# Patient Record
Sex: Male | Born: 1935 | Race: White | Hispanic: No | Marital: Single | Smoking: Never smoker
Health system: Southern US, Community
[De-identification: ages and names within clinical notes are randomized; demographics above are authoritative.]

## PROBLEM LIST (undated history)

## (undated) ENCOUNTER — Emergency Department (HOSPITAL_COMMUNITY): Payer: Medicare Other | Source: Home / Self Care

## (undated) DIAGNOSIS — K759 Inflammatory liver disease, unspecified: Secondary | ICD-10-CM

## (undated) DIAGNOSIS — C61 Malignant neoplasm of prostate: Secondary | ICD-10-CM

## (undated) DIAGNOSIS — E46 Unspecified protein-calorie malnutrition: Secondary | ICD-10-CM

## (undated) DIAGNOSIS — C662 Malignant neoplasm of left ureter: Secondary | ICD-10-CM

## (undated) DIAGNOSIS — Z96649 Presence of unspecified artificial hip joint: Secondary | ICD-10-CM

## (undated) DIAGNOSIS — K5790 Diverticulosis of intestine, part unspecified, without perforation or abscess without bleeding: Secondary | ICD-10-CM

## (undated) DIAGNOSIS — K635 Polyp of colon: Secondary | ICD-10-CM

## (undated) DIAGNOSIS — M1990 Unspecified osteoarthritis, unspecified site: Secondary | ICD-10-CM

## (undated) DIAGNOSIS — I700 Atherosclerosis of aorta: Secondary | ICD-10-CM

## (undated) DIAGNOSIS — I10 Essential (primary) hypertension: Secondary | ICD-10-CM

## (undated) DIAGNOSIS — N471 Phimosis: Secondary | ICD-10-CM

## (undated) DIAGNOSIS — N179 Acute kidney failure, unspecified: Secondary | ICD-10-CM

## (undated) DIAGNOSIS — I6529 Occlusion and stenosis of unspecified carotid artery: Secondary | ICD-10-CM

## (undated) DIAGNOSIS — E785 Hyperlipidemia, unspecified: Secondary | ICD-10-CM

## (undated) DIAGNOSIS — J439 Emphysema, unspecified: Secondary | ICD-10-CM

## (undated) DIAGNOSIS — R200 Anesthesia of skin: Secondary | ICD-10-CM

## (undated) HISTORY — DX: Phimosis: N47.1

## (undated) HISTORY — PX: OTHER SURGICAL HISTORY: SHX169

## (undated) HISTORY — DX: Essential (primary) hypertension: I10

## (undated) HISTORY — DX: Atherosclerosis of aorta: I70.0

## (undated) HISTORY — DX: Unspecified protein-calorie malnutrition: E46

## (undated) HISTORY — DX: Polyp of colon: K63.5

## (undated) HISTORY — DX: Emphysema, unspecified: J43.9

## (undated) HISTORY — PX: CATARACT EXTRACTION W/ INTRAOCULAR LENS  IMPLANT, BILATERAL: SHX1307

## (undated) HISTORY — DX: Acute kidney failure, unspecified: N17.9

## (undated) HISTORY — DX: Occlusion and stenosis of unspecified carotid artery: I65.29

## (undated) HISTORY — DX: Diverticulosis of intestine, part unspecified, without perforation or abscess without bleeding: K57.90

## (undated) HISTORY — PX: PROSTATE SURGERY: SHX751

## (undated) HISTORY — PX: URINARY SPHINCTER IMPLANT: SHX2624

## (undated) HISTORY — PX: VARICOSE VEIN SURGERY: SHX832

## (undated) HISTORY — PX: SKIN CANCER EXCISION: SHX779

## (undated) HISTORY — DX: Presence of unspecified artificial hip joint: Z96.649

---

## 1958-04-12 HISTORY — PX: INGUINAL HERNIA REPAIR: SUR1180

## 1999-04-13 DIAGNOSIS — C61 Malignant neoplasm of prostate: Secondary | ICD-10-CM

## 1999-04-13 HISTORY — DX: Malignant neoplasm of prostate: C61

## 2005-02-23 ENCOUNTER — Ambulatory Visit: Payer: Self-pay | Admitting: Internal Medicine

## 2005-03-01 ENCOUNTER — Ambulatory Visit: Payer: Self-pay | Admitting: Internal Medicine

## 2005-03-16 ENCOUNTER — Ambulatory Visit: Payer: Self-pay | Admitting: Internal Medicine

## 2005-08-10 ENCOUNTER — Ambulatory Visit: Payer: Self-pay | Admitting: Internal Medicine

## 2005-08-28 ENCOUNTER — Ambulatory Visit: Payer: Self-pay | Admitting: Internal Medicine

## 2005-08-31 ENCOUNTER — Ambulatory Visit: Payer: Self-pay | Admitting: Internal Medicine

## 2005-09-03 ENCOUNTER — Ambulatory Visit: Payer: Self-pay | Admitting: Internal Medicine

## 2005-10-04 ENCOUNTER — Ambulatory Visit: Payer: Self-pay | Admitting: Internal Medicine

## 2005-12-03 ENCOUNTER — Ambulatory Visit: Payer: Self-pay | Admitting: Internal Medicine

## 2006-02-08 ENCOUNTER — Ambulatory Visit: Payer: Self-pay | Admitting: Internal Medicine

## 2006-02-14 ENCOUNTER — Ambulatory Visit: Payer: Self-pay | Admitting: Internal Medicine

## 2006-03-09 ENCOUNTER — Ambulatory Visit: Payer: Self-pay | Admitting: Cardiovascular Disease

## 2006-07-18 ENCOUNTER — Ambulatory Visit: Payer: Self-pay | Admitting: Internal Medicine

## 2006-09-07 ENCOUNTER — Ambulatory Visit: Payer: Self-pay | Admitting: Internal Medicine

## 2006-11-03 ENCOUNTER — Encounter: Payer: Self-pay | Admitting: Internal Medicine

## 2006-11-04 DIAGNOSIS — I868 Varicose veins of other specified sites: Secondary | ICD-10-CM

## 2006-11-04 DIAGNOSIS — Z8546 Personal history of malignant neoplasm of prostate: Secondary | ICD-10-CM

## 2006-11-04 DIAGNOSIS — Z87898 Personal history of other specified conditions: Secondary | ICD-10-CM | POA: Insufficient documentation

## 2006-11-04 DIAGNOSIS — N318 Other neuromuscular dysfunction of bladder: Secondary | ICD-10-CM

## 2006-11-04 HISTORY — DX: Personal history of malignant neoplasm of prostate: Z85.46

## 2006-11-15 ENCOUNTER — Ambulatory Visit: Payer: Self-pay | Admitting: Internal Medicine

## 2006-11-16 ENCOUNTER — Ambulatory Visit: Payer: Self-pay | Admitting: Internal Medicine

## 2006-11-16 LAB — CONVERTED CEMR LAB
ALT: 16 units/L (ref 0–53)
CO2: 30 meq/L (ref 19–32)
Calcium: 9.3 mg/dL (ref 8.4–10.5)
Chloride: 107 meq/L (ref 96–112)
Cholesterol: 197 mg/dL (ref 0–200)
Creatinine, Ser: 1.1 mg/dL (ref 0.4–1.5)
Glucose, Bld: 105 mg/dL — ABNORMAL HIGH (ref 70–99)
LDL Cholesterol: 135 mg/dL — ABNORMAL HIGH (ref 0–99)
Potassium: 4.3 meq/L (ref 3.5–5.1)
Sodium: 142 meq/L (ref 135–145)
Total CHOL/HDL Ratio: 4.7

## 2006-11-25 ENCOUNTER — Ambulatory Visit: Payer: Self-pay | Admitting: Internal Medicine

## 2006-12-06 ENCOUNTER — Ambulatory Visit: Payer: Self-pay | Admitting: Internal Medicine

## 2006-12-06 LAB — CONVERTED CEMR LAB
BUN: 13 mg/dL (ref 6–23)
CO2: 32 meq/L (ref 19–32)
Calcium: 9.5 mg/dL (ref 8.4–10.5)
Creatinine, Ser: 1.3 mg/dL (ref 0.4–1.5)
GFR calc Af Amer: 69 mL/min
Glucose, Bld: 93 mg/dL (ref 70–99)
Potassium: 4.3 meq/L (ref 3.5–5.1)

## 2007-01-12 ENCOUNTER — Ambulatory Visit: Payer: Self-pay | Admitting: Internal Medicine

## 2007-01-12 LAB — CONVERTED CEMR LAB
CO2: 34 meq/L — ABNORMAL HIGH (ref 19–32)
Calcium: 9.4 mg/dL (ref 8.4–10.5)
Chloride: 106 meq/L (ref 96–112)
GFR calc Af Amer: 69 mL/min
Glucose, Bld: 118 mg/dL — ABNORMAL HIGH (ref 70–99)
Sodium: 144 meq/L (ref 135–145)

## 2007-02-08 ENCOUNTER — Encounter: Payer: Self-pay | Admitting: Internal Medicine

## 2007-02-08 DIAGNOSIS — I10 Essential (primary) hypertension: Secondary | ICD-10-CM

## 2007-02-09 ENCOUNTER — Ambulatory Visit: Payer: Self-pay | Admitting: Internal Medicine

## 2007-02-10 ENCOUNTER — Encounter: Payer: Self-pay | Admitting: Internal Medicine

## 2007-02-14 ENCOUNTER — Telehealth: Payer: Self-pay | Admitting: Internal Medicine

## 2007-07-26 ENCOUNTER — Encounter: Payer: Self-pay | Admitting: Internal Medicine

## 2007-08-28 ENCOUNTER — Ambulatory Visit: Payer: Self-pay | Admitting: Internal Medicine

## 2007-08-28 DIAGNOSIS — Z860100 Personal history of colon polyps, unspecified: Secondary | ICD-10-CM

## 2007-08-28 HISTORY — DX: Personal history of colonic polyps: Z86.010

## 2007-08-28 HISTORY — DX: Personal history of colon polyps, unspecified: Z86.0100

## 2007-09-01 ENCOUNTER — Telehealth: Payer: Self-pay | Admitting: Internal Medicine

## 2007-09-05 ENCOUNTER — Ambulatory Visit: Payer: Self-pay | Admitting: Internal Medicine

## 2007-09-05 DIAGNOSIS — I83893 Varicose veins of bilateral lower extremities with other complications: Secondary | ICD-10-CM

## 2007-09-05 HISTORY — DX: Varicose veins of bilateral lower extremities with other complications: I83.893

## 2007-09-13 ENCOUNTER — Ambulatory Visit: Payer: Self-pay | Admitting: Internal Medicine

## 2007-09-15 ENCOUNTER — Ambulatory Visit: Payer: Self-pay | Admitting: Internal Medicine

## 2007-09-22 ENCOUNTER — Ambulatory Visit: Payer: Self-pay | Admitting: Internal Medicine

## 2007-09-22 LAB — CONVERTED CEMR LAB
HDL: 44.1 mg/dL (ref >39.0)
Potassium: 4.4 meq/L (ref 3.5–5.1)
Sodium: 140 meq/L (ref 135–145)
Total CHOL/HDL Ratio: 4.3
VLDL: 16 mg/dL (ref 0–40)

## 2007-09-27 ENCOUNTER — Encounter: Payer: Self-pay | Admitting: Internal Medicine

## 2007-09-28 ENCOUNTER — Ambulatory Visit: Payer: Self-pay | Admitting: Internal Medicine

## 2007-10-30 ENCOUNTER — Ambulatory Visit: Payer: Self-pay | Admitting: Internal Medicine

## 2007-10-30 DIAGNOSIS — R32 Unspecified urinary incontinence: Secondary | ICD-10-CM | POA: Insufficient documentation

## 2007-11-03 ENCOUNTER — Telehealth: Payer: Self-pay | Admitting: Internal Medicine

## 2007-12-04 ENCOUNTER — Ambulatory Visit: Payer: Self-pay | Admitting: Internal Medicine

## 2007-12-04 DIAGNOSIS — R209 Unspecified disturbances of skin sensation: Secondary | ICD-10-CM

## 2007-12-04 HISTORY — DX: Unspecified disturbances of skin sensation: R20.9

## 2007-12-04 LAB — CONVERTED CEMR LAB
Folate: >20 ng/mL
TSH: 0.58 microintl units/mL (ref 0.35–5.50)

## 2007-12-05 ENCOUNTER — Telehealth: Payer: Self-pay | Admitting: Internal Medicine

## 2008-03-11 ENCOUNTER — Ambulatory Visit: Payer: Self-pay | Admitting: Internal Medicine

## 2008-03-11 DIAGNOSIS — N476 Balanoposthitis: Secondary | ICD-10-CM | POA: Insufficient documentation

## 2008-03-11 DIAGNOSIS — N471 Phimosis: Secondary | ICD-10-CM

## 2008-03-11 DIAGNOSIS — N478 Other disorders of prepuce: Secondary | ICD-10-CM

## 2008-09-20 ENCOUNTER — Ambulatory Visit: Payer: Self-pay | Admitting: Internal Medicine

## 2008-09-23 ENCOUNTER — Ambulatory Visit: Payer: Self-pay | Admitting: Internal Medicine

## 2008-09-23 LAB — CONVERTED CEMR LAB
Alkaline Phosphatase: 55 units/L (ref 39–117)
BUN: 14 mg/dL (ref 6–23)
Basophils Absolute: 0.1 10*3/uL (ref 0.0–0.1)
Bilirubin, Direct: 0.2 mg/dL (ref 0.0–0.3)
CO2: 32 meq/L (ref 19–32)
Chloride: 109 meq/L (ref 96–112)
Cholesterol: 170 mg/dL (ref 0–200)
Eosinophils Absolute: 0.4 10*3/uL (ref 0.0–0.7)
Eosinophils Relative: 6 % — ABNORMAL HIGH (ref 0.0–5.0)
GFR calc non Af Amer: 68.51 mL/min (ref >60)
Glucose, Bld: 103 mg/dL — ABNORMAL HIGH (ref 70–99)
HCT: 40.8 % (ref 39.0–52.0)
Hemoglobin: 14.3 g/dL (ref 13.0–17.0)
LDL Cholesterol: 109 mg/dL — ABNORMAL HIGH (ref 0–99)
Lymphocytes Relative: 24.1 % (ref 12.0–46.0)
Lymphs Abs: 1.6 10*3/uL (ref 0.7–4.0)
MCHC: 35.1 g/dL (ref 30.0–36.0)
MCV: 95.2 fL (ref 78.0–100.0)
Monocytes Absolute: 0.3 10*3/uL (ref 0.1–1.0)
Neutro Abs: 4.4 10*3/uL (ref 1.4–7.7)
PSA: 0.01 ng/mL — ABNORMAL LOW (ref 0.10–4.00)
RBC: 4.28 M/uL (ref 4.22–5.81)
Sodium: 143 meq/L (ref 135–145)
Total Bilirubin: 1 mg/dL (ref 0.3–1.2)
Total CHOL/HDL Ratio: 4

## 2008-09-27 ENCOUNTER — Encounter: Payer: Self-pay | Admitting: Internal Medicine

## 2008-10-03 ENCOUNTER — Ambulatory Visit: Payer: Self-pay | Admitting: Internal Medicine

## 2008-10-17 ENCOUNTER — Ambulatory Visit: Payer: Self-pay | Admitting: Internal Medicine

## 2008-10-17 DIAGNOSIS — H269 Unspecified cataract: Secondary | ICD-10-CM

## 2008-10-17 HISTORY — DX: Unspecified cataract: H26.9

## 2008-11-10 DIAGNOSIS — I6529 Occlusion and stenosis of unspecified carotid artery: Secondary | ICD-10-CM

## 2008-11-10 HISTORY — DX: Occlusion and stenosis of unspecified carotid artery: I65.29

## 2008-11-19 ENCOUNTER — Ambulatory Visit: Payer: Self-pay | Admitting: Internal Medicine

## 2008-11-19 DIAGNOSIS — R0989 Other specified symptoms and signs involving the circulatory and respiratory systems: Secondary | ICD-10-CM

## 2008-11-19 HISTORY — DX: Other specified symptoms and signs involving the circulatory and respiratory systems: R09.89

## 2008-11-22 ENCOUNTER — Encounter: Payer: Self-pay | Admitting: Internal Medicine

## 2008-11-22 ENCOUNTER — Ambulatory Visit: Payer: Self-pay

## 2008-11-27 ENCOUNTER — Telehealth: Payer: Self-pay | Admitting: Internal Medicine

## 2008-11-28 ENCOUNTER — Ambulatory Visit: Payer: Self-pay | Admitting: Internal Medicine

## 2008-11-28 DIAGNOSIS — E785 Hyperlipidemia, unspecified: Secondary | ICD-10-CM

## 2008-12-31 ENCOUNTER — Encounter: Payer: Self-pay | Admitting: Internal Medicine

## 2009-02-25 ENCOUNTER — Encounter: Payer: Self-pay | Admitting: Internal Medicine

## 2009-06-10 ENCOUNTER — Encounter: Payer: Self-pay | Admitting: Internal Medicine

## 2009-07-11 ENCOUNTER — Ambulatory Visit: Payer: Self-pay | Admitting: Internal Medicine

## 2009-07-11 DIAGNOSIS — I499 Cardiac arrhythmia, unspecified: Secondary | ICD-10-CM

## 2009-07-11 DIAGNOSIS — L259 Unspecified contact dermatitis, unspecified cause: Secondary | ICD-10-CM | POA: Insufficient documentation

## 2009-07-11 HISTORY — DX: Cardiac arrhythmia, unspecified: I49.9

## 2009-07-21 ENCOUNTER — Encounter (INDEPENDENT_AMBULATORY_CARE_PROVIDER_SITE_OTHER): Payer: Self-pay | Admitting: *Deleted

## 2009-07-30 ENCOUNTER — Telehealth: Payer: Self-pay | Admitting: Internal Medicine

## 2009-07-31 ENCOUNTER — Ambulatory Visit: Payer: Self-pay

## 2009-07-31 ENCOUNTER — Ambulatory Visit (HOSPITAL_COMMUNITY): Admission: RE | Admit: 2009-07-31 | Discharge: 2009-07-31 | Payer: Self-pay | Admitting: Internal Medicine

## 2009-07-31 ENCOUNTER — Encounter: Payer: Self-pay | Admitting: Internal Medicine

## 2009-07-31 ENCOUNTER — Ambulatory Visit: Payer: Self-pay | Admitting: Internal Medicine

## 2009-07-31 ENCOUNTER — Ambulatory Visit: Payer: Self-pay | Admitting: Cardiology

## 2009-07-31 LAB — CONVERTED CEMR LAB
ALT: 28 units/L (ref 0–53)
Alkaline Phosphatase: 46 units/L (ref 39–117)
BUN: 15 mg/dL (ref 6–23)
Bilirubin, Direct: 0.2 mg/dL (ref 0.0–0.3)
Calcium: 9.4 mg/dL (ref 8.4–10.5)
Cholesterol: 118 mg/dL (ref 0–200)
GFR calc non Af Amer: 51.76 mL/min (ref >60)
LDL Cholesterol: 41 mg/dL (ref 0–99)
Potassium: 5 meq/L (ref 3.5–5.1)
Total CHOL/HDL Ratio: 2
Total Protein: 6.6 g/dL (ref 6.0–8.3)
VLDL: 27 mg/dL (ref 0.0–40.0)

## 2009-08-03 ENCOUNTER — Telehealth: Payer: Self-pay | Admitting: Internal Medicine

## 2009-08-05 ENCOUNTER — Ambulatory Visit: Payer: Self-pay | Admitting: Internal Medicine

## 2009-08-05 DIAGNOSIS — R946 Abnormal results of thyroid function studies: Secondary | ICD-10-CM

## 2009-08-05 HISTORY — DX: Abnormal results of thyroid function studies: R94.6

## 2009-08-21 ENCOUNTER — Encounter: Payer: Self-pay | Admitting: Internal Medicine

## 2009-09-12 ENCOUNTER — Encounter: Payer: Self-pay | Admitting: Internal Medicine

## 2009-09-22 ENCOUNTER — Ambulatory Visit: Payer: Self-pay | Admitting: Internal Medicine

## 2009-09-24 ENCOUNTER — Telehealth: Payer: Self-pay | Admitting: Internal Medicine

## 2009-09-24 ENCOUNTER — Ambulatory Visit (HOSPITAL_COMMUNITY): Admission: RE | Admit: 2009-09-24 | Discharge: 2009-09-24 | Payer: Self-pay | Admitting: Urology

## 2009-09-29 ENCOUNTER — Ambulatory Visit: Payer: Self-pay | Admitting: Internal Medicine

## 2009-09-29 DIAGNOSIS — L03039 Cellulitis of unspecified toe: Secondary | ICD-10-CM | POA: Insufficient documentation

## 2009-10-09 ENCOUNTER — Encounter: Payer: Self-pay | Admitting: Internal Medicine

## 2009-11-06 ENCOUNTER — Ambulatory Visit: Payer: Self-pay | Admitting: Internal Medicine

## 2010-01-27 ENCOUNTER — Ambulatory Visit: Payer: Self-pay | Admitting: Internal Medicine

## 2010-03-10 ENCOUNTER — Encounter: Payer: Self-pay | Admitting: Internal Medicine

## 2010-05-12 NOTE — Letter (Signed)
Summary: [facility]  [facility]   Imported By: Lanelle Bal 09/19/2009 13:11:22  _____________________________________________________________________  External Attachment:    Type:   Image     Comment:   External Document

## 2010-05-12 NOTE — Assessment & Plan Note (Signed)
Summary: 3 month fu/dt   Vital Signs:  Patient profile:   77 year old male Height:      70 inches Weight:      135 pounds BMI:     19.44 O2 Sat:      96 % on Room air Temp:     98.1 degrees F oral Pulse rate:   80 / minute Pulse rhythm:   regular Resp:     18 per minute BP sitting:   116 / 54  (right arm) Cuff size:   regular  Vitals Entered By: Glendell Docker CMA (January 27, 2010 3:01 PM)  O2 Flow:  Room air CC: 3 month follow Is Patient Diabetic? No Pain Assessment Patient in pain? no      Comments he decided not to have the surgery done, he going to play by ear regarding surgery, mild shoulder pain in right shoulder, in town for the week, leaving for [place] on Friday   Primary Care Provider:  D. Thomos Lemons DO  CC:  3 month follow.  History of Present Illness: 77 y/o male with hx of prostate ca and htn for f/u doing well. recently returned from vacation which involved significant walking more issues with urinary incontinence    Preventive Screening-Counseling & Management  Alcohol-Tobacco     Smoking Status: never  Allergies: 1)  ! * Lisinopril  Past History:  Past Medical History: Prostate cancer, hx of - 2001 Hypertension  Colon Polyps      Chronic urinary incontinence after prostate surgury  Cataracts   Diverticulosis Phimosis Carotid artery stenosis - 11/2008 (bilateral 40-59% stenosis)  Past Surgical History: Inguinal herniorrhaphy Varicose Vein sx.     s/p laparoscopic prostate surgury  s/p skin cancer right foot - non-melanoma       Social History: Patient is single, has never been married.  Does not have any children.  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  No history of excessive alcohol use.  Occasionally smokes a cigar. Spends winters in [place]      Physical Exam  General:  alert, well-developed, and well-nourished.   Lungs:  normal respiratory effort  and normal breath sounds.   Heart:  normal rate, regular rhythm, and no gallop.   Abdomen:  soft, non-tender, and normal bowel sounds.   Extremities:  No lower extremity edema  Neurologic:  cranial nerves II-XII intact.   Psych:  normally interactive, good eye contact, not anxious appearing, and not depressed appearing.     Impression & Recommendations:  Problem # 1:  HYPERTENSION (ICD-401.9)  His updated medication list for this problem includes:    Bisoprolol Fumarate 5 Mg Tabs (Bisoprolol fumarate) .[name]... 1/2 by mouth once daily  BP today: 116/54 Prior BP: 118/70 (11/06/2009)  Prior 10 Yr Risk Heart Disease: 18 % (11/28/2008)  Labs Reviewed: K+: 5.0 (07/31/2009) Creat: : 1.4 (07/31/2009)   Chol: 118 (07/31/2009)   HDL: 49.80 (07/31/2009)   LDL: 41 (07/31/2009)   TG: 135.0 (07/31/2009)  Problem # 2:  HYPERLIPIDEMIA (ICD-272.4) Assessment: Unchanged  His updated medication list for this problem includes:    Pravastatin Sodium 20 Mg Tabs (Pravastatin sodium) ..... One by mouth once daily  Labs Reviewed: SGOT: 34 (07/31/2009)   SGPT: 28 (07/31/2009)  Lipid Goals: Chol Goal: 200 (11/28/2008)   HDL Goal: 40 (11/28/2008)   LDL Goal: 100 (11/28/2008)   TG Goal: 150 (11/28/2008)  Prior 10 Yr Risk Heart  Disease: 18 % (11/28/2008)   HDL:49.80 (07/31/2009), 48.30 (09/23/2008)  LDL:41 (07/31/2009), 109 (34/74/2595)  Chol:118 (07/31/2009), 170 (09/23/2008)  Trig:135.0 (07/31/2009), 65.0 (09/23/2008)  Problem # 3:  URINARY INCONTINENCE (ICD-788.30) some worsening with prolonged walking.  he is think about surgery again. try kegel exercises again pt advised to avoid caffeinated beverages  Complete Medication List: 1)  Ditropan Xl 15 Mg Xr24h-tab (Oxybutynin chloride) .... Take 1 tablet by mouth once a day 2)  Aspirin Ec 81 Mg Tbec (Aspirin) .... One by mouth once daily 3)  Bisoprolol Fumarate 5 Mg Tabs (Bisoprolol fumarate) .... 1/2 by mouth once daily 4)  Pravastatin Sodium 20 Mg  Tabs (Pravastatin sodium) .... One by mouth once daily 5)  Fish Oil 500 Mg Caps (Omega-3 fatty acids) .... Take 1 capsule by mouth two times a day 6)  Senior Multivitamin Plus Tabs (Multiple vitamins-minerals) .... Take 1 tablet by mouth once a day Prescriptions: PRAVASTATIN SODIUM 20 MG TABS (PRAVASTATIN SODIUM) one by mouth once daily  #180 x 1   Entered and Authorized by:   D. Thomos Lemons DO   Signed by:   D. Thomos Lemons DO on 01/27/2010   Method used:   Print then Give to Patient   RxID:   [id number] BISOPROLOL FUMARATE 5 MG TABS (BISOPROLOL FUMARATE) 1/2 by mouth once daily  #90 x 1   Entered and Authorized by:   D. Thomos Lemons DO   Signed by:   D. Thomos Lemons DO on 01/27/2010   Method used:   Print then Give to Patient   RxID:   [id number] DITROPAN XL 15 MG XR24H-TAB (OXYBUTYNIN CHLORIDE) Take 1 tablet by mouth once a day  #180 x 1   Entered and Authorized by:   D. Thomos Lemons DO   Signed by:   D. Thomos Lemons DO on 01/27/2010   Method used:   Print then Give to Patient   RxID:   [id number]    Orders Added: 1)  Est. Patient Level III [76283]     Current Allergies (reviewed today): ! * LISINOPRIL

## 2010-05-12 NOTE — Letter (Signed)
Summary: Appointment - Reschedule  [facility], Main Office  [address]   [place], [place] [address]   Phone: [phone]  Fax: [phone]     July 21, 2009 MRN: [id number]   Jeremy Kelley [address] [place], [place]  [address]   Dear [name],   Due to a change in our office schedule, your appointment on  08/08/09 at 2:45 p.m. must be changed.  It is very important that we reach you to reschedule this appointment. We look forward to participating in your health care needs. Please contact us at the number listed above at your earliest convenience to reschedule this appointment.     Sincerely,   Ruel Favors Scheduling Team

## 2010-05-12 NOTE — Assessment & Plan Note (Signed)
Summary: 1 month follow up/mhf--Rm 3   Vital Signs:  Patient profile:   77 year old male Height:      70 inches Weight:      141.50 pounds BMI:     20.38 O2 Sat:      97 % on Room air Temp:     98.7 degrees F oral Pulse rate:   60 / minute Pulse rhythm:   regular Resp:     16 per minute BP sitting:   118 / 70  (right arm) Cuff size:   regular  Vitals Entered By: Mervin Kung CMA Duncan Dull) (November 06, 2009 9:57 AM)  O2 Flow:  Room air  Primary Care Provider:  D. Thomos Lemons DO  CC:  Room 3  1 month follow up. Pt needs refills on Ditorpan and Bisoprolol and Pravastatin.[name]  History of Present Illness:  Hypertension Follow-Up      This is an 77 year old man who presents for Hypertension follow-up.  The patient denies lightheadedness and edema.  The patient denies the following associated symptoms: chest pain.  Compliance with medications (by patient report) has been near 100%.  The patient reports that dietary compliance has been fair.    Preventive Screening-Counseling & Management  Alcohol-Tobacco     Smoking Status: never  Allergies: 1)  ! * Lisinopril  Past History:  Past Medical History: Prostate cancer, hx of - 2001 Hypertension  Colon Polyps     Chronic urinary incontinence after prostate surgury  Cataracts   Diverticulosis Phimosis Carotid artery stenosis - 11/2008 (bilateral 40-59% stenosis)  Past Surgical History: Inguinal herniorrhaphy Varicose Vein sx.    s/p laparoscopic prostate surgury  s/p skin cancer right foot - non-melanoma       Social History: Patient is single, has never been married.  Does not have any children.  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  No history of excessive alcohol use.  Occasionally smokes a cigar. Spends winters in [place]     Physical Exam  General:  alert and well-developed.   Lungs:  normal respiratory effort and normal breath sounds.     Heart:  normal rate, regular rhythm, and no gallop.   Extremities:  No lower extremity edema    Impression & Recommendations:  Problem # 1:  HYPERTENSION (ICD-401.9) Assessment Unchanged  His updated medication list for this problem includes:    Bisoprolol Fumarate 5 Mg Tabs (Bisoprolol fumarate) .[name]... 1/2 by mouth once daily  BP today: 118/70 Prior BP: 118/60 (09/29/2009)  Prior 10 Yr Risk Heart Disease: 18 % (11/28/2008)  Labs Reviewed: K+: 5.0 (07/31/2009) Creat: : 1.4 (07/31/2009)   Chol: 118 (07/31/2009)   HDL: 49.80 (07/31/2009)   LDL: 41 (07/31/2009)   TG: 135.0 (07/31/2009)  Problem # 2:  ABNORMAL THYROID FUNCTION TESTS (ICD-794.5) TSH normalized.  possible transient thyroiditis.  repeat TSH in 6 months  Problem # 3:  URINARY INCONTINENCE (ICD-788.30) he decided against surgery.  continue detrol  Complete Medication List: 1)  Ditropan Xl 15 Mg Xr24h-tab (Oxybutynin chloride) .... Take 1 tablet by mouth once a day 2)  Aspirin Ec 81 Mg Tbec (Aspirin) .... One by mouth once daily 3)  Bisoprolol Fumarate 5 Mg Tabs (Bisoprolol fumarate) .... 1/2 by mouth once daily 4)  Pravastatin Sodium 20 Mg Tabs (Pravastatin sodium) .... One by mouth once daily 5)  Triamcinolone Acetonide 0.1 % Oint (Triamcinolone acetonide) .... Apply two times a  day 6)  Fish Oil 500 Mg Caps (Omega-3 fatty acids) .... Take 1 capsule by mouth two times a day 7)  Senior Multivitamin Plus Tabs (Multiple vitamins-minerals) .... Take 1 tablet by mouth once a day 8)  Prednisolone Acetate 1 % Susp (Prednisolone acetate) .[name].. 1 drop in right eye twice a day. 9)  Acyclovir 400 Mg Tabs (Acyclovir) .... Take 1 tablet by mouth two times a day 10)  Zostavax 54098 Unt/0.108ml Solr (Zoster vaccine live) .... Administer vaccine x 1  Patient Instructions: 1)  Please schedule a follow-up appointment in 6 - 12 months Prescriptions: PRAVASTATIN SODIUM 20 MG TABS (PRAVASTATIN SODIUM) one by mouth once daily  #180 x 1    Entered and Authorized by:   D. Thomos Lemons DO   Signed by:   D. Thomos Lemons DO on 11/06/2009   Method used:   [facility] to        [facility] [address] [phone]* (retail)       [address]       [place], [place]  [address]       Ph: [phone] or [phone]       Fax: [phone]   RxID:   [id number] BISOPROLOL FUMARATE 5 MG TABS (BISOPROLOL FUMARATE) 1/2 by mouth once daily  #90 x 1   Entered and Authorized by:   D. Thomos Lemons DO   Signed by:   D. Thomos Lemons DO on 11/06/2009   Method used:   [facility]        [facility] [address] [phone]* (retail)       [address]       [place], [place]  [address]       Ph: [phone] or [phone]       Fax: [phone]   RxID:   [id number] DITROPAN XL 15 MG XR24H-TAB (OXYBUTYNIN CHLORIDE) Take 1 tablet by mouth once a day  #180 x 1   Entered and Authorized by:   D. Thomos Lemons DO   Signed by:   D. Thomos Lemons DO on 11/06/2009   Method used:   [facility]        [facility] [address] [phone]* (retail)       [address]       [place], [place]  [address]       Ph: [phone] or [phone]       Fax: [phone]   RxID:   [id number] ZOSTAVAX 19400 UNT/0.65ML SOLR (ZOSTER VACCINE LIVE) administer vaccine x 1  #1 x 0   Entered and Authorized by:   D. Thomos Lemons DO   Signed by:   D. Thomos Lemons DO on 11/06/2009   Method used:   Print then Give to Patient   RxID:   [id number]   Current Allergies (reviewed today): ! * LISINOPRIL  CC: Room 3  1 month follow up. Pt needs refills on Ditorpan, Bisoprolol and Pravastatin. Is Patient Diabetic? No Comments Pt states he has completed Triamcinolone ointment and Cefuroxime. Pt states that we stopped his Levothyroxine.  Nicki Guadalajara Fergerson CMA Duncan Dull)  November 06, 2009 10:05 AM

## 2010-05-12 NOTE — Progress Notes (Signed)
Summary: Cataract Surgery  Phone Note Call from Patient Call back at [phone]   Caller: Patient Summary of Call: patient called and left voice message stating he wanted to make Dr Artist Pais aware that he is scheduled for cataract surgery on his left eye on Aug 13, 2009 with Dr Burgess Estelle Initial call taken by: Glendell Docker CMA,  July 30, 2009 1:37 PM  Follow-up for Phone Call        noted Follow-up by: D. Thomos Lemons DO,  August 03, 2009 4:25 PM

## 2010-05-12 NOTE — Letter (Signed)
Summary: [facility]  [facility]   Imported By: Lanelle Bal 07/30/2009 09:10:59  _____________________________________________________________________  External Attachment:    Type:   Image     Comment:   External Document

## 2010-05-12 NOTE — Procedures (Signed)
Summary: Marcell Barlow MD  Marcell Barlow MD   Imported By: Lanelle Bal 07/17/2009 09:06:40  _____________________________________________________________________  External Attachment:    Type:   Image     Comment:   External Document

## 2010-05-12 NOTE — Progress Notes (Signed)
Summary: procedure cancelled   Phone Note Call from Patient Call back at Home Phone [phone]   Caller: patient live Call For: Jeremy Kelley  Summary of Call: patient has cancelled the procedure he was planning to have done on Friday.   Initial call taken by: Roselle Locus,  September 24, 2009 3:33 PM  Follow-up for Phone Call        noted Follow-up by: D. Thomos Lemons DO,  September 26, 2009 1:17 PM

## 2010-05-12 NOTE — Assessment & Plan Note (Signed)
Summary: discuss thyroid/mhf   Vital Signs:  Patient profile:   77 year old male Height:      70 inches Weight:      139 pounds BMI:     20.02 O2 Sat:      98 % on Room air Temp:     98.1 degrees F oral Pulse rate:   56 / minute Pulse rhythm:   regular Resp:     16 per minute BP sitting:   132 / 90  (left arm) Cuff size:   regular  Vitals Entered By: Glendell Docker CMA (August 05, 2009 10:47 AM)  O2 Flow:  Room air CC: Rm 3- Discuss Thyroid results   Primary Care Provider:  Dondra Spry DO  CC:  Rm 3- Discuss Thyroid results.  History of Present Illness: 77 y/o white male for follow up prev blood work showed underactive thyroid he denies symptoms of hypothyroidism  he is scheduled for left cataract surgery  also considering sling procedure for uninary incontinence  Allergies: 1)  ! * Lisinopril  Past History:  Past Medical History: Prostate cancer, hx of - 2001 Hypertension  Colon Polyps   Chronic urinary incontinence after prostate surgury  Cataracts   Diverticulosis Phimosis Carotid artery stenosis - 11/2008 (bilateral 40-59% stenosis)  Past Surgical History: Inguinal herniorrhaphy Varicose Vein sx.  s/p laparoscopic prostate surgury  s/p skin cancer right foot - non-melanoma       Social History: Patient is single, has never been married.  Does not have any children.  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  No history of excessive alcohol use.  Occasionally smokes a cigar. Spends winters in [place]   Physical Exam  General:  alert and underweight appearing.   Lungs:  normal respiratory effort and normal breath sounds.   Heart:  normal rate, regular rhythm, and no gallop.   Extremities:  No lower extremity edema  Neurologic:  cranial nerves II-XII intact and gait normal.     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   start thyroid replacement.  check thyroid antibodies  His updated medication list for this problem includes:    Levothyroxine Sodium 25 Mcg Tabs (Levothyroxine sodium) ..... One by mouth once daily  Labs Reviewed: TSH: 7.54 (07/31/2009)    Chol: 118 (07/31/2009)   HDL: 49.80 (07/31/2009)   LDL: 41 (07/31/2009)   TG: 135.0 (07/31/2009)  Problem # 2:  CAROTID BRUIT (ICD-785.9) continue aspirin and statin therapy  Complete Medication List: 1)  Ditropan Xl 15 Mg Xr24h-tab (Oxybutynin chloride) .... Take 1 tablet by mouth once a day 2)  Aspirin Ec 81 Mg Tbec (Aspirin) .... One by mouth once daily 3)  Bisoprolol Fumarate 5 Mg Tabs (Bisoprolol fumarate) .... 1/2 by mouth once daily 4)  Pravastatin Sodium 20 Mg Tabs (Pravastatin sodium) .... One by mouth once daily 5)  Triamcinolone Acetonide 0.1 % Oint (Triamcinolone acetonide) .... Apply two times a day 6)  Levothyroxine Sodium 25 Mcg Tabs (Levothyroxine sodium) .... One by mouth once daily  Patient Instructions: 1)  Please schedule a follow-up appointment in 2 months. 2)  TSH prior to visit, ICD-9: 244.90 3)  Thyroid antibodies:  244.90 4)  Please return for lab work one (1) week before your next appointment.   Current Allergies (reviewed today): ! * LISINOPRIL

## 2010-05-12 NOTE — Assessment & Plan Note (Signed)
Summary: prior to surgery per Darlene/mhf--Rm 3   Vital Signs:  Patient profile:   77 year old male Height:      70 inches Weight:      138 pounds BMI:     19.87 Temp:     97.8 degrees F Pulse rate:   60 / minute Pulse rhythm:   regular Resp:     16 per minute BP sitting:   130 / 88  (right arm) Cuff size:   regular  Vitals Entered By: Mervin Kung CMA (September 22, 2009 11:16 AM) Is Patient Diabetic? No   Primary Care Provider:  Dondra Spry DO   History of Present Illness:  77 y/o white male for pre op visit.  pt scheduled for sling procedure for urinary incontinence. he denies chest pain or SOB.  no hx of heart disease.  bp is well controlled. pt on pravastatin for non critical carotid stenosis  Allergies: 1)  ! * Lisinopril  Past History:  Past Medical History: Prostate cancer, hx of - 2001 Hypertension  Colon Polyps    Chronic urinary incontinence after prostate surgury  Cataracts   Diverticulosis Phimosis Carotid artery stenosis - 11/2008 (bilateral 40-59% stenosis)  Past Surgical History: Inguinal herniorrhaphy Varicose Vein sx.   s/p laparoscopic prostate surgury  s/p skin cancer right foot - non-melanoma   Cataract Surgery, left eye 08/13/09 by Dr Burgess Estelle     Social History: Patient is single, has never been married.  Does not have any children.  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  No history of excessive alcohol use.  Occasionally smokes a cigar. Spends winters in [place]    Physical Exam  General:  alert, well-developed, and well-nourished.   Neck:  supple and no masses.  no bruit Lungs:  normal respiratory effort and normal breath sounds.   Heart:  normal rate, regular rhythm, and no gallop.   Extremities:  No lower extremity edema  Neurologic:  cranial nerves II-XII intact and gait normal.   Psych:  normally interactive, good eye contact, not anxious appearing,  and not depressed appearing.     Impression & Recommendations:  Problem # 1:  PREOPERATIVE EXAMINATION (ICD-V72.84) 77 y/o with hx of prostate cancer with incontinence scheduled for sling procedure.  Pt low cardiac risk for surgery.  Prev EKGs from cardiologist in [place] reviewed.  no further cardiac testing recommended.    we discussed potential recovery time.  he will make travel plans accordingly  Problem # 2:  HYPOTHYROIDISM (ICD-244.9)  His updated medication list for this problem includes:    Levothyroxine Sodium 25 Mcg Tabs (Levothyroxine sodium) ..... One by mouth once daily  Orders: T-TSH ([address])  Complete Medication List: 1)  Ditropan Xl 15 Mg Xr24h-tab (Oxybutynin chloride) .... Take 1 tablet by mouth once a day 2)  Aspirin Ec 81 Mg Tbec (Aspirin) .... One by mouth once daily 3)  Bisoprolol Fumarate 5 Mg Tabs (Bisoprolol fumarate) .... 1/2 by mouth once daily 4)  Pravastatin Sodium 20 Mg Tabs (Pravastatin sodium) .... One by mouth once daily 5)  Triamcinolone Acetonide 0.1 % Oint (Triamcinolone acetonide) .... Apply two times a day 6)  Levothyroxine Sodium 25 Mcg Tabs (Levothyroxine sodium) .... One by mouth once daily 7)  Cefuroxime Axetil 500 Mg Tabs (Cefuroxime axetil) .... One by mouth two times a day  Patient Instructions: 1)  Please schedule a follow-up appointment in 3  months.  Vital Signs:  Patient Profile:   77 year old male Height:     70 inches Weight:      138 pounds BMI:     19.87 Temp:     97.8 degrees F Pulse rate:   60 / minute Pulse rhythm:   regular Resp:     16 per minute BP sitting:   130 / 88 Cuff size:   regular

## 2010-05-12 NOTE — Assessment & Plan Note (Signed)
Summary: 2 month follow up/mhf   Vital Signs:  Patient profile:   77 year old male Height:      70 inches Weight:      137.75 pounds BMI:     19.84 O2 Sat:      97 % on Room air Temp:     98.0 degrees F oral Pulse rate:   63 / minute Pulse rhythm:   regular Resp:     18 per minute BP sitting:   118 / 60  (right arm) Cuff size:   regular  Vitals Entered By: Glendell Docker CMA (September 29, 2009 11:14 AM)  O2 Flow:  Room air CC: Rm 3- 2 Month Follow up Comments evaluated for left toe infections given rx for cefadroxil, it has been resolved, but yesterday it has started to bother him today   Primary Care Provider:  DThomos Lemons DO  CC:  Rm 3- 2 Month Follow up.  History of Present Illness: 77 y/o white male for f/u pt had second thoughts about sling procedure he decided to cancel surgery  he c/o left great red and slightly painful   Allergies: 1)  ! * Lisinopril  Past History:  Past Medical History: Prostate cancer, hx of - 2001 Hypertension  Colon Polyps    Chronic urinary incontinence after prostate surgury  Cataracts   Diverticulosis Phimosis Carotid artery stenosis - 11/2008 (bilateral 40-59% stenosis)  Past Surgical History: Inguinal herniorrhaphy Varicose Vein sx.   s/p laparoscopic prostate surgury  s/p skin cancer right foot - non-melanoma       Physical Exam  General:  alert, well-developed, and well-nourished.   Lungs:  normal respiratory effort and normal breath sounds.   Heart:  normal rate, regular rhythm, and no gallop.   Extremities:  No lower extremity edema  Skin:  mild redness of nail back of left great toe no drainage   Impression & Recommendations:  Problem # 1:  PARONYCHIA, LEFT GREAT TOE (ICD-681.11) Pt with mild paronychia.  soak in salt water two times a day and take abx as directed Patient advised to call office if symptoms persist or worsen.  His updated medication list for this problem includes:    Cefuroxime Axetil 500  Mg Tabs (Cefuroxime axetil) ..... One by mouth two times a day  Problem # 2:  HYPOTHYROIDISM (ICD-244.9) TSH now suppressed.  stop thyroid replacement  His updated medication list for this problem includes:    Levothyroxine Sodium 25 Mcg Tabs (Levothyroxine sodium) ..... One by mouth once daily  Labs Reviewed: TSH: 0.338 (09/22/2009)    Chol: 118 (07/31/2009)   HDL: 49.80 (07/31/2009)   LDL: 41 (07/31/2009)   TG: 135.0 (07/31/2009)  Complete Medication List: 1)  Ditropan Xl 15 Mg Xr24h-tab (Oxybutynin chloride) .... Take 1 tablet by mouth once a day 2)  Aspirin Ec 81 Mg Tbec (Aspirin) .... One by mouth once daily 3)  Bisoprolol Fumarate 5 Mg Tabs (Bisoprolol fumarate) .... 1/2 by mouth once daily 4)  Pravastatin Sodium 20 Mg Tabs (Pravastatin sodium) .... One by mouth once daily 5)  Triamcinolone Acetonide 0.1 % Oint (Triamcinolone acetonide) .... Apply two times a day 6)  Levothyroxine Sodium 25 Mcg Tabs (Levothyroxine sodium) .... One by mouth once daily 7)  Cefuroxime Axetil 500 Mg Tabs (Cefuroxime axetil) .... One by mouth two times a day  Patient Instructions: 1)  Patient advised to call office if symptoms persist or worsen. 2)  TSH, Free T4 and Thyroid antibodies before next  OV  244.9 Prescriptions: CEFUROXIME AXETIL 500 MG TABS (CEFUROXIME AXETIL) one by mouth two times a day  #20 x 0   Entered and Authorized by:   D. Thomos Lemons DO   Signed by:   D. Thomos Lemons DO on 09/29/2009   Method used:   [facility] to        [facility] [address] [phone]* (retail)       [address]       [place], [place]  [address]       Ph: [phone] or [phone]       Fax: [phone]   RxID:   [id number]   Current Allergies (reviewed today): ! * LISINOPRIL

## 2010-05-12 NOTE — Progress Notes (Signed)
Summary: Test Results  Phone Note Outgoing Call   Summary of Call: call pt - blood test shows underactive thyroid.  I suggest he start thyroid medication.  see rx instruct pt to take thyroid medication at bedtime on empty stomach Initial call taken by: D. Thomos Lemons DO,  August 03, 2009 4:27 PM  Follow-up for Phone Call        call placed to patient, he was advised per Dr Artist Pais instructions, he has decided to schedule follow up appointment for detailed explanation regarding his thyroid. Appointment scheduled for 4/26 @ 10:45 am Follow-up by: Glendell Docker CMA,  August 04, 2009 9:34 AM    New/Updated Medications: LEVOTHYROXINE SODIUM 25 MCG TABS (LEVOTHYROXINE SODIUM) one by mouth once daily Prescriptions: LEVOTHYROXINE SODIUM 25 MCG TABS (LEVOTHYROXINE SODIUM) one by mouth once daily  #30 x 1   Entered and Authorized by:   D. Thomos Lemons DO   Signed by:   D. Thomos Lemons DO on 08/03/2009   Method used:   [facility] [address] [phone]* (retail)       [address]       [place], [place]  [address]       Ph: [phone] or [phone]       Fax: [phone]   RxID:   [id number]

## 2010-05-12 NOTE — Assessment & Plan Note (Signed)
Summary: RETURNING TO [place]  CK UP WITH DR Rubye Beach   Vital Signs:  Patient profile:   77 year old male Height:      70 inches Weight:      139.25 pounds BMI:     20.05 O2 Sat:      99 % on Room air Temp:     97.4 degrees F oral Pulse rate:   54 / minute Pulse rhythm:   regular Resp:     22 per minute BP sitting:   150 / 70  (right arm) Cuff size:   regular  Vitals Entered By: Glendell Docker CMA (July 11, 2009 11:14 AM)  O2 Flow:  Room air CC: Rm 2- Follow up disease management   Primary Care Provider:  DThomos Lemons DO  CC:  Rm 2- Follow up disease management.  History of Present Illness: 77 y/o white male with hx of htn, carotid disease, and prostate cancer for follow up he recently returned to [place] after spending winter in [place] his physician in [place] is a cardiologist. We reviewd holter monitor.   Pt was having PVCs.   carvedilol dose increased to 12.5 mg at bedtime  He denies chest pain he reports occ dizziness.    still kayaks. he rushed at lay over in [place] he got dyspneic and experienced tachycardia and dizziness.    Allergies: 1)  ! * Lisinopril  Past History:  Past Medical History: Prostate cancer, hx of - 2001 Hypertension  Colon Polyps   Chronic urinary incontinence after prostate surgury  Cataracts  Diverticulosis Phimosis Carotid artery stenosis - 11/2008 (bilateral 40-59% stenosis)  Past Surgical History: Inguinal herniorrhaphy Varicose Vein sx.  s/p laparoscopic prostate surgury  s/p skin cancer right foot - non-melanoma      Social History: Patient is single, has never been married.  Does not have any children.  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  No history of excessive alcohol use.  Occasionally smokes a cigar. Spends winters in [place]      Review of Systems       he complains of dry cracked hands (finger tips) cold  intolerance  Physical Exam  General:  thin, pleasant 77 y/o white male Head:  normocephalic and atraumatic.   Neck:  supple and no masses.  no bruit Lungs:  normal respiratory effort and normal breath sounds.   Heart:  normal rate, regular rhythm, and no gallop.   Extremities:  No lower extremity edema  Neurologic:  cranial nerves II-XII intact and gait normal.   Psych:  normally interactive and good eye contact.     Impression & Recommendations:  Problem # 1:  ABNORMAL HEART RHYTHMS (ICD-427.9) While pt living in [place] over winter, he was seen by cardiologist.  Holter monitor was performed.    he notes occ dizziness.  dyspnea while rushing at [place] airport and rapid heart rate. Holter report from [place] shows PVCs.   I suspect B Blocker being used for PVCs  EKG shows sinus bradycardia at 51 bpm.  Incomplete RBBB and left anterior fascicular block possible anterior infarct, age undetermined.  Exercise tolerance is still good.   He still kayaks.    change carvedilol to bisoprol 5 mg 1/2 by mouth once daily. refer to cardiology for further eval.   His updated medication list for this problem includes:    Aspirin Ec 81 Mg Tbec (Aspirin) ..... One  by mouth once daily    Bisoprolol Fumarate 5 Mg Tabs (Bisoprolol fumarate) .[name]... 1/2 by mouth once daily  Orders: Echo Referral (Echo) Cardiology Referral (Cardiology)  Problem # 2:  ECZEMA, HANDS (ICD-692.9) dry cracked skin at finger tips.  use ointment as directed.  His updated medication list for this problem includes:    Triamcinolone Acetonide 0.1 % Oint (Triamcinolone acetonide) .[name]... Apply two times a day  Problem # 3:  HYPERLIPIDEMIA (ICD-272.4)  He stopped simvastatin due to muscle cramps.  trial of pravastatin.  Statin started for carotid disease.   The following medications were removed from the medication list:    Simvastatin 20 Mg Tabs (Simvastatin) ..... One by mouth qd His updated medication list for  this problem includes:    Pravastatin Sodium 20 Mg Tabs (Pravastatin sodium) ..... One by mouth once daily  Labs Reviewed: SGOT: 23 (09/23/2008)   SGPT: 15 (09/23/2008)  Lipid Goals: Chol Goal: 200 (11/28/2008)   HDL Goal: 40 (11/28/2008)   LDL Goal: 100 (11/28/2008)   TG Goal: 150 (11/28/2008)  Prior 10 Yr Risk Heart Disease: 18 % (11/28/2008)   HDL:48.30 (09/23/2008), 44.1 (09/22/2007)  LDL:109 (09/23/2008), 129 (09/22/2007)  Chol:170 (09/23/2008), 189 (09/22/2007)  Trig:65.0 (09/23/2008), 81 (09/22/2007)  Complete Medication List: 1)  Ditropan Xl 15 Mg Xr24h-tab (Oxybutynin chloride) .... Take 1 tablet by mouth once a day 2)  Aspirin Ec 81 Mg Tbec (Aspirin) .... One by mouth once daily 3)  Bisoprolol Fumarate 5 Mg Tabs (Bisoprolol fumarate) .... 1/2 by mouth once daily 4)  Pravastatin Sodium 20 Mg Tabs (Pravastatin sodium) .... One by mouth once daily 5)  Triamcinolone Acetonide 0.1 % Oint (Triamcinolone acetonide) .... Apply two times a day  Patient Instructions: 1)  Please arrange follow up appointment within 1 month.  2)  Our office with contact you re:  cardiology referrral. Prescriptions: BISOPROLOL FUMARATE 5 MG TABS (BISOPROLOL FUMARATE) 1/2 by mouth once daily  #30 x 1   Entered and Authorized by:   D. Thomos Lemons DO   Signed by:   D. Thomos Lemons DO on 07/11/2009   Method used:   [facility]        [facility] [address] [phone]* (retail)       [address]       [place], [place]  [address]       Ph: [phone] or [phone]       Fax: [phone]   RxID:   [id number] TRIAMCINOLONE ACETONIDE 0.1 % OINT (TRIAMCINOLONE ACETONIDE) apply two times a day  #30 grams x 1   Entered and Authorized by:   D. Thomos Lemons DO   Signed by:   D. Thomos Lemons DO on 07/11/2009   Method used:   [facility]        [facility] [address] [phone]* (retail)       [address]       [place], [place]  [address]       Ph:  [phone] or [phone]       Fax: [phone]   RxID:   [id number] PRAVASTATIN SODIUM 20 MG TABS (PRAVASTATIN SODIUM) one by mouth once daily  #90 x 0   Entered and Authorized by:   D. Thomos Lemons DO   Signed by:   D. Thomos Lemons DO on 07/11/2009   Method used:   [facility]ronically to  [facility] [address] [phone]* (retail)       [address]       [place], [place]  [address]       Ph: [phone] or [phone]       Fax: [phone]   RxID:   [id number]   Current Allergies (reviewed today): ! * LISINOPRIL   Patient Instructions: 1)  Please arrange follow up appointment within 1 month.  2)  Our office with contact you re:  cardiology referrral.     Appended Document: Orders Update    Clinical Lists Changes  Orders: Added new Service order of EKG w/ Interpretation (93000) - Signed

## 2010-05-12 NOTE — Letter (Signed)
Summary: [facility]   Imported By: Lanelle Bal 10/28/2009 09:35:54  _____________________________________________________________________  External Attachment:    Type:   Image     Comment:   External Document

## 2010-05-12 NOTE — Letter (Signed)
[facility]: [facility]   Imported By: Lanelle Bal 09/09/2009 09:12:42  _____________________________________________________________________  External Attachment:    Type:   Image     Comment:   External Document

## 2010-06-15 ENCOUNTER — Ambulatory Visit: Payer: Self-pay | Admitting: Internal Medicine

## 2010-06-15 ENCOUNTER — Ambulatory Visit (INDEPENDENT_AMBULATORY_CARE_PROVIDER_SITE_OTHER): Payer: Medicare Other | Admitting: Internal Medicine

## 2010-06-15 ENCOUNTER — Encounter: Payer: Self-pay | Admitting: Internal Medicine

## 2010-06-15 DIAGNOSIS — R32 Unspecified urinary incontinence: Secondary | ICD-10-CM

## 2010-06-15 DIAGNOSIS — I10 Essential (primary) hypertension: Secondary | ICD-10-CM

## 2010-06-28 LAB — CBC
MCV: 96.4 fL (ref 78.0–100.0)
Platelets: 191 10*3/uL (ref 150–400)
RDW: 13.4 % (ref 11.5–15.5)
WBC: 8.1 10*3/uL (ref 4.0–10.5)

## 2010-06-28 LAB — COMPREHENSIVE METABOLIC PANEL
ALT: 16 U/L (ref 0–53)
AST: 23 U/L (ref 0–37)
Alkaline Phosphatase: 48 U/L (ref 39–117)
CO2: 31 mEq/L (ref 19–32)
Calcium: 9.3 mg/dL (ref 8.4–10.5)
Chloride: 107 mEq/L (ref 96–112)
GFR calc non Af Amer: >60 mL/min (ref >60)
Glucose, Bld: 103 mg/dL — ABNORMAL HIGH (ref 70–99)
Potassium: 4.6 mEq/L (ref 3.5–5.1)
Sodium: 143 mEq/L (ref 135–145)
Total Bilirubin: 1.1 mg/dL (ref 0.3–1.2)

## 2010-06-28 LAB — TYPE AND SCREEN: ABO/RH(D): A POS

## 2010-06-28 LAB — ABO/RH: ABO/RH(D): A POS

## 2010-06-28 LAB — PROTIME-INR: Prothrombin Time: 15 seconds (ref 11.6–15.2)

## 2010-06-30 NOTE — Letter (Signed)
Summary: [facility]  [facility]   Imported By: Maryln Gottron 06/25/2010 12:59:36  _____________________________________________________________________  External Attachment:    Type:   Image     Comment:   External Document

## 2010-07-09 NOTE — Assessment & Plan Note (Signed)
Summary: bladder problems/ss   Vital Signs:  Patient profile:   77 year old male Height:      70 inches Weight:      143.75 pounds BMI:     20.70 O2 Sat:      98 % on Room air Temp:     98.1 degrees F oral Pulse rate:   53 / minute Resp:     16 per minute BP sitting:   124 / 70  (right arm) Cuff size:   regular  Vitals Entered By: Glendell Docker CMA (June 15, 2010 3:36 PM)  O2 Flow:  Room air CC: Discuss Bladder Surgery Is Patient Diabetic? No Pain Assessment Patient in pain? no        Primary Care Provider:  Dondra Spry DO  CC:  Discuss Bladder Surgery.  History of Present Illness: 77 y/o male for follow up came back earlier than usual due to business in [place]  prev deferred surgery for urinary incontinence oxybutinin stopped working completely  tried duloxetina  (TCA) - limited by sedation incontinence worse with standing and walking incontinence better when he is [place] (less walking)  surgery planned for 5/8   Preventive Screening-Counseling & Management  Alcohol-Tobacco     Smoking Status: never  Allergies: 1)  ! * Lisinopril  Past History:  Past Medical History: Prostate cancer, hx of - 2001 Hypertension   Colon Polyps      Chronic urinary incontinence after prostate surgury  Cataracts   Diverticulosis Phimosis Carotid artery stenosis - 11/2008 (bilateral 40-59% stenosis)  Past Surgical History: Inguinal herniorrhaphy  Varicose Vein sx.     s/p laparoscopic prostate surgury  s/p skin cancer right foot - non-melanoma       Social History: Patient is single, has never been married.  Does not have any children.  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  No history of excessive alcohol use.  Occasionally smokes a cigar. Spends winters in [place]       Physical Exam  General:  alert, well-developed, and well-nourished.   Lungs:  normal respiratory effort  and normal breath sounds.   Heart:  normal rate, regular rhythm, and no gallop.   Extremities:  No lower extremity edema  Neurologic:  cranial nerves II-XII intact and gait normal.     Impression & Recommendations:  Problem # 1:  URINARY INCONTINENCE (ICD-788.30) Assessment Deteriorated pt has decided to proceed with surgery symptoms exacerbated by any extended walks some improved with use  of TCA but symptoms limited by drowsiness trial of OTC pseudoephedrine  Problem # 2:  HYPERTENSION (ICD-401.9) Assessment: Unchanged  His updated medication list for this problem includes:    Bisoprolol Fumarate 5 Mg Tabs (Bisoprolol fumarate) .[name]... 1/2 by mouth once daily  BP today: 124/70 Prior BP: 116/54 (01/27/2010)  Prior 10 Yr Risk Heart Disease: 18 % (11/28/2008)  Labs Reviewed: K+: 5.0 (07/31/2009) Creat: : 1.4 (07/31/2009)   Chol: 118 (07/31/2009)   HDL: 49.80 (07/31/2009)   LDL: 41 (07/31/2009)   TG: 135.0 (07/31/2009)  Complete Medication List: 1)  Aspirin Ec 81 Mg Tbec (Aspirin) .... One by mouth once daily 2)  Bisoprolol Fumarate 5 Mg Tabs (Bisoprolol fumarate) .... 1/2 by mouth once daily 3)  Pravastatin Sodium 20 Mg Tabs (Pravastatin sodium) .... One by mouth once daily 4)  Fish Oil 500 Mg Caps (Omega-3 fatty acids) .... Take 1 capsule by mouth two times  a day 5)  Senior Multivitamin Plus Tabs (Multiple vitamins-minerals) .... Take 1 tablet by mouth once a day 6)  Cymbalta 30 Mg Cpep (Duloxetine hcl) .... Take 1 capsule by mouth once a day  Patient Instructions: 1)  Try taking pseudoepheprine 30 mg by mouth two times a day as needed 2)  Please schedule a follow up visit after your surgery   Orders Added: 1)  Est. Patient Level III [10272]

## 2010-08-05 ENCOUNTER — Encounter: Payer: Self-pay | Admitting: Internal Medicine

## 2010-08-06 ENCOUNTER — Ambulatory Visit (HOSPITAL_BASED_OUTPATIENT_CLINIC_OR_DEPARTMENT_OTHER)
Admission: RE | Admit: 2010-08-06 | Discharge: 2010-08-06 | Disposition: A | Discharge status: Home / Self Care | Payer: Medicare Other | Source: Ambulatory Visit | Attending: Internal Medicine | Admitting: Internal Medicine

## 2010-08-06 ENCOUNTER — Other Ambulatory Visit: Payer: Self-pay | Admitting: Internal Medicine

## 2010-08-06 ENCOUNTER — Ambulatory Visit (INDEPENDENT_AMBULATORY_CARE_PROVIDER_SITE_OTHER): Payer: Medicare Other | Admitting: Internal Medicine

## 2010-08-06 ENCOUNTER — Encounter: Payer: Self-pay | Admitting: Internal Medicine

## 2010-08-06 VITALS — BP 124/70 | HR 54 | Temp 97.8°F | Resp 16 | Ht 70.0 in | Wt 150.0 lb

## 2010-08-06 DIAGNOSIS — M7989 Other specified soft tissue disorders: Secondary | ICD-10-CM

## 2010-08-06 DIAGNOSIS — R32 Unspecified urinary incontinence: Secondary | ICD-10-CM

## 2010-08-06 DIAGNOSIS — T148XXA Other injury of unspecified body region, initial encounter: Secondary | ICD-10-CM

## 2010-08-06 DIAGNOSIS — R609 Edema, unspecified: Secondary | ICD-10-CM | POA: Insufficient documentation

## 2010-08-06 MED ORDER — DULOXETINE HCL 30 MG PO CPEP: 30.0000 mg | ORAL_CAPSULE | Freq: Every day | ORAL | Status: DC

## 2010-08-06 NOTE — Patient Instructions (Addendum)
Please follow up 2 weeks after your surgery You can also use pseudoepheprine 30 mg by mouth two times a day as needed
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
## 2010-08-10 ENCOUNTER — Encounter (HOSPITAL_COMMUNITY): Payer: Medicare Other

## 2010-08-10 ENCOUNTER — Other Ambulatory Visit: Payer: Self-pay | Admitting: Urology

## 2010-08-10 ENCOUNTER — Ambulatory Visit (HOSPITAL_COMMUNITY)
Admission: RE | Admit: 2010-08-10 | Discharge: 2010-08-10 | Disposition: A | Discharge status: Home / Self Care | Payer: Medicare Other | Source: Ambulatory Visit | Attending: Urology | Admitting: Urology

## 2010-08-10 ENCOUNTER — Other Ambulatory Visit (HOSPITAL_COMMUNITY): Payer: Self-pay | Admitting: Urology

## 2010-08-10 DIAGNOSIS — Z01810 Encounter for preprocedural cardiovascular examination: Secondary | ICD-10-CM | POA: Insufficient documentation

## 2010-08-10 DIAGNOSIS — Z01818 Encounter for other preprocedural examination: Secondary | ICD-10-CM

## 2010-08-10 DIAGNOSIS — Z01812 Encounter for preprocedural laboratory examination: Secondary | ICD-10-CM | POA: Insufficient documentation

## 2010-08-10 DIAGNOSIS — J984 Other disorders of lung: Secondary | ICD-10-CM | POA: Insufficient documentation

## 2010-08-10 LAB — HEPATIC FUNCTION PANEL
ALT: 18 U/L (ref 0–53)
AST: 25 U/L (ref 0–37)
Alkaline Phosphatase: 46 U/L (ref 39–117)
Bilirubin, Direct: 0.1 mg/dL (ref 0.0–0.3)
Indirect Bilirubin: 0.7 mg/dL (ref 0.3–0.9)

## 2010-08-10 LAB — CBC
MCH: 30.8 pg (ref 26.0–34.0)
MCHC: 32.2 g/dL (ref 30.0–36.0)
MCV: 95.5 fL (ref 78.0–100.0)
Platelets: 244 10*3/uL (ref 150–400)
RDW: 13 % (ref 11.5–15.5)

## 2010-08-10 LAB — BASIC METABOLIC PANEL
CO2: 32 mEq/L (ref 19–32)
Calcium: 9.5 mg/dL (ref 8.4–10.5)
Chloride: 101 mEq/L (ref 96–112)
Creatinine, Ser: 1.05 mg/dL (ref 0.4–1.5)
Glucose, Bld: 97 mg/dL (ref 70–99)

## 2010-08-10 LAB — SURGICAL PCR SCREEN: MRSA, PCR: NEGATIVE

## 2010-08-10 LAB — PROTIME-INR: Prothrombin Time: 14.4 seconds (ref 11.6–15.2)

## 2010-08-18 ENCOUNTER — Inpatient Hospital Stay (HOSPITAL_COMMUNITY)
Admission: RE | Admit: 2010-08-18 | Discharge: 2010-08-20 | DRG: 664 | Disposition: A | Discharge status: Home / Self Care | Payer: Medicare Other | Source: Ambulatory Visit | Attending: Urology | Admitting: Urology

## 2010-08-18 ENCOUNTER — Other Ambulatory Visit: Payer: Self-pay | Admitting: Urology

## 2010-08-18 DIAGNOSIS — Z01812 Encounter for preprocedural laboratory examination: Secondary | ICD-10-CM

## 2010-08-18 DIAGNOSIS — Y831 Surgical operation with implant of artificial internal device as the cause of abnormal reaction of the patient, or of later complication, without mention of misadventure at the time of the procedure: Secondary | ICD-10-CM | POA: Diagnosis present

## 2010-08-18 DIAGNOSIS — IMO0002 Reserved for concepts with insufficient information to code with codable children: Principal | ICD-10-CM | POA: Diagnosis present

## 2010-08-18 DIAGNOSIS — N320 Bladder-neck obstruction: Secondary | ICD-10-CM | POA: Diagnosis present

## 2010-08-18 DIAGNOSIS — N393 Stress incontinence (female) (male): Secondary | ICD-10-CM | POA: Diagnosis present

## 2010-08-18 DIAGNOSIS — Y9210 Unspecified residential institution as the place of occurrence of the external cause: Secondary | ICD-10-CM | POA: Diagnosis present

## 2010-08-18 DIAGNOSIS — R339 Retention of urine, unspecified: Secondary | ICD-10-CM | POA: Diagnosis present

## 2010-08-18 DIAGNOSIS — I10 Essential (primary) hypertension: Secondary | ICD-10-CM | POA: Diagnosis present

## 2010-08-18 DIAGNOSIS — N9989 Other postprocedural complications and disorders of genitourinary system: Principal | ICD-10-CM | POA: Diagnosis present

## 2010-08-18 DIAGNOSIS — R5381 Other malaise: Secondary | ICD-10-CM | POA: Diagnosis present

## 2010-08-18 HISTORY — PX: BLADDER SUSPENSION: SHX72

## 2010-08-18 LAB — TYPE AND SCREEN

## 2010-08-19 LAB — BASIC METABOLIC PANEL
BUN: 13 mg/dL (ref 6–23)
CO2: 30 mEq/L (ref 19–32)
GFR calc non Af Amer: >60 mL/min (ref >60)
Glucose, Bld: 114 mg/dL — ABNORMAL HIGH (ref 70–99)
Potassium: 4.3 mEq/L (ref 3.5–5.1)
Sodium: 137 mEq/L (ref 135–145)

## 2010-08-19 LAB — CBC
HCT: 40.2 % (ref 39.0–52.0)
Hemoglobin: 13 g/dL (ref 13.0–17.0)
MCV: 95.7 fL (ref 78.0–100.0)
RDW: 13.1 % (ref 11.5–15.5)
WBC: 9.2 10*3/uL (ref 4.0–10.5)

## 2010-08-24 NOTE — H&P (Signed)
  NAME:  MAUREEN, DUESING NO.:  [id number]  MEDICAL RECORD NO.:  [id number]           PATIENT TYPE:  I  LOCATION:  1435                         FACILITY:  [facility]  PHYSICIAN:  Martina Sinner, MD DATE OF BIRTH:  1927/01/29  DATE OF ADMISSION:  08/18/2010 DATE OF DISCHARGE:                             HISTORY & PHYSICAL   ADMISSION DIAGNOSIS:  Fatigue and retention post male sling surgery plus cystoscopy.  CHIEF COMPLAINT:  Retention and fatigue.  HISTORY OF PRESENT ILLNESS:  Mr. Kost had a male sling that went very well yesterday.  He had minimal pain post procedure.  He was quite mobile though felt very fatigued and was quite nervous going home.  He is elderly.  He had stress incontinence prior to the procedure.  There were no other modifying factor or associated signs and symptoms.  There were no other aggravating or alleviating factors.  The symptom was persistent and present since a radical prostatectomy.  PAST HEALTH:  Cancer of the prostate.  MEDICATIONS:  See orders.  ALLERGIES:  None.  SOCIAL HISTORY:  Does not smoke.  FAMILY HISTORY:  No GU disease.  REVIEW OF SYSTEMS:  Rest of review of systems negative.  PHYSICAL EXAMINATION:  GENERAL:  No distress. CARDIOVASCULAR:  Skin warm. RESPIRATORY:  Breaths quiet. GU EXAM:  Mild ecchymosis to scrotum postprocedure.  Incisions look good. ABDOMEN:  Flat, nontender. MUSCULOSKELETAL:  Limited mobility due to weakness. NEUROMUSCULAR:  Normal sensation. SKIN:  No rashes. LYMPHATICS:  No inguinal adenopathy.  Mr. Wittler blood work and vitals were normal 1 day post procedure. Laboratory tests were within normal limits.  He will be observed for 1 more day.  He failed trial of voiding.  He voided 50 mL with residual 450 mL.  A 14- French Foley catheter was inserted.  I will do another trial of voiding.          ______________________________ Martina Sinner, MD     SAM/MEDQ  D:   08/19/2010  T:  08/19/2010  Job:  [id number]  Electronically Signed by Alfredo Martinez MD on 08/24/2010 01:16:28 PM

## 2010-08-24 NOTE — Op Note (Signed)
NAME:  MARCH, STEYER NO.:  [id number]  MEDICAL RECORD NO.:  [id number]          PATIENT TYPE:  LOCATION:                                 FACILITY:  PHYSICIAN:  Martina Sinner, MD DATE OF BIRTH:  Jul 11, 1926  DATE OF PROCEDURE: DATE OF DISCHARGE:                              OPERATIVE REPORT   ASSISTANT:  Delia Chimes, NP  PREOPERATIVE DIAGNOSES:  Mild bladder neck contracture and stress urinary incontinence.  POSTOPERATIVE DIAGNOSIS:  Mild bladder neck contracture and stress urinary incontinence.  SURGERY:  Insertion of male sling (advance) plus cystoscopy.  INDICATIONS:  Mr.  Stankus has an asymptomatic mild bladder neck contracture.  He has mild stress incontinence.  Consented to the above procedure.  Preoperative antibiotics were given.  Extra care was taken with leg positioning to minimize risk of compartment syndrome, neuropathy, and DVT.  All landmarks were marked.  DESCRIPTION OF PROCEDURE:  I made a 5 cm perineal incision, dissected down through his subcutaneous tissue and mobilized the bulbospongiosus muscles.  I split the thin bulbospongiosus muscle and mobilized it from the bulbar urethra at the area of the bifurcation at the corporal bodies.  I could feel the usual triangle with landmarks.  I could identify the perineal tendon.  This was a little bit atrophic.  To the patient's right of his bulbar urethra, in the crux of the triangle there was some scar tissue.  I did not think it was malignancy but I took a biopsy and sent it for frozen section.  On frozen section, there was a foreign body inflammatory action.  It did not influence my landmarks.  I placed a 3-0 Vicryl suture at the level of the perineal tendon.  I removed the 14-French catheter and cystoscoped the patient and did my usual maneuver, did see nice ascensus of the bladder neck and coaptation of it.  With the bladder emptied, I marked the upper aspect of the  obturator foramen with a foramen needle and palpably.  It was away from the abductor tendon.  I did this maneuver multiple times.  I used a foramen needle initially as per protocol.  I then did a double pop maneuver using the Advance trocar and rotated easily on the pulp of my index finger bringing it through the upper aspect of the triangle.  I attached the sling and rotated it back using a small clamp to prevent overpulling of the sling.  The same procedure was done on the patient's right side. The sling was then cinched lightly down to the area of the 3-0 Vicryl suture.  The 3-0 Vicryl was brought through the middle aspect of the sling and tied loosely.  Using my finger on the sling apex, I gently pulled out on each sling bilaterally rotating the bulbar urethra cephalad.  I was very pleased with the maneuver.  There was no bleeding.  I re-cystoscoped the patient.  There was nice coaptation of the bladder neck with relative straightening of the bulbar urethra.  There was no bladder injury.  The patient at the beginning of the case did have a mild bladder neck contracture.  A 17-French scope could just be passed through it.  I did the same maneuver with a 21-French scope, and it fit in nicely though a little bit snugly.  There was no bleeding.  I did not do balloon dilation.  The 14-French catheter was reinserted.  All incisions were irrigated.  I did a 3-layer closure with 3-0 Vicryl anatomically.  I closed the perineal skin with 4-0 Vicryl.  I delivered the sling through 2 other inguinal incisions 2 to 3 cm below the initial with the usual technique.  I cut below the blue dots, irrigated the sheath, and removed the sheaths.  I cut the sling at the skin level.  Four inguinal incisions were closed with interrupted 4-0 Vicryl.  Dermabond was used for all incisions.  After Dermabond dried, Telfa fluff dressing and mesh pants was applied.  I was very pleased with the operation.   Blood loss was less than 50 mL. Hopefully, the operation will reach Mr. Mcgue treatment goal.          ______________________________ Martina Sinner, MD     SAM/MEDQ  D:  08/18/2010  T:  08/18/2010  Job:  [id number]  Electronically Signed by Alfredo Martinez MD on 08/24/2010 01:16:26 PM

## 2010-08-30 DIAGNOSIS — T148XXA Other injury of unspecified body region, initial encounter: Secondary | ICD-10-CM | POA: Insufficient documentation

## 2010-08-30 NOTE — Assessment & Plan Note (Signed)
Pt ran out of meds for bladder. Samples of duloxetine provided.

## 2010-08-30 NOTE — Assessment & Plan Note (Signed)
Pt with resolving right lower ext hematoma.   Area still swollen and tender Rule out DVT - repeat LE doppler

## 2010-08-31 ENCOUNTER — Ambulatory Visit: Payer: Medicare Other | Admitting: Internal Medicine

## 2010-09-04 ENCOUNTER — Ambulatory Visit: Payer: Medicare Other | Admitting: Internal Medicine

## 2010-09-10 ENCOUNTER — Ambulatory Visit (INDEPENDENT_AMBULATORY_CARE_PROVIDER_SITE_OTHER): Payer: Medicare Other | Admitting: Family Medicine

## 2010-09-10 ENCOUNTER — Telehealth: Payer: Self-pay | Admitting: *Deleted

## 2010-09-10 ENCOUNTER — Encounter: Payer: Self-pay | Admitting: Family Medicine

## 2010-09-10 VITALS — BP 128/72 | HR 72 | Temp 98.2°F | Resp 16 | Wt 147.0 lb

## 2010-09-10 DIAGNOSIS — I868 Varicose veins of other specified sites: Secondary | ICD-10-CM

## 2010-09-10 DIAGNOSIS — R011 Cardiac murmur, unspecified: Secondary | ICD-10-CM

## 2010-09-10 DIAGNOSIS — J069 Acute upper respiratory infection, unspecified: Secondary | ICD-10-CM

## 2010-09-10 DIAGNOSIS — I8390 Asymptomatic varicose veins of unspecified lower extremity: Secondary | ICD-10-CM

## 2010-09-10 HISTORY — DX: Cardiac murmur, unspecified: R01.1

## 2010-09-10 MED ORDER — PRAVASTATIN SODIUM 20 MG PO TABS: 20.0000 mg | ORAL_TABLET | Freq: Every day | ORAL | Status: DC

## 2010-09-10 MED ORDER — BISOPROLOL FUMARATE 5 MG PO TABS: ORAL_TABLET | ORAL | Status: DC

## 2010-09-10 NOTE — Telephone Encounter (Signed)
Needs refills on [facility]rolol [facility] Refills sent for 30 day supply and 1 refill on each.

## 2010-09-10 NOTE — Patient Instructions (Signed)
Use mucinex DM or robitussin DM for your cold symptoms. Avoid any OTC meds that have decongestants (phenylephrine or sudafed).

## 2010-09-11 NOTE — Assessment & Plan Note (Signed)
Diastolic murmur. I can't find mention of this in past notes, but an echo done 07/2009 for abnormal EKG showed some mild aortic insufficiency and this correlates with his murmur.   He is very physically active and without any physical limitations. No further w/u at this time.  Discussed warning signs of worsening AI.

## 2010-09-11 NOTE — Assessment & Plan Note (Signed)
Discussed self limited nature of this.  Discussed symptomatic care with mucinex Dm or robitussin dm. Call or return if sx's not improving in 10d.

## 2010-09-11 NOTE — Progress Notes (Signed)
OFFICE VISIT  09/11/2010   CC:  Chief Complaint  Patient presents with  . URI    Pt has had cough, congestion and sore throat x 2 days, fever of 100 last night.  . Referral    Pt is requesting a referral to a vein specialist     HPI:    Patient is a 77 y.o. Caucasian male who presents for URI sx's and questions about his varicose veins. Reports 2d of mild ST/scratchy throat, some "hacking" cough, and mild runny nose.  No HA, no fever, no wheezing or SOB or CP.  Reports having bladder surgery 3 wks ago (incontinence sling---has helped tremendously!) and in his pre-op eval they got a right leg venous doppler u/s to r/o DVT.  He basically has multiple lower leg varicosities, right worse than left leg.  These have been present for years and are unchanged lately.  He has no edema in legs and has no pain associated with these veins.  No history of DVT or SVT.  In the distant past he had some injected and asks if a referral to a vein specialist at this time would be ok.  Past Medical History  Diagnosis Date  . History of prostate cancer 2001  . Hypertension   . Colon polyps   . Urinary incontinence     chronic urinary incontinence after prostate surgery  . Cataract   . Diverticulosis   . Phimosis   . Carotid artery stenosis 11/2008    bilateral 40-59% stenosis  No hx of pneumonia or COPD.  Past Surgical History  Procedure Date  . Inguinal hernia repair   . Varicose vein surgery   . Other surgical history     s/p laparoscopic prostate surgery  . Skin cancer excision     a/p skin cancer right foot-non melanoma    Outpatient Prescriptions Prior to Visit  Medication Sig Dispense Refill  . Ascorbic Acid (VITAMIN C) 500 MG tablet Take 500 mg by mouth daily.        [name] aspirin 81 MG tablet Take 81 mg by mouth daily.        . bisoprolol (ZEBETA) 5 MG tablet 1/2 tablet by mouth once a day  15 tablet  1  . Multiple Vitamin (MULTIVITAMIN) tablet Take 1 tablet by mouth daily.        .  Omega-3 Fatty Acids (FISH OIL) 500 MG CAPS Take 500 mg by mouth 2 (two) times daily.        . pravastatin (PRAVACHOL) 20 MG tablet Take 1 tablet (20 mg total) by mouth daily.  30 tablet  1  . bisoprolol (ZEBETA) 5 MG tablet Take 5 mg by mouth. 1/2 tablet by mouth once a day       . DULoxetine (CYMBALTA) 30 MG capsule Take 30 mg by mouth daily.        . DULoxetine (CYMBALTA) 30 MG capsule Take 1 capsule (30 mg total) by mouth daily.  30 capsule  0  . pravastatin (PRAVACHOL) 20 MG tablet Take 20 mg by mouth daily.          Allergies  Allergen Reactions  . Lisinopril     REACTION: dizziness    ROS As per HPI  PE: Blood pressure 128/72, pulse 72, temperature 98.2 F (36.8 C), temperature source Oral, resp. rate 16, weight 147 lb (66.679 kg). Gen: Alert, well appearing.  Patient is oriented to person, place, time, and situation. VS: noted--normal. Gen: alert, NAD, NONTOXIC APPEARING.  HEENT: eyes without injection, drainage, or swelling.  Ears: EACs clear, TMs with normal light reflex and landmarks.  Nose: Clear rhinorrhea, with some dried, crusty exudate adherent to mildly injected mucosa.  No purulent d/c.  No paranasal sinus TTP.  No facial swelling.  Throat and mouth without focal lesion.  No pharyngial swelling, erythema, or exudate.   Neck: supple, no LAD.   LUNGS: CTA bilat, nonlabored resps.   CV: RRR, with 1-2/6 diastolic murmur heard in left precordium (best at the apex). EXT: no c/c/e.  He does have extensive large varicose veins in both lower legs.  These are nontender, soft, and without erythema. SKIN: no rash  LABS:  none  IMPRESSION AND PLAN:  Viral URI Discussed self limited nature of this.  Discussed symptomatic care with mucinex Dm or robitussin dm. Call or return if sx's not improving in 10d.  Heart murmur Diastolic murmur. I can't find mention of this in past notes, but an echo done 07/2009 for abnormal EKG showed some mild aortic insufficiency and this  correlates with his murmur.   He is very physically active and without any physical limitations. No further w/u at this time.  Discussed warning signs of worsening AI.  VARICOSE VEIN Referred to [facility] for further evaluation/management of this problem.     FOLLOW UP: Return if symptoms worsen or fail to improve.

## 2010-09-11 NOTE — Assessment & Plan Note (Signed)
Referred to [facility] for further evaluation/management of this problem.

## 2010-09-15 ENCOUNTER — Ambulatory Visit: Payer: Medicare Other | Admitting: Family Medicine

## 2010-09-17 ENCOUNTER — Ambulatory Visit (INDEPENDENT_AMBULATORY_CARE_PROVIDER_SITE_OTHER): Payer: Medicare Other | Admitting: Family Medicine

## 2010-09-17 ENCOUNTER — Encounter: Payer: Self-pay | Admitting: Family Medicine

## 2010-09-17 VITALS — BP 110/58 | HR 56 | Temp 98.0°F | Resp 18 | Wt 149.0 lb

## 2010-09-17 DIAGNOSIS — N4889 Other specified disorders of penis: Secondary | ICD-10-CM

## 2010-09-17 DIAGNOSIS — N485 Ulcer of penis: Secondary | ICD-10-CM

## 2010-09-17 MED ORDER — VALACYCLOVIR HCL 500 MG PO TABS: 500.0000 mg | ORAL_TABLET | Freq: Two times a day (BID) | ORAL | Status: DC

## 2010-09-17 NOTE — Progress Notes (Signed)
OFFICE NOTE  09/19/2010  CC:  Chief Complaint  Patient presents with  . Diarrhea    not diarrhea loose bowels- mucousy in consistency for the past month  . Pain    pain iin his left lung-discomfort with deep breaths  . penial irritation-    no bleeding drainage at irritation site     HPI:   Patient is a 77 y.o. Caucasian male who is here for 3 concerns: 1) 1 wk of soreness on penis tip,? Ulcer x 1-2 d, discomfort but not painful.  Distant hx of genital herpes in his 30's. 2) Continued loose bowels since incont surgery 1 mo ago--soft like porridge, has 1 BM daily, brown color.  No blood or mucous noted.  No correlation with any specific type of food.  Initially was on stool softener after surgery but stopped after a few loose stools in a row. 3) Hx of ""pleurisy" in distant past (1960s)--left lung scarring noted on x-rays since.  Has pain in right chest with deep inspiration.  No associated SOB, no cough. Pertinent PMH:  Herpes genitalis in the distant past Prostate cancer, s/p prostatectomy. Urinary incontinence, s/p recent sling surgery. Varicose veins HTN Hyperlipidemia Diastolic heart murmur--aortic insuff on echo (mild) MEDS;   Outpatient Prescriptions Prior to Visit  Medication Sig Dispense Refill  . Ascorbic Acid (VITAMIN C) 500 MG tablet Take 500 mg by mouth daily.        [name] aspirin 81 MG tablet Take 81 mg by mouth daily.        . bisoprolol (ZEBETA) 5 MG tablet 1/2 tablet by mouth once a day  15 tablet  1  . Multiple Vitamin (MULTIVITAMIN) tablet Take 1 tablet by mouth daily.        . Omega-3 Fatty Acids (FISH OIL) 500 MG CAPS Take 500 mg by mouth 2 (two) times daily.        . pravastatin (PRAVACHOL) 20 MG tablet Take 1 tablet (20 mg total) by mouth daily.  30 tablet  1    PE: Blood pressure 110/58, pulse 56, temperature 98 F (36.7 C), temperature source Oral, resp. rate 18, weight 149 lb (67.586 kg), SpO2 98.00%. Gen: Alert, well appearing.  Patient is oriented to  person, place, time, and situation. Oropharynx pink, moist, without lesion.  Neck: no LAD, TM, or mass. Chest: symmetric expansion, nonlabored respirations.  Clear and equal breath sounds in all lung fields.  No chest wall tenderness. CV: RRR, soft diastolic murmur--no change from previous exam.   ABD: soft, NT, ND, BS normal.  No HSM or mass. GU: mid shaft of penis has 2mm superficial, gray-based ulceration, with no palpable induration or nodularity.  No drainage.  Nontender. Rectal: slight diminished tone, no mass. 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 Valtrex 500mg  bid x 3d. Sent viral swab of the lesion today. If this is not resolved in next 10d or so and if culture neg for HSV, will send to urology for consideration of biopsy.   Loose BMs: unsure of etiology but reassure pt I don't think w/u needed at this time.  Pleuritic CP: reassured patient that this is consistent with is past history of right lung pleuritic infection with resultant scarring. Encouraged him to continue exercising like he does.  FOLLOW UP:  Return if symptoms worsen or fail to improve.

## 2010-09-19 DIAGNOSIS — N485 Ulcer of penis: Secondary | ICD-10-CM | POA: Insufficient documentation

## 2010-09-19 NOTE — Assessment & Plan Note (Signed)
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 Valtrex 500mg  bid x 3d. Sent viral swab of the lesion today. If this is not resolved in next 10d or so and if culture neg for HSV, will send to urology for consideration of biopsy.

## 2010-09-22 ENCOUNTER — Ambulatory Visit: Payer: Medicare Other | Admitting: Internal Medicine

## 2010-09-24 HISTORY — PX: EYE SURGERY: SHX253

## 2010-09-30 ENCOUNTER — Ambulatory Visit: Payer: Medicare Other | Admitting: Family

## 2010-09-30 ENCOUNTER — Encounter: Payer: Self-pay | Admitting: Family

## 2010-09-30 ENCOUNTER — Ambulatory Visit (INDEPENDENT_AMBULATORY_CARE_PROVIDER_SITE_OTHER): Payer: Medicare Other | Admitting: Family

## 2010-09-30 VITALS — BP 126/68 | HR 54 | Temp 97.8°F | Resp 16 | Ht 70.0 in | Wt 149.1 lb

## 2010-09-30 DIAGNOSIS — N485 Ulcer of penis: Secondary | ICD-10-CM

## 2010-09-30 DIAGNOSIS — I10 Essential (primary) hypertension: Secondary | ICD-10-CM

## 2010-09-30 DIAGNOSIS — N4889 Other specified disorders of penis: Secondary | ICD-10-CM

## 2010-09-30 DIAGNOSIS — E785 Hyperlipidemia, unspecified: Secondary | ICD-10-CM

## 2010-09-30 LAB — HEPATIC FUNCTION PANEL
ALT: 11 U/L (ref 0–53)
Indirect Bilirubin: 0.5 mg/dL (ref 0.0–0.9)
Total Protein: 6.8 g/dL (ref 6.0–8.3)

## 2010-09-30 MED ORDER — BISOPROLOL FUMARATE 5 MG PO TABS: ORAL_TABLET | ORAL | Status: DC

## 2010-09-30 MED ORDER — PRAVASTATIN SODIUM 20 MG PO TABS: 20.0000 mg | ORAL_TABLET | Freq: Every day | ORAL | Status: DC

## 2010-09-30 NOTE — Assessment & Plan Note (Signed)
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

## 2010-09-30 NOTE — Patient Instructions (Signed)
Please follow up in 6 months, sooner if problems or concerns. Come fasting to your appointment in 6 months.

## 2010-09-30 NOTE — Assessment & Plan Note (Signed)
Stable on bisoprolol, continue same.

## 2010-09-30 NOTE — Assessment & Plan Note (Signed)
Check LFT's.  Pt instructed to come fasting to his next visit- he will need FLP at that time.

## 2010-09-30 NOTE — Progress Notes (Signed)
Subjective:    Patient ID: Jeremy Kelley, male    DOB: [birth year], 77 y.o.   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  Will be out of the country for about 2 months.  Bladder sling- reports hx of incontinence due to hx prostate cancer.  He reports that he saw him this AM for blood in his underwear.  He was given an antibiotic-cephalexin.    Penile lesion- he reports that this is completely resolved.    Patient presents today for followup of hypertension.  Patient has been treated for Chronic HTN for quiet sometime.  He is on bisoprolol.  No associated S/S related to HTN.   Quality: chronic Modifying factor: meds Duration: Quite sometime Associated S/S: None.  The patient denies the following associated symptoms: Chest pain, dyspnea, blurred vision, headache, or lower extremity edema.  Hyperlipidemia- on pravastatin,   Reports that he has a fungus on his toe and is scheduled to see Dermatology tomorrow.    Review of Systems See HPI  Past Medical History  Diagnosis Date  . History of prostate cancer 2001  . Hypertension   . Colon polyps   . Urinary incontinence     chronic urinary incontinence after prostate surgery  . Cataract   . Diverticulosis   . Phimosis   . Carotid artery stenosis 11/2008    bilateral 40-59% stenosis    History   Social History  . Marital Status: Single    Spouse Name: N/A    Number of Children: N/A  . Years of Education: N/A   Occupational History  . Not on file.   Social History Main Topics  . Smoking status: Passive Smoker    Types: Cigars  . Smokeless tobacco: Not on file   Comment: occasionally smokes a cigar  . Alcohol Use: Yes     3-4 glasses of wine per week-no history of excessinve alcohol use  . Drug Use: Not on file  . Sexually Active: Not on file   Other Topics Concern  . Not on file   Social History Narrative   Patient is single, has never been married.  Does not have any children.  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  No history of excessive alcohol use.  Occasionally smokes a cigar.Spends winters in [place]         Past Surgical History  Procedure Date  . Inguinal hernia repair   . Varicose vein surgery   . Other surgical history     s/p laparoscopic prostate surgery  . Skin cancer excision     a/p skin cancer right foot-non melanoma  . Bladder suspension 08/18/10    [name]    No family history on file.  Allergies  Allergen Reactions  . Lisinopril     REACTION: dizziness    Current Outpatient Prescriptions on File Prior to Visit  Medication Sig Dispense Refill  . Ascorbic Acid (VITAMIN C) 500 MG tablet Take 500 mg by mouth daily.        [name] aspirin 81 MG tablet Take 81 mg by mouth daily.        . bisoprolol (ZEBETA) 5 MG tablet 1/2 tablet by mouth once a day  15 tablet  1  . Multiple Vitamin (MULTIVITAMIN) tablet Take 1 tablet by mouth daily.        . Omega-3 Fatty Acids (FISH OIL) 500 MG CAPS Take 500  mg by mouth 2 (two) times daily.        . pravastatin (PRAVACHOL) 20 MG tablet Take 1 tablet (20 mg total) by mouth daily.  30 tablet  1  . valACYclovir (VALTREX) 500 MG tablet Take 1 tablet (500 mg total) by mouth 2 (two) times daily.  6 tablet  1    BP 126/68  Pulse 54  Temp(Src) 97.8 F (36.6 C) (Oral)  Resp 16  Ht 5\' 10"  (1.778 m)  Wt 149 lb 1.3 oz (67.622 kg)  BMI 21.39 kg/m2       Objective:   Physical Exam  Constitutional: He appears well-developed and well-nourished.  Cardiovascular: Normal rate and regular rhythm.   Pulmonary/Chest: Effort normal and breath sounds normal.  Musculoskeletal: He exhibits no edema.  Psychiatric: He has a normal mood and affect. His behavior is normal. Judgment and thought content normal.          Assessment & Plan:

## 2010-10-01 ENCOUNTER — Encounter: Payer: Self-pay | Admitting: Family

## 2011-02-08 ENCOUNTER — Ambulatory Visit (INDEPENDENT_AMBULATORY_CARE_PROVIDER_SITE_OTHER): Payer: Medicare Other | Admitting: Family

## 2011-02-08 ENCOUNTER — Encounter: Payer: Self-pay | Admitting: Family

## 2011-02-08 VITALS — BP 126/80 | HR 60 | Temp 97.4°F | Resp 16 | Ht 70.0 in | Wt 147.0 lb

## 2011-02-08 DIAGNOSIS — Z23 Encounter for immunization: Secondary | ICD-10-CM

## 2011-02-08 DIAGNOSIS — R32 Unspecified urinary incontinence: Secondary | ICD-10-CM

## 2011-02-08 DIAGNOSIS — E785 Hyperlipidemia, unspecified: Secondary | ICD-10-CM

## 2011-02-08 DIAGNOSIS — B353 Tinea pedis: Secondary | ICD-10-CM

## 2011-02-08 DIAGNOSIS — I10 Essential (primary) hypertension: Secondary | ICD-10-CM

## 2011-02-08 DIAGNOSIS — Z8546 Personal history of malignant neoplasm of prostate: Secondary | ICD-10-CM

## 2011-02-08 MED ORDER — BISOPROLOL FUMARATE 5 MG PO TABS: ORAL_TABLET | ORAL | Status: DC

## 2011-02-08 MED ORDER — PRAVASTATIN SODIUM 20 MG PO TABS: 20.0000 mg | ORAL_TABLET | Freq: Every day | ORAL | Status: DC

## 2011-02-08 NOTE — Assessment & Plan Note (Addendum)
This is resolved following his bladder sling procedure.

## 2011-02-08 NOTE — Assessment & Plan Note (Signed)
BP Readings from Last 3 Encounters:  02/08/11 126/80  09/30/10 126/68  09/17/10 110/58  BP is stable on beta blocker. Continue same.

## 2011-02-08 NOTE — Assessment & Plan Note (Signed)
This is followed by Urology- Dr. Gildardo Griffes Diarmid.

## 2011-02-08 NOTE — Patient Instructions (Addendum)
Athletes foot- you may use lotrimin spray over the counter for this.   Please return to the lab fasting this week to complete your lab work.  Follow up for an an appointment with [place] when you return from [place].

## 2011-02-08 NOTE — Assessment & Plan Note (Signed)
Historically his lipids have been stable on pravachol.  He is finding the crestor too expensive.  Will have pt return fasting for FLP, LFT.  Plan to return him to pravastatin.

## 2011-02-08 NOTE — Progress Notes (Signed)
Subjective:    Patient ID: Jeremy Kelley, male    DOB: 1926/06/14, 77 y.o.   MRN: [id number]  HPI  Mr.  Kelley is an 77 yr old male who presents today for follow up. He will be travelling to [place] for 5 months and is requesting enough medication for his trip.  1) HTN- on Zebeta.  Denies shortness of breath, swelling or chest pain.    2) Hyperlipidemia- was on pravachol 20mg . He was changed to crestor 20 in [place] due to availability.  He found this to be very expensive. Due for FLP.  3) Hx of prostate cancer- Pt sees Dr.  Sherron Monday.  Had bladder sling may 2012 for his incontinence and he tells me that his incontinence is resolved. He is very pleased about this.   4) immunizations- due for flu shot.    5) Athletes's foot- he notes dry skin on feet and is requesting recommendation for athletes foot.    Review of Systems    Notes that his fingertips sometimes become cold. See HPI  Past Medical History  Diagnosis Date  . History of prostate cancer 2001  . Hypertension   . Colon polyps   . Urinary incontinence     chronic urinary incontinence after prostate surgery  . Cataract   . Diverticulosis   . Phimosis   . Carotid artery stenosis 11/2008    bilateral 40-59% stenosis    History   Social History  . Marital Status: Single    Spouse Name: N/A    Number of Children: N/A  . Years of Education: N/A   Occupational History  . Not on file.   Social History Main Topics  . Smoking status: Passive Smoker    Types: Cigars  . Smokeless tobacco: Not on file   Comment: occasionally smokes a cigar  . Alcohol Use: Yes     3-4 glasses of wine per week-no history of excessinve alcohol use  . Drug Use: Not on file  . Sexually Active: Not on file   Other Topics Concern  . Not on file   Social History Narrative   Patient is single, has never been married.  Does not have any children.  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  No history of excessive alcohol use.  Occasionally smokes a cigar.Spends winters in [place]         Past Surgical History  Procedure Date  . Inguinal hernia repair   . Varicose vein surgery   . Other surgical history     s/p laparoscopic prostate surgery  . Skin cancer excision     a/p skin cancer right foot-non melanoma  . Bladder suspension 08/18/10    [name]  . Eye surgery 09/24/10    left eye. "Retina surgery"    No family history on file.  Allergies  Allergen Reactions  . Lisinopril     REACTION: dizziness    Current Outpatient Prescriptions on File Prior to Visit  Medication Sig Dispense Refill  . Ascorbic Acid (VITAMIN C) 500 MG tablet Take 500 mg by mouth daily.        [name] aspirin 81 MG tablet Take 81 mg by mouth daily.        . Multiple Vitamin (MULTIVITAMIN) tablet Take 1 tablet by mouth daily.        . Omega-3 Fatty Acids (FISH OIL) 500 MG CAPS Take 500 mg by mouth  2 (two) times daily.          BP 126/80  Pulse 60  Temp(Src) 97.4 F (36.3 C) (Oral)  Resp 16  Ht 5\' 10"  (1.778 m)  Wt 147 lb (66.679 kg)  BMI 21.09 kg/m2    Objective:   Physical Exam  Constitutional: He appears well-developed and well-nourished. No distress.  HENT:  Head: Normocephalic and atraumatic.  Mouth/Throat: No oropharyngeal exudate.  Eyes: Conjunctivae are normal. Pupils are equal, round, and reactive to light.  Cardiovascular: Normal rate and regular rhythm.   Pulmonary/Chest: Effort normal and breath sounds normal. No respiratory distress. He has no wheezes. He has no rales. He exhibits no tenderness.  Musculoskeletal: He exhibits no edema.  Skin: Skin is warm and dry.  Psychiatric: He has a normal mood and affect. His behavior is normal. Judgment and thought content normal.          Assessment & Plan:

## 2011-02-08 NOTE — Assessment & Plan Note (Signed)
Pt declined to remove his shoes today. Recommended trial of OTC antifungal spray.

## 2011-02-10 ENCOUNTER — Encounter: Payer: Self-pay | Admitting: Internal Medicine

## 2011-02-10 ENCOUNTER — Ambulatory Visit (INDEPENDENT_AMBULATORY_CARE_PROVIDER_SITE_OTHER): Payer: Medicare Other | Admitting: Internal Medicine

## 2011-02-10 DIAGNOSIS — I10 Essential (primary) hypertension: Secondary | ICD-10-CM

## 2011-02-10 DIAGNOSIS — I8393 Asymptomatic varicose veins of bilateral lower extremities: Secondary | ICD-10-CM

## 2011-02-10 DIAGNOSIS — E785 Hyperlipidemia, unspecified: Secondary | ICD-10-CM

## 2011-02-10 DIAGNOSIS — I8390 Asymptomatic varicose veins of unspecified lower extremity: Secondary | ICD-10-CM

## 2011-02-10 LAB — HEPATIC FUNCTION PANEL
Alkaline Phosphatase: 52 U/L (ref 39–117)
Indirect Bilirubin: 0.6 mg/dL (ref 0.0–0.9)
Total Bilirubin: 0.8 mg/dL (ref 0.3–1.2)
Total Protein: 6.6 g/dL (ref 6.0–8.3)

## 2011-02-10 LAB — LIPID PANEL
LDL Cholesterol: 48 mg/dL (ref 0–99)
Triglycerides: 70 mg/dL (ref <150)

## 2011-02-10 LAB — BASIC METABOLIC PANEL
BUN: 13 mg/dL (ref 6–23)
CO2: 30 mEq/L (ref 19–32)
Chloride: 105 mEq/L (ref 96–112)
Creat: 1.06 mg/dL (ref 0.50–1.35)

## 2011-02-10 MED ORDER — ROSUVASTATIN CALCIUM 5 MG PO TABS: 5.0000 mg | ORAL_TABLET | Freq: Every day | ORAL | Status: DC

## 2011-02-10 MED ORDER — ALPROSTADIL (VASODILATOR) 125 MCG UR PLLT: 125.0000 ug | PELLET | URETHRAL | Status: DC | PRN

## 2011-02-10 NOTE — Assessment & Plan Note (Signed)
Pt had rx for pravastatin filled locally but he cannot obtain pravastatin in [place].  He reports he is able to obtain crestor.  Rx for crestor to use in [place] provided.

## 2011-02-10 NOTE — Progress Notes (Signed)
Subjective:    Patient ID: Jeremy Kelley, male    DOB: 1926-10-23, 77 y.o.   MRN: [id number]  HPI  77 y/o white male with hx of htn, hyperlipidemia, and prostate ca for follow up.  Overall he has been doing very well.  He is happy with outcome of sling procedure.   His urinary incontinence has much improved.  He complains of varicose veins in his lower ext.  He also has some discoloration of skin above ankles.  He denies significant pain or fatigue of his lower ext.  Review of Systems Negative for redness or tenderness of lower ext  Past Medical History  Diagnosis Date  . History of prostate cancer 2001  . Hypertension   . Colon polyps   . Urinary incontinence     chronic urinary incontinence after prostate surgery  . Cataract   . Diverticulosis   . Phimosis   . Carotid artery stenosis 11/2008    bilateral 40-59% stenosis    History   Social History  . Marital Status: Single    Spouse Name: N/A    Number of Children: N/A  . Years of Education: N/A   Occupational History  . Not on file.   Social History Main Topics  . Smoking status: Passive Smoker    Types: Cigars  . Smokeless tobacco: Not on file   Comment: occasionally smokes a cigar  . Alcohol Use: Yes     3-4 glasses of wine per week-no history of excessinve alcohol use  . Drug Use: Not on file  . Sexually Active: Not on file   Other Topics Concern  . Not on file   Social History Narrative   Patient is single, has never been married.  Does not have any children.  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  No history of excessive alcohol use.  Occasionally smokes a cigar.Spends winters in [place]         Past Surgical History  Procedure Date  . Inguinal hernia repair   . Varicose vein surgery   . Other surgical history     s/p laparoscopic prostate surgery  . Skin cancer excision     a/p skin cancer right foot-non melanoma  .  Bladder suspension 08/18/10    [name]  . Eye surgery 09/24/10    left eye. "Retina surgery"    No family history on file.  Allergies  Allergen Reactions  . Lisinopril     REACTION: dizziness    Current Outpatient Prescriptions on File Prior to Visit  Medication Sig Dispense Refill  . Ascorbic Acid (VITAMIN C) 500 MG tablet Take 500 mg by mouth daily.        [name] aspirin 81 MG tablet Take 81 mg by mouth daily.        . bisoprolol (ZEBETA) 5 MG tablet 1/2 tablet by mouth once a day  75 tablet  0  . Multiple Vitamin (MULTIVITAMIN) tablet Take 1 tablet by mouth daily.        . Omega-3 Fatty Acids (FISH OIL) 500 MG CAPS Take 500 mg by mouth 2 (two) times daily.          BP 134/84  Pulse 72  Temp(Src) 98.1 F (36.7 C) (Oral)  Wt 150 lb (68.04 kg)       Objective:   Physical Exam   Constitutional: Appears well-developed and well-nourished. No distress.  Head: Normocephalic and atraumatic.  Neck: Normal range of motion. Neck supple. No thyromegaly present. No carotid bruit Cardiovascular: Normal rate, regular rhythm and normal heart sounds.  Exam reveals no gallop and no friction rub.  Pulmonary/Chest: Effort normal and breath sounds normal.  No wheezes. No rales.  Abdominal: Soft. Bowel sounds are normal. No mass. There is no tenderness.  Neurological: Alert. No cranial nerve deficit.  Skin: areas of hyperpigmentation of lower ext.  Bilateral varicosities Psychiatric: Normal mood and affect. Behavior is normal.     Assessment & Plan:

## 2011-02-10 NOTE — Assessment & Plan Note (Signed)
77 y/o with bilateral lower ext varicosities.  They are relatively asymptomatic for now.  I suggest pt use compression hose.   Especially with long plane rides to [place] and back.  We discussed symptoms of thrombophlebitis.  Pt to use aspirin and warm compresses.

## 2011-02-10 NOTE — Assessment & Plan Note (Signed)
Well controlled. Continue current medication regimen.  

## 2011-02-11 ENCOUNTER — Encounter: Payer: Self-pay | Admitting: Family

## 2011-02-11 DIAGNOSIS — R739 Hyperglycemia, unspecified: Secondary | ICD-10-CM

## 2011-02-11 HISTORY — DX: Hyperglycemia, unspecified: R73.9

## 2011-02-22 ENCOUNTER — Ambulatory Visit (INDEPENDENT_AMBULATORY_CARE_PROVIDER_SITE_OTHER): Payer: Medicare Other | Admitting: Family

## 2011-02-22 ENCOUNTER — Encounter: Payer: Self-pay | Admitting: Family

## 2011-02-22 DIAGNOSIS — L309 Dermatitis, unspecified: Secondary | ICD-10-CM

## 2011-02-22 DIAGNOSIS — R7309 Other abnormal glucose: Secondary | ICD-10-CM

## 2011-02-22 DIAGNOSIS — R209 Unspecified disturbances of skin sensation: Secondary | ICD-10-CM

## 2011-02-22 DIAGNOSIS — E785 Hyperlipidemia, unspecified: Secondary | ICD-10-CM

## 2011-02-22 DIAGNOSIS — R739 Hyperglycemia, unspecified: Secondary | ICD-10-CM

## 2011-02-22 DIAGNOSIS — R6889 Other general symptoms and signs: Secondary | ICD-10-CM

## 2011-02-22 DIAGNOSIS — L259 Unspecified contact dermatitis, unspecified cause: Secondary | ICD-10-CM

## 2011-02-22 MED ORDER — ALPROSTADIL (VASODILATOR) 125 MCG UR PLLT: 125.0000 ug | PELLET | URETHRAL | Status: DC | PRN

## 2011-02-22 MED ORDER — HYDROCORTISONE 2.5 % EX OINT: TOPICAL_OINTMENT | Freq: Two times a day (BID) | CUTANEOUS | Status: DC

## 2011-02-22 NOTE — Patient Instructions (Signed)
Please follow up after you return from [place].   Have a nice Holiday!

## 2011-02-22 NOTE — Progress Notes (Signed)
Subjective:    Patient ID: Jeremy Kelley, male    DOB: 1926/05/15, 77 y.o.   MRN: [id number]  HPI  Jeremy Kelley is an 77 yr old male who presents today for follow up.  1) Mild hyperglycemia- He was noted to have mild hyperglycemia on routine lab work.   2) Itching of back-10 day history of itching of lower back. Reports dry skin  3) Numbness in fingertips/feels cold.  Numbness is mild.   Review of Systems See HPI  Past Medical History  Diagnosis Date  . History of prostate cancer 2001  . Hypertension   . Colon polyps   . Urinary incontinence     chronic urinary incontinence after prostate surgery  . Cataract   . Diverticulosis   . Phimosis   . Carotid artery stenosis 11/2008    bilateral 40-59% stenosis    History   Social History  . Marital Status: Single    Spouse Name: N/A    Number of Children: N/A  . Years of Education: N/A   Occupational History  . Not on file.   Social History Main Topics  . Smoking status: Passive Smoker    Types: Cigars  . Smokeless tobacco: Not on file   Comment: occasionally smokes a cigar  . Alcohol Use: Yes     3-4 glasses of wine per week-no history of excessinve alcohol use  . Drug Use: Not on file  . Sexually Active: Not on file   Other Topics Concern  . Not on file   Social History Narrative   Patient is single, has never been married.  Does not have any children.  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  No history of excessive alcohol use.  Occasionally smokes a cigar.Spends winters in [place]         Past Surgical History  Procedure Date  . Inguinal hernia repair   . Varicose vein surgery   . Other surgical history     s/p laparoscopic prostate surgery  . Skin cancer excision     a/p skin cancer right foot-non melanoma  . Bladder suspension 08/18/10    [name]  . Eye surgery 09/24/10    left eye. "Retina surgery"    No family  history on file.  Allergies  Allergen Reactions  . Lisinopril     REACTION: dizziness    Current Outpatient Prescriptions on File Prior to Visit  Medication Sig Dispense Refill  . Ascorbic Acid (VITAMIN C) 500 MG tablet Take 500 mg by mouth daily.        [name] aspirin 81 MG tablet Take 81 mg by mouth daily.        . bisoprolol (ZEBETA) 5 MG tablet 1/2 tablet by mouth once a day  75 tablet  0  . Multiple Vitamin (MULTIVITAMIN) tablet Take 1 tablet by mouth daily.        . Omega-3 Fatty Acids (FISH OIL) 500 MG CAPS Take 500 mg by mouth 2 (two) times daily.        . rosuvastatin (CRESTOR) 5 MG tablet Take 1 tablet (5 mg total) by mouth at bedtime.  90 tablet  1    BP 130/82  Pulse 66  Temp(Src) 97.9 F (36.6 C) (Oral)  Resp 16  Ht 5\' 10"  (1.778 m)  Wt 147 lb 1.9 oz (66.733 kg)  BMI 21.11 kg/m2       Objective:   Physical  Exam  Constitutional: He appears well-developed and well-nourished.  Cardiovascular: Normal rate and regular rhythm.   No murmur heard. Pulmonary/Chest: Effort normal and breath sounds normal. No respiratory distress. He has no wheezes. He has no rales.  Skin: Skin is warm and dry.       Mild dry excoriated rash on lower back.  Psychiatric: He has a normal mood and affect. His behavior is normal.          Assessment & Plan:

## 2011-02-23 ENCOUNTER — Telehealth: Payer: Self-pay | Admitting: Family

## 2011-02-23 DIAGNOSIS — L309 Dermatitis, unspecified: Secondary | ICD-10-CM | POA: Insufficient documentation

## 2011-02-23 HISTORY — DX: Dermatitis, unspecified: L30.9

## 2011-02-23 NOTE — Assessment & Plan Note (Signed)
He told me that he could not afford the pravastatin that Dr. Artist Pais gave him.  He wishes to fill pravastatin instead. He was given printed rx for this last visit.

## 2011-02-23 NOTE — Assessment & Plan Note (Signed)
Eczema of lower back. Treat with emmolients and hydrocortisone.

## 2011-02-23 NOTE — Telephone Encounter (Signed)
Dear [name],  I reviewed your A1C (diabetes test) and it was slightly above normal.  You are not diabetic, but your sugars are running slightly higher than normal.  You should continue with a healthy diet and exercise as you discussed. We will keep an eye on this.  We also checked your thyroid and it was normal.  Have a nice trip.  Sincerely,  Sandford Craze FNP

## 2011-02-23 NOTE — Assessment & Plan Note (Signed)
A1C is 5.9.  We discussed avoiding concentrated sweets and continuing regular exercise.

## 2011-02-23 NOTE — Assessment & Plan Note (Signed)
Suspect mild carpal tunnel. TSH is normal. Monitor.

## 2011-09-17 DIAGNOSIS — N393 Stress incontinence (female) (male): Secondary | ICD-10-CM | POA: Diagnosis not present

## 2011-09-17 DIAGNOSIS — R310 Gross hematuria: Secondary | ICD-10-CM | POA: Diagnosis not present

## 2011-09-20 DIAGNOSIS — H5210 Myopia, unspecified eye: Secondary | ICD-10-CM | POA: Diagnosis not present

## 2011-09-20 DIAGNOSIS — H04129 Dry eye syndrome of unspecified lacrimal gland: Secondary | ICD-10-CM | POA: Diagnosis not present

## 2011-09-20 DIAGNOSIS — H43819 Vitreous degeneration, unspecified eye: Secondary | ICD-10-CM | POA: Diagnosis not present

## 2011-09-20 DIAGNOSIS — H35379 Puckering of macula, unspecified eye: Secondary | ICD-10-CM | POA: Diagnosis not present

## 2011-09-21 ENCOUNTER — Ambulatory Visit (INDEPENDENT_AMBULATORY_CARE_PROVIDER_SITE_OTHER): Payer: Medicare Other | Admitting: Internal Medicine

## 2011-09-21 ENCOUNTER — Other Ambulatory Visit: Payer: Self-pay | Admitting: Urology

## 2011-09-21 VITALS — BP 130/82 | HR 64 | Temp 98.3°F | Ht 70.0 in | Wt 147.0 lb

## 2011-09-21 DIAGNOSIS — R197 Diarrhea, unspecified: Secondary | ICD-10-CM | POA: Diagnosis not present

## 2011-09-21 DIAGNOSIS — R109 Unspecified abdominal pain: Secondary | ICD-10-CM

## 2011-09-21 LAB — CBC WITH DIFFERENTIAL/PLATELET
Eosinophils Absolute: 0.2 10*3/uL (ref 0.0–0.7)
Eosinophils Relative: 2.8 % (ref 0.0–5.0)
MCHC: 33 g/dL (ref 30.0–36.0)
MCV: 96.1 fl (ref 78.0–100.0)
Monocytes Absolute: 0.4 10*3/uL (ref 0.1–1.0)
Neutrophils Relative %: 77.4 % — ABNORMAL HIGH (ref 43.0–77.0)
Platelets: 202 10*3/uL (ref 150.0–400.0)
WBC: 8.1 10*3/uL (ref 4.5–10.5)

## 2011-09-21 LAB — BASIC METABOLIC PANEL
BUN: 17 mg/dL (ref 6–23)
Calcium: 9.1 mg/dL (ref 8.4–10.5)
Chloride: 104 mEq/L (ref 96–112)
Creatinine, Ser: 1.1 mg/dL (ref 0.4–1.5)

## 2011-09-21 NOTE — Assessment & Plan Note (Signed)
78 year old male complains of intermittent diarrhea for the last 1-2 months. Question infectious etiology. He resides in [place] during winter months.   He [place].  Send stool for culture, ova and parasites and C. difficile PCR. Use Imodium and increase hydration for now.  Consider empiric antibiotics if symptoms worsen.

## 2011-09-21 NOTE — Progress Notes (Signed)
Subjective:    Patient ID: Jeremy Kelley, male    DOB: 1926/06/28, 78 y.o.   MRN: [id number]  HPI  78 year old white male complains of intermittent cramping and loose stools for 2-3 days.  Patient reports suffering from similar episode while he was still living in [place]. He was seen on May 10 for diarrhea/loose stools. He also had associated lower abdominal pain and some mucus in his stools. Abdominal pain has been much resolved. He was treated with Pepto-Bismol or some type of antispasmodic. His loose stools improved but never completely resolved.  Currently he is having nocturnal diarrhea. He denies fever or chills. Incidentally he reports eating large amount of nuts yesterday and several servings of ice cream.  He kayaks on a regular basis.  The is a strong possibility pt was exposed to contaminated water.  Review of Systems No fever or chills  Past Medical History  Diagnosis Date  . History of prostate cancer 2001  . Hypertension   . Colon polyps   . Urinary incontinence     chronic urinary incontinence after prostate surgery  . Cataract   . Diverticulosis   . Phimosis   . Carotid artery stenosis 11/2008    bilateral 40-59% stenosis    History   Social History  . Marital Status: Single    Spouse Name: N/A    Number of Children: N/A  . Years of Education: N/A   Occupational History  . Not on file.   Social History Main Topics  . Smoking status: Passive Smoker    Types: Cigars  . Smokeless tobacco: Not on file   Comment: occasionally smokes a cigar  . Alcohol Use: Yes     3-4 glasses of wine per week-no history of excessinve alcohol use  . Drug Use: Not on file  . Sexually Active: Not on file   Other Topics Concern  . Not on file   Social History Narrative   Patient is single, has never been married.  Does not have any children.  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  No history of  excessive alcohol use.  Occasionally smokes a cigar.Spends winters in [place]         Past Surgical History  Procedure Date  . Inguinal hernia repair   . Varicose vein surgery   . Other surgical history     s/p laparoscopic prostate surgery  . Skin cancer excision     a/p skin cancer right foot-non melanoma  . Bladder suspension 08/18/10    [name]  . Eye surgery 09/24/10    left eye. "Retina surgery"    No family history on file.  Allergies  Allergen Reactions  . Lisinopril     REACTION: dizziness    Current Outpatient Prescriptions on File Prior to Visit  Medication Sig Dispense Refill  . alprostadil (MUSE) 125 MCG pellet 1 each (125 mcg total) by Transurethral route as needed for erectile dysfunction. use no more than 3 times per week  6 each  0  . Ascorbic Acid (VITAMIN C) 500 MG tablet Take 500 mg by mouth daily.        [name] aspirin 81 MG tablet Take 81 mg by mouth daily.        . bisoprolol (ZEBETA) 5 MG tablet 1/2 tablet by mouth once a day  75 tablet  0  . Multiple Vitamin (MULTIVITAMIN) tablet Take 1 tablet by mouth daily.        [name]  Omega-3 Fatty Acids (FISH OIL) 500 MG CAPS Take 500 mg by mouth 2 (two) times daily.        . rosuvastatin (CRESTOR) 5 MG tablet Take 1 tablet (5 mg total) by mouth at bedtime.  90 tablet  1  . pravastatin (PRAVACHOL) 20 MG tablet Take 20 mg by mouth daily.          BP 130/82  Pulse 64  Temp(Src) 98.3 F (36.8 C) (Oral)  Ht 5\' 10"  (1.778 m)  Wt 147 lb (66.679 kg)  BMI 21.09 kg/m2       Objective:   Physical Exam  Constitutional: He is oriented to person, place, and time. He appears well-developed and well-nourished. No distress.  HENT:  Head: Normocephalic and atraumatic.  Cardiovascular: Normal rate and normal heart sounds.   Pulmonary/Chest: Effort normal and breath sounds normal. He has no wheezes.  Abdominal: Soft. Bowel sounds are normal.       Mild left lower quadrant tenderness  Musculoskeletal: He exhibits no  edema.  Neurological: He is alert and oriented to person, place, and time.  Skin: Skin is warm and dry.  Psychiatric: He has a normal mood and affect. His behavior is normal.      Assessment & Plan:

## 2011-09-21 NOTE — Patient Instructions (Addendum)
Use imodium over the counter as needed for loose stools Follow bland diet (bananas, rice, applesauce, dry toast).  Avoid high fat and high protein meals. Our office will contact you re: test results

## 2011-09-22 ENCOUNTER — Encounter: Payer: Medicare Other | Admitting: Internal Medicine

## 2011-09-24 DIAGNOSIS — D494 Neoplasm of unspecified behavior of bladder: Secondary | ICD-10-CM | POA: Diagnosis not present

## 2011-09-27 ENCOUNTER — Encounter (HOSPITAL_COMMUNITY): Payer: Self-pay | Admitting: Pharmacy Technician

## 2011-09-28 ENCOUNTER — Encounter (HOSPITAL_COMMUNITY)
Admission: RE | Admit: 2011-09-28 | Discharge: 2011-09-28 | Disposition: A | Discharge status: Home / Self Care | Payer: Medicare Other | Source: Ambulatory Visit | Attending: Urology | Admitting: Urology

## 2011-09-28 ENCOUNTER — Ambulatory Visit (HOSPITAL_COMMUNITY)
Admission: RE | Admit: 2011-09-28 | Discharge: 2011-09-28 | Disposition: A | Discharge status: Home / Self Care | Payer: Medicare Other | Source: Ambulatory Visit | Attending: Urology | Admitting: Urology

## 2011-09-28 ENCOUNTER — Encounter (HOSPITAL_COMMUNITY): Payer: Self-pay

## 2011-09-28 DIAGNOSIS — D494 Neoplasm of unspecified behavior of bladder: Secondary | ICD-10-CM | POA: Insufficient documentation

## 2011-09-28 DIAGNOSIS — R9431 Abnormal electrocardiogram [ECG] [EKG]: Secondary | ICD-10-CM | POA: Diagnosis not present

## 2011-09-28 DIAGNOSIS — I491 Atrial premature depolarization: Secondary | ICD-10-CM | POA: Insufficient documentation

## 2011-09-28 DIAGNOSIS — J984 Other disorders of lung: Secondary | ICD-10-CM | POA: Diagnosis not present

## 2011-09-28 DIAGNOSIS — Z01810 Encounter for preprocedural cardiovascular examination: Secondary | ICD-10-CM | POA: Insufficient documentation

## 2011-09-28 DIAGNOSIS — I498 Other specified cardiac arrhythmias: Secondary | ICD-10-CM | POA: Insufficient documentation

## 2011-09-28 DIAGNOSIS — J438 Other emphysema: Secondary | ICD-10-CM | POA: Diagnosis not present

## 2011-09-28 DIAGNOSIS — Z01811 Encounter for preprocedural respiratory examination: Secondary | ICD-10-CM | POA: Diagnosis not present

## 2011-09-28 DIAGNOSIS — Z01818 Encounter for other preprocedural examination: Secondary | ICD-10-CM | POA: Insufficient documentation

## 2011-09-28 DIAGNOSIS — I452 Bifascicular block: Secondary | ICD-10-CM | POA: Insufficient documentation

## 2011-09-28 DIAGNOSIS — Z01812 Encounter for preprocedural laboratory examination: Secondary | ICD-10-CM | POA: Insufficient documentation

## 2011-09-28 HISTORY — DX: Inflammatory liver disease, unspecified: K75.9

## 2011-09-28 LAB — COMPREHENSIVE METABOLIC PANEL
Alkaline Phosphatase: 65 U/L (ref 39–117)
BUN: 15 mg/dL (ref 6–23)
GFR calc Af Amer: 74 mL/min — ABNORMAL LOW (ref >90)
GFR calc non Af Amer: 64 mL/min — ABNORMAL LOW (ref >90)
Glucose, Bld: 106 mg/dL — ABNORMAL HIGH (ref 70–99)
Potassium: 4 mEq/L (ref 3.5–5.1)
Total Bilirubin: 0.8 mg/dL (ref 0.3–1.2)
Total Protein: 6.5 g/dL (ref 6.0–8.3)

## 2011-09-28 LAB — CBC
HCT: 41.6 % (ref 39.0–52.0)
Hemoglobin: 13.6 g/dL (ref 13.0–17.0)
MCHC: 32.7 g/dL (ref 30.0–36.0)

## 2011-09-28 LAB — CLOSTRIDIUM DIFFICILE BY PCR: Toxigenic C. Difficile by PCR: NOT DETECTED

## 2011-09-28 LAB — SURGICAL PCR SCREEN: Staphylococcus aureus: NEGATIVE

## 2011-09-28 LAB — STOOL CULTURE

## 2011-09-28 NOTE — Patient Instructions (Addendum)
20 Jeremy Kelley  09/28/2011   Your procedure is scheduled on:  10/01/11 300pm-400pm  Report to [facility] at 1230 AM.  Call this number if you have problems the morning of surgery: [phone]   Remember:   Do not eat food:After Midnight.  May have clear liquids:until 0900am then npo .  Clear liquids include soda, tea, black coffee, apple or grape juice, broth.  Take these medicines the morning of surgery with A SIP OF WATER:    Do not wear jewelry,   Do not wear lotions, powders, or perfumes.  . Men may shave face and neck.  Do not bring valuables to the hospital.  Contacts, dentures or bridgework may not be worn into surgery.  Leave suitcase in the car. After surgery it may be brought to your room.  For patients admitted to the hospital, checkout time is 11:00 AM the day of discharge.     Special Instructions: CHG Shower Use Special Wash: 1/2 bottle night before surgery and 1/2 bottle morning of surgery. shower chin to toes with CHG.  Wash face and private parts with regular soap.   Please read over the following fact sheets that you were given: MRSA Information, coughing and deep breathing exercises, leg exercises

## 2011-09-29 ENCOUNTER — Ambulatory Visit (INDEPENDENT_AMBULATORY_CARE_PROVIDER_SITE_OTHER): Payer: Medicare Other | Admitting: Internal Medicine

## 2011-09-29 VITALS — BP 142/84 | Temp 98.2°F | Ht 68.0 in | Wt 143.0 lb

## 2011-09-29 DIAGNOSIS — R197 Diarrhea, unspecified: Secondary | ICD-10-CM

## 2011-09-29 MED ORDER — METRONIDAZOLE 500 MG PO TABS: 500.0000 mg | ORAL_TABLET | Freq: Three times a day (TID) | ORAL | Status: DC

## 2011-09-29 NOTE — Progress Notes (Signed)
Subjective:    Patient ID: Jeremy Kelley, male    DOB: 05/07/1926, 78 y.o.   MRN: [id number]  HPI  78 year old white male previously seen for chronic diarrhea for followup. His stool studies showed negative stool cultures. He was also negative for C. difficile. However ova and parasites were positive for-ENDOLIMAX NANA, Trophozoites and Cysts,  Abundant  BLASTOCYSTIS HOMINIS.  As previously noted, the patient Jeremy Kelley in potentially contaminated rivers in [place].  He is schedule for possible tumor biopsy this Friday.   Review of Systems Diarrhea has improved with imodium  Past Medical History  Diagnosis Date  . History of prostate cancer 2001  . Hypertension   . Colon polyps   . Urinary incontinence     chronic urinary incontinence after prostate surgery  . Cataract   . Diverticulosis   . Phimosis   . Carotid artery stenosis 11/2008    bilateral 40-59% stenosis  . Chronic kidney disease     bladder tumor   . Hepatitis     History   Social History  . Marital Status: Single    Spouse Name: N/A    Number of Children: N/A  . Years of Education: N/A   Occupational History  . Not on file.   Social History Main Topics  . Smoking status: Passive Smoker    Types: Cigars  . Smokeless tobacco: Never Used   Comment: occasionally smokes a cigar  . Alcohol Use: Yes     3-4 glasses of wine per week-no history of excessinve alcohol use  . Drug Use: No  . Sexually Active: Not on file   Other Topics Concern  . Not on file   Social History Narrative   Patient is single, has never been married.  Does not have any children.  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  No history of excessive alcohol use.  Occasionally smokes a cigar.Spends winters in [place]         Past Surgical History  Procedure Date  . Inguinal hernia repair   . Varicose vein surgery   . Other surgical history     s/p laparoscopic  prostate surgery  . Skin cancer excision     a/p skin cancer right foot-non melanoma  . Bladder suspension 08/18/10    [name]  . Eye surgery 09/24/10    left eye. "Retina surgery"    No family history on file.  Allergies  Allergen Reactions  . Lisinopril     REACTION: dizziness    Current Outpatient Prescriptions on File Prior to Visit  Medication Sig Dispense Refill  . alprostadil (MUSE) 125 MCG pellet 1 each (125 mcg total) by Transurethral route as needed for erectile dysfunction. use no more than 3 times per week  6 each  0  . Ascorbic Acid (VITAMIN C) 500 MG tablet Take 500 mg by mouth daily with breakfast.       . aspirin 81 MG tablet Take 81 mg by mouth daily with breakfast.       . bisoprolol (ZEBETA) 5 MG tablet Take 2.5 mg by mouth at bedtime. 1/2 tablet by mouth once a day      . Multiple Vitamin (MULTIVITAMIN) tablet Take 1 tablet by mouth daily with breakfast.       . Omega-3 Fatty Acids (FISH OIL) 500 MG CAPS Take 500 mg by mouth 2 (two) times daily.       . pravastatin (  PRAVACHOL) 20 MG tablet Take 20 mg by mouth daily with breakfast.         BP 142/84  Temp 98.2 F (36.8 C) (Oral)  Ht 5\' 8"  (1.727 m)  Wt 143 lb (64.864 kg)  BMI 21.74 kg/m2       Objective:   Physical Exam  Constitutional: He appears well-developed and well-nourished.  Cardiovascular: Normal rate, regular rhythm and normal heart sounds.   Pulmonary/Chest: Effort normal and breath sounds normal. He has no wheezes. He has no rales.  Abdominal: Soft. Bowel sounds are normal. There is no tenderness.       Assessment & Plan:

## 2011-09-29 NOTE — Assessment & Plan Note (Signed)
Patient has non pathogenic parasites in stool study. However he is symptomatic. We agreed to treat with metronidazole 500 mg 3 times a day for 10 days.  Patient advised to call office if symptoms persist or worsen.

## 2011-09-30 NOTE — H&P (Signed)
History of Present Illness  Jeremy Kelley is an 78 year old gentleman with the following urologic history:  1) Prostate cancer: He is s/p a laparoscopic radical prostatectomy by Dr. Osvaldo Human in [place] in August 2001. His PSA has thus far been undetectable since treatment.  2) Incontinence: He has mixed urge and stress incontinence which is very mild. He uses oxybutynin 15 mg for treatment which has benefited him although he typically wore one pad per day. He is s/p a male sling by Dr. Sherron Monday in May 2012.     3) Erectile dysfunction: He was not responsive to PDE-5 inhibitors.  He uses MUSE 125 mcg prn with good success.  4) Bladder polyp: He underwent a TUR for a bladder polyp in 2004. July 2008: Negative cysto  5) Balanoposthitis: He has used Lotrisone cream and vaseline prn.  Interval history:  He follows up today after his recent visit with Dr. Sherron Monday. He had one episode of hematuria and therefore was seen by Dr. Sherron Monday to ensure he did not have any complications related to his prior male sling surgery which was performed last spring. Fortunately, he did not appear to have any evidence for erosion on cystoscopy. However, he was noted incidentally to have a small bladder lesion at the time of cystoscopy possibly consistent with a benign polyp or low grade papillary tumor. He therefore has been sent back to me to discuss treatment for this lesion. Currently, he denies any hematuria or other voiding symptoms. He remains in excellent overall health with known 2 medical problems diagnosed since his last visit.   Past Medical History Problems  1. History of  Prostate Cancer V10.46  Surgical History Problems  1. History of  Hernia Repair 2. History of  Operation For Correction Of Male Urinary Incontinence 3. History of  Prostatect Retropubic Radical W/ Nerve Sparing Laparoscopic 4. History of  Varicose Vein Ligation  Current Meds 1. Adult Aspirin Low Strength 81 MG Oral Tablet  Dispersible; Therapy: (Recorded:17Jun2009) to 2. Amlodipine Besy-Benazepril HCl 5-10 MG Oral Capsule; Therapy: (Recorded:17Jun2009) to 3. Bisoprolol Fumarate 5 MG Oral Tablet; Therapy: 19Oct2011 to 4. BL Vitamin C 500 MG TABS; Therapy: (Recorded:17Jun2009) to 5. Cephalexin 500 MG Oral Capsule; 500 mg tid for 7 days; Therapy: 20Jun2012 to (Last  Rx:20Jun2012) 6. Clotrimazole-Betamethasone 1-0.05 % External Cream; Therapy: (Recorded:31Jul2008) to 7. Cymbalta 30 MG Oral Capsule Delayed Release Particles; Therapy: (Recorded:05Mar2012) to 8. Daily Multiple Vitamins TABS; Therapy: (Recorded:17Jun2009) to 9. Muse 125 MCG Urethral Pellet; USE 1 PELLET AS NEEDED ASDIRECTED; Therapy: 18Jun2010  to (Evaluate:30Aug2012); Last Rx:13Jun2012 10. Pravastatin Sodium 20 MG Oral Tablet; Therapy: 19Oct2011 to  Allergies Medication  1. No Known Drug Allergies  Family History Denied  1. Family history of  Family Health Status Number Of Children  Social History Problems  1. Alcohol Use drinks wine 2. Caffeine Use daily 3. Family history of  Death In The Family Father age 57; ? 4. Family history of  Death In The Family Mother age 59;? 5. Marital History - Single 6. Never A Smoker 7. Occupation: retired Denied  8. Tobacco Use  Review of Systems  Genitourinary: no hematuria.  Constitutional: no fever.  Cardiovascular: no leg swelling.  Musculoskeletal: no back pain.    Vitals Vital Signs [Data Includes: Last 1 Day]  14Jun2013 09:25AM  BMI Calculated: 20.76 BSA Calculated: 1.82 Height: 5 ft 10 in Weight: 145 lb  Blood Pressure: 183 / 67 Heart Rate: 55  Physical Exam Constitutional: Well nourished and well developed . No acute  distress.  ENT:. The ears and nose are normal in appearance.  Neck: The appearance of the neck is normal and no neck mass is present.  Pulmonary: No respiratory distress and normal respiratory rhythm and effort.  Cardiovascular: Heart rate and rhythm are normal  . No peripheral edema.  Skin: Normal skin turgor, no visible rash and no visible skin lesions.  Neuro/Psych:. Mood and affect are appropriate.    Results/Data Urine [Data Includes: Last 1 Day]   14Jun2013 COLOR YELLOW  APPEARANCE CLEAR  SPECIFIC GRAVITY 1.025  pH 6.0  GLUCOSE NEG mg/dL BILIRUBIN NEG  KETONE NEG mg/dL BLOOD NEG  PROTEIN TRACE mg/dL UROBILINOGEN 1 mg/dL NITRITE NEG  LEUKOCYTE ESTERASE NEG   Assessment Assessed  1. 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 UA With REFLEX  Done: 14Jun2013 09:19AM Neoplasm Of The Bladder (239.4)  2. Follow-up Schedule Surgery Office  Follow-up  Requested for: 14Jun2013  Discussion/Summary  1. Bladder neoplasm possibly concerning for malignancy: I recommended that he undergo cystoscopy, bilateral retrograde pyelography, and transurethral resection of his bladder tumor for diagnostic and treatment purposes. I outlined that procedure in detail today as well as expected recovery process. We reviewed the potential risks and complications associated with this procedure in detail. He gives his informed consent to proceed. I will plan to keep him overnight for 23 hour observation considering that he lives alone.  2. Prostate cancer: He will continue followup for surveillance and I will plan to check a PSA in the near future as he would be due for surveillance PSA at this time.  3. Incontinence: This has been improved after his male sling. We did discuss the possibility although slight that this procedure could worsen his incontinence.  Cc: Dr. Lorin Picket MacDiarmid Dr. Thomos Lemons     Signatures Electronically signed by : Heloise Purpura, M.D.; Sep 24 2011 10:45AM

## 2011-10-01 ENCOUNTER — Encounter (HOSPITAL_COMMUNITY): Payer: Self-pay | Admitting: Anesthesiology

## 2011-10-01 ENCOUNTER — Encounter (HOSPITAL_COMMUNITY): Payer: Self-pay | Admitting: *Deleted

## 2011-10-01 ENCOUNTER — Ambulatory Visit (HOSPITAL_COMMUNITY): Payer: Medicare Other | Admitting: Anesthesiology

## 2011-10-01 ENCOUNTER — Ambulatory Visit (HOSPITAL_COMMUNITY)
Admission: RE | Admit: 2011-10-01 | Discharge: 2011-10-02 | Disposition: A | Discharge status: Home / Self Care | Payer: Medicare Other | Source: Ambulatory Visit | Attending: Urology | Admitting: Urology

## 2011-10-01 ENCOUNTER — Encounter (HOSPITAL_COMMUNITY)
Admission: RE | Disposition: A | Discharge status: Home / Self Care | Payer: Self-pay | Source: Ambulatory Visit | Attending: Urology

## 2011-10-01 DIAGNOSIS — Z7982 Long term (current) use of aspirin: Secondary | ICD-10-CM | POA: Diagnosis not present

## 2011-10-01 DIAGNOSIS — N3946 Mixed incontinence: Secondary | ICD-10-CM | POA: Diagnosis not present

## 2011-10-01 DIAGNOSIS — N476 Balanoposthitis: Secondary | ICD-10-CM | POA: Diagnosis not present

## 2011-10-01 DIAGNOSIS — Z8546 Personal history of malignant neoplasm of prostate: Secondary | ICD-10-CM | POA: Diagnosis not present

## 2011-10-01 DIAGNOSIS — I10 Essential (primary) hypertension: Secondary | ICD-10-CM | POA: Diagnosis not present

## 2011-10-01 DIAGNOSIS — C669 Malignant neoplasm of unspecified ureter: Secondary | ICD-10-CM | POA: Diagnosis not present

## 2011-10-01 DIAGNOSIS — N189 Chronic kidney disease, unspecified: Secondary | ICD-10-CM | POA: Diagnosis not present

## 2011-10-01 DIAGNOSIS — C675 Malignant neoplasm of bladder neck: Secondary | ICD-10-CM | POA: Diagnosis not present

## 2011-10-01 DIAGNOSIS — C676 Malignant neoplasm of ureteric orifice: Secondary | ICD-10-CM | POA: Diagnosis not present

## 2011-10-01 DIAGNOSIS — N529 Male erectile dysfunction, unspecified: Secondary | ICD-10-CM | POA: Insufficient documentation

## 2011-10-01 DIAGNOSIS — Z79899 Other long term (current) drug therapy: Secondary | ICD-10-CM | POA: Insufficient documentation

## 2011-10-01 DIAGNOSIS — C679 Malignant neoplasm of bladder, unspecified: Secondary | ICD-10-CM | POA: Diagnosis not present

## 2011-10-01 DIAGNOSIS — D494 Neoplasm of unspecified behavior of bladder: Secondary | ICD-10-CM | POA: Diagnosis not present

## 2011-10-01 DIAGNOSIS — D126 Benign neoplasm of colon, unspecified: Secondary | ICD-10-CM | POA: Diagnosis not present

## 2011-10-01 HISTORY — PX: TRANSURETHRAL RESECTION OF BLADDER TUMOR: SHX2575

## 2011-10-01 HISTORY — PX: BIOPSY: SHX5522

## 2011-10-01 SURGERY — TURBT (TRANSURETHRAL RESECTION OF BLADDER TUMOR)
Anesthesia: General | Wound class: Clean Contaminated

## 2011-10-01 MED ORDER — LIDOCAINE HCL (CARDIAC) 20 MG/ML IV SOLN
INTRAVENOUS | Status: DC | PRN
  Administered 2011-10-01: 50 mg via INTRAVENOUS

## 2011-10-01 MED ORDER — SODIUM CHLORIDE 0.9 % IJ SOLN: 3.0000 mL | INTRAMUSCULAR | Status: DC | PRN

## 2011-10-01 MED ORDER — ONDANSETRON HCL 4 MG/2ML IJ SOLN
INTRAMUSCULAR | Status: DC | PRN
  Administered 2011-10-01: 4 mg via INTRAVENOUS

## 2011-10-01 MED ORDER — CIPROFLOXACIN IN D5W 400 MG/200ML IV SOLN
INTRAVENOUS | Status: AC
  Filled 2011-10-01: qty 200

## 2011-10-01 MED ORDER — CIPROFLOXACIN IN D5W 400 MG/200ML IV SOLN
400.0000 mg | INTRAVENOUS | Status: AC
  Administered 2011-10-01: 400 mg via INTRAVENOUS

## 2011-10-01 MED ORDER — ACETAMINOPHEN 10 MG/ML IV SOLN
INTRAVENOUS | Status: DC | PRN
  Administered 2011-10-01: 1000 mg via INTRAVENOUS

## 2011-10-01 MED ORDER — ACETAMINOPHEN 10 MG/ML IV SOLN
INTRAVENOUS | Status: AC
  Filled 2011-10-01: qty 100

## 2011-10-01 MED ORDER — BISOPROLOL FUMARATE 5 MG PO TABS
2.5000 mg | ORAL_TABLET | Freq: Every day | ORAL | Status: DC
  Administered 2011-10-01: 2.5 mg via ORAL
  Filled 2011-10-01 (×2): qty 0.5

## 2011-10-01 MED ORDER — SODIUM CHLORIDE 0.9 % IJ SOLN
3.0000 mL | Freq: Two times a day (BID) | INTRAMUSCULAR | Status: DC
  Administered 2011-10-01: 3 mL via INTRAVENOUS

## 2011-10-01 MED ORDER — SIMVASTATIN 10 MG PO TABS
10.0000 mg | ORAL_TABLET | Freq: Every day | ORAL | Status: DC
  Administered 2011-10-01: 10 mg via ORAL
  Filled 2011-10-01 (×2): qty 1

## 2011-10-01 MED ORDER — SODIUM CHLORIDE 0.9 % IV SOLN: 250.0000 mL | INTRAVENOUS | Status: DC | PRN

## 2011-10-01 MED ORDER — FENTANYL CITRATE 0.05 MG/ML IJ SOLN
INTRAMUSCULAR | Status: DC | PRN
  Administered 2011-10-01 (×3): 25 ug via INTRAVENOUS

## 2011-10-01 MED ORDER — IOHEXOL 300 MG/ML  SOLN
INTRAMUSCULAR | Status: AC
  Filled 2011-10-01: qty 1

## 2011-10-01 MED ORDER — LACTATED RINGERS IV SOLN
INTRAVENOUS | Status: DC
  Administered 2011-10-01: 1000 mL via INTRAVENOUS

## 2011-10-01 MED ORDER — HYDROCODONE-ACETAMINOPHEN 5-325 MG PO TABS
1.0000 | ORAL_TABLET | ORAL | Status: DC | PRN
  Administered 2011-10-01: 1 via ORAL
  Administered 2011-10-01: 2 via ORAL
  Administered 2011-10-01: 1 via ORAL
  Administered 2011-10-02: 2 via ORAL
  Filled 2011-10-01: qty 1
  Filled 2011-10-01: qty 2
  Filled 2011-10-01: qty 1
  Filled 2011-10-01: qty 2

## 2011-10-01 MED ORDER — MIDAZOLAM HCL 5 MG/5ML IJ SOLN
INTRAMUSCULAR | Status: DC | PRN
  Administered 2011-10-01: 1 mg via INTRAVENOUS

## 2011-10-01 MED ORDER — IOHEXOL 300 MG/ML  SOLN
INTRAMUSCULAR | Status: DC | PRN
  Administered 2011-10-01: 26 mL via INTRAVENOUS

## 2011-10-01 MED ORDER — SODIUM CHLORIDE 0.9 % IR SOLN
Status: DC | PRN
  Administered 2011-10-01: 3000 mL

## 2011-10-01 MED ORDER — HYDROMORPHONE HCL PF 1 MG/ML IJ SOLN: 0.2500 mg | INTRAMUSCULAR | Status: DC | PRN

## 2011-10-01 MED ORDER — PROPOFOL 10 MG/ML IV EMUL
INTRAVENOUS | Status: DC | PRN
  Administered 2011-10-01: 125 mg via INTRAVENOUS

## 2011-10-01 SURGICAL SUPPLY — 28 items
ADAPTER CATH URET PLST 4-6FR (CATHETERS) ×5 IMPLANT
ADPR CATH URET STRL DISP 4-6FR (CATHETERS) ×4
BAG URINE DRAINAGE (UROLOGICAL SUPPLIES) IMPLANT
BAG URO CATCHER STRL LF (DRAPE) ×5 IMPLANT
CATH INTERMIT  6FR 70CM (CATHETERS) IMPLANT
CATH URET 5FR 28IN CONE TIP (BALLOONS)
CATH URET 5FR 70CM CONE TIP (BALLOONS) IMPLANT
CLOTH BEACON ORANGE TIMEOUT ST (SAFETY) ×5 IMPLANT
DRAPE CAMERA CLOSED 9X96 (DRAPES) ×5 IMPLANT
ELECT LOOP MED HF 24F 12D CBL (CLIP) ×2 IMPLANT
ELECT REM PT RETURN 9FT ADLT (ELECTROSURGICAL) ×5
ELECTRODE REM PT RTRN 9FT ADLT (ELECTROSURGICAL) ×4 IMPLANT
EVACUATOR MICROVAS BLADDER (UROLOGICAL SUPPLIES) IMPLANT
FORCEPS BIOP 2.4F 115CM BACKLD (INSTRUMENTS) ×2 IMPLANT
GLOVE BIOGEL M STRL SZ7.5 (GLOVE) ×5 IMPLANT
GOWN STRL NON-REIN LRG LVL3 (GOWN DISPOSABLE) ×10 IMPLANT
GUIDEWIRE STR DUAL SENSOR (WIRE) ×7 IMPLANT
KIT ASPIRATION TUBING (SET/KITS/TRAYS/PACK) IMPLANT
LASER FIBER DISP (UROLOGICAL SUPPLIES) ×2 IMPLANT
LOOPS RESECTOSCOPE DISP (ELECTROSURGICAL) ×5 IMPLANT
MANIFOLD NEPTUNE II (INSTRUMENTS) ×5 IMPLANT
NS IRRIG 1000ML POUR BTL (IV SOLUTION) IMPLANT
PACK CYSTO (CUSTOM PROCEDURE TRAY) ×5 IMPLANT
SHEATH ACCESS URETERAL 24CM (SHEATH) ×2 IMPLANT
STENT CONTOUR 6FRX24X.038 (STENTS) ×2 IMPLANT
SYRINGE IRR TOOMEY STRL 70CC (SYRINGE) IMPLANT
TUBING CONNECTING 10 (TUBING) ×5 IMPLANT
WIRE COONS/BENSON .038X145CM (WIRE) ×5 IMPLANT

## 2011-10-01 NOTE — Interval H&P Note (Signed)
History and Physical Interval Note:  10/01/2011 2:50 PM  Jeremy Kelley  has presented today for surgery, with the diagnosis of BLADDER TUMOR  The various methods of treatment have been discussed with the patient and family. After consideration of risks, benefits and other options for treatment, the patient has consented to  Procedure(s) (LRB): TRANSURETHRAL RESECTION OF BLADDER TUMOR (TURBT) (N/A) CYSTOSCOPY WITH RETROGRADE PYELOGRAM (Bilateral) as a surgical intervention .  The patient's history has been reviewed, patient examined, no change in status, stable for surgery.  I have reviewed the patients' chart and labs.  Questions were answered to the patient's satisfaction.     Teala Daffron,LES

## 2011-10-01 NOTE — Discharge Instructions (Addendum)
1. You may see some blood in the urine and may have some burning with urination for 48-72 hours. You also may notice that you have to urinate more frequently or urgently after your procedure which is normal.  2. You should call should you develop an inability urinate, fever > 101, persistent nausea and vomiting that prevents you from eating or drinking to stay hydrated.  3. If you have a stent, you will likely urinate more frequently and urgently until the stent is removed and you may experience some discomfort/pain in the lower abdomen and flank especially when urinating. You may take pain medication prescribed to you if needed for pain. You may also intermittently have blood in the urine until the stent is removed.   Hold your aspirin for 5 days or until bleeding in urinary tract stops.   Prescriptions for hydrocodone (take as needed for pain) and ciprofloxacin (antibiotic) have been sent to your pharmacy at [facility] on [facility] [address] [facility]

## 2011-10-01 NOTE — Discharge Summary (Signed)
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 Briefly, Jeremy Kelley is a 78 y.o. year old patient with a bladder tumor diagnosed on office cystoscopy performed due to hematuria.   Hospital Course: He was taken to the OR and underwent a TURBT of his bladder tumor.  He also had bilateral RPGs performed to evaluate his upper urinary tract.  This demonstrated a left ureteral filling defect and he underwent resection and laser ablation of his left ureteral tumor. A left ureteral stent was placed.  He was monitored in the hospital overnight and remained stable.  He was able to be discharged home the following morning.  Laboratory values: No results found for this basename: HGB:3,HCT:3 in the last 72 hours No results found for this basename: CREATININE:2 in the last 72 hours  Disposition: Home  Discharge instruction: The patient was instructed to be ambulatory but told to refrain from heavy lifting.  Discharge medications:  Medication List  As of 10/01/2011  6:53 PM   CONTINUE taking these medications         alprostadil 125 MCG pellet   Commonly known as: MUSE   1 each (125 mcg total) by Transurethral route as needed for erectile dysfunction. use no more than 3 times per week      bisoprolol 5 MG tablet   Commonly known as: ZEBETA      Fish Oil 500 MG Caps      metroNIDAZOLE 500 MG tablet   Commonly known as: FLAGYL   Take 1 tablet (500 mg total) by mouth 3 (three) times daily.      multivitamin tablet      pravastatin 20 MG tablet   Commonly known as: PRAVACHOL      vitamin C 500 MG tablet   Commonly known as: ASCORBIC ACID         STOP taking these medications         aspirin 81 MG tablet            Followup:  Follow-up Information    Follow  up with Crecencio Mc, MD. (10/19/11 at 9:45)    Contact information:   [address] [place], [address] [facility] [place] [address] [phone]

## 2011-10-01 NOTE — Progress Notes (Signed)
Patient ID: Jeremy Kelley, male   DOB: 08/06/1926, 78 y.o.   MRN: [id number]  Post-op note  Subjective: The patient is doing well. He has hematuria as expected.  Objective: Vital signs in last 24 hours: Temp:  [96 F (35.6 C)-97.9 F (36.6 C)] 96 F (35.6 C) (06/21 1726) Pulse Rate:  [41-50] 47  (06/21 1726) Resp:  [11-18] 16  (06/21 1726) BP: (150-194)/(60-88) 172/88 mmHg (06/21 1726) SpO2:  [99 %-100 %] 100 % (06/21 1726) Weight:  [64.864 kg (143 lb)] 64.864 kg (143 lb) (06/21 1802)  Intake/Output from previous day:   Intake/Output this shift: Total I/O In: 550 [I.V.:550] Out: -   Physical Exam:  General: Alert and oriented. Abdomen: Soft, Nondistended. Incisions: Clean and dry.  Lab Results: No results found for this basename: HGB:3,HCT:3 in the last 72 hours  Assessment/Plan: POD#0   1) Continue to monitor 2) I reviewed operative findings with him and what to expect with the stent.  This will be removed in a few weeks at his followup.   Rolly Salter, Montez Hageman. MD   LOS: 0 days   Diem Pagnotta,LES 10/01/2011, 6:59 PM

## 2011-10-01 NOTE — Anesthesia Postprocedure Evaluation (Signed)
  Anesthesia Post-op Note  Patient: Jeremy Kelley  Procedure(s) Performed: Procedure(s) (LRB): TRANSURETHRAL RESECTION OF BLADDER TUMOR (TURBT) (N/A) CYSTOSCOPY WITH RETROGRADE PYELOGRAM, URETEROSCOPY AND STENT PLACEMENT (Left) HOLMIUM LASER APPLICATION () BIOPSY ()  Patient Location: PACU  Anesthesia Type: General  Level of Consciousness: oriented and sedated  Airway and Oxygen Therapy: Patient Spontanous Breathing and Patient connected to nasal cannula oxygen  Post-op Pain: mild  Post-op Assessment: Post-op Vital signs reviewed, Patient's Cardiovascular Status Stable, Respiratory Function Stable and Patent Airway  Post-op Vital Signs: stable  Complications: No apparent anesthesia complications

## 2011-10-01 NOTE — Anesthesia Preprocedure Evaluation (Signed)
Anesthesia Evaluation  Patient identified by MRN, date of birth, ID band Patient awake  General Assessment Comment:Advanced yrs  Reviewed: Allergy & Precautions, H&P , NPO status , Patient's Chart, lab work & pertinent test results, reviewed documented beta blocker date and time   Airway Mallampati: II TM Distance: >3 FB Neck ROM: Full    Dental  (+) Upper Dentures, Poor Dentition and Dental Advisory Given   Pulmonary neg pulmonary ROS,  breath sounds clear to auscultation        Cardiovascular hypertension, Pt. on medications Rhythm:Regular Rate:Normal     Neuro/Psych negative neurological ROS  negative psych ROS   GI/Hepatic negative GI ROS, (+) Hepatitis -, AHep A in 1960's   Endo/Other  negative endocrine ROS  Renal/GU negative Renal ROS   Bladder tumor Hx prostate cancer    Musculoskeletal negative musculoskeletal ROS (+)   Abdominal   Peds negative pediatric ROS (+)  Hematology negative hematology ROS (+)   Anesthesia Other Findings   Reproductive/Obstetrics negative OB ROS                           Anesthesia Physical Anesthesia Plan  ASA: III  Anesthesia Plan: General   Post-op Pain Management:    Induction: Intravenous  Airway Management Planned: LMA  Additional Equipment:   Intra-op Plan:   Post-operative Plan: Extubation in OR  Informed Consent: I have reviewed the patients History and Physical, chart, labs and discussed the procedure including the risks, benefits and alternatives for the proposed anesthesia with the patient or authorized representative who has indicated his/her understanding and acceptance.   Dental advisory given  Plan Discussed with: CRNA and Surgeon  Anesthesia Plan Comments: (Pt refuses regional)        Anesthesia Quick Evaluation

## 2011-10-01 NOTE — Transfer of Care (Signed)
Immediate Anesthesia Transfer of Care Note  Patient: Jeremy Kelley  Procedure(s) Performed: Procedure(s) (LRB): TRANSURETHRAL RESECTION OF BLADDER TUMOR (TURBT) (N/A) CYSTOSCOPY WITH RETROGRADE PYELOGRAM, URETEROSCOPY AND STENT PLACEMENT (Left) HOLMIUM LASER APPLICATION () BIOPSY ()  Patient Location: PACU  Anesthesia Type: General  Level of Consciousness: awake, alert  and oriented  Airway & Oxygen Therapy: Patient Spontanous Breathing and Patient connected to face mask oxygen  Post-op Assessment: Report given to PACU RN and Post -op Vital signs reviewed and stable  Post vital signs: Reviewed and stable  Complications: No apparent anesthesia complications

## 2011-10-01 NOTE — Op Note (Addendum)
Preoperative diagnosis: Bladder tumor  Postoperative diagnosis: 1. Bladder tumor 2. Left ureteral tumor  Procedures: 1. Cystoscopy 2. Bilateral retrograde pyelography with interpretation 3. Left ureteroscopy with biopsy of left ureteral tumor 4. Left ureteroscopic laser ablation of left ureteral tumor 5. Left ureteral stent placement (6 x 24) 6. Transurethral resection of bladder tumor ( 3 cm)  Surgeon: Dr. Rolly Salter, Montez Hageman.  Anesthesia: General  Complications: None  Estimated blood loss: Minimal  Specimens: 1. Bladder tumor 2. Base of bladder tumor 3. Left ureteral tumor  Disposition specimens: To pathology  Intraoperative findings: Right retrograde pyelography revealed a normal ureter without filling defects. The right renal collecting system was also normal without filling defects. The left ureter demonstrated a fixed filling defect in the distal left ureter. 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  Indication: Jeremy Kelley is an 78 year old gentleman who was recently found to have gross hematuria and a bladder tumor on office cystoscopy. It was recommended that he undergo transurethral resection of this bladder tumor. The potential risks, palpitations, and alternative options as well as the expected recovery process were discussed with the patient. Informed consent was given by the patient to proceed.  Description of procedure: The patient was taken to the operating room and a general anesthetic was administered. He was given preoperative antibiotics, placed in the dorsal lithotomy position, and prepped and draped in the usual sterile fashion. Next a preoperative timeout was performed. Cystourethroscopy was then performed which revealed a normal anterior urethra. The patient had undergone a prior male sling and there is no evidence of sling erosion. An inspection of the bladder systematically was  performed. He did have some small diverticuli and mild trabeculation throughout the bladder. The ureteral orifices were in their normal anatomic position and were effluxing clear urine. A 3 cm papillary bladder tumor was noted toward the right antero-lateral wall of the bladder. Attention then turned to the right ureteral orifice. Omnipaque contrast was injected through a 6 [place] ureteral catheter with findings as dictated above. An identical procedure was then performed on the left side which did demonstrate a fixed filling defect in the distal left ureter. A 0.38 since her guidewire was advanced up into the left renal collecting system under fluoroscopic guidance. A 6 French semirigid ureteroscope was advanced next to the wire into the ureter and up to the level of the filling defect. This revealed 2 papillary tumors which appeared low-grade on visualization. A second guidewire was then placed and an attempt was made to place a ureteral access sheath although the ureteral orifice would not allow the ureteral access sheath to be passed without resistance. This was therefore abandoned and the ureteroscope was then replaced into the ureter after the BIGopsy forceps were backloaded onto the ureteroscope. These biopsy forceps were then used to take biopsies of the ureteral tumor and this was sent for analysis. The ureteroscope was then replaced the level of the tumors and a 365 Micron holmium laser fiber was used to perform laser lithotripsy of the remaining tumor. The ureteroscope was then removed and the wire was backloaded on the cystoscope. A 6 x 24 double-J ureteral stent was then advanced over the wire using Seldinger technique and positioned appropriately under fluoroscopic and cystoscopic guidance. The wire was removed with a good curl noted in the renal pelvis as well as in the bladder. Attention then returned to the bladder.  The gyrus resectoscope was placed into  the bladder carefully so as to avoid any  injury to the male sling. Using loupe bipolar resection, the right-sided bladder tumor was resected and the tumor fragments removed. A separate cold cup biopsy was performed of the base of the tumor and sent as a separate specimen. Bipolar cautery was then used to achieve hemostasis. The bladder was emptied and reinspected and hemostasis appeared excellent. Postoperative Mitomycin C was not administered due to the thin bladder wall at the time of resection and risk of extravasation of chemotherapy as well as the indwelling stent and risk for reflux. The patient tolerated the procedure well without complications. He was able to be awakened and transferred to the recovery unit in satisfactory condition.

## 2011-10-01 NOTE — Preoperative (Signed)
Beta Blockers   Reason not to administer Beta Blockers:pt took [place] today

## 2011-10-02 DIAGNOSIS — C676 Malignant neoplasm of ureteric orifice: Secondary | ICD-10-CM | POA: Diagnosis not present

## 2011-10-02 NOTE — Discharge Planning (Deleted)
Patient ID: Jeremy Kelley MRN: [id number] DOB/AGE: [birth year] 78 y.o.  Admit date: 10/01/2011 Discharge date: 10/02/2011  Primary Care Physician:  Thomos Lemons, DO  Discharge Diagnoses:  Present on Admission:  **None**  Consults:  None    Discharge Medications: Medication List  As of 10/02/2011  7:52 AM   STOP taking these medications         aspirin 81 MG tablet         TAKE these medications         alprostadil 125 MCG pellet   Commonly known as: MUSE   1 each (125 mcg total) by Transurethral route as needed for erectile dysfunction. use no more than 3 times per week      bisoprolol 5 MG tablet   Commonly known as: ZEBETA   Take 2.5 mg by mouth at bedtime. 1/2 tablet by mouth once a day      Fish Oil 500 MG Caps   Take 500 mg by mouth 2 (two) times daily.      metroNIDAZOLE 500 MG tablet   Commonly known as: FLAGYL   Take 1 tablet (500 mg total) by mouth 3 (three) times daily.      multivitamin tablet   Take 1 tablet by mouth daily with breakfast.      pravastatin 20 MG tablet   Commonly known as: PRAVACHOL   Take 20 mg by mouth daily with breakfast.      vitamin C 500 MG tablet   Commonly known as: ASCORBIC ACID   Take 500 mg by mouth daily with breakfast.             Significant Diagnostic Studies:  Dg Chest 2 View  09/28/2011  *RADIOLOGY REPORT*  Clinical Data: Preop for bladder tumor.  CHEST - 2 VIEW  Comparison: Chest radiograph 08/10/2010  Findings: The lungs are hyperinflated.  There is stable scarring at the right lung base and stable pleuroparenchymal thickening at the right lung apex.  No airspace disease, effusion, or pneumothorax. The cardiomediastinal silhouette is enlarged and stable.  No acute bony abnormality.  IMPRESSION: Emphysema/COPD.  Stable scarring in the right lung.  No acute cardiopulmonary disease.  Original Report Authenticated By: Britta Mccreedy, M.D.    Brief H and P: For complete details please refer to admission H and P, but  in brief the patient was admitted for TURBT of bladder tumor.  Hospital Course:  The patient was admitted for anesthetic cystoscopy, TURBT, and also ended up having left ureteroscopy and ablation of a left ureteral tumor. He tolerated the procedure well. He was discharged home after voiding adequately following catheter removal on 10/02/2011.  Day of Discharge BP 143/67  Pulse 50  Temp 98.3 F (36.8 C) (Oral)  Resp 18  Ht 5\' 8"  (1.727 m)  Wt 64.864 kg (143 lb)  BMI 21.74 kg/m2  SpO2 97%  No results found for this or any previous visit (from the past 24 hour(s)).  Physical Exam: General: Alert and awake oriented x3 not in any acute distress. HEENT: anicteric sclera, pupils reactive to light and accommodation CVS: S1-S2 clear no murmur rubs or gallops Chest: clear to auscultation bilaterally, no wheezing rales or rhonchi Abdomen: soft nontender, nondistended, normal bowel sounds, no organomegaly Extremities: no cyanosis, clubbing or edema noted bilaterally Neuro: Cranial nerves II-XII intact, no focal neurological deficits  Disposition: Home  Diet: No restrictions  Activity: Gradually increase   Disposition and Follow-up: Discharge Orders    Future Appointments: Provider: Department:  Dept Phone: Center:   11/29/2011 3:30 PM Doe-Hyun Sherran Needs, DO [facility] [phone] [facility]        TESTS THAT NEED FOLLOW-UP Pathology review  DISCHARGE FOLLOW-UP Follow-up Information    Follow up with Crecencio Mc, MD. (10/19/11 at 9:45)    Contact information:   [address] [place] [facility] [place] [address] [phone]          Time spent on Discharge: 15 minutes  Signed: Chelsea Aus 10/02/2011, 7:52 AM

## 2011-10-02 NOTE — Progress Notes (Signed)
Patient instructed to call RN and to use urinal so that color of urine and amount can be documented appropriately. Pt verbalizes understanding but is non-compliant. Will continue to encourage urinal so that proper I&O can be documented.  Earnest Conroy. Clelia Croft, RN

## 2011-10-03 NOTE — Discharge Summary (Signed)
Patient ID: RHONIN TROTT MRN: [id number] DOB/AGE: 1926-05-10 78 y.o.  Admit date: 10/01/2011 Discharge date: 10/03/2011  Primary Care Physician:  Thomos Lemons, DO  Discharge Diagnoses:   Bladder tumor Ureteral tumor  Consults:  none   Discharge Medications: Medication List  As of 10/03/2011  5:16 AM   STOP taking these medications         aspirin 81 MG tablet         TAKE these medications         alprostadil 125 MCG pellet   Commonly known as: MUSE   1 each (125 mcg total) by Transurethral route as needed for erectile dysfunction. use no more than 3 times per week      bisoprolol 5 MG tablet   Commonly known as: ZEBETA   Take 2.5 mg by mouth at bedtime. 1/2 tablet by mouth once a day      Fish Oil 500 MG Caps   Take 500 mg by mouth 2 (two) times daily.      metroNIDAZOLE 500 MG tablet   Commonly known as: FLAGYL   Take 1 tablet (500 mg total) by mouth 3 (three) times daily.      multivitamin tablet   Take 1 tablet by mouth daily with breakfast.      pravastatin 20 MG tablet   Commonly known as: PRAVACHOL   Take 20 mg by mouth daily with breakfast.      vitamin C 500 MG tablet   Commonly known as: ASCORBIC ACID   Take 500 mg by mouth daily with breakfast.             Significant Diagnostic Studies:  Dg Chest 2 View  09/28/2011  *RADIOLOGY REPORT*  Clinical Data: Preop for bladder tumor.  CHEST - 2 VIEW  Comparison: Chest radiograph 08/10/2010  Findings: The lungs are hyperinflated.  There is stable scarring at the right lung base and stable pleuroparenchymal thickening at the right lung apex.  No airspace disease, effusion, or pneumothorax. The cardiomediastinal silhouette is enlarged and stable.  No acute bony abnormality.  IMPRESSION: Emphysema/COPD.  Stable scarring in the right lung.  No acute cardiopulmonary disease.  Original Report Authenticated By: Britta Mccreedy, M.D.    Brief H and P: For complete details please refer to admission H and P, but in  brief the pt is admitted for mgmt of a bladder tumor  Hospital Course:  The pt was brought to [facility] for cysto, TUR-BT and RGP. A bladder tumor was resected by Dr. Laverle Patter, and a ureteral tumor was also found and treated. He did well postop, voided after catheter removal, and was discharged home POD 1.  Day of Discharge BP 143/67  Pulse 50  Temp 98.3 F (36.8 C) (Oral)  Resp 18  Ht 5\' 8"  (1.727 m)  Wt 64.864 kg (143 lb)  BMI 21.74 kg/m2  SpO2 97%  No results found for this or any previous visit (from the past 24 hour(s)).  Physical Exam: General: Alert and awake oriented x3 not in any acute distress. HEENT: anicteric sclera, pupils reactive to light and accommodation CVS: S1-S2 clear no murmur rubs or gallops Chest: clear to auscultation bilaterally, no wheezing rales or rhonchi Abdomen: soft nontender, nondistended, normal bowel sounds, no organomegaly Extremities: no cyanosis, clubbing or edema noted bilaterally Neuro: Cranial nerves II-XII intact, no focal neurological deficits  Disposition: Home  Diet: NO restrictions  Activity: No restrictions   Disposition and Follow-up: Discharge Orders  Future Appointments: Provider: Department: Dept Phone: Center:   11/29/2011 3:30 PM Doe-Hyun Sherran Needs, DO [facility] [phone] [facility]       TESTS THAT NEED FOLLOW-UP Review of pathology  DISCHARGE FOLLOW-UP Follow-up Information    Follow up with Crecencio Mc, MD. (10/19/11 at 9:45)    Contact information:   [address] [place] [facility] [place] [address] [phone]          Time spent on Discharge: 15 minutes  Signed: Chelsea Aus 10/03/2011, 5:16 AM

## 2011-10-04 ENCOUNTER — Ambulatory Visit (INDEPENDENT_AMBULATORY_CARE_PROVIDER_SITE_OTHER): Payer: Medicare Other | Admitting: Internal Medicine

## 2011-10-04 ENCOUNTER — Encounter: Payer: Self-pay | Admitting: Internal Medicine

## 2011-10-04 VITALS — BP 116/72 | Temp 98.2°F | Wt 143.0 lb

## 2011-10-04 DIAGNOSIS — K5900 Constipation, unspecified: Secondary | ICD-10-CM

## 2011-10-04 MED ORDER — PEG 3350-KCL-NABCB-NACL-NASULF 236 G PO SOLR: 2000.0000 mL | Freq: Once | ORAL | Status: DC

## 2011-10-04 NOTE — Assessment & Plan Note (Signed)
78 year old white male with significant constipation after cystoscopy. Symptoms exacerbated by narcotic use. Patient has tried and failed over-the-counter Fleet enemas and other laxatives.  Use GoLytely as directed.  Patient advised to call office if symptoms persist or worsen.

## 2011-10-04 NOTE — Patient Instructions (Addendum)
Please call our office if your constipation symptoms do not improve or gets worse.

## 2011-10-04 NOTE — Progress Notes (Signed)
Subjective:    Patient ID: Jeremy Kelley, male    DOB: 21-Jul-1926, 78 y.o.   MRN: [id number]  HPI  78 year old white male with history of possible bladder tumor for followup. Patient underwent cystoscopy last week. He had biopsy of left ureteral tumor which showed papillary urothelial carcinoma, low grade. Patient also had biopsy of bladder, transurethral resection, right side which showed papillary urothelial carcinoma, low-grade. Third biopsy of bladder base showed inflamed and denuded urothelium.  He used pain medication over the counter and now he is complaining of significant constipation. His last bowel movement was over 5 days ago. He tried over-the-counter fleets enema without improvement.   Review of Systems Negative for fever  Past Medical History  Diagnosis Date  . History of prostate cancer 2001  . Hypertension   . Colon polyps   . Urinary incontinence     chronic urinary incontinence after prostate surgery  . Cataract   . Diverticulosis   . Phimosis   . Carotid artery stenosis 11/2008    bilateral 40-59% stenosis  . Chronic kidney disease     bladder tumor   . Hepatitis     History   Social History  . Marital Status: Single    Spouse Name: N/A    Number of Children: N/A  . Years of Education: N/A   Occupational History  . Not on file.   Social History Main Topics  . Smoking status: Passive Smoker    Types: Cigars  . Smokeless tobacco: Never Used   Comment: occasionally smokes a cigar  . Alcohol Use: Yes     3-4 glasses of wine per week-no history of excessinve alcohol use  . Drug Use: No  . Sexually Active: Not on file   Other Topics Concern  . Not on file   Social History Narrative   Patient is single, has never been married.  Does not have any children.  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  No history of excessive alcohol use.  Occasionally smokes a cigar.Spends winters  in [place]         Past Surgical History  Procedure Date  . Inguinal hernia repair   . Varicose vein surgery   . Other surgical history     s/p laparoscopic prostate surgery  . Skin cancer excision     a/p skin cancer right foot-non melanoma  . Bladder suspension 08/18/10    [name]  . Eye surgery 09/24/10    left eye. "Retina surgery"    No family history on file.  Allergies  Allergen Reactions  . Lisinopril     REACTION: dizziness    Current Outpatient Prescriptions on File Prior to Visit  Medication Sig Dispense Refill  . alprostadil (MUSE) 125 MCG pellet 1 each (125 mcg total) by Transurethral route as needed for erectile dysfunction. use no more than 3 times per week  6 each  0  . Ascorbic Acid (VITAMIN C) 500 MG tablet Take 500 mg by mouth daily with breakfast.       . bisoprolol (ZEBETA) 5 MG tablet Take 2.5 mg by mouth at bedtime. 1/2 tablet by mouth once a day      . metroNIDAZOLE (FLAGYL) 500 MG tablet Take 1 tablet (500 mg total) by mouth 3 (three) times daily.  30 tablet  0  . Multiple Vitamin (MULTIVITAMIN) tablet Take 1 tablet by mouth daily with breakfast.       .  Omega-3 Fatty Acids (FISH OIL) 500 MG CAPS Take 500 mg by mouth 2 (two) times daily.       . pravastatin (PRAVACHOL) 20 MG tablet Take 20 mg by mouth daily with breakfast.         BP 116/72  Temp 98.2 F (36.8 C) (Oral)  Wt 143 lb (64.864 kg)       Objective:   Physical Exam  Constitutional: He is oriented to person, place, and time. He appears well-developed and well-nourished.  Cardiovascular: Normal rate, regular rhythm and normal heart sounds.   Pulmonary/Chest: Effort normal and breath sounds normal.  Abdominal: Soft. Bowel sounds are normal. He exhibits no mass.       Mild LLQ tenderness  Neurological: He is alert and oriented to person, place, and time.  Psychiatric: He has a normal mood and affect. His behavior is normal.          Assessment & Plan:

## 2011-10-05 ENCOUNTER — Ambulatory Visit (INDEPENDENT_AMBULATORY_CARE_PROVIDER_SITE_OTHER): Payer: Medicare Other | Admitting: Internal Medicine

## 2011-10-05 ENCOUNTER — Encounter (HOSPITAL_COMMUNITY): Payer: Self-pay | Admitting: Urology

## 2011-10-05 VITALS — BP 112/72 | Wt 143.0 lb

## 2011-10-05 DIAGNOSIS — K5900 Constipation, unspecified: Secondary | ICD-10-CM

## 2011-10-05 MED ORDER — POLYETHYLENE GLYCOL 3350 17 GM/SCOOP PO POWD: 17.0000 g | Freq: Two times a day (BID) | ORAL | Status: DC | PRN

## 2011-10-05 NOTE — Patient Instructions (Addendum)
Use psyllium fiber laxative once daily Please call our office if your symptoms do not improve or gets worse.

## 2011-10-05 NOTE — Progress Notes (Signed)
Subjective:    Patient ID: Jeremy Kelley, male    DOB: 06-Dec-1926, 78 y.o.   MRN: [id number]  HPI  78 year old white male for followup regarding constipation. Patient was concerned they did not have a bowel movement until 12 PM today. He used to GoLYTELY as directed. His stools were normal but somewhat dark in color.  He denies abdominal pain.  He has poor fiber intake in his diet.  Review of Systems Negative for fever.  Mild LLQ pain  Past Medical History  Diagnosis Date  . History of prostate cancer 2001  . Hypertension   . Colon polyps   . Urinary incontinence     chronic urinary incontinence after prostate surgery  . Cataract   . Diverticulosis   . Phimosis   . Carotid artery stenosis 11/2008    bilateral 40-59% stenosis  . Chronic kidney disease     bladder tumor   . Hepatitis     History   Social History  . Marital Status: Single    Spouse Name: N/A    Number of Children: N/A  . Years of Education: N/A   Occupational History  . Not on file.   Social History Main Topics  . Smoking status: Passive Smoker    Types: Cigars  . Smokeless tobacco: Never Used   Comment: occasionally smokes a cigar  . Alcohol Use: Yes     3-4 glasses of wine per week-no history of excessinve alcohol use  . Drug Use: No  . Sexually Active: Not on file   Other Topics Concern  . Not on file   Social History Narrative   Patient is single, has never been married.  Does not have any children.  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  No history of excessive alcohol use.  Occasionally smokes a cigar.Spends winters in [place]         Past Surgical History  Procedure Date  . Inguinal hernia repair   . Varicose vein surgery   . Other surgical history     s/p laparoscopic prostate surgery  . Skin cancer excision     a/p skin cancer right foot-non melanoma  . Bladder suspension 08/18/10    [name]  .  Eye surgery 09/24/10    left eye. "Retina surgery"  . Transurethral resection of bladder tumor 10/01/2011    Procedure: TRANSURETHRAL RESECTION OF BLADDER TUMOR (TURBT);  Surgeon: Crecencio Mc, MD;  Location: [facility];  Service: Urology;  Laterality: N/A;  . Esophageal biopsy 10/01/2011    Procedure: BIOPSY;  Surgeon: Crecencio Mc, MD;  Location: [facility];  Service: Urology;;  biopsy of bladder tumor    No family history on file.  Allergies  Allergen Reactions  . Lisinopril     REACTION: dizziness    Current Outpatient Prescriptions on File Prior to Visit  Medication Sig Dispense Refill  . Ascorbic Acid (VITAMIN C) 500 MG tablet Take 500 mg by mouth daily with breakfast.       . bisoprolol (ZEBETA) 5 MG tablet Take 2.5 mg by mouth at bedtime. 1/2 tablet by mouth once a day      . ciprofloxacin (CIPRO) 500 MG tablet Take 500 mg by mouth 2 (two) times daily.      . metroNIDAZOLE (FLAGYL) 500 MG tablet Take 1 tablet (500 mg total) by mouth 3 (three) times daily.  30 tablet  0  . Multiple Vitamin (MULTIVITAMIN) tablet  Take 1 tablet by mouth daily with breakfast.       . Omega-3 Fatty Acids (FISH OIL) 500 MG CAPS Take 500 mg by mouth 2 (two) times daily.       . pravastatin (PRAVACHOL) 20 MG tablet Take 20 mg by mouth daily with breakfast.       . alprostadil (MUSE) 125 MCG pellet 1 each (125 mcg total) by Transurethral route as needed for erectile dysfunction. use no more than 3 times per week  6 each  0    BP 112/72  Wt 143 lb (64.864 kg)       Objective:   Physical Exam  Constitutional: He appears well-developed and well-nourished.  Cardiovascular: Regular rhythm and normal heart sounds.   Pulmonary/Chest: Effort normal and breath sounds normal.  Abdominal: Soft. Bowel sounds are normal. He exhibits no mass.       Mild LLQ tenderness  Musculoskeletal: He exhibits no edema.          Assessment & Plan:

## 2011-10-05 NOTE — Assessment & Plan Note (Signed)
Improved with GoLYTELY.  Maintain patient on MiraLax once or twice a day along with psyllium fiber bulk laxative.  Patient advised to drink 6 glasses of water per day.

## 2011-10-08 ENCOUNTER — Ambulatory Visit (INDEPENDENT_AMBULATORY_CARE_PROVIDER_SITE_OTHER): Payer: Medicare Other | Admitting: Internal Medicine

## 2011-10-08 ENCOUNTER — Encounter: Payer: Self-pay | Admitting: Internal Medicine

## 2011-10-08 VITALS — BP 156/78 | Temp 98.4°F | Wt 139.0 lb

## 2011-10-08 DIAGNOSIS — R197 Diarrhea, unspecified: Secondary | ICD-10-CM

## 2011-10-08 MED ORDER — COLESEVELAM HCL 625 MG PO TABS: 625.0000 mg | ORAL_TABLET | Freq: Three times a day (TID) | ORAL | Status: DC | PRN

## 2011-10-08 MED ORDER — METRONIDAZOLE 500 MG PO TABS: 500.0000 mg | ORAL_TABLET | Freq: Three times a day (TID) | ORAL | Status: DC

## 2011-10-08 NOTE — Patient Instructions (Addendum)
Please increase your fluid intake as directed Call our office if your diarrhea gets worse.

## 2011-10-08 NOTE — Assessment & Plan Note (Signed)
78 year old white male seen for constipation now has diarrhea. He is recently treated with Cipro after his cystoscopy. He has high risk for C. difficile colitis. Check stools for C. difficile by PCR. Use metronidazole 500 mg 3 times a day.  Samples of WelChol 625 mg to use 3 times a day as needed provided.

## 2011-10-08 NOTE — Progress Notes (Signed)
Subjective:    Patient ID: Jeremy Kelley, male    DOB: [birth year], 78 y.o.   MRN: [id number]  HPI  78 year old previously seen for constipation now complains of diarrhea. He underwent cystoscopy for bladder tumor biopsy last week. He took several doses of pain medication over the weekend and hadn't had a bowel movement for 5 days. No improvement with over-the-counter laxatives, so patient was treated with half dose GoLYTELY. On Wednesday of this week patient had normal bowel movement at noon. Subsequently, patient reports having 3 or 4 watery bowel movements per day. He was started on Cipro after cystoscopy.  Review of Systems Mild fatigue  Past Medical History  Diagnosis Date  . History of prostate cancer 2001  . Hypertension   . Colon polyps   . Urinary incontinence     chronic urinary incontinence after prostate surgery  . Cataract   . Diverticulosis   . Phimosis   . Carotid artery stenosis 11/2008    bilateral 40-59% stenosis  . Chronic kidney disease     bladder tumor   . Hepatitis     History   Social History  . Marital Status: Single    Spouse Name: N/A    Number of Children: N/A  . Years of Education: N/A   Occupational History  . Not on file.   Social History Main Topics  . Smoking status: Passive Smoker    Types: Cigars  . Smokeless tobacco: Never Used   Comment: occasionally smokes a cigar  . Alcohol Use: Yes     3-4 glasses of wine per week-no history of excessinve alcohol use  . Drug Use: No  . Sexually Active: Not on file   Other Topics Concern  . Not on file   Social History Narrative   Patient is single, has never been married.  Does not have any children.  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  No history of excessive alcohol use.  Occasionally smokes a cigar.Spends winters in [place]         Past Surgical History  Procedure Date  . Inguinal hernia repair   .  Varicose vein surgery   . Other surgical history     s/p laparoscopic prostate surgery  . Skin cancer excision     a/p skin cancer right foot-non melanoma  . Bladder suspension 08/18/10    [name]  . Eye surgery 09/24/10    left eye. "Retina surgery"  . Transurethral resection of bladder tumor 10/01/2011    Procedure: TRANSURETHRAL RESECTION OF BLADDER TUMOR (TURBT);  Surgeon: Crecencio Mc, MD;  Location: [facility];  Service: Urology;  Laterality: N/A;  . Esophageal biopsy 10/01/2011    Procedure: BIOPSY;  Surgeon: Crecencio Mc, MD;  Location: [facility];  Service: Urology;;  biopsy of bladder tumor    No family history on file.  Allergies  Allergen Reactions  . Lisinopril     REACTION: dizziness    Current Outpatient Prescriptions on File Prior to Visit  Medication Sig Dispense Refill  . Ascorbic Acid (VITAMIN C) 500 MG tablet Take 500 mg by mouth daily with breakfast.       . bisoprolol (ZEBETA) 5 MG tablet Take 2.5 mg by mouth at bedtime. 1/2 tablet by mouth once a day      . ciprofloxacin (CIPRO) 500 MG tablet Take 500 mg by mouth 2 (two) times daily.      . Multiple Vitamin (  MULTIVITAMIN) tablet Take 1 tablet by mouth daily with breakfast.       . Omega-3 Fatty Acids (FISH OIL) 500 MG CAPS Take 500 mg by mouth 2 (two) times daily.       . pravastatin (PRAVACHOL) 20 MG tablet Take 20 mg by mouth daily with breakfast.       . alprostadil (MUSE) 125 MCG pellet 1 each (125 mcg total) by Transurethral route as needed for erectile dysfunction. use no more than 3 times per week  6 each  0  . colesevelam (WELCHOL) 625 MG tablet Take 1 tablet (625 mg total) by mouth 3 (three) times daily as needed.  30 tablet  0    BP 156/78  Temp 98.4 F (36.9 C) (Oral)  Wt 139 lb (63.05 kg)       Objective:   Physical Exam  Constitutional: He is oriented to person, place, and time. He appears well-developed and well-nourished.  Cardiovascular: Normal rate, regular rhythm and normal heart sounds.     Pulmonary/Chest: Effort normal. He has no wheezes.  Abdominal: Soft. Bowel sounds are normal.       Mild LLQ tenderness  Neurological: He is alert and oriented to person, place, and time.  Skin: Skin is warm and dry.      Assessment & Plan:

## 2011-10-19 ENCOUNTER — Ambulatory Visit (INDEPENDENT_AMBULATORY_CARE_PROVIDER_SITE_OTHER): Payer: Medicare Other | Admitting: Internal Medicine

## 2011-10-19 VITALS — BP 120/62 | HR 60 | Temp 98.7°F | Resp 16 | Ht 68.5 in | Wt 141.0 lb

## 2011-10-19 DIAGNOSIS — C679 Malignant neoplasm of bladder, unspecified: Secondary | ICD-10-CM

## 2011-10-19 DIAGNOSIS — N390 Urinary tract infection, site not specified: Secondary | ICD-10-CM | POA: Diagnosis not present

## 2011-10-19 DIAGNOSIS — R197 Diarrhea, unspecified: Secondary | ICD-10-CM | POA: Diagnosis not present

## 2011-10-19 HISTORY — DX: Malignant neoplasm of bladder, unspecified: C67.9

## 2011-10-19 MED ORDER — SACCHAROMYCES BOULARDII 250 MG PO CAPS: 250.0000 mg | ORAL_CAPSULE | Freq: Two times a day (BID) | ORAL | Status: AC

## 2011-10-19 NOTE — Assessment & Plan Note (Signed)
Stools negative for C Diff.  He is experiencing formed stools 3-4 times per day.  Continue low dose Welchol.  Start probiotic.  He has been recently started on Bactrim DS after stent removal.

## 2011-10-19 NOTE — Assessment & Plan Note (Addendum)
Followed by urology (Dr. Laverle Patter).  Repeat cystoscopy planned for Oct, 2013

## 2011-10-19 NOTE — Progress Notes (Signed)
Subjective:    Patient ID: Jeremy Kelley, male    DOB: 1926-05-16, 78 y.o.   MRN: [id number]  HPI  78 y/o white male for follow up.  Patient seen by urologist.  Repeat cystoscopy planned in October. Ureteral stent removed.  Patient started on Bactrim DS for abnormal U/A.  Patient diarrhea improved.  He is having formed stools 3 - 4 times per day.  Stool studies were negative for C Diff.  He is using welchol once daily.  Review of Systems Negative for fever or chills  Past Medical History  Diagnosis Date  . History of prostate cancer 2001  . Hypertension   . Colon polyps   . Urinary incontinence     chronic urinary incontinence after prostate surgery  . Cataract   . Diverticulosis   . Phimosis   . Carotid artery stenosis 11/2008    bilateral 40-59% stenosis  . Chronic kidney disease     bladder tumor   . Hepatitis     History   Social History  . Marital Status: Single    Spouse Name: N/A    Number of Children: N/A  . Years of Education: N/A   Occupational History  . Not on file.   Social History Main Topics  . Smoking status: Passive Smoker    Types: Cigars  . Smokeless tobacco: Never Used   Comment: occasionally smokes a cigar  . Alcohol Use: Yes     3-4 glasses of wine per week-no history of excessinve alcohol use  . Drug Use: No  . Sexually Active: Not on file   Other Topics Concern  . Not on file   Social History Narrative   Patient is single, has never been married.  Does not have any children.  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  No history of excessive alcohol use.  Occasionally smokes a cigar.Spends winters in [place]         Past Surgical History  Procedure Date  . Inguinal hernia repair   . Varicose vein surgery   . Other surgical history     s/p laparoscopic prostate surgery  . Skin cancer excision     a/p skin cancer right foot-non melanoma  . Bladder  suspension 08/18/10    [name]  . Eye surgery 09/24/10    left eye. "Retina surgery"  . Transurethral resection of bladder tumor 10/01/2011    Procedure: TRANSURETHRAL RESECTION OF BLADDER TUMOR (TURBT);  Surgeon: Crecencio Mc, MD;  Location: [facility];  Service: Urology;  Laterality: N/A;  . Esophageal biopsy 10/01/2011    Procedure: BIOPSY;  Surgeon: Crecencio Mc, MD;  Location: [facility];  Service: Urology;;  biopsy of bladder tumor    No family history on file.  Allergies  Allergen Reactions  . Lisinopril     REACTION: dizziness    Current Outpatient Prescriptions on File Prior to Visit  Medication Sig Dispense Refill  . alprostadil (MUSE) 125 MCG pellet 1 each (125 mcg total) by Transurethral route as needed for erectile dysfunction. use no more than 3 times per week  6 each  0  . Ascorbic Acid (VITAMIN C) 500 MG tablet Take 500 mg by mouth daily with breakfast.       . bisoprolol (ZEBETA) 5 MG tablet Take 2.5 mg by mouth at bedtime. 1/2 tablet by mouth once a day      . colesevelam (WELCHOL) 625 MG tablet Take  1 tablet (625 mg total) by mouth 3 (three) times daily as needed.  30 tablet  0  . Multiple Vitamin (MULTIVITAMIN) tablet Take 1 tablet by mouth daily with breakfast.       . Omega-3 Fatty Acids (FISH OIL) 500 MG CAPS Take 500 mg by mouth 2 (two) times daily.       . pravastatin (PRAVACHOL) 20 MG tablet Take 20 mg by mouth daily with breakfast.         BP 120/62  Pulse 60  Temp 98.7 F (37.1 C)  Resp 16  Ht 5' 8.5" (1.74 m)  Wt 141 lb (63.957 kg)  BMI 21.13 kg/m2       Objective:   Physical Exam  Constitutional: He appears well-developed and well-nourished.  Cardiovascular: Normal rate, regular rhythm and normal heart sounds.   Pulmonary/Chest: Effort normal and breath sounds normal. He has no wheezes.  Abdominal: Soft. Bowel sounds are normal. There is no rebound and no guarding.       Mild lower abdominal tenderness  Skin: Skin is warm and dry.  Psychiatric: He  has a normal mood and affect. His behavior is normal.          Assessment & Plan:

## 2011-11-05 ENCOUNTER — Ambulatory Visit (INDEPENDENT_AMBULATORY_CARE_PROVIDER_SITE_OTHER): Payer: Medicare Other | Admitting: Internal Medicine

## 2011-11-05 ENCOUNTER — Encounter: Payer: Self-pay | Admitting: Internal Medicine

## 2011-11-05 VITALS — BP 134/76 | HR 56 | Temp 98.1°F | Wt 137.0 lb

## 2011-11-05 DIAGNOSIS — E785 Hyperlipidemia, unspecified: Secondary | ICD-10-CM | POA: Diagnosis not present

## 2011-11-05 DIAGNOSIS — R197 Diarrhea, unspecified: Secondary | ICD-10-CM

## 2011-11-05 DIAGNOSIS — I10 Essential (primary) hypertension: Secondary | ICD-10-CM

## 2011-11-05 NOTE — Assessment & Plan Note (Signed)
He is tolerating pravastatin without difficulty. Obtain LFTs and fasting lipid profile at next office visit.

## 2011-11-05 NOTE — Progress Notes (Signed)
Subjective:    Patient ID: Jeremy Kelley, male    DOB: 04/29/1926, 78 y.o.   MRN: [id number]  HPI  78 year old white male with history of prostate cancer, bladder and ureteral cancer, and hypertension for follow up.  Patient reports since using probiotic his loose stools have completely resolved. His bowel movements are regular.  Hypertension-well-controlled. He has occasional palpitations. No associated dizziness.  He is scheduled for repeat cystoscopy on 01/26/2012.  Review of Systems Negative for chest pain and shortness of breath  Past Medical History  Diagnosis Date  . History of prostate cancer 2001  . Hypertension   . Colon polyps   . Urinary incontinence     chronic urinary incontinence after prostate surgery  . Cataract   . Diverticulosis   . Phimosis   . Carotid artery stenosis 11/2008    bilateral 40-59% stenosis  . Chronic kidney disease     bladder tumor   . Hepatitis     History   Social History  . Marital Status: Single    Spouse Name: N/A    Number of Children: N/A  . Years of Education: N/A   Occupational History  . Not on file.   Social History Main Topics  . Smoking status: Passive Smoker    Types: Cigars  . Smokeless tobacco: Never Used   Comment: occasionally smokes a cigar  . Alcohol Use: Yes     3-4 glasses of wine per week-no history of excessinve alcohol use  . Drug Use: No  . Sexually Active: Not on file   Other Topics Concern  . Not on file   Social History Narrative   Patient is single, has never been married.  Does not have any children.  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  No history of excessive alcohol use.  Occasionally smokes a cigar.Spends winters in [place]         Past Surgical History  Procedure Date  . Inguinal hernia repair   . Varicose vein surgery   . Other surgical history     s/p laparoscopic prostate surgery  . Skin cancer  excision     a/p skin cancer right foot-non melanoma  . Bladder suspension 08/18/10    [name]  . Eye surgery 09/24/10    left eye. "Retina surgery"  . Transurethral resection of bladder tumor 10/01/2011    Procedure: TRANSURETHRAL RESECTION OF BLADDER TUMOR (TURBT);  Surgeon: Crecencio Mc, MD;  Location: [facility];  Service: Urology;  Laterality: N/A;  . Esophageal biopsy 10/01/2011    Procedure: BIOPSY;  Surgeon: Crecencio Mc, MD;  Location: [facility];  Service: Urology;;  biopsy of bladder tumor    No family history on file.  Allergies  Allergen Reactions  . Lisinopril     REACTION: dizziness    Current Outpatient Prescriptions on File Prior to Visit  Medication Sig Dispense Refill  . alprostadil (MUSE) 125 MCG pellet 1 each (125 mcg total) by Transurethral route as needed for erectile dysfunction. use no more than 3 times per week  6 each  0  . Multiple Vitamin (MULTIVITAMIN) tablet Take 1 tablet by mouth daily with breakfast.       . pravastatin (PRAVACHOL) 20 MG tablet Take 20 mg by mouth daily with breakfast.       . sulfamethoxazole-trimethoprim (BACTRIM DS) 800-160 MG per tablet Take 1 tablet by mouth 2 (two) times daily.  20 tablet  0    BP 134/76  Pulse 56  Temp 98.1 F (36.7 C) (Oral)  Wt 137 lb (62.143 kg)       Objective:   Physical Exam  Constitutional: He is oriented to person, place, and time. He appears well-developed and well-nourished.  Cardiovascular: Normal rate, regular rhythm and normal heart sounds.        Occasional ectopy  Pulmonary/Chest: Effort normal and breath sounds normal. He has no wheezes.  Musculoskeletal: He exhibits no edema.  Neurological: He is alert and oriented to person, place, and time.  Skin: Skin is warm and dry.        Assessment & Plan:

## 2011-11-05 NOTE — Assessment & Plan Note (Signed)
Resolved.  Patient advised he can continue using over-the-counter probiotics. Patient also advised to use psyllium fiber supplement daily.

## 2011-11-05 NOTE — Assessment & Plan Note (Signed)
Well-controlled on low-dose bisoprolol 25 mg once daily. He has occasional premature beats. I suggest repeating EKG before his next cystoscopy in October.

## 2011-11-16 ENCOUNTER — Other Ambulatory Visit: Payer: Self-pay | Admitting: Internal Medicine

## 2011-11-16 ENCOUNTER — Other Ambulatory Visit: Payer: Self-pay

## 2011-11-16 MED ORDER — BISOPROLOL FUMARATE 5 MG PO TABS: 2.5000 mg | ORAL_TABLET | Freq: Every day | ORAL | Status: DC

## 2011-11-16 MED ORDER — PRAVASTATIN SODIUM 20 MG PO TABS: 20.0000 mg | ORAL_TABLET | Freq: Every day | ORAL | Status: DC

## 2011-11-16 NOTE — Telephone Encounter (Signed)
Rx sent to pharmacy   

## 2011-11-29 ENCOUNTER — Ambulatory Visit: Payer: Medicare Other | Admitting: Internal Medicine

## 2011-11-30 ENCOUNTER — Ambulatory Visit (INDEPENDENT_AMBULATORY_CARE_PROVIDER_SITE_OTHER): Payer: Medicare Other | Admitting: Internal Medicine

## 2011-11-30 VITALS — BP 156/78 | HR 60 | Temp 98.1°F | Resp 16 | Ht 68.5 in | Wt 140.0 lb

## 2011-11-30 DIAGNOSIS — I8393 Asymptomatic varicose veins of bilateral lower extremities: Secondary | ICD-10-CM

## 2011-11-30 DIAGNOSIS — I8390 Asymptomatic varicose veins of unspecified lower extremity: Secondary | ICD-10-CM | POA: Diagnosis not present

## 2011-11-30 MED ORDER — PRAVASTATIN SODIUM 20 MG PO TABS: 20.0000 mg | ORAL_TABLET | Freq: Every day | ORAL | Status: DC

## 2011-11-30 MED ORDER — TRIAMCINOLONE ACETONIDE 0.1 % EX CREA: TOPICAL_CREAM | Freq: Two times a day (BID) | CUTANEOUS | Status: DC

## 2011-11-30 MED ORDER — BISOPROLOL FUMARATE 5 MG PO TABS: 2.5000 mg | ORAL_TABLET | Freq: Every day | ORAL | Status: DC

## 2011-11-30 NOTE — Patient Instructions (Addendum)
Our office will contact you re: referral to vascular specialist

## 2011-11-30 NOTE — Assessment & Plan Note (Signed)
Patient with progressive worsening of lower extremity varicosities. He is experiencing stasis dermatitis. Treat with triamcinolone cream as needed. Refer to vascular specialist. Use compression hose for now.

## 2011-11-30 NOTE — Progress Notes (Signed)
Subjective:    Patient ID: Jeremy Kelley, male    DOB: Apr 07, 1927, 78 y.o.   MRN: [id number]  HPI  78 year old white male with history of prostate cancer, hypertension, and hyperlipidemia complains of varicose veins. He has had varicose veins for years but now is experiencing stasis dermatitis. He has few scaly areas. He denies calf pain with ambulation.  Htn - stable.  He checks home blood pressure readings. They are usually in the 120 systolic and 80s diastolic.  Review of Systems See HPI  Past Medical History  Diagnosis Date  . History of prostate cancer 2001  . Hypertension   . Colon polyps   . Urinary incontinence     chronic urinary incontinence after prostate surgery  . Cataract   . Diverticulosis   . Phimosis   . Carotid artery stenosis 11/2008    bilateral 40-59% stenosis  . Chronic kidney disease     bladder tumor   . Hepatitis     History   Social History  . Marital Status: Single    Spouse Name: N/A    Number of Children: N/A  . Years of Education: N/A   Occupational History  . Not on file.   Social History Main Topics  . Smoking status: Passive Smoker    Types: Cigars  . Smokeless tobacco: Never Used   Comment: occasionally smokes a cigar  . Alcohol Use: Yes     3-4 glasses of wine per week-no history of excessinve alcohol use  . Drug Use: No  . Sexually Active: Not on file   Other Topics Concern  . Not on file   Social History Narrative   Patient is single, has never been married.  Does not have any children.  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  No history of excessive alcohol use.  Occasionally smokes a cigar.Spends winters in [place]         Past Surgical History  Procedure Date  . Inguinal hernia repair   . Varicose vein surgery   . Other surgical history     s/p laparoscopic prostate surgery  . Skin cancer excision     a/p skin cancer right foot-non  melanoma  . Bladder suspension 08/18/10    [name]  . Eye surgery 09/24/10    left eye. "Retina surgery"  . Transurethral resection of bladder tumor 10/01/2011    Procedure: TRANSURETHRAL RESECTION OF BLADDER TUMOR (TURBT);  Surgeon: Crecencio Mc, MD;  Location: [facility];  Service: Urology;  Laterality: N/A;  . Esophageal biopsy 10/01/2011    Procedure: BIOPSY;  Surgeon: Crecencio Mc, MD;  Location: [facility];  Service: Urology;;  biopsy of bladder tumor    No family history on file.  Allergies  Allergen Reactions  . Lisinopril     REACTION: dizziness    Current Outpatient Prescriptions on File Prior to Visit  Medication Sig Dispense Refill  . alprostadil (MUSE) 125 MCG pellet 1 each (125 mcg total) by Transurethral route as needed for erectile dysfunction. use no more than 3 times per week  6 each  0  . Multiple Vitamin (MULTIVITAMIN) tablet Take 1 tablet by mouth daily with breakfast.       . sulfamethoxazole-trimethoprim (BACTRIM DS) 800-160 MG per tablet Take 1 tablet by mouth 2 (two) times daily.  20 tablet  0  . DISCONTD: pravastatin (PRAVACHOL) 20 MG tablet TAKE 1 TABLET (20 MG TOTAL) BY MOUTH DAILY.  150 tablet  0  . DISCONTD: pravastatin (PRAVACHOL) 20 MG tablet Take 1 tablet (20 mg total) by mouth daily with breakfast.  150 tablet  0    BP 156/78  Pulse 60  Temp 98.1 F (36.7 C)  Resp 16  Ht 5' 8.5" (1.74 m)  Wt 140 lb (63.504 kg)  BMI 20.98 kg/m2       Objective:   Physical Exam  Constitutional: He appears well-nourished.       Thin, pleasant 78 year old male  Cardiovascular: Normal rate, regular rhythm and normal heart sounds.   Pulmonary/Chest: Effort normal and breath sounds normal. He has no wheezes.  Skin:       Multiple lower extremity varicosities Stasis dermatitis bilaterally, no ulcerations  Psychiatric: He has a normal mood and affect. His behavior is normal.       Assessment & Plan:

## 2011-12-02 ENCOUNTER — Other Ambulatory Visit: Payer: Self-pay

## 2011-12-02 DIAGNOSIS — I83893 Varicose veins of bilateral lower extremities with other complications: Secondary | ICD-10-CM

## 2011-12-31 ENCOUNTER — Encounter: Payer: Medicare Other | Admitting: Vascular Surgery

## 2012-01-13 ENCOUNTER — Other Ambulatory Visit: Payer: Self-pay | Admitting: Urology

## 2012-01-24 ENCOUNTER — Ambulatory Visit (INDEPENDENT_AMBULATORY_CARE_PROVIDER_SITE_OTHER): Payer: Medicare Other | Admitting: Internal Medicine

## 2012-01-24 ENCOUNTER — Encounter (HOSPITAL_COMMUNITY): Payer: Self-pay | Admitting: *Deleted

## 2012-01-24 ENCOUNTER — Encounter: Payer: Self-pay | Admitting: Internal Medicine

## 2012-01-24 VITALS — BP 140/70 | HR 54 | Temp 97.7°F | Wt 142.0 lb

## 2012-01-24 DIAGNOSIS — I10 Essential (primary) hypertension: Secondary | ICD-10-CM

## 2012-01-24 DIAGNOSIS — I8393 Asymptomatic varicose veins of bilateral lower extremities: Secondary | ICD-10-CM

## 2012-01-24 DIAGNOSIS — I8390 Asymptomatic varicose veins of unspecified lower extremity: Secondary | ICD-10-CM | POA: Diagnosis not present

## 2012-01-24 MED ORDER — BISOPROLOL FUMARATE 5 MG PO TABS: 2.5000 mg | ORAL_TABLET | Freq: Every day | ORAL | Status: DC

## 2012-01-24 MED ORDER — LOSARTAN POTASSIUM 25 MG PO TABS: 25.0000 mg | ORAL_TABLET | Freq: Every day | ORAL | Status: DC

## 2012-01-24 MED ORDER — PRAVASTATIN SODIUM 20 MG PO TABS: 20.0000 mg | ORAL_TABLET | Freq: Every day | ORAL | Status: DC

## 2012-01-24 NOTE — Assessment & Plan Note (Signed)
Patient will need to reschedule with vascular specialist when he returns from [place] in spring of 2014

## 2012-01-24 NOTE — Assessment & Plan Note (Signed)
Patient reports systolic blood pressure readings are trending upward. Patient advised to continue to monitor at home. If systolic blood pressure readings are consistently greater than 150s to 160s, patient will add losartan 25 mg once daily.

## 2012-01-24 NOTE — Progress Notes (Signed)
Subjective:    Patient ID: Jeremy Kelley, male    DOB: 09/04/1926, 78 y.o.   MRN: [id number]  HPI  78 year old white male with history of prostate cancer, bladder cancer, hypertension And hyperlipidemia for followup. Patient has returned from his trip to [place]. He has followup cystoscopy with his urologist-Dr. Laverle Patter.  He was unable to keep his appointment with vascular specialist due to scheduling conflicts. His stasis dermatitis has not changed. He denies any chronic fatigue or pain in his lower extremities.  Hypertension-patient reports sporadic elevated systolic blood pressure readings at home. He is taking bisoprolol 2.5 mg once daily.  Review of Systems Negative for chest pain or shortness of breath.  Past Medical History  Diagnosis Date  . History of prostate cancer 2001  . Hypertension   . Colon polyps   . Urinary incontinence     chronic urinary incontinence after prostate surgery  . Cataract   . Diverticulosis   . Phimosis   . Carotid artery stenosis 11/2008    bilateral 40-59% stenosis  . Chronic kidney disease     bladder tumor   . Hepatitis     History   Social History  . Marital Status: Single    Spouse Name: N/A    Number of Children: N/A  . Years of Education: N/A   Occupational History  . Not on file.   Social History Main Topics  . Smoking status: Passive Smoke Exposure - Never Smoker    Types: Cigars  . Smokeless tobacco: Never Used   Comment: occasionally smokes a cigar  . Alcohol Use: Yes     3-4 glasses of wine per week-no history of excessinve alcohol use  . Drug Use: No  . Sexually Active: Not on file   Other Topics Concern  . Not on file   Social History Narrative   Patient is single, has never been married.  Does not have any children.  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  No history of excessive alcohol use.  Occasionally smokes a cigar.Spends winters in  [place]         Past Surgical History  Procedure Date  . Inguinal hernia repair   . Varicose vein surgery   . Other surgical history     s/p laparoscopic prostate surgery  . Skin cancer excision     a/p skin cancer right foot-non melanoma  . Bladder suspension 08/18/10    [name]  . Eye surgery 09/24/10    left eye. "Retina surgery"  . Transurethral resection of bladder tumor 10/01/2011    Procedure: TRANSURETHRAL RESECTION OF BLADDER TUMOR (TURBT);  Surgeon: Crecencio Mc, MD;  Location: [facility];  Service: Urology;  Laterality: N/A;  . Esophageal biopsy 10/01/2011    Procedure: BIOPSY;  Surgeon: Crecencio Mc, MD;  Location: [facility];  Service: Urology;;  biopsy of bladder tumor    No family history on file.  Allergies  Allergen Reactions  . Lisinopril     REACTION: dizziness    Current Outpatient Prescriptions on File Prior to Visit  Medication Sig Dispense Refill  . alprostadil (MUSE) 125 MCG pellet 1 each (125 mcg total) by Transurethral route as needed for erectile dysfunction. use no more than 3 times per week  6 each  0  . losartan (COZAAR) 25 MG tablet Take 1 tablet (25 mg total) by mouth daily.  180 tablet  1  . Multiple Vitamin (MULTIVITAMIN) tablet  Take 1 tablet by mouth daily with breakfast.       . sulfamethoxazole-trimethoprim (BACTRIM DS) 800-160 MG per tablet Take 1 tablet by mouth 2 (two) times daily.  20 tablet  0  . triamcinolone cream (KENALOG) 0.1 % Apply topically 2 (two) times daily.  454 g  0  . DISCONTD: pravastatin (PRAVACHOL) 20 MG tablet Take 1 tablet (20 mg total) by mouth daily.  150 tablet  0    BP 140/70  Pulse 54  Temp 97.7 F (36.5 C) (Oral)  Wt 142 lb (64.411 kg)       Objective:   Physical Exam  Constitutional: He appears well-developed and well-nourished.  Cardiovascular: Normal rate, regular rhythm and normal heart sounds.   No murmur heard. Pulmonary/Chest: Effort normal and breath sounds normal. He has no wheezes.    Musculoskeletal: He exhibits no edema.  Skin:       Bilateral varicose veins and stasis dermatitis  Psychiatric: He has a normal mood and affect. His behavior is normal.          Assessment & Plan:

## 2012-01-24 NOTE — Patient Instructions (Signed)
Start losartan as directed if your systolic blood pressures are consistently greater than 150. BMET - 401.9

## 2012-01-25 ENCOUNTER — Encounter (HOSPITAL_COMMUNITY): Payer: Self-pay | Admitting: Pharmacy Technician

## 2012-01-25 ENCOUNTER — Encounter (HOSPITAL_COMMUNITY): Payer: Self-pay

## 2012-01-25 ENCOUNTER — Encounter (HOSPITAL_COMMUNITY)
Admission: RE | Admit: 2012-01-25 | Discharge: 2012-01-25 | Disposition: A | Discharge status: Home / Self Care | Payer: Medicare Other | Source: Ambulatory Visit | Attending: Urology | Admitting: Urology

## 2012-01-25 ENCOUNTER — Encounter: Payer: Medicare Other | Admitting: Vascular Surgery

## 2012-01-25 DIAGNOSIS — C669 Malignant neoplasm of unspecified ureter: Secondary | ICD-10-CM | POA: Diagnosis not present

## 2012-01-25 DIAGNOSIS — Z79899 Other long term (current) drug therapy: Secondary | ICD-10-CM | POA: Diagnosis not present

## 2012-01-25 DIAGNOSIS — Z01812 Encounter for preprocedural laboratory examination: Secondary | ICD-10-CM | POA: Diagnosis not present

## 2012-01-25 DIAGNOSIS — N476 Balanoposthitis: Secondary | ICD-10-CM | POA: Diagnosis not present

## 2012-01-25 DIAGNOSIS — N3946 Mixed incontinence: Secondary | ICD-10-CM | POA: Diagnosis not present

## 2012-01-25 DIAGNOSIS — C679 Malignant neoplasm of bladder, unspecified: Secondary | ICD-10-CM | POA: Diagnosis not present

## 2012-01-25 DIAGNOSIS — Z7982 Long term (current) use of aspirin: Secondary | ICD-10-CM | POA: Diagnosis not present

## 2012-01-25 DIAGNOSIS — N529 Male erectile dysfunction, unspecified: Secondary | ICD-10-CM | POA: Diagnosis not present

## 2012-01-25 DIAGNOSIS — C61 Malignant neoplasm of prostate: Secondary | ICD-10-CM | POA: Diagnosis not present

## 2012-01-25 LAB — COMPREHENSIVE METABOLIC PANEL
Alkaline Phosphatase: 52 U/L (ref 39–117)
BUN: 16 mg/dL (ref 6–23)
GFR calc Af Amer: 71 mL/min — ABNORMAL LOW (ref >90)
Glucose, Bld: 90 mg/dL (ref 70–99)
Potassium: 5 mEq/L (ref 3.5–5.1)
Total Bilirubin: 0.8 mg/dL (ref 0.3–1.2)
Total Protein: 6.4 g/dL (ref 6.0–8.3)

## 2012-01-25 LAB — SURGICAL PCR SCREEN: Staphylococcus aureus: NEGATIVE

## 2012-01-25 LAB — CBC
HCT: 43.6 % (ref 39.0–52.0)
Platelets: 197 10*3/uL (ref 150–400)
RBC: 4.61 MIL/uL (ref 4.22–5.81)
RDW: 13.1 % (ref 11.5–15.5)
WBC: 8.5 10*3/uL (ref 4.0–10.5)

## 2012-01-25 LAB — PROTIME-INR: Prothrombin Time: 14.3 seconds (ref 11.6–15.2)

## 2012-01-25 NOTE — Patient Instructions (Addendum)
20 Jeremy Kelley  01/25/2012   Your procedure is scheduled on:  Thursday 01-27-2012  Report to [facility] at  AM.  1130  Call this number if you have problems the morning of surgery: [phone]   Remember:   Do not eat food:After Midnight. Wednesday 01-26-2012   May have clear liquids :  Thursday 01-27-2012 0800 then nothing until after surgery.  Clear liquids include soda, tea, black coffee, apple or white grape juice, broth.  Take these medicines the morning of surgery with A SIP OF WATER:  None   Do not wear jewelry, make-up or nail polish.  Do not wear lotions, powders, or perfumes. You may wear deodorant.  Do not shave 48 hours prior to surgery. Men may shave face and neck.  Do not bring valuables to the hospital.  Contacts, dentures or bridgework may not be worn into surgery.  Leave suitcase in the car. After surgery it may be brought to your room.  For patients admitted to the hospital, checkout time is 11:00 AM the day of discharge.   Patients discharged the day of surgery will not be allowed to drive home.  Name and phone number of your driver:   Special Instructions: CHG Shower Use Special Wash: 1/2 bottle night before surgery and 1/2 bottle morning of surgery.   Please read over the following fact sheets that you were given: MRSA Information,deep breathing coughing post surgery

## 2012-01-25 NOTE — Progress Notes (Signed)
1640 EKG done 10-15-13and CXR 09-28-11 in Epic

## 2012-01-26 DIAGNOSIS — C679 Malignant neoplasm of bladder, unspecified: Secondary | ICD-10-CM | POA: Diagnosis not present

## 2012-01-26 DIAGNOSIS — C669 Malignant neoplasm of unspecified ureter: Secondary | ICD-10-CM | POA: Diagnosis not present

## 2012-01-26 NOTE — H&P (Signed)
History of Present Illness  Jeremy Kelley is an 78 year old gentleman with the following urologic history:  1) Prostate cancer: He is s/p a laparoscopic radical prostatectomy by Dr. Osvaldo Human in [place] in August 2001. His PSA has thus far been undetectable since treatment.  2) Incontinence: He has mixed urge and stress incontinence which is very mild. He uses oxybutynin 15 mg for treatment which has benefited him although he typically wore one pad per day. He is s/p a male sling by Dr. Sherron Monday in May 2012.     3) Erectile dysfunction: He was not responsive to PDE-5 inhibitors.  He uses MUSE 125 mcg prn with good success.  4) Urothelial carcinoma: He underwent a TUR for a bladder polyp in 2004. He was found to have bladder tumors by Dr. Sherron Monday on cystoscopy in 2013 and underwent TURBT by me in June 2013 which demonstrated multifocal low grade, Ta urothelial carcinoma of the bladder and a low grade left ureteral urothelial carcinoma treated with laser ablation.  June 2013: TURBT (3 cm right bladder tumor) - Low grade Ta, Laser ablation of low grade urothelial carcinoma of the left ureter  5) Balanoposthitis: He has used Lotrisone cream and vaseline prn.  Interval history:  He follows up today for further evaluation of his urothelial carcinoma of the bladder left ureter which was identified the summer. He states that he has had no change in his overall health and feels that he is urinating very well and with no incontinence. He recently returned to [place] after visiting [place] and [place]. He is scheduled to undergo cystoscopy in the operating room with left ureteroscopy for surveillance of his urothelial carcinoma tomorrow.   Past Medical History Problems  1. History of  Prostate Cancer V10.46  Surgical History Problems  1. History of  Cystoscopy With Fulguration Medium Lesion (2-5cm) 2. History of  Cystoscopy With Insertion Of Ureteral Stent Left 3. History of  Cystoscopy With  Resection Of Tumor 4. History of  Hernia Repair 5. History of  Operation For Correction Of Male Urinary Incontinence 6. History of  Prostatect Retropubic Radical W/ Nerve Sparing Laparoscopic 7. History of  Varicose Vein Ligation  Current Meds 1. Adult Aspirin Low Strength 81 MG Oral Tablet Dispersible; Therapy: (Recorded:17Jun2009) to 2. Amlodipine Besy-Benazepril HCl 5-10 MG Oral Capsule; Therapy: (Recorded:17Jun2009) to 3. Bisoprolol Fumarate 5 MG Oral Tablet; Therapy: 19Oct2011 to 4. BL Vitamin C 500 MG TABS; Therapy: (Recorded:17Jun2009) to 5. Clotrimazole-Betamethasone 1-0.05 % External Cream; Therapy: (Recorded:31Jul2008) to 6. Cymbalta 30 MG Oral Capsule Delayed Release Particles; Therapy: (Recorded:05Mar2012) to 7. Daily Multiple Vitamins TABS; Therapy: (Recorded:17Jun2009) to 8. MetroNIDAZOLE 500 MG Oral Tablet; Therapy: 19Jun2013 to 9. Muse 125 MCG Urethral Pellet; USE 1 PELLET AS NEEDED ASDIRECTED; Therapy: 18Jun2010  to (Evaluate:30Aug2012); Last Rx:13Jun2012 10. Polyethylene Glycol 3350 Oral Powder; Therapy: 25Jun2013 to 11. Pravastatin Sodium 20 MG Oral Tablet; Therapy: 19Oct2011 to  Allergies Medication  1. No Known Drug Allergies  Family History Denied  1. Family history of  Family Health Status Number Of Children  Social History Problems  1. Alcohol Use drinks wine 2. Caffeine Use daily 3. Family history of  Death In The Family Father age 63; ? 4. Family history of  Death In The Family Mother age 94;? 5. Marital History - Single 6. Never A Smoker 7. Occupation: retired Denied  8. Tobacco Use  Review of Systems  Genitourinary: no hematuria.  Constitutional: no fever.    Vitals Vital Signs [Data Includes: Last 1 Day]  16Oct2013 12:41PM  BMI Calculated: 20.76 BSA Calculated: 1.82 Height: 5 ft 10 in Weight: 145 lb  Blood Pressure: 164 / 67 Temperature: 97.8 F Heart Rate: 54  Physical Exam Constitutional: Well nourished and well developed .  No acute distress.  ENT:. The ears and nose are normal in appearance.  Neck: The appearance of the neck is normal and no neck mass is present.  Pulmonary: No respiratory distress, normal respiratory rhythm and effort and clear bilateral breath sounds.  Cardiovascular: Heart rate and rhythm are normal . No peripheral edema.  Neuro/Psych:. Mood and affect are appropriate.    Results/Data Urine [Data Includes: Last 1 Day]   16Oct2013  COLOR YELLOW   APPEARANCE CLEAR   SPECIFIC GRAVITY 1.025   pH 5.5   GLUCOSE NEG mg/dL  BILIRUBIN NEG   KETONE NEG mg/dL  BLOOD NEG   PROTEIN NEG mg/dL  UROBILINOGEN 0.2 mg/dL  NITRITE NEG   LEUKOCYTE ESTERASE NEG    Assessment Assessed  1. Malignant Neoplasm Of The Ureter 189.2 2. Bladder Cancer 188.9  Plan  Bladder Cancer (188.9)  1. Cysto  Requested for: 16Oct2013 2. Follow-up Month x 3 Office  Follow-up  Requested for: 16Oct2013  UA With REFLEX  Status: Entered in Error Ordered Today; For: Health Maintenance (V70.0); Ordered By: Heloise Purpura  PerformLoney Loh  Due: 18Oct2013 Marked Important; Last Updated By: Larena Sox   Discussion/Summary  1. Urothelial carcinoma of the bladder left ureter: He will proceed with cystoscopy and left ureteroscopy tomorrow for surveillance of his urothelial carcinoma. We have reviewed the potential risks and complications of this procedure as well as the expected recovery process. He is agreeable to proceeding gives his informed consent.  2. Prostate cancer: He would be due for a surveillance PSA in March.  3. Incontinence: This remains well controlled following his sling procedure in 2012.  Cc: Dr. Thomos Lemons

## 2012-01-27 ENCOUNTER — Encounter (HOSPITAL_COMMUNITY): Payer: Self-pay | Admitting: Anesthesiology

## 2012-01-27 ENCOUNTER — Encounter (HOSPITAL_COMMUNITY)
Admission: RE | Disposition: A | Discharge status: Home / Self Care | Payer: Self-pay | Source: Ambulatory Visit | Attending: Urology

## 2012-01-27 ENCOUNTER — Ambulatory Visit (HOSPITAL_COMMUNITY): Payer: Medicare Other | Admitting: Anesthesiology

## 2012-01-27 ENCOUNTER — Observation Stay (HOSPITAL_COMMUNITY)
Admission: RE | Admit: 2012-01-27 | Discharge: 2012-01-28 | Disposition: A | Discharge status: Home / Self Care | Payer: Medicare Other | Source: Ambulatory Visit | Attending: Urology | Admitting: Urology

## 2012-01-27 ENCOUNTER — Encounter (HOSPITAL_COMMUNITY): Payer: Self-pay | Admitting: *Deleted

## 2012-01-27 DIAGNOSIS — Z01812 Encounter for preprocedural laboratory examination: Secondary | ICD-10-CM | POA: Insufficient documentation

## 2012-01-27 DIAGNOSIS — N3946 Mixed incontinence: Secondary | ICD-10-CM | POA: Diagnosis not present

## 2012-01-27 DIAGNOSIS — C679 Malignant neoplasm of bladder, unspecified: Principal | ICD-10-CM | POA: Insufficient documentation

## 2012-01-27 DIAGNOSIS — C61 Malignant neoplasm of prostate: Secondary | ICD-10-CM | POA: Diagnosis not present

## 2012-01-27 DIAGNOSIS — N189 Chronic kidney disease, unspecified: Secondary | ICD-10-CM | POA: Diagnosis not present

## 2012-01-27 DIAGNOSIS — C669 Malignant neoplasm of unspecified ureter: Secondary | ICD-10-CM | POA: Insufficient documentation

## 2012-01-27 DIAGNOSIS — Z79899 Other long term (current) drug therapy: Secondary | ICD-10-CM | POA: Insufficient documentation

## 2012-01-27 DIAGNOSIS — D126 Benign neoplasm of colon, unspecified: Secondary | ICD-10-CM | POA: Diagnosis not present

## 2012-01-27 DIAGNOSIS — R319 Hematuria, unspecified: Secondary | ICD-10-CM | POA: Diagnosis not present

## 2012-01-27 DIAGNOSIS — D414 Neoplasm of uncertain behavior of bladder: Secondary | ICD-10-CM | POA: Diagnosis not present

## 2012-01-27 DIAGNOSIS — N476 Balanoposthitis: Secondary | ICD-10-CM | POA: Insufficient documentation

## 2012-01-27 DIAGNOSIS — N529 Male erectile dysfunction, unspecified: Secondary | ICD-10-CM | POA: Insufficient documentation

## 2012-01-27 DIAGNOSIS — Z7982 Long term (current) use of aspirin: Secondary | ICD-10-CM | POA: Insufficient documentation

## 2012-01-27 HISTORY — PX: URETEROSCOPY: SHX842

## 2012-01-27 HISTORY — PX: CYSTOSCOPY W/ RETROGRADES: SHX1426

## 2012-01-27 SURGERY — URETEROSCOPY
Anesthesia: General | Laterality: Left

## 2012-01-27 MED ORDER — EPHEDRINE SULFATE 50 MG/ML IJ SOLN
INTRAMUSCULAR | Status: DC | PRN
  Administered 2012-01-27 (×2): 5 mg via INTRAVENOUS

## 2012-01-27 MED ORDER — ASPIRIN EC 81 MG PO TBEC
81.0000 mg | DELAYED_RELEASE_TABLET | Freq: Every day | ORAL | Status: DC
  Filled 2012-01-27: qty 1

## 2012-01-27 MED ORDER — MIDAZOLAM HCL 5 MG/5ML IJ SOLN
INTRAMUSCULAR | Status: DC | PRN
  Administered 2012-01-27: .25 mg via INTRAVENOUS

## 2012-01-27 MED ORDER — IOHEXOL 300 MG/ML  SOLN
INTRAMUSCULAR | Status: DC | PRN
  Administered 2012-01-27: 15 mL

## 2012-01-27 MED ORDER — SODIUM CHLORIDE 0.9 % IJ SOLN: 3.0000 mL | INTRAMUSCULAR | Status: DC | PRN

## 2012-01-27 MED ORDER — CIPROFLOXACIN IN D5W 400 MG/200ML IV SOLN
INTRAVENOUS | Status: AC
  Filled 2012-01-27: qty 200

## 2012-01-27 MED ORDER — LOSARTAN POTASSIUM 25 MG PO TABS
25.0000 mg | ORAL_TABLET | Freq: Every day | ORAL | Status: DC
  Administered 2012-01-27: 25 mg via ORAL
  Filled 2012-01-27 (×2): qty 1

## 2012-01-27 MED ORDER — CIPROFLOXACIN IN D5W 400 MG/200ML IV SOLN
INTRAVENOUS | Status: DC | PRN
  Administered 2012-01-27: 100 mg via INTRAVENOUS

## 2012-01-27 MED ORDER — LIDOCAINE HCL (CARDIAC) 20 MG/ML IV SOLN
INTRAVENOUS | Status: DC | PRN
  Administered 2012-01-27: 50 mg via INTRAVENOUS

## 2012-01-27 MED ORDER — FENTANYL CITRATE 0.05 MG/ML IJ SOLN
INTRAMUSCULAR | Status: DC | PRN
  Administered 2012-01-27 (×2): 25 ug via INTRAVENOUS

## 2012-01-27 MED ORDER — BISOPROLOL FUMARATE 5 MG PO TABS
2.5000 mg | ORAL_TABLET | Freq: Every day | ORAL | Status: DC
  Administered 2012-01-27: 2.5 mg via ORAL
  Filled 2012-01-27 (×2): qty 0.5

## 2012-01-27 MED ORDER — LACTATED RINGERS IV SOLN
INTRAVENOUS | Status: DC | PRN
  Administered 2012-01-27: 13:00:00 via INTRAVENOUS

## 2012-01-27 MED ORDER — HYDROCODONE-ACETAMINOPHEN 5-325 MG PO TABS
1.0000 | ORAL_TABLET | ORAL | Status: DC | PRN
  Administered 2012-01-27 (×2): 1 via ORAL
  Filled 2012-01-27 (×2): qty 1

## 2012-01-27 MED ORDER — SODIUM CHLORIDE 0.9 % IR SOLN
Status: DC | PRN
  Administered 2012-01-27: 1000 mL

## 2012-01-27 MED ORDER — CEFAZOLIN SODIUM-DEXTROSE 2-3 GM-% IV SOLR
INTRAVENOUS | Status: DC | PRN
  Administered 2012-01-27: 2 g via INTRAVENOUS

## 2012-01-27 MED ORDER — SODIUM CHLORIDE 0.9 % IV SOLN: 250.0000 mL | INTRAVENOUS | Status: DC | PRN

## 2012-01-27 MED ORDER — PROPOFOL 10 MG/ML IV EMUL
INTRAVENOUS | Status: DC | PRN
  Administered 2012-01-27: 100 mg via INTRAVENOUS

## 2012-01-27 MED ORDER — CIPROFLOXACIN IN D5W 400 MG/200ML IV SOLN: 400.0000 mg | INTRAVENOUS | Status: DC

## 2012-01-27 MED ORDER — SIMVASTATIN 10 MG PO TABS
10.0000 mg | ORAL_TABLET | Freq: Every day | ORAL | Status: DC
  Administered 2012-01-27: 10 mg via ORAL
  Filled 2012-01-27 (×2): qty 1

## 2012-01-27 MED ORDER — SODIUM CHLORIDE 0.9 % IJ SOLN
3.0000 mL | Freq: Two times a day (BID) | INTRAMUSCULAR | Status: DC
  Administered 2012-01-27: 3 mL via INTRAVENOUS

## 2012-01-27 MED ORDER — ONDANSETRON HCL 4 MG/2ML IJ SOLN
INTRAMUSCULAR | Status: DC | PRN
  Administered 2012-01-27 (×2): 2 mg via INTRAVENOUS

## 2012-01-27 MED ORDER — LACTATED RINGERS IV SOLN
INTRAVENOUS | Status: DC
  Administered 2012-01-27: 1000 mL via INTRAVENOUS

## 2012-01-27 MED ORDER — CEFAZOLIN SODIUM-DEXTROSE 2-3 GM-% IV SOLR
INTRAVENOUS | Status: AC
  Filled 2012-01-27: qty 50

## 2012-01-27 MED ORDER — PROMETHAZINE HCL 25 MG/ML IJ SOLN: 6.2500 mg | INTRAMUSCULAR | Status: DC | PRN

## 2012-01-27 MED ORDER — HYDROMORPHONE HCL PF 1 MG/ML IJ SOLN: 0.2500 mg | INTRAMUSCULAR | Status: DC | PRN

## 2012-01-27 MED ORDER — SODIUM CHLORIDE 0.9 % IR SOLN
Status: DC | PRN
  Administered 2012-01-27: 4000 mL

## 2012-01-27 MED ORDER — HYDROCODONE-ACETAMINOPHEN 5-325 MG PO TABS: 1.0000 | ORAL_TABLET | Freq: Four times a day (QID) | ORAL | Status: DC | PRN

## 2012-01-27 MED ORDER — ACETAMINOPHEN 10 MG/ML IV SOLN
INTRAVENOUS | Status: AC
  Filled 2012-01-27: qty 100

## 2012-01-27 MED ORDER — IOHEXOL 300 MG/ML  SOLN
INTRAMUSCULAR | Status: AC
  Filled 2012-01-27: qty 1

## 2012-01-27 SURGICAL SUPPLY — 21 items
ADAPTER CATH URET PLST 4-6FR (CATHETERS) ×1 IMPLANT
ADPR CATH URET STRL DISP 4-6FR (CATHETERS)
BAG URO CATCHER STRL LF (DRAPE) ×2 IMPLANT
BASKET ZERO TIP NITINOL 2.4FR (BASKET) IMPLANT
BSKT STON RTRVL ZERO TP 2.4FR (BASKET)
CATH INTERMIT  6FR 70CM (CATHETERS) ×1 IMPLANT
CATH URET 5FR 28IN CONE TIP (BALLOONS)
CATH URET 5FR 70CM CONE TIP (BALLOONS) IMPLANT
CLOTH BEACON ORANGE TIMEOUT ST (SAFETY) ×2 IMPLANT
DRAPE CAMERA CLOSED 9X96 (DRAPES) ×2 IMPLANT
GLOVE BIOGEL M STRL SZ7.5 (GLOVE) ×2 IMPLANT
GOWN PREVENTION PLUS XLARGE (GOWN DISPOSABLE) ×2 IMPLANT
GOWN STRL NON-REIN LRG LVL3 (GOWN DISPOSABLE) ×4 IMPLANT
GUIDEWIRE ANG ZIPWIRE 038X150 (WIRE) IMPLANT
GUIDEWIRE STR DUAL SENSOR (WIRE) ×3 IMPLANT
LASER FIBER DISP (UROLOGICAL SUPPLIES) IMPLANT
MANIFOLD NEPTUNE II (INSTRUMENTS) ×2 IMPLANT
NS IRRIG 1000ML POUR BTL (IV SOLUTION) ×1 IMPLANT
PACK CYSTO (CUSTOM PROCEDURE TRAY) ×2 IMPLANT
TUBING CONNECTING 10 (TUBING) ×2 IMPLANT
WIRE COONS/BENSON .038X145CM (WIRE) ×1 IMPLANT

## 2012-01-27 NOTE — Transfer of Care (Signed)
Immediate Anesthesia Transfer of Care Note  Patient: Jeremy Kelley  Procedure(s) Performed: Procedure(s) (LRB) with comments: URETEROSCOPY (Left) - CYSTO, Left RETROGRADE PYELOGRPHY, LEFT URETEROSCOPY  CYSTOSCOPY WITH RETROGRADE PYELOGRAM (Left)  Patient Location: PACU  Anesthesia Type: General  Level of Consciousness: awake, alert , oriented and patient cooperative  Airway & Oxygen Therapy: Patient Spontanous Breathing and Patient connected to face mask oxygen  Post-op Assessment: Report given to PACU RN, Post -op Vital signs reviewed and stable and Patient moving all extremities  Post vital signs: Reviewed and stable  Complications: No apparent anesthesia complications

## 2012-01-27 NOTE — Interval H&P Note (Signed)
History and Physical Interval Note:  01/27/2012 1:03 PM  Jeremy Kelley  has presented today for surgery, with the diagnosis of BLADDER CANCER  The various methods of treatment have been discussed with the patient and family. After consideration of risks, benefits and other options for treatment, the patient has consented to  Procedure(s) (LRB) with comments: URETEROSCOPY (Left) - CYSTO, BILATERAL RETROGRADE PYELOGRPHY, LEFT URETEROSCOPY  CYSTOSCOPY WITH RETROGRADE PYELOGRAM (Bilateral) as a surgical intervention .  The patient's history has been reviewed, patient examined, no change in status, stable for surgery.  I have reviewed the patient's chart and labs.  Questions were answered to the patient's satisfaction.     Silver Parkey,LES

## 2012-01-27 NOTE — Op Note (Signed)
Preoperative diagnosis: Urothelial carcinoma of the bladder and left ureter  Postoperative diagnosis: Urothelial carcinoma of the bladder and left ureter  Procedure:  1. Cystoscopy 2. Left ureteroscopy  3. Left retrograde pyelography with interpretation 4. Saline bladder washing for cytology  Surgeon: Moody Bruins. M.D.  Anesthesia: General  Complications: None  Intraoperative findings: Left retrograde pyelography demonstrated no ureteral or renal collecting system filling defects. 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  EBL: Minimal  Specimens: 1. Bladder cytology  Disposition of specimens: Pathology  Indication: Jeremy Kelley is a 78 y.o. year old patient with urothelial carcinoma of the bladder and left ureter. He presents today for 3 months surveillance after his initial diagnosis. After reviewing the management options for treatment, the patient elected to proceed with the above surgical procedure(s). We have discussed the potential benefits and risks of the procedure, side effects of the proposed treatment, the likelihood of the patient achieving the goals of the procedure, and any potential problems that might occur during the procedure or recuperation. Informed consent has been obtained.  Description of procedure:  The patient was taken to the operating room and general anesthesia was induced.  The patient was placed in the dorsal lithotomy position, prepped and draped in the usual sterile fashion, and preoperative antibiotics were administered. A preoperative time-out was performed.   Cystourethroscopy was performed.  The patient's urethra was examined. The anterior urethra was normal.  The prostatic urethra was surgically absent. The bladder was then systematically examined in its entirety. There was no evidence for any bladder tumors or stones.  There was moderate trabeculation throughout the bladder. A saline bladder washing for cytology  was performed.  Attention then turned to the left ureteral orifice and a ureteral catheter was used to intubate the ureteral orifice.  Omnipaque contrast was injected through the ureteral catheter and a retrograde pyelogram was performed with findings as dictated above.  The semirigid ureteroscope was then used to perform ureteroscopy of the left ureter.  While there was evidence of the previous laser ablation in the distal ureter, no recurrent tumors were noted. Considering the normal radiographic findings of the proximal ureter and renal collecting system, it was not felt necessary to visually inspect the remainder of the ureter. The ureteroscope was removed.  A ureteral stent was not felt to be necessary.  The bladder was then emptied and the procedure ended.  The patient appeared to tolerate the procedure well and without complications.  The patient was able to be awakened and transferred to the recovery unit in satisfactory condition.

## 2012-01-27 NOTE — Anesthesia Preprocedure Evaluation (Addendum)
Anesthesia Evaluation  Patient identified by MRN, date of birth, ID band Patient awake    Reviewed: Allergy & Precautions, H&P , NPO status , Patient's Chart, lab work & pertinent test results  Airway Mallampati: II TM Distance: >3 FB Neck ROM: Full    Dental No notable dental hx.    Pulmonary neg pulmonary ROS,  breath sounds clear to auscultation  Pulmonary exam normal       Cardiovascular hypertension, Pt. on medications and Pt. on home beta blockers + dysrhythmias + Valvular Problems/Murmurs Rhythm:Regular Rate:Normal     Neuro/Psych negative neurological ROS  negative psych ROS   GI/Hepatic negative GI ROS, (+) Hepatitis -, AH/O Hep A in 1960's   Endo/Other  negative endocrine ROS  Renal/GU Renal diseasenegative Renal ROS  negative genitourinary   Musculoskeletal negative musculoskeletal ROS (+)   Abdominal   Peds negative pediatric ROS (+)  Hematology negative hematology ROS (+)   Anesthesia Other Findings   Reproductive/Obstetrics negative OB ROS                          Anesthesia Physical Anesthesia Plan  ASA: III  Anesthesia Plan: General   Post-op Pain Management:    Induction: Intravenous  Airway Management Planned: LMA  Additional Equipment:   Intra-op Plan:   Post-operative Plan: Extubation in OR  Informed Consent: I have reviewed the patients History and Physical, chart, labs and discussed the procedure including the risks, benefits and alternatives for the proposed anesthesia with the patient or authorized representative who has indicated his/her understanding and acceptance.   Dental advisory given  Plan Discussed with: CRNA  Anesthesia Plan Comments:         Anesthesia Quick Evaluation

## 2012-01-27 NOTE — Anesthesia Postprocedure Evaluation (Signed)
  Anesthesia Post-op Note  Patient: Jeremy Kelley  Procedure(s) Performed: Procedure(s) (LRB): URETEROSCOPY (Left) CYSTOSCOPY WITH RETROGRADE PYELOGRAM (Left)  Patient Location: PACU  Anesthesia Type: General  Level of Consciousness: awake and alert   Airway and Oxygen Therapy: Patient Spontanous Breathing  Post-op Pain: mild  Post-op Assessment: Post-op Vital signs reviewed, Patient's Cardiovascular Status Stable, Respiratory Function Stable, Patent Airway and No signs of Nausea or vomiting  Post-op Vital Signs: stable  Complications: No apparent anesthesia complications

## 2012-01-27 NOTE — Preoperative (Signed)
Beta Blockers   Reason not to administer Beta Blockers:Not Applicable 

## 2012-01-27 NOTE — Progress Notes (Signed)
Instructed patient to send valuables home with family. Patient refused to let them take his belongings. Bag taken to nurses desk will take them to room post op.

## 2012-01-28 ENCOUNTER — Encounter (HOSPITAL_COMMUNITY): Payer: Self-pay | Admitting: Urology

## 2012-01-28 NOTE — Progress Notes (Signed)
Patient ID: DARVON UELAND, male   DOB: Sep 28, 1926, 78 y.o.   MRN: [id number]  1 Day Post-Op Subjective: Doing well.  Minimal pain. Voiding well.  Objective: Vital signs in last 24 hours: Temp:  [97.4 F (36.3 C)-97.7 F (36.5 C)] 97.4 F (36.3 C) (10/18 0528) Pulse Rate:  [51-70] 51  (10/18 0528) Resp:  [15-20] 16  (10/18 0528) BP: (150-201)/(46-71) 157/57 mmHg (10/18 0528) SpO2:  [98 %-100 %] 100 % (10/18 0528) Weight:  [69.536 kg (153 lb 4.8 oz)] 69.536 kg (153 lb 4.8 oz) (10/17 1519)  Intake/Output from previous day: 10/17 0701 - 10/18 0700 In: 1035 [P.O.:360; I.V.:675] Out: 1070 [Urine:1070] Intake/Output this shift: Total I/O In: -  Out: 820 [Urine:820]  Physical Exam:  General: Alert and oriented Abdomen: Soft, ND    Assessment/Plan: - D/C home   LOS: 1 day   Marlyce Mcdougald,LES 01/28/2012, 6:56 AM

## 2012-01-28 NOTE — Discharge Summary (Signed)
Physician Discharge Summary  Patient ID: Jeremy Kelley MRN: [id number] DOB/AGE: 1926-07-03 78 y.o.  Admit date: 01/27/2012 Discharge date: 01/28/2012  Admission Diagnoses: Urothelial carcinoma of the left ureter and bladder  Discharge Diagnoses:  Same   Discharged Condition: good  Hospital Course: He underwent cystoscopy and left ureteroscopy without evidence for tumor recurrence.  He was voiding well postoperatively and remained stable.  He was able to be discharged on POD # 1.    Disposition: 01-Home or Self Care     Medication List     As of 01/28/2012  6:58 AM    TAKE these medications         alprostadil 125 MCG pellet   Commonly known as: MUSE   1 each (125 mcg total) by Transurethral route as needed for erectile dysfunction. use no more than 3 times per week      aspirin EC 81 MG tablet   Take 81 mg by mouth daily.      bisoprolol 5 MG tablet   Commonly known as: ZEBETA   Take 0.5 tablets (2.5 mg total) by mouth at bedtime. 1/2 tablet by mouth once a day      HYDROcodone-acetaminophen 5-325 MG per tablet   Commonly known as: NORCO/VICODIN   Take 1-2 tablets by mouth every 6 (six) hours as needed for pain.      losartan 25 MG tablet   Commonly known as: COZAAR   Take 1 tablet (25 mg total) by mouth daily.      methylcellulose 1 % ophthalmic solution   Commonly known as: ARTIFICIAL TEARS   Place 1 drop into both eyes daily as needed. As needed for air plane travel      multivitamin tablet   Take 1 tablet by mouth daily with breakfast.      pravastatin 20 MG tablet   Commonly known as: PRAVACHOL   Take 1 tablet (20 mg total) by mouth daily.      vitamin C 500 MG tablet   Commonly known as: ASCORBIC ACID   Take 500 mg by mouth daily.           Follow-up Information    Follow up with Crecencio Mc, MD. (Will call to schedule)    Contact information:   [address] [place] [place]  [address] [phone]          Signed: Crecencio Mc 01/28/2012, 6:58 AM

## 2012-02-01 ENCOUNTER — Ambulatory Visit (INDEPENDENT_AMBULATORY_CARE_PROVIDER_SITE_OTHER): Payer: Medicare Other | Admitting: Internal Medicine

## 2012-02-01 ENCOUNTER — Encounter: Payer: Self-pay | Admitting: Internal Medicine

## 2012-02-01 VITALS — BP 142/66 | Temp 97.9°F | Wt 153.0 lb

## 2012-02-01 DIAGNOSIS — Z23 Encounter for immunization: Secondary | ICD-10-CM

## 2012-02-01 DIAGNOSIS — C679 Malignant neoplasm of bladder, unspecified: Secondary | ICD-10-CM | POA: Diagnosis not present

## 2012-02-01 DIAGNOSIS — I10 Essential (primary) hypertension: Secondary | ICD-10-CM

## 2012-02-01 NOTE — Assessment & Plan Note (Signed)
Follow up cystoscopy negative.  Bladder brushing showed slight cytologic atypia.  Patient scheduled to follow up with Dr. Laverle Patter in 6 months.

## 2012-02-01 NOTE — Assessment & Plan Note (Signed)
Patient advised to start losartan 25 mg.  If he experiences dizziness, he will decrease losartan to 12.5 mg.  Continue bisoprolol.

## 2012-02-01 NOTE — Progress Notes (Signed)
Subjective:    Patient ID: Jeremy Kelley, male    DOB: 02-Jul-1926, 78 y.o.   MRN: [id number]  HPI  78 y/o white male for follow up re: cystoscopy.  Procedure completed and no sign of recurrence.  Patient experiencing mild constipation.    Hypertension - stable.  He has yet to start losartan.  Review of Systems Negative for chest pain  Past Medical History  Diagnosis Date  . History of prostate cancer 2001  . Hypertension   . Colon polyps   . Urinary incontinence     chronic urinary incontinence after prostate surgery  . Cataract   . Diverticulosis   . Phimosis   . Carotid artery stenosis 11/2008    bilateral 40-59% stenosis  . Chronic kidney disease     bladder tumor   . Hepatitis     doesn't know which type 1969    History   Social History  . Marital Status: Single    Spouse Name: N/A    Number of Children: N/A  . Years of Education: N/A   Occupational History  . Not on file.   Social History Main Topics  . Smoking status: Passive Smoke Exposure - Never Smoker    Types: Cigars  . Smokeless tobacco: Never Used   Comment: occasionally smokes a cigar  . Alcohol Use: 0.0 oz/week    6-7 Glasses of wine per week     3-4 glasses of wine per week-no history of excessinve alcohol use  . Drug Use: No  . Sexually Active: Not on file   Other Topics Concern  . Not on file   Social History Narrative   Patient is single, has never been married.  Does not have any children.  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  No history of excessive alcohol use.  Occasionally smokes a cigar.Spends winters in [place]         Past Surgical History  Procedure Date  . Inguinal hernia repair   . Varicose vein surgery   . Other surgical history     s/p laparoscopic prostate surgery  . Skin cancer excision     a/p skin cancer right foot-non melanoma  . Bladder suspension 08/18/10    [name]  . Eye  surgery 09/24/10    left eye. "Retina surgery"  . Transurethral resection of bladder tumor 10/01/2011    Procedure: TRANSURETHRAL RESECTION OF BLADDER TUMOR (TURBT);  Surgeon: Crecencio Mc, MD;  Location: [facility];  Service: Urology;  Laterality: N/A;  . Esophageal biopsy 10/01/2011    Procedure: BIOPSY;  Surgeon: Crecencio Mc, MD;  Location: [facility];  Service: Urology;;  biopsy of bladder tumor  . Ureteroscopy 01/27/2012    Procedure: URETEROSCOPY;  Surgeon: Crecencio Mc, MD;  Location: [facility];  Service: Urology;  Laterality: Left;  CYSTO, Left RETROGRADE PYELOGRPHY, LEFT URETEROSCOPY   . Cystoscopy w/ retrogrades 01/27/2012    Procedure: CYSTOSCOPY WITH RETROGRADE PYELOGRAM;  Surgeon: Crecencio Mc, MD;  Location: [facility];  Service: Urology;  Laterality: Left;    No family history on file.  Allergies  Allergen Reactions  . Lisinopril     REACTION: dizziness    Current Outpatient Prescriptions on File Prior to Visit  Medication Sig Dispense Refill  . alprostadil (MUSE) 125 MCG pellet 1 each (125 mcg total) by Transurethral route as needed for erectile dysfunction. use no more than 3 times per week  6 each  0  . aspirin EC 81 MG tablet Take 81 mg by mouth daily.      . bisoprolol (ZEBETA) 5 MG tablet Take 0.5 tablets (2.5 mg total) by mouth at bedtime. 1/2 tablet by mouth once a day  90 tablet  1  . HYDROcodone-acetaminophen (NORCO/VICODIN) 5-325 MG per tablet Take 1-2 tablets by mouth every 6 (six) hours as needed for pain.  25 tablet  0  . losartan (COZAAR) 25 MG tablet Take 1 tablet (25 mg total) by mouth daily.  180 tablet  1  . methylcellulose (ARTIFICIAL TEARS) 1 % ophthalmic solution Place 1 drop into both eyes daily as needed. As needed for air plane travel      . Multiple Vitamin (MULTIVITAMIN) tablet Take 1 tablet by mouth daily with breakfast.       . pravastatin (PRAVACHOL) 20 MG tablet Take 1 tablet (20 mg total) by mouth daily.  180 tablet  1  . vitamin C (ASCORBIC ACID) 500 MG tablet  Take 500 mg by mouth daily.        BP 142/66  Temp 97.9 F (36.6 C) (Oral)  Wt 153 lb (69.4 kg)       Objective:   Physical Exam  Constitutional: He appears well-developed and well-nourished.  Neck: Neck supple.       No carotid bruit  Cardiovascular: Normal rate, regular rhythm and normal heart sounds.   No murmur heard. Pulmonary/Chest: Breath sounds normal. He has no wheezes.          Assessment & Plan:

## 2012-02-07 DIAGNOSIS — I8310 Varicose veins of unspecified lower extremity with inflammation: Secondary | ICD-10-CM | POA: Diagnosis not present

## 2012-07-18 ENCOUNTER — Ambulatory Visit (INDEPENDENT_AMBULATORY_CARE_PROVIDER_SITE_OTHER): Payer: Medicare Other | Admitting: Internal Medicine

## 2012-07-18 ENCOUNTER — Encounter: Payer: Self-pay | Admitting: Internal Medicine

## 2012-07-18 VITALS — BP 134/68 | HR 76 | Temp 98.2°F | Wt 146.0 lb

## 2012-07-18 DIAGNOSIS — Z8546 Personal history of malignant neoplasm of prostate: Secondary | ICD-10-CM | POA: Diagnosis not present

## 2012-07-18 DIAGNOSIS — I10 Essential (primary) hypertension: Secondary | ICD-10-CM | POA: Diagnosis not present

## 2012-07-18 DIAGNOSIS — C679 Malignant neoplasm of bladder, unspecified: Secondary | ICD-10-CM | POA: Diagnosis not present

## 2012-07-18 MED ORDER — PRAVASTATIN SODIUM 20 MG PO TABS: 20.0000 mg | ORAL_TABLET | Freq: Every day | ORAL | Status: DC

## 2012-07-18 MED ORDER — BISOPROLOL FUMARATE 5 MG PO TABS: 2.5000 mg | ORAL_TABLET | Freq: Every day | ORAL | Status: DC

## 2012-07-18 MED ORDER — LOSARTAN POTASSIUM 25 MG PO TABS: 25.0000 mg | ORAL_TABLET | Freq: Every day | ORAL | Status: DC

## 2012-07-18 MED ORDER — ALPROSTADIL (VASODILATOR) 125 MCG UR PLLT: 125.0000 ug | PELLET | URETHRAL | Status: DC | PRN

## 2012-07-18 NOTE — Assessment & Plan Note (Signed)
Patient's losartan dose decreased to 12.5 mg. While living in [place] he experienced some dizziness with climbing long flight of stairs. Continue same dose of bisoprolol. BP: 134/68 mmHg  BUN and creatinine are stable.

## 2012-07-18 NOTE — Progress Notes (Signed)
Subjective:    Patient ID: Jeremy Kelley, male    DOB: 26-Feb-1927, 79 y.o.   MRN: [id number]  HPI  79 year old white male with history of prostate cancer, bladder cancer, hypertension and hyperlipidemia for routine followup. Patient recently returned from [place]. He reports history of seasonal allergies. He was prescribed nonsedating antihistamine with resolution of his symptoms.  He is followed by a physician in [place]. He experienced some dizziness when climbing long flight of stairs. His losartan was reduced to 12.5 mg. His symptoms improved.  He was also seen by dermatologist and had liquid nitrogen treatment for actinic keratosis on right lower leg.  I reviewed his blood work from December of 2013. His BUN and creatinine are stable. LFTs are also normal.  He has upcoming appointment with Dr. Laverle Patter regarding bladder cancer. He is scheduled for repeat cystoscopy.  Review of Systems Negative for chest pain or shortness of breath  Past Medical History  Diagnosis Date  . History of prostate cancer 2001  . Hypertension   . Colon polyps   . Urinary incontinence     chronic urinary incontinence after prostate surgery  . Cataract   . Diverticulosis   . Phimosis   . Carotid artery stenosis 11/2008    bilateral 40-59% stenosis  . Chronic kidney disease     bladder tumor   . Hepatitis     doesn't know which type 1969    History   Social History  . Marital Status: Single    Spouse Name: N/A    Number of Children: N/A  . Years of Education: N/A   Occupational History  . Not on file.   Social History Main Topics  . Smoking status: Passive Smoke Exposure - Never Smoker    Types: Cigars  . Smokeless tobacco: Never Used     Comment: occasionally smokes a cigar  . Alcohol Use: 0.0 oz/week    6-7 Glasses of wine per week     Comment: 3-4 glasses of wine per week-no history of excessinve alcohol use  . Drug Use: No  . Sexually Active: Not on file   Other Topics  Concern  . Not on file   Social History Narrative   Patient is single, has never been married.  Does not have any children.  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   He drinks approximately 3-4 glasses of wine per week.  No history of excessive alcohol use.  Occasionally smokes a cigar.   Spends winters in [place]         Past Surgical History  Procedure Laterality Date  . Inguinal hernia repair    . Varicose vein surgery    . Other surgical history      s/p laparoscopic prostate surgery  . Skin cancer excision      a/p skin cancer right foot-non melanoma  . Bladder suspension  08/18/10    [name]  . Eye surgery  09/24/10    left eye. "Retina surgery"  . Transurethral resection of bladder tumor  10/01/2011    Procedure: TRANSURETHRAL RESECTION OF BLADDER TUMOR (TURBT);  Surgeon: Crecencio Mc, MD;  Location: [facility];  Service: Urology;  Laterality: N/A;  . Esophageal biopsy  10/01/2011    Procedure: BIOPSY;  Surgeon: Crecencio Mc, MD;  Location: [facility];  Service: Urology;;  biopsy of bladder tumor  . Ureteroscopy  01/27/2012    Procedure: URETEROSCOPY;  Surgeon: Crecencio Mc, MD;  Location: [facility];  Service: Urology;  Laterality: Left;  CYSTO, Left RETROGRADE PYELOGRPHY, LEFT URETEROSCOPY   . Cystoscopy w/ retrogrades  01/27/2012    Procedure: CYSTOSCOPY WITH RETROGRADE PYELOGRAM;  Surgeon: Crecencio Mc, MD;  Location: [facility];  Service: Urology;  Laterality: Left;    No family history on file.  Allergies  Allergen Reactions  . Lisinopril     REACTION: dizziness    Current Outpatient Prescriptions on File Prior to Visit  Medication Sig Dispense Refill  . aspirin EC 81 MG tablet Take 81 mg by mouth daily.      . methylcellulose (ARTIFICIAL TEARS) 1 % ophthalmic solution Place 1 drop into both eyes daily as needed. As needed for air plane travel      . Multiple Vitamin (MULTIVITAMIN) tablet Take 1 tablet by mouth daily with breakfast.       . vitamin C (ASCORBIC  ACID) 500 MG tablet Take 500 mg by mouth daily.       No current facility-administered medications on file prior to visit.    BP 134/68  Pulse 76  Temp(Src) 98.2 F (36.8 C) (Oral)  Wt 146 lb (66.225 kg)  BMI 21.55 kg/m2       Objective:   Physical Exam  Constitutional: He appears well-developed and well-nourished.  Cardiovascular: Normal rate, regular rhythm and normal heart sounds.   Pulmonary/Chest: Effort normal and breath sounds normal. He has no wheezes.  Skin:  Bilateral stasis dermatitis and lower extremities          Assessment & Plan:

## 2012-07-18 NOTE — Assessment & Plan Note (Signed)
Patient has followup visit with Dr. Laverle Patter for repeat cystoscopy.

## 2012-07-18 NOTE — Assessment & Plan Note (Signed)
Patient had sling procedure for urinary incontinence. He is doing very well with minimal incontinence issues

## 2012-07-19 DIAGNOSIS — C679 Malignant neoplasm of bladder, unspecified: Secondary | ICD-10-CM | POA: Diagnosis not present

## 2012-07-19 DIAGNOSIS — C669 Malignant neoplasm of unspecified ureter: Secondary | ICD-10-CM | POA: Diagnosis not present

## 2012-07-19 DIAGNOSIS — C61 Malignant neoplasm of prostate: Secondary | ICD-10-CM | POA: Diagnosis not present

## 2012-07-20 DIAGNOSIS — H35379 Puckering of macula, unspecified eye: Secondary | ICD-10-CM | POA: Diagnosis not present

## 2012-07-20 DIAGNOSIS — H04129 Dry eye syndrome of unspecified lacrimal gland: Secondary | ICD-10-CM | POA: Diagnosis not present

## 2012-07-20 DIAGNOSIS — H43819 Vitreous degeneration, unspecified eye: Secondary | ICD-10-CM | POA: Diagnosis not present

## 2012-07-20 DIAGNOSIS — H01009 Unspecified blepharitis unspecified eye, unspecified eyelid: Secondary | ICD-10-CM | POA: Diagnosis not present

## 2012-07-31 ENCOUNTER — Ambulatory Visit: Payer: Medicare Other | Admitting: Internal Medicine

## 2012-08-09 ENCOUNTER — Ambulatory Visit: Payer: Medicare Other | Admitting: Internal Medicine

## 2012-08-09 DIAGNOSIS — H3530 Unspecified macular degeneration: Secondary | ICD-10-CM | POA: Diagnosis not present

## 2012-08-09 DIAGNOSIS — Z961 Presence of intraocular lens: Secondary | ICD-10-CM | POA: Diagnosis not present

## 2012-08-09 DIAGNOSIS — H31009 Unspecified chorioretinal scars, unspecified eye: Secondary | ICD-10-CM | POA: Diagnosis not present

## 2012-08-10 ENCOUNTER — Ambulatory Visit (INDEPENDENT_AMBULATORY_CARE_PROVIDER_SITE_OTHER): Payer: Medicare Other | Admitting: Internal Medicine

## 2012-08-10 ENCOUNTER — Encounter: Payer: Self-pay | Admitting: Internal Medicine

## 2012-08-10 VITALS — BP 124/64 | HR 64 | Temp 98.3°F | Wt 143.0 lb

## 2012-08-10 DIAGNOSIS — H3530 Unspecified macular degeneration: Secondary | ICD-10-CM

## 2012-08-10 DIAGNOSIS — J309 Allergic rhinitis, unspecified: Secondary | ICD-10-CM

## 2012-08-10 DIAGNOSIS — Z8546 Personal history of malignant neoplasm of prostate: Secondary | ICD-10-CM | POA: Diagnosis not present

## 2012-08-10 HISTORY — DX: Unspecified macular degeneration: H35.30

## 2012-08-10 MED ORDER — ALPROSTADIL (VASODILATOR) 250 MCG UR PLLT: 250.0000 ug | PELLET | URETHRAL | Status: DC | PRN

## 2012-08-10 MED ORDER — MOMETASONE FUROATE 50 MCG/ACT NA SUSP: 2.0000 | Freq: Every day | NASAL | Status: DC

## 2012-08-10 MED ORDER — BISOPROLOL FUMARATE 5 MG PO TABS: 2.5000 mg | ORAL_TABLET | Freq: Every day | ORAL | Status: DC

## 2012-08-10 MED ORDER — LOSARTAN POTASSIUM 25 MG PO TABS: 25.0000 mg | ORAL_TABLET | Freq: Every day | ORAL | Status: DC

## 2012-08-10 MED ORDER — PRAVASTATIN SODIUM 20 MG PO TABS: 20.0000 mg | ORAL_TABLET | Freq: Every day | ORAL | Status: DC

## 2012-08-10 NOTE — Progress Notes (Addendum)
Subjective:    Patient ID: Jeremy Kelley, male    DOB: November 01, 1926, 79 y.o.   MRN: [id number]  HPI  79 year old white male with history of prostate cancer, bladder cancer and hypertension for followup. Since previous visit he has seen his ophthalmologist. He was diagnosed with mild macular degeneration and mild meibomian dysfunction.  His followup PSA was undetectable.  Patient complains of persistent throat irritation. He has nasal and sinus congestion. His symptoms seem to be also worse after meals. He had some improvement with using Allegra over-the-counter.   Review of Systems Intermittent cough. Negative for shortness of breath  Past Medical History  Diagnosis Date  . History of prostate cancer 2001  . Hypertension   . Colon polyps   . Urinary incontinence     chronic urinary incontinence after prostate surgery  . Cataract   . Diverticulosis   . Phimosis   . Carotid artery stenosis 11/2008    bilateral 40-59% stenosis  . Chronic kidney disease     bladder tumor   . Hepatitis     doesn't know which type 1969    History   Social History  . Marital Status: Single    Spouse Name: N/A    Number of Children: N/A  . Years of Education: N/A   Occupational History  . Not on file.   Social History Main Topics  . Smoking status: Passive Smoke Exposure - Never Smoker    Types: Cigars  . Smokeless tobacco: Never Used     Comment: occasionally smokes a cigar  . Alcohol Use: 0.0 oz/week    6-7 Glasses of wine per week     Comment: 3-4 glasses of wine per week-no history of excessinve alcohol use  . Drug Use: No  . Sexually Active: Not on file   Other Topics Concern  . Not on file   Social History Narrative   Patient is single, has never been married.  Does not have any children.  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   He drinks approximately 3-4 glasses of wine per week.  No history of excessive alcohol use.  Occasionally smokes a cigar.   Spends winters in [place]         Past Surgical History  Procedure Laterality Date  . Inguinal hernia repair    . Varicose vein surgery    . Other surgical history      s/p laparoscopic prostate surgery  . Skin cancer excision      a/p skin cancer right foot-non melanoma  . Bladder suspension  08/18/10    [name]  . Eye surgery  09/24/10    left eye. "Retina surgery"  . Transurethral resection of bladder tumor  10/01/2011    Procedure: TRANSURETHRAL RESECTION OF BLADDER TUMOR (TURBT);  Surgeon: Crecencio Mc, MD;  Location: [facility];  Service: Urology;  Laterality: N/A;  . Esophageal biopsy  10/01/2011    Procedure: BIOPSY;  Surgeon: Crecencio Mc, MD;  Location: [facility];  Service: Urology;;  biopsy of bladder tumor  . Ureteroscopy  01/27/2012    Procedure: URETEROSCOPY;  Surgeon: Crecencio Mc, MD;  Location: [facility];  Service: Urology;  Laterality: Left;  CYSTO, Left RETROGRADE PYELOGRPHY, LEFT URETEROSCOPY   . Cystoscopy w/ retrogrades  01/27/2012    Procedure: CYSTOSCOPY WITH RETROGRADE PYELOGRAM;  Surgeon: Crecencio Mc, MD;  Location: [facility];  Service: Urology;  Laterality: Left;    No family history on file.  Allergies  Allergen Reactions  .  Lisinopril     REACTION: dizziness    Current Outpatient Prescriptions on File Prior to Visit  Medication Sig Dispense Refill  . aspirin EC 81 MG tablet Take 81 mg by mouth daily.      . methylcellulose (ARTIFICIAL TEARS) 1 % ophthalmic solution Place 1 drop into both eyes daily as needed. As needed for air plane travel      . Multiple Vitamin (MULTIVITAMIN) tablet Take 1 tablet by mouth daily with breakfast.       . vitamin C (ASCORBIC ACID) 500 MG tablet Take 500 mg by mouth daily.       No current facility-administered medications on file prior to visit.    BP 124/64  Pulse 64  Temp(Src) 98.3 F (36.8 C) (Oral)  Wt 143 lb (64.864 kg)  BMI 21.11 kg/m2       Objective:   Physical Exam  Constitutional: He is oriented to person,  place, and time. He appears well-developed and well-nourished.  Cardiovascular: Normal rate, regular rhythm and normal heart sounds.   Pulmonary/Chest: Effort normal and breath sounds normal. He has no wheezes.  Neurological: He is alert and oriented to person, place, and time. No cranial nerve deficit.          Assessment & Plan:

## 2012-08-10 NOTE — Assessment & Plan Note (Signed)
Followed by urologist. Surveillance PSA undetectable.

## 2012-08-10 NOTE — Assessment & Plan Note (Signed)
Patient advised to use Allegra 180 mg once daily over-the-counter. Also use Nasonex 2 sprays each nostril once daily.

## 2012-08-10 NOTE — Assessment & Plan Note (Signed)
Patient followed by ophthalmologist. He is noted to have mild macular degeneration. He has started vitamin therapy.

## 2012-08-10 NOTE — Patient Instructions (Signed)
Use Allegra 180 mg over the counter once daily

## 2012-10-06 ENCOUNTER — Other Ambulatory Visit: Payer: Self-pay | Admitting: Urology

## 2012-11-06 ENCOUNTER — Ambulatory Visit: Payer: Medicare Other | Admitting: Internal Medicine

## 2012-11-06 ENCOUNTER — Ambulatory Visit (INDEPENDENT_AMBULATORY_CARE_PROVIDER_SITE_OTHER): Payer: Medicare Other | Admitting: Internal Medicine

## 2012-11-06 ENCOUNTER — Encounter: Payer: Self-pay | Admitting: Internal Medicine

## 2012-11-06 VITALS — BP 140/62 | HR 64 | Temp 98.1°F | Wt 142.0 lb

## 2012-11-06 DIAGNOSIS — J309 Allergic rhinitis, unspecified: Secondary | ICD-10-CM | POA: Diagnosis not present

## 2012-11-06 DIAGNOSIS — I10 Essential (primary) hypertension: Secondary | ICD-10-CM

## 2012-11-06 DIAGNOSIS — C679 Malignant neoplasm of bladder, unspecified: Secondary | ICD-10-CM

## 2012-11-06 DIAGNOSIS — L570 Actinic keratosis: Secondary | ICD-10-CM

## 2012-11-06 DIAGNOSIS — E785 Hyperlipidemia, unspecified: Secondary | ICD-10-CM | POA: Diagnosis not present

## 2012-11-06 HISTORY — DX: Actinic keratosis: L57.0

## 2012-11-06 LAB — BASIC METABOLIC PANEL
Calcium: 9.5 mg/dL (ref 8.4–10.5)
GFR: 60.18 mL/min (ref >60.00)
Potassium: 4.6 mEq/L (ref 3.5–5.1)
Sodium: 142 mEq/L (ref 135–145)

## 2012-11-06 LAB — HEPATIC FUNCTION PANEL
AST: 29 U/L (ref 0–37)
Albumin: 3.8 g/dL (ref 3.5–5.2)
Alkaline Phosphatase: 53 U/L (ref 39–117)

## 2012-11-06 LAB — LIPID PANEL
LDL Cholesterol: 77 mg/dL (ref 0–99)
VLDL: 18.2 mg/dL (ref 0.0–40.0)

## 2012-11-06 MED ORDER — ALPROSTADIL (VASODILATOR) 250 MCG UR PLLT: 250.0000 ug | PELLET | URETHRAL | Status: DC | PRN

## 2012-11-06 MED ORDER — MOMETASONE FUROATE 50 MCG/ACT NA SUSP: 2.0000 | Freq: Every day | NASAL | Status: DC

## 2012-11-06 MED ORDER — BISOPROLOL FUMARATE 5 MG PO TABS: 2.5000 mg | ORAL_TABLET | Freq: Every day | ORAL | Status: DC

## 2012-11-06 MED ORDER — LOSARTAN POTASSIUM 25 MG PO TABS: 25.0000 mg | ORAL_TABLET | Freq: Every day | ORAL | Status: DC

## 2012-11-06 MED ORDER — PRAVASTATIN SODIUM 20 MG PO TABS: 20.0000 mg | ORAL_TABLET | Freq: Every day | ORAL | Status: DC

## 2012-11-06 NOTE — Progress Notes (Signed)
Subjective:    Patient ID: Jeremy Kelley, male    DOB: [birth year], 79 y.o.   MRN: [id number]  HPI  79 year old white male with history of prostate cancer, bladder cancer and hypertension for followup. He has been living in [place] since previous visit. Patient complains of persistent intermittent nasal congestion.  His symptoms are worse in the morning. No improvement with using over-the-counter fexofenadine.  Patient scheduled to followup with his urologist regarding bladder cancer. He is scheduled for repeat cystoscopy.  Hypertension-he has occasional mild dizziness with strenuous activity.  Patient also reports seeing dermatologist while he was living in [place]. He was treated for actinic keratosis with liquid nitrogen. He requests referral to local dermatologist.   Review of Systems Negative for chest pain or unusual shortness of breath  Past Medical History  Diagnosis Date  . History of prostate cancer 2001  . Hypertension   . Colon polyps   . Urinary incontinence     chronic urinary incontinence after prostate surgery  . Cataract   . Diverticulosis   . Phimosis   . Carotid artery stenosis 11/2008    bilateral 40-59% stenosis  . Chronic kidney disease     bladder tumor   . Hepatitis     doesn't know which type 1969    History   Social History  . Marital Status: Single    Spouse Name: N/A    Number of Children: N/A  . Years of Education: N/A   Occupational History  . Not on file.   Social History Main Topics  . Smoking status: Passive Smoke Exposure - Never Smoker    Types: Cigars  . Smokeless tobacco: Never Used     Comment: occasionally smokes a cigar  . Alcohol Use: 0.0 oz/week    6-7 Glasses of wine per week     Comment: 3-4 glasses of wine per week-no history of excessinve alcohol use  . Drug Use: No  . Sexually Active: Not on file   Other Topics Concern  . Not on file   Social History Narrative   Patient is single, has never been  married.  Does not have any children.  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   He drinks approximately 3-4 glasses of wine per week.  No history of excessive alcohol use.  Occasionally smokes a cigar.   Spends winters in [place]         Past Surgical History  Procedure Laterality Date  . Inguinal hernia repair    . Varicose vein surgery    . Other surgical history      s/p laparoscopic prostate surgery  . Skin cancer excision      a/p skin cancer right foot-non melanoma  . Bladder suspension  08/18/10    [name]  . Eye surgery  09/24/10    left eye. "Retina surgery"  . Transurethral resection of bladder tumor  10/01/2011    Procedure: TRANSURETHRAL RESECTION OF BLADDER TUMOR (TURBT);  Surgeon: Crecencio Mc, MD;  Location: [facility];  Service: Urology;  Laterality: N/A;  . Esophageal biopsy  10/01/2011    Procedure: BIOPSY;  Surgeon: Crecencio Mc, MD;  Location: [facility];  Service: Urology;;  biopsy of bladder tumor  . Ureteroscopy  01/27/2012    Procedure: URETEROSCOPY;  Surgeon: Crecencio Mc, MD;  Location: [facility];  Service: Urology;  Laterality: Left;  CYSTO, Left RETROGRADE PYELOGRPHY, LEFT URETEROSCOPY   . Cystoscopy w/ retrogrades  01/27/2012    Procedure: CYSTOSCOPY  WITH RETROGRADE PYELOGRAM;  Surgeon: Crecencio Mc, MD;  Location: [facility];  Service: Urology;  Laterality: Left;    No family history on file.  Allergies  Allergen Reactions  . Lisinopril     REACTION: dizziness    Current Outpatient Prescriptions on File Prior to Visit  Medication Sig Dispense Refill  . aspirin EC 81 MG tablet Take 81 mg by mouth daily.      . methylcellulose (ARTIFICIAL TEARS) 1 % ophthalmic solution Place 1 drop into both eyes daily as needed. As needed for air plane travel      . Multiple Vitamin (MULTIVITAMIN) tablet Take 1 tablet by mouth daily with breakfast.       . vitamin C (ASCORBIC ACID) 500 MG tablet Take 500 mg by mouth daily.       No current facility-administered  medications on file prior to visit.    BP 140/62  Pulse 64  Temp(Src) 98.1 F (36.7 C) (Oral)  Wt 142 lb (64.411 kg)  BMI 20.96 kg/m2       Objective:   Physical Exam  Constitutional: He is oriented to person, place, and time. He appears well-developed and well-nourished.  Cardiovascular: Normal rate, regular rhythm and normal heart sounds.   No murmur heard. Pulmonary/Chest: Effort normal and breath sounds normal. He has no wheezes.  Neurological: He is alert and oriented to person, place, and time. No cranial nerve deficit.  Skin: Skin is warm and dry.  6 mm scaly area top of right hand  Psychiatric: He has a normal mood and affect. His behavior is normal.          Assessment & Plan:

## 2012-11-06 NOTE — Patient Instructions (Addendum)
Your intermittent dizziness is likely due to your beta blocker - bisoprolol.  Please contact our office if your symptoms worsen. Our office will contact you re: dermatology referral and blood test results.

## 2012-11-06 NOTE — Assessment & Plan Note (Signed)
Patient has small scaly lesions top of right hand. He requests referral to local dermatologist for treatment and also general skin screening. Arrange referral to Dr. Terri Piedra.

## 2012-11-06 NOTE — Assessment & Plan Note (Signed)
Patient has upcoming repeat cystoscopy with Dr. Laverle Patter.

## 2012-11-06 NOTE — Assessment & Plan Note (Addendum)
Blood pressure is well controlled. His mild exertional dizziness likely secondary to beta blocker. He understands to contact our office should his symptoms worsen.  Consider switch to metoprolol.  Monitor electrolytes and kidney function while on losartan.

## 2012-11-06 NOTE — Assessment & Plan Note (Signed)
Monitor FLP and LFTs. 

## 2012-11-06 NOTE — Assessment & Plan Note (Signed)
Restart intranasal steroids. Patient likely has dust mite allergy. Obtain postural allergy panel.  Patient's symptoms persistent over-the-counter fexofenadine.

## 2012-11-07 LAB — ~~LOC~~ ALLERGY PANEL
Allergen, Cedar tree, t12: <0.1 kU/L
Allergen, D pternoyssinus,d7: <0.1 kU/L
Allergen, Mulberry, t76: <0.1 kU/L
Alternaria Alternata: <0.1 kU/L
Aspergillus fumigatus, m3: <0.1 kU/L
Cat Dander: <0.1 kU/L
Cladosporium Herbarum: <0.1 kU/L
D. farinae: <0.1 kU/L
Johnson Grass: <0.1 kU/L
Mucor Racemosus: <0.1 kU/L
Mugwort: <0.1 kU/L
Nettle: <0.1 kU/L
Pecan/Hickory Tree IgE: <0.1 kU/L
Penicillium Notatum: <0.1 kU/L
Sweet Gum: <0.1 kU/L

## 2012-11-10 DIAGNOSIS — N529 Male erectile dysfunction, unspecified: Secondary | ICD-10-CM | POA: Diagnosis not present

## 2012-11-10 DIAGNOSIS — C679 Malignant neoplasm of bladder, unspecified: Secondary | ICD-10-CM | POA: Diagnosis not present

## 2012-11-14 ENCOUNTER — Encounter (HOSPITAL_COMMUNITY): Payer: Self-pay | Admitting: Pharmacy Technician

## 2012-11-20 ENCOUNTER — Encounter (HOSPITAL_COMMUNITY)
Admission: RE | Admit: 2012-11-20 | Discharge: 2012-11-20 | Disposition: A | Discharge status: Home / Self Care | Payer: Medicare Other | Source: Ambulatory Visit | Attending: Urology | Admitting: Urology

## 2012-11-20 ENCOUNTER — Other Ambulatory Visit (HOSPITAL_COMMUNITY): Payer: Self-pay | Admitting: *Deleted

## 2012-11-20 ENCOUNTER — Ambulatory Visit (HOSPITAL_COMMUNITY)
Admission: RE | Admit: 2012-11-20 | Discharge: 2012-11-20 | Disposition: A | Discharge status: Home / Self Care | Payer: Medicare Other | Source: Ambulatory Visit | Attending: Urology | Admitting: Urology

## 2012-11-20 ENCOUNTER — Encounter (HOSPITAL_COMMUNITY): Payer: Self-pay

## 2012-11-20 DIAGNOSIS — I517 Cardiomegaly: Secondary | ICD-10-CM | POA: Diagnosis not present

## 2012-11-20 DIAGNOSIS — R9431 Abnormal electrocardiogram [ECG] [EKG]: Secondary | ICD-10-CM | POA: Diagnosis not present

## 2012-11-20 DIAGNOSIS — Z01812 Encounter for preprocedural laboratory examination: Secondary | ICD-10-CM | POA: Diagnosis not present

## 2012-11-20 DIAGNOSIS — C679 Malignant neoplasm of bladder, unspecified: Secondary | ICD-10-CM | POA: Insufficient documentation

## 2012-11-20 DIAGNOSIS — Z01818 Encounter for other preprocedural examination: Secondary | ICD-10-CM | POA: Insufficient documentation

## 2012-11-20 HISTORY — DX: Hyperlipidemia, unspecified: E78.5

## 2012-11-20 HISTORY — DX: Malignant neoplasm of left ureter: C66.2

## 2012-11-20 HISTORY — DX: Anesthesia of skin: R20.0

## 2012-11-20 LAB — CBC
MCHC: 33.2 g/dL (ref 30.0–36.0)
Platelets: 191 10*3/uL (ref 150–400)
RDW: 13 % (ref 11.5–15.5)

## 2012-11-20 NOTE — Patient Instructions (Signed)
ANTWIAN SANTAANA  11/20/2012                           YOUR PROCEDURE IS SCHEDULED ON: 11/24/12               PLEASE REPORT TO SHORT STAY CENTER AT : 10:00 AM               CALL THIS NUMBER IF ANY PROBLEMS THE DAY OF SURGERY :               [phone]                      REMEMBER:   Do not eat food or drink liquids AFTER MIDNIGHT   May have clear liquids UNTIL 6 HOURS BEFORE SURGERY  (6:30 AM)  Clear liquids include soda, tea, black coffee, apple or grape juice, broth.  Take these medicines the morning of surgery with A SIP OF WATER: NONE   Do not wear jewelry, make-up   Do not wear lotions, powders, or perfumes.   Do not shave legs or underarms 12 hrs. before surgery (men may shave face)  Do not bring valuables to the hospital.  Contacts, dentures or bridgework may not be worn into surgery.  Leave suitcase in the car. After surgery it may be brought to your room.  For patients admitted to the hospital more than one night, checkout time is 11:00                          The day of discharge.   Patients discharged the day of surgery will not be allowed to drive home                             If going home same day of surgery, must have someone stay with you first                           24 hrs at home and arrange for some one to drive you home from hospital.    Special Instructions:   Please read over the following fact sheets that you were given:                        2. [place] PREPARING FOR SURGERY SHEET                                                X_____________________________________________________________________        Failure to follow these instructions may result in cancellation of your surgery

## 2012-11-24 ENCOUNTER — Encounter (HOSPITAL_COMMUNITY)
Admission: RE | Disposition: A | Discharge status: Home / Self Care | Payer: Self-pay | Source: Ambulatory Visit | Attending: Urology

## 2012-11-24 ENCOUNTER — Encounter (HOSPITAL_COMMUNITY): Payer: Self-pay

## 2012-11-24 ENCOUNTER — Ambulatory Visit (HOSPITAL_COMMUNITY): Payer: Medicare Other | Admitting: Anesthesiology

## 2012-11-24 ENCOUNTER — Encounter (HOSPITAL_COMMUNITY): Payer: Self-pay | Admitting: Anesthesiology

## 2012-11-24 ENCOUNTER — Observation Stay (HOSPITAL_COMMUNITY)
Admission: RE | Admit: 2012-11-24 | Discharge: 2012-11-25 | Disposition: A | Discharge status: Home / Self Care | Payer: Medicare Other | Source: Ambulatory Visit | Attending: Urology | Admitting: Urology

## 2012-11-24 DIAGNOSIS — Z79899 Other long term (current) drug therapy: Secondary | ICD-10-CM | POA: Diagnosis not present

## 2012-11-24 DIAGNOSIS — D414 Neoplasm of uncertain behavior of bladder: Secondary | ICD-10-CM | POA: Diagnosis not present

## 2012-11-24 DIAGNOSIS — N476 Balanoposthitis: Secondary | ICD-10-CM | POA: Insufficient documentation

## 2012-11-24 DIAGNOSIS — C61 Malignant neoplasm of prostate: Secondary | ICD-10-CM | POA: Diagnosis not present

## 2012-11-24 DIAGNOSIS — R339 Retention of urine, unspecified: Secondary | ICD-10-CM | POA: Insufficient documentation

## 2012-11-24 DIAGNOSIS — C676 Malignant neoplasm of ureteric orifice: Secondary | ICD-10-CM | POA: Diagnosis not present

## 2012-11-24 DIAGNOSIS — C679 Malignant neoplasm of bladder, unspecified: Principal | ICD-10-CM | POA: Insufficient documentation

## 2012-11-24 DIAGNOSIS — I10 Essential (primary) hypertension: Secondary | ICD-10-CM | POA: Diagnosis not present

## 2012-11-24 DIAGNOSIS — N3946 Mixed incontinence: Secondary | ICD-10-CM | POA: Diagnosis not present

## 2012-11-24 DIAGNOSIS — D126 Benign neoplasm of colon, unspecified: Secondary | ICD-10-CM | POA: Diagnosis not present

## 2012-11-24 DIAGNOSIS — Q638 Other specified congenital malformations of kidney: Secondary | ICD-10-CM | POA: Insufficient documentation

## 2012-11-24 DIAGNOSIS — Z8551 Personal history of malignant neoplasm of bladder: Secondary | ICD-10-CM | POA: Diagnosis not present

## 2012-11-24 HISTORY — PX: CYSTOSCOPY WITH RETROGRADE PYELOGRAM, URETEROSCOPY AND STENT PLACEMENT: SHX5789

## 2012-11-24 SURGERY — CYSTOURETEROSCOPY, WITH RETROGRADE PYELOGRAM AND STENT INSERTION
Anesthesia: General | Laterality: Left | Wound class: Clean Contaminated

## 2012-11-24 MED ORDER — SODIUM CHLORIDE 0.9 % IJ SOLN: 3.0000 mL | INTRAMUSCULAR | Status: DC | PRN

## 2012-11-24 MED ORDER — GLYCOPYRROLATE 0.2 MG/ML IJ SOLN
INTRAMUSCULAR | Status: DC | PRN
  Administered 2012-11-24: 0.3 mg via INTRAVENOUS

## 2012-11-24 MED ORDER — LOSARTAN POTASSIUM 25 MG PO TABS
25.0000 mg | ORAL_TABLET | Freq: Every day | ORAL | Status: DC
  Administered 2012-11-24: 25 mg via ORAL
  Filled 2012-11-24 (×2): qty 1

## 2012-11-24 MED ORDER — SIMVASTATIN 10 MG PO TABS
10.0000 mg | ORAL_TABLET | Freq: Every day | ORAL | Status: DC
  Administered 2012-11-24: 10 mg via ORAL
  Filled 2012-11-24 (×2): qty 1

## 2012-11-24 MED ORDER — PROPOFOL 10 MG/ML IV BOLUS
INTRAVENOUS | Status: DC | PRN
  Administered 2012-11-24: 140 mg via INTRAVENOUS

## 2012-11-24 MED ORDER — HYDROMORPHONE HCL PF 1 MG/ML IJ SOLN
INTRAMUSCULAR | Status: AC
  Filled 2012-11-24: qty 1

## 2012-11-24 MED ORDER — CIPROFLOXACIN IN D5W 400 MG/200ML IV SOLN
INTRAVENOUS | Status: AC
  Filled 2012-11-24: qty 200

## 2012-11-24 MED ORDER — SODIUM CHLORIDE 0.9 % IJ SOLN
3.0000 mL | Freq: Two times a day (BID) | INTRAMUSCULAR | Status: DC
Start: 2012-11-24 — End: 2012-11-24

## 2012-11-24 MED ORDER — HYDROCODONE-ACETAMINOPHEN 5-325 MG PO TABS
1.0000 | ORAL_TABLET | ORAL | Status: DC | PRN
Start: 2012-11-24 — End: 2012-11-25

## 2012-11-24 MED ORDER — IOHEXOL 300 MG/ML  SOLN
INTRAMUSCULAR | Status: AC
  Filled 2012-11-24: qty 1

## 2012-11-24 MED ORDER — SODIUM CHLORIDE 0.9 % IR SOLN
Status: DC | PRN
  Administered 2012-11-24: 3000 mL via INTRAVESICAL

## 2012-11-24 MED ORDER — OXYCODONE HCL 5 MG/5ML PO SOLN
5.0000 mg | Freq: Once | ORAL | Status: DC | PRN
  Filled 2012-11-24: qty 5

## 2012-11-24 MED ORDER — IOHEXOL 300 MG/ML  SOLN
INTRAMUSCULAR | Status: DC | PRN
  Administered 2012-11-24: 8 mL via URETHRAL

## 2012-11-24 MED ORDER — MEPERIDINE HCL 50 MG/ML IJ SOLN: 6.2500 mg | INTRAMUSCULAR | Status: DC | PRN

## 2012-11-24 MED ORDER — LACTATED RINGERS IV SOLN
INTRAVENOUS | Status: DC
  Administered 2012-11-24: 1000 mL via INTRAVENOUS

## 2012-11-24 MED ORDER — ATROPINE SULFATE 0.4 MG/ML IJ SOLN
INTRAMUSCULAR | Status: DC | PRN
  Administered 2012-11-24: 0.4 mg via INTRAVENOUS

## 2012-11-24 MED ORDER — CIPROFLOXACIN IN D5W 400 MG/200ML IV SOLN
400.0000 mg | INTRAVENOUS | Status: AC
  Administered 2012-11-24: 400 mg via INTRAVENOUS

## 2012-11-24 MED ORDER — HYDROMORPHONE HCL PF 1 MG/ML IJ SOLN: 0.2500 mg | INTRAMUSCULAR | Status: DC | PRN

## 2012-11-24 MED ORDER — PROMETHAZINE HCL 25 MG/ML IJ SOLN: 6.2500 mg | INTRAMUSCULAR | Status: DC | PRN

## 2012-11-24 MED ORDER — FENTANYL CITRATE 0.05 MG/ML IJ SOLN
INTRAMUSCULAR | Status: DC | PRN
  Administered 2012-11-24: 50 ug via INTRAVENOUS

## 2012-11-24 MED ORDER — BISOPROLOL FUMARATE 5 MG PO TABS
2.5000 mg | ORAL_TABLET | Freq: Every day | ORAL | Status: DC
  Administered 2012-11-24: 2.5 mg via ORAL
  Filled 2012-11-24 (×2): qty 0.5

## 2012-11-24 MED ORDER — OXYCODONE HCL 5 MG PO TABS: 5.0000 mg | ORAL_TABLET | Freq: Once | ORAL | Status: DC | PRN

## 2012-11-24 MED ORDER — SODIUM CHLORIDE 0.9 % IV SOLN: 250.0000 mL | INTRAVENOUS | Status: DC | PRN

## 2012-11-24 SURGICAL SUPPLY — 11 items
BAG URO CATCHER STRL LF (DRAPE) ×2 IMPLANT
CATH INTERMIT  6FR 70CM (CATHETERS) ×1 IMPLANT
CLOTH BEACON ORANGE TIMEOUT ST (SAFETY) ×2 IMPLANT
DRAPE CAMERA CLOSED 9X96 (DRAPES) ×2 IMPLANT
GLOVE BIOGEL M STRL SZ7.5 (GLOVE) ×4 IMPLANT
GOWN STRL NON-REIN LRG LVL3 (GOWN DISPOSABLE) ×2 IMPLANT
GUIDEWIRE STR DUAL SENSOR (WIRE) ×3 IMPLANT
MANIFOLD NEPTUNE II (INSTRUMENTS) ×2 IMPLANT
NS IRRIG 1000ML POUR BTL (IV SOLUTION) IMPLANT
PACK CYSTO (CUSTOM PROCEDURE TRAY) ×2 IMPLANT
TUBING CONNECTING 10 (TUBING) ×2 IMPLANT

## 2012-11-24 NOTE — Op Note (Signed)
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 Cystoscopy 2. Left ureteroscopy 3. Left retrograde pyelography with interpretation  Surgeon: Moody Bruins. M.D.  Anesthesia: General  Complications: None  Intraoperative findings: Left retrograde pyelography demonstrated a normal caliber ureter without obvious filling defects.  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  EBL: Minimal  Indication: Jeremy Kelley is a 79 y.o. 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 After reviewing the management options for treatment, the patient elected to proceed with the above surgical procedure(s). We have discussed the potential benefits and risks of the procedure, side effects of the proposed treatment, the likelihood of the patient achieving the goals of the procedure, and any potential problems that might occur during the procedure or recuperation. Informed consent has been obtained.  Description of procedure:  The patient was taken to the operating room and general anesthesia was induced.  The patient was placed in the dorsal lithotomy position, prepped and draped in the usual sterile fashion, and preoperative antibiotics were administered. A preoperative time-out was performed.   Cystourethroscopy was performed.  The patient's urethra was examined and was normal.  His prostate was surgically absent. The bladder was then systematically examined in its entirety. There was no evidence for any bladder tumors, stones, or other mucosal pathology.    Attention then turned to the left ureteral orifice and a ureteral catheter was used to intubate the ureteral orifice.  Omnipaque contrast was injected through the ureteral catheter and a retrograde pyelogram was  performed with findings as dictated above.  Since he had a prior distal left ureteral tumor, I did wish to endoscopically examine the left ureter distally to ensure no evidence for recurrence. A 0.38 sensor guidewire was then advanced up the left ureter into the renal pelvis under fluoroscopic guidance. The 6 Fr semirigid ureteroscope was then advanced into the ureter next to the guidewire up to the level of the iliac vessels with no tumor or other abnormality identified. The scope was withdrawn and the wire was removed.  The bladder was then emptied and the procedure ended.  The patient appeared to tolerate the procedure well and without complications.  The patient was able to be awakened and transferred to the recovery unit in satisfactory condition.

## 2012-11-24 NOTE — Care Management Note (Signed)
    Page 1 of 1   11/24/2012     3:05:02 PM   CARE MANAGEMENT NOTE 11/24/2012  Patient:  Jeremy Kelley, Jeremy Kelley   Account Number:  [id number]  Date Initiated:  11/24/2012  Documentation initiated by:  Lanier Clam  Subjective/Objective Assessment:   ADMITTED W/UROTHELIAL CA BLADDER.     Action/Plan:   FROM HOME W/SPOUSE.   Anticipated DC Date:  11/25/2012   Anticipated DC Plan:  HOME/SELF CARE      DC Planning Services  CM consult      Choice offered to / List presented to:             Status of service:  Completed, signed off Medicare Important Message given?   (If response is "NO", the following Medicare IM given date fields will be blank) Date Medicare IM given:   Date Additional Medicare IM given:    Discharge Disposition:  HOME/SELF CARE  Per UR Regulation:  Reviewed for med. necessity/level of care/duration of stay  If discussed at Long Length of Stay Meetings, dates discussed:    Comments:  11/24/12 Zaedyn Covin RN,BSN NCM [phone] S/P CYSTOSCOPY.

## 2012-11-24 NOTE — Preoperative (Signed)
Beta Blockers   Reason not to administer Beta Blockers:Not Applicable 

## 2012-11-24 NOTE — Progress Notes (Signed)
Patient ID: Jeremy Kelley, male   DOB: 04-04-1927, 79 y.o.   MRN: [id number]  Pt doing well.  Catheter was placed earlier tonight when he developed an inability to void.  Abd soft Urine light pink   Plan: Voiding trial in am

## 2012-11-24 NOTE — Transfer of Care (Signed)
Immediate Anesthesia Transfer of Care Note  Patient: Jeremy Kelley  Procedure(s) Performed: Procedure(s): CYSTOSCOPY WITH left RETROGRADE PYELOGRAM,  (Left)  Patient Location: PACU  Anesthesia Type:General  Level of Consciousness: awake, sedated and patient cooperative  Airway & Oxygen Therapy: Patient Spontanous Breathing and Patient connected to face mask oxygen  Post-op Assessment: Report given to PACU RN and Post -op Vital signs reviewed and stable  Post vital signs: Reviewed and stable  Complications: No apparent anesthesia complications

## 2012-11-24 NOTE — Anesthesia Preprocedure Evaluation (Addendum)
Anesthesia Evaluation  Patient identified by MRN, date of birth, ID band Patient awake    Reviewed: Allergy & Precautions, H&P , NPO status , Patient's Chart, lab work & pertinent test results  Airway Mallampati: II TM Distance: >3 FB Neck ROM: Full    Dental no notable dental hx. (+) Dental Advisory Given and Edentulous Upper   Pulmonary neg pulmonary ROS,  breath sounds clear to auscultation  Pulmonary exam normal       Cardiovascular hypertension, Pt. on medications and Pt. on home beta blockers - Peripheral Vascular Disease + dysrhythmias + Valvular Problems/Murmurs Rhythm:Regular Rate:Normal     Neuro/Psych Carotid artery stenosis negative psych ROS   GI/Hepatic negative GI ROS, (+) Hepatitis -, AH/O Hep A in 1960's   Endo/Other  negative endocrine ROS  Renal/GU Renal diseasenegative Renal ROS     Musculoskeletal negative musculoskeletal ROS (+)   Abdominal   Peds  Hematology negative hematology ROS (+)   Anesthesia Other Findings   Reproductive/Obstetrics                          Anesthesia Physical  Anesthesia Plan  ASA: III  Anesthesia Plan: General   Post-op Pain Management:    Induction: Intravenous  Airway Management Planned: LMA  Additional Equipment:   Intra-op Plan:   Post-operative Plan: Extubation in OR  Informed Consent: I have reviewed the patients History and Physical, chart, labs and discussed the procedure including the risks, benefits and alternatives for the proposed anesthesia with the patient or authorized representative who has indicated his/her understanding and acceptance.   Dental advisory given  Plan Discussed with: CRNA  Anesthesia Plan Comments:         Anesthesia Quick Evaluation

## 2012-11-24 NOTE — Interval H&P Note (Signed)
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 After consideration of risks, benefits and other options for treatment, the patient has consented to  Procedure(s) with comments: CYSTOSCOPY WITH RETROGRADE PYELOGRAM, URETEROSCOPY AND STENT PLACEMENT (N/A) - BILATERAL RETROGARDE PYELOGRAPHY, POSS LEFT URETERAL STENT  as a surgical intervention .  The patient's history has been reviewed, patient examined, no change in status, stable for surgery.  I have reviewed the patient's chart and labs.  Questions were answered to the patient's satisfaction.     Masiyah Engen,LES

## 2012-11-24 NOTE — Anesthesia Postprocedure Evaluation (Signed)
Anesthesia Post Note  Patient: Jeremy Kelley  Procedure(s) Performed: Procedure(s) (LRB): CYSTOSCOPY WITH left RETROGRADE PYELOGRAM, bladder washings, ureteroscopy (Left)  Anesthesia type: General  Patient location: PACU  Post pain: Pain level controlled  Post assessment: Post-op Vital signs reviewed  Last Vitals: BP 140/71  Pulse 77  Temp(Src) 36.3 C (Oral)  Resp 15  SpO2 100%  Post vital signs: Reviewed  Level of consciousness: sedated  Complications: No apparent anesthesia complications

## 2012-11-24 NOTE — H&P (Signed)
History of Present Illness  [name] is an 79 year old gentleman with the following urologic history:  1) Prostate cancer: He is s/p a laparoscopic radical prostatectomy by Dr. Osvaldo Kelley in [place] in August 2001. His PSA has thus far been undetectable since treatment.  2) Incontinence: He has mixed urge and stress incontinence which is very mild. He uses oxybutynin 15 mg for treatment which has benefited him although he typically wore one pad per day. He is s/p a male sling by Dr. Sherron Kelley in May 2012.     3) Erectile dysfunction: He was not responsive to PDE-5 inhibitors.  He uses MUSE 125 mcg prn with good success.  4) Urothelial carcinoma: He underwent a TUR for a bladder polyp in 2004. He was found to have bladder tumors by Dr. Sherron Kelley on cystoscopy in 2013 and underwent TURBT by me in June 2013 which demonstrated multifocal low grade, Ta urothelial carcinoma of the bladder and a low grade left ureteral urothelial carcinoma treated with laser ablation.  June 2013: TURBT (3 cm right bladder tumor) - Low grade Ta, Laser ablation of low grade urothelial carcinoma of the left ureter Oct 2013: Surveillance ureteroscopy and cystoscopy - no recurrence, cytology with slight atypia  5) Balanoposthitis: He has used Lotrisone cream and vaseline prn.  Interval history:  He follows up today for further evaluation of his urothelial carcinoma of the left ureter and bladder.  He denies any hematuria or change in his voiding habits except some slight increased urinary frequency since his last visit.  He continues to remain in stable health and specifically has not developed any chest pain, shortness of breath, or palpitations symptoms.     Past Medical History Problems  1. History of  Prostate Cancer V10.46  Surgical History Problems  1. History of  Cystoscopy With Fulguration Medium Lesion (2-5cm) 2. History of  Cystoscopy With Insertion Of Ureteral Stent Left 3. History of  Cystoscopy With  Resection Of Tumor 4. History of  Cystoscopy With Ureteroscopy Left 5. History of  Hernia Repair 6. History of  Operation For Correction Of Male Urinary Incontinence 7. History of  Prostatect Retropubic Radical W/ Nerve Sparing Laparoscopic 8. History of  Varicose Vein Ligation  Current Meds 1. Adult Aspirin Low Strength 81 MG Oral Tablet Dispersible; Therapy: (Recorded:17Jun2009) to 2. Amlodipine Besy-Benazepril HCl 5-10 MG Oral Capsule; Therapy: (Recorded:17Jun2009) to 3. Bisoprolol Fumarate 5 MG Oral Tablet; Therapy: 19Oct2011 to 4. BL Vitamin C 500 MG TABS; Therapy: (Recorded:17Jun2009) to 5. Clotrimazole-Betamethasone 1-0.05 % External Cream; Therapy: (Recorded:31Jul2008) to 6. Cymbalta 30 MG Oral Capsule Delayed Release Particles; Therapy: (Recorded:05Mar2012) to 7. Daily Multiple Vitamins TABS; Therapy: (Recorded:17Jun2009) to 8. Losartan Potassium 25 MG Oral Tablet; Therapy: 14Oct2013 to 9. MetroNIDAZOLE 500 MG Oral Tablet; Therapy: 19Jun2013 to 10. Muse 125 MCG Urethral Pellet; USE 1 PELLET AS NEEDED ASDIRECTED; Therapy: 18Jun2010   to (Evaluate:30Aug2012); Last Rx:13Jun2012 11. Polyethylene Glycol 3350 Oral Powder; Therapy: 25Jun2013 to 12. Pravastatin Sodium 20 MG Oral Tablet; Therapy: 19Oct2011 to 13. Triamcinolone Acetonide 0.1 % External Cream; Therapy: 20Aug2013 to  Allergies Medication  1. No Known Drug Allergies  Family History Denied  1. Family history of  Family Health Status Number Of Children  Social History Problems  1. Alcohol Use drinks wine 2. Caffeine Use daily 3. Family history of  Death In The Family Father age 67; ? 4. Family history of  Death In The Family Mother age 80;? 5. Marital History - Single 6. Never A Smoker 7. Occupation: retired Nurse, children's  8. Tobacco Use  Vitals Vital Signs [Data Includes: Last 1 Day]  01Aug2014 12:12PM  Blood Pressure: 155 / 66 Heart Rate: 51  Physical Exam Constitutional: Well nourished and well developed  . No acute distress.  ENT:. The ears and nose are normal in appearance.  Neck: The appearance of the neck is normal and no neck mass is present.  Pulmonary: No respiratory distress and normal respiratory rhythm and effort.  Cardiovascular: Heart rate and rhythm are normal . No peripheral edema.    Results/Data Urine [Data Includes: Last 1 Day]   01Aug2014  COLOR YELLOW   APPEARANCE CLEAR   SPECIFIC GRAVITY 1.020   pH 6.0   GLUCOSE NEG mg/dL  BILIRUBIN NEG   KETONE NEG mg/dL  BLOOD NEG   PROTEIN NEG mg/dL  UROBILINOGEN 0.2 mg/dL  NITRITE NEG   LEUKOCYTE ESTERASE NEG    Plan Bladder Cancer (188.9)  1. Follow-up Schedule Surgery Office  Follow-up  Requested for: 01Aug2014 Health Maintenance (V70.0)  2. UA With REFLEX  Done: 01Aug2014 11:46AM  Discussion/Summary  1.  Urothelial carcinoma of the left ureter and bladder: He is scheduled for surveillance cystoscopy and ureteroscopy and we'll plan to proceed as scheduled on August 15.  We reviewed the potential risks, complications, and alternative options associated with this procedure and he gives his informed consent to proceed.  As before, he will be observed overnight considering that he does not have family to help with his medical care.  Further surveillance and follow-up will be scheduled depending on what is found at the time of his surgery.  2.  Prostate cancer: He will be due for his next surveillance PSA in the spring of 2015.  3.  Erectile dysfunction: He will continue to use MUSE 125 g when necessary.  Cc: Dr. Thomos Kelley     Signatures Electronically signed by : Jeremy Kelley, M.D.; Nov 10 2012 12:46PM

## 2012-11-25 NOTE — Discharge Summary (Signed)
  Date of admission: 11/24/2012  Date of discharge: 11/25/2012  Admission diagnosis: History of urothelial carcinoma of the bladder and left ureter  Discharge diagnosis: Same  Secondary diagnoses: None  History and Physical: For full details, please see admission history and physical. Briefly, Jeremy Kelley is a 79 y.o. 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 He presented for endoscopic surveillance.   Hospital Course: He was taken to the OR on 11/24/12 and underwent cystoscopy, bladder washing for cytology and left ureteroscopy.  Findings did not suggest recurrent malignancy.  Due to the fact that he lives at home alone and considering his advanced age, he was monitored overnight for observation.  He did develop postoperative urinary retention and a catheter was placed.  He underwent a voiding trial the following morning and was discharged home.  Disposition: Home  Discharge instruction: He may resume activity and his normal diet.  Discharge medications:    Medication List         alprostadil 250 MCG pellet  Commonly known as:  MUSE  1 each (250 mcg total) by Transurethral route as needed for erectile dysfunction. use no more than 3 times per week     aspirin EC 81 MG tablet  Take 81 mg by mouth daily.     B-complex with vitamin C tablet  Take 1 tablet by mouth daily.     bisoprolol 5 MG tablet  Commonly known as:  ZEBETA  Take 0.5 tablets (2.5 mg total) by mouth at bedtime. 1/2 tablet by mouth once a day     fish oil-omega-3 fatty acids 1000 MG capsule  Take 1 g by mouth daily.     folic acid 400 MCG tablet  Commonly known as:  FOLVITE  Take 400 mcg by mouth once a week.     losartan 25 MG tablet  Commonly known as:  COZAAR  Take 25 mg by mouth at bedtime.     methylcellulose 1 % ophthalmic solution  Commonly known as:  ARTIFICIAL TEARS  Place 1 drop into both eyes 2 (two) times daily.     mometasone 50 MCG/ACT  nasal spray  Commonly known as:  NASONEX  Place 2 sprays into the nose daily.     multivitamin tablet  Take 1 tablet by mouth daily with breakfast.     pravastatin 20 MG tablet  Commonly known as:  PRAVACHOL  Take 20 mg by mouth at bedtime.     PRESERVISION/LUTEIN PO  Take 1 tablet by mouth daily.     vitamin C 500 MG tablet  Commonly known as:  ASCORBIC ACID  Take 500 mg by mouth daily.        Followup:      Follow-up Information   Follow up with Crecencio Mc, MD. (Office will call to schedule)    Specialty:  Urology   Contact information:   [address] [place] [address] [phone]

## 2012-11-25 NOTE — Progress Notes (Signed)
The foley catheter was removed at 0600. Output at this time was 725 ml. Patient tolerated this procedure well. Patient is anxious to have a shower and then return home. Will continue to monitor the patient.

## 2012-11-25 NOTE — Progress Notes (Signed)
Patient ID: Jeremy Kelley, male   DOB: 25-Apr-1926, 79 y.o.   MRN: [id number]  1 Day Post-Op Subjective: Pt doing well.  Catheter removed this morning. Has not voided yet.  Objective: Vital signs in last 24 hours: Temp:  [97.3 F (36.3 C)-97.8 F (36.6 C)] 97.8 F (36.6 C) (08/15 2145) Pulse Rate:  [52-77] 52 (08/15 2145) Resp:  [13-22] 16 (08/15 2145) BP: (117-175)/(60-84) 144/60 mmHg (08/15 2145) SpO2:  [98 %-100 %] 100 % (08/15 2145) Weight:  [65.772 kg (145 lb)] 65.772 kg (145 lb) (08/15 1527)  Intake/Output from previous day: 08/15 0701 - 08/16 0700 In: 1700 [P.O.:560; I.V.:1140] Out: 1625 [Urine:1625] Intake/Output this shift:    Physical Exam:  General: Alert and oriented Abdomen: Soft, ND   Lab Results: No results found for this basename: HGB, HCT,  in the last 72 hours BMET No results found for this basename: NA, K, CL, CO2, GLUCOSE, BUN, CREATININE, CALCIUM,  in the last 72 hours   Studies/Results: No results found.  Assessment/Plan: - Voiding trial  - D/C home   LOS: 1 day   Romayne Ticas,LES 11/25/2012, 8:26 AM

## 2012-11-27 ENCOUNTER — Encounter (HOSPITAL_COMMUNITY): Payer: Self-pay | Admitting: Urology

## 2012-12-04 DIAGNOSIS — B353 Tinea pedis: Secondary | ICD-10-CM | POA: Diagnosis not present

## 2012-12-04 DIAGNOSIS — L570 Actinic keratosis: Secondary | ICD-10-CM | POA: Diagnosis not present

## 2013-08-03 ENCOUNTER — Ambulatory Visit (INDEPENDENT_AMBULATORY_CARE_PROVIDER_SITE_OTHER): Payer: Medicare Other | Admitting: Internal Medicine

## 2013-08-03 ENCOUNTER — Encounter: Payer: Self-pay | Admitting: Internal Medicine

## 2013-08-03 VITALS — BP 144/82 | HR 60 | Temp 98.3°F | Ht 70.0 in | Wt 143.0 lb

## 2013-08-03 DIAGNOSIS — E785 Hyperlipidemia, unspecified: Secondary | ICD-10-CM | POA: Diagnosis not present

## 2013-08-03 DIAGNOSIS — J3489 Other specified disorders of nose and nasal sinuses: Secondary | ICD-10-CM | POA: Diagnosis not present

## 2013-08-03 DIAGNOSIS — I10 Essential (primary) hypertension: Secondary | ICD-10-CM

## 2013-08-03 DIAGNOSIS — C679 Malignant neoplasm of bladder, unspecified: Secondary | ICD-10-CM

## 2013-08-03 MED ORDER — BISOPROLOL FUMARATE 5 MG PO TABS: 2.5000 mg | ORAL_TABLET | Freq: Every day | ORAL | Status: DC

## 2013-08-03 MED ORDER — IPRATROPIUM BROMIDE 0.06 % NA SOLN: 2.0000 | Freq: Three times a day (TID) | NASAL | Status: DC | PRN

## 2013-08-03 MED ORDER — LOSARTAN POTASSIUM 25 MG PO TABS: 25.0000 mg | ORAL_TABLET | Freq: Every day | ORAL | Status: DC

## 2013-08-03 MED ORDER — PRAVASTATIN SODIUM 20 MG PO TABS: 20.0000 mg | ORAL_TABLET | Freq: Every day | ORAL | Status: DC

## 2013-08-03 NOTE — Patient Instructions (Addendum)
Please complete the following lab tests before your next follow up appointment: BMET, CBCD- 401.9 FLP, LFTs, TSH - 272.4 PSA - V10.46

## 2013-08-03 NOTE — Assessment & Plan Note (Signed)
Patient reports excessive mucus production after meals.  Trial of intranasal Atrovent 0.06%. Patient not having any significant dysphasia at this point. He defers referral to ENT. Should symptoms worsen, we discussed obtaining video barium swallow and possible referral to ENT.

## 2013-08-03 NOTE — Assessment & Plan Note (Signed)
BP is stable.  No change in medication. BP: 144/82 mmHg  Monitor electrolytes and kidney function before next office visit.

## 2013-08-03 NOTE — Assessment & Plan Note (Signed)
Patient tolerating pravastatin. Monitor liver function tests and FLP.

## 2013-08-03 NOTE — Assessment & Plan Note (Addendum)
Patient to follow up with Dr. Alinda Money.  Patient's last cystoscopy completed on 11/24/2012. Bladder washings showed urothelial cells with slight cytologic atypia.

## 2013-08-03 NOTE — Progress Notes (Signed)
Pre visit review using our clinic review tool, if applicable. No additional management support is needed unless otherwise documented below in the visit note. 

## 2013-08-03 NOTE — Progress Notes (Signed)
Subjective:    Patient ID: Jeremy Kelley, male    DOB: 01-14-1927, 80 y.o.   MRN: [id number]  HPI  80 year old white male with history of prostate cancer, bladder cancer and hypertension for followup. Interval medical history-patient seen by urologists in August of 2014. He underwent a followup cystoscopy with bladder washing. Pathology report reviewed. It showed urothelial cells with slight cytologic atypia.  Patient reports he has upcoming visit with Dr. Alinda Money.  Hypertension-patient reports occasionally monitoring blood pressure while he was living in [place]. He rarely experiences dizziness. It usually occurs with walking up 3 flights of steps. His symptoms are transient.  She also complains of mild difficulty in swallowing large pills especially in his Ocuvite. He denies any lower esophageal dysphagia. He also complains of excessive mucus production after meals.   Review of Systems Negative for weight loss.  Negative for chest pain    Past Medical History  Diagnosis Date  . History of prostate cancer 2001  . Hypertension   . Colon polyps   . Cataract   . Phimosis   . Carotid artery stenosis 11/2008    bilateral 40-59% stenosis  . Hepatitis     doesn't know which type 1969  . Hyperlipidemia   . Numbness     fingertips  . Urothelial carcinoma of left distal ureter     History   Social History  . Marital Status: Single    Spouse Name: N/A    Number of Children: N/A  . Years of Education: N/A   Occupational History  . Not on file.   Social History Main Topics  . Smoking status: Passive Smoke Exposure - Never Smoker    Types: Cigars  . Smokeless tobacco: Never Used     Comment: occasionally smokes a cigar  . Alcohol Use: 0.0 oz/week    6-7 Glasses of wine per week     Comment: 3-4 glasses of wine per week-no history of excessinve alcohol use  . Drug Use: No  . Sexual Activity: Not on file   Other Topics Concern  . Not on file   Social History Narrative    Patient is single, has never been married.  Does not have any children.  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   He drinks approximately 3-4 glasses of wine per week.  No history of excessive alcohol use.  Occasionally smokes a cigar.   Spends winters in [place]         Past Surgical History  Procedure Laterality Date  . Inguinal hernia repair    . Varicose vein surgery    . Other surgical history      s/p laparoscopic prostate surgery  . Skin cancer excision      a/p skin cancer right foot-non melanoma  . Bladder suspension  08/18/10    [name]  . Eye surgery  09/24/10    left eye. "Retina surgery"  . Transurethral resection of bladder tumor  10/01/2011    Procedure: TRANSURETHRAL RESECTION OF BLADDER TUMOR (TURBT);  Surgeon: [name], MD;  Location: [facility];  Service: Urology;  Laterality: N/A;  . Esophageal biopsy  10/01/2011    Procedure: BIOPSY;  Surgeon: [name], MD;  Location: [facility];  Service: Urology;;  biopsy of bladder tumor  . Ureteroscopy  01/27/2012    Procedure: URETEROSCOPY;  Surgeon: [name], MD;  Location: [facility];  Service: Urology;  Laterality: Left;  CYSTO, Left RETROGRADE PYELOGRPHY, LEFT URETEROSCOPY   .  Cystoscopy w/ retrogrades  01/27/2012    Procedure: CYSTOSCOPY WITH RETROGRADE PYELOGRAM;  Surgeon: [name], MD;  Location: [facility];  Service: Urology;  Laterality: Left;  . Hernia repair      x2  . Urinary sphincter implant    . Cystoscopy with retrograde pyelogram, ureteroscopy and stent placement Left 11/24/2012    Procedure: CYSTOSCOPY WITH left RETROGRADE PYELOGRAM, bladder washings, ureteroscopy;  Surgeon: [name], MD;  Location: [facility];  Service: Urology;  Laterality: Left;    No family history on file.  Allergies  Allergen Reactions  . Lisinopril     REACTION: dizziness    Current Outpatient Prescriptions on File Prior to Visit  Medication Sig Dispense Refill  . alprostadil (MUSE) 250 MCG pellet 1 each (250  mcg total) by Transurethral route as needed for erectile dysfunction. use no more than 3 times per week  6 each  11  . aspirin EC 81 MG tablet Take 81 mg by mouth daily.      . B Complex-C (B-COMPLEX WITH VITAMIN C) tablet Take 1 tablet by mouth daily.      . methylcellulose (ARTIFICIAL TEARS) 1 % ophthalmic solution Place 1 drop into both eyes 2 (two) times daily.       . Multiple Vitamin (MULTIVITAMIN) tablet Take 1 tablet by mouth daily with breakfast.       . Multiple Vitamins-Minerals (PRESERVISION/LUTEIN PO) Take 1 tablet by mouth daily.       No current facility-administered medications on file prior to visit.    BP 144/82  Pulse 60  Temp(Src) 98.3 F (36.8 C) (Oral)  Ht 5\' 10"  (1.778 m)  Wt 143 lb (64.864 kg)  BMI 20.52 kg/m2    Objective:   Physical Exam  Constitutional: He appears well-developed and well-nourished.  HENT:  Head: Normocephalic and atraumatic.  Mouth/Throat: Oropharynx is clear and moist.  Uvula is midline  Neck: Neck supple.  Cardiovascular: Normal rate, regular rhythm and normal heart sounds.   Pulmonary/Chest: Effort normal and breath sounds normal. He has no wheezes.  Lymphadenopathy:    He has no cervical adenopathy.  Neurological: He is alert. No cranial nerve deficit.       Assessment & Plan:

## 2013-08-06 DIAGNOSIS — H04129 Dry eye syndrome of unspecified lacrimal gland: Secondary | ICD-10-CM | POA: Diagnosis not present

## 2013-08-06 DIAGNOSIS — H1045 Other chronic allergic conjunctivitis: Secondary | ICD-10-CM | POA: Diagnosis not present

## 2013-08-06 DIAGNOSIS — H31099 Other chorioretinal scars, unspecified eye: Secondary | ICD-10-CM | POA: Diagnosis not present

## 2013-08-21 DIAGNOSIS — C669 Malignant neoplasm of unspecified ureter: Secondary | ICD-10-CM | POA: Diagnosis not present

## 2013-08-21 DIAGNOSIS — C679 Malignant neoplasm of bladder, unspecified: Secondary | ICD-10-CM | POA: Diagnosis not present

## 2013-08-21 DIAGNOSIS — C61 Malignant neoplasm of prostate: Secondary | ICD-10-CM | POA: Diagnosis not present

## 2013-08-23 ENCOUNTER — Other Ambulatory Visit: Payer: Self-pay | Admitting: Urology

## 2013-08-28 DIAGNOSIS — C61 Malignant neoplasm of prostate: Secondary | ICD-10-CM | POA: Diagnosis not present

## 2013-09-14 ENCOUNTER — Other Ambulatory Visit (INDEPENDENT_AMBULATORY_CARE_PROVIDER_SITE_OTHER): Payer: Medicare Other

## 2013-09-14 DIAGNOSIS — E785 Hyperlipidemia, unspecified: Secondary | ICD-10-CM

## 2013-09-14 DIAGNOSIS — Z8546 Personal history of malignant neoplasm of prostate: Secondary | ICD-10-CM

## 2013-09-14 DIAGNOSIS — I10 Essential (primary) hypertension: Secondary | ICD-10-CM | POA: Diagnosis not present

## 2013-09-14 LAB — BASIC METABOLIC PANEL
BUN: 17 mg/dL (ref 6–23)
CALCIUM: 9.7 mg/dL (ref 8.4–10.5)
CO2: 31 mEq/L (ref 19–32)
Chloride: 105 mEq/L (ref 96–112)
Creatinine, Ser: 1 mg/dL (ref 0.4–1.5)
GFR: 72.2 mL/min (ref >60.00)
GLUCOSE: 92 mg/dL (ref 70–99)
Potassium: 5.1 mEq/L (ref 3.5–5.1)
Sodium: 140 mEq/L (ref 135–145)

## 2013-09-14 LAB — CBC WITH DIFFERENTIAL/PLATELET
BASOS ABS: 0 10*3/uL (ref 0.0–0.1)
Basophils Relative: 0.3 % (ref 0.0–3.0)
EOS PCT: 3.8 % (ref 0.0–5.0)
Eosinophils Absolute: 0.3 10*3/uL (ref 0.0–0.7)
HCT: 43.8 % (ref 39.0–52.0)
Hemoglobin: 14.5 g/dL (ref 13.0–17.0)
Lymphocytes Relative: 19.5 % (ref 12.0–46.0)
Lymphs Abs: 1.7 10*3/uL (ref 0.7–4.0)
MCHC: 33.1 g/dL (ref 30.0–36.0)
MCV: 95.2 fl (ref 78.0–100.0)
MONOS PCT: 5.6 % (ref 3.0–12.0)
Monocytes Absolute: 0.5 10*3/uL (ref 0.1–1.0)
NEUTROS PCT: 70.8 % (ref 43.0–77.0)
Neutro Abs: 6.1 10*3/uL (ref 1.4–7.7)
Platelets: 203 10*3/uL (ref 150.0–400.0)
RBC: 4.61 Mil/uL (ref 4.22–5.81)
RDW: 14.1 % (ref 11.5–15.5)
WBC: 8.6 10*3/uL (ref 4.0–10.5)

## 2013-09-14 LAB — LIPID PANEL
CHOLESTEROL: 135 mg/dL (ref 0–200)
HDL: 47 mg/dL (ref >39.00)
LDL Cholesterol: 76 mg/dL (ref 0–99)
NonHDL: 88
TRIGLYCERIDES: 62 mg/dL (ref 0.0–149.0)
Total CHOL/HDL Ratio: 3
VLDL: 12.4 mg/dL (ref 0.0–40.0)

## 2013-09-14 LAB — PSA

## 2013-09-14 LAB — HEPATIC FUNCTION PANEL
ALT: 15 U/L (ref 0–53)
AST: 25 U/L (ref 0–37)
Albumin: 4 g/dL (ref 3.5–5.2)
Alkaline Phosphatase: 46 U/L (ref 39–117)
Bilirubin, Direct: 0.2 mg/dL (ref 0.0–0.3)
TOTAL PROTEIN: 6.4 g/dL (ref 6.0–8.3)
Total Bilirubin: 0.9 mg/dL (ref 0.2–1.2)

## 2013-09-14 LAB — TSH: TSH: 0.95 u[IU]/mL (ref 0.35–4.50)

## 2013-09-17 ENCOUNTER — Telehealth: Payer: Self-pay | Admitting: Internal Medicine

## 2013-09-17 NOTE — Telephone Encounter (Signed)
Pt called to say Dr Shawna Orleans told to call if he has a change in his bp he checked his bp at the drug store and it was 120/54 when it normally runs 120/80

## 2013-09-18 NOTE — Telephone Encounter (Signed)
As long as he is not experiencing dizziness or lightheadedness, no change in medication.

## 2013-09-18 NOTE — Telephone Encounter (Signed)
Pt is having dizziness but pt has an appt with you tomorrow so he will discuss it with you then.

## 2013-09-19 ENCOUNTER — Ambulatory Visit (INDEPENDENT_AMBULATORY_CARE_PROVIDER_SITE_OTHER): Payer: Medicare Other | Admitting: Internal Medicine

## 2013-09-19 ENCOUNTER — Encounter: Payer: Self-pay | Admitting: Internal Medicine

## 2013-09-19 ENCOUNTER — Ambulatory Visit (INDEPENDENT_AMBULATORY_CARE_PROVIDER_SITE_OTHER)
Admission: RE | Admit: 2013-09-19 | Discharge: 2013-09-19 | Disposition: A | Discharge status: Home / Self Care | Payer: Medicare Other | Source: Ambulatory Visit | Attending: Internal Medicine | Admitting: Internal Medicine

## 2013-09-19 VITALS — BP 158/72 | HR 70 | Temp 101.1°F | Ht 70.0 in | Wt 139.0 lb

## 2013-09-19 DIAGNOSIS — L989 Disorder of the skin and subcutaneous tissue, unspecified: Secondary | ICD-10-CM | POA: Diagnosis not present

## 2013-09-19 DIAGNOSIS — I10 Essential (primary) hypertension: Secondary | ICD-10-CM

## 2013-09-19 DIAGNOSIS — D0339 Melanoma in situ of other parts of face: Secondary | ICD-10-CM | POA: Insufficient documentation

## 2013-09-19 DIAGNOSIS — R059 Cough, unspecified: Secondary | ICD-10-CM

## 2013-09-19 DIAGNOSIS — R05 Cough: Secondary | ICD-10-CM | POA: Insufficient documentation

## 2013-09-19 HISTORY — DX: Melanoma in situ of other parts of face: D03.39

## 2013-09-19 MED ORDER — DOXYCYCLINE HYCLATE 100 MG PO TABS: 100.0000 mg | ORAL_TABLET | Freq: Two times a day (BID) | ORAL | Status: DC

## 2013-09-19 MED ORDER — METOPROLOL SUCCINATE ER 25 MG PO TB24: 25.0000 mg | ORAL_TABLET | Freq: Every day | ORAL | Status: DC

## 2013-09-19 NOTE — Progress Notes (Signed)
Pre visit review using our clinic review tool, if applicable. No additional management support is needed unless otherwise documented below in the visit note. 

## 2013-09-19 NOTE — Progress Notes (Signed)
Subjective:    Patient ID: Jeremy Kelley, male    DOB: [birth year], 80 y.o.   MRN: [id number]  HPI  80 year old white male with history of hypertension, hyperlipidemia, prostate cancer and bladder cancer complains of fatigue and dizzy spells since Sunday.  His symptoms started after he came back from a long bicycle ride. He felt transiently lightheaded. He denies syncopal episode. He monitored his blood pressure over the next couple of days. On Monday his blood pressure is 120/56 with pulse of 54 and Tuesday 99/52 with pulse 55.  Patient also complains of intermittent cough. Patient produces yellow sputum.  He also has rash on middle finger of left hand.  Area is slightly red. Review of Systems No chills but he has fever today, fatigue     Past Medical History  Diagnosis Date  . History of prostate cancer 2001  . Hypertension   . Colon polyps   . Cataract   . Phimosis   . Carotid artery stenosis 11/2008    bilateral 40-59% stenosis  . Hepatitis     doesn't know which type 1969  . Hyperlipidemia   . Numbness     fingertips  . Urothelial carcinoma of left distal ureter     History   Social History  . Marital Status: Single    Spouse Name: N/A    Number of Children: N/A  . Years of Education: N/A   Occupational History  . Not on file.   Social History Main Topics  . Smoking status: Passive Smoke Exposure - Never Smoker    Types: Cigars  . Smokeless tobacco: Never Used     Comment: occasionally smokes a cigar  . Alcohol Use: 0.0 oz/week    6-7 Glasses of wine per week     Comment: 3-4 glasses of wine per week-no history of excessinve alcohol use  . Drug Use: No  . Sexual Activity: Not on file   Other Topics Concern  . Not on file   Social History Narrative   Patient is single, has never been married.  Does not have any children.  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   He drinks approximately 3-4 glasses of wine per week.  No history of  excessive alcohol use.  Occasionally smokes a cigar.   Spends winters in [place]         Past Surgical History  Procedure Laterality Date  . Inguinal hernia repair    . Varicose vein surgery    . Other surgical history      s/p laparoscopic prostate surgery  . Skin cancer excision      a/p skin cancer right foot-non melanoma  . Bladder suspension  08/18/10    [name]  . Eye surgery  09/24/10    left eye. "Retina surgery"  . Transurethral resection of bladder tumor  10/01/2011    Procedure: TRANSURETHRAL RESECTION OF BLADDER TUMOR (TURBT);  Surgeon: [name], MD;  Location: [facility];  Service: Urology;  Laterality: N/A;  . Esophageal biopsy  10/01/2011    Procedure: BIOPSY;  Surgeon: [name], MD;  Location: [facility];  Service: Urology;;  biopsy of bladder tumor  . Ureteroscopy  01/27/2012    Procedure: URETEROSCOPY;  Surgeon: [name], MD;  Location: [facility];  Service: Urology;  Laterality: Left;  CYSTO, Left RETROGRADE PYELOGRPHY, LEFT URETEROSCOPY   . Cystoscopy w/ retrogrades  01/27/2012    Procedure: CYSTOSCOPY WITH RETROGRADE PYELOGRAM;  Surgeon: [name], MD;  Location: [facility];  Service: Urology;  Laterality: Left;  . Hernia repair      x2  . Urinary sphincter implant    . Cystoscopy with retrograde pyelogram, ureteroscopy and stent placement Left 11/24/2012    Procedure: CYSTOSCOPY WITH left RETROGRADE PYELOGRAM, bladder washings, ureteroscopy;  Surgeon: [name], MD;  Location: [facility];  Service: Urology;  Laterality: Left;    No family history on file.  Allergies  Allergen Reactions  . Lisinopril     REACTION: dizziness    Current Outpatient Prescriptions on File Prior to Visit  Medication Sig Dispense Refill  . alprostadil (MUSE) 250 MCG pellet 1 each (250 mcg total) by Transurethral route as needed for erectile dysfunction. use no more than 3 times per week  6 each  11  . aspirin EC 81 MG tablet Take 81 mg by mouth daily.      . B Complex-C (B-COMPLEX WITH  VITAMIN C) tablet Take 1 tablet by mouth daily.      [name] losartan (COZAAR) 25 MG tablet Take 1 tablet (25 mg total) by mouth daily.  90 tablet  3  . methylcellulose (ARTIFICIAL TEARS) 1 % ophthalmic solution Place 1 drop into both eyes 2 (two) times daily.       . Multiple Vitamin (MULTIVITAMIN) tablet Take 1 tablet by mouth daily with breakfast.       . Multiple Vitamins-Minerals (PRESERVISION/LUTEIN PO) Take 1 tablet by mouth daily.      . pravastatin (PRAVACHOL) 20 MG tablet Take 1 tablet (20 mg total) by mouth at bedtime.  90 tablet  3   No current facility-administered medications on file prior to visit.    BP 158/72  Pulse 70  Temp(Src) 101.1 F (38.4 C) (Oral)  Ht 5\' 10"  (1.778 m)  Wt 139 lb (63.05 kg)  BMI 19.94 kg/m2    Objective:   Physical Exam  Constitutional: He is oriented to person, place, and time. He appears well-developed and well-nourished. No distress.  HENT:  Head: Normocephalic and atraumatic.  Right Ear: External ear normal.  Left Ear: External ear normal.  Mild oropharyngeal erythema  Neck: Neck supple.  Cardiovascular: Normal rate, regular rhythm and normal heart sounds.   No murmur heard. Pulmonary/Chest: Effort normal. He has no wheezes.  Slight coarse breath sounds bilaterally  Abdominal: Soft. Bowel sounds are normal. There is no tenderness.  Lymphadenopathy:    He has no cervical adenopathy.  Neurological: He is alert and oriented to person, place, and time. No cranial nerve deficit.  Skin:  2 cm oblong flat hyperpigmented lesion on right cheek  Psychiatric: He has a normal mood and affect. His behavior is normal.          Assessment & Plan:

## 2013-09-19 NOTE — Assessment & Plan Note (Signed)
80 year old with intermittent productive cough. Chest exam is unremarkable. Obtain chest x-ray to rule out atypical pneumonia. Sinusitis is another consideration. Empiric treatment with doxycycline 100 mg twice daily for 10 days.  Patient advised to call office if symptoms persist or worsen.

## 2013-09-19 NOTE — Assessment & Plan Note (Signed)
Patient experiencing mild intermittent dizziness with bradycardia. Discontinue bisoprolol. Switch to metoprolol XL 25 mg once daily. His dizziness may be related to infectious process (cough).  Reassess in 2 weeks.

## 2013-09-19 NOTE — Patient Instructions (Signed)
Please contact our office if your symptoms do not improve or gets worse.  

## 2013-09-19 NOTE — Assessment & Plan Note (Signed)
Patient has flat hyperpigmented lesion on right cheek. Refer to dermatology for further evaluation.  Rule out lentigo maligna.

## 2013-09-21 ENCOUNTER — Encounter: Payer: Self-pay | Admitting: Internal Medicine

## 2013-09-21 ENCOUNTER — Ambulatory Visit (INDEPENDENT_AMBULATORY_CARE_PROVIDER_SITE_OTHER): Payer: Medicare Other | Admitting: Internal Medicine

## 2013-09-21 ENCOUNTER — Telehealth: Payer: Self-pay | Admitting: *Deleted

## 2013-09-21 VITALS — BP 132/72 | Temp 98.1°F | Wt 139.0 lb

## 2013-09-21 DIAGNOSIS — I10 Essential (primary) hypertension: Secondary | ICD-10-CM | POA: Diagnosis not present

## 2013-09-21 DIAGNOSIS — R059 Cough, unspecified: Secondary | ICD-10-CM | POA: Diagnosis not present

## 2013-09-21 DIAGNOSIS — R05 Cough: Secondary | ICD-10-CM

## 2013-09-21 MED ORDER — BENZONATATE 100 MG PO CAPS: 100.0000 mg | ORAL_CAPSULE | Freq: Two times a day (BID) | ORAL | Status: DC | PRN

## 2013-09-21 MED ORDER — HYDROCODONE-HOMATROPINE 5-1.5 MG/5ML PO SYRP: 5.0000 mL | ORAL_SOLUTION | Freq: Three times a day (TID) | ORAL | Status: DC | PRN

## 2013-09-21 NOTE — Assessment & Plan Note (Signed)
Bradycardia and low normal BP improved.  Continue metoprolol xl 25 mg once daily and losartan 25 mg. BP: 132/72 mmHg

## 2013-09-21 NOTE — Telephone Encounter (Signed)
Jeremy Kelley called saying he is still has a productive cough.  He is leaving the country Sunday.  Per Dr Shawna Orleans ok to call in tessalon pearles 100 mg tid prn #30/0 refills and he can pick up hycodan 5 ml q12h prn 171mL no refills.  Pt aware

## 2013-09-21 NOTE — Patient Instructions (Signed)
Please contact our office if your symptoms do not improve or gets worse.  

## 2013-09-21 NOTE — Assessment & Plan Note (Addendum)
CXR negative for pneumonia.  I suspect improving bronchitis.  Continue 10 day course of doxycycline.  Use Tessalon Perles during the day for cough and Hycodan at bedtime for cough as needed.  Patient advised not to travel until his symptoms completely resolved.

## 2013-09-21 NOTE — Progress Notes (Signed)
Subjective:    Patient ID: Jeremy Kelley, male    DOB: 10/27/1926, 80 y.o.   MRN: [id number]  HPI  80 year old white male with history of prostate cancer and hypertension recently seen for possible bronchitis for followup. Patient's chest x-ray was negative for pneumonia. He is on day 2 of  doxycycline 100 mg twice daily. Patient reports deep productive cough is slightly improved.    Hypertension - lightheaded sensation improved.  Review of Systems Patient denies fevers, no shortness of breath    Past Medical History  Diagnosis Date  . History of prostate cancer 2001  . Hypertension   . Colon polyps   . Cataract   . Phimosis   . Carotid artery stenosis 11/2008    bilateral 40-59% stenosis  . Hepatitis     doesn't know which type 1969  . Hyperlipidemia   . Numbness     fingertips  . Urothelial carcinoma of left distal ureter     History   Social History  . Marital Status: Single    Spouse Name: N/A    Number of Children: N/A  . Years of Education: N/A   Occupational History  . Not on file.   Social History Main Topics  . Smoking status: Passive Smoke Exposure - Never Smoker    Types: Cigars  . Smokeless tobacco: Never Used     Comment: occasionally smokes a cigar  . Alcohol Use: 0.0 oz/week    6-7 Glasses of wine per week     Comment: 3-4 glasses of wine per week-no history of excessinve alcohol use  . Drug Use: No  . Sexual Activity: Not on file   Other Topics Concern  . Not on file   Social History Narrative   Patient is single, has never been married.  Does not have any children.  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   He drinks approximately 3-4 glasses of wine per week.  No history of excessive alcohol use.  Occasionally smokes a cigar.   Spends winters in [place]         Past Surgical History  Procedure Laterality Date  . Inguinal hernia repair    . Varicose vein surgery    . Other surgical history      s/p  laparoscopic prostate surgery  . Skin cancer excision      a/p skin cancer right foot-non melanoma  . Bladder suspension  08/18/10    [name]  . Eye surgery  09/24/10    left eye. "Retina surgery"  . Transurethral resection of bladder tumor  10/01/2011    Procedure: TRANSURETHRAL RESECTION OF BLADDER TUMOR (TURBT);  Surgeon: [name], MD;  Location: [facility];  Service: Urology;  Laterality: N/A;  . Esophageal biopsy  10/01/2011    Procedure: BIOPSY;  Surgeon: [name], MD;  Location: [facility];  Service: Urology;;  biopsy of bladder tumor  . Ureteroscopy  01/27/2012    Procedure: URETEROSCOPY;  Surgeon: [name], MD;  Location: [facility];  Service: Urology;  Laterality: Left;  CYSTO, Left RETROGRADE PYELOGRPHY, LEFT URETEROSCOPY   . Cystoscopy w/ retrogrades  01/27/2012    Procedure: CYSTOSCOPY WITH RETROGRADE PYELOGRAM;  Surgeon: [name], MD;  Location: [facility];  Service: Urology;  Laterality: Left;  . Hernia repair      x2  . Urinary sphincter implant    . Cystoscopy with retrograde pyelogram, ureteroscopy and stent placement Left 11/24/2012    Procedure: CYSTOSCOPY WITH left  RETROGRADE PYELOGRAM, bladder washings, ureteroscopy;  Surgeon: [name], MD;  Location: [facility];  Service: Urology;  Laterality: Left;    No family history on file.  Allergies  Allergen Reactions  . Lisinopril     REACTION: dizziness    Current Outpatient Prescriptions on File Prior to Visit  Medication Sig Dispense Refill  . alprostadil (MUSE) 250 MCG pellet 1 each (250 mcg total) by Transurethral route as needed for erectile dysfunction. use no more than 3 times per week  6 each  11  . aspirin EC 81 MG tablet Take 81 mg by mouth daily.      . B Complex-C (B-COMPLEX WITH VITAMIN C) tablet Take 1 tablet by mouth daily.      . benzonatate (TESSALON) 100 MG capsule Take 1 capsule (100 mg total) by mouth 2 (two) times daily as needed for cough.  30 capsule  0  . doxycycline (VIBRA-TABS) 100 MG tablet Take 1  tablet (100 mg total) by mouth 2 (two) times daily.  20 tablet  0  . HYDROcodone-homatropine (HYCODAN) 5-1.5 MG/5ML syrup Take 5 mLs by mouth every 8 (eight) hours as needed for cough.  120 mL  0  . losartan (COZAAR) 25 MG tablet Take 1 tablet (25 mg total) by mouth daily.  90 tablet  3  . methylcellulose (ARTIFICIAL TEARS) 1 % ophthalmic solution Place 1 drop into both eyes 2 (two) times daily.       . metoprolol succinate (TOPROL-XL) 25 MG 24 hr tablet Take 1 tablet (25 mg total) by mouth daily.  30 tablet  3  . Multiple Vitamin (MULTIVITAMIN) tablet Take 1 tablet by mouth daily with breakfast.       . Multiple Vitamins-Minerals (PRESERVISION/LUTEIN PO) Take 1 tablet by mouth daily.      . pravastatin (PRAVACHOL) 20 MG tablet Take 1 tablet (20 mg total) by mouth at bedtime.  90 tablet  3   No current facility-administered medications on file prior to visit.    BP 132/72  Temp(Src) 98.1 F (36.7 C) (Oral)  Wt 139 lb (63.05 kg)    Objective:   Physical Exam  Constitutional: He is oriented to person, place, and time. He appears well-developed and well-nourished.  Cardiovascular: Normal rate, regular rhythm and normal heart sounds.   No murmur heard. Pulmonary/Chest: Effort normal and breath sounds normal. He has no wheezes.  Musculoskeletal: He exhibits no edema.  Neurological: He is alert and oriented to person, place, and time. No cranial nerve deficit.          Assessment & Plan:

## 2013-09-26 ENCOUNTER — Encounter: Payer: Self-pay | Admitting: Internal Medicine

## 2013-09-26 ENCOUNTER — Ambulatory Visit (INDEPENDENT_AMBULATORY_CARE_PROVIDER_SITE_OTHER): Payer: Medicare Other | Admitting: Internal Medicine

## 2013-09-26 VITALS — BP 132/66 | Temp 98.3°F | Wt 138.0 lb

## 2013-09-26 DIAGNOSIS — I10 Essential (primary) hypertension: Secondary | ICD-10-CM | POA: Diagnosis not present

## 2013-09-26 DIAGNOSIS — R059 Cough, unspecified: Secondary | ICD-10-CM

## 2013-09-26 DIAGNOSIS — R05 Cough: Secondary | ICD-10-CM | POA: Diagnosis not present

## 2013-09-26 NOTE — Assessment & Plan Note (Signed)
Patient's cough and sputum production resolved. He has mild residual fatigue. I suggest patient continue to use intranasal saline spray. I suspect his appetite should improve once he discontinues doxycycline.

## 2013-09-26 NOTE — Assessment & Plan Note (Signed)
BP is stable.  I suspect mild fatigue from resolving URI.  If persistent fatigue, Consider switching beta blocker to diltiazem.   BP: 132/66 mmHg

## 2013-09-26 NOTE — Progress Notes (Signed)
Subjective:    Patient ID: Jeremy Kelley, male    DOB: 1926-09-13, 80 y.o.   MRN: [id number]  HPI  80 year old white male previously seen for possible bronchitis versus sinusitis for followup. Patient is on his last day of doxycycline. He has not had any significant side effects other than slight loss of appetite.  His cough is significantly improved. He no longer has discolored sputum production. He has mild fatigue.  Hypertension - stable. HR stable on metoprolol.  Review of Systems Negative for fever, negative for shortness of breath    Past Medical History  Diagnosis Date  . History of prostate cancer 2001  . Hypertension   . Colon polyps   . Cataract   . Phimosis   . Carotid artery stenosis 11/2008    bilateral 40-59% stenosis  . Hepatitis     doesn't know which type 1969  . Hyperlipidemia   . Numbness     fingertips  . Urothelial carcinoma of left distal ureter     History   Social History  . Marital Status: Single    Spouse Name: N/A    Number of Children: N/A  . Years of Education: N/A   Occupational History  . Not on file.   Social History Main Topics  . Smoking status: Passive Smoke Exposure - Never Smoker    Types: Cigars  . Smokeless tobacco: Never Used     Comment: occasionally smokes a cigar  . Alcohol Use: 0.0 oz/week    6-7 Glasses of wine per week     Comment: 3-4 glasses of wine per week-no history of excessinve alcohol use  . Drug Use: No  . Sexual Activity: Not on file   Other Topics Concern  . Not on file   Social History Narrative   Patient is single, has never been married.  Does not have any children.  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   He drinks approximately 3-4 glasses of wine per week.  No history of excessive alcohol use.  Occasionally smokes a cigar.   Spends winters in [place]         Past Surgical History  Procedure Laterality Date  . Inguinal hernia repair    . Varicose vein surgery      . Other surgical history      s/p laparoscopic prostate surgery  . Skin cancer excision      a/p skin cancer right foot-non melanoma  . Bladder suspension  08/18/10    [name]  . Eye surgery  09/24/10    left eye. "Retina surgery"  . Transurethral resection of bladder tumor  10/01/2011    Procedure: TRANSURETHRAL RESECTION OF BLADDER TUMOR (TURBT);  Surgeon: [name], MD;  Location: [facility];  Service: Urology;  Laterality: N/A;  . Esophageal biopsy  10/01/2011    Procedure: BIOPSY;  Surgeon: [name], MD;  Location: [facility];  Service: Urology;;  biopsy of bladder tumor  . Ureteroscopy  01/27/2012    Procedure: URETEROSCOPY;  Surgeon: [name], MD;  Location: [facility];  Service: Urology;  Laterality: Left;  CYSTO, Left RETROGRADE PYELOGRPHY, LEFT URETEROSCOPY   . Cystoscopy w/ retrogrades  01/27/2012    Procedure: CYSTOSCOPY WITH RETROGRADE PYELOGRAM;  Surgeon: [name], MD;  Location: [facility];  Service: Urology;  Laterality: Left;  . Hernia repair      x2  . Urinary sphincter implant    . Cystoscopy with retrograde pyelogram, ureteroscopy and stent placement  Left 11/24/2012    Procedure: CYSTOSCOPY WITH left RETROGRADE PYELOGRAM, bladder washings, ureteroscopy;  Surgeon: [name], MD;  Location: [facility];  Service: Urology;  Laterality: Left;    No family history on file.  Allergies  Allergen Reactions  . Lisinopril     REACTION: dizziness    Current Outpatient Prescriptions on File Prior to Visit  Medication Sig Dispense Refill  . alprostadil (MUSE) 250 MCG pellet 1 each (250 mcg total) by Transurethral route as needed for erectile dysfunction. use no more than 3 times per week  6 each  11  . aspirin EC 81 MG tablet Take 81 mg by mouth daily.      . B Complex-C (B-COMPLEX WITH VITAMIN C) tablet Take 1 tablet by mouth daily.      . benzonatate (TESSALON) 100 MG capsule Take 1 capsule (100 mg total) by mouth 2 (two) times daily as needed for cough.  30 capsule  0  .  doxycycline (VIBRA-TABS) 100 MG tablet Take 1 tablet (100 mg total) by mouth 2 (two) times daily.  20 tablet  0  . HYDROcodone-homatropine (HYCODAN) 5-1.5 MG/5ML syrup Take 5 mLs by mouth every 8 (eight) hours as needed for cough.  120 mL  0  . losartan (COZAAR) 25 MG tablet Take 1 tablet (25 mg total) by mouth daily.  90 tablet  3  . methylcellulose (ARTIFICIAL TEARS) 1 % ophthalmic solution Place 1 drop into both eyes 2 (two) times daily.       . metoprolol succinate (TOPROL-XL) 25 MG 24 hr tablet Take 1 tablet (25 mg total) by mouth daily.  30 tablet  3  . Multiple Vitamin (MULTIVITAMIN) tablet Take 1 tablet by mouth daily with breakfast.       . Multiple Vitamins-Minerals (PRESERVISION/LUTEIN PO) Take 1 tablet by mouth daily.      . pravastatin (PRAVACHOL) 20 MG tablet Take 1 tablet (20 mg total) by mouth at bedtime.  90 tablet  3   No current facility-administered medications on file prior to visit.    BP 132/66  Temp(Src) 98.3 F (36.8 C) (Oral)  Wt 138 lb (62.596 kg)    Objective:   Physical Exam  Constitutional: He is oriented to person, place, and time. He appears well-developed and well-nourished. No distress.  HENT:  Head: Normocephalic and atraumatic.  Cardiovascular: Normal rate, regular rhythm, normal heart sounds and intact distal pulses.   No murmur heard. Pulmonary/Chest: Effort normal and breath sounds normal. He has no wheezes.  Neurological: He is alert and oriented to person, place, and time. No cranial nerve deficit.          Assessment & Plan:

## 2013-10-02 ENCOUNTER — Other Ambulatory Visit: Payer: Self-pay | Admitting: Dermatology

## 2013-10-02 DIAGNOSIS — C44721 Squamous cell carcinoma of skin of unspecified lower limb, including hip: Secondary | ICD-10-CM | POA: Diagnosis not present

## 2013-10-02 DIAGNOSIS — L820 Inflamed seborrheic keratosis: Secondary | ICD-10-CM | POA: Diagnosis not present

## 2013-10-02 DIAGNOSIS — D485 Neoplasm of uncertain behavior of skin: Secondary | ICD-10-CM | POA: Diagnosis not present

## 2013-10-02 DIAGNOSIS — L570 Actinic keratosis: Secondary | ICD-10-CM | POA: Diagnosis not present

## 2013-11-13 ENCOUNTER — Encounter: Payer: Self-pay | Admitting: Internal Medicine

## 2013-11-13 ENCOUNTER — Ambulatory Visit (INDEPENDENT_AMBULATORY_CARE_PROVIDER_SITE_OTHER): Payer: Medicare Other | Admitting: Internal Medicine

## 2013-11-13 VITALS — BP 122/74 | HR 68 | Temp 98.7°F | Ht 70.0 in | Wt 142.0 lb

## 2013-11-13 DIAGNOSIS — L989 Disorder of the skin and subcutaneous tissue, unspecified: Secondary | ICD-10-CM

## 2013-11-13 DIAGNOSIS — I10 Essential (primary) hypertension: Secondary | ICD-10-CM

## 2013-11-13 DIAGNOSIS — C679 Malignant neoplasm of bladder, unspecified: Secondary | ICD-10-CM | POA: Diagnosis not present

## 2013-11-13 DIAGNOSIS — D0470 Carcinoma in situ of skin of unspecified lower limb, including hip: Secondary | ICD-10-CM

## 2013-11-13 DIAGNOSIS — L570 Actinic keratosis: Secondary | ICD-10-CM | POA: Diagnosis not present

## 2013-11-13 DIAGNOSIS — D0471 Carcinoma in situ of skin of right lower limb, including hip: Secondary | ICD-10-CM | POA: Insufficient documentation

## 2013-11-13 DIAGNOSIS — C44721 Squamous cell carcinoma of skin of unspecified lower limb, including hip: Secondary | ICD-10-CM | POA: Diagnosis not present

## 2013-11-13 MED ORDER — METOPROLOL SUCCINATE ER 25 MG PO TB24: 25.0000 mg | ORAL_TABLET | Freq: Every day | ORAL | Status: DC

## 2013-11-13 NOTE — Assessment & Plan Note (Signed)
Patient experiencing intermittent lightheadedness. Decrease losartan to 12.5 mg. When he plans extended physical activity, patient also advised to decrease metoprolol xl  to 12.5 mg once daily.  BP: 122/74 mmHg

## 2013-11-13 NOTE — Assessment & Plan Note (Signed)
Followed by dermatology. Further excision planned.

## 2013-11-13 NOTE — Assessment & Plan Note (Signed)
Patient evaluated by dermatology. Patient diagnosed with take lentigo maligna melanoma.  Area excised.  Continue follow up with dermatology

## 2013-11-13 NOTE — Assessment & Plan Note (Signed)
Followed by urology. Surveillance cystoscopy planned for tomorrow.

## 2013-11-13 NOTE — Progress Notes (Signed)
Subjective:    Patient ID: Jeremy Kelley, male    DOB: 1926-06-26, 80 y.o.   MRN: [id number]  HPI  80 year old white male with history of prostate cancer, bladder cancer, hypertension and hyperlipidemia for routine followup. Interval medical history-at last visit patient referred to dermatology for hyperpigmented skin lesion of face. Patient diagnosed with lentigo maligna melanoma.  Facial lesion was excised.  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 His dermatologist is planning excision.  Urothelial carcinoma -surveillance cystoscopy planned for tomorrow.  Hypertension-patient reports intermittent dizziness. Symptoms occur with prolonged sitting and also with climbing 2 or 3 flights of stairs. He is much more symptomatic when he is living in [place]. Daily life in [place] involves much more walking.  Review of Systems Negative for chest pain,  Dyspnea with climbing 3 flights of stairs    Past Medical History  Diagnosis Date  . History of prostate cancer 2001  . Hypertension   . Colon polyps   . Cataract   . Phimosis   . Carotid artery stenosis 11/2008    bilateral 40-59% stenosis  . Hepatitis     doesn't know which type 1969  . Hyperlipidemia   . Numbness     fingertips  . Urothelial carcinoma of left distal ureter     History   Social History  . Marital Status: Single    Spouse Name: N/A    Number of Children: N/A  . Years of Education: N/A   Occupational History  . Not on file.   Social History Main Topics  . Smoking status: Passive Smoke Exposure - Never Smoker    Types: Cigars  . Smokeless tobacco: Never Used     Comment: occasionally smokes a cigar  . Alcohol Use: 0.0 oz/week    6-7 Glasses of wine per week     Comment: 3-4 glasses of wine per week-no history of excessinve alcohol use  . Drug Use: No  . Sexual Activity: Not on file   Other Topics Concern  . Not on file   Social History Narrative   Patient is single, has never been married.  Does not have any children.  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   He drinks approximately 3-4 glasses of wine per week.  No history of excessive alcohol use.  Occasionally smokes a cigar.   Spends winters in [place]         Past Surgical History  Procedure Laterality Date  . Inguinal hernia repair    . Varicose vein surgery    . Other surgical history      s/p laparoscopic prostate surgery  . Skin cancer excision      a/p skin cancer right foot-non melanoma  . Bladder suspension  08/18/10    [name]  . Eye surgery  09/24/10    left eye. "Retina surgery"  . Transurethral resection of bladder tumor  10/01/2011    Procedure: TRANSURETHRAL RESECTION OF BLADDER TUMOR (TURBT);  Surgeon: [name], MD;  Location: [facility];  Service: Urology;  Laterality: N/A;  . Esophageal biopsy  10/01/2011    Procedure: BIOPSY;  Surgeon: [name], MD;  Location: [facility];  Service: Urology;;  biopsy of bladder tumor  . Ureteroscopy  01/27/2012    Procedure: URETEROSCOPY;  Surgeon: [name], MD;  Location: [facility];  Service: Urology;  Laterality: Left;  CYSTO, Left RETROGRADE PYELOGRPHY, LEFT URETEROSCOPY   . Cystoscopy w/ retrogrades  01/27/2012    Procedure: CYSTOSCOPY WITH RETROGRADE PYELOGRAM;  Surgeon: [name], MD;  Location: [facility];  Service: Urology;  Laterality: Left;  . Hernia repair      x2  . Urinary sphincter implant    . Cystoscopy with retrograde pyelogram, ureteroscopy and stent placement Left 11/24/2012    Procedure: CYSTOSCOPY WITH left RETROGRADE PYELOGRAM, bladder washings, ureteroscopy;  Surgeon: [name], MD;  Location: [facility];  Service: Urology;  Laterality: Left;    No family history on file.  Allergies  Allergen Reactions  . Lisinopril     REACTION: dizziness    Current Outpatient Prescriptions on File Prior to Visit  Medication Sig Dispense Refill  . aspirin EC 81 MG tablet Take 81 mg by mouth daily.       . B Complex-C (B-COMPLEX WITH VITAMIN C) tablet Take 1 tablet by mouth daily.      . methylcellulose (ARTIFICIAL TEARS) 1 % ophthalmic solution Place 1 drop into both eyes 2 (two) times daily.       . Multiple Vitamin (MULTIVITAMIN) tablet Take 1 tablet by mouth daily with breakfast.       . Multiple Vitamins-Minerals (PRESERVISION/LUTEIN PO) Take 1 tablet by mouth daily.      . pravastatin (PRAVACHOL) 20 MG tablet Take 1 tablet (20 mg total) by mouth at bedtime.  90 tablet  3   No current facility-administered medications on file prior to visit.    BP 122/74  Pulse 68  Temp(Src) 98.7 F (37.1 C) (Oral)  Ht 5\' 10"  (1.778 m)  Wt 142 lb (64.411 kg)  BMI 20.37 kg/m2    Objective:   Physical Exam  Constitutional: He is oriented to person, place, and time. He appears well-developed and well-nourished.  HENT:  Head: Normocephalic and atraumatic.  Cardiovascular: Normal rate, regular rhythm and normal heart sounds.   No murmur heard. Pulmonary/Chest: Effort normal and breath sounds normal. He has no wheezes.  Musculoskeletal: He exhibits no edema.  Neurological: He is alert and oriented to person, place, and time. No cranial nerve deficit.  Skin: Skin is warm and dry.  Two scaly lesions right lower leg  Psychiatric: He has a normal mood and affect. His behavior is normal.          Assessment & Plan:

## 2013-11-13 NOTE — Progress Notes (Signed)
Pre visit review using our clinic review tool, if applicable. No additional management support is needed unless otherwise documented below in the visit note. 

## 2013-11-14 ENCOUNTER — Encounter (HOSPITAL_COMMUNITY): Payer: Self-pay | Admitting: Pharmacy Technician

## 2013-11-14 ENCOUNTER — Ambulatory Visit: Payer: Medicare Other | Admitting: Internal Medicine

## 2013-11-14 ENCOUNTER — Encounter (HOSPITAL_COMMUNITY)
Admission: RE | Admit: 2013-11-14 | Discharge: 2013-11-14 | Disposition: A | Discharge status: Home / Self Care | Payer: Medicare Other | Source: Ambulatory Visit | Attending: Urology | Admitting: Urology

## 2013-11-14 ENCOUNTER — Encounter (HOSPITAL_COMMUNITY): Payer: Self-pay

## 2013-11-14 DIAGNOSIS — Z01818 Encounter for other preprocedural examination: Secondary | ICD-10-CM | POA: Insufficient documentation

## 2013-11-14 DIAGNOSIS — Z01812 Encounter for preprocedural laboratory examination: Secondary | ICD-10-CM | POA: Insufficient documentation

## 2013-11-14 LAB — CBC
HCT: 42 % (ref 39.0–52.0)
Hemoglobin: 13.9 g/dL (ref 13.0–17.0)
MCH: 31.4 pg (ref 26.0–34.0)
MCHC: 33.1 g/dL (ref 30.0–36.0)
MCV: 94.8 fL (ref 78.0–100.0)
Platelets: 200 10*3/uL (ref 150–400)
RBC: 4.43 MIL/uL (ref 4.22–5.81)
RDW: 13.4 % (ref 11.5–15.5)
WBC: 6.8 10*3/uL (ref 4.0–10.5)

## 2013-11-14 LAB — BASIC METABOLIC PANEL
Anion gap: 8 (ref 5–15)
BUN: 14 mg/dL (ref 6–23)
CHLORIDE: 106 meq/L (ref 96–112)
CO2: 28 mEq/L (ref 19–32)
Calcium: 9.5 mg/dL (ref 8.4–10.5)
Creatinine, Ser: 1.08 mg/dL (ref 0.50–1.35)
GFR, EST AFRICAN AMERICAN: 71 mL/min — AB (ref >90)
GFR, EST NON AFRICAN AMERICAN: 61 mL/min — AB (ref >90)
Glucose, Bld: 105 mg/dL — ABNORMAL HIGH (ref 70–99)
Potassium: 4.9 mEq/L (ref 3.7–5.3)
Sodium: 142 mEq/L (ref 137–147)

## 2013-11-14 NOTE — Pre-Procedure Instructions (Signed)
11-14-13 EKG 8'14, CXR 6'15- Epic.

## 2013-11-14 NOTE — Patient Instructions (Addendum)
20 Jeremy Kelley  11/14/2013   Your procedure is scheduled on: 11-19-13 Monday at 2:55 PM  Enter through [facility] Entrance and follow signs to [facility]. Arrive at      12:45 PM.  Call this number if you have problems the morning of surgery: [phone]  Or Presurgical Testing [phone]([name]) For Living Will and/or Health Care Power Attorney Forms: please provide copy for your medical record,may bring AM of surgery(Forms should be already notarized -we do not provide this service).(11-14-13 Yes/ states-has provided information in past times).      Do not eat food:After Midnight.  May have clear liquids:up to 6 Hours before arrival. Nothing after : 0800 AM  Clear liquids include soda, tea, black coffee, apple or grape juice, broth.  Take these medicines the morning of surgery with A SIP OF WATER: none- AM of( take usual night meds(to include Metoprolol.)   Do not wear jewelry, make-up or nail polish.  Do not wear lotions, powders, or perfumes. You may wear deodorant.  Do not shave 48 hours(2 days) prior to first CHG shower(legs and under arms).(Shaving face and neck okay.)  Do not bring valuables to the hospital.(Hospital is not responsible for lost valuables).  Contacts, dentures or removable bridgework, body piercing, hair pins may not be worn into surgery.  Leave suitcase in the car. After surgery it may be brought to your room.  For patients admitted to the hospital, checkout time is 11:00 AM the day of discharge.(Restricted visitors-Any Persons displaying flu-like symptoms or illness).    Patients discharged the day of surgery will not be allowed to drive home. Must have responsible person with you x 24 hours once discharged.  Name and phone number of your driver: [name]-driver  Special Instructions: CHG(Chlorhedine 4%-"Hibiclens","Betasept","Aplicare") Shower Use Special Wash: see special instructions.(avoid face and genitals)   Please read over the  following fact sheets that you were given: MRSA Information, Blood Transfusion fact sheet, Incentive Spirometry Instruction.  Remember : Type/Screen "Blue armbands" - may not be removed once applied(would result in being retested AM of surgery, if removed).  Failure to follow these instructions may result in Cancellation of your surgery.   ____________________    [facility] - Preparing for Surgery Before surgery, you can play an important role.  Because skin is not sterile, your skin needs to be as free of germs as possible.  You can reduce the number of germs on your skin by washing with CHG (chlorahexidine gluconate) soap before surgery.  CHG is an antiseptic cleaner which kills germs and bonds with the skin to continue killing germs even after washing. Please DO NOT use if you have an allergy to CHG or antibacterial soaps.  If your skin becomes reddened/irritated stop using the CHG and inform your nurse when you arrive at Short Stay. Do not shave (including legs and underarms) for at least 48 hours prior to the first CHG shower.  You may shave your face/neck. Please follow these instructions carefully:  1.  Shower with CHG Soap the night before surgery and the  morning of Surgery.  2.  If you choose to wash your hair, wash your hair first as usual with your  normal  shampoo.  3.  After you shampoo, rinse your hair and body thoroughly to remove the  shampoo.                           4.  Use CHG as you would any other liquid soap.  You can apply chg directly  to the skin and wash                       Gently with a scrungie or clean washcloth.  5.  Apply the CHG Soap to your body ONLY FROM THE NECK DOWN.   Do not use on face/ open                           Wound or open sores. Avoid contact with eyes, ears mouth and genitals (private parts).                       Wash face,  Genitals (private parts) with your normal soap.             6.  Wash thoroughly, paying special attention to the area  where your surgery  will be performed.  7.  Thoroughly rinse your body with warm water from the neck down.  8.  DO NOT shower/wash with your normal soap after using and rinsing off  the CHG Soap.                9.  Pat yourself dry with a clean towel.            10.  Wear clean pajamas.            11.  Place clean sheets on your bed the night of your first shower and do not  sleep with pets. Day of Surgery : Do not apply any lotions/deodorants the morning of surgery.  Please wear clean clothes to the hospital/surgery center.  FAILURE TO FOLLOW THESE INSTRUCTIONS MAY RESULT IN THE CANCELLATION OF YOUR SURGERY PATIENT SIGNATURE_________________________________  NURSE SIGNATURE__________________________________  ________________________________________________________________________

## 2013-11-17 NOTE — H&P (Signed)
History of Present Illness Jeremy Kelley is an 80 year old gentleman with the following urologic history:  1) Prostate cancer: He is s/p a laparoscopic radical prostatectomy by Dr. Yolanda Bonine in [place] in August 2001. His PSA has thus far been undetectable since treatment.  2) Incontinence: He has mixed urge and stress incontinence which is very mild. He uses oxybutynin 15 mg for treatment which has benefited him although he typically wore one pad per day. He is s/p a male sling by Dr. Matilde Sprang in May 2012.    3) Erectile dysfunction: He was not responsive to PDE-5 inhibitors. He uses MUSE 125 mcg prn with good success.  4) Urothelial carcinoma: He underwent a TUR for a bladder polyp in 2004. He was found to have bladder tumors by Dr. Matilde Sprang on cystoscopy in 2013 and underwent TURBT by me in June 2013 which demonstrated multifocal low grade, Ta urothelial carcinoma of the bladder and a low grade left ureteral urothelial carcinoma treated with laser ablation.  June 2013: TURBT (3 cm right bladder tumor) - Low grade Ta, Laser ablation of low grade urothelial carcinoma of the left ureter Oct 2013: Surveillance ureteroscopy and cystoscopy - no recurrence, cytology with slight atypia Aug 2014: Surveillance ureteroscopy and cystoscopy - no recurrence   5) Balanoposthitis: He has used Lotrisone cream and vaseline prn.      Past Medical History Problems  1. Personal history of prostate cancer (V10.46)  Surgical History Problems  1. History of Cystoscopy With Fulguration Medium Lesion (2-5cm) 2. History of Cystoscopy With Insertion Of Ureteral Stent Left 3. History of Cystoscopy With Resection Of Tumor 4. History of Cystoscopy With Ureteroscopy Left 5. History of Cystoscopy With Ureteroscopy Left 6. History of Hernia Repair 7. History of Operation For Correction Of Male Urinary Incontinence 8. History of Prostatect Retropubic Radical W/ Nerve Sparing Laparoscopic 9. History of Varicose  Vein Ligation  Current Meds 1. Adult Aspirin Low Strength 81 MG TBDP;  Therapy: (Recorded:17Jun2009) to Recorded 2. Amlodipine Besy-Benazepril HCl - 5-10 MG Oral Capsule;  Therapy: (Recorded:17Jun2009) to Recorded 3. Bisoprolol Fumarate 5 MG Oral Tablet;  Therapy: [phone] to Recorded 4. BL Vitamin C 500 MG TABS;  Therapy: (Recorded:17Jun2009) to Recorded 5. Clotrimazole-Betamethasone 1-0.05 % External Cream;  Therapy: (Recorded:31Jul2008) to Recorded 6. Daily Multiple Vitamins TABS;  Therapy: (Recorded:17Jun2009) to Recorded 7. Ipratropium Bromide 0.06 % Nasal Solution;  Therapy: 24Apr2015 to Recorded 8. Losartan Potassium 25 MG Oral Tablet;  Therapy: [phone] to Recorded 9. MetroNIDAZOLE 500 MG Oral Tablet;  Therapy: [phone] to Recorded 10. Nasonex 50 MCG/ACT Nasal Suspension;   Therapy: [phone] to Recorded 11. Polyethylene Glycol 3350 Oral Powder;   Therapy: [phone] to Recorded 12. Pravastatin Sodium 20 MG Oral Tablet;   Therapy: [phone] to Recorded  Allergies Medication  1. No Known Drug Allergies  Family History Problems  1. Denied: Family history of Family Health Status Number Of Children  Social History Problems  1. Alcohol Use   drinks wine 2. Family history of Death In The Family Father   age 44; ? 69. Family history of Death In The Family Mother   age 84;? 14. Marital History - Single 5. Never A Smoker 6. Occupation:   retired 69. Denied: Tobacco Use    Physical Exam Constitutional: Well nourished and well developed . No acute distress.  ENT:. The ears and nose are normal in appearance.  Neck: The appearance of the neck is normal and no neck mass is present.  Pulmonary: No respiratory distress, normal respiratory rhythm and  effort and clear bilateral breath sounds.  Cardiovascular: Heart rate and rhythm are normal . No peripheral edema.  Neuro/Psych:. Mood and affect are appropriate.       Assessment Assessed  1. Bladder cancer  (188.9) 2. 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 Urothelial carcinoma of the bladder: He has a small recurrent bladder tumor and will undergo cystoscopy and TURBT.  He also will undergo bilateral RPGs to evaluate his upper urinary tracts considering his history of a ureteral tumor. We reviewed the potential risks, benefits, and expected recovery process associated with this procedure. He gives his informed consent.

## 2013-11-18 MED ORDER — CIPROFLOXACIN IN D5W 400 MG/200ML IV SOLN: 400.0000 mg | INTRAVENOUS | Status: DC

## 2013-11-19 ENCOUNTER — Encounter (HOSPITAL_COMMUNITY): Payer: Medicare Other | Admitting: Anesthesiology

## 2013-11-19 ENCOUNTER — Ambulatory Visit (HOSPITAL_COMMUNITY)
Admission: RE | Admit: 2013-11-19 | Discharge: 2013-11-20 | Disposition: A | Discharge status: Home / Self Care | Payer: Medicare Other | Source: Ambulatory Visit | Attending: Urology | Admitting: Urology

## 2013-11-19 ENCOUNTER — Encounter (HOSPITAL_COMMUNITY): Payer: Self-pay | Admitting: *Deleted

## 2013-11-19 ENCOUNTER — Ambulatory Visit (HOSPITAL_COMMUNITY): Payer: Medicare Other | Admitting: Anesthesiology

## 2013-11-19 ENCOUNTER — Encounter (HOSPITAL_COMMUNITY)
Admission: RE | Disposition: A | Discharge status: Home / Self Care | Payer: Self-pay | Source: Ambulatory Visit | Attending: Urology

## 2013-11-19 DIAGNOSIS — Z8546 Personal history of malignant neoplasm of prostate: Secondary | ICD-10-CM | POA: Diagnosis not present

## 2013-11-19 DIAGNOSIS — N476 Balanoposthitis: Secondary | ICD-10-CM | POA: Diagnosis not present

## 2013-11-19 DIAGNOSIS — N3289 Other specified disorders of bladder: Secondary | ICD-10-CM | POA: Diagnosis present

## 2013-11-19 DIAGNOSIS — N3946 Mixed incontinence: Secondary | ICD-10-CM | POA: Insufficient documentation

## 2013-11-19 DIAGNOSIS — Z79899 Other long term (current) drug therapy: Secondary | ICD-10-CM | POA: Diagnosis not present

## 2013-11-19 DIAGNOSIS — C679 Malignant neoplasm of bladder, unspecified: Secondary | ICD-10-CM | POA: Diagnosis not present

## 2013-11-19 DIAGNOSIS — N529 Male erectile dysfunction, unspecified: Secondary | ICD-10-CM | POA: Insufficient documentation

## 2013-11-19 DIAGNOSIS — Z7982 Long term (current) use of aspirin: Secondary | ICD-10-CM | POA: Diagnosis not present

## 2013-11-19 DIAGNOSIS — C671 Malignant neoplasm of dome of bladder: Secondary | ICD-10-CM | POA: Diagnosis not present

## 2013-11-19 DIAGNOSIS — C675 Malignant neoplasm of bladder neck: Secondary | ICD-10-CM | POA: Diagnosis not present

## 2013-11-19 DIAGNOSIS — D494 Neoplasm of unspecified behavior of bladder: Secondary | ICD-10-CM | POA: Diagnosis not present

## 2013-11-19 DIAGNOSIS — C674 Malignant neoplasm of posterior wall of bladder: Secondary | ICD-10-CM | POA: Diagnosis not present

## 2013-11-19 HISTORY — PX: TRANSURETHRAL RESECTION OF BLADDER TUMOR: SHX2575

## 2013-11-19 HISTORY — PX: CYSTOSCOPY WITH RETROGRADE PYELOGRAM, URETEROSCOPY AND STENT PLACEMENT: SHX5789

## 2013-11-19 SURGERY — TURBT (TRANSURETHRAL RESECTION OF BLADDER TUMOR)
Anesthesia: General

## 2013-11-19 MED ORDER — METOPROLOL SUCCINATE ER 25 MG PO TB24
25.0000 mg | ORAL_TABLET | Freq: Every day | ORAL | Status: DC
  Administered 2013-11-19: 25 mg via ORAL
  Filled 2013-11-19 (×2): qty 1

## 2013-11-19 MED ORDER — 0.9 % SODIUM CHLORIDE (POUR BTL) OPTIME
TOPICAL | Status: DC | PRN
  Administered 2013-11-19: 1000 mL

## 2013-11-19 MED ORDER — PROMETHAZINE HCL 25 MG/ML IJ SOLN: 6.2500 mg | INTRAMUSCULAR | Status: DC | PRN

## 2013-11-19 MED ORDER — OXYCODONE HCL 5 MG PO TABS: 5.0000 mg | ORAL_TABLET | Freq: Once | ORAL | Status: DC | PRN

## 2013-11-19 MED ORDER — ONDANSETRON HCL 4 MG/2ML IJ SOLN
INTRAMUSCULAR | Status: DC | PRN
  Administered 2013-11-19: 4 mg via INTRAVENOUS

## 2013-11-19 MED ORDER — FENTANYL CITRATE 0.05 MG/ML IJ SOLN
INTRAMUSCULAR | Status: DC | PRN
  Administered 2013-11-19: 25 ug via INTRAVENOUS

## 2013-11-19 MED ORDER — CIPROFLOXACIN IN D5W 400 MG/200ML IV SOLN
INTRAVENOUS | Status: AC
  Filled 2013-11-19: qty 200

## 2013-11-19 MED ORDER — HYDROCODONE-ACETAMINOPHEN 5-325 MG PO TABS
1.0000 | ORAL_TABLET | ORAL | Status: DC | PRN
  Administered 2013-11-19: 1 via ORAL
  Filled 2013-11-19: qty 1

## 2013-11-19 MED ORDER — LIDOCAINE HCL 2 % EX GEL
CUTANEOUS | Status: AC
  Filled 2013-11-19: qty 10

## 2013-11-19 MED ORDER — CIPROFLOXACIN IN D5W 400 MG/200ML IV SOLN
400.0000 mg | INTRAVENOUS | Status: AC
  Administered 2013-11-19: 400 mg via INTRAVENOUS

## 2013-11-19 MED ORDER — PHENAZOPYRIDINE HCL 200 MG PO TABS
200.0000 mg | ORAL_TABLET | Freq: Three times a day (TID) | ORAL | Status: DC | PRN
  Administered 2013-11-20: 200 mg via ORAL
  Filled 2013-11-19: qty 1

## 2013-11-19 MED ORDER — PROPOFOL 10 MG/ML IV BOLUS
INTRAVENOUS | Status: AC
  Filled 2013-11-19: qty 20

## 2013-11-19 MED ORDER — LACTATED RINGERS IV SOLN
INTRAVENOUS | Status: DC | PRN
  Administered 2013-11-19: 16:00:00 via INTRAVENOUS

## 2013-11-19 MED ORDER — SIMVASTATIN 10 MG PO TABS
10.0000 mg | ORAL_TABLET | Freq: Every day | ORAL | Status: DC
  Administered 2013-11-19: 10 mg via ORAL
  Filled 2013-11-19 (×2): qty 1

## 2013-11-19 MED ORDER — HYDROCODONE-ACETAMINOPHEN 5-325 MG PO TABS: 1.0000 | ORAL_TABLET | Freq: Four times a day (QID) | ORAL | Status: DC | PRN

## 2013-11-19 MED ORDER — PHENAZOPYRIDINE HCL 100 MG PO TABS: 100.0000 mg | ORAL_TABLET | Freq: Three times a day (TID) | ORAL | Status: DC | PRN

## 2013-11-19 MED ORDER — EPHEDRINE SULFATE 50 MG/ML IJ SOLN
INTRAMUSCULAR | Status: DC | PRN
  Administered 2013-11-19: 5 mg via INTRAVENOUS

## 2013-11-19 MED ORDER — SODIUM CHLORIDE 0.9 % IR SOLN
Status: DC | PRN
  Administered 2013-11-19: 4000 mL

## 2013-11-19 MED ORDER — ONDANSETRON HCL 4 MG/2ML IJ SOLN
INTRAMUSCULAR | Status: AC
  Filled 2013-11-19: qty 2

## 2013-11-19 MED ORDER — GLYCOPYRROLATE 0.2 MG/ML IJ SOLN
INTRAMUSCULAR | Status: DC | PRN
  Administered 2013-11-19: .1 mg via INTRAVENOUS

## 2013-11-19 MED ORDER — OXYCODONE HCL 5 MG/5ML PO SOLN
5.0000 mg | Freq: Once | ORAL | Status: DC | PRN
  Filled 2013-11-19: qty 5

## 2013-11-19 MED ORDER — PROPOFOL 10 MG/ML IV BOLUS
INTRAVENOUS | Status: DC | PRN
  Administered 2013-11-19: 150 mg via INTRAVENOUS

## 2013-11-19 MED ORDER — ACETAMINOPHEN 325 MG PO TABS: 650.0000 mg | ORAL_TABLET | ORAL | Status: DC | PRN

## 2013-11-19 MED ORDER — MEPERIDINE HCL 50 MG/ML IJ SOLN: 6.2500 mg | INTRAMUSCULAR | Status: DC | PRN

## 2013-11-19 MED ORDER — LIDOCAINE HCL (PF) 2 % IJ SOLN
INTRAMUSCULAR | Status: DC | PRN
  Administered 2013-11-19: 75 mg via INTRADERMAL

## 2013-11-19 MED ORDER — IOHEXOL 300 MG/ML  SOLN
INTRAMUSCULAR | Status: DC | PRN
  Administered 2013-11-19: 20 mL via INTRAVENOUS

## 2013-11-19 MED ORDER — HYDROMORPHONE HCL PF 1 MG/ML IJ SOLN
0.2500 mg | INTRAMUSCULAR | Status: DC | PRN
  Administered 2013-11-19: 0.25 mg via INTRAVENOUS

## 2013-11-19 MED ORDER — BELLADONNA ALKALOIDS-OPIUM 16.2-60 MG RE SUPP
RECTAL | Status: AC
  Filled 2013-11-19: qty 1

## 2013-11-19 MED ORDER — HYDROMORPHONE HCL PF 1 MG/ML IJ SOLN
INTRAMUSCULAR | Status: AC
  Filled 2013-11-19: qty 1

## 2013-11-19 MED ORDER — ONDANSETRON HCL 4 MG/2ML IJ SOLN: 4.0000 mg | INTRAMUSCULAR | Status: DC | PRN

## 2013-11-19 MED ORDER — SODIUM CHLORIDE 0.9 % IJ SOLN
3.0000 mL | Freq: Two times a day (BID) | INTRAMUSCULAR | Status: DC
  Administered 2013-11-19 – 2013-11-20 (×2): 3 mL via INTRAVENOUS

## 2013-11-19 MED ORDER — CIPROFLOXACIN IN D5W 400 MG/200ML IV SOLN
400.0000 mg | Freq: Two times a day (BID) | INTRAVENOUS | Status: AC
  Administered 2013-11-19 – 2013-11-20 (×2): 400 mg via INTRAVENOUS
  Filled 2013-11-19 (×2): qty 200

## 2013-11-19 MED ORDER — LOSARTAN POTASSIUM 25 MG PO TABS
12.5000 mg | ORAL_TABLET | Freq: Every morning | ORAL | Status: DC
  Administered 2013-11-19: 12.5 mg via ORAL
  Filled 2013-11-19: qty 0.5

## 2013-11-19 MED ORDER — SODIUM CHLORIDE 0.9 % IJ SOLN: 3.0000 mL | INTRAMUSCULAR | Status: DC | PRN

## 2013-11-19 MED ORDER — LIDOCAINE HCL (CARDIAC) 20 MG/ML IV SOLN
INTRAVENOUS | Status: AC
  Filled 2013-11-19: qty 5

## 2013-11-19 MED ORDER — FENTANYL CITRATE 0.05 MG/ML IJ SOLN
INTRAMUSCULAR | Status: AC
  Filled 2013-11-19: qty 2

## 2013-11-19 MED ORDER — SODIUM CHLORIDE 0.9 % IV SOLN
250.0000 mL | INTRAVENOUS | Status: DC | PRN
Start: 2013-11-19 — End: 2013-11-20

## 2013-11-19 SURGICAL SUPPLY — 23 items
BAG URINE DRAINAGE (UROLOGICAL SUPPLIES) IMPLANT
BAG URO CATCHER STRL LF (DRAPE) ×3 IMPLANT
BASKET ZERO TIP NITINOL 2.4FR (BASKET) IMPLANT
BSKT STON RTRVL ZERO TP 2.4FR (BASKET)
CATH INTERMIT  6FR 70CM (CATHETERS) ×1 IMPLANT
CLOTH BEACON ORANGE TIMEOUT ST (SAFETY) ×3 IMPLANT
DRAPE CAMERA CLOSED 9X96 (DRAPES) ×3 IMPLANT
ELECT LOOP MED HF 24F 12D CBL (CLIP) ×1 IMPLANT
ELECT REM PT RETURN 9FT ADLT (ELECTROSURGICAL)
ELECTRODE REM PT RTRN 9FT ADLT (ELECTROSURGICAL) ×2 IMPLANT
EVACUATOR MICROVAS BLADDER (UROLOGICAL SUPPLIES) IMPLANT
GLOVE BIOGEL M STRL SZ7.5 (GLOVE) ×3 IMPLANT
GOWN STRL REUS W/TWL LRG LVL3 (GOWN DISPOSABLE) ×7 IMPLANT
GUIDEWIRE ANG ZIPWIRE 038X150 (WIRE) IMPLANT
GUIDEWIRE STR DUAL SENSOR (WIRE) ×3 IMPLANT
KIT ASPIRATION TUBING (SET/KITS/TRAYS/PACK) IMPLANT
LOOPS RESECTOSCOPE DISP (ELECTROSURGICAL) ×2 IMPLANT
MANIFOLD NEPTUNE II (INSTRUMENTS) ×3 IMPLANT
NDL SAFETY ECLIPSE 18X1.5 (NEEDLE) IMPLANT
NEEDLE HYPO 18GX1.5 SHARP (NEEDLE) ×3
PACK CYSTO (CUSTOM PROCEDURE TRAY) ×3 IMPLANT
SYRINGE IRR TOOMEY STRL 70CC (SYRINGE) IMPLANT
TUBING CONNECTING 10 (TUBING) ×3 IMPLANT

## 2013-11-19 NOTE — Anesthesia Postprocedure Evaluation (Signed)
Anesthesia Post Note  Patient: Jeremy Kelley  Procedure(s) Performed: Procedure(s) (LRB): TRANSURETHRAL RESECTION OF BLADDER TUMOR (TURBT) (N/A) CYSTOSCOPY WITH BILATERAL RETROGRADE PYELOGRAM, (Bilateral)  Anesthesia type: General  Patient location: PACU  Post pain: Pain level controlled  Post assessment: Post-op Vital signs reviewed  Last Vitals: BP 164/62  Pulse 51  Temp(Src) 36.3 C (Oral)  Resp 15  Ht 5\' 10"  (1.778 m)  Wt 144 lb (65.318 kg)  BMI 20.66 kg/m2  SpO2 100%  Post vital signs: Reviewed  Level of consciousness: sedated  Complications: No apparent anesthesia complications

## 2013-11-19 NOTE — Anesthesia Preprocedure Evaluation (Signed)
Anesthesia Evaluation  Patient identified by MRN, date of birth, ID band Patient awake    Reviewed: Allergy & Precautions, H&P , NPO status , Patient's Chart, lab work & pertinent test results, reviewed documented beta blocker date and time   Airway Mallampati: II TM Distance: >3 FB Neck ROM: Full    Dental no notable dental hx. (+) Dental Advisory Given, Edentulous Upper   Pulmonary neg pulmonary ROS,  breath sounds clear to auscultation  Pulmonary exam normal       Cardiovascular hypertension, Pt. on medications and Pt. on home beta blockers - Peripheral Vascular Disease + dysrhythmias + Valvular Problems/Murmurs Rhythm:Regular Rate:Normal     Neuro/Psych Carotid artery stenosis negative psych ROS   GI/Hepatic negative GI ROS, (+) Hepatitis -, AH/O Hep A in 1960's   Endo/Other  negative endocrine ROS  Renal/GU Renal diseasenegative Renal ROS     Musculoskeletal negative musculoskeletal ROS (+)   Abdominal   Peds  Hematology negative hematology ROS (+)   Anesthesia Other Findings   Reproductive/Obstetrics                           Anesthesia Physical  Anesthesia Plan  ASA: III  Anesthesia Plan: General   Post-op Pain Management:    Induction: Intravenous  Airway Management Planned: LMA  Additional Equipment:   Intra-op Plan:   Post-operative Plan: Extubation in OR  Informed Consent: I have reviewed the patients History and Physical, chart, labs and discussed the procedure including the risks, benefits and alternatives for the proposed anesthesia with the patient or authorized representative who has indicated his/her understanding and acceptance.   Dental advisory given  Plan Discussed with: CRNA  Anesthesia Plan Comments:         Anesthesia Quick Evaluation

## 2013-11-19 NOTE — Progress Notes (Signed)
Patient ID: EBER FERRUFINO, male   DOB: [birth year], 80 y.o.   MRN: [id number]  Post-op note  Subjective: The patient is doing well.  No complaints.  Objective: Vital signs in last 24 hours: Temp:  [97.3 F (36.3 C)-97.6 F (36.4 C)] 97.3 F (36.3 C) (08/10 1718) Pulse Rate:  [53-57] 53 (08/10 1730) Resp:  [12-18] 18 (08/10 1730) BP: (135-154)/(59-63) 135/63 mmHg (08/10 1718) SpO2:  [100 %] 100 % (08/10 1730) Weight:  [65.318 kg (144 lb)] 65.318 kg (144 lb) (08/10 1320)  Intake/Output from previous day:   Intake/Output this shift: Total I/O In: 500 [I.V.:500] Out: -   Physical Exam:  General: Alert and oriented.  Lab Results: No results found for this basename: HGB, HCT,  in the last 72 hours  Assessment/Plan: POD#0   1) Continue to monitor   Pryor Curia. MD   LOS: 0 days   Samia Kukla,LES 11/19/2013, 5:37 PM

## 2013-11-19 NOTE — Transfer of Care (Signed)
Immediate Anesthesia Transfer of Care Note  Patient: Jeremy Kelley  Procedure(s) Performed: Procedure(s): TRANSURETHRAL RESECTION OF BLADDER TUMOR (TURBT) (N/A) CYSTOSCOPY WITH BILATERAL RETROGRADE PYELOGRAM, (Bilateral)  Patient Location: PACU  Anesthesia Type:General  Level of Consciousness: awake, oriented, patient cooperative, lethargic and responds to stimulation  Airway & Oxygen Therapy: Patient Spontanous Breathing and Patient connected to face mask oxygen  Post-op Assessment: Report given to PACU RN, Post -op Vital signs reviewed and stable and Patient moving all extremities  Post vital signs: Reviewed and stable  Complications: No apparent anesthesia complications

## 2013-11-19 NOTE — Discharge Instructions (Signed)
1. You may see some blood in the urine and may have some burning with urination for 48-72 hours. You also may notice that you have to urinate more frequently or urgently after your procedure which is normal.  °2. You should call should you develop an inability urinate, fever > 101, persistent nausea and vomiting that prevents you from eating or drinking to stay hydrated.  °

## 2013-11-19 NOTE — Progress Notes (Signed)
Upon patient's admission to the floor, he wanted to get up and go into the bathroom without assistance.  I explained to the patient that because he had just come from surgery, had recently come to the ED complaining of dizziness, has h/o of carotid artery stenosis that per hospital policy I could not leave him alone due to his high risk of falling.  He proceeded to yell at me and slammed the door shut on his IV tubing.  The nurse tech came in later and attempted to fix his IV tubing and patient was also verbally abusive to her and told her to get out.  I have placed patient on the bed alarm and requested that he call nursing staff if he needs to get up, and he has agreed.  Will continue to monitor patient.

## 2013-11-19 NOTE — Op Note (Signed)
Preoperative diagnosis: 1. Bladder tumors  2. History of bladder cancer  Postoperative diagnosis:  1. Bladder tumors 2. History of bladder cancer  Procedure:  1. Cystoscopy 2. Transurethral resection of bladder tumor (Four tumors, 0.5 cm to 0.8 cm in size) 3. Bilateral retrograde pyelography with interpretation  Surgeon: Pryor Curia. M.D.  Anesthesia: General  Complications: None  Intraoperative findings:  1. Bladder tumor: He was noted to have 4 small low-grade appearing papillary bladder tumors.  There was a 0.8 cm tumor located at the right anterior bladder neck, a small 0.5 cm tumor posteriorly and toward the left posterior wall of the bladder and there was another 0.5 cm tumor located toward the right dome. 2. 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  The right renal collecting system and ureter are also normal in appearance without filling defects or other abnormalities.  He was noted to have a few air bubbles in the right ureter which cleared upon monitoring contrast excretion after injection.  Retrograde pyelography was performed with Omnipaque contrast via a 6 French ureteral catheter.  EBL: Minimal  Specimens: 1. Bladder neck tumor 2. Posterior tumor 3. Left posterior tumor 4. Right dome tumor  Disposition of specimens: Pathology  Indication: Jeremy Kelley is a patient with a history of urothelial carcinoma of the bladder who was recently noted to have a recurrent papillary bladder tumor at the bladder neck on office cystoscopy.. After reviewing the management options for treatment, he elected to proceed with the above surgical procedure(s). We have discussed the potential benefits and risks of the procedure, side effects of the proposed treatment, the likelihood of the patient achieving the goals of the procedure, and any potential problems that might occur during  the procedure or recuperation. Informed consent has been obtained.  Description of procedure:  The patient was taken to the operating room and general anesthesia was induced.  The patient was placed in the dorsal lithotomy position, prepped and draped in the usual sterile fashion, and preoperative antibiotics were administered. A preoperative time-out was performed.   Cystourethroscopy was performed.  The patient's urethra was examined and was normal.  The prostate was surgically absent. There was noted to be a small papillary tumor measuring 0.8 cm towards the right anterior bladder neck.   The bladder was then systematically examined in its entirety. There was mild to moderate trabeculation throughout the bladder.  The ureteral orifices were in their expected anatomic locations.  Small papillary bladder tumors measuring 0.5 cm were located toward the right dome, posterior bladder, and left posterior bladder wall.  All tumors appeared low-grade and papillary.  Attention then turned to the right ureteral orifice and a ureteral catheter was used to intubate the ureteral orifice.  Omnipaque contrast was injected through the ureteral catheter and a retrograde pyelogram was performed with findings as dictated above.  Attention then turned to the left ureteral orifice and a ureteral catheter was used to intubate the ureteral orifice.  Omnipaque contrast was injected through the ureteral catheter and a retrograde pyelogram was performed with findings as dictated above.  The bladder was then re-examined after the resectoscope was placed.  The bladder neck tumor was able to be resected using the loop cutting resection.  The remaining tumors were removed with either the cutting loop or the transurethral cold cup biopsy forceps. Using loop cautery resection, the entire tumor was resected and removed for permanent pathologic analysis.  Hemostasis was then achieved with the loop cautery and the bladder was  emptied and reinspected with no further bleeding noted at the end of the procedure.    The bladder was then emptied and the procedure ended.  The patient appeared to tolerate the procedure well and without complications.  The patient was able to be awakened and transferred to the recovery unit in satisfactory condition.    Pryor Curia MD

## 2013-11-20 ENCOUNTER — Encounter (HOSPITAL_COMMUNITY): Payer: Self-pay | Admitting: Urology

## 2013-11-20 DIAGNOSIS — N476 Balanoposthitis: Secondary | ICD-10-CM | POA: Diagnosis not present

## 2013-11-20 DIAGNOSIS — Z7982 Long term (current) use of aspirin: Secondary | ICD-10-CM | POA: Diagnosis not present

## 2013-11-20 DIAGNOSIS — N529 Male erectile dysfunction, unspecified: Secondary | ICD-10-CM | POA: Diagnosis not present

## 2013-11-20 DIAGNOSIS — C679 Malignant neoplasm of bladder, unspecified: Secondary | ICD-10-CM | POA: Diagnosis not present

## 2013-11-20 DIAGNOSIS — Z79899 Other long term (current) drug therapy: Secondary | ICD-10-CM | POA: Diagnosis not present

## 2013-11-20 DIAGNOSIS — N3946 Mixed incontinence: Secondary | ICD-10-CM | POA: Diagnosis not present

## 2013-11-20 NOTE — Progress Notes (Signed)
Patient ID: Jeremy Kelley, male   DOB: [birth year], 80 y.o.   MRN: [id number]  1 Day Post-Op Subjective: Pt required catheterization last night.  400 cc returned. No other complaints.  Objective: Vital signs in last 24 hours: Temp:  [97.3 F (36.3 C)-98 F (36.7 C)] 97.8 F (36.6 C) (08/11 0526) Pulse Rate:  [48-57] 49 (08/11 0526) Resp:  [12-18] 16 (08/11 0526) BP: (125-171)/(47-64) 140/56 mmHg (08/11 0526) SpO2:  [95 %-100 %] 97 % (08/11 0526) Weight:  [65.318 kg (144 lb)] 65.318 kg (144 lb) (08/10 1320)  Intake/Output from previous day: 08/10 0701 - 08/11 0700 In: 900 [I.V.:700; IV Piggyback:200] Out: 1654 [Urine:1654] Intake/Output this shift:    Physical Exam:  General: Alert and oriented GU: Urine clear in catheter   Studies/Results: No results found.  Assessment/Plan: POD # 1 s/p TURBT - Catheter removed for voiding trial - D/C home   LOS: 1 day   Jeremy Kelley,LES 11/20/2013, 7:30 AM

## 2013-11-20 NOTE — Discharge Summary (Signed)
  Date of admission: 11/19/2013  Date of discharge: 11/20/2013  Admission diagnosis: Bladder cancer  Discharge diagnosis: Bladder cancer  History and Physical: For full details, please see admission history and physical. Briefly, Jeremy Kelley is a 80 y.o. year old patient with a history of urothelial carcinoma of the bladder and ureter. He was noted to have a recurrent tumor on office cystoscopy.   Hospital Course: He underwent cystoscopy and TURBT and was noted to have 4 bladder tumors that appeared small and papillary.  He was admitted for observation overnight as he does live alone.  He developed urinary retention postoperatively requiring catheterization.  He was able to pass a voiding trial on POD #1 and was discharged home in good condition.  Disposition: Home  Discharge instruction: The patient was instructed to be ambulatory but told to refrain from heavy lifting, strenuous activity.  Discharge medications:    Medication List    STOP taking these medications       aspirin EC 81 MG tablet      TAKE these medications       B-complex with vitamin C tablet  Take 1 tablet by mouth daily.     HYDROcodone-acetaminophen 5-325 MG per tablet  Commonly known as:  NORCO/VICODIN  Take 1-2 tablets by mouth every 6 (six) hours as needed.     losartan 25 MG tablet  Commonly known as:  COZAAR  Take 12.5 mg by mouth every morning.     methylcellulose 1 % ophthalmic solution  Commonly known as:  ARTIFICIAL TEARS  Place 1 drop into both eyes 2 (two) times daily.     metoprolol succinate 25 MG 24 hr tablet  Commonly known as:  TOPROL-XL  Take 25 mg by mouth at bedtime.     multivitamin tablet  Take 1 tablet by mouth daily with breakfast.     phenazopyridine 100 MG tablet  Commonly known as:  PYRIDIUM  Take 1 tablet (100 mg total) by mouth 3 (three) times daily as needed for pain (for burning).     pravastatin 20 MG tablet  Commonly known as:  PRAVACHOL  Take 1 tablet (20  mg total) by mouth at bedtime.     PRESERVISION/LUTEIN PO  Take 1 tablet by mouth daily.        Followup:      Follow-up Information   Follow up with [name], MD. (11/27/13 at 10 am)    Specialty:  Urology   Contact information:   [address] [place] [address] [phone]

## 2013-11-20 NOTE — Progress Notes (Signed)
Discussed d/c medications and instructions with pt and pt's son; pt verbalized understanding of medications and instructions after d/c and when to notify MD. IV d/c'd/removed. Pt d/c'd from unit with son, ambulated from unit with steady gait. Dominga Ferry, RN

## 2013-11-27 ENCOUNTER — Other Ambulatory Visit: Payer: Self-pay | Admitting: Dermatology

## 2013-11-27 DIAGNOSIS — C44721 Squamous cell carcinoma of skin of unspecified lower limb, including hip: Secondary | ICD-10-CM | POA: Diagnosis not present

## 2013-11-27 DIAGNOSIS — C679 Malignant neoplasm of bladder, unspecified: Secondary | ICD-10-CM | POA: Diagnosis not present

## 2013-11-27 DIAGNOSIS — R82998 Other abnormal findings in urine: Secondary | ICD-10-CM | POA: Diagnosis not present

## 2013-11-28 DIAGNOSIS — H31099 Other chorioretinal scars, unspecified eye: Secondary | ICD-10-CM | POA: Diagnosis not present

## 2013-11-28 DIAGNOSIS — H353190 Nonexudative age-related macular degeneration, unspecified eye, stage unspecified: Secondary | ICD-10-CM | POA: Diagnosis not present

## 2013-11-30 DIAGNOSIS — N476 Balanoposthitis: Secondary | ICD-10-CM | POA: Diagnosis not present

## 2013-11-30 DIAGNOSIS — R82998 Other abnormal findings in urine: Secondary | ICD-10-CM | POA: Diagnosis not present

## 2013-12-11 DIAGNOSIS — Z85828 Personal history of other malignant neoplasm of skin: Secondary | ICD-10-CM | POA: Diagnosis not present

## 2013-12-26 DIAGNOSIS — C679 Malignant neoplasm of bladder, unspecified: Secondary | ICD-10-CM | POA: Diagnosis not present

## 2013-12-31 ENCOUNTER — Other Ambulatory Visit: Payer: Self-pay | Admitting: Internal Medicine

## 2014-01-02 DIAGNOSIS — C679 Malignant neoplasm of bladder, unspecified: Secondary | ICD-10-CM | POA: Diagnosis not present

## 2014-01-04 ENCOUNTER — Ambulatory Visit (INDEPENDENT_AMBULATORY_CARE_PROVIDER_SITE_OTHER): Payer: Medicare Other | Admitting: Internal Medicine

## 2014-01-04 ENCOUNTER — Encounter: Payer: Self-pay | Admitting: Internal Medicine

## 2014-01-04 VITALS — BP 120/62 | HR 59 | Temp 98.0°F | Resp 18 | Ht 70.0 in | Wt 142.0 lb

## 2014-01-04 DIAGNOSIS — I351 Nonrheumatic aortic (valve) insufficiency: Secondary | ICD-10-CM

## 2014-01-04 DIAGNOSIS — I10 Essential (primary) hypertension: Secondary | ICD-10-CM | POA: Diagnosis not present

## 2014-01-04 DIAGNOSIS — C679 Malignant neoplasm of bladder, unspecified: Secondary | ICD-10-CM

## 2014-01-04 DIAGNOSIS — I359 Nonrheumatic aortic valve disorder, unspecified: Secondary | ICD-10-CM

## 2014-01-04 HISTORY — DX: Nonrheumatic aortic (valve) insufficiency: I35.1

## 2014-01-04 NOTE — Progress Notes (Signed)
Pre visit review using our clinic review tool, if applicable. No additional management support is needed unless otherwise documented below in the visit note. 

## 2014-01-04 NOTE — Patient Instructions (Addendum)
Acute bronchitis symptoms  are generally not helped by antibiotics.  Take over-the-counter expectorants and cough medications such as  Mucinex DM.  Call if there is no improvement in 5 to 7 days or if  you develop worsening cough, fever, or new symptoms, such as shortness of breath or chest pain.  Return in 6 months for follow-up I and other

## 2014-01-04 NOTE — Progress Notes (Signed)
Subjective:    Patient ID: Jeremy Kelley, male    DOB: 1926/10/12, 80 y.o.   MRN: [id number]  HPI  An 80 year old patient who presents with a ten-day history of chest congestion, cough, and nasal congestion.  He has been expectorating small amounts of yellow sputum. He is followed by urology admitted to bladder cancer and since September 16 has been undergoing instillation of BCG.  Patient handout suggested medical evaluation if he developed a cough. Denies any fever, shortness of breath, or wheezing  Past Medical History  Diagnosis Date  . History of prostate cancer 2001  . Hypertension   . Colon polyps   . Cataract   . Phimosis   . Carotid artery stenosis 11/2008    bilateral 40-59% stenosis  . Hepatitis     doesn't know which type 1969  . Hyperlipidemia   . Numbness     fingertips  . Urothelial carcinoma of left distal ureter     History   Social History  . Marital Status: Single    Spouse Name: N/A    Number of Children: N/A  . Years of Education: N/A   Occupational History  . Not on file.   Social History Main Topics  . Smoking status: Passive Smoke Exposure - Never Smoker    Types: Cigars  . Smokeless tobacco: Never Used     Comment: occasionally smokes a cigar  . Alcohol Use: 0.0 oz/week    6-7 Glasses of wine per week     Comment: 3-4 glasses of wine per week-no history of excessinve alcohol use  . Drug Use: No  . Sexual Activity: Not on file   Other Topics Concern  . Not on file   Social History Narrative   Patient is single, has never been married.  Does not have any children.  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   He drinks approximately 3-4 glasses of wine per week.  No history of excessive alcohol use.  Occasionally smokes a cigar.   Spends winters in [place]         Past Surgical History  Procedure Laterality Date  . Inguinal hernia repair    . Varicose vein surgery    . Other surgical history      s/p  laparoscopic prostate surgery  . Skin cancer excision      a/p skin cancer right foot-non melanoma  . Bladder suspension  08/18/10    [name]  . Eye surgery  09/24/10    left eye. "Retina surgery"  . Transurethral resection of bladder tumor  10/01/2011    Procedure: TRANSURETHRAL RESECTION OF BLADDER TUMOR (TURBT);  Surgeon: [name], MD;  Location: [facility];  Service: Urology;  Laterality: N/A;  . Esophageal biopsy  10/01/2011    Procedure: BIOPSY;  Surgeon: [name], MD;  Location: [facility];  Service: Urology;;  biopsy of bladder tumor  . Ureteroscopy  01/27/2012    Procedure: URETEROSCOPY;  Surgeon: [name], MD;  Location: [facility];  Service: Urology;  Laterality: Left;  CYSTO, Left RETROGRADE PYELOGRPHY, LEFT URETEROSCOPY   . Cystoscopy w/ retrogrades  01/27/2012    Procedure: CYSTOSCOPY WITH RETROGRADE PYELOGRAM;  Surgeon: [name], MD;  Location: [facility];  Service: Urology;  Laterality: Left;  . Hernia repair      x2  . Urinary sphincter implant    . Cystoscopy with retrograde pyelogram, ureteroscopy and stent placement Left 11/24/2012    Procedure: CYSTOSCOPY WITH left RETROGRADE  PYELOGRAM, bladder washings, ureteroscopy;  Surgeon: [name], MD;  Location: [facility];  Service: Urology;  Laterality: Left;  . Transurethral resection of bladder tumor N/A 11/19/2013    Procedure: TRANSURETHRAL RESECTION OF BLADDER TUMOR (TURBT);  Surgeon: [name], MD;  Location: [facility];  Service: Urology;  Laterality: N/A;  . Cystoscopy with retrograde pyelogram, ureteroscopy and stent placement Bilateral 11/19/2013    Procedure: [place],;  Surgeon: [name], MD;  Location: [facility];  Service: Urology;  Laterality: Bilateral;    No family history on file.  No Known Allergies  Current Outpatient Prescriptions on File Prior to Visit  Medication Sig Dispense Refill  . B Complex-C (B-COMPLEX WITH VITAMIN C) tablet Take 1 tablet by mouth daily.      [name]  losartan (COZAAR) 25 MG tablet TAKE 1 TABLET (25 MG TOTAL) BY MOUTH DAILY.  180 tablet  0  . methylcellulose (ARTIFICIAL TEARS) 1 % ophthalmic solution Place 1 drop into both eyes 2 (two) times daily.       . metoprolol succinate (TOPROL-XL) 25 MG 24 hr tablet Take 25 mg by mouth at bedtime.      . Multiple Vitamin (MULTIVITAMIN) tablet Take 1 tablet by mouth daily with breakfast.       . Multiple Vitamins-Minerals (PRESERVISION/LUTEIN PO) Take 1 tablet by mouth daily.      . pravastatin (PRAVACHOL) 20 MG tablet Take 1 tablet (20 mg total) by mouth at bedtime.  90 tablet  3   No current facility-administered medications on file prior to visit.    BP 120/62  Pulse 59  Temp(Src) 98 F (36.7 C) (Oral)  Resp 18  Ht 5\' 10"  (1.778 m)  Wt 142 lb (64.411 kg)  BMI 20.37 kg/m2  SpO2 98%     Review of Systems  Constitutional: Negative for fever, chills, appetite change and fatigue.  HENT: Positive for congestion, postnasal drip and rhinorrhea. Negative for dental problem, ear pain, hearing loss, sore throat, tinnitus, trouble swallowing and voice change.   Eyes: Negative for pain, discharge and visual disturbance.  Respiratory: Positive for cough. Negative for chest tightness, shortness of breath, wheezing and stridor.   Cardiovascular: Negative for chest pain, palpitations and leg swelling.  Gastrointestinal: Negative for nausea, vomiting, abdominal pain, diarrhea, constipation, blood in stool and abdominal distention.  Genitourinary: Negative for urgency, hematuria, flank pain, discharge, difficulty urinating and genital sores.  Musculoskeletal: Negative for arthralgias, back pain, gait problem, joint swelling, myalgias and neck stiffness.  Skin: Negative for rash.  Neurological: Negative for dizziness, syncope, speech difficulty, weakness, numbness and headaches.  Hematological: Negative for adenopathy. Does not bruise/bleed easily.  Psychiatric/Behavioral: Negative for behavioral problems  and dysphoric mood. The patient is not nervous/anxious.        Objective:   Physical Exam  Constitutional: He is oriented to person, place, and time. He appears well-developed.  HENT:  Head: Normocephalic.  Right Ear: External ear normal.  Left Ear: External ear normal.  Eyes: Conjunctivae and EOM are normal.  Neck: Normal range of motion.  Cardiovascular: Normal rate.   Murmur heard. Grade 3 over 6 diastolic murmur of aortic insufficiency  Pulmonary/Chest: Effort normal and breath sounds normal. No respiratory distress. He has no wheezes. He has no rales.  Abdominal: Bowel sounds are normal.  Musculoskeletal: Normal range of motion. He exhibits no edema and no tenderness.  Neurological: He is alert and oriented to person, place, and time.  Psychiatric: He has a normal mood and  affect. His behavior is normal.          Assessment & Plan:   Lower URI with cough.  Will treat symptomatically Bladder cancer continue BCG instillation and followup urology Aortic insufficiency.  Consider followup 2-D echo.  Asymptomatic  Recheck 6 months or as needed

## 2014-01-09 DIAGNOSIS — C679 Malignant neoplasm of bladder, unspecified: Secondary | ICD-10-CM | POA: Diagnosis not present

## 2014-01-11 ENCOUNTER — Encounter: Payer: Self-pay | Admitting: Internal Medicine

## 2014-01-15 ENCOUNTER — Other Ambulatory Visit: Payer: Self-pay | Admitting: Dermatology

## 2014-01-15 DIAGNOSIS — C44729 Squamous cell carcinoma of skin of left lower limb, including hip: Secondary | ICD-10-CM | POA: Diagnosis not present

## 2014-01-15 DIAGNOSIS — Z85828 Personal history of other malignant neoplasm of skin: Secondary | ICD-10-CM | POA: Diagnosis not present

## 2014-01-15 DIAGNOSIS — Z08 Encounter for follow-up examination after completed treatment for malignant neoplasm: Secondary | ICD-10-CM | POA: Diagnosis not present

## 2014-01-16 DIAGNOSIS — Z5111 Encounter for antineoplastic chemotherapy: Secondary | ICD-10-CM | POA: Diagnosis not present

## 2014-01-16 DIAGNOSIS — C674 Malignant neoplasm of posterior wall of bladder: Secondary | ICD-10-CM | POA: Diagnosis not present

## 2014-01-16 DIAGNOSIS — C671 Malignant neoplasm of dome of bladder: Secondary | ICD-10-CM | POA: Diagnosis not present

## 2014-01-23 DIAGNOSIS — C679 Malignant neoplasm of bladder, unspecified: Secondary | ICD-10-CM | POA: Diagnosis not present

## 2014-01-30 DIAGNOSIS — Z5111 Encounter for antineoplastic chemotherapy: Secondary | ICD-10-CM | POA: Diagnosis not present

## 2014-01-30 DIAGNOSIS — C679 Malignant neoplasm of bladder, unspecified: Secondary | ICD-10-CM | POA: Diagnosis not present

## 2014-03-21 ENCOUNTER — Ambulatory Visit (INDEPENDENT_AMBULATORY_CARE_PROVIDER_SITE_OTHER): Payer: Medicare Other | Admitting: *Deleted

## 2014-03-21 ENCOUNTER — Encounter: Payer: Self-pay | Admitting: Family Medicine

## 2014-03-21 ENCOUNTER — Ambulatory Visit (INDEPENDENT_AMBULATORY_CARE_PROVIDER_SITE_OTHER): Payer: Medicare Other | Admitting: Family Medicine

## 2014-03-21 VITALS — BP 130/72 | HR 64 | Temp 97.8°F | Wt 141.0 lb

## 2014-03-21 DIAGNOSIS — Z23 Encounter for immunization: Secondary | ICD-10-CM | POA: Diagnosis not present

## 2014-03-21 DIAGNOSIS — S39011A Strain of muscle, fascia and tendon of abdomen, initial encounter: Secondary | ICD-10-CM | POA: Diagnosis not present

## 2014-03-21 DIAGNOSIS — C44729 Squamous cell carcinoma of skin of left lower limb, including hip: Secondary | ICD-10-CM | POA: Diagnosis not present

## 2014-03-21 MED ORDER — METOPROLOL SUCCINATE ER 25 MG PO TB24: 25.0000 mg | ORAL_TABLET | Freq: Every day | ORAL | Status: DC

## 2014-03-21 MED ORDER — PRAVASTATIN SODIUM 20 MG PO TABS: 20.0000 mg | ORAL_TABLET | Freq: Every day | ORAL | Status: DC

## 2014-03-21 MED ORDER — LOSARTAN POTASSIUM 25 MG PO TABS: ORAL_TABLET | ORAL | Status: DC

## 2014-03-21 NOTE — Progress Notes (Signed)
Subjective:    Patient ID: Jeremy Kelley, male    DOB: [birth year], 80 y.o.   MRN: [id number]  HPI Patient sees a work in with some mild right inguinal and suprapubic pain. Onset several days ago. He thinks he may have strained this riding a cycle. He also kayaks frequently. He does have history of prior hernia repair he states 3 times previously most recently now 15 years ago in [place]. He also has bladder cancer and is undergoing BCG therapy and has follow-up with urologist tomorrow. No burning with urination. No fevers or chills. He has not noted any bulging or obvious hernia. Still staying quite active. No skin rashes. No adenopathy. No recent appetite or weight changes  Past Medical History  Diagnosis Date  . History of prostate cancer 2001  . Hypertension   . Colon polyps   . Cataract   . Phimosis   . Carotid artery stenosis 11/2008    bilateral 40-59% stenosis  . Hepatitis     doesn't know which type 1969  . Hyperlipidemia   . Numbness     fingertips  . Urothelial carcinoma of left distal ureter    Past Surgical History  Procedure Laterality Date  . Inguinal hernia repair    . Varicose vein surgery    . Other surgical history      s/p laparoscopic prostate surgery  . Skin cancer excision      a/p skin cancer right foot-non melanoma  . Bladder suspension  08/18/10    [name]  . Eye surgery  09/24/10    left eye. "Retina surgery"  . Transurethral resection of bladder tumor  10/01/2011    Procedure: TRANSURETHRAL RESECTION OF BLADDER TUMOR (TURBT);  Surgeon: [name], MD;  Location: [facility];  Service: Urology;  Laterality: N/A;  . Esophageal biopsy  10/01/2011    Procedure: BIOPSY;  Surgeon: [name], MD;  Location: [facility];  Service: Urology;;  biopsy of bladder tumor  . Ureteroscopy  01/27/2012    Procedure: URETEROSCOPY;  Surgeon: [name], MD;  Location: [facility];  Service: Urology;  Laterality: Left;  CYSTO, Left RETROGRADE PYELOGRPHY, LEFT URETEROSCOPY   .  Cystoscopy w/ retrogrades  01/27/2012    Procedure: CYSTOSCOPY WITH RETROGRADE PYELOGRAM;  Surgeon: [name], MD;  Location: [facility];  Service: Urology;  Laterality: Left;  . Hernia repair      x2  . Urinary sphincter implant    . Cystoscopy with retrograde pyelogram, ureteroscopy and stent placement Left 11/24/2012    Procedure: CYSTOSCOPY WITH left RETROGRADE PYELOGRAM, bladder washings, ureteroscopy;  Surgeon: [name], MD;  Location: [facility];  Service: Urology;  Laterality: Left;  . Transurethral resection of bladder tumor N/A 11/19/2013    Procedure: TRANSURETHRAL RESECTION OF BLADDER TUMOR (TURBT);  Surgeon: [name], MD;  Location: [facility];  Service: Urology;  Laterality: N/A;  . Cystoscopy with retrograde pyelogram, ureteroscopy and stent placement Bilateral 11/19/2013    Procedure: [place],;  Surgeon: [name], MD;  Location: [facility];  Service: Urology;  Laterality: Bilateral;    reports that he has been passively smoking Cigars.  He has never used smokeless tobacco. He reports that he drinks alcohol. He reports that he does not use illicit drugs. family history is not on file. No Known Allergies    Review of Systems  Constitutional: Negative for fever, chills, appetite change and unexpected weight change.  Respiratory: Negative for cough and shortness of breath.  Cardiovascular: Negative for chest pain.  Gastrointestinal: Negative for abdominal pain.  Genitourinary: Negative for dysuria.  Hematological: Negative for adenopathy.       Objective:   Physical Exam  Constitutional: He appears well-developed and well-nourished.  Cardiovascular: Normal rate and regular rhythm.   Pulmonary/Chest: Effort normal and breath sounds normal. No respiratory distress. He has no wheezes. He has no rales.  Genitourinary:  Genitourinary exam reveals no evidence for recurrent hernia. No inguinal adenopathy.          Assessment & Plan:    Right lower abdomen/inguinal pain. Question oblique strain. No evidence for recurrent hernia. Reassurance given. Observe for now. Follow-up with primary if symptoms persist

## 2014-03-21 NOTE — Progress Notes (Signed)
Pre visit review using our clinic review tool, if applicable. No additional management support is needed unless otherwise documented below in the visit note. 

## 2014-03-21 NOTE — Patient Instructions (Signed)
Suspect you have oblique muscle strain No evidence for hernia at this time.

## 2014-03-22 ENCOUNTER — Other Ambulatory Visit: Payer: Self-pay | Admitting: Dermatology

## 2014-03-22 DIAGNOSIS — C678 Malignant neoplasm of overlapping sites of bladder: Secondary | ICD-10-CM | POA: Diagnosis not present

## 2014-03-22 DIAGNOSIS — C44729 Squamous cell carcinoma of skin of left lower limb, including hip: Secondary | ICD-10-CM | POA: Diagnosis not present

## 2014-03-22 DIAGNOSIS — C669 Malignant neoplasm of unspecified ureter: Secondary | ICD-10-CM | POA: Diagnosis not present

## 2014-06-13 ENCOUNTER — Other Ambulatory Visit: Payer: Self-pay | Admitting: Family Medicine

## 2014-08-12 ENCOUNTER — Other Ambulatory Visit: Payer: Self-pay | Admitting: Physician Assistant

## 2014-08-12 DIAGNOSIS — C44722 Squamous cell carcinoma of skin of right lower limb, including hip: Secondary | ICD-10-CM | POA: Diagnosis not present

## 2014-08-13 ENCOUNTER — Ambulatory Visit (INDEPENDENT_AMBULATORY_CARE_PROVIDER_SITE_OTHER)
Admission: RE | Admit: 2014-08-13 | Discharge: 2014-08-13 | Disposition: A | Discharge status: Home / Self Care | Payer: Medicare Other | Source: Ambulatory Visit | Attending: Adult Health | Admitting: Adult Health

## 2014-08-13 ENCOUNTER — Encounter: Payer: Self-pay | Admitting: Adult Health

## 2014-08-13 ENCOUNTER — Ambulatory Visit (INDEPENDENT_AMBULATORY_CARE_PROVIDER_SITE_OTHER): Payer: Medicare Other | Admitting: Adult Health

## 2014-08-13 VITALS — BP 130/84 | Temp 98.2°F | Ht 70.0 in | Wt 137.3 lb

## 2014-08-13 DIAGNOSIS — R0781 Pleurodynia: Secondary | ICD-10-CM

## 2014-08-13 DIAGNOSIS — S2232XA Fracture of one rib, left side, initial encounter for closed fracture: Secondary | ICD-10-CM | POA: Diagnosis not present

## 2014-08-13 NOTE — Progress Notes (Signed)
   Subjective:    Patient ID: Jeremy Kelley, male    DOB: 04/20/1926, 81 y.o.   MRN: [id number]  HPI  81 year old Caucasian male who presents to the office today for injury to the left rib cage. 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 He only complains of discomfort when he takes a deep breath, and that feels like a " quick stab". He is not taking any medication for the discomfort at this time.   Per patient " I just want an x-ray so I can know if it is broken."   Denies any hematoma, SOB, or CP   Review of Systems  All other systems reviewed and are negative.      Objective:   Physical Exam  Constitutional: He is oriented to person, place, and time. He appears well-developed and well-nourished. No distress.  Cardiovascular: Normal rate, regular rhythm and normal heart sounds.  Exam reveals no gallop and no friction rub.   No murmur heard. Pulmonary/Chest: Effort normal and breath sounds normal. No respiratory distress. He has no wheezes. He has no rales. He exhibits no tenderness.  No bruising, redness or evidence of trauma  Abdominal: Soft. He exhibits no distension and no mass. There is no tenderness. There is no rebound and no guarding.  Musculoskeletal: Normal range of motion. He exhibits no edema or tenderness.  Neurological: He is alert and oriented to person, place, and time.  Skin: Skin is warm and dry. No rash noted. He is not diaphoretic. No erythema. No pallor.  Psychiatric: He has a normal mood and affect. His behavior is normal. Judgment and thought content normal.  Nursing note and vitals reviewed.      Assessment & Plan:  1. Rib pain on left side - DG Chest 2 View; Future - Will inform patient of results - Take Tylenol or Motrin for pain relief - Report to the ER with any SOB, CP or any other concern - Follow up as needed.

## 2014-08-13 NOTE — Patient Instructions (Signed)
It was great meeting you today! Please go to the [place] office and have an x ray done of your chest. I will let you know the results. If you have any episodes of shortness of breath, chest pain or difficulty breathing, please go to the ER. Follow up as needed.

## 2014-08-13 NOTE — Progress Notes (Signed)
Pre visit review using our clinic review tool, if applicable. No additional management support is needed unless otherwise documented below in the visit note. 

## 2014-08-14 ENCOUNTER — Ambulatory Visit: Payer: PRIVATE HEALTH INSURANCE | Admitting: Internal Medicine

## 2014-08-14 DIAGNOSIS — C678 Malignant neoplasm of overlapping sites of bladder: Secondary | ICD-10-CM | POA: Diagnosis not present

## 2014-08-14 DIAGNOSIS — C61 Malignant neoplasm of prostate: Secondary | ICD-10-CM | POA: Diagnosis not present

## 2014-08-14 DIAGNOSIS — C669 Malignant neoplasm of unspecified ureter: Secondary | ICD-10-CM | POA: Diagnosis not present

## 2014-08-16 ENCOUNTER — Other Ambulatory Visit: Payer: Self-pay | Admitting: Internal Medicine

## 2014-08-21 DIAGNOSIS — H3531 Nonexudative age-related macular degeneration: Secondary | ICD-10-CM | POA: Diagnosis not present

## 2014-08-21 DIAGNOSIS — H35342 Macular cyst, hole, or pseudohole, left eye: Secondary | ICD-10-CM | POA: Diagnosis not present

## 2014-08-22 ENCOUNTER — Telehealth: Payer: Self-pay | Admitting: Internal Medicine

## 2014-08-22 ENCOUNTER — Encounter: Payer: Self-pay | Admitting: Adult Health

## 2014-08-22 ENCOUNTER — Ambulatory Visit (INDEPENDENT_AMBULATORY_CARE_PROVIDER_SITE_OTHER): Payer: Medicare Other | Admitting: Adult Health

## 2014-08-22 VITALS — BP 104/76 | Temp 98.6°F | Ht 70.0 in | Wt 136.8 lb

## 2014-08-22 DIAGNOSIS — R0781 Pleurodynia: Secondary | ICD-10-CM | POA: Diagnosis not present

## 2014-08-22 DIAGNOSIS — R1310 Dysphagia, unspecified: Secondary | ICD-10-CM | POA: Diagnosis not present

## 2014-08-22 NOTE — Progress Notes (Signed)
Subjective:    Patient ID: Jeremy Kelley, male    DOB: 03/28/1927, 81 y.o.   MRN: [id number]  HPI  Jeremy Kelley presents to the office today for follow up after suffering a broken rib on his left side three weeks ago. He would like to know when he can get back to kayaking. His pain is better but he continues to have some pain when he takes a deep breath.   He also has the complaint of dysphagia when he swallows dry foods and pills. This has been going on for "years" has not been getting worse, but he would like a referral to GI for endoscopy. On occasion he has the feeling as he is choking. Nothing makes it better or exacerbates that symptoms.   Review of Systems  All other systems reviewed and are negative.  Past Medical History  Diagnosis Date  . History of prostate cancer 2001  . Hypertension   . Colon polyps   . Cataract   . Phimosis   . Carotid artery stenosis 11/2008    bilateral 40-59% stenosis  . Hepatitis     doesn't know which type 1969  . Hyperlipidemia   . Numbness     fingertips  . Urothelial carcinoma of left distal ureter     History   Social History  . Marital Status: Single    Spouse Name: N/A  . Number of Children: N/A  . Years of Education: N/A   Occupational History  . Not on file.   Social History Main Topics  . Smoking status: Passive Smoke Exposure - Never Smoker    Types: Cigars  . Smokeless tobacco: Never Used     Comment: occasionally smokes a cigar  . Alcohol Use: 0.0 oz/week    6-7 Glasses of wine per week     Comment: 3-4 glasses of wine per week-no history of excessinve alcohol use  . Drug Use: No  . Sexual Activity: Not on file   Other Topics Concern  . Not on file   Social History Narrative   Patient is single, has never been married.  Does not have any children.  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   He drinks approximately 3-4 glasses of wine per week.  No history of excessive alcohol use.   Occasionally smokes a cigar.   Spends winters in [place]         Past Surgical History  Procedure Laterality Date  . Inguinal hernia repair    . Varicose vein surgery    . Other surgical history      s/p laparoscopic prostate surgery  . Skin cancer excision      a/p skin cancer right foot-non melanoma  . Bladder suspension  08/18/10    [name]  . Eye surgery  09/24/10    left eye. "Retina surgery"  . Transurethral resection of bladder tumor  10/01/2011    Procedure: TRANSURETHRAL RESECTION OF BLADDER TUMOR (TURBT);  Surgeon: [name], MD;  Location: [facility];  Service: Urology;  Laterality: N/A;  . Esophageal biopsy  10/01/2011    Procedure: BIOPSY;  Surgeon: [name], MD;  Location: [facility];  Service: Urology;;  biopsy of bladder tumor  . Ureteroscopy  01/27/2012    Procedure: URETEROSCOPY;  Surgeon: [name], MD;  Location: [facility];  Service: Urology;  Laterality: Left;  CYSTO, Left RETROGRADE PYELOGRPHY, LEFT URETEROSCOPY   . Cystoscopy w/ retrogrades  01/27/2012    Procedure: CYSTOSCOPY  WITH RETROGRADE PYELOGRAM;  Surgeon: [name], MD;  Location: [facility];  Service: Urology;  Laterality: Left;  . Hernia repair      x2  . Urinary sphincter implant    . Cystoscopy with retrograde pyelogram, ureteroscopy and stent placement Left 11/24/2012    Procedure: CYSTOSCOPY WITH left RETROGRADE PYELOGRAM, bladder washings, ureteroscopy;  Surgeon: [name], MD;  Location: [facility];  Service: Urology;  Laterality: Left;  . Transurethral resection of bladder tumor N/A 11/19/2013    Procedure: TRANSURETHRAL RESECTION OF BLADDER TUMOR (TURBT);  Surgeon: [name], MD;  [facility];  Service: Urology;  Laterality: N/A;  . Cystoscopy with retrograde pyelogram, ureteroscopy and stent placement Bilateral 11/19/2013    Procedure: [place],;  Surgeon: [name], MD;  [facility]: [facility];  Service: Urology;  Laterality: Bilateral;    No family history  on file.  No Known Allergies  Current Outpatient Prescriptions on File Prior to Visit  Medication Sig Dispense Refill  . B Complex-C (B-COMPLEX WITH VITAMIN C) tablet Take 1 tablet by mouth daily.    [name] losartan (COZAAR) 25 MG tablet TAKE 1 TABLET (25 MG TOTAL) BY MOUTH DAILY. 90 tablet 1  . methylcellulose (ARTIFICIAL TEARS) 1 % ophthalmic solution Place 1 drop into both eyes 2 (two) times daily.     . metoprolol succinate (TOPROL-XL) 25 MG 24 hr tablet TAKE 1 TABLET BY MOUTH AT BEDTIME 90 tablet 0  . Multiple Vitamin (MULTIVITAMIN) tablet Take 1 tablet by mouth daily with breakfast.     . Multiple Vitamins-Minerals (PRESERVISION/LUTEIN PO) Take 1 tablet by mouth daily.    . pravastatin (PRAVACHOL) 20 MG tablet TAKE 1 TABLET BY MOUTH AT BEDTIME 90 tablet 2   No current facility-administered medications on file prior to visit.    BP 104/76 mmHg  Temp(Src) 98.6 F (37 C) (Oral)  Ht 5\' 10"  (1.778 m)  Wt 136 lb 12.8 oz (62.052 kg)  BMI 19.63 kg/m2       Objective:   Physical Exam  Constitutional: He is oriented to person, place, and time. He appears well-developed and well-nourished. No distress.  Neck: Normal range of motion. Neck supple. No JVD present. No tracheal deviation present. No thyromegaly present.  Cardiovascular: Normal rate, regular rhythm, normal heart sounds and intact distal pulses.  Exam reveals no gallop.   No murmur heard. Pulmonary/Chest: Effort normal and breath sounds normal. No stridor. No respiratory distress. He has no wheezes. He has no rales. He exhibits no tenderness.  Musculoskeletal: Normal range of motion. He exhibits no edema or tenderness.  Pain in left ribs when deep inspiration  Lymphadenopathy:    He has no cervical adenopathy.  Neurological: He is alert and oriented to person, place, and time.  Skin: Skin is warm and dry. No rash noted. He is not diaphoretic. No erythema. No pallor.  No bruising   Psychiatric: He has a normal mood and  affect. His behavior is normal. Judgment and thought content normal.  Nursing note and vitals reviewed.      Assessment & Plan:  1. Dysphagia - Information given on dysphagia diet - Ambulatory referral to Gastroenterology  2. Rib pain on left side - Continue to take tylenol or motrin for pain - Informed that most rib fractures heal in 6 weeks - He can trial kayaking, if he has pain then he is to stop and wait another week. Increase exercise gradually.  - Follow up as needed

## 2014-08-22 NOTE — Telephone Encounter (Signed)
Spoke with patient and he states he will be out of town until June 13th. Scheduled on 09/24/14 at 2:00 PM with Dr. Olevia Perches.

## 2014-08-22 NOTE — Progress Notes (Signed)
Pre visit review using our clinic review tool, if applicable. No additional management support is needed unless otherwise documented below in the visit note. 

## 2014-08-22 NOTE — Patient Instructions (Addendum)
A referal to the GI doctors has been placed, they will call you to schedule an appointment.   You can increase your activity level, but this should be done gradually. Most rib fractures heal within six weeks. Many patients are able to resume daily activities much sooner. Continue to take ibuprofen or tylenol for pain.  Dysphagia Swallowing problems (dysphagia) occur when solids and liquids seem to stick in your throat on the way down to your stomach, or the food takes longer to get to the stomach. Other symptoms include regurgitating food, noises coming from the throat, chest discomfort with swallowing, and a feeling of fullness or the feeling of something being stuck in your throat when swallowing. When blockage in your throat is complete, it may be associated with drooling. CAUSES  Problems with swallowing may occur because of problems with the muscles. The food cannot be propelled in the usual manner into your stomach. You may have ulcers, scar tissue, or inflammation in the tube down which food travels from your mouth to your stomach (esophagus), which blocks food from passing normally into the stomach. Causes of inflammation include:  Acid reflux from your stomach into your esophagus.  Infection.  Radiation treatment for cancer.  Medicines taken without enough fluids to wash them down into your stomach. You may have nerve problems that prevent signals from being sent to the muscles of your esophagus to contract and move your food down to your stomach. Globus pharyngeus is a relatively common problem in which there is a sense of an obstruction or difficulty in swallowing, without any physical abnormalities of the swallowing passages being found. This problem usually improves over time with reassurance and testing to rule out other causes. DIAGNOSIS Dysphagia can be diagnosed and its cause can be determined by tests in which you swallow a white substance that helps illuminate the inside of your  throat (contrast medium) while X-rays are taken. Sometimes a flexible telescope that is inserted down your throat (endoscopy) to look at your esophagus and stomach is used. TREATMENT   If the dysphagia is caused by acid reflux or infection, medicines may be used.  If the dysphagia is caused by problems with your swallowing muscles, swallowing therapy may be used to help you strengthen your swallowing muscles.  If the dysphagia is caused by a blockage or mass, procedures to remove the blockage may be done. HOME CARE INSTRUCTIONS  Try to eat soft food that is easier to swallow and check your weight on a daily basis to be sure that it is not decreasing.  Be sure to drink liquids when sitting upright (not lying down). SEEK MEDICAL CARE IF:  You are losing weight because you are unable to swallow.  You are coughing when you drink liquids (aspiration).  You are coughing up partially digested food. SEEK IMMEDIATE MEDICAL CARE IF:  You are unable to swallow your own saliva .  You are having shortness of breath or a fever, or both.  You have a hoarse voice along with difficulty swallowing. MAKE SURE YOU:  Understand these instructions.  Will watch your condition.  Will get help right away if you are not doing well or get worse. Document Released: 03/26/2000 Document Revised: 08/13/2013 Document Reviewed: 09/15/2012 [facility] Patient Information 2015 [place], [place]. This information is not intended to replace advice given to you by your health care provider. Make sure you discuss any questions you have with your health care provider.

## 2014-09-20 ENCOUNTER — Telehealth: Payer: Self-pay | Admitting: Adult Health

## 2014-09-20 ENCOUNTER — Ambulatory Visit (INDEPENDENT_AMBULATORY_CARE_PROVIDER_SITE_OTHER)
Admission: RE | Admit: 2014-09-20 | Discharge: 2014-09-20 | Disposition: A | Discharge status: Home / Self Care | Payer: Medicare Other | Source: Ambulatory Visit | Attending: Adult Health | Admitting: Adult Health

## 2014-09-20 ENCOUNTER — Ambulatory Visit (INDEPENDENT_AMBULATORY_CARE_PROVIDER_SITE_OTHER): Payer: Medicare Other | Admitting: Adult Health

## 2014-09-20 ENCOUNTER — Encounter: Payer: Self-pay | Admitting: Adult Health

## 2014-09-20 VITALS — BP 110/80 | Temp 98.6°F | Ht 70.0 in | Wt 135.5 lb

## 2014-09-20 DIAGNOSIS — R202 Paresthesia of skin: Secondary | ICD-10-CM | POA: Diagnosis not present

## 2014-09-20 DIAGNOSIS — M79671 Pain in right foot: Secondary | ICD-10-CM | POA: Diagnosis not present

## 2014-09-20 DIAGNOSIS — R5383 Other fatigue: Secondary | ICD-10-CM | POA: Diagnosis not present

## 2014-09-20 DIAGNOSIS — M7989 Other specified soft tissue disorders: Secondary | ICD-10-CM | POA: Diagnosis not present

## 2014-09-20 DIAGNOSIS — R200 Anesthesia of skin: Secondary | ICD-10-CM

## 2014-09-20 LAB — CBC WITH DIFFERENTIAL/PLATELET
Basophils Absolute: 0 10*3/uL (ref 0.0–0.1)
Basophils Relative: 0.4 % (ref 0.0–3.0)
Eosinophils Absolute: 0.3 10*3/uL (ref 0.0–0.7)
Eosinophils Relative: 3.3 % (ref 0.0–5.0)
HCT: 43.3 % (ref 39.0–52.0)
Hemoglobin: 14.2 g/dL (ref 13.0–17.0)
LYMPHS ABS: 2 10*3/uL (ref 0.7–4.0)
LYMPHS PCT: 23.7 % (ref 12.0–46.0)
MCHC: 32.9 g/dL (ref 30.0–36.0)
MCV: 94.9 fl (ref 78.0–100.0)
Monocytes Absolute: 0.5 10*3/uL (ref 0.1–1.0)
Monocytes Relative: 5.8 % (ref 3.0–12.0)
Neutro Abs: 5.8 10*3/uL (ref 1.4–7.7)
Neutrophils Relative %: 66.8 % (ref 43.0–77.0)
PLATELETS: 199 10*3/uL (ref 150.0–400.0)
RBC: 4.56 Mil/uL (ref 4.22–5.81)
RDW: 13.8 % (ref 11.5–15.5)
WBC: 8.6 10*3/uL (ref 4.0–10.5)

## 2014-09-20 LAB — BASIC METABOLIC PANEL
BUN: 16 mg/dL (ref 6–23)
CALCIUM: 9.8 mg/dL (ref 8.4–10.5)
CO2: 30 meq/L (ref 19–32)
Chloride: 103 mEq/L (ref 96–112)
Creatinine, Ser: 1.09 mg/dL (ref 0.40–1.50)
GFR: 68.23 mL/min (ref >60.00)
Glucose, Bld: 88 mg/dL (ref 70–99)
POTASSIUM: 4.5 meq/L (ref 3.5–5.1)
Sodium: 139 mEq/L (ref 135–145)

## 2014-09-20 LAB — VITAMIN B12: VITAMIN B 12: 730 pg/mL (ref 211–911)

## 2014-09-20 NOTE — Progress Notes (Signed)
Pre visit review using our clinic review tool, if applicable. No additional management support is needed unless otherwise documented below in the visit note. 

## 2014-09-20 NOTE — Progress Notes (Signed)
Subjective:    Patient ID: Jeremy Kelley, male    DOB: 1926/05/07, 81 y.o.   MRN: [id number]  HPI  Jeremy Kelley comes to the office today for numbness and tingling in bilateral lower extremities. This is has been going on for " many months now." When he wakes up in the morning it is if " my feet are stiff and have been in a box".  Throughout the day he has episodes of numbness and tingling.   More recently he has had pain on the top of his right foot. He has not tried anything for the pain in the past. Per patient " the pain isn't that bad that I need to use medication."  He is also complaining of more fatigue than what he has experienced in the past. " I have to take a 15 minute nap almost every day. I feel a little more fatigued than what I am used too."  His pain from the rib fracture has resolved.  Review of Systems  Constitutional: Positive for fatigue.  HENT: Negative.   Eyes: Negative.   Respiratory: Negative.   Cardiovascular: Negative.  Negative for leg swelling.  Gastrointestinal: Negative.   Endocrine: Negative.   Genitourinary: Negative.   Musculoskeletal: Positive for joint swelling and arthralgias. Negative for gait problem.  Skin: Negative.   Allergic/Immunologic: Negative.   Neurological: Negative.   Hematological: Negative.   Psychiatric/Behavioral: Negative.   All other systems reviewed and are negative.  Past Medical History  Diagnosis Date  . History of prostate cancer 2001  . Hypertension   . Colon polyps   . Cataract   . Phimosis   . Carotid artery stenosis 11/2008    bilateral 40-59% stenosis  . Hepatitis     doesn't know which type 1969  . Hyperlipidemia   . Numbness     fingertips  . Urothelial carcinoma of left distal ureter   . Diverticulosis     History   Social History  . Marital Status: Single    Spouse Name: N/A  . Number of Children: N/A  . Years of Education: N/A   Occupational History  . Not on file.   Social History Main  Topics  . Smoking status: Passive Smoke Exposure - Never Smoker    Types: Cigars  . Smokeless tobacco: Never Used     Comment: occasionally smokes a cigar  . Alcohol Use: 0.0 oz/week    6-7 Glasses of wine per week     Comment: 3-4 glasses of wine per week-no history of excessinve alcohol use  . Drug Use: No  . Sexual Activity: Not on file   Other Topics Concern  . Not on file   Social History Narrative   Patient is single, has never been married.  Does not have any children.  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   He drinks approximately 3-4 glasses of wine per week.  No history of excessive alcohol use.  Occasionally smokes a cigar.   Spends winters in [place]         Past Surgical History  Procedure Laterality Date  . Inguinal hernia repair    . Varicose vein surgery    . Other surgical history      s/p laparoscopic prostate surgery  . Skin cancer excision      a/p skin cancer right foot-non melanoma  . Bladder suspension  08/18/10    [name]  . Eye surgery  09/24/10  left eye. "Retina surgery"  . Transurethral resection of bladder tumor  10/01/2011    Procedure: TRANSURETHRAL RESECTION OF BLADDER TUMOR (TURBT);  Surgeon: [name], MD;  Location: [facility];  Service: Urology;  Laterality: N/A;  . Esophageal biopsy  10/01/2011    Procedure: BIOPSY;  Surgeon: [name], MD;  Location: [facility];  Service: Urology;;  biopsy of bladder tumor  . Ureteroscopy  01/27/2012    Procedure: URETEROSCOPY;  Surgeon: [name], MD;  Location: [facility];  Service: Urology;  Laterality: Left;  CYSTO, Left RETROGRADE PYELOGRPHY, LEFT URETEROSCOPY   . Cystoscopy w/ retrogrades  01/27/2012    Procedure: CYSTOSCOPY WITH RETROGRADE PYELOGRAM;  Surgeon: [name], MD;  Location: [facility];  Service: Urology;  Laterality: Left;  . Hernia repair      x2  . Urinary sphincter implant    . Cystoscopy with retrograde pyelogram, ureteroscopy and stent placement Left 11/24/2012     Procedure: CYSTOSCOPY WITH left RETROGRADE PYELOGRAM, bladder washings, ureteroscopy;  Surgeon: [name], MD;  Location: [facility];  Service: Urology;  Laterality: Left;  . Transurethral resection of bladder tumor N/A 11/19/2013    Procedure: TRANSURETHRAL RESECTION OF BLADDER TUMOR (TURBT);  Surgeon: [name], MD;  Location: [facility];  Service: Urology;  Laterality: N/A;  . Cystoscopy with retrograde pyelogram, ureteroscopy and stent placement Bilateral 11/19/2013    Procedure: [place],;  Surgeon: [name], MD;  Location: [facility];  Service: Urology;  Laterality: Bilateral;    No family history on file.  No Known Allergies  Current Outpatient Prescriptions on File Prior to Visit  Medication Sig Dispense Refill  . B Complex-C (B-COMPLEX WITH VITAMIN C) tablet Take 1 tablet by mouth daily.    [name] losartan (COZAAR) 25 MG tablet TAKE 1 TABLET (25 MG TOTAL) BY MOUTH DAILY. 90 tablet 1  . methylcellulose (ARTIFICIAL TEARS) 1 % ophthalmic solution Place 1 drop into both eyes 2 (two) times daily.     . metoprolol succinate (TOPROL-XL) 25 MG 24 hr tablet TAKE 1 TABLET BY MOUTH AT BEDTIME 90 tablet 0  . Multiple Vitamin (MULTIVITAMIN) tablet Take 1 tablet by mouth daily with breakfast.     . Multiple Vitamins-Minerals (PRESERVISION/LUTEIN PO) Take 1 tablet by mouth daily.    . pravastatin (PRAVACHOL) 20 MG tablet TAKE 1 TABLET BY MOUTH AT BEDTIME 90 tablet 2   No current facility-administered medications on file prior to visit.    BP 110/80 mmHg  Temp(Src) 98.6 F (37 C) (Oral)  Ht 5\' 10"  (1.778 m)  Wt 135 lb 8 oz (61.462 kg)  BMI 19.44 kg/m2       Objective:   Physical Exam  Constitutional: He is oriented to person, place, and time. He appears well-developed and well-nourished. No distress.  Neck: Normal range of motion. Neck supple.  Cardiovascular: Normal rate, regular rhythm, normal heart sounds and intact distal pulses.  Exam reveals no gallop  and no friction rub.   No murmur heard. Pulmonary/Chest: Effort normal and breath sounds normal. No respiratory distress. He has no wheezes. He has no rales. He exhibits no tenderness.  Musculoskeletal: Normal range of motion. He exhibits edema and tenderness.  Slight tenderness with palpation to top of right foot at approx 3rd metatarsal . Trace swelling. No redness or warmth.   Neurological: He is alert and oriented to person, place, and time.  He has decreased sensation in right foot. Mostly from mid sole up towards the toes. - with monofilament.  Skin: Skin is warm and dry. No rash noted. He is not diaphoretic. No erythema. No pallor.  Psychiatric: He has a normal mood and affect. His behavior is normal. Judgment and thought content normal.  Nursing note and vitals reviewed.      Assessment & Plan:  1. Numbness and tingling of both legs B12 def. Vs. Arthritis vs. Neuropathy.  - Vitamin S96 - Basic metabolic panel - Will follow up with patient regarding labs and xray - Follow up as needed  2. Right foot pain - DG Foot Complete Right; Future  3. Other fatigue - Vitamin B12 - CBC with Differential/Platelet - Basic metabolic panel

## 2014-09-20 NOTE — Patient Instructions (Addendum)
I will follow up with you regarding your blood work and x ray.   It was so nice seeing you today! Please let me know if I can do anything else for you.

## 2014-09-20 NOTE — Telephone Encounter (Signed)
Updated patient on his lab results and x ray. Advised him to wear a hard sole shoe or CAM boot and stay off his feet for one month. He refused. Offered ice and ibuprofen.

## 2014-09-22 ENCOUNTER — Other Ambulatory Visit: Payer: Self-pay | Admitting: Adult Health

## 2014-09-23 DIAGNOSIS — Z85828 Personal history of other malignant neoplasm of skin: Secondary | ICD-10-CM | POA: Diagnosis not present

## 2014-09-23 DIAGNOSIS — L570 Actinic keratosis: Secondary | ICD-10-CM | POA: Diagnosis not present

## 2014-09-23 DIAGNOSIS — D485 Neoplasm of uncertain behavior of skin: Secondary | ICD-10-CM | POA: Diagnosis not present

## 2014-09-23 DIAGNOSIS — Z08 Encounter for follow-up examination after completed treatment for malignant neoplasm: Secondary | ICD-10-CM | POA: Diagnosis not present

## 2014-09-24 ENCOUNTER — Encounter: Payer: Self-pay | Admitting: Internal Medicine

## 2014-09-24 ENCOUNTER — Ambulatory Visit (INDEPENDENT_AMBULATORY_CARE_PROVIDER_SITE_OTHER): Payer: Medicare Other | Admitting: Internal Medicine

## 2014-09-24 VITALS — BP 132/60 | HR 60 | Ht 70.0 in | Wt 137.2 lb

## 2014-09-24 DIAGNOSIS — R1310 Dysphagia, unspecified: Secondary | ICD-10-CM

## 2014-09-24 NOTE — Progress Notes (Signed)
Jeremy Kelley 1927/03/24 [id number]  Note: This dictation was prepared with Dragon digital system. Any transcriptional errors that result from this procedure are unintentional.    History of Present Illness: This is a 81 year old white male with gradual onset of  dysphagia to pills and liquids as well as to occasional solids. A friend has noticed that he has trouble swallowing when they eat out. He also sometimes coughs. On several occasions he had to cough up a large pill. We have seen him in the past for screening colonoscopy in June 2009 which showed extensive universal diverticulosis. Because of his age he has no recall exam. He has no lower GI complaints. Patient denies history of reflux, heartburn.    Past Medical History  Diagnosis Date  . History of prostate cancer 2001  . Hypertension   . Colon polyps   . Cataract   . Phimosis   . Carotid artery stenosis 11/2008    bilateral 40-59% stenosis  . Hepatitis     doesn't know which type 1969  . Hyperlipidemia   . Numbness     fingertips  . Urothelial carcinoma of left distal ureter   . Diverticulosis     Past Surgical History  Procedure Laterality Date  . Inguinal hernia repair    . Varicose vein surgery    . Other surgical history      s/p laparoscopic prostate surgery  . Skin cancer excision      a/p skin cancer right foot-non melanoma  . Bladder suspension  08/18/10    [name]  . Eye surgery  09/24/10    left eye. "Retina surgery"  . Transurethral resection of bladder tumor  10/01/2011    Procedure: TRANSURETHRAL RESECTION OF BLADDER TUMOR (TURBT);  Surgeon: [name], MD;  Location: [facility];  Service: Urology;  Laterality: N/A;  . Esophageal biopsy  10/01/2011    Procedure: BIOPSY;  Surgeon: [name], MD;  Location: [facility];  Service: Urology;;  biopsy of bladder tumor  . Ureteroscopy  01/27/2012    Procedure: URETEROSCOPY;  Surgeon: [name], MD;  Location: [facility];  Service: Urology;  Laterality: Left;   CYSTO, Left RETROGRADE PYELOGRPHY, LEFT URETEROSCOPY   . Cystoscopy w/ retrogrades  01/27/2012    Procedure: CYSTOSCOPY WITH RETROGRADE PYELOGRAM;  Surgeon: [name], MD;  Location: [facility];  Service: Urology;  Laterality: Left;  . Hernia repair      x2  . Urinary sphincter implant    . Cystoscopy with retrograde pyelogram, ureteroscopy and stent placement Left 11/24/2012    Procedure: CYSTOSCOPY WITH left RETROGRADE PYELOGRAM, bladder washings, ureteroscopy;  Surgeon: [name], MD;  Location: [facility];  Service: Urology;  Laterality: Left;  . Transurethral resection of bladder tumor N/A 11/19/2013    Procedure: TRANSURETHRAL RESECTION OF BLADDER TUMOR (TURBT);  Surgeon: [name], MD;  Location: [facility];  Service: Urology;  Laterality: N/A;  . Cystoscopy with retrograde pyelogram, ureteroscopy and stent placement Bilateral 11/19/2013    Procedure: [place],;  Surgeon: [name], MD;  Location: [facility];  Service: Urology;  Laterality: Bilateral;    No Known Allergies  Family history and social history have been reviewed.  Review of Systems:   The remainder of the 10 point ROS is negative except as outlined in the H&P  Physical Exam: General Appearance thin, in no distress, normal voice , no hoarseness or cough Eyes  Non icteric  HEENT  Non traumatic, normocephalic  Mouth No lesion, tongue  papillated, no cheilosis Neck Supple without adenopathy, thyroid not enlarged, no carotid bruits, no JVD Lungs Clear to auscultation bilaterally COR Normal S1, normal S2, regular rhythm, no murmur, quiet precordium Abdomen soft. Scaphoid. Normoactive bowel sounds. No tenderness Rectal not done Extremities  No pedal edema Skin No lesions Neurological Alert and oriented x 3 Psychological Normal mood and affect  Assessment and Plan:   82 year old white male with  almost daily occurrence of dysphagia to pills and mostly liquids as well as saliva and some  solid food. His symptoms are suggestive  presbyesophagus or esophageal dysmotility. May need to rule out Zenker's diverticulum or aspiration. He certainly has cough at times but as a rule his food always passes. We will start with barium esophagram with the esophagram. It is less likely that we will find a stricture.  But if we do so, he will likely need an endoscopy with dilatation. Incidentally he has no previous history of gastroesophageal reflux be will not at this point started him on PPI or acid reducer but we'll await results of his barium study    [name] 09/24/2014

## 2014-09-24 NOTE — Patient Instructions (Addendum)
You have been scheduled for a Barium Esophogram at [facility] (1st floor of the hospital) on 10/02/14 at 930 am. Please arrive 15 minutes prior to your appointment for registration. Make certain not to have anything to eat or drink 6 hours prior to your test. If you need to reschedule for any reason, please contact radiology at [phone] to do so. __________________________________________________________________ A barium swallow is an examination that concentrates on views of the esophagus. This tends to be a double contrast exam (barium and two liquids which, when combined, create a gas to distend the wall of the oesophagus) or single contrast (non-ionic iodine based). The study is usually tailored to your symptoms so a good history is essential. Attention is paid during the study to the form, structure and configuration of the esophagus, looking for functional disorders (such as aspiration, dysphagia, achalasia, motility and reflux) EXAMINATION You may be asked to change into a gown, depending on the type of swallow being performed. A radiologist and radiographer will perform the procedure. The radiologist will advise you of the type of contrast selected for your procedure and direct you during the exam. You will be asked to stand, sit or lie in several different positions and to hold a small amount of fluid in your mouth before being asked to swallow while the imaging is performed .In some instances you may be asked to swallow barium coated marshmallows to assess the motility of a solid food bolus. The exam can be recorded as a digital or video fluoroscopy procedure. POST PROCEDURE It will take 1-2 days for the barium to pass through your system. To facilitate this, it is important, unless otherwise directed, to increase your fluids for the next 24-48hrs and to resume your normal diet.  This test typically takes about 30 minutes to perform. ___  Dr Shawna Orleans,   [place],PA_______________________________________________________________________________

## 2014-09-25 DIAGNOSIS — H35361 Drusen (degenerative) of macula, right eye: Secondary | ICD-10-CM | POA: Diagnosis not present

## 2014-09-25 DIAGNOSIS — H3531 Nonexudative age-related macular degeneration: Secondary | ICD-10-CM | POA: Diagnosis not present

## 2014-09-25 DIAGNOSIS — H35371 Puckering of macula, right eye: Secondary | ICD-10-CM | POA: Diagnosis not present

## 2014-09-25 DIAGNOSIS — H35372 Puckering of macula, left eye: Secondary | ICD-10-CM | POA: Diagnosis not present

## 2014-10-02 ENCOUNTER — Ambulatory Visit (INDEPENDENT_AMBULATORY_CARE_PROVIDER_SITE_OTHER): Payer: Medicare Other | Admitting: Adult Health

## 2014-10-02 ENCOUNTER — Ambulatory Visit (HOSPITAL_COMMUNITY)
Admission: RE | Admit: 2014-10-02 | Discharge: 2014-10-02 | Disposition: A | Discharge status: Home / Self Care | Payer: Medicare Other | Source: Ambulatory Visit | Attending: Internal Medicine | Admitting: Internal Medicine

## 2014-10-02 ENCOUNTER — Encounter: Payer: Self-pay | Admitting: Adult Health

## 2014-10-02 ENCOUNTER — Ambulatory Visit (HOSPITAL_COMMUNITY): Payer: PRIVATE HEALTH INSURANCE

## 2014-10-02 VITALS — BP 130/70 | HR 60 | Temp 98.9°F | Wt 136.0 lb

## 2014-10-02 DIAGNOSIS — K224 Dyskinesia of esophagus: Secondary | ICD-10-CM | POA: Insufficient documentation

## 2014-10-02 DIAGNOSIS — Z09 Encounter for follow-up examination after completed treatment for conditions other than malignant neoplasm: Secondary | ICD-10-CM

## 2014-10-02 DIAGNOSIS — R1310 Dysphagia, unspecified: Secondary | ICD-10-CM

## 2014-10-02 DIAGNOSIS — R200 Anesthesia of skin: Secondary | ICD-10-CM

## 2014-10-02 DIAGNOSIS — R208 Other disturbances of skin sensation: Secondary | ICD-10-CM | POA: Diagnosis not present

## 2014-10-02 NOTE — Progress Notes (Signed)
Pre visit review using our clinic review tool, if applicable. No additional management support is needed unless otherwise documented below in the visit note. 

## 2014-10-02 NOTE — Progress Notes (Signed)
Subjective:    Patient ID: Jeremy Kelley, male    DOB: 1926-12-03, 81 y.o.   MRN: [id number]  HPI  Jeremy Kelley presents to the office today for follow up regarding his right foot. I last saw him on 09/20/2014 for pain in his right foot, x -ray showed a small healing stress fracture. He endorses that his pain is better especially when he wears a compression sock.   His complaint today is that he has numbness and tingling in his right foot when he wakes up in the morning. After he starts walking the numbness and tingling goes away.   He also endorses that he had a barium swallow today because he has been finding that he is having increased difficulty swallowing certain foods. The barium swallow showed :  IMPRESSION: Moderate aspiration with only minimal cough reflex.  Esophageal dysmotility without stricture or mass.  He will follow up with GI regarding these results.    He has no other complaints at this time and he will be leaving for [place] for a month.   Review of Systems  HENT: Negative.   Eyes: Negative.   Respiratory: Negative.   Cardiovascular: Negative.   Gastrointestinal: Negative.   Endocrine: Negative.   Genitourinary: Negative.   Musculoskeletal: Positive for arthralgias.  Skin: Negative.   Allergic/Immunologic: Negative.   Neurological: Positive for numbness.  Psychiatric/Behavioral: Negative.   All other systems reviewed and are negative.  Past Medical History  Diagnosis Date  . History of prostate cancer 2001  . Hypertension   . Colon polyps   . Cataract   . Phimosis   . Carotid artery stenosis 11/2008    bilateral 40-59% stenosis  . Hepatitis     doesn't know which type 1969  . Hyperlipidemia   . Numbness     fingertips  . Urothelial carcinoma of left distal ureter   . Diverticulosis     History   Social History  . Marital Status: Single    Spouse Name: N/A  . Number of Children: 0  . Years of Education: N/A   Occupational History  .  Retired    Social History Main Topics  . Smoking status: Passive Smoke Exposure - Never Smoker    Types: Cigars  . Smokeless tobacco: Never Used     Comment: occasionally smokes a cigar  . Alcohol Use: 0.0 oz/week    6-7 Glasses of wine per week     Comment: 3-4 glasses of wine per week-no history of excessinve alcohol use  . Drug Use: No  . Sexual Activity: Not on file   Other Topics Concern  . Not on file   Social History Narrative   Patient is single, has never been married.  Does not have any children.  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   He drinks approximately 3-4 glasses of wine per week.  No history of excessive alcohol use.  Occasionally smokes a cigar.   Spends winters in [place]         Past Surgical History  Procedure Laterality Date  . Inguinal hernia repair    . Varicose vein surgery    . Other surgical history      s/p laparoscopic prostate surgery  . Skin cancer excision      a/p skin cancer right foot-non melanoma  . Bladder suspension  08/18/10    [name]  . Eye surgery  09/24/10    left eye. "Retina surgery"  . Transurethral  resection of bladder tumor  10/01/2011    Procedure: TRANSURETHRAL RESECTION OF BLADDER TUMOR (TURBT);  Surgeon: [name], MD;  Location: [facility];  Service: Urology;  Laterality: N/A;  . Esophageal biopsy  10/01/2011    Procedure: BIOPSY;  Surgeon: [name], MD;  Location: [facility];  Service: Urology;;  biopsy of bladder tumor  . Ureteroscopy  01/27/2012    Procedure: URETEROSCOPY;  Surgeon: [name], MD;  Location: [facility];  Service: Urology;  Laterality: Left;  CYSTO, Left RETROGRADE PYELOGRPHY, LEFT URETEROSCOPY   . Cystoscopy w/ retrogrades  01/27/2012    Procedure: CYSTOSCOPY WITH RETROGRADE PYELOGRAM;  Surgeon: [name], MD;  Location: [facility];  Service: Urology;  Laterality: Left;  . Hernia repair      x2  . Urinary sphincter implant    . Cystoscopy with retrograde pyelogram, ureteroscopy and  stent placement Left 11/24/2012    Procedure: CYSTOSCOPY WITH left RETROGRADE PYELOGRAM, bladder washings, ureteroscopy;  Surgeon: [name], MD;  [facility]: [facility];  Service: Urology;  Laterality: Left;  . Transurethral resection of bladder tumor N/A 11/19/2013    Procedure: TRANSURETHRAL RESECTION OF BLADDER TUMOR (TURBT);  Surgeon: [name], MD;  [facility];  Service: Urology;  Laterality: N/A;  . Cystoscopy with retrograde pyelogram, ureteroscopy and stent placement Bilateral 11/19/2013    Procedure: [place],;  Surgeon: [name], MD;  [facility];  Service: Urology;  Laterality: Bilateral;    Family History  Problem Relation Age of Onset  . Colon cancer Neg Hx   . Colon polyps Neg Hx     No Known Allergies  Current Outpatient Prescriptions on File Prior to Visit  Medication Sig Dispense Refill  . B Complex-C (B-COMPLEX WITH VITAMIN C) tablet Take 1 tablet by mouth daily.    [name] doxycycline (VIBRAMYCIN) 100 MG capsule Take 100 mg by mouth daily.     [name] losartan (COZAAR) 25 MG tablet TAKE 1 TABLET (25 MG TOTAL) BY MOUTH DAILY. 90 tablet 1  . methylcellulose (ARTIFICIAL TEARS) 1 % ophthalmic solution Place 1 drop into both eyes 2 (two) times daily.     . metoprolol succinate (TOPROL-XL) 25 MG 24 hr tablet TAKE 1 TABLET BY MOUTH AT BEDTIME 90 tablet 0  . Multiple Vitamin (MULTIVITAMIN) tablet Take 1 tablet by mouth daily with breakfast.     . Multiple Vitamins-Minerals (PRESERVISION/LUTEIN PO) Take 1 tablet by mouth daily.    . pravastatin (PRAVACHOL) 20 MG tablet TAKE 1 TABLET BY MOUTH AT BEDTIME 90 tablet 2   No current facility-administered medications on file prior to visit.    BP 130/70 mmHg  Pulse 60  Temp(Src) 98.9 F (37.2 C)  Wt 136 lb (61.689 kg)        Objective:   Physical Exam  Constitutional: He is oriented to person, place, and time. He appears well-developed and well-nourished. No distress.  HENT:  Head:  Normocephalic and atraumatic.  Right Ear: External ear normal.  Left Ear: External ear normal.  Nose: Nose normal.  Mouth/Throat: Oropharynx is clear and moist.  Eyes: Conjunctivae are normal. Pupils are equal, round, and reactive to light. Right eye exhibits no discharge. Left eye exhibits no discharge. No scleral icterus.  Neck: Normal range of motion. Neck supple.  Cardiovascular: Normal rate, regular rhythm and intact distal pulses.  Exam reveals no gallop and no friction rub.   Murmur heard. Pulmonary/Chest: Effort normal and breath sounds normal. No respiratory distress. He has no wheezes. He  has no rales. He exhibits no tenderness.  Abdominal: Soft. Bowel sounds are normal. He exhibits no distension and no mass. There is no tenderness. There is no rebound and no guarding.  Musculoskeletal: Normal range of motion. He exhibits tenderness (right great toe from stress fracture). He exhibits no edema.  Lymphadenopathy:    He has no cervical adenopathy.  Neurological: He is alert and oriented to person, place, and time. He has normal reflexes. He displays normal reflexes. No cranial nerve deficit. He exhibits normal muscle tone. Coordination normal.  Has minimal sensation loss in bottom of right foot with monofilament test.   Skin: Skin is warm and dry. No rash noted. He is not diaphoretic. No erythema. No pallor.  Psychiatric: He has a normal mood and affect. His behavior is normal. Judgment and thought content normal.  Nursing note and vitals reviewed.      Assessment & Plan:  1. Follow up - Follow up after return from [place] - Follow up as needed - Continue to eat healthy and exercise as much as possible.  - Wear a compression sock to help with pain to right foot.    2. Lower extremity numbness - Monofilament test demonstrated minor sensation loss in right foot.  - He does not want additional testing done at this time - He will trial sleeping with leg elevated on pillow and/or  wearing compression stockings to bed.  - Follow up as needed

## 2014-10-03 ENCOUNTER — Other Ambulatory Visit: Payer: Self-pay | Admitting: *Deleted

## 2014-10-03 DIAGNOSIS — R1314 Dysphagia, pharyngoesophageal phase: Secondary | ICD-10-CM

## 2014-10-04 ENCOUNTER — Ambulatory Visit: Payer: PRIVATE HEALTH INSURANCE | Admitting: Adult Health

## 2014-10-04 ENCOUNTER — Other Ambulatory Visit (HOSPITAL_COMMUNITY): Payer: Self-pay | Admitting: Internal Medicine

## 2014-10-04 DIAGNOSIS — R1314 Dysphagia, pharyngoesophageal phase: Secondary | ICD-10-CM

## 2014-10-08 ENCOUNTER — Ambulatory Visit (HOSPITAL_COMMUNITY)
Admission: RE | Admit: 2014-10-08 | Discharge: 2014-10-08 | Disposition: A | Discharge status: Home / Self Care | Payer: Medicare Other | Source: Ambulatory Visit | Attending: Internal Medicine | Admitting: Internal Medicine

## 2014-10-08 DIAGNOSIS — K224 Dyskinesia of esophagus: Secondary | ICD-10-CM | POA: Diagnosis not present

## 2014-10-08 DIAGNOSIS — R1314 Dysphagia, pharyngoesophageal phase: Secondary | ICD-10-CM

## 2014-10-08 DIAGNOSIS — R1310 Dysphagia, unspecified: Secondary | ICD-10-CM | POA: Diagnosis not present

## 2014-10-08 DIAGNOSIS — R05 Cough: Secondary | ICD-10-CM | POA: Diagnosis not present

## 2014-10-08 NOTE — Procedures (Signed)
Objective Swallowing Evaluation: Other (Comment)  Patient Details  Name: Jeremy Kelley MRN: [id number] Date of Birth: 05-29-1926  Today's Date: 10/08/2014 Time: SLP Start Time (ACUTE ONLY): 1415-SLP Stop Time (ACUTE ONLY): 1458 SLP Time Calculation (min) (ACUTE ONLY): 43 min  Past Medical History:  Past Medical History  Diagnosis Date  . History of prostate cancer 2001  . Hypertension   . Colon polyps   . Cataract   . Phimosis   . Carotid artery stenosis 11/2008    bilateral 40-59% stenosis  . Hepatitis     doesn't know which type 1969  . Hyperlipidemia   . Numbness     fingertips  . Urothelial carcinoma of left distal ureter   . Diverticulosis    Past Surgical History:  Past Surgical History  Procedure Laterality Date  . Inguinal hernia repair    . Varicose vein surgery    . Other surgical history      s/p laparoscopic prostate surgery  . Skin cancer excision      a/p skin cancer right foot-non melanoma  . Bladder suspension  08/18/10    [name]  . Eye surgery  09/24/10    left eye. "Retina surgery"  . Transurethral resection of bladder tumor  10/01/2011    Procedure: TRANSURETHRAL RESECTION OF BLADDER TUMOR (TURBT);  Surgeon: [name], MD;  [facility];  Service: Urology;  Laterality: N/A;  . Esophageal biopsy  10/01/2011    Procedure: BIOPSY;  Surgeon: [name], MD;  [facility];  Service: Urology;;  biopsy of bladder tumor  . Ureteroscopy  01/27/2012    Procedure: URETEROSCOPY;  Surgeon: [name], MD;  [facility];  Service: Urology;  Laterality: Left;  CYSTO, Left RETROGRADE PYELOGRPHY, LEFT URETEROSCOPY   . Cystoscopy w/ retrogrades  01/27/2012    Procedure: CYSTOSCOPY WITH RETROGRADE PYELOGRAM;  Surgeon: [name], MD;  [facility];  Service: Urology;  Laterality: Left;  . Hernia repair      x2  . Urinary sphincter implant    . Cystoscopy with retrograde pyelogram, ureteroscopy and stent placement Left 11/24/2012    Procedure:  CYSTOSCOPY WITH left RETROGRADE PYELOGRAM, bladder washings, ureteroscopy;  Surgeon: [name], MD;  [facility];  Service: Urology;  Laterality: Left;  . Transurethral resection of bladder tumor N/A 11/19/2013    Procedure: TRANSURETHRAL RESECTION OF BLADDER TUMOR (TURBT);  Surgeon: [name], MD;  [facility];  Service: Urology;  Laterality: N/A;  . Cystoscopy with retrograde pyelogram, ureteroscopy and stent placement Bilateral 11/19/2013    Procedure: [place],;  Surgeon: [name], MD;  [facility];  Service: Urology;  Laterality: Bilateral;   HPI:  Other Pertinent Information: Pt is an 81 yo male referred by Dr Olevia Perches for [facility] due to aspiration during esophagram 10/02/14.  PMH + for prostate cancer, HTN, colon polyps, carotid artery stenosis, HLD, numbness in fingertips, diverticulosis, urotehlital carcinoma of left distal ureter, phimosis, cataract.  Pt reports issues with excessive secretions/saliva and some drooling x3 months- denies other medical changes, gait disturbances during said time.  Pt admits to issues with coughing some during meals - on liquids.  He denies pneumonias nor requiring heimlich manuever.      No Data Recorded  Assessment / Plan / Recommendation CHL IP CLINICAL IMPRESSIONS 10/08/2014  Therapy Diagnosis Moderate pharyngeal phase dysphagia;Mild oral phase dysphagia  Clinical Impression Minimal oral and moderate pharyngeal dysphagia with sensorimotor deficits.  Pt with delayed swallow initiation and  decreased oral bolus cohesion resulting in premature spillage of boluses into pharynx.  Delayed swallow reflex allowed OVERT aspiration of nectar liquid. Chin tuck posture very effective in protecting airway with liquids when conducted fully.  Partial chin tuck allowed mild penetration of thin liquids- tested for pt social comfort.  Pt did not aspirate or penetrate with solids in head neutral posture.  Advised pt to tuck  his chin with all bites/sips for maximal airway protection- although pt reports apprehension in conducting in social situation.    Recommend pt keep tongue base expectoration strong to aid clearance of vallecular space as needed.   Extensive education completed with pt using diagrams, live video, written precautions.  Further advised pt to use extra caution with intake when ill due to ongoing SILENT aspiration.    ? source of dyphagia/drooling/excessive secretions - recommend consider neuro consult to establish source of dysphagia.       CHL IP TREATMENT RECOMMENDATION 10/08/2014  Treatment Recommendations No treatment recommended at this time     CHL IP DIET RECOMMENDATION 10/08/2014  SLP Diet Recommendations Age appropriate regular solids;Thin  Liquid Administration via (None)  Medication Administration Whole meds with puree  Compensations Small sips/bites;Slow rate;Chin tuck;Use straw to facilitate chin tuck  Postural Changes and/or Swallow Maneuvers Chin tuck     CHL IP OTHER RECOMMENDATIONS 10/08/2014  Recommended Consults Other consider neuro refera        Oral Care Recommendations Oral care BID  Other Recommendations Other (Comment)         CHL IP REASON FOR REFERRAL 10/08/2014  Reason for Referral Objectively evaluate swallowing function     CHL IP ORAL PHASE 10/08/2014  Oral Phase Impaired      CHL IP PHARYNGEAL PHASE 10/08/2014  Pharyngeal Phase Impaired  Pharyngeal Comment deep chin tuck posture prevents aspiration, partial chin tuck not as effective and allows trace penetration of liquids      CHL IP CERVICAL ESOPHAGEAL PHASE 10/08/2014  Cervical Esophageal Phase WFL    CHL IP GO 10/08/2014  Functional Assessment Tool Used MBS, clinical judgement  Functional Limitations Swallowing  Swallow Current Status (F4239) CJ  Swallow Goal Status (R3202) [place] Discharge Status [phone]) Michaell Cowing, [place] [facility] [phone]

## 2014-11-14 ENCOUNTER — Encounter: Payer: Self-pay | Admitting: Adult Health

## 2014-11-14 ENCOUNTER — Ambulatory Visit (INDEPENDENT_AMBULATORY_CARE_PROVIDER_SITE_OTHER): Payer: Medicare Other | Admitting: Adult Health

## 2014-11-14 ENCOUNTER — Telehealth: Payer: Self-pay | Admitting: Adult Health

## 2014-11-14 VITALS — BP 128/80 | Temp 98.5°F | Ht 70.0 in | Wt 137.8 lb

## 2014-11-14 DIAGNOSIS — R42 Dizziness and giddiness: Secondary | ICD-10-CM

## 2014-11-14 LAB — CBC WITH DIFFERENTIAL/PLATELET
BASOS PCT: 0.4 % (ref 0.0–3.0)
Basophils Absolute: 0 10*3/uL (ref 0.0–0.1)
EOS ABS: 0.3 10*3/uL (ref 0.0–0.7)
EOS PCT: 3 % (ref 0.0–5.0)
HEMATOCRIT: 40.4 % (ref 39.0–52.0)
HEMOGLOBIN: 13.6 g/dL (ref 13.0–17.0)
LYMPHS ABS: 1.6 10*3/uL (ref 0.7–4.0)
Lymphocytes Relative: 18.6 % (ref 12.0–46.0)
MCHC: 33.7 g/dL (ref 30.0–36.0)
MCV: 93.9 fl (ref 78.0–100.0)
MONOS PCT: 5.7 % (ref 3.0–12.0)
Monocytes Absolute: 0.5 10*3/uL (ref 0.1–1.0)
NEUTROS PCT: 72.3 % (ref 43.0–77.0)
Neutro Abs: 6.1 10*3/uL (ref 1.4–7.7)
PLATELETS: 205 10*3/uL (ref 150.0–400.0)
RBC: 4.3 Mil/uL (ref 4.22–5.81)
RDW: 13.9 % (ref 11.5–15.5)
WBC: 8.4 10*3/uL (ref 4.0–10.5)

## 2014-11-14 LAB — BASIC METABOLIC PANEL
BUN: 20 mg/dL (ref 6–23)
CO2: 30 mEq/L (ref 19–32)
Calcium: 9.6 mg/dL (ref 8.4–10.5)
Chloride: 103 mEq/L (ref 96–112)
Creatinine, Ser: 1.09 mg/dL (ref 0.40–1.50)
GFR: 68.2 mL/min (ref >60.00)
Glucose, Bld: 99 mg/dL (ref 70–99)
POTASSIUM: 4.7 meq/L (ref 3.5–5.1)
Sodium: 139 mEq/L (ref 135–145)

## 2014-11-14 NOTE — Patient Instructions (Signed)
Please follow up with Dr. Olevia Perches.   We do not need to change your blood pressure medication right now.   Start monitoring your blood pressure in the morning and when you get dizzy. I am sure it is from dehydration.

## 2014-11-14 NOTE — Telephone Encounter (Signed)
Left vm message on patients phone with lab results.

## 2014-11-14 NOTE — Progress Notes (Signed)
Subjective:    Patient ID: Jeremy Kelley, male    DOB: [birth year], 81 y.o.   MRN: [id number]  HPI  80 year old pleasant male who presents to the office with multiple complaints.   1. Dizziness. He recently got back from a drip to [place] and endorsed having an episode of dizziness on the air plane. He was not drinking alcohol but was not drinking water either. He had another episode of dizziness while walking [place] the stairs to the subway in [place] as well as an occasion when getting up from bed in the morning. He is not checking his blood pressures at home. Denies any pain or stiffness in lower extremities. Denies any swelling or warmth in calf. Denies any blurred vision, lightheadedness or headaches.     Review of Systems  Constitutional: Negative.   HENT: Negative.   Respiratory: Negative.   Cardiovascular: Negative.   Musculoskeletal: Negative.   Neurological: Positive for dizziness. Negative for light-headedness and headaches.  All other systems reviewed and are negative.  Past Medical History  Diagnosis Date  . History of prostate cancer 2001  . Hypertension   . Colon polyps   . Cataract   . Phimosis   . Carotid artery stenosis 11/2008    bilateral 40-59% stenosis  . Hepatitis     doesn't know which type 1969  . Hyperlipidemia   . Numbness     fingertips  . Urothelial carcinoma of left distal ureter   . Diverticulosis     History   Social History  . Marital Status: Single    Spouse Name: N/A  . Number of Children: 0  . Years of Education: N/A   Occupational History  . Retired    Social History Main Topics  . Smoking status: Passive Smoke Exposure - Never Smoker    Types: Cigars  . Smokeless tobacco: Never Used     Comment: occasionally smokes a cigar  . Alcohol Use: 0.0 oz/week    6-7 Glasses of wine per week     Comment: 3-4 glasses of wine per week-no history of excessinve alcohol use  . Drug Use: No  . Sexual Activity: Not on file    Other Topics Concern  . Not on file   Social History Narrative   Patient is single, has never been married.  Does not have any children.  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   He drinks approximately 3-4 glasses of wine per week.  No history of excessive alcohol use.  Occasionally smokes a cigar.   Spends winters in [place]         Past Surgical History  Procedure Laterality Date  . Inguinal hernia repair    . Varicose vein surgery    . Other surgical history      s/p laparoscopic prostate surgery  . Skin cancer excision      a/p skin cancer right foot-non melanoma  . Bladder suspension  08/18/10    [name]  . Eye surgery  09/24/10    left eye. "Retina surgery"  . Transurethral resection of bladder tumor  10/01/2011    Procedure: TRANSURETHRAL RESECTION OF BLADDER TUMOR (TURBT);  Surgeon: [name], MD;  [facility];  Service: Urology;  Laterality: N/A;  . Esophageal biopsy  10/01/2011    Procedure: BIOPSY;  Surgeon: [name], MD;  [facility];  Service: Urology;;  biopsy of bladder tumor  . Ureteroscopy  01/27/2012  Procedure: URETEROSCOPY;  Surgeon: [name], MD;  [facility];  Service: Urology;  Laterality: Left;  CYSTO, Left RETROGRADE PYELOGRPHY, LEFT URETEROSCOPY   . Cystoscopy w/ retrogrades  01/27/2012    Procedure: CYSTOSCOPY WITH RETROGRADE PYELOGRAM;  Surgeon: [name], MD;  [facility];  Service: Urology;  Laterality: Left;  . Hernia repair      x2  . Urinary sphincter implant    . Cystoscopy with retrograde pyelogram, ureteroscopy and stent placement Left 11/24/2012    Procedure: CYSTOSCOPY WITH left RETROGRADE PYELOGRAM, bladder washings, ureteroscopy;  Surgeon: [name], MD;  [facility];  Service: Urology;  Laterality: Left;  . Transurethral resection of bladder tumor N/A 11/19/2013    Procedure: TRANSURETHRAL RESECTION OF BLADDER TUMOR (TURBT);  Surgeon: [name], MD;  [facility];  Service:  Urology;  Laterality: N/A;  . Cystoscopy with retrograde pyelogram, ureteroscopy and stent placement Bilateral 11/19/2013    Procedure: [place],;  Surgeon: [name], MD;  [facility];  Service: Urology;  Laterality: Bilateral;    Family History  Problem Relation Age of Onset  . Colon cancer Neg Hx   . Colon polyps Neg Hx     No Known Allergies  Current Outpatient Prescriptions on File Prior to Visit  Medication Sig Dispense Refill  . B Complex-C (B-COMPLEX WITH VITAMIN C) tablet Take 1 tablet by mouth daily.    [name] losartan (COZAAR) 25 MG tablet TAKE 1 TABLET (25 MG TOTAL) BY MOUTH DAILY. 90 tablet 1  . methylcellulose (ARTIFICIAL TEARS) 1 % ophthalmic solution Place 1 drop into both eyes 2 (two) times daily.     . metoprolol succinate (TOPROL-XL) 25 MG 24 hr tablet TAKE 1 TABLET BY MOUTH AT BEDTIME 90 tablet 0  . Multiple Vitamin (MULTIVITAMIN) tablet Take 1 tablet by mouth daily with breakfast.     . Multiple Vitamins-Minerals (PRESERVISION/LUTEIN PO) Take 1 tablet by mouth daily.    . pravastatin (PRAVACHOL) 20 MG tablet TAKE 1 TABLET BY MOUTH AT BEDTIME 90 tablet 2   No current facility-administered medications on file prior to visit.    BP 128/80 mmHg  Temp(Src) 98.5 F (36.9 C) (Oral)  Ht 5\' 10"  (1.778 m)  Wt 137 lb 12.8 oz (62.506 kg)  BMI 19.77 kg/m2       Objective:   Physical Exam  Constitutional: He is oriented to person, place, and time. He appears well-developed and well-nourished. No distress.  HENT:  Head: Normocephalic and atraumatic.  Right Ear: External ear normal.  Left Ear: External ear normal.  Nose: Nose normal.  Mouth/Throat: Oropharynx is clear and moist. No oropharyngeal exudate.  TM's visualized. No signs of infection or effusion.   Cardiovascular: Normal rate, regular rhythm, normal heart sounds and intact distal pulses.  Exam reveals no gallop and no friction rub.   No murmur  heard. Pulmonary/Chest: Effort normal and breath sounds normal. No respiratory distress. He has no wheezes. He has no rales. He exhibits no tenderness.  Musculoskeletal: Normal range of motion. He exhibits no edema or tenderness.  Lymphadenopathy:    He has no cervical adenopathy.  Neurological: He is alert and oriented to person, place, and time.  Skin: Skin is warm and dry. He is not diaphoretic.  Psychiatric: He has a normal mood and affect. His behavior is normal. Judgment and thought content normal.  Nursing note and vitals reviewed.      Assessment & Plan:  1. Dizziness - Likely from dehydration  but cannot rule out hypotension or low blood sugar. Less likely result of blood clot.  - Basic metabolic panel - CBC with Differential/Platelet - Start monitoring BP at home when he becomes dizzy - Consider changing BP medications.

## 2014-11-14 NOTE — Progress Notes (Signed)
Pre visit review using our clinic review tool, if applicable. No additional management support is needed unless otherwise documented below in the visit note. 

## 2014-11-15 ENCOUNTER — Other Ambulatory Visit: Payer: Self-pay | Admitting: Internal Medicine

## 2014-11-20 ENCOUNTER — Encounter: Payer: Self-pay | Admitting: Internal Medicine

## 2014-11-20 ENCOUNTER — Ambulatory Visit (INDEPENDENT_AMBULATORY_CARE_PROVIDER_SITE_OTHER): Payer: Medicare Other | Admitting: Internal Medicine

## 2014-11-20 VITALS — BP 135/70 | HR 70 | Ht 70.0 in | Wt 137.0 lb

## 2014-11-20 DIAGNOSIS — J387 Other diseases of larynx: Secondary | ICD-10-CM

## 2014-11-20 DIAGNOSIS — R1310 Dysphagia, unspecified: Secondary | ICD-10-CM | POA: Diagnosis not present

## 2014-11-20 DIAGNOSIS — K219 Gastro-esophageal reflux disease without esophagitis: Secondary | ICD-10-CM

## 2014-11-20 NOTE — Patient Instructions (Addendum)
  Continue to follow the things that speech pathology instructed you to do.   Follow up with Dr. Harl Bowie as needed.     I appreciate the opportunity to care for you. Dr Drema Pry, Dr Carlisle Cater NP

## 2014-11-20 NOTE — Progress Notes (Signed)
Jeremy Kelley 11-04-1926 [id number]  Note: This dictation was prepared with Dragon digital system. Any transcriptional errors that result from this procedure are unintentional.   History of Present Illness: This is a 81 year old white male presents for follow-up of  LPR, dysphagia to pills and liquids and ?? aspiration. Last appointment in June 2016. Barium esophagram showed aspiration of the moderate amount of barium with minimal cough reflex. Decreased esophageal motility. Mildly dilated esophagus but no stricture. Barium tablet passed without delay. He was referred to speech pathology for modified barium swallow which  revealed tracheal aspiration, often silent mitigated by a chin tuck.Jeremy Kelley He waso instructed in the techniques to reduce  aspiration. The final recommendation of the speech pathologist was a small bites, regular diet with thin liquids, use straw. Also suggestion for neurology consult if there is any problem with further aspiration. Patient is much improved in terms of resolution of the cough. Overall swallowing better. His pills go down better. He remains active biking and kayaking several times a week. His weight, which initially decreased by 10 pounds, has remained stable. He is satisfied with the progress.    Past Medical History  Diagnosis Date  . History of prostate cancer 2001  . Hypertension   . Colon polyps   . Cataract   . Phimosis   . Carotid artery stenosis 11/2008    bilateral 40-59% stenosis  . Hepatitis     doesn't know which type 1969  . Hyperlipidemia   . Numbness     fingertips  . Urothelial carcinoma of left distal ureter   . Diverticulosis     Past Surgical History  Procedure Laterality Date  . Inguinal hernia repair    . Varicose vein surgery    . Other surgical history      s/p laparoscopic prostate surgery  . Skin cancer excision      a/p skin cancer right foot-non melanoma  . Bladder suspension  08/18/10    [name]  . Eye surgery   09/24/10    left eye. "Retina surgery"  . Transurethral resection of bladder tumor  10/01/2011    Procedure: TRANSURETHRAL RESECTION OF BLADDER TUMOR (TURBT);  Surgeon: [name], MD;  Location: [facility];  Service: Urology;  Laterality: N/A;  . Esophageal biopsy  10/01/2011    Procedure: BIOPSY;  Surgeon: [name], MD;  Location: [facility];  Service: Urology;;  biopsy of bladder tumor  . Ureteroscopy  01/27/2012    Procedure: URETEROSCOPY;  Surgeon: [name], MD;  Location: [facility];  Service: Urology;  Laterality: Left;  CYSTO, Left RETROGRADE PYELOGRPHY, LEFT URETEROSCOPY   . Cystoscopy w/ retrogrades  01/27/2012    Procedure: CYSTOSCOPY WITH RETROGRADE PYELOGRAM;  Surgeon: [name], MD;  Location: [facility];  Service: Urology;  Laterality: Left;  . Hernia repair      x2  . Urinary sphincter implant    . Cystoscopy with retrograde pyelogram, ureteroscopy and stent placement Left 11/24/2012    Procedure: CYSTOSCOPY WITH left RETROGRADE PYELOGRAM, bladder washings, ureteroscopy;  Surgeon: [name], MD;  Location: [facility];  Service: Urology;  Laterality: Left;  . Transurethral resection of bladder tumor N/A 11/19/2013    Procedure: TRANSURETHRAL RESECTION OF BLADDER TUMOR (TURBT);  Surgeon: [name], MD;  Location: [facility];  Service: Urology;  Laterality: N/A;  . Cystoscopy with retrograde pyelogram, ureteroscopy and stent placement Bilateral 11/19/2013    Procedure: [place],;  Surgeon: [name], MD;  Location: [facility];  Service: Urology;  Laterality: Bilateral;    No Known Allergies  Family history and social history have been reviewed.  Review of Systems:   The remainder of the 10 point ROS is negative except as outlined in the H&P  Physical Exam: General Appearance thin, in no distress Neurological Alert and oriented x 3 Psychological Normal mood and affect  Assessment and Plan:   81 year old white male with presbyesophagus. Moderate  aspiration on barium esophagram. Confirmed on modified barium swallow with marked improvement after genotype and all instructions to minimize aspiration. He is clinically much improved. Final recommendation by speech pathology is to remain on regular diet to limit the bite size. Exercise chin pack and use straw. If coughing recurs refer to neurology. We will see him on when necessary basis. Transfer care to [facility] after my retirement     Jeremy Kelley 11/20/2014

## 2014-11-27 ENCOUNTER — Telehealth: Payer: Self-pay | Admitting: Internal Medicine

## 2014-11-27 NOTE — Telephone Encounter (Signed)
Lmom letting pt know I made an appt for him  tomorrow 11-28-14 AT 11:15 AM

## 2014-11-27 NOTE — Telephone Encounter (Signed)
Yes

## 2014-11-27 NOTE — Telephone Encounter (Signed)
Pt would like to see cory tomorrow for back pain. Can I use sda slot?

## 2014-11-28 ENCOUNTER — Ambulatory Visit (INDEPENDENT_AMBULATORY_CARE_PROVIDER_SITE_OTHER): Payer: Medicare Other | Admitting: Adult Health

## 2014-11-28 ENCOUNTER — Encounter: Payer: Self-pay | Admitting: Adult Health

## 2014-11-28 VITALS — BP 136/82 | Temp 98.3°F | Ht 70.0 in | Wt 135.5 lb

## 2014-11-28 DIAGNOSIS — M545 Low back pain: Secondary | ICD-10-CM

## 2014-11-28 LAB — POCT URINALYSIS DIPSTICK
Bilirubin, UA: NEGATIVE
Glucose, UA: NEGATIVE
Ketones, UA: NEGATIVE
Leukocytes, UA: NEGATIVE
NITRITE UA: NEGATIVE
Protein, UA: NEGATIVE
RBC UA: NEGATIVE
Spec Grav, UA: >=1.03
UROBILINOGEN UA: 0.2
pH, UA: 5.5

## 2014-11-28 NOTE — Progress Notes (Signed)
Pre visit review using our clinic review tool, if applicable. No additional management support is needed unless otherwise documented below in the visit note. 

## 2014-11-28 NOTE — Patient Instructions (Signed)
It was great seeing you again.   In regard to the back pain, it is likely related to a muscle. Since the pain is getting better, please use Ibuprofen 400mg  as needed. If you do not notice any improvement in the next 2-3 days, please let me know.

## 2014-11-28 NOTE — Telephone Encounter (Signed)
Pt is aware.  

## 2014-11-28 NOTE — Progress Notes (Signed)
Subjective:    Patient ID: Jeremy Kelley, male    DOB: 04/16/1926, 81 y.o.   MRN: [id number]  HPI  81 year old male who presents to the office today for acute back pain that started on Tuesday. It is isolated to the the left side. Pain is more intense if sitting in a fixed position for a long time, pain during these episodes is described as "intense". He has discomfort while driving. He noticed that the pain goes away while he is walking, so he went to the mall yesterday and walked for one hour. He did not have any discomfort after that. He endorses waking up this morning with " very very little pain". On the car ride to the office today he did not have any pain. Did not take any medications for pain.   Denies any other symptoms.   Review of Systems  Musculoskeletal: Positive for myalgias and back pain. Negative for joint swelling, arthralgias, neck pain and neck stiffness.  Neurological: Negative.   All other systems reviewed and are negative.  Past Medical History  Diagnosis Date  . History of prostate cancer 2001  . Hypertension   . Colon polyps   . Cataract   . Phimosis   . Carotid artery stenosis 11/2008    bilateral 40-59% stenosis  . Hepatitis     doesn't know which type 1969  . Hyperlipidemia   . Numbness     fingertips  . Urothelial carcinoma of left distal ureter   . Diverticulosis     Social History   Social History  . Marital Status: Single    Spouse Name: N/A  . Number of Children: 0  . Years of Education: N/A   Occupational History  . Retired    Social History Main Topics  . Smoking status: Passive Smoke Exposure - Never Smoker    Types: Cigars  . Smokeless tobacco: Never Used     Comment: occasionally smokes a cigar  . Alcohol Use: 0.0 oz/week    6-7 Glasses of wine per week     Comment: 3-4 glasses of wine per week-no history of excessinve alcohol use  . Drug Use: No  . Sexual Activity: Not on file   Other Topics Concern  . Not on file    Social History Narrative   Patient is single, has never been married.  Does not have any children.  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   He drinks approximately 3-4 glasses of wine per week.  No history of excessive alcohol use.  Occasionally smokes a cigar.   Spends winters in [place]         Past Surgical History  Procedure Laterality Date  . Inguinal hernia repair    . Varicose vein surgery    . Other surgical history      s/p laparoscopic prostate surgery  . Skin cancer excision      a/p skin cancer right foot-non melanoma  . Bladder suspension  08/18/10    [name]  . Eye surgery  09/24/10    left eye. "Retina surgery"  . Transurethral resection of bladder tumor  10/01/2011    Procedure: TRANSURETHRAL RESECTION OF BLADDER TUMOR (TURBT);  Surgeon: [name], MD;  Location: [facility];  Service: Urology;  Laterality: N/A;  . Esophageal biopsy  10/01/2011    Procedure: BIOPSY;  Surgeon: [name], MD;  Location: [facility];  Service: Urology;;  biopsy of bladder tumor  . Ureteroscopy  01/27/2012    Procedure: URETEROSCOPY;  Surgeon: [name], MD;  Location: [facility];  Service: Urology;  Laterality: Left;  CYSTO, Left RETROGRADE PYELOGRPHY, LEFT URETEROSCOPY   . Cystoscopy w/ retrogrades  01/27/2012    Procedure: CYSTOSCOPY WITH RETROGRADE PYELOGRAM;  Surgeon: [name], MD;  Location: [facility];  Service: Urology;  Laterality: Left;  . Hernia repair      x2  . Urinary sphincter implant    . Cystoscopy with retrograde pyelogram, ureteroscopy and stent placement Left 11/24/2012    Procedure: CYSTOSCOPY WITH left RETROGRADE PYELOGRAM, bladder washings, ureteroscopy;  Surgeon: [name], MD;  [facility]: [facility];  Service: Urology;  Laterality: Left;  . Transurethral resection of bladder tumor N/A 11/19/2013    Procedure: TRANSURETHRAL RESECTION OF BLADDER TUMOR (TURBT);  Surgeon: [name], MD;  Location: [facility];  Service: Urology;  Laterality: N/A;  . Cystoscopy  with retrograde pyelogram, ureteroscopy and stent placement Bilateral 11/19/2013    Procedure: [place],;  Surgeon: [name], MD;  Location: [facility];  Service: Urology;  Laterality: Bilateral;    Family History  Problem Relation Age of Onset  . Colon cancer Neg Hx   . Colon polyps Neg Hx     No Known Allergies  Current Outpatient Prescriptions on File Prior to Visit  Medication Sig Dispense Refill  . B Complex-C (B-COMPLEX WITH VITAMIN C) tablet Take 1 tablet by mouth daily.    [name] losartan (COZAAR) 25 MG tablet TAKE 1 TABLET (25 MG TOTAL) BY MOUTH DAILY. 90 tablet 1  . methylcellulose (ARTIFICIAL TEARS) 1 % ophthalmic solution Place 1 drop into both eyes 2 (two) times daily.     . metoprolol succinate (TOPROL-XL) 25 MG 24 hr tablet TAKE 1 TABLET BY MOUTH AT BEDTIME 90 tablet 0  . metoprolol succinate (TOPROL-XL) 25 MG 24 hr tablet TAKE 1 TABLET (25 MG TOTAL) BY MOUTH DAILY. 90 tablet 1  . Multiple Vitamin (MULTIVITAMIN) tablet Take 1 tablet by mouth daily with breakfast.     . Multiple Vitamins-Minerals (PRESERVISION/LUTEIN PO) Take 1 tablet by mouth daily.    . pravastatin (PRAVACHOL) 20 MG tablet TAKE 1 TABLET BY MOUTH AT BEDTIME 90 tablet 2   No current facility-administered medications on file prior to visit.    BP 160/70 mmHg  Temp(Src) 98.3 F (36.8 C) (Oral)  Ht 5\' 10"  (1.778 m)  Wt 135 lb 8 oz (61.462 kg)  BMI 19.44 kg/m2       Objective:   Physical Exam  Constitutional: He is oriented to person, place, and time. He appears well-developed and well-nourished. No distress.  Musculoskeletal: Normal range of motion. He exhibits no edema or tenderness.  Neurological: He is alert and oriented to person, place, and time.  Skin: Skin is warm and dry. No rash noted. He is not diaphoretic. No erythema. No pallor.  No bruising, redness or warmth noted.   Psychiatric: He has a normal mood and affect. His behavior is normal. Judgment and  thought content normal.  Nursing note and vitals reviewed.      Assessment & Plan:  1. Low back pain without sciatica, unspecified back pain laterality - Likely MSK in nature - POCT urinalysis dipstick- Negative - Continue to walk - Refrain from lifting heavy objects - Take Ibuprofen as needed. - Follow up if pain returns.

## 2014-11-29 DIAGNOSIS — C678 Malignant neoplasm of overlapping sites of bladder: Secondary | ICD-10-CM | POA: Diagnosis not present

## 2014-11-29 DIAGNOSIS — N3289 Other specified disorders of bladder: Secondary | ICD-10-CM | POA: Diagnosis not present

## 2014-11-29 DIAGNOSIS — C669 Malignant neoplasm of unspecified ureter: Secondary | ICD-10-CM | POA: Diagnosis not present

## 2014-12-04 DIAGNOSIS — C678 Malignant neoplasm of overlapping sites of bladder: Secondary | ICD-10-CM | POA: Diagnosis not present

## 2014-12-04 DIAGNOSIS — C669 Malignant neoplasm of unspecified ureter: Secondary | ICD-10-CM | POA: Diagnosis not present

## 2014-12-06 ENCOUNTER — Encounter: Payer: Self-pay | Admitting: Adult Health

## 2014-12-06 ENCOUNTER — Ambulatory Visit (INDEPENDENT_AMBULATORY_CARE_PROVIDER_SITE_OTHER): Payer: Medicare Other | Admitting: Adult Health

## 2014-12-06 VITALS — BP 130/64 | HR 56 | Temp 98.0°F | Resp 18 | Ht 70.0 in | Wt 136.0 lb

## 2014-12-06 DIAGNOSIS — Z23 Encounter for immunization: Secondary | ICD-10-CM

## 2014-12-06 DIAGNOSIS — Z760 Encounter for issue of repeat prescription: Secondary | ICD-10-CM | POA: Diagnosis not present

## 2014-12-06 MED ORDER — PRAVASTATIN SODIUM 20 MG PO TABS: 20.0000 mg | ORAL_TABLET | Freq: Every day | ORAL | Status: DC

## 2014-12-06 MED ORDER — LOSARTAN POTASSIUM 25 MG PO TABS: ORAL_TABLET | ORAL | Status: DC

## 2014-12-06 MED ORDER — METOPROLOL SUCCINATE ER 25 MG PO TB24: 25.0000 mg | ORAL_TABLET | Freq: Every day | ORAL | Status: DC

## 2014-12-06 NOTE — Progress Notes (Signed)
Subjective:    Patient ID: Jeremy Kelley, male    DOB: [birth year], 81 y.o.   MRN: [id number]  HPI  81 year old male who presents to the office today for multiple items.  1) He is leaving for [place] and will return in April. He needs his medications sent to the pharmacy for six months  2) Up date his vaccinations.   3) He would like [place] to get his recent MRI results from [facility] so that we have a copy of them.   He has no other complaints at this time.     Review of Systems  Constitutional: Negative.   HENT: Negative.   Eyes: Negative.   Respiratory: Negative.   Cardiovascular: Negative.   Gastrointestinal: Negative.   Endocrine: Negative.   Genitourinary: Negative.   Musculoskeletal: Negative.   Skin: Negative.   Allergic/Immunologic: Negative.   Neurological: Negative.   Hematological: Negative.   Psychiatric/Behavioral: Negative.   All other systems reviewed and are negative.  Past Medical History  Diagnosis Date  . History of prostate cancer 2001  . Hypertension   . Colon polyps   . Cataract   . Phimosis   . Carotid artery stenosis 11/2008    bilateral 40-59% stenosis  . Hepatitis     doesn't know which type 1969  . Hyperlipidemia   . Numbness     fingertips  . Urothelial carcinoma of left distal ureter   . Diverticulosis     Social History   Social History  . Marital Status: Single    Spouse Name: N/A  . Number of Children: 0  . Years of Education: N/A   Occupational History  . Retired    Social History Main Topics  . Smoking status: Passive Smoke Exposure - Never Smoker    Types: Cigars  . Smokeless tobacco: Never Used     Comment: occasionally smokes a cigar  . Alcohol Use: 0.0 oz/week    6-7 Glasses of wine per week     Comment: 3-4 glasses of wine per week-no history of excessinve alcohol use  . Drug Use: No  . Sexual Activity: Not on file   Other Topics Concern  . Not on file   Social History Narrative   Patient  is single, has never been married.  Does not have any children.  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   He drinks approximately 3-4 glasses of wine per week.  No history of excessive alcohol use.  Occasionally smokes a cigar.   Spends winters in [place]         Past Surgical History  Procedure Laterality Date  . Inguinal hernia repair    . Varicose vein surgery    . Other surgical history      s/p laparoscopic prostate surgery  . Skin cancer excision      a/p skin cancer right foot-non melanoma  . Bladder suspension  08/18/10    [name]  . Eye surgery  09/24/10    left eye. "Retina surgery"  . Transurethral resection of bladder tumor  10/01/2011    Procedure: TRANSURETHRAL RESECTION OF BLADDER TUMOR (TURBT);  Surgeon: [name], MD;  Location: [facility];  Service: Urology;  Laterality: N/A;  . Esophageal biopsy  10/01/2011    Procedure: BIOPSY;  Surgeon: [name], MD;  Location: [facility];  Service: Urology;;  biopsy of bladder tumor  . Ureteroscopy  01/27/2012    Procedure: URETEROSCOPY;  Surgeon: [name],  MD;  Location: [facility];  Service: Urology;  Laterality: Left;  CYSTO, Left RETROGRADE PYELOGRPHY, LEFT URETEROSCOPY   . Cystoscopy w/ retrogrades  01/27/2012    Procedure: CYSTOSCOPY WITH RETROGRADE PYELOGRAM;  Surgeon: [name], MD;  Location: [facility];  Service: Urology;  Laterality: Left;  . Hernia repair      x2  . Urinary sphincter implant    . Cystoscopy with retrograde pyelogram, ureteroscopy and stent placement Left 11/24/2012    Procedure: CYSTOSCOPY WITH left RETROGRADE PYELOGRAM, bladder washings, ureteroscopy;  Surgeon: [name], MD;  [facility]: [facility];  Service: Urology;  Laterality: Left;  . Transurethral resection of bladder tumor N/A 11/19/2013    Procedure: TRANSURETHRAL RESECTION OF BLADDER TUMOR (TURBT);  Surgeon: [name], MD;  [facility];  Service: Urology;  Laterality: N/A;  . Cystoscopy with retrograde pyelogram, ureteroscopy  and stent placement Bilateral 11/19/2013    Procedure: [place],;  Surgeon: [name], MD;  [facility];  Service: Urology;  Laterality: Bilateral;    Family History  Problem Relation Age of Onset  . Colon cancer Neg Hx   . Colon polyps Neg Hx     No Known Allergies  Current Outpatient Prescriptions on File Prior to Visit  Medication Sig Dispense Refill  . B Complex-C (B-COMPLEX WITH VITAMIN C) tablet Take 1 tablet by mouth daily.    . methylcellulose (ARTIFICIAL TEARS) 1 % ophthalmic solution Place 1 drop into both eyes 2 (two) times daily.     . Multiple Vitamin (MULTIVITAMIN) tablet Take 1 tablet by mouth daily with breakfast.     . Multiple Vitamins-Minerals (PRESERVISION/LUTEIN PO) Take 1 tablet by mouth daily.     No current facility-administered medications on file prior to visit.    BP 130/64 mmHg  Pulse 56  Temp(Src) 98 F (36.7 C) (Oral)  Resp 18  Ht 5\' 10"  (1.778 m)  Wt 136 lb (61.689 kg)  BMI 19.51 kg/m2  SpO2 97%       Objective:   Physical Exam  Constitutional: He is oriented to person, place, and time. He appears well-developed and well-nourished. No distress.  Cardiovascular: Normal rate, regular rhythm, normal heart sounds and intact distal pulses.  Exam reveals no gallop and no friction rub.   No murmur heard. Pulmonary/Chest: Effort normal and breath sounds normal. No respiratory distress. He has no wheezes. He has no rales. He exhibits no tenderness.  Lymphadenopathy:    He has no cervical adenopathy.  Neurological: He is alert and oriented to person, place, and time.  Skin: Skin is warm and dry. No rash noted. He is not diaphoretic. No erythema. No pallor.  Psychiatric: He has a normal mood and affect. His behavior is normal. Judgment and thought content normal.  Nursing note and vitals reviewed.      Assessment & Plan:  1. Medication refill - losartan (COZAAR) 25 MG tablet; TAKE 1 TABLET (25 MG  TOTAL) BY MOUTH DAILY.  Dispense: 180 tablet; Refill: 0 - metoprolol succinate (TOPROL-XL) 25 MG 24 hr tablet; Take 1 tablet (25 mg total) by mouth at bedtime.  Dispense: 180 tablet; Refill: 0 - pravastatin (PRAVACHOL) 20 MG tablet; Take 1 tablet (20 mg total) by mouth at bedtime.  Dispense: 180 tablet; Refill: 2 - Follow up with insurance to make sure they authorized this.   - Stop by front counter and fill out paperwork for release of information for MRI   2. Need for prophylactic vaccination against Streptococcus pneumoniae (  pneumococcus) - Pneumococcal conjugate vaccine 13-valent IM  3. Need for prophylactic vaccination and inoculation against influenza - Flu Vaccine QUAD 36+ mos IM

## 2014-12-06 NOTE — Patient Instructions (Addendum)
It was great seeing you again!  You are now up to date on your vaccinations.   Your medications have been sent to the pharmacy for six months. Make sure that your insurance authorizes this.   Stop by the front desk and sign a release for the MRI from [facility].   If you need anything, please let me know.   Enjoy your trip!!!

## 2014-12-06 NOTE — Progress Notes (Signed)
Pre visit review using our clinic review tool, if applicable. No additional management support is needed unless otherwise documented below in the visit note. 

## 2014-12-08 ENCOUNTER — Encounter (HOSPITAL_COMMUNITY): Payer: Self-pay | Admitting: Emergency Medicine

## 2014-12-08 ENCOUNTER — Emergency Department (HOSPITAL_COMMUNITY)
Admission: EM | Admit: 2014-12-08 | Discharge: 2014-12-08 | Disposition: A | Discharge status: Home / Self Care | Payer: Medicare Other | Attending: Emergency Medicine | Admitting: Emergency Medicine

## 2014-12-08 DIAGNOSIS — Z79899 Other long term (current) drug therapy: Secondary | ICD-10-CM | POA: Diagnosis not present

## 2014-12-08 DIAGNOSIS — E785 Hyperlipidemia, unspecified: Secondary | ICD-10-CM | POA: Insufficient documentation

## 2014-12-08 DIAGNOSIS — Z7982 Long term (current) use of aspirin: Secondary | ICD-10-CM | POA: Diagnosis not present

## 2014-12-08 DIAGNOSIS — S51801A Unspecified open wound of right forearm, initial encounter: Secondary | ICD-10-CM | POA: Insufficient documentation

## 2014-12-08 DIAGNOSIS — S56022A Laceration of flexor muscle, fascia and tendon of left thumb at forearm level, initial encounter: Secondary | ICD-10-CM | POA: Diagnosis not present

## 2014-12-08 DIAGNOSIS — Y998 Other external cause status: Secondary | ICD-10-CM | POA: Diagnosis not present

## 2014-12-08 DIAGNOSIS — S51811A Laceration without foreign body of right forearm, initial encounter: Secondary | ICD-10-CM | POA: Diagnosis not present

## 2014-12-08 DIAGNOSIS — Z86010 Personal history of colonic polyps: Secondary | ICD-10-CM | POA: Insufficient documentation

## 2014-12-08 DIAGNOSIS — I10 Essential (primary) hypertension: Secondary | ICD-10-CM | POA: Diagnosis not present

## 2014-12-08 DIAGNOSIS — S51812A Laceration without foreign body of left forearm, initial encounter: Secondary | ICD-10-CM | POA: Diagnosis not present

## 2014-12-08 DIAGNOSIS — Z8669 Personal history of other diseases of the nervous system and sense organs: Secondary | ICD-10-CM | POA: Diagnosis not present

## 2014-12-08 DIAGNOSIS — Z8554 Personal history of malignant neoplasm of ureter: Secondary | ICD-10-CM | POA: Insufficient documentation

## 2014-12-08 DIAGNOSIS — Y9289 Other specified places as the place of occurrence of the external cause: Secondary | ICD-10-CM | POA: Insufficient documentation

## 2014-12-08 DIAGNOSIS — Y9316 Activity, rowing, canoeing, kayaking, rafting and tubing: Secondary | ICD-10-CM | POA: Insufficient documentation

## 2014-12-08 DIAGNOSIS — S61012A Laceration without foreign body of left thumb without damage to nail, initial encounter: Secondary | ICD-10-CM | POA: Diagnosis not present

## 2014-12-08 DIAGNOSIS — Z87438 Personal history of other diseases of male genital organs: Secondary | ICD-10-CM | POA: Insufficient documentation

## 2014-12-08 DIAGNOSIS — T148XXA Other injury of unspecified body region, initial encounter: Secondary | ICD-10-CM

## 2014-12-08 DIAGNOSIS — W010XXA Fall on same level from slipping, tripping and stumbling without subsequent striking against object, initial encounter: Secondary | ICD-10-CM | POA: Diagnosis not present

## 2014-12-08 DIAGNOSIS — S59911A Unspecified injury of right forearm, initial encounter: Secondary | ICD-10-CM | POA: Diagnosis present

## 2014-12-08 DIAGNOSIS — Z9889 Other specified postprocedural states: Secondary | ICD-10-CM | POA: Insufficient documentation

## 2014-12-08 DIAGNOSIS — Z8546 Personal history of malignant neoplasm of prostate: Secondary | ICD-10-CM | POA: Diagnosis not present

## 2014-12-08 DIAGNOSIS — Z8719 Personal history of other diseases of the digestive system: Secondary | ICD-10-CM | POA: Insufficient documentation

## 2014-12-08 NOTE — Discharge Instructions (Signed)
Skin Tear Care  A skin tear is a wound in which the top layer of skin has peeled off. This is a common problem with aging because the skin becomes thinner and more fragile as a person gets older. In addition, some medicines, such as oral corticosteroids, can lead to skin thinning if taken for long periods of time.   A skin tear is often repaired with tape or skin adhesive strips. This keeps the skin that has been peeled off in contact with the healthier skin beneath. Depending on the location of the wound, a bandage (dressing) may be applied over the tape or skin adhesive strips. Sometimes, during the healing process, the skin turns black and dies. Even when this happens, the torn skin acts as a good dressing until the skin underneath gets healthier and repairs itself.  HOME CARE INSTRUCTIONS   · Change dressings once per day or as directed by your caregiver.  ¨ Gently clean the skin tear and the area around the tear using saline solution or mild soap and water.  ¨ Do not rub the injured skin dry. Let the area air dry.  ¨ Apply petroleum jelly or an antibiotic cream or ointment to keep the tear moist. This will help the wound heal. Do not allow a scab to form.  ¨ If the dressing sticks before the next dressing change, moisten it with warm soapy water and gently remove it.  · Protect the injured skin until it has healed.  · Only take over-the-counter or prescription medicines as directed by your caregiver.  · Take showers or baths using warm soapy water. Apply a new dressing after the shower or bath.  · Keep all follow-up appointments as directed by your caregiver.    SEEK IMMEDIATE MEDICAL CARE IF:   · You have redness, swelling, or increasing pain in the skin tear.  · You have pus coming from the skin tear.  · You have chills.  · You have a red streak that goes away from the skin tear.  · You have a bad smell coming from the tear or dressing.  · You have a fever or persistent symptoms for more than 2-3 days.  · You  have a fever and your symptoms suddenly get worse.  MAKE SURE YOU:  · Understand these instructions.  · Will watch this condition.  · Will get help right away if your child is not doing well or gets worse.  Document Released: 12/22/2000 Document Revised: 12/22/2011 Document Reviewed: 10/11/2011  Exit[employer]® Patient Information ©2015 [employer]. This information is not intended to replace advice given to you by your health care provider. Make sure you discuss any questions you have with your health care provider.

## 2014-12-08 NOTE — ED Notes (Signed)
Wound care provided, post instructions given, patient verbalized understanding.
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
## 2014-12-09 ENCOUNTER — Encounter: Payer: Self-pay | Admitting: Adult Health

## 2014-12-09 ENCOUNTER — Ambulatory Visit (INDEPENDENT_AMBULATORY_CARE_PROVIDER_SITE_OTHER): Payer: Medicare Other | Admitting: Adult Health

## 2014-12-09 VITALS — BP 124/68 | Temp 98.4°F | Ht 70.0 in | Wt 134.5 lb

## 2014-12-09 DIAGNOSIS — T148 Other injury of unspecified body region: Secondary | ICD-10-CM | POA: Diagnosis not present

## 2014-12-09 DIAGNOSIS — T148XXA Other injury of unspecified body region, initial encounter: Secondary | ICD-10-CM

## 2014-12-09 MED ORDER — CEPHALEXIN 500 MG PO CAPS: 500.0000 mg | ORAL_CAPSULE | Freq: Two times a day (BID) | ORAL | Status: DC

## 2014-12-09 NOTE — Progress Notes (Signed)
Pre visit review using our clinic review tool, if applicable. No additional management support is needed unless otherwise documented below in the visit note. 
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
## 2014-12-10 DIAGNOSIS — S51802S Unspecified open wound of left forearm, sequela: Secondary | ICD-10-CM | POA: Diagnosis not present

## 2014-12-10 DIAGNOSIS — S51801A Unspecified open wound of right forearm, initial encounter: Secondary | ICD-10-CM | POA: Diagnosis not present

## 2014-12-10 DIAGNOSIS — S61402A Unspecified open wound of left hand, initial encounter: Secondary | ICD-10-CM | POA: Diagnosis not present

## 2014-12-11 ENCOUNTER — Ambulatory Visit (INDEPENDENT_AMBULATORY_CARE_PROVIDER_SITE_OTHER): Payer: Medicare Other | Admitting: Adult Health

## 2014-12-11 ENCOUNTER — Encounter: Payer: Self-pay | Admitting: Adult Health

## 2014-12-11 VITALS — BP 110/80 | Temp 98.3°F | Ht 70.0 in | Wt 134.0 lb

## 2014-12-11 DIAGNOSIS — Z5189 Encounter for other specified aftercare: Secondary | ICD-10-CM
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
## 2014-12-11 NOTE — Progress Notes (Signed)
Pre visit review using our clinic review tool, if applicable. No additional management support is needed unless otherwise documented below in the visit note. 

## 2015-07-14 DIAGNOSIS — C61 Malignant neoplasm of prostate: Secondary | ICD-10-CM | POA: Diagnosis not present

## 2015-07-15 ENCOUNTER — Ambulatory Visit (INDEPENDENT_AMBULATORY_CARE_PROVIDER_SITE_OTHER): Payer: Medicare Other | Admitting: Adult Health

## 2015-07-15 ENCOUNTER — Telehealth: Payer: Self-pay | Admitting: Cardiovascular Disease

## 2015-07-15 ENCOUNTER — Other Ambulatory Visit: Payer: Self-pay | Admitting: *Deleted

## 2015-07-15 ENCOUNTER — Encounter: Payer: Self-pay | Admitting: Adult Health

## 2015-07-15 VITALS — BP 102/64 | Temp 97.9°F | Wt 126.7 lb

## 2015-07-15 DIAGNOSIS — E559 Vitamin D deficiency, unspecified: Secondary | ICD-10-CM | POA: Diagnosis not present

## 2015-07-15 DIAGNOSIS — R5383 Other fatigue: Secondary | ICD-10-CM

## 2015-07-15 DIAGNOSIS — I4891 Unspecified atrial fibrillation: Secondary | ICD-10-CM

## 2015-07-15 DIAGNOSIS — R208 Other disturbances of skin sensation: Secondary | ICD-10-CM

## 2015-07-15 DIAGNOSIS — R0602 Shortness of breath: Secondary | ICD-10-CM

## 2015-07-15 DIAGNOSIS — Z760 Encounter for issue of repeat prescription: Secondary | ICD-10-CM

## 2015-07-15 DIAGNOSIS — R200 Anesthesia of skin: Secondary | ICD-10-CM

## 2015-07-15 LAB — POC URINALSYSI DIPSTICK (AUTOMATED)
Bilirubin, UA: NEGATIVE
Glucose, UA: NEGATIVE
KETONES UA: NEGATIVE
LEUKOCYTES UA: NEGATIVE
Nitrite, UA: NEGATIVE
PH UA: 5.5
PROTEIN UA: NEGATIVE
RBC UA: NEGATIVE
SPEC GRAV UA: 1.025
Urobilinogen, UA: 0.2

## 2015-07-15 LAB — BASIC METABOLIC PANEL
BUN: 34 mg/dL — ABNORMAL HIGH (ref 6–23)
CALCIUM: 10 mg/dL (ref 8.4–10.5)
CO2: 32 mEq/L (ref 19–32)
CREATININE: 0.97 mg/dL (ref 0.40–1.50)
Chloride: 105 mEq/L (ref 96–112)
GFR: 77.91 mL/min (ref >60.00)
Glucose, Bld: 88 mg/dL (ref 70–99)
Potassium: 5.3 mEq/L — ABNORMAL HIGH (ref 3.5–5.1)
Sodium: 140 mEq/L (ref 135–145)

## 2015-07-15 LAB — CBC WITH DIFFERENTIAL/PLATELET
Basophils Absolute: 0.1 10*3/uL (ref 0.0–0.1)
Basophils Relative: 0.6 % (ref 0.0–3.0)
Eosinophils Absolute: 0.3 10*3/uL (ref 0.0–0.7)
Eosinophils Relative: 3 % (ref 0.0–5.0)
HEMATOCRIT: 43.1 % (ref 39.0–52.0)
Hemoglobin: 14.3 g/dL (ref 13.0–17.0)
Lymphocytes Relative: 21.3 % (ref 12.0–46.0)
Lymphs Abs: 1.9 10*3/uL (ref 0.7–4.0)
MCHC: 33.3 g/dL (ref 30.0–36.0)
MCV: 93.4 fl (ref 78.0–100.0)
Monocytes Absolute: 0.5 10*3/uL (ref 0.1–1.0)
Monocytes Relative: 5.7 % (ref 3.0–12.0)
NEUTROS ABS: 6.3 10*3/uL (ref 1.4–7.7)
Neutrophils Relative %: 69.4 % (ref 43.0–77.0)
PLATELETS: 312 10*3/uL (ref 150.0–400.0)
RBC: 4.61 Mil/uL (ref 4.22–5.81)
RDW: 15 % (ref 11.5–15.5)
WBC: 9 10*3/uL (ref 4.0–10.5)

## 2015-07-15 LAB — VITAMIN B12: Vitamin B-12: 1005 pg/mL — ABNORMAL HIGH (ref 211–911)

## 2015-07-15 LAB — TSH: TSH: 0.97 u[IU]/mL (ref 0.35–4.50)

## 2015-07-15 MED ORDER — APIXABAN 2.5 MG PO TABS: 2.5000 mg | ORAL_TABLET | Freq: Two times a day (BID) | ORAL | Status: DC

## 2015-07-15 MED ORDER — METOPROLOL SUCCINATE ER 25 MG PO TB24: 25.0000 mg | ORAL_TABLET | Freq: Every day | ORAL | Status: DC

## 2015-07-15 NOTE — Progress Notes (Deleted)
Wt Readings from Last 3 Encounters:  07/15/15 126 lb 11.2 oz (57.471 kg)  12/11/14 134 lb (60.782 kg)  12/09/14 134 lb 8 oz (61.009 kg)

## 2015-07-15 NOTE — Progress Notes (Addendum)
Subjective:    Patient ID: Jeremy Kelley, male    DOB: May 18, 1926, 82 y.o.   MRN: [id number]  HPI  82 year old male presents to the office today after being in [place] from August 2016 to April 2017. He reports that during the last week of December he fell backwards and landed on his left rib cage cause fractures to multiple ribs. Since then in approximate January or February he has noticed a steady decline in his health. Most notably is back and neck pain s/p fall. Additionally he reports that he has severe fatigue and shortness of breath especially with exertion, especially with standing or walking extended periods of time.  He also reports having swelling in his ankles and calves, hacking cough with phlegm and most recently low blood pressures. He last took his blood pressure on April 2 at which time is 91/57.  He was seen in various health clinics in [place] and treated with various medications- from what I can tell has the medications are in [place], he was prescribed the following acetylcysteine, Aldactone, losartan, vitamin B12, an unnamed diuretic,Dipemina ( appears to be for skin or circulation). He is not taking any of the above  medications except for losartan  Additionally he's had a progressive loss of feeling in his toes and at night feels like he has cramps in his feet. He does have a history of venous insufficiency as well as varicose veins.  Review of Systems  Constitutional: Positive for activity change, appetite change and fatigue. Negative for fever, chills and diaphoresis.  HENT: Negative.   Respiratory: Positive for cough and shortness of breath. Negative for wheezing and stridor.   Cardiovascular: Negative.   Gastrointestinal: Negative.   Musculoskeletal: Positive for myalgias, back pain and arthralgias. Negative for joint swelling, gait problem, neck pain and neck stiffness.  Skin: Positive for color change.  Allergic/Immunologic: Negative.   Neurological:  Positive for weakness. Negative for dizziness, light-headedness, numbness and headaches.  Hematological: Bruises/bleeds easily.  Psychiatric/Behavioral: Negative.    Past Medical History  Diagnosis Date  . History of prostate cancer 2001  . Hypertension   . Colon polyps   . Cataract   . Phimosis   . Carotid artery stenosis 11/2008    bilateral 40-59% stenosis  . Hepatitis     doesn't know which type 1969  . Hyperlipidemia   . Numbness     fingertips  . Urothelial carcinoma of left distal ureter ([place])   . Diverticulosis     Social History   Social History  . Marital Status: Single    Spouse Name: N/A  . Number of Children: 0  . Years of Education: N/A   Occupational History  . Retired    Social History Main Topics  . Smoking status: Passive Smoke Exposure - Never Smoker    Types: Cigars  . Smokeless tobacco: Never Used     Comment: occasionally smokes a cigar  . Alcohol Use: 0.0 oz/week    6-7 Glasses of wine per week     Comment: 3-4 glasses of wine per week-no history of excessinve alcohol use  . Drug Use: No  . Sexual Activity: Not on file   Other Topics Concern  . Not on file   Social History Narrative   Patient is single, has never been married.  Does not have any children.  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   He drinks approximately 3-4 glasses of wine per week.  No history of  excessive alcohol use.  Occasionally smokes a cigar.   Spends winters in [place]         Past Surgical History  Procedure Laterality Date  . Inguinal hernia repair    . Varicose vein surgery    . Other surgical history      s/p laparoscopic prostate surgery  . Skin cancer excision      a/p skin cancer right foot-non melanoma  . Bladder suspension  08/18/10    [name]  . Eye surgery  09/24/10    left eye. "Retina surgery"  . Transurethral resection of bladder tumor  10/01/2011    Procedure: TRANSURETHRAL RESECTION OF BLADDER TUMOR (TURBT);  Surgeon:  [name], MD;  [facility];  Service: Urology;  Laterality: N/A;  . Biopsy  10/01/2011    Procedure: BIOPSY;  Surgeon: [name], MD;  [facility];  Service: Urology;;  biopsy of bladder tumor  . Ureteroscopy  01/27/2012    Procedure: URETEROSCOPY;  Surgeon: [name], MD;  [facility];  Service: Urology;  Laterality: Left;  CYSTO, Left RETROGRADE PYELOGRPHY, LEFT URETEROSCOPY   . Cystoscopy w/ retrogrades  01/27/2012    Procedure: CYSTOSCOPY WITH RETROGRADE PYELOGRAM;  Surgeon: [name], MD;  [facility];  Service: Urology;  Laterality: Left;  . Hernia repair      x2  . Urinary sphincter implant    . Cystoscopy with retrograde pyelogram, ureteroscopy and stent placement Left 11/24/2012    Procedure: CYSTOSCOPY WITH left RETROGRADE PYELOGRAM, bladder washings, ureteroscopy;  Surgeon: [name], MD;  [facility];  Service: Urology;  Laterality: Left;  . Transurethral resection of bladder tumor N/A 11/19/2013    Procedure: TRANSURETHRAL RESECTION OF BLADDER TUMOR (TURBT);  Surgeon: [name], MD;  [facility];  Service: Urology;  Laterality: N/A;  . Cystoscopy with retrograde pyelogram, ureteroscopy and stent placement Bilateral 11/19/2013    Procedure: [place],;  Surgeon: [name], MD;  [facility];  Service: Urology;  Laterality: Bilateral;    Family History  Problem Relation Age of Onset  . Colon cancer Neg Hx   . Colon polyps Neg Hx     No Known Allergies  Current Outpatient Prescriptions on File Prior to Visit  Medication Sig Dispense Refill  . aspirin 81 MG tablet Take 81 mg by mouth daily.    . B Complex-C (B-COMPLEX WITH VITAMIN C) tablet Take 1 tablet by mouth daily.    . methylcellulose (ARTIFICIAL TEARS) 1 % ophthalmic solution Place 1 drop into both eyes 2 (two) times daily.     . Multiple Vitamins-Minerals (PRESERVISION/LUTEIN PO) Take 1 tablet by mouth daily.    . pravastatin (PRAVACHOL)  20 MG tablet Take 1 tablet (20 mg total) by mouth at bedtime. 180 tablet 2   No current facility-administered medications on file prior to visit.    BP 102/64 mmHg  Temp(Src) 97.9 F (36.6 C) (Oral)  Wt 126 lb 11.2 oz (57.471 kg)       Objective:   Physical Exam  Constitutional: He is oriented to person, place, and time. He appears well-developed and well-nourished. No distress.  HENT:  Head: Normocephalic and atraumatic.  Right Ear: External ear normal.  Left Ear: External ear normal.  Nose: Nose normal.  Mouth/Throat: Oropharynx is clear and moist. No oropharyngeal exudate.  Eyes: Conjunctivae and EOM are normal. Pupils are equal, round, and reactive to light. Right eye exhibits no discharge. Left eye exhibits no discharge.  Neck: Normal range of motion. Neck supple. No thyromegaly present.  Cardiovascular: Normal rate, intact distal pulses and normal pulses.  An irregularly irregular rhythm present. Exam reveals no gallop and no friction rub.   Murmur heard. Pulmonary/Chest: Effort normal and breath sounds normal. No respiratory distress. He has no wheezes. He has no rales. He exhibits no tenderness.  Abdominal: Soft. Bowel sounds are normal. He exhibits no distension and no mass. There is no tenderness. There is no rebound and no guarding.  Musculoskeletal: Normal range of motion. He exhibits no edema or tenderness.  Varicose veins present in bilateral legs Color change in bilateral feet- does not appear any worse than from what I have seen him in the past. No edema in lower extremities noticed  Lymphadenopathy:    He has no cervical adenopathy.  Neurological: He is alert and oriented to person, place, and time. He has normal reflexes. He displays normal reflexes. No cranial nerve deficit. He exhibits normal muscle tone. Coordination normal.  Skin: Skin is warm and dry. No rash noted. He is not diaphoretic. No erythema. No pallor.  Psychiatric: He has a normal mood and affect.  His behavior is normal. Judgment and thought content normal.  Nursing note and vitals reviewed.     Assessment & Plan:  1. Atrial fibrillation, unspecified type ([place]) - New onset. Chads-Vasc score of 4 -Spoke to Dr. Claiborne Billings with cardiology. Dr. Claiborne Billings advised patient to be started on 2.5 mg Eliquis twice a day, have an echocardiogram done, follow-up with cardiology. All these orders were placed - metoprolol succinate (TOPROL-XL) 25 MG 24 hr tablet; Take 1 tablet (25 mg total) by mouth at bedtime.  Dispense: 90 tablet; Refill: 0 - apixaban (ELIQUIS) 2.5 MG TABS tablet; Take 1 tablet (2.5 mg total) by mouth 2 (two) times daily.  Dispense: 60 tablet; Refill: 11 - Ambulatory referral to Cardiology - Echocardiogram; Future -Educated patient on risk factors of being on Eliquis including falls and increased bleeding time  2. Shortness of breath on exertion - Likely due in part to atrial fibrillation - POCT Urinalysis Dipstick (Automated) - Basic metabolic panel - CBC with Differential/Platelet - EKG 12-Lead - TSH - Vitamin B12 - Ambulatory referral to Cardiology - Echocardiogram; Future  3. Other fatigue - Likely in part due to atrial fibrillation - TSH - Vitamin B12 - Ambulatory referral to Cardiology - Echocardiogram; Future  4. Numbness in feet - Ambulatory referral to Vascular Surgery - Vitamin B12  5. Medication refill - Discontinued Losartandue to hypotension - metoprolol succinate (TOPROL-XL) 25 MG 24 hr tablet; Take 1 tablet (25 mg total) by mouth at bedtime.  Dispense: 90 tablet; Refill: 0 - Continue to monitor blood pressure at home

## 2015-07-15 NOTE — Telephone Encounter (Signed)
Per Dr. Evette Georges conversation with [facility] patient needs to have echo scheduled for afib and NP appointment. Message will be sent to schedulers to schedule. Order for echo has been placed.

## 2015-07-15 NOTE — Patient Instructions (Signed)
Someone will call you to schedule your appointments with Cardiology and Vascular Surgery.   I will follow up with you regarding your labs.

## 2015-07-15 NOTE — Telephone Encounter (Signed)
Called and spoke to patient regarding his labs. He informed me that he has his echocardiogram tomorrow morning at 8:15

## 2015-07-16 ENCOUNTER — Ambulatory Visit (HOSPITAL_COMMUNITY): Payer: Medicare Other | Attending: Cardiovascular Disease

## 2015-07-16 ENCOUNTER — Encounter: Payer: Self-pay | Admitting: Cardiovascular Disease

## 2015-07-16 ENCOUNTER — Ambulatory Visit (INDEPENDENT_AMBULATORY_CARE_PROVIDER_SITE_OTHER): Payer: Medicare Other | Admitting: Cardiovascular Disease

## 2015-07-16 ENCOUNTER — Telehealth: Payer: Self-pay | Admitting: Nurse Practitioner

## 2015-07-16 VITALS — BP 140/70 | HR 72 | Ht 70.0 in | Wt 126.8 lb

## 2015-07-16 DIAGNOSIS — R5383 Other fatigue: Secondary | ICD-10-CM | POA: Diagnosis not present

## 2015-07-16 DIAGNOSIS — I351 Nonrheumatic aortic (valve) insufficiency: Secondary | ICD-10-CM | POA: Diagnosis not present

## 2015-07-16 DIAGNOSIS — I4891 Unspecified atrial fibrillation: Secondary | ICD-10-CM | POA: Insufficient documentation

## 2015-07-16 DIAGNOSIS — I272 Other secondary pulmonary hypertension: Secondary | ICD-10-CM | POA: Insufficient documentation

## 2015-07-16 DIAGNOSIS — I071 Rheumatic tricuspid insufficiency: Secondary | ICD-10-CM | POA: Diagnosis not present

## 2015-07-16 DIAGNOSIS — I517 Cardiomegaly: Secondary | ICD-10-CM | POA: Insufficient documentation

## 2015-07-16 DIAGNOSIS — I482 Chronic atrial fibrillation, unspecified: Secondary | ICD-10-CM

## 2015-07-16 DIAGNOSIS — I340 Nonrheumatic mitral (valve) insufficiency: Secondary | ICD-10-CM | POA: Insufficient documentation

## 2015-07-16 DIAGNOSIS — R0602 Shortness of breath: Secondary | ICD-10-CM | POA: Diagnosis not present

## 2015-07-16 DIAGNOSIS — I371 Nonrheumatic pulmonary valve insufficiency: Secondary | ICD-10-CM | POA: Diagnosis not present

## 2015-07-16 HISTORY — DX: Chronic atrial fibrillation, unspecified: I48.20

## 2015-07-16 LAB — ECHOCARDIOGRAM COMPLETE
Height: 70 in
Weight: 2028.8 oz

## 2015-07-16 MED ORDER — FUROSEMIDE 40 MG PO TABS: 40.0000 mg | ORAL_TABLET | Freq: Every day | ORAL | Status: DC

## 2015-07-16 MED ORDER — POTASSIUM CHLORIDE ER 10 MEQ PO TBCR: 10.0000 meq | EXTENDED_RELEASE_TABLET | Freq: Every day | ORAL | Status: DC

## 2015-07-16 NOTE — Patient Instructions (Signed)
Medication Instructions:  Your physician recommends that you continue on your current medications as directed. Please refer to the Current Medication list given to you today.   Labwork: None Ordered   Testing/Procedures: Your physician has requested that you have an echocardiogram. Echocardiography is a painless test that uses sound waves to create images of your heart. It provides your doctor with information about the size and shape of your heart and how well your heart's chambers and valves are working. This procedure takes approximately one hour. There are no restrictions for this procedure.   Follow-Up: Your physician recommends that you schedule a follow-up appointment in: 4-6 weeks with Dr. Acie Fredrickson   If you need a refill on your cardiac medications before your next appointment, please call your pharmacy.   Thank you for choosing [facility]! Christen Bame, RN [phone]

## 2015-07-16 NOTE — Telephone Encounter (Signed)
Spoke with patient and advised him that Dr. Acie Fredrickson has reviewed his echo and would like him to start furosemide 40 mg daily and Kdur 10 meq daily.  He wants to see the patient for follow-up in 1 week.  I advised him that I have scheduled him for an appointment on 4/13 and have sent prescriptions to his preferred pharmacy.  I advised that there will be no procedures scheduled at this time as Dr. Acie Fredrickson had mentioned a possible TEE while the patient was in the office today.  He verbalized understanding and agreement and thanked me for the call.

## 2015-07-16 NOTE — Progress Notes (Addendum)
Cardiology Office Note   Date:  07/16/2015   ID:  Jeremy Kelley, DOB 07-Dec-1926, MRN [id number]  PCP:  Drema Pry, DO  Cardiologist:   Acie Fredrickson [name], MD   Chief Complaint  Patient presents with  . Atrial Fibrillation    le edema, sob with activity   Problem List 1. Atrial Fib  2. Hyperlipidemia 3. Raunauld 4. Carotid artery disease - 40-50% bilaterally    History of Present Illness: Jeremy Kelley is a 82 y.o. male who presents for further evaluation of atrial fib. He has noticed some fatigue and DOE for the past several months . Has DOE with very mild exertion  - ie walking [place] at the airport.   Denies any chest pain Retired from [employer].    Spends lots of time in [place]. [place]. Spends winters in [place] , [place] in [place] .      Past Medical History  Diagnosis Date  . History of prostate cancer 2001  . Hypertension   . Colon polyps   . Cataract   . Phimosis   . Carotid artery stenosis 11/2008    bilateral 40-59% stenosis  . Hepatitis     doesn't know which type 1969  . Hyperlipidemia   . Numbness     fingertips  . Urothelial carcinoma of left distal ureter ([place])   . Diverticulosis     Past Surgical History  Procedure Laterality Date  . Inguinal hernia repair    . Varicose vein surgery    . Other surgical history      s/p laparoscopic prostate surgery  . Skin cancer excision      a/p skin cancer right foot-non melanoma  . Bladder suspension  08/18/10    [name]  . Eye surgery  09/24/10    left eye. "Retina surgery"  . Transurethral resection of bladder tumor  10/01/2011    Procedure: TRANSURETHRAL RESECTION OF BLADDER TUMOR (TURBT);  Surgeon: [name], MD;  Location: [facility];  Service: Urology;  Laterality: N/A;  . Biopsy  10/01/2011    Procedure: BIOPSY;  Surgeon: [name], MD;  [facility]: [facility];  Service: Urology;;  biopsy of bladder tumor  . Ureteroscopy  01/27/2012    Procedure: URETEROSCOPY;  Surgeon: [name], MD;   [facility];  Service: Urology;  Laterality: Left;  CYSTO, Left RETROGRADE PYELOGRPHY, LEFT URETEROSCOPY   . Cystoscopy w/ retrogrades  01/27/2012    Procedure: CYSTOSCOPY WITH RETROGRADE PYELOGRAM;  Surgeon: [name], MD;  [facility]: [facility];  Service: Urology;  Laterality: Left;  . Hernia repair      x2  . Urinary sphincter implant    . Cystoscopy with retrograde pyelogram, ureteroscopy and stent placement Left 11/24/2012    Procedure: CYSTOSCOPY WITH left RETROGRADE PYELOGRAM, bladder washings, ureteroscopy;  Surgeon: [name], MD;  [facility];  Service: Urology;  Laterality: Left;  . Transurethral resection of bladder tumor N/A 11/19/2013    Procedure: TRANSURETHRAL RESECTION OF BLADDER TUMOR (TURBT);  Surgeon: [name], MD;  [facility];  Service: Urology;  Laterality: N/A;  . Cystoscopy with retrograde pyelogram, ureteroscopy and stent placement Bilateral 11/19/2013    Procedure: [place],;  Surgeon: [name], MD;  [facility];  Service: Urology;  Laterality: Bilateral;     Current Outpatient Prescriptions  Medication Sig Dispense Refill  . apixaban (ELIQUIS) 2.5 MG TABS tablet Take 1 tablet (2.5 mg total) by mouth 2 (two) times daily.  60 tablet 11  . aspirin 81 MG tablet Take 81 mg by mouth daily.    . B Complex-C (B-COMPLEX WITH VITAMIN C) tablet Take 1 tablet by mouth daily.    . methylcellulose (ARTIFICIAL TEARS) 1 % ophthalmic solution Place 1 drop into both eyes 2 (two) times daily.     . metoprolol succinate (TOPROL-XL) 25 MG 24 hr tablet Take 1 tablet (25 mg total) by mouth at bedtime. 90 tablet 0  . Multiple Vitamins-Minerals (PRESERVISION/LUTEIN PO) Take 1 tablet by mouth daily.    . pravastatin (PRAVACHOL) 20 MG tablet Take 1 tablet (20 mg total) by mouth at bedtime. 180 tablet 2   No current facility-administered medications for this visit.    Allergies:   Review of patient's allergies indicates no  known allergies.    Social History:  The patient  reports that he has been passively smoking Cigars.  He has never used smokeless tobacco. He reports that he drinks alcohol. He reports that he does not use illicit drugs.   Family History:  The patient's family history is negative for Colon cancer and Colon polyps.    ROS:  Please see the history of present illness.    Review of Systems: Constitutional:  denies fever, chills, diaphoresis, appetite change and fatigue.  HEENT: denies photophobia, eye pain, redness, hearing loss, ear pain, congestion, sore throat, rhinorrhea, sneezing, neck pain, neck stiffness and tinnitus.  Respiratory: denies SOB, DOE, cough, chest tightness, and wheezing.  Cardiovascular: denies chest pain, palpitations and leg swelling.  Gastrointestinal: denies nausea, vomiting, abdominal pain, diarrhea, constipation, blood in stool.  Genitourinary: denies dysuria, urgency, frequency, hematuria, flank pain and difficulty urinating.  Musculoskeletal: denies  myalgias, back pain, joint swelling, arthralgias and gait problem.   Skin: denies pallor, rash and wound.  Neurological: denies dizziness, seizures, syncope, weakness, light-headedness, numbness and headaches.   Hematological: denies adenopathy, easy bruising, personal or family bleeding history.  Psychiatric/ Behavioral: denies suicidal ideation, mood changes, confusion, nervousness, sleep disturbance and agitation.       All other systems are reviewed and negative.    PHYSICAL EXAM: VS:  BP 140/70 mmHg  Pulse 72  Ht 5\' 10"  (1.778 m)  Wt 126 lb 12.8 oz (57.516 kg)  BMI 18.19 kg/m2 , BMI Body mass index is 18.19 kg/(m^2). GEN: Well nourished, well developed, in no acute distress HEENT: normal Neck: no JVD, carotid bruits, or masses Cardiac: irreg Irreg. ;  Soft systolic murmur,  99991111 diastolic murmur c/w AI . no rubs, or gallops,no edema  Respiratory:  clear to auscultation bilaterally, normal work of  breathing GI: soft, nontender, nondistended, + BS MS: no deformity or atrophy Skin: warm and dry, no rash Neuro:  Strength and sensation are intact Psych: normal   EKG:  EKG is not ordered today. The ekg ordered 07/15/15  demonstrates Atrial fib with HR of 61.      Recent Labs: 07/15/2015: BUN 34*; Creatinine, Ser 0.97; Hemoglobin 14.3; Platelets 312.0; Potassium 5.3*; Sodium 140; TSH 0.97    Lipid Panel    Component Value Date/Time   CHOL 135 09/14/2013 0852   TRIG 62.0 09/14/2013 0852   HDL 47.00 09/14/2013 0852   CHOLHDL 3 09/14/2013 0852   VLDL 12.4 09/14/2013 0852   LDLCALC 76 09/14/2013 0852      Wt Readings from Last 3 Encounters:  07/16/15 126 lb 12.8 oz (57.516 kg)  07/15/15 126 lb 11.2 oz (57.471 kg)  12/11/14 134 lb (60.782 kg)  Other studies Reviewed: Additional studies/ records that were reviewed today include: . Review of the above records demonstrates:   Preliminary Echo results - LV function is low normal -  45-50% Moderate - severe AI Mild - mod MR Moderate TR with moderate pulmonary HTN Markedly enlarged right atrium. Moderately enlarged left atrium. At least moderate pulmonic valve insufficiency   ASSESSMENT AND PLAN:  1.  Atrial fib - his HR is very well controlled.   This may be contributing to his recent worsening symptoms but I suspect that the DOE is more due to his AI.  2. Aortic insufficiency -  Sounds like his has fairly severe AI.   Echo today  Will discuss with Dr. Angelena Form about possibility of TAVR Will need a TEE at some point  3. Pulmonary HTN:   At least moderate . Likely due to his valvular disease and low / normal LV function  Will start Lasix 40 a day and Kdur 10 meq a day  Will see him in 1 week for office and BMP   4. Mitral regurgitation - ? Mild - moderate   Will see him in 1 week for follow up visit    Current medicines are reviewed at length with the patient today.  The patient does not have concerns regarding  medicines.  The following changes have been made:  no change  Labs/ tests ordered today include:  No orders of the defined types were placed in this encounter.     Disposition:   FU with me in 1 week    Coleby Yett, [name], MD  07/16/2015 8:37 AM    [facility] [address] [place], [place], [place]  [address] Phone: [phone]; Fax: [phone]

## 2015-07-17 DIAGNOSIS — H353131 Nonexudative age-related macular degeneration, bilateral, early dry stage: Secondary | ICD-10-CM | POA: Diagnosis not present

## 2015-07-17 DIAGNOSIS — H35342 Macular cyst, hole, or pseudohole, left eye: Secondary | ICD-10-CM | POA: Diagnosis not present

## 2015-07-17 DIAGNOSIS — L308 Other specified dermatitis: Secondary | ICD-10-CM | POA: Diagnosis not present

## 2015-07-18 DIAGNOSIS — C678 Malignant neoplasm of overlapping sites of bladder: Secondary | ICD-10-CM | POA: Diagnosis not present

## 2015-07-18 DIAGNOSIS — C61 Malignant neoplasm of prostate: Secondary | ICD-10-CM | POA: Diagnosis not present

## 2015-07-18 DIAGNOSIS — Z0000 Encounter for general adult medical examination without abnormal findings: Secondary | ICD-10-CM | POA: Diagnosis not present

## 2015-07-18 DIAGNOSIS — C669 Malignant neoplasm of unspecified ureter: Secondary | ICD-10-CM | POA: Diagnosis not present

## 2015-07-21 ENCOUNTER — Telehealth: Payer: Self-pay | Admitting: General Practice

## 2015-07-21 NOTE — Telephone Encounter (Signed)
PA began for Eliquis in [place] (key # [id number])

## 2015-07-24 ENCOUNTER — Encounter: Payer: Self-pay | Admitting: Cardiovascular Disease

## 2015-07-24 ENCOUNTER — Encounter: Payer: Self-pay | Admitting: *Deleted

## 2015-07-24 ENCOUNTER — Encounter: Payer: Self-pay | Admitting: Adult Health

## 2015-07-24 ENCOUNTER — Telehealth: Payer: Self-pay | Admitting: *Deleted

## 2015-07-24 ENCOUNTER — Ambulatory Visit (INDEPENDENT_AMBULATORY_CARE_PROVIDER_SITE_OTHER): Payer: Medicare Other | Admitting: Cardiovascular Disease

## 2015-07-24 ENCOUNTER — Ambulatory Visit (INDEPENDENT_AMBULATORY_CARE_PROVIDER_SITE_OTHER): Payer: Medicare Other | Admitting: Adult Health

## 2015-07-24 VITALS — BP 134/72 | HR 72 | Temp 98.4°F | Wt 124.4 lb

## 2015-07-24 VITALS — BP 128/68 | HR 72 | Ht 70.0 in | Wt 125.8 lb

## 2015-07-24 DIAGNOSIS — I351 Nonrheumatic aortic (valve) insufficiency: Secondary | ICD-10-CM

## 2015-07-24 DIAGNOSIS — I482 Chronic atrial fibrillation, unspecified: Secondary | ICD-10-CM

## 2015-07-24 DIAGNOSIS — I272 Other secondary pulmonary hypertension: Secondary | ICD-10-CM | POA: Diagnosis not present

## 2015-07-24 DIAGNOSIS — I4891 Unspecified atrial fibrillation: Secondary | ICD-10-CM | POA: Diagnosis not present

## 2015-07-24 DIAGNOSIS — I359 Nonrheumatic aortic valve disorder, unspecified: Secondary | ICD-10-CM | POA: Diagnosis not present

## 2015-07-24 HISTORY — DX: Pulmonary hypertension, unspecified: I27.20

## 2015-07-24 LAB — CBC
HCT: 45.3 % (ref 38.5–50.0)
Hemoglobin: 15.1 g/dL (ref 13.2–17.1)
MCH: 31.3 pg (ref 27.0–33.0)
MCHC: 33.3 g/dL (ref 32.0–36.0)
MCV: 93.8 fL (ref 80.0–100.0)
MPV: 10.8 fL (ref 7.5–12.5)
PLATELETS: 206 10*3/uL (ref 140–400)
RBC: 4.83 MIL/uL (ref 4.20–5.80)
RDW: 13.7 % (ref 11.0–15.0)
WBC: 7.2 10*3/uL (ref 3.8–10.8)

## 2015-07-24 LAB — BASIC METABOLIC PANEL
BUN: 31 mg/dL — AB (ref 7–25)
CO2: 28 mmol/L (ref 20–31)
Calcium: 9.9 mg/dL (ref 8.6–10.3)
Chloride: 101 mmol/L (ref 98–110)
Creat: 1.08 mg/dL (ref 0.70–1.11)
Glucose, Bld: 90 mg/dL (ref 65–99)
POTASSIUM: 4.4 mmol/L (ref 3.5–5.3)
Sodium: 139 mmol/L (ref 135–146)

## 2015-07-24 LAB — PROTIME-INR
INR: 1.17 (ref <1.50)
Prothrombin Time: 15 seconds (ref 11.6–15.2)

## 2015-07-24 NOTE — Progress Notes (Signed)
Cardiology Office Note   Date:  07/24/2015   ID:  Jeremy Kelley, DOB 31-Dec-1926, MRN [id number]  PCP:  [name], NP  Cardiologist:   Thayer Headings, MD   No chief complaint on file.  Problem List 1. Atrial Fib  2. Hyperlipidemia 3. Raunauld 4. Carotid artery disease - 40-50% bilaterally    History of Present Illness: Jeremy Kelley is a 82 y.o. male who presents for further evaluation of atrial fib. He has noticed some fatigue and DOE for the past several months . Has DOE with very mild exertion  - ie walking [place] at the airport.   Denies any chest pain Retired from [employer].    Spends lots of time in [place]. [place]. Spends winters in [place] , [place] in [place] .   July 24, 2015:  Jeremy Kelley is seen back today for follow up visit . He presented with progressive DOE.  Echo showed:  Low normal LVF with EF 50-55%. Atrial fibrillation present.  Severe biatrial enlargement. Moderately dilated LV and moderately  reduced RVF. Moderate TR and PR and moderate pulmonary HTN with  PASP 73mmHg. Moderate diffuse AV thickening with moderate AR. The  right ventricular systolic pressure was increased consistent with  moderate pulmonary hypertension.   He was started on Lasix and potassium at his last office visit. His breathing is better but he still has significant fatigue  Has pain in the left side of his neck and intrascapular pain with walking  No CP .   Some shortness of breath - not much    Past Medical History  Diagnosis Date  . History of prostate cancer 2001  . Hypertension   . Colon polyps   . Cataract   . Phimosis   . Carotid artery stenosis 11/2008    bilateral 40-59% stenosis  . Hepatitis     doesn't know which type 1969  . Hyperlipidemia   . Numbness     fingertips  . 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 Diverticulosis     Past Surgical History  Procedure Laterality Date  . Inguinal hernia repair    .  Varicose vein surgery    . Other surgical history      s/p laparoscopic prostate surgery  . Skin cancer excision      a/p skin cancer right foot-non melanoma  . Bladder suspension  08/18/10    [name]  . Eye surgery  09/24/10    left eye. "Retina surgery"  . Transurethral resection of bladder tumor  10/01/2011    Procedure: TRANSURETHRAL RESECTION OF BLADDER TUMOR (TURBT);  Surgeon: [name], MD;  [facility];  Service: Urology;  Laterality: N/A;  . Biopsy  10/01/2011    Procedure: BIOPSY;  Surgeon: [name], MD;  [facility];  Service: Urology;;  biopsy of bladder tumor  . Ureteroscopy  01/27/2012    Procedure: URETEROSCOPY;  Surgeon: [name], MD;  [facility];  Service: Urology;  Laterality: Left;  CYSTO, Left RETROGRADE PYELOGRPHY, LEFT URETEROSCOPY   . Cystoscopy w/ retrogrades  01/27/2012    Procedure: CYSTOSCOPY WITH RETROGRADE PYELOGRAM;  Surgeon: [name], MD;  [facility];  Service: Urology;  Laterality: Left;  . Hernia repair      x2  . Urinary sphincter implant    . Cystoscopy with retrograde pyelogram, ureteroscopy and stent placement Left 11/24/2012    Procedure: CYSTOSCOPY WITH left RETROGRADE PYELOGRAM, bladder washings, ureteroscopy;  Surgeon: [name], MD;  [facility];  Service: Urology;  Laterality: Left;  . Transurethral resection of bladder tumor N/A 11/19/2013    Procedure: TRANSURETHRAL RESECTION OF BLADDER TUMOR (TURBT);  Surgeon: [name], MD;  [facility];  Service: Urology;  Laterality: N/A;  . Cystoscopy with retrograde pyelogram, ureteroscopy and stent placement Bilateral 11/19/2013    Procedure: [place],;  Surgeon: [name], MD;  [facility];  Service: Urology;  Laterality: Bilateral;     Current Outpatient Prescriptions  Medication Sig Dispense Refill  . apixaban (ELIQUIS) 2.5 MG TABS tablet Take 1 tablet (2.5 mg total) by mouth 2 (two) times daily. 60 tablet 11    . B Complex-C (B-COMPLEX WITH VITAMIN C) tablet Take 1 tablet by mouth daily.    . fluocinonide cream (LIDEX) [id number] % Apply 1 application topically 2 (two) times daily as needed (skin irritation).     . furosemide (LASIX) 40 MG tablet Take 1 tablet (40 mg total) by mouth daily. 30 tablet 11  . methylcellulose (ARTIFICIAL TEARS) 1 % ophthalmic solution Place 1 drop into both eyes 2 (two) times daily.     . metoprolol succinate (TOPROL-XL) 25 MG 24 hr tablet Take 1 tablet (25 mg total) by mouth at bedtime. 90 tablet 0  . Multiple Vitamins-Minerals (PRESERVISION/LUTEIN PO) Take 1 tablet by mouth daily.    . potassium chloride (K-DUR) 10 MEQ tablet Take 1 tablet (10 mEq total) by mouth daily. 30 tablet 11  . pravastatin (PRAVACHOL) 20 MG tablet Take 1 tablet (20 mg total) by mouth at bedtime. 180 tablet 2   No current facility-administered medications for this visit.    Allergies:   Review of patient's allergies indicates no known allergies.    Social History:  The patient  reports that he has been passively smoking Cigars.  He has never used smokeless tobacco. He reports that he drinks alcohol. He reports that he does not use illicit drugs.   Family History:  The patient's family history is negative for Colon cancer and Colon polyps.    ROS:  Please see the history of present illness.    Review of Systems: Constitutional:  denies fever, chills, diaphoresis, appetite change and fatigue.  HEENT: denies photophobia, eye pain, redness, hearing loss, ear pain, congestion, sore throat, rhinorrhea, sneezing, neck pain, neck stiffness and tinnitus.  Respiratory: denies SOB, DOE, cough, chest tightness, and wheezing.  Cardiovascular: denies chest pain, palpitations and leg swelling.  Gastrointestinal: denies nausea, vomiting, abdominal pain, diarrhea, constipation, blood in stool.  Genitourinary: denies dysuria, urgency, frequency, hematuria, flank pain and difficulty urinating.  Musculoskeletal:  denies  myalgias, back pain, joint swelling, arthralgias and gait problem.   Skin: denies pallor, rash and wound.  Neurological: denies dizziness, seizures, syncope, weakness, light-headedness, numbness and headaches.   Hematological: denies adenopathy, easy bruising, personal or family bleeding history.  Psychiatric/ Behavioral: denies suicidal ideation, mood changes, confusion, nervousness, sleep disturbance and agitation.       All other systems are reviewed and negative.    PHYSICAL EXAM: VS:  BP 128/68 mmHg  Pulse 72  Ht 5\' 10"  (1.778 m)  Wt 125 lb 12.8 oz (57.063 kg)  BMI 18.05 kg/m2  SpO2 98% , BMI Body mass index is 18.05 kg/(m^2). GEN: Well nourished, well developed, in no acute distress HEENT: normal Neck: no JVD, carotid bruits, or masses Cardiac: irreg Irreg. ;  Soft systolic murmur,  99991111 diastolic murmur c/w AI . no rubs, or  gallops,no edema  Respiratory:  clear to auscultation bilaterally, normal work of breathing GI: soft, nontender, nondistended, + BS MS: no deformity or atrophy Skin: warm and dry, no rash Neuro:  Strength and sensation are intact Psych: normal   EKG:  EKG is not ordered today. The ekg ordered 07/15/15  demonstrates Atrial fib with HR of 61.      Recent Labs: 07/15/2015: BUN 34*; Creatinine, Ser 0.97; Hemoglobin 14.3; Platelets 312.0; Potassium 5.3*; Sodium 140; TSH 0.97    Lipid Panel    Component Value Date/Time   CHOL 135 09/14/2013 0852   TRIG 62.0 09/14/2013 0852   HDL 47.00 09/14/2013 0852   CHOLHDL 3 09/14/2013 0852   VLDL 12.4 09/14/2013 0852   LDLCALC 76 09/14/2013 0852      Wt Readings from Last 3 Encounters:  07/24/15 125 lb 12.8 oz (57.063 kg)  07/16/15 126 lb 12.8 oz (57.516 kg)  07/15/15 126 lb 11.2 oz (57.471 kg)      Other studies Reviewed: Additional studies/ records that were reviewed today include: . Review of the above records demonstrates:   Preliminary Echo results - LV function is low normal -   45-50% Moderate - severe AI Mild - mod MR Moderate TR with moderate pulmonary HTN Markedly enlarged right atrium. Moderately enlarged left atrium. At least moderate pulmonic valve insufficiency   ASSESSMENT AND PLAN:  1.  Atrial fib - his HR is very well controlled.   This may be contributing to his recent worsening symptoms but I suspect that the DOE is more due to his AI.[name] He has significant right and left atrial enlargement. I doubt that he would hold normal sinus rhythm. If he does it up needing surgery, we can consider a Maze procedure.  2. Aortic insufficiency -  he has at least moderate aortic insufficiency. I would like to get a transesophageal echo to see if perhaps it is more severe than that.   He also has mitral regurgitation and tricuspid regurgitation. We'll also be assessing him with a heart cath.   3. Pulmonary HTN:   At least moderate . Likely due to his valvular disease and low / normal LV function  Currently on  Lasix 40 a day and Kdur 10 meq a day  - seems to be helping some .  Will see him in 1 week for office and BMP   4. Mitral regurgitation - ? Mild - moderate   We'll schedule him for a heart catheterization including a right left heart catheterization with an aortogram. We discussed the risks, benefits, and options of heart catheterization and transesophageal echo. He understands and agrees to proceed. We'll schedule these for the same day next week.  Current medicines are reviewed at length with the patient today.  The patient does not have concerns regarding medicines.  The following changes have been made:  no change  Labs/ tests ordered today include:  No orders of the defined types were placed in this encounter.     Disposition:   FU with me in 1 month    Nahser, [name], MD  07/24/2015 8:13 AM    [facility] [place], [place], [place]  [address] Phone: [phone]; Fax: [phone]

## 2015-07-24 NOTE — Progress Notes (Signed)
Subjective:    Patient ID: Jeremy Kelley, male    DOB: [birth year], 82 y.o.   MRN: [id number]  HPI  82 year old male who presents to the office today to discuss his upcoming cardiac procedures. He has a heart cath with an aortogram and possible valve replacement coming up.   He voices no real concerns about the procedures.   He was started on Lasix and potassium during his last cardiac visit. He reports that his breathing has improved but he continues to has some shortness of breath with exertion.   He denies any chest pain.   His only real complaint is that of frequent urination due to Lasix use.  Review of Systems  Constitutional: Negative.   HENT: Negative.   Respiratory: Negative.   Genitourinary: Positive for urgency and frequency.  Neurological: Negative.   All other systems reviewed and are negative.  Past Medical History  Diagnosis Date  . History of prostate cancer 2001  . Hypertension   . Colon polyps   . Cataract   . Phimosis   . Carotid artery stenosis 11/2008    bilateral 40-59% stenosis  . Hepatitis     doesn't know which type 1969  . Hyperlipidemia   . Numbness     fingertips  . 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 Diverticulosis     Social History   Social History  . Marital Status: Single    Spouse Name: N/A  . Number of Children: 0  . Years of Education: N/A   Occupational History  . Retired    Social History Main Topics  . Smoking status: Passive Smoke Exposure - Never Smoker    Types: Cigars  . Smokeless tobacco: Never Used     Comment: occasionally smokes a cigar  . Alcohol Use: 0.0 oz/week    6-7 Glasses of wine per week     Comment: 3-4 glasses of wine per week-no history of excessinve alcohol use  . Drug Use: No  . Sexual Activity: Not on file   Other Topics Concern  . Not on file   Social History Narrative   Patient is single, has never been married.  Does not have any children.  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   He drinks approximately 3-4 glasses of wine per week.  No history of excessive alcohol use.  Occasionally smokes a cigar.   Spends winters in [place]         Past Surgical History  Procedure Laterality Date  . Inguinal hernia repair    . Varicose vein surgery    . Other surgical history      s/p laparoscopic prostate surgery  . Skin cancer excision      a/p skin cancer right foot-non melanoma  . Bladder suspension  08/18/10    [name]  . Eye surgery  09/24/10    left eye. "Retina surgery"  . Transurethral resection of bladder tumor  10/01/2011    Procedure: TRANSURETHRAL RESECTION OF BLADDER TUMOR (TURBT);  Surgeon: [name], MD;  Location: [facility];  Service: Urology;  Laterality: N/A;  . Biopsy  10/01/2011    Procedure: BIOPSY;  Surgeon: [name], MD;  Location: [facility];  Service: Urology;;  biopsy of bladder tumor  . Ureteroscopy  01/27/2012    Procedure: URETEROSCOPY;  Surgeon: [name], MD;  Location: [facility];  Service: Urology;  Laterality: Left;  CYSTO, Left RETROGRADE PYELOGRPHY, LEFT URETEROSCOPY   .  Cystoscopy w/ retrogrades  01/27/2012    Procedure: CYSTOSCOPY WITH RETROGRADE PYELOGRAM;  Surgeon: [name], MD;  Location: [facility];  Service: Urology;  Laterality: Left;  . Hernia repair      x2  . Urinary sphincter implant    . Cystoscopy with retrograde pyelogram, ureteroscopy and stent placement Left 11/24/2012    Procedure: CYSTOSCOPY WITH left RETROGRADE PYELOGRAM, bladder washings, ureteroscopy;  Surgeon: [name], MD;  Location: [facility];  Service: Urology;  Laterality: Left;  . Transurethral resection of bladder tumor N/A 11/19/2013    Procedure: TRANSURETHRAL RESECTION OF BLADDER TUMOR (TURBT);  Surgeon: [name], MD;  Location: [facility];  Service: Urology;  Laterality: N/A;  . Cystoscopy with retrograde pyelogram, ureteroscopy and stent placement Bilateral 11/19/2013    Procedure: Parker,;  Surgeon: [name], MD;  Location: [facility];  Service: Urology;  Laterality: Bilateral;    Family History  Problem Relation Age of Onset  . Colon cancer Neg Hx   . Colon polyps Neg Hx     No Known Allergies  Current Outpatient Prescriptions on File Prior to Visit  Medication Sig Dispense Refill  . apixaban (ELIQUIS) 2.5 MG TABS tablet Take 1 tablet (2.5 mg total) by mouth 2 (two) times daily. 60 tablet 11  . furosemide (LASIX) 40 MG tablet Take 1 tablet (40 mg total) by mouth daily. 30 tablet 11  . methylcellulose (ARTIFICIAL TEARS) 1 % ophthalmic solution Place 1 drop into both eyes 2 (two) times daily.     . metoprolol succinate (TOPROL-XL) 25 MG 24 hr tablet Take 1 tablet (25 mg total) by mouth at bedtime. 90 tablet 0  . Multiple Vitamins-Minerals (PRESERVISION/LUTEIN PO) Take 1 tablet by mouth daily.    . potassium chloride (K-DUR) 10 MEQ tablet Take 1 tablet (10 mEq total) by mouth daily. 30 tablet 11  . pravastatin (PRAVACHOL) 20 MG tablet Take 1 tablet (20 mg total) by mouth at bedtime. 180 tablet 2  . B Complex-C (B-COMPLEX WITH VITAMIN C) tablet Take 1 tablet by mouth daily. Reported on 07/24/2015     No current facility-administered medications on file prior to visit.    BP 134/72 mmHg  Pulse 72  Temp(Src) 98.4 F (36.9 C) (Oral)  Wt 124 lb 6.4 oz (56.427 kg)  SpO2 97%       Objective:   Physical Exam  Constitutional: He is oriented to person, place, and time. He appears well-developed and well-nourished. No distress.  Cardiovascular: Normal rate and intact distal pulses.  An irregularly irregular rhythm present. Exam reveals no gallop and no friction rub.   Murmur heard.  Systolic murmur is present with a grade of 3/6  Pulmonary/Chest: Effort normal and breath sounds normal. No respiratory distress. He has no wheezes. He has no rales. He exhibits no tenderness.  Musculoskeletal: Normal range of motion. He exhibits no edema or tenderness.    Neurological: He is alert and oriented to person, place, and time.  Skin: Skin is warm and dry. No rash noted. He is not diaphoretic. No erythema. No pallor.  Psychiatric: He has a normal mood and affect. His behavior is normal. Judgment and thought content normal.  Vitals reviewed.     Assessment & Plan:  1. Chronic atrial fibrillation (HCC) - HR is well controlled. Has started taking Eliquis 2.5 mg .  - Follow up with appointment and procedures as directed.  - Follow up with me as needed.   [name], NP

## 2015-07-24 NOTE — Patient Instructions (Signed)
Medication Instructions:  Your physician recommends that you continue on your current medications as directed. Please refer to the Current Medication list given to you today.   Labwork: TODAY:  BMET, CBC, & PTINR  Testing/Procedures: Your physician has requested that you have a TEE. During a TEE, sound waves are used to create images of your heart. It provides your doctor with information about the size and shape of your heart and how well your heart's chambers and valves are working. In this test, a transducer is attached to the end of a flexible tube that's guided down your throat and into your esophagus (the tube leading from you mouth to your stomach) to get a more detailed image of your heart. You are not awake for the procedure. Please see the instruction sheet given to you today. For further information please visit [email or web address].   Your physician has requested that you have a cardiac catheterization. Cardiac catheterization is used to diagnose and/or treat various heart conditions. Doctors may recommend this procedure for a number of different reasons. The most common reason is to evaluate chest pain. Chest pain can be a symptom of coronary artery disease (CAD), and cardiac catheterization can show whether plaque is narrowing or blocking your heart's arteries. This procedure is also used to evaluate the valves, as well as measure the blood flow and oxygen levels in different parts of your heart. For further information please visit [email or web address]. Please follow instruction sheet, as given.    Follow-Up: Your physician recommends that you schedule a follow-up appointment in: [place] Acie Fredrickson   Any Other Special Instructions Will Be Listed Below (If Applicable). Coronary Angiogram A coronary angiogram, also called coronary angiography, is an X-ray procedure used to look at the arteries in the heart. In this procedure, a dye (contrast dye) is injected through a long,  hollow tube (catheter). The catheter is about the size of a piece of cooked spaghetti and is inserted through your groin, wrist, or arm. The dye is injected into each artery, and X-rays are then taken to show if there is a blockage in the arteries of your heart. LET [facility] CARE PROVIDER KNOW ABOUT:  Any allergies you have, including allergies to shellfish or contrast dye.   All medicines you are taking, including vitamins, herbs, eye drops, creams, and over-the-counter medicines.   Previous problems you or members of your family have had with the use of anesthetics.   Any blood disorders you have.   Previous surgeries you have had.  History of kidney problems or failure.   Other medical conditions you have. RISKS AND COMPLICATIONS  Generally, a coronary angiogram is a safe procedure. However, problems can occur and include:  Allergic reaction to the dye.  Bleeding from the access site or other locations.  Kidney injury, especially in people with impaired kidney function.  Stroke (rare).  Heart attack (rare). BEFORE THE PROCEDURE   Do not eat or drink anything after midnight the night before the procedure or as directed by your health care provider.   Ask your health care provider about changing or stopping your regular medicines. This is especially important if you are taking diabetes medicines or blood thinners. PROCEDURE  You may be given a medicine to help you relax (sedative) before the procedure. This medicine is given through an intravenous (IV) access tube that is inserted into one of your veins.   The area where the catheter will be inserted will be washed and shaved. This is  usually done in the groin but may be done in the fold of your arm (near your elbow) or in the wrist.   A medicine will be given to numb the area where the catheter will be inserted (local anesthetic).   The health care provider will insert the catheter into an artery. The catheter  will be guided by using a special type of X-ray (fluoroscopy) of the blood vessel being examined.   A special dye will then be injected into the catheter, and X-rays will be taken. The dye will help to show where any narrowing or blockages are located in the heart arteries.  AFTER THE PROCEDURE   If the procedure is done through the leg, you will be kept in bed lying flat for several hours. You will be instructed to not bend or cross your legs.  The insertion site will be checked frequently.   The pulse in your feet or wrist will be checked frequently.   Additional blood tests, X-rays, and an electrocardiogram may be done.    This information is not intended to replace advice given to you by your health care provider. Make sure you discuss any questions you have with your health care provider.   Document Released: 10/03/2002 Document Revised: 04/19/2014 Document Reviewed: 08/21/2012 Elsevier Interactive Patient Education 2016 [place].   Transesophageal Echocardiogram Transesophageal echocardiography (TEE) is a special type of test that produces images of the heart by using sound waves (echocardiogram). This type of echocardiography can obtain better images of the heart than standard echocardiography. TEE is done by passing a flexible tube down the esophagus. The heart is located in front of the esophagus. Because the heart and esophagus are close to one another, your health care provider can take very clear, detailed pictures of the heart via ultrasound waves. TEE may be done:  If your health care provider needs more information based on standard echocardiography findings.  If you had a stroke. This might have happened because a clot formed in your heart. TEE can visualize different areas of the heart and check for clots.  To check valve anatomy and function.  To check for infection on the inside of your heart (endocarditis).  To evaluate the dividing wall (septum) of the  heart and presence of a hole that did not close after birth (patent foramen ovale or atrial septal defect).  To help diagnose a tear in the wall of the aorta (aortic dissection).  During cardiac valve surgery. This allows the surgeon to assess the valve repair before closing the chest.  During a variety of other cardiac procedures to guide positioning of catheters.  Sometimes before a cardioversion, which is a shock to convert heart rhythm back to normal. LET [facility] CARE PROVIDER KNOW ABOUT:   Any allergies you have.  All medicines you are taking, including vitamins, herbs, eye drops, creams, and over-the-counter medicines.  Previous problems you or members of your family have had with the use of anesthetics.  Any blood disorders you have.  Previous surgeries you have had.  Medical conditions you have.  Swallowing difficulties.  An esophageal obstruction. RISKS AND COMPLICATIONS  Generally, TEE is a safe procedure. However, as with any procedure, complications can occur. Possible complications include an esophageal tear (rupture). BEFORE THE PROCEDURE   Do not eat or drink for 6 hours before the procedure or as directed by your health care provider.  Arrange for someone to drive you home after the procedure. Do not drive yourself home.  During the procedure, you will be given medicines that can continue to make you feel drowsy and can impair your reflexes.  An IV access tube will be started in the arm. PROCEDURE   A medicine to help you relax (sedative) will be given through the IV access tube.  A medicine may be sprayed or gargled to numb the back of the throat.  Your blood pressure, heart rate, and breathing (vital signs) will be monitored during the procedure.  The TEE probe is a long, flexible tube. The tip of the probe is placed into the back of the mouth, and you will be asked to swallow. This helps to pass the tip of the probe into the esophagus. Once the tip of  the probe is in the correct area, your health care provider can take pictures of the heart.  TEE is usually not a painful procedure. You may feel the probe press against the back of the throat. The probe does not enter the trachea and does not affect your breathing. AFTER THE PROCEDURE   You will be in bed, resting, until you have fully returned to consciousness.  When you first awaken, your throat may feel slightly sore and will probably still feel numb. This will improve slowly over time.  You will not be allowed to eat or drink until it is clear that the numbness has improved.  Once you have been able to drink, urinate, and sit on the edge of the bed without feeling sick to your stomach (nausea) or dizzy, you may be cleared to go home.  You should have a friend or family member with you for the next 24 hours after your procedure.   This information is not intended to replace advice given to you by your health care provider. Make sure you discuss any questions you have with your health care provider.   Document Released: 06/19/2002 Document Revised: 04/03/2013 Document Reviewed: 09/28/2012 Elsevier Interactive Patient Education [employer].    If you need a refill on your cardiac medications before your next appointment, please call your pharmacy.

## 2015-07-24 NOTE — Telephone Encounter (Signed)
Called pt to make sure he understands to stop his Eliquis 2 days prior to his procedure.  He will not take it on 07/27/15 or 07/28/15. Pt verbalized understanding.

## 2015-07-25 ENCOUNTER — Telehealth: Payer: Self-pay | Admitting: Cardiovascular Disease

## 2015-07-25 NOTE — Telephone Encounter (Signed)
Pt has a procedure 07-29-15 and was told he may need to stay overnight-pt requesting to stay because he lives alone and doesn't have anyone to help him-pls call and let jim know if this can be arranged @[phone]

## 2015-07-25 NOTE — Telephone Encounter (Signed)
Jeremy Kelley states that they (hospital staff) will arrange for the pt to stay over night in the hospital post procedures on 07/29/15.  The pt is advised that he will be staying overnight in the hospital after his procedures on 07/29/15. He verbalized understanding and thanked me for my help.  Dr Acie Fredrickson is aware.

## 2015-07-25 NOTE — Telephone Encounter (Signed)
**Note De-Identified  Obfuscation** Per Dr Acie Fredrickson I have paged Trish, cardholder, for advisement on getting the pt an Observation admission. Waiting on her call back.

## 2015-07-29 ENCOUNTER — Ambulatory Visit (HOSPITAL_COMMUNITY)
Admission: RE | Admit: 2015-07-29 | Discharge: 2015-07-30 | Disposition: A | Discharge status: Home / Self Care | Payer: Medicare Other | Source: Ambulatory Visit | Attending: Cardiology | Admitting: Cardiology

## 2015-07-29 ENCOUNTER — Encounter (HOSPITAL_COMMUNITY)
Admission: RE | Disposition: A | Discharge status: Home / Self Care | Payer: Self-pay | Source: Ambulatory Visit | Attending: Cardiology

## 2015-07-29 ENCOUNTER — Encounter (HOSPITAL_COMMUNITY): Payer: Self-pay | Admitting: *Deleted

## 2015-07-29 ENCOUNTER — Ambulatory Visit (HOSPITAL_BASED_OUTPATIENT_CLINIC_OR_DEPARTMENT_OTHER)
Admission: RE | Admit: 2015-07-29 | Discharge: 2015-07-29 | Disposition: A | Discharge status: Home / Self Care | Payer: Medicare Other | Source: Ambulatory Visit | Admitting: Internal Medicine

## 2015-07-29 DIAGNOSIS — I10 Essential (primary) hypertension: Secondary | ICD-10-CM | POA: Diagnosis not present

## 2015-07-29 DIAGNOSIS — I482 Chronic atrial fibrillation, unspecified: Secondary | ICD-10-CM | POA: Diagnosis present

## 2015-07-29 DIAGNOSIS — Z7901 Long term (current) use of anticoagulants: Secondary | ICD-10-CM | POA: Insufficient documentation

## 2015-07-29 DIAGNOSIS — I272 Other secondary pulmonary hypertension: Secondary | ICD-10-CM | POA: Insufficient documentation

## 2015-07-29 DIAGNOSIS — E785 Hyperlipidemia, unspecified: Secondary | ICD-10-CM | POA: Diagnosis not present

## 2015-07-29 DIAGNOSIS — I081 Rheumatic disorders of both mitral and tricuspid valves: Secondary | ICD-10-CM | POA: Insufficient documentation

## 2015-07-29 DIAGNOSIS — Z8550 Personal history of malignant neoplasm of unspecified urinary tract organ: Secondary | ICD-10-CM | POA: Insufficient documentation

## 2015-07-29 DIAGNOSIS — Z8546 Personal history of malignant neoplasm of prostate: Secondary | ICD-10-CM | POA: Diagnosis not present

## 2015-07-29 DIAGNOSIS — I4891 Unspecified atrial fibrillation: Secondary | ICD-10-CM | POA: Diagnosis present

## 2015-07-29 DIAGNOSIS — I351 Nonrheumatic aortic (valve) insufficiency: Secondary | ICD-10-CM

## 2015-07-29 HISTORY — PX: CARDIAC CATHETERIZATION: SHX172

## 2015-07-29 HISTORY — DX: Malignant neoplasm of prostate: C61

## 2015-07-29 HISTORY — PX: TEE WITHOUT CARDIOVERSION: SHX5443

## 2015-07-29 LAB — POCT I-STAT 3, VENOUS BLOOD GAS (G3P V)
Acid-Base Excess: 3 mmol/L — ABNORMAL HIGH (ref 0.0–2.0)
Bicarbonate: 30.1 mEq/L — ABNORMAL HIGH (ref 20.0–24.0)
O2 Saturation: 60 %
PCO2 VEN: 55.1 mmHg — AB (ref 45.0–50.0)
PO2 VEN: 34 mmHg (ref 31.0–45.0)
TCO2: 32 mmol/L (ref 0–100)
pH, Ven: 7.346 — ABNORMAL HIGH (ref 7.250–7.300)

## 2015-07-29 LAB — POCT I-STAT 3, ART BLOOD GAS (G3+)
ACID-BASE EXCESS: 3 mmol/L — AB (ref 0.0–2.0)
BICARBONATE: 28.8 meq/L — AB (ref 20.0–24.0)
O2 SAT: 96 %
TCO2: 30 mmol/L (ref 0–100)
pCO2 arterial: 47.8 mmHg — ABNORMAL HIGH (ref 35.0–45.0)
pH, Arterial: 7.389 (ref 7.350–7.450)
pO2, Arterial: 81 mmHg (ref 80.0–100.0)

## 2015-07-29 SURGERY — ECHOCARDIOGRAM, TRANSESOPHAGEAL
Anesthesia: Moderate Sedation

## 2015-07-29 SURGERY — RIGHT/LEFT HEART CATH AND CORONARY ANGIOGRAPHY
Anesthesia: LOCAL

## 2015-07-29 MED ORDER — SODIUM CHLORIDE 0.9% FLUSH: 3.0000 mL | Freq: Two times a day (BID) | INTRAVENOUS | Status: DC

## 2015-07-29 MED ORDER — MIDAZOLAM HCL 5 MG/ML IJ SOLN
INTRAMUSCULAR | Status: AC
  Filled 2015-07-29: qty 2

## 2015-07-29 MED ORDER — POLYVINYL ALCOHOL 1.4 % OP SOLN
1.0000 [drp] | Freq: Two times a day (BID) | OPHTHALMIC | Status: DC
  Administered 2015-07-29: 1 [drp] via OPHTHALMIC
  Filled 2015-07-29: qty 15

## 2015-07-29 MED ORDER — ACETAMINOPHEN 325 MG PO TABS: 650.0000 mg | ORAL_TABLET | ORAL | Status: DC | PRN

## 2015-07-29 MED ORDER — SODIUM CHLORIDE 0.9% FLUSH: 3.0000 mL | INTRAVENOUS | Status: DC | PRN

## 2015-07-29 MED ORDER — LIDOCAINE VISCOUS 2 % MT SOLN
OROMUCOSAL | Status: AC
  Filled 2015-07-29: qty 15

## 2015-07-29 MED ORDER — MIDAZOLAM HCL 10 MG/2ML IJ SOLN
INTRAMUSCULAR | Status: DC | PRN
  Administered 2015-07-29: 1 mg via INTRAVENOUS
  Administered 2015-07-29: 2 mg via INTRAVENOUS

## 2015-07-29 MED ORDER — POTASSIUM CHLORIDE ER 10 MEQ PO TBCR
10.0000 meq | EXTENDED_RELEASE_TABLET | Freq: Every day | ORAL | Status: DC
  Administered 2015-07-30: 10 meq via ORAL
  Filled 2015-07-29 (×2): qty 1

## 2015-07-29 MED ORDER — HEPARIN (PORCINE) IN NACL 2-0.9 UNIT/ML-% IJ SOLN
INTRAMUSCULAR | Status: AC
  Filled 2015-07-29: qty 1000

## 2015-07-29 MED ORDER — LIDOCAINE HCL (PF) 1 % IJ SOLN
INTRAMUSCULAR | Status: DC | PRN
  Administered 2015-07-29: 20 mL

## 2015-07-29 MED ORDER — FUROSEMIDE 40 MG PO TABS
40.0000 mg | ORAL_TABLET | Freq: Every day | ORAL | Status: DC
  Administered 2015-07-30: 40 mg via ORAL
  Filled 2015-07-29: qty 1

## 2015-07-29 MED ORDER — FENTANYL CITRATE (PF) 100 MCG/2ML IJ SOLN
INTRAMUSCULAR | Status: AC
  Filled 2015-07-29: qty 2

## 2015-07-29 MED ORDER — SODIUM CHLORIDE 0.9 % IV SOLN: 250.0000 mL | INTRAVENOUS | Status: DC | PRN

## 2015-07-29 MED ORDER — HEPARIN (PORCINE) IN NACL 2-0.9 UNIT/ML-% IJ SOLN
INTRAMUSCULAR | Status: DC | PRN
  Administered 2015-07-29: 1000 mL

## 2015-07-29 MED ORDER — ASPIRIN 81 MG PO CHEW
81.0000 mg | CHEWABLE_TABLET | ORAL | Status: DC
  Filled 2015-07-29: qty 1

## 2015-07-29 MED ORDER — LIDOCAINE VISCOUS 2 % MT SOLN
OROMUCOSAL | Status: DC | PRN
Start: 2015-07-29 — End: 2015-07-29
  Administered 2015-07-29: 1 via OROMUCOSAL

## 2015-07-29 MED ORDER — SODIUM CHLORIDE 0.9 % WEIGHT BASED INFUSION: 1.0000 mL/kg/h | INTRAVENOUS | Status: DC

## 2015-07-29 MED ORDER — ONDANSETRON HCL 4 MG/2ML IJ SOLN: 4.0000 mg | Freq: Four times a day (QID) | INTRAMUSCULAR | Status: DC | PRN

## 2015-07-29 MED ORDER — SODIUM CHLORIDE 0.9 % WEIGHT BASED INFUSION: 3.0000 mL/kg/h | INTRAVENOUS | Status: DC

## 2015-07-29 MED ORDER — PRAVASTATIN SODIUM 20 MG PO TABS
20.0000 mg | ORAL_TABLET | Freq: Every day | ORAL | Status: DC
  Administered 2015-07-29: 20 mg via ORAL
  Filled 2015-07-29: qty 1

## 2015-07-29 MED ORDER — METOPROLOL SUCCINATE ER 25 MG PO TB24: 25.0000 mg | ORAL_TABLET | Freq: Every day | ORAL | Status: DC

## 2015-07-29 MED ORDER — LIDOCAINE HCL (PF) 1 % IJ SOLN
INTRAMUSCULAR | Status: AC
  Filled 2015-07-29: qty 30

## 2015-07-29 MED ORDER — SODIUM CHLORIDE 0.9 % WEIGHT BASED INFUSION
3.0000 mL/kg/h | INTRAVENOUS | Status: AC
  Administered 2015-07-29: 3 mL/kg/h via INTRAVENOUS

## 2015-07-29 MED ORDER — IOPAMIDOL (ISOVUE-370) INJECTION 76%
INTRAVENOUS | Status: DC | PRN
  Administered 2015-07-29: 95 mL via INTRA_ARTERIAL

## 2015-07-29 MED ORDER — IOPAMIDOL (ISOVUE-370) INJECTION 76%
INTRAVENOUS | Status: AC
  Filled 2015-07-29: qty 100

## 2015-07-29 MED ORDER — SODIUM CHLORIDE 0.9 % IV SOLN
INTRAVENOUS | Status: DC
  Administered 2015-07-29: 08:00:00 via INTRAVENOUS

## 2015-07-29 MED ORDER — SODIUM CHLORIDE 0.9% FLUSH
3.0000 mL | Freq: Two times a day (BID) | INTRAVENOUS | Status: DC
  Administered 2015-07-29 – 2015-07-30 (×2): 3 mL via INTRAVENOUS

## 2015-07-29 MED ORDER — BUTAMBEN-TETRACAINE-BENZOCAINE 2-2-14 % EX AERO
INHALATION_SPRAY | CUTANEOUS | Status: DC | PRN
  Administered 2015-07-29: 2 via TOPICAL

## 2015-07-29 MED ORDER — FENTANYL CITRATE (PF) 100 MCG/2ML IJ SOLN
INTRAMUSCULAR | Status: DC | PRN
  Administered 2015-07-29: 25 ug via INTRAVENOUS

## 2015-07-29 SURGICAL SUPPLY — 10 items
CATH INFINITI 5FR MULTPACK ANG (CATHETERS) ×1 IMPLANT
CATH SWAN GANZ 7F STRAIGHT (CATHETERS) ×1 IMPLANT
KIT HEART LEFT (KITS) ×2 IMPLANT
PACK CARDIAC CATHETERIZATION (CUSTOM PROCEDURE TRAY) ×2 IMPLANT
SHEATH PINNACLE 5F 10CM (SHEATH) ×1 IMPLANT
SHEATH PINNACLE 7F 10CM (SHEATH) ×1 IMPLANT
SYR MEDRAD MARK V 150ML (SYRINGE) ×2 IMPLANT
TRANSDUCER W/STOPCOCK (MISCELLANEOUS) ×3 IMPLANT
WIRE EMERALD 3MM-J .025X260CM (WIRE) ×1 IMPLANT
WIRE EMERALD 3MM-J .035X150CM (WIRE) ×1 IMPLANT

## 2015-07-29 NOTE — H&P (Signed)
     INTERVAL PROCEDURE H&P  History and Physical Interval Note:  07/29/2015 8:00 AM  Jeremy Kelley has presented today for their planned procedure. The various methods of treatment have been discussed with the patient and family. After consideration of risks, benefits and other options for treatment, the patient has consented to the procedure.  The patients' outpatient history has been reviewed, patient examined, and no change in status from most recent office note within the past 30 days. I have reviewed the patients' chart and labs and will proceed as planned. Questions were answered to the patient's satisfaction.   Pixie Casino, MD, [facility] Attending Cardiologist [place] C Ash Mcelwain 07/29/2015, 8:00 AM

## 2015-07-29 NOTE — CV Procedure (Signed)
TRANSESOPHAGEAL ECHOCARDIOGRAM (TEE) NOTE  INDICATIONS: Aortic insufficiency  PROCEDURE:   Informed consent was obtained prior to the procedure. The risks, benefits and alternatives for the procedure were discussed and the patient comprehended these risks.  Risks include, but are not limited to, cough, sore throat, vomiting, nausea, somnolence, esophageal and stomach trauma or perforation, bleeding, low blood pressure, aspiration, pneumonia, infection, trauma to the teeth and death.    After a procedural time-out, the patient was given 3 mg versed and 25 mcg fentanyl for moderate sedation.  The patient's heart rate, blood pressure, and oxygen saturation are monitored continuously during the procedure.The oropharynx was anesthetized 10 cc of topical 1% viscous lidocaine and 2 cetacaine sprays.  The transesophageal probe was inserted in the esophagus and stomach without difficulty and multiple views were obtained.  The patient was kept under observation until the patient left the procedure room.  The period of conscious sedation is 17 minutes, of which I was present face-to-face 100% of this time. The patient left the procedure room in stable condition.   Agitated microbubble saline contrast was not administered.  COMPLICATIONS:    There were no immediate complications. The patient reported a history of esophageal stricture with dilation about 1 year ago. He denied swallowing difficulty and the probe was passed easily.  Findings:  1. LEFT VENTRICLE: The left ventricular wall thickness is mildly increased.  The left ventricular cavity is normal in size. Wall motion is normal.  LVEF is 50-55%.  2. RIGHT VENTRICLE:  The right ventricle is normal in structure and function without any thrombus or masses.    3. LEFT ATRIUM:  The left atrium is severely dilated in size without any thrombus or masses.  There is not spontaneous echo contrast ("smoke") in the left atrium consistent with a low flow  state.  4. LEFT ATRIAL APPENDAGE:  The left atrial appendage very large and windsock in appearance, rotated anteriorly. The appendage has single lobes. Pulse doppler indicates moderate flow in the appendage.  5. ATRIAL SEPTUM:  The atrial septum appears intact and is free of thrombus and/or masses.  There is no evidence for interatrial shunting by color doppler.  6. RIGHT ATRIUM:  The right atrium is severely dilated without any thrombus or masses.  7. MITRAL VALVE:  The mitral valve is calcified around the annulus with Mild regurgitation.  There were no vegetations or stenosis.  8. AORTIC VALVE:  The aortic valve is trileaflet, with thickening of the leaflet borders but no stenosis. There is Moderate central regurgitation - the vena contracta is 6 mm.  There were no vegetations or stenosis  9. TRICUSPID VALVE:  The tricuspid valve is normal in structure and function with trivial regurgitation.  There were no vegetations or stenosis  10.  PULMONIC VALVE:  The pulmonic valve is normal in structure and function with Moderate central regurgitation.  The pulmonary artery is dilated - measuring larger than the ascending aorta.   11. AORTIC ARCH, ASCENDING AND DESCENDING AORTA:  There was grade 1 Ron Parker et. Al, 1992) atherosclerosis of the proximal descending aorta.  12. PULMONARY VEINS: Anomalous pulmonary venous return was not noted.  13. PERICARDIUM: The pericardium appeared normal and non-thickened.  There is no pericardial effusion.  IMPRESSION:   1. Moderate central AI 2. Moderate PR 3. Mild MR, mild MAC 4. Trivial TR 5. Severe biatrial enlargement 6. No LAA thrombus 7. LVEF 50-55%  RECOMMENDATIONS:    1. Moderate AI at best with normal LV dimensions -  no indication for aortic valve surgery. 2. Patient to have heart catheterization later today as well.  Time Spent Directly with the Patient:  30 minutes   Pixie Casino, MD, [facility] Attending Cardiologist [facility]  07/29/2015, 8:45 AM

## 2015-07-29 NOTE — Interval H&P Note (Signed)
History and Physical Interval Note:  07/29/2015 9:27 AM  Jeremy Kelley  has presented today for surgery, with the diagnosis of jaw pain  The various methods of treatment have been discussed with the patient and family. After consideration of risks, benefits and other options for treatment, the patient has consented to  Procedure(s): Right/Left Heart Cath and Coronary Angiography (N/A) as a surgical intervention .  The patient's history has been reviewed, patient examined, no change in status, stable for surgery.  I have reviewed the patient's chart and labs.  Questions were answered to the patient's satisfaction.    Cath Lab Visit (complete for each Cath Lab visit)  Clinical Evaluation Leading to the Procedure:   ACS: No.  Non-ACS:    Anginal Classification: CCS III  Anti-ischemic medical therapy: Minimal Therapy (1 class of medications)  Non-Invasive Test Results: No non-invasive testing performed  Prior CABG: No previous CABG       Steve Gregg [place] MD,FACC 07/29/2015 9:27 AM

## 2015-07-29 NOTE — H&P (View-Only) (Signed)
Cardiology Office Note   Date:  07/24/2015   ID:  Jeremy Kelley, DOB June 15, 1926, MRN [id number]  PCP:  [name], NP  Cardiologist:   Thayer Headings, MD   No chief complaint on file.  Problem List 1. Atrial Fib  2. Hyperlipidemia 3. [place] 4. Carotid artery disease - 40-50% bilaterally    History of Present Illness: Jeremy Kelley is a 82 y.o. male who presents for further evaluation of atrial fib. He has noticed some fatigue and DOE for the past several months . Has DOE with very mild exertion  - ie [place] at the airport.   Denies any chest pain Retired from [employer].    Spends lots of time in [place]. [place]. Spends winters in [place] , [place] in [place] .   July 24, 2015:  Jeremy Kelley is seen back today for follow up visit . He presented with progressive DOE.  Echo showed:  Low normal LVF with EF 50-55%. Atrial fibrillation present.  Severe biatrial enlargement. Moderately dilated LV and moderately  reduced RVF. Moderate TR and PR and moderate pulmonary HTN with  PASP 69mmHg. Moderate diffuse AV thickening with moderate AR. The  right ventricular systolic pressure was increased consistent with  moderate pulmonary hypertension.   He was started on Lasix and potassium at his last office visit. His breathing is better but he still has significant fatigue  Has pain in the left side of his neck and intrascapular pain with walking  No CP .   Some shortness of breath - not much    Past Medical History  Diagnosis Date  . History of prostate cancer 2001  . Hypertension   . Colon polyps   . Cataract   . Phimosis   . Carotid artery stenosis 11/2008    bilateral 40-59% stenosis  . Hepatitis     doesn't know which type 1969  . Hyperlipidemia   . Numbness     fingertips  . 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 Diverticulosis     Past Surgical History  Procedure Laterality Date  . Inguinal hernia repair    .  Varicose vein surgery    . Other surgical history      s/p laparoscopic prostate surgery  . Skin cancer excision      a/p skin cancer right foot-non melanoma  . Bladder suspension  08/18/10    [name]  . Eye surgery  09/24/10    left eye. "Retina surgery"  . Transurethral resection of bladder tumor  10/01/2011    Procedure: TRANSURETHRAL RESECTION OF BLADDER TUMOR (TURBT);  Surgeon: [name], MD;  Location: [facility];  Service: Urology;  Laterality: N/A;  . Biopsy  10/01/2011    Procedure: BIOPSY;  Surgeon: [name], MD;  Location: [facility];  Service: Urology;;  biopsy of bladder tumor  . Ureteroscopy  01/27/2012    Procedure: URETEROSCOPY;  Surgeon: [name], MD;  Location: [facility];  Service: Urology;  Laterality: Left;  CYSTO, Left RETROGRADE PYELOGRPHY, LEFT URETEROSCOPY   . Cystoscopy w/ retrogrades  01/27/2012    Procedure: CYSTOSCOPY WITH RETROGRADE PYELOGRAM;  Surgeon: [name], MD;  Location: [facility];  Service: Urology;  Laterality: Left;  . Hernia repair      x2  . Urinary sphincter implant    . Cystoscopy with retrograde pyelogram, ureteroscopy and stent placement Left 11/24/2012    Procedure: CYSTOSCOPY WITH left RETROGRADE PYELOGRAM, bladder washings, ureteroscopy;  Surgeon: [name], MD;  Location: [facility];  Service: Urology;  Laterality: Left;  . Transurethral resection of bladder tumor N/A 11/19/2013    Procedure: TRANSURETHRAL RESECTION OF BLADDER TUMOR (TURBT);  Surgeon: [name], MD;  Location: [facility];  Service: Urology;  Laterality: N/A;  . Cystoscopy with retrograde pyelogram, ureteroscopy and stent placement Bilateral 11/19/2013    Procedure: [place],;  Surgeon: [name], MD;  Location: [facility];  Service: Urology;  Laterality: Bilateral;     Current Outpatient Prescriptions  Medication Sig Dispense Refill  . apixaban (ELIQUIS) 2.5 MG TABS tablet Take 1 tablet (2.5 mg total) by mouth 2 (two) times daily. 60 tablet 11    . B Complex-C (B-COMPLEX WITH VITAMIN C) tablet Take 1 tablet by mouth daily.    . fluocinonide cream (LIDEX) AB-123456789 % Apply 1 application topically 2 (two) times daily as needed (skin irritation).     . furosemide (LASIX) 40 MG tablet Take 1 tablet (40 mg total) by mouth daily. 30 tablet 11  . methylcellulose (ARTIFICIAL TEARS) 1 % ophthalmic solution Place 1 drop into both eyes 2 (two) times daily.     . metoprolol succinate (TOPROL-XL) 25 MG 24 hr tablet Take 1 tablet (25 mg total) by mouth at bedtime. 90 tablet 0  . Multiple Vitamins-Minerals (PRESERVISION/LUTEIN PO) Take 1 tablet by mouth daily.    . potassium chloride (K-DUR) 10 MEQ tablet Take 1 tablet (10 mEq total) by mouth daily. 30 tablet 11  . pravastatin (PRAVACHOL) 20 MG tablet Take 1 tablet (20 mg total) by mouth at bedtime. 180 tablet 2   No current facility-administered medications for this visit.    Allergies:   Review of patient's allergies indicates no known allergies.    Social History:  The patient  reports that he has been passively smoking Cigars.  He has never used smokeless tobacco. He reports that he drinks alcohol. He reports that he does not use illicit drugs.   Family History:  The patient's family history is negative for Colon cancer and Colon polyps.    ROS:  Please see the history of present illness.    Review of Systems: Constitutional:  denies fever, chills, diaphoresis, appetite change and fatigue.  HEENT: denies photophobia, eye pain, redness, hearing loss, ear pain, congestion, sore throat, rhinorrhea, sneezing, neck pain, neck stiffness and tinnitus.  Respiratory: denies SOB, DOE, cough, chest tightness, and wheezing.  Cardiovascular: denies chest pain, palpitations and leg swelling.  Gastrointestinal: denies nausea, vomiting, abdominal pain, diarrhea, constipation, blood in stool.  Genitourinary: denies dysuria, urgency, frequency, hematuria, flank pain and difficulty urinating.  Musculoskeletal:  denies  myalgias, back pain, joint swelling, arthralgias and gait problem.   Skin: denies pallor, rash and wound.  Neurological: denies dizziness, seizures, syncope, weakness, light-headedness, numbness and headaches.   Hematological: denies adenopathy, easy bruising, personal or family bleeding history.  Psychiatric/ Behavioral: denies suicidal ideation, mood changes, confusion, nervousness, sleep disturbance and agitation.       All other systems are reviewed and negative.    PHYSICAL EXAM: VS:  BP 128/68 mmHg  Pulse 72  Ht 5\' 10"  (1.778 m)  Wt 125 lb 12.8 oz (57.063 kg)  BMI 18.05 kg/m2  SpO2 98% , BMI Body mass index is 18.05 kg/(m^2). GEN: Well nourished, well developed, in no acute distress HEENT: normal Neck: no JVD, carotid bruits, or masses Cardiac: irreg Irreg. ;  Soft systolic murmur,  99991111 diastolic murmur c/w AI . no rubs, or  gallops,no edema  Respiratory:  clear to auscultation bilaterally, normal work of breathing GI: soft, nontender, nondistended, + BS MS: no deformity or atrophy Skin: warm and dry, no rash Neuro:  Strength and sensation are intact Psych: normal   EKG:  EKG is not ordered today. The ekg ordered 07/15/15  demonstrates Atrial fib with HR of 61.      Recent Labs: 07/15/2015: BUN 34*; Creatinine, Ser 0.97; Hemoglobin 14.3; Platelets 312.0; Potassium 5.3*; Sodium 140; TSH 0.97    Lipid Panel    Component Value Date/Time   CHOL 135 09/14/2013 0852   TRIG 62.0 09/14/2013 0852   HDL 47.00 09/14/2013 0852   CHOLHDL 3 09/14/2013 0852   VLDL 12.4 09/14/2013 0852   LDLCALC 76 09/14/2013 0852      Wt Readings from Last 3 Encounters:  07/24/15 125 lb 12.8 oz (57.063 kg)  07/16/15 126 lb 12.8 oz (57.516 kg)  07/15/15 126 lb 11.2 oz (57.471 kg)      Other studies Reviewed: Additional studies/ records that were reviewed today include: . Review of the above records demonstrates:   Preliminary Echo results - LV function is low normal -   45-50% Moderate - severe AI Mild - mod MR Moderate TR with moderate pulmonary HTN Markedly enlarged right atrium. Moderately enlarged left atrium. At least moderate pulmonic valve insufficiency   ASSESSMENT AND PLAN:  1.  Atrial fib - his HR is very well controlled.   This may be contributing to his recent worsening symptoms but I suspect that the DOE is more due to his AI.[name] He has significant right and left atrial enlargement. I doubt that he would hold normal sinus rhythm. If he does it up needing surgery, we can consider a Maze procedure.  2. Aortic insufficiency -  he has at least moderate aortic insufficiency. I would like to get a transesophageal echo to see if perhaps it is more severe than that.   He also has mitral regurgitation and tricuspid regurgitation. We'll also be assessing him with a heart cath.   3. Pulmonary HTN:   At least moderate . Likely due to his valvular disease and low / normal LV function  Currently on  Lasix 40 a day and Kdur 10 meq a day  - seems to be helping some .  Will see him in 1 week for office and BMP   4. Mitral regurgitation - ? Mild - moderate   We'll schedule him for a heart catheterization including a right left heart catheterization with an aortogram. We discussed the risks, benefits, and options of heart catheterization and transesophageal echo. He understands and agrees to proceed. We'll schedule these for the same day next week.  Current medicines are reviewed at length with the patient today.  The patient does not have concerns regarding medicines.  The following changes have been made:  no change  Labs/ tests ordered today include:  No orders of the defined types were placed in this encounter.     Disposition:   FU with me in 1 month    Nahser, [name], MD  07/24/2015 8:13 AM    [facility] [address], [place], [place]  [address] Phone: [phone]; Fax: [phone]

## 2015-07-29 NOTE — Progress Notes (Signed)
  Echocardiogram Echocardiogram Transesophageal has been performed.  Jeremy Kelley 07/29/2015, 9:09 AM

## 2015-07-29 NOTE — Progress Notes (Signed)
Pt complaining about blood pressure cuff being on his arm and took off cuff.  Pt refusing to keep it on for frequent VS post cath.  Pt took off condom cath after requesting one to be put on.  Pt is not complaint with orders given.  Will continue to monitor.

## 2015-07-29 NOTE — Progress Notes (Signed)
Site area: rt groin Site Prior to Removal:  Level 0 Pressure Applied For: 20 minutes Manual:   yes Patient Status During Pull:  stable Post Pull Site:  Level 0 Post Pull Instructions Given:  yes Post Pull Pulses Present: yes Dressing Applied:  tegaderm Bedrest begins @  1100 Comments:   

## 2015-07-30 ENCOUNTER — Encounter (HOSPITAL_COMMUNITY): Payer: Self-pay | Admitting: Internal Medicine

## 2015-07-30 DIAGNOSIS — Z7901 Long term (current) use of anticoagulants: Secondary | ICD-10-CM | POA: Diagnosis not present

## 2015-07-30 DIAGNOSIS — E785 Hyperlipidemia, unspecified: Secondary | ICD-10-CM | POA: Diagnosis not present

## 2015-07-30 DIAGNOSIS — I10 Essential (primary) hypertension: Secondary | ICD-10-CM

## 2015-07-30 DIAGNOSIS — I482 Chronic atrial fibrillation: Secondary | ICD-10-CM | POA: Diagnosis not present

## 2015-07-30 DIAGNOSIS — Z8550 Personal history of malignant neoplasm of unspecified urinary tract organ: Secondary | ICD-10-CM | POA: Diagnosis not present

## 2015-07-30 DIAGNOSIS — I081 Rheumatic disorders of both mitral and tricuspid valves: Secondary | ICD-10-CM | POA: Diagnosis not present

## 2015-07-30 DIAGNOSIS — I272 Other secondary pulmonary hypertension: Secondary | ICD-10-CM | POA: Diagnosis not present

## 2015-07-30 DIAGNOSIS — Z8546 Personal history of malignant neoplasm of prostate: Secondary | ICD-10-CM | POA: Diagnosis not present

## 2015-07-30 DIAGNOSIS — I351 Nonrheumatic aortic (valve) insufficiency: Secondary | ICD-10-CM | POA: Diagnosis not present

## 2015-07-30 NOTE — Progress Notes (Signed)
07/30/15  1245  Reviewed discharge instructions with patient. Patient verbalized understanding of discharge instructions. Copy of discharge instructions given to patient.

## 2015-07-30 NOTE — Discharge Summary (Signed)
Discharge Summary    Patient ID: Jeremy MESEROLE,  MRN: [id number], DOB/AGE: 80-Jan-1928 80 y.o.  Admit date: 07/29/2015 Discharge date: 07/30/2015  Primary Care Provider: [name] Primary Cardiologist: Dr. Acie Fredrickson  Discharge Diagnoses    Principal Problem:   Aortic insufficiency Active Problems:   Essential hypertension   Atrial fibrillation (HCC)   Pulmonary HTN (HCC)   Allergies No Known Allergies  Diagnostic Studies/Procedures  Transesophageal Echocardiography 07/29/15 - Left ventricle: There was mild concentric hypertrophy. Systolic  function was normal. The estimated ejection fraction was in the  range of 50% to 55%. - Aortic valve: Trileaflet. sclerotic tips. There is moderate  central AI. - Aorta: Non-dilated. Mild atheromatous disease. - Mitral valve: There was mild regurgitation. - Left atrium: Severely dilated. No evidence of thrombus in the  atrial cavity or appendage. Large chickenwing appendage- measures  36 mm x 48 mm in the 45 degree view. - Right atrium: Severely dilated. - Atrial septum: No defect or patent foramen ovale was identified. - Pulmonic valve: Moderate regurgitation. - Pulmonary arteries: Dilated.  Impressions:  - At most moderate AI with normal LV chamber dimensions. _____________ Right/Left Heart Cath and Coronary Angiography 07/29/15  Ost LAD to Mid LAD lesion, 15% stenosed.  Prox Cx to Mid Cx lesion, 10% stenosed.  The left ventricular systolic function is normal.  1. No significant CAD 2. Normal LV function 3. Moderate aortic insufficiency 4. Normal right heart and LV filling pressures.  Plan: medical management. History of Present Illness   Jeremy Kelley is a 82 year old male with a past medical history of hypertension, atrial fibrillation(on Eliquis), hyperlipidemia, aortic insufficiency, and prostate cancer. He is followed by Dr. Acie Fredrickson outpatient and was complaining of increasing shortness of breath with  exertion. He was admitted for TEE to evaluate severity of aortic insufficiency and left and right heart catheterization. He is very active. 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  Hospital Course  On 07/29/2015, he had TEE(reported above), EF was 50-55%.  He has moderate AI with normal LV chamber dimensions. He also underwent left and right heart catheterization (reported above). There was no significant CAD, normal LV function, moderate aortic insufficiency, and normal LV filling pressures.    Ischemic cause for shortness of breath on exertion was ruled out. Evaluation for aortic insufficiency shows moderate AI. Question whether decreasing intensity of diuretic and beta blocker therapy may help and improve his overall endurance. Complicated situation as he needs beta blocker therapy for rate control for atrial fibrillation.    His right groin site is stable. He was seen by Dr. Tamala Julian and deemed suitable for discharge. He will follow-up with Dr. Acie Fredrickson in 2 days.   Discharge Vitals Blood pressure 109/49, pulse 66, temperature 97.7 F (36.5 C), temperature source Oral, resp. rate 20, height 5\' 10"  (1.778 m), weight 119 lb 12.8 oz (54.341 kg), SpO2 97 %.  Filed Weights   07/29/15 1143 07/30/15 0500  Weight: 122 lb 9.2 oz (55.6 kg) 119 lb 12.8 oz (54.341 kg)    Labs & Radiologic Studies       Disposition   Pt is being discharged home today in good condition.  Follow-up Plans & Appointments    Follow-up Information    Follow up with Nahser, [name], MD On 08/01/2015.   Specialty:  Cardiology   Why:  8:15am    Contact information:   [address]  [place] [address] [phone]      Discharge Instructions    Diet - low sodium heart healthy    Complete by:  As directed      Increase activity slowly    Complete by:  As directed            Discharge Medications   Current Discharge Medication List    CONTINUE these  medications which have NOT CHANGED   Details  apixaban (ELIQUIS) 2.5 MG TABS tablet Take 1 tablet (2.5 mg total) by mouth 2 (two) times daily. Qty: 60 tablet, Refills: 11   Associated Diagnoses: Atrial fibrillation, unspecified type (HCC)    B Complex-C (B-COMPLEX WITH VITAMIN C) tablet Take 1 tablet by mouth daily. Reported on 07/24/2015    fluocinonide cream (LIDEX) [id number] % Apply 1 application topically 2 (two) times daily as needed (skin irritation).     furosemide (LASIX) 40 MG tablet Take 1 tablet (40 mg total) by mouth daily. Qty: 30 tablet, Refills: 11    methylcellulose (ARTIFICIAL TEARS) 1 % ophthalmic solution Place 1 drop into both eyes 2 (two) times daily.     metoprolol succinate (TOPROL-XL) 25 MG 24 hr tablet Take 1 tablet (25 mg total) by mouth at bedtime. Qty: 90 tablet, Refills: 0   Associated Diagnoses: Medication refill; Atrial fibrillation, unspecified type (HCC)    Multiple Vitamins-Minerals (PRESERVISION/LUTEIN PO) Take 1 tablet by mouth daily.    potassium chloride (K-DUR) 10 MEQ tablet Take 1 tablet (10 mEq total) by mouth daily. Qty: 30 tablet, Refills: 11    pravastatin (PRAVACHOL) 20 MG tablet Take 1 tablet (20 mg total) by mouth at bedtime. Qty: 180 tablet, Refills: 2   Associated Diagnoses: Medication refill      STOP taking these medications     aspirin 325 MG EC tablet           Outstanding Labs/Studies    Duration of Discharge Encounter   Greater than 30 minutes including physician time.  Signed, Arbutus Leas NP 07/30/2015, 12:51 PM The patient has been seen in conjunction with Arbutus Leas. All aspects of care have been considered and discussed. The patient has been personally interviewed, examined, and all clinical data has been reviewed.   Please see my note from earlier today.  Catheterization and transesophageal echo do not demonstrate aortic valve abnormality significant enough to require surgery.  With low normal EDP,  considered decreasing intensity of medical therapy. Final decision concerning management per Dr. Acie Fredrickson.

## 2015-07-30 NOTE — Progress Notes (Signed)
Pt refused bed alarm on, verbalized that he's very independent and lives alone, educated on patient safety plan but pt still refused bed alarm. Will continue to do hourly rounding.

## 2015-07-30 NOTE — Progress Notes (Signed)
Patient Name: Jeremy Kelley Date of Encounter: 07/30/2015  Principal Problem:   Aortic insufficiency Active Problems:   Essential hypertension   Atrial fibrillation (HCC)   Pulmonary HTN ([place])   Primary Cardiologist: Dr. Acie Fredrickson Patient Profile: 82 year old male with a past medical history of chronic Afib (on Eliquis), HLD, HTN, aortic insufficieny and prostate CA.  He was scheduled for planned cath after increasing DOE.    SUBJECTIVE: Feels well this morning. Wants to go home. Denies SOB, orthopnea.   OBJECTIVE Filed Vitals:   07/29/15 1115 07/29/15 1143 07/29/15 2008 07/30/15 0500  BP: 140/62 150/71 138/55 109/49  Pulse: 60 61 121 66  Temp:  97.7 F (36.5 C) 97.4 F (36.3 C) 97.7 F (36.5 C)  TempSrc:  Oral Oral Oral  Resp: 28 16 20 20   Height:  5\' 10"  (1.778 m)    Weight:  122 lb 9.2 oz (55.6 kg)  119 lb 12.8 oz (54.341 kg)  SpO2: 100% 100% 97% 97%    Intake/Output Summary (Last 24 hours) at 07/30/15 1001 Last data filed at 07/30/15 0900  Gross per 24 hour  Intake    360 ml  Output    400 ml  Net    -40 ml   Filed Weights   07/29/15 1143 07/30/15 0500  Weight: 122 lb 9.2 oz (55.6 kg) 119 lb 12.8 oz (54.341 kg)    PHYSICAL EXAM General: Well developed, well nourished, male in no acute distress. Head: Normocephalic, atraumatic.  Neck: Supple without bruits, No JVD. Lungs:  Resp regular and unlabored, CTA Heart: IRRR, S1, S2, 3/6 systolic murmur; no rub. Abdomen: Soft, non-tender, non-distended, BS + x 4.  Extremities: No clubbing, BLE +2 pulses, evidence of venous insufficieny, No edema.  Neuro: Alert and oriented X 3. Moves all extremities spontaneously. Psych: Normal affect.   Current facility-administered medications:  .  0.9 %  sodium chloride infusion, 250 mL, Intravenous, PRN, Peter M Martinique, MD .  acetaminophen (TYLENOL) tablet 650 mg, 650 mg, Oral, Q4H PRN, Peter M Martinique, MD .  furosemide (LASIX) tablet 40 mg, 40 mg, Oral, Daily, Peter M  Martinique, MD .  metoprolol succinate (TOPROL-XL) 24 hr tablet 25 mg, 25 mg, Oral, QHS, Peter M Martinique, MD .  ondansetron [facility]) injection 4 mg, 4 mg, Intravenous, Q6H PRN, Peter M Martinique, MD .  polyvinyl alcohol (LIQUIFILM TEARS) 1.4 % ophthalmic solution 1 drop, 1 drop, Both Eyes, BID, Peter M Martinique, MD, 1 drop at 07/29/15 2154 .  potassium chloride (K-DUR) CR tablet 10 mEq, 10 mEq, Oral, Daily, Peter M Martinique, MD .  pravastatin (PRAVACHOL) tablet 20 mg, 20 mg, Oral, QHS, Peter M Martinique, MD, 20 mg at 07/29/15 2153 .  sodium chloride flush (NS) 0.9 % injection 3 mL, 3 mL, Intravenous, Q12H, Peter M Martinique, MD, 3 mL at 07/29/15 2156 .  sodium chloride flush (NS) 0.9 % injection 3 mL, 3 mL, Intravenous, PRN, Peter M Martinique, MD     ECG: Afib  Radiology/Studies:  Echo 07/16/15:  - Left ventricle: The cavity size was normal. There was mild focal  basal hypertrophy of the septum. Systolic function was normal.  The estimated ejection fraction was in the range of 50% to 55%.  The study was not technically sufficient to allow evaluation of  LV diastolic dysfunction due to atrial fibrillation. - Aortic valve: Moderate diffuse thickening and calcification,  consistent with sclerosis. There was moderate regurgitation. - Mitral valve: Calcified annulus. Moderate diffuse  thickening of  the anterior leaflet and posterior leaflet. There was mild  regurgitation. Valve area by continuity equation (using LVOT  flow): 2.27 cm^2. - Left atrium: The atrium was severely dilated. - Right ventricle: The cavity size was moderately dilated. Systolic  function was moderately reduced. - Right atrium: The atrium was severely dilated. - Tricuspid valve: There was moderate regurgitation. - Pulmonic valve: There was moderate regurgitation. - Pulmonary arteries: PA peak pressure: 57 mm Hg (S).  Impressions:  - Low normal LVF with EF 50-55%. Atrial fibrillation present.  Severe biatrial enlargement.  Moderately dilated LV and moderately  reduced RVF. Moderate TR and PR and moderate pulmonary HTN with  PASP 31mmHg. Moderate diffuse AV thickening with moderate AR. The  right ventricular systolic pressure was increased consistent with  moderate pulmonary hypertension.  Right/Left Heart Cath and Coronary Angiography 07/29/15     Ost LAD to Mid LAD lesion, 15% stenosed.  Prox Cx to Mid Cx lesion, 10% stenosed.  The left ventricular systolic function is normal.  1. No significant CAD 2. Normal LV function 3. Moderate aortic insufficiency 4. Normal right heart and LV filling pressures.  Plan: medical management.    Current Medications:  . furosemide  40 mg Oral Daily  . metoprolol succinate  25 mg Oral QHS  . polyvinyl alcohol  1 drop Both Eyes BID  . potassium chloride  10 mEq Oral Daily  . pravastatin  20 mg Oral QHS  . sodium chloride flush  3 mL Intravenous Q12H      ASSESSMENT AND PLAN: Principal Problem:   Aortic insufficiency Active Problems:   Essential hypertension   Atrial fibrillation (HCC)   Pulmonary HTN ([place])  1. Dyspnea/weakness: Patient had left and right heart cath yesterday.  No CAD, normal right heart pressures.  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  Beginning 2 months ago he notes that he cannot kayak without SOB.  Can not climb more than 2 flights of stairs without becoming SOB.  His Afib is rate controlled.  He does have moderate AS, this could be contributing.   2. Atrial fibrillation: Rate controlled on Toprol-XL.  Ok to restart Eliquis today.   This patients CHA2DS2-VASc Score and unadjusted Ischemic Stroke Rate (% per year) is equal to 3.2 % stroke rate/year from a score of 3 Above score calculated as 1 point each if present [CHF, HTN, DM, Vascular=MI/PAD/Aortic Plaque, Age if 65-74, or Male], 2 points each if present [Age > 75, or Stroke/TIA/TE]  3. Aortic insufficiency: Echo report above. Moderate AI.   TEE preformed this month, normal LV chamber dimensions.  Patient is on daily diuretic. MD to advise on plan.      Signed, Arbutus Leas , NP 10:01 AM 07/30/2015 Pager [phone]  The patient has been seen in conjunction with Jettie Booze, NP. All aspects of care have been considered and discussed. The patient has been personally interviewed, examined, and all clinical data has been reviewed.   The patient is ready for discharge. He right groin cath site is unremarkable.  Both the cardiac catheterization and echocardiogram demonstrated moderate mitral regurgitation without significant hemodynamic burden on the heart.  i would consider decreasing intensity of diuretic and beta blocker therapy. This may improve his overall endurance. I will leave final decision concerning medication regimen to Dr. Acie Fredrickson.

## 2015-07-31 ENCOUNTER — Telehealth: Payer: Self-pay | Admitting: Adult Health

## 2015-07-31 NOTE — Telephone Encounter (Signed)
Opened in error

## 2015-08-01 ENCOUNTER — Encounter: Payer: Self-pay | Admitting: Adult Health

## 2015-08-01 ENCOUNTER — Encounter: Payer: Self-pay | Admitting: Cardiovascular Disease

## 2015-08-01 ENCOUNTER — Ambulatory Visit (INDEPENDENT_AMBULATORY_CARE_PROVIDER_SITE_OTHER): Payer: Medicare Other | Admitting: Cardiovascular Disease

## 2015-08-01 ENCOUNTER — Ambulatory Visit (INDEPENDENT_AMBULATORY_CARE_PROVIDER_SITE_OTHER): Payer: Medicare Other | Admitting: Adult Health

## 2015-08-01 VITALS — BP 124/70 | HR 72 | Ht 70.0 in | Wt 124.4 lb

## 2015-08-01 VITALS — BP 132/60 | Temp 98.2°F | Wt 124.4 lb

## 2015-08-01 DIAGNOSIS — I272 Other secondary pulmonary hypertension: Secondary | ICD-10-CM

## 2015-08-01 DIAGNOSIS — I4891 Unspecified atrial fibrillation: Secondary | ICD-10-CM | POA: Diagnosis not present

## 2015-08-01 DIAGNOSIS — I2510 Atherosclerotic heart disease of native coronary artery without angina pectoris: Secondary | ICD-10-CM | POA: Insufficient documentation

## 2015-08-01 DIAGNOSIS — I25119 Atherosclerotic heart disease of native coronary artery with unspecified angina pectoris: Secondary | ICD-10-CM | POA: Diagnosis not present

## 2015-08-01 DIAGNOSIS — T148XXA Other injury of unspecified body region, initial encounter: Secondary | ICD-10-CM

## 2015-08-01 DIAGNOSIS — I482 Chronic atrial fibrillation, unspecified: Secondary | ICD-10-CM

## 2015-08-01 DIAGNOSIS — I359 Nonrheumatic aortic valve disorder, unspecified: Secondary | ICD-10-CM | POA: Diagnosis not present

## 2015-08-01 DIAGNOSIS — S161XXA Strain of muscle, fascia and tendon at neck level, initial encounter: Secondary | ICD-10-CM | POA: Diagnosis not present

## 2015-08-01 DIAGNOSIS — I351 Nonrheumatic aortic (valve) insufficiency: Secondary | ICD-10-CM | POA: Diagnosis not present

## 2015-08-01 HISTORY — DX: Atherosclerotic heart disease of native coronary artery without angina pectoris: I25.10

## 2015-08-01 LAB — BASIC METABOLIC PANEL
BUN: 22 mg/dL (ref 7–25)
CALCIUM: 9.5 mg/dL (ref 8.6–10.3)
CO2: 31 mmol/L (ref 20–31)
CREATININE: 1.05 mg/dL (ref 0.70–1.11)
Chloride: 97 mmol/L — ABNORMAL LOW (ref 98–110)
GLUCOSE: 93 mg/dL (ref 65–99)
Potassium: 3.9 mmol/L (ref 3.5–5.3)
SODIUM: 131 mmol/L — AB (ref 135–146)

## 2015-08-01 MED ORDER — METHYLPREDNISOLONE 4 MG PO TBPK: ORAL_TABLET | ORAL | Status: DC

## 2015-08-01 NOTE — Progress Notes (Signed)
Cardiology Office Note   Date:  08/01/2015   ID:  Jeremy Kelley, DOB 08-21-1926, MRN [id number]  PCP:  [name], NP  Cardiologist:   Thayer Headings, MD   Chief Complaint  Patient presents with  . Shortness of Breath   Problem List 1. Atrial Fib  2. Hyperlipidemia 3. [place] 4. Carotid artery disease - 40-50% bilaterally    History of Present Illness: July 16, 2015: Jeremy Kelley is a 82 y.o. male who presents for further evaluation of atrial fib. He has noticed some fatigue and DOE for the past several months . Has DOE with very mild exertion  - ie walking [place] at the airport.   Denies any chest pain Retired from [employer].    Spends lots of time in [place]. [place]. Spends winters in [place] , [place] in [place] .   July 24, 2015:  Jeremy Kelley is seen back today for follow up visit . He presented with progressive DOE.  Echo showed:  Low normal LVF with EF 50-55%. Atrial fibrillation present.  Severe biatrial enlargement. Moderately dilated LV and moderately  reduced RVF. Moderate TR and PR and moderate pulmonary HTN with  PASP 40mmHg. Moderate diffuse AV thickening with moderate AR. The  right ventricular systolic pressure was increased consistent with  moderate pulmonary hypertension.   He was started on Lasix and potassium at his last office visit. His breathing is better but he still has significant fatigue  Has pain in the left side of his neck and intrascapular pain with walking  No CP .   Some shortness of breath - not much   August 01, 2015: Jeremy Kelley is seen today  TEE showed moderate AI, mild MR, moderate PI,  Markedly dilated RA  - Left ventricle: There was mild concentric hypertrophy. Systolic  function was normal. The estimated ejection fraction was in the  range of 50% to 55%. - Aortic valve: Trileaflet. sclerotic tips. There is moderate  central AI. - Aorta: Non-dilated. Mild atheromatous disease. - Mitral valve: There  was mild regurgitation. - Left atrium: Severely dilated. No evidence of thrombus in the  atrial cavity or appendage. Large chickenwing appendage- measures  36 mm x 48 mm in the 45 degree view. - Right atrium: Severely dilated. - Atrial septum: No defect or patent foramen ovale was identified. - Pulmonic valve: Moderate regurgitation. - Pulmonary arteries: Dilated.  Cath showed minimal CAD , mod AI,    He has persistent fatigue,  No dyspnea    Past Medical History  Diagnosis Date  . Hypertension   . Colon polyps   . Phimosis   . Carotid artery stenosis 11/2008    bilateral 40-59% stenosis  . Hepatitis     doesn't know which type 1969  . Hyperlipidemia   . Numbness     fingertips  . Urothelial carcinoma of left distal ureter ([facility])   . Diverticulosis   . Prostate cancer ([place]) 2001    Past Surgical History  Procedure Laterality Date  . Varicose vein surgery    . Prostate surgery      s/p laparoscopic prostate surgery  . Skin cancer excision      a/p skin cancer right foot-non melanoma  . Bladder suspension  08/18/10    [name]  . Eye surgery  09/24/10    left eye. "Retina surgery"  . Transurethral resection of bladder tumor  10/01/2011    Procedure: TRANSURETHRAL RESECTION OF BLADDER TUMOR (TURBT);  Surgeon:  [name], MD;  Location: [facility];  Service: Urology;  Laterality: N/A;  . Biopsy  10/01/2011    Procedure: BIOPSY;  Surgeon: [name], MD;  Location: [facility];  Service: Urology;;  biopsy of bladder tumor  . Ureteroscopy  01/27/2012    Procedure: URETEROSCOPY;  Surgeon: [name], MD;  Location: [facility];  Service: Urology;  Laterality: Left;  CYSTO, Left RETROGRADE PYELOGRPHY, LEFT URETEROSCOPY   . Cystoscopy w/ retrogrades  01/27/2012    Procedure: CYSTOSCOPY WITH RETROGRADE PYELOGRAM;  Surgeon: [name], MD;  Location: [facility];  Service: Urology;  Laterality: Left;  . Inguinal hernia repair  1960  . Urinary sphincter implant    . Cystoscopy with retrograde  pyelogram, ureteroscopy and stent placement Left 11/24/2012    Procedure: CYSTOSCOPY WITH left RETROGRADE PYELOGRAM, bladder washings, ureteroscopy;  Surgeon: [name], MD;  Location: [facility];  Service: Urology;  Laterality: Left;  . Transurethral resection of bladder tumor N/A 11/19/2013    Procedure: TRANSURETHRAL RESECTION OF BLADDER TUMOR (TURBT);  Surgeon: [name], MD;  Location: [facility];  Service: Urology;  Laterality: N/A;  . Cystoscopy with retrograde pyelogram, ureteroscopy and stent placement Bilateral 11/19/2013    Procedure: [place],;  Surgeon: [name], MD;  Location: [facility];  Service: Urology;  Laterality: Bilateral;  . Cataract extraction w/ intraocular lens  implant, bilateral Bilateral   . Cardiac catheterization N/A 07/29/2015    Procedure: Right/Left Heart Cath and Coronary Angiography;  Surgeon: Peter M Martinique, MD;  Location: [facility];  Service: Cardiovascular;  Laterality: N/A;  . Tee without cardioversion N/A 07/29/2015    Procedure: TRANSESOPHAGEAL ECHOCARDIOGRAM (TEE);  Surgeon: Pixie Casino, MD;  Location[facility];  Service: Cardiovascular;  Laterality: N/A;  cath to follow     Current Outpatient Prescriptions  Medication Sig Dispense Refill  . apixaban (ELIQUIS) 2.5 MG TABS tablet Take 1 tablet (2.5 mg total) by mouth 2 (two) times daily. 60 tablet 11  . B Complex-C (B-COMPLEX WITH VITAMIN C) tablet Take 1 tablet by mouth daily. Reported on 07/24/2015    . fluocinonide cream (LIDEX) [id number] % Apply 1 application topically 2 (two) times daily as needed (skin irritation).     . furosemide (LASIX) 40 MG tablet Take 1 tablet (40 mg total) by mouth daily. 30 tablet 11  . methylcellulose (ARTIFICIAL TEARS) 1 % ophthalmic solution Place 1 drop into both eyes 2 (two) times daily.     . metoprolol succinate (TOPROL-XL) 25 MG 24 hr tablet Take 1 tablet (25 mg total) by mouth at bedtime. 90 tablet 0  . Multiple  Vitamins-Minerals (PRESERVISION/LUTEIN PO) Take 1 tablet by mouth daily.    . potassium chloride (K-DUR) 10 MEQ tablet Take 1 tablet (10 mEq total) by mouth daily. 30 tablet 11  . pravastatin (PRAVACHOL) 20 MG tablet Take 1 tablet (20 mg total) by mouth at bedtime. 180 tablet 2   No current facility-administered medications for this visit.    Allergies:   Review of patient's allergies indicates no known allergies.    Social History:  The patient  reports that he has been passively smoking Cigars.  He has never used smokeless tobacco. He reports that he drinks about 8.4 oz of alcohol per week. He reports that he does not use illicit drugs.   Family History:  The patient's family history is negative for Colon cancer and Colon polyps.    ROS:  Please see the history of present illness.  Review of Systems: Constitutional:  denies fever, chills, diaphoresis, appetite change and fatigue.  HEENT: denies photophobia, eye pain, redness, hearing loss, ear pain, congestion, sore throat, rhinorrhea, sneezing, neck pain, neck stiffness and tinnitus.  Respiratory: denies SOB, DOE, cough, chest tightness, and wheezing.  Cardiovascular: denies chest pain, palpitations and leg swelling.  Gastrointestinal: denies nausea, vomiting, abdominal pain, diarrhea, constipation, blood in stool.  Genitourinary: denies dysuria, urgency, frequency, hematuria, flank pain and difficulty urinating.  Musculoskeletal: denies  myalgias, back pain, joint swelling, arthralgias and gait problem.   Skin: denies pallor, rash and wound.  Neurological: denies dizziness, seizures, syncope, weakness, light-headedness, numbness and headaches.   Hematological: denies adenopathy, easy bruising, personal or family bleeding history.  Psychiatric/ Behavioral: denies suicidal ideation, mood changes, confusion, nervousness, sleep disturbance and agitation.       All other systems are reviewed and negative.    PHYSICAL EXAM: VS:   BP 124/70 mmHg  Pulse 72  Ht 5\' 10"  (1.778 m)  Wt 124 lb 6.4 oz (56.427 kg)  BMI 17.85 kg/m2 , BMI Body mass index is 17.85 kg/(m^2). GEN: Well nourished, well developed, in no acute distress HEENT: normal Neck: no JVD, carotid bruits, or masses Cardiac: irreg Irreg. ;  Soft systolic murmur,  99991111 diastolic murmur c/w AI . no rubs, or gallops,no edema  Respiratory:  clear to auscultation bilaterally, normal work of breathing GI: soft, nontender, nondistended, + BS MS: no deformity or atrophy, cath site looks great  Skin: warm and dry, no rash Neuro:  Strength and sensation are intact Psych: normal   EKG:  EKG is not ordered today. The ekg ordered 07/15/15  demonstrates Atrial fib with HR of 61.      Recent Labs: 07/15/2015: TSH 0.97 07/24/2015: BUN 31*; Creat 1.08; Hemoglobin 15.1; Platelets 206; Potassium 4.4; Sodium 139    Lipid Panel    Component Value Date/Time   CHOL 135 09/14/2013 0852   TRIG 62.0 09/14/2013 0852   HDL 47.00 09/14/2013 0852   CHOLHDL 3 09/14/2013 0852   VLDL 12.4 09/14/2013 0852   LDLCALC 76 09/14/2013 0852      Wt Readings from Last 3 Encounters:  08/01/15 124 lb 6.4 oz (56.427 kg)  07/30/15 119 lb 12.8 oz (54.341 kg)  07/24/15 124 lb 6.4 oz (56.427 kg)      Other studies Reviewed: Additional studies/ records that were reviewed today include: . Review of the above records demonstrates:   Preliminary Echo results - LV function is low normal -  45-50% Moderate - severe AI Mild - mod MR Moderate TR with moderate pulmonary HTN Markedly enlarged right atrium. Moderately enlarged left atrium. At least moderate pulmonic valve insufficiency   ASSESSMENT AND PLAN:  1.  Atrial fib - his HR is very well controlled.   This may be contributing to his recent worsening symptoms of fatigue.   He has marked bi-atrial enlargement so I doubt cardioversion will be successful   2. Aortic insufficiency -  he has at least moderate aortic insufficiency.     3. Pulmonary HTN:   At least moderate . Likely due to his valvular disease and low / normal LV function   on  Lasix 40 a day and Kdur 10 meq a day  - seems to be helping some   4. Mitral regurgitation -   Mild - moderate  5. CAD - has only mild CAD by cath. Will check a BMP today ( post cath )   6. Generalized fatigue:  I suspect this is mostly due to his age with  valvular disease and atrial fib. At this point, there is no procedure that we can recommend that would give him more energy.  Continue with current meds.   Current medicines are reviewed at length with the patient today.  The patient does not have concerns regarding medicines.  The following changes have been made:  no change  Labs/ tests ordered today include:  No orders of the defined types were placed in this encounter.     Disposition:   FU with me in 6 months   Nahser, [name], MD  08/01/2015 8:11 AM    [facility] [place], [place], [place]  [address] Phone: [phone]; Fax: [phone]

## 2015-08-01 NOTE — Patient Instructions (Signed)
**Note De-identified  Obfuscation** Medication Instructions:  Same-no changes  Labwork: BMET today  Testing/Procedures: None  Follow-Up: Your physician wants you to follow-up in: 6 months. You will receive a reminder letter in the mail two months in advance. If you don't receive a letter, please call our office to schedule the follow-up appointment.     If you need a refill on your cardiac medications before your next appointment, please call your pharmacy.   

## 2015-08-01 NOTE — Progress Notes (Signed)
   Subjective:    Patient ID: Jeremy Kelley, male    DOB: Jul 02, 1926, 82 y.o.   MRN: [id number]  HPI  82 year old male who presents to the office today for the complaint of "pain in my neck". This pain has been present for the last two months, but has been increasing in intensity over the last few weeks. The pain is described as dull. Movements do not make it worse. When he lays down in bed or on the couch the cough pain resolves.   Denies any blurred vision, headaches, loss of ROM  He did fall a few months ago while in [place], causing broken ribs.    Review of Systems  Constitutional: Positive for fatigue.  Musculoskeletal: Positive for myalgias, arthralgias and neck pain. Negative for neck stiffness.  Neurological: Negative for dizziness and light-headedness.  All other systems reviewed and are negative.      Objective:   Physical Exam  Constitutional: He is oriented to person, place, and time. He appears well-developed and well-nourished. No distress.  Cardiovascular: Normal rate, regular rhythm and intact distal pulses.  Exam reveals no gallop and no friction rub.   Murmur heard. Pulmonary/Chest: Effort normal and breath sounds normal. No respiratory distress. He has no wheezes. He has no rales. He exhibits no tenderness.  Musculoskeletal: Normal range of motion. He exhibits tenderness (tenderness with palpation to base of trapezius on right side. ). He exhibits no edema.  Neurological: He is alert and oriented to person, place, and time.  Skin: Skin is warm and dry. No rash noted. He is not diaphoretic. No erythema. No pallor.  Psychiatric: He has a normal mood and affect. His behavior is normal. Judgment and thought content normal.  Nursing note and vitals reviewed.     Assessment & Plan:  1. Muscle strain - Likely due to poor posture. He does have a degree of kyphosis. Pain could partly be due to fall.  - methylPREDNISolone (MEDROL DOSEPAK) 4 MG TBPK tablet; Take as  directed  Dispense: 21 tablet; Refill: 0 - May try massage, Nsaids, or tylenol.  - Do not want to give muscle relaxers due to side effects and concern for falls since he is on Eliquis  [name], NP

## 2015-08-05 ENCOUNTER — Telehealth: Payer: Self-pay | Admitting: Cardiovascular Disease

## 2015-08-05 NOTE — Telephone Encounter (Signed)
Follow Up ° °Pt returned call//  °

## 2015-08-05 NOTE — Telephone Encounter (Signed)
Reviewed lab results with patient who verbalized understanding.  He states he has a hx of prostatectomy and bladder cancer and has noticed recently that he has urinary incontinence.  He asks if this is caused by any of the medications he is taking.  I advised that since he is taking furosemide, he likely has increased urinary output and this could possibly cause more incontinence.  I encouraged him to call his urologist.  He thanked me for the call.

## 2015-08-05 NOTE — Telephone Encounter (Signed)
-----   Message from Thayer Headings, MD sent at 08/04/2015 12:53 PM EDT ----- Sodium is a bit lower . Continue meds.

## 2015-08-06 DIAGNOSIS — C44729 Squamous cell carcinoma of skin of left lower limb, including hip: Secondary | ICD-10-CM | POA: Diagnosis not present

## 2015-08-07 ENCOUNTER — Other Ambulatory Visit: Payer: Self-pay | Admitting: *Deleted

## 2015-08-07 DIAGNOSIS — I872 Venous insufficiency (chronic) (peripheral): Secondary | ICD-10-CM

## 2015-08-07 DIAGNOSIS — R200 Anesthesia of skin: Secondary | ICD-10-CM

## 2015-08-07 DIAGNOSIS — I83893 Varicose veins of bilateral lower extremities with other complications: Secondary | ICD-10-CM

## 2015-08-07 DIAGNOSIS — R202 Paresthesia of skin: Principal | ICD-10-CM

## 2015-08-08 ENCOUNTER — Encounter: Payer: Self-pay | Admitting: Adult Health

## 2015-08-08 ENCOUNTER — Ambulatory Visit (INDEPENDENT_AMBULATORY_CARE_PROVIDER_SITE_OTHER): Payer: Medicare Other | Admitting: Adult Health

## 2015-08-08 VITALS — BP 118/62 | HR 76 | Temp 98.3°F | Wt 125.3 lb

## 2015-08-08 DIAGNOSIS — N3945 Continuous leakage: Secondary | ICD-10-CM | POA: Diagnosis not present

## 2015-08-08 DIAGNOSIS — R5382 Chronic fatigue, unspecified: Secondary | ICD-10-CM | POA: Diagnosis not present

## 2015-08-08 DIAGNOSIS — I25119 Atherosclerotic heart disease of native coronary artery with unspecified angina pectoris: Secondary | ICD-10-CM

## 2015-08-08 MED ORDER — MIRABEGRON ER 50 MG PO TB24: 50.0000 mg | ORAL_TABLET | Freq: Every day | ORAL | Status: DC

## 2015-08-08 NOTE — Progress Notes (Signed)
   Subjective:    Patient ID: Jeremy Kelley, male    DOB: [birth year], 82 y.o.   MRN: [id number]  HPI  82 year old male who presents to the office for a few different issues.   1) He has ongoing fatigue since January. This fatigue has not been getting any worse but it has not been getting any better either. He has started exercising again, light cardio exercises at home.   2) He has increasing incontinence issues. This has been going on for over a month but that the symptoms have been getting worse. He has seen urology and had cystoscopy done recently. He follows up with them in August. He is concerned due to the frequency and he is afraid to be out in public.   His MSK pain in the neck has improved with the medrol dose pack.  Review of Systems  Constitutional: Positive for fatigue.  Genitourinary: Negative for dysuria, hematuria, flank pain, decreased urine volume, penile swelling, scrotal swelling and penile pain.       Incontinence  Musculoskeletal: Positive for back pain and neck pain.  Neurological: Negative.   All other systems reviewed and are negative.      Objective:   Physical Exam  Constitutional: He is oriented to person, place, and time. He appears well-developed and well-nourished. No distress.  Cardiovascular: Normal rate, regular rhythm, normal heart sounds and intact distal pulses.  Exam reveals no gallop and no friction rub.   No murmur heard. Pulmonary/Chest: Effort normal and breath sounds normal. No respiratory distress. He has no wheezes. He has no rales. He exhibits no tenderness.  Abdominal: Soft. Bowel sounds are normal. He exhibits no distension and no mass. There is no tenderness. There is no rebound and no guarding.  Musculoskeletal: Normal range of motion. He exhibits no edema or tenderness.  Neurological: He is alert and oriented to person, place, and time.  Skin: Skin is warm and dry. No rash noted. He is not diaphoretic. No erythema. No pallor.    Psychiatric: He has a normal mood and affect. His behavior is normal. Judgment and thought content normal.  Nursing note and vitals reviewed.     Assessment & Plan:  1. Chronic fatigue - explained that his fatigue is likely due in part to a fib and beta blocker use.  - Continue with light exercise  2. Continuous leakage of urine - mirabegron ER (MYRBETRIQ) 50 MG TB24 tablet; Take 1 tablet (50 mg total) by mouth daily.  Dispense: 30 tablet; Refill: 6 - Follow up in three weeks or sooner if needed  [name], NP

## 2015-08-08 NOTE — Patient Instructions (Addendum)
I have sent in a prescription for a medication for Myrbetriq, this will help with the urinary incontinence. Take this medication daily.   You can restart the medrol dose pack.   Follow up in three weeks.   Dr. Adele Barthel - Vascular Doctor   Address: [address] [place], [place] [address]  Phone: [phone]

## 2015-08-08 NOTE — Progress Notes (Signed)
Pre visit review using our clinic review tool, if applicable. No additional management support is needed unless otherwise documented below in the visit note. 

## 2015-08-15 ENCOUNTER — Ambulatory Visit (INDEPENDENT_AMBULATORY_CARE_PROVIDER_SITE_OTHER): Payer: Medicare Other | Admitting: Adult Health

## 2015-08-15 ENCOUNTER — Encounter: Payer: Self-pay | Admitting: Adult Health

## 2015-08-15 VITALS — BP 112/76 | Temp 97.4°F | Ht 70.0 in | Wt 125.0 lb

## 2015-08-15 DIAGNOSIS — I25119 Atherosclerotic heart disease of native coronary artery with unspecified angina pectoris: Secondary | ICD-10-CM | POA: Diagnosis not present

## 2015-08-15 DIAGNOSIS — W19XXXS Unspecified fall, sequela: Secondary | ICD-10-CM

## 2015-08-15 DIAGNOSIS — Z760 Encounter for issue of repeat prescription: Secondary | ICD-10-CM

## 2015-08-15 MED ORDER — FUROSEMIDE 40 MG PO TABS
40.0000 mg | ORAL_TABLET | Freq: Every day | ORAL | Status: DC
Start: 2015-08-15 — End: 2015-11-30

## 2015-08-15 MED ORDER — POTASSIUM CHLORIDE ER 10 MEQ PO TBCR: 10.0000 meq | EXTENDED_RELEASE_TABLET | Freq: Every day | ORAL | Status: DC

## 2015-08-15 MED ORDER — METOPROLOL SUCCINATE ER 25 MG PO TB24: 25.0000 mg | ORAL_TABLET | Freq: Every day | ORAL | Status: DC

## 2015-08-15 MED ORDER — MIRABEGRON ER 50 MG PO TB24: 50.0000 mg | ORAL_TABLET | Freq: Every day | ORAL | Status: DC

## 2015-08-15 MED ORDER — PRAVASTATIN SODIUM 20 MG PO TABS: 20.0000 mg | ORAL_TABLET | Freq: Every day | ORAL | Status: DC

## 2015-08-15 NOTE — Patient Instructions (Signed)
I will get your medications squared away before your trip  Take tylenol for neck pain  Be careful when riding your bike.   Use Neosporin on your leg wound
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
## 2015-08-21 ENCOUNTER — Ambulatory Visit (INDEPENDENT_AMBULATORY_CARE_PROVIDER_SITE_OTHER): Payer: Medicare Other | Admitting: Adult Health

## 2015-08-21 ENCOUNTER — Encounter: Payer: Self-pay | Admitting: Adult Health

## 2015-08-21 VITALS — BP 120/62 | Temp 98.3°F | Ht 70.0 in | Wt 125.5 lb

## 2015-08-21 DIAGNOSIS — R5383 Other fatigue: Secondary | ICD-10-CM | POA: Diagnosis not present

## 2015-08-21 DIAGNOSIS — I25119 Atherosclerotic heart disease of native coronary artery with unspecified angina pectoris: Secondary | ICD-10-CM | POA: Diagnosis not present

## 2015-08-21 NOTE — Patient Instructions (Signed)
It was great seeing you again today.   Your exam is not consistent with thyroid cancer. I have no reason to believe that you have thyroid cancer.   As discussed your heart condition and medication is likely the culprit of the fatigue.

## 2015-08-21 NOTE — Progress Notes (Signed)
Subjective:    Patient ID: Jeremy Kelley, male    DOB: 05-10-1926, 82 y.o.   MRN: [id number]  HPI  82 year old male who presents to the office today to inquire about his thyroid. He is worried that due to his trouble swallowing ( chronic issues and sees GI) as well as fatigue, that he may have thyroid cancer.     Review of Systems  Constitutional: Positive for fatigue.  HENT: Positive for sore throat and trouble swallowing.   Hematological: Negative.   All other systems reviewed and are negative.  Past Medical History  Diagnosis Date  . Hypertension   . Colon polyps   . Phimosis   . Carotid artery stenosis 11/2008    bilateral 40-59% stenosis  . Hepatitis     doesn't know which type 1969  . Hyperlipidemia   . Numbness     fingertips  . Urothelial carcinoma of left distal ureter ([place])   . Diverticulosis   . Prostate cancer [facility]) 2001    Social History   Social History  . Marital Status: Single    Spouse Name: N/A  . Number of Children: 0  . Years of Education: N/A   Occupational History  . Retired    Social History Main Topics  . Smoking status: Passive Smoke Exposure - Never Smoker    Types: Cigars  . Smokeless tobacco: Never Used     Comment: 07/29/2015 "might smoke a cigar once/year"  . Alcohol Use: 8.4 oz/week    14 Glasses of wine per week     Comment: 07/29/2015 "big glass of wine q night"  . Drug Use: No  . Sexual Activity: Not on file   Other Topics Concern  . Not on file   Social History Narrative   Patient is single, has never been married.  Does not have any children.  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   He drinks approximately 3-4 glasses of wine per week.  No history of excessive alcohol use.  Occasionally smokes a cigar.   Spends winters in [place]         Past Surgical History  Procedure Laterality Date  . Varicose vein surgery    . Prostate surgery      s/p laparoscopic prostate surgery  . Skin cancer  excision      a/p skin cancer right foot-non melanoma  . Bladder suspension  08/18/10    [name]  . Eye surgery  09/24/10    left eye. "Retina surgery"  . Transurethral resection of bladder tumor  10/01/2011    Procedure: TRANSURETHRAL RESECTION OF BLADDER TUMOR (TURBT);  Surgeon: [name], MD;  [facility];  Service: Urology;  Laterality: N/A;  . Biopsy  10/01/2011    Procedure: BIOPSY;  Surgeon: [name], MD;  [facility];  Service: Urology;;  biopsy of bladder tumor  . Ureteroscopy  01/27/2012    Procedure: URETEROSCOPY;  Surgeon: [name], MD;  [facility];  Service: Urology;  Laterality: Left;  CYSTO, Left RETROGRADE PYELOGRPHY, LEFT URETEROSCOPY   . Cystoscopy w/ retrogrades  01/27/2012    Procedure: CYSTOSCOPY WITH RETROGRADE PYELOGRAM;  Surgeon: [name], MD;  [facility];  Service: Urology;  Laterality: Left;  . Inguinal hernia repair  1960  . Urinary sphincter implant    . Cystoscopy with retrograde pyelogram, ureteroscopy and stent placement Left 11/24/2012    Procedure: CYSTOSCOPY WITH left RETROGRADE PYELOGRAM, bladder washings, ureteroscopy;  Surgeon: [name], MD;  [facility];  Service: Urology;  Laterality: Left;  . Transurethral resection of bladder tumor N/A 11/19/2013    Procedure: TRANSURETHRAL RESECTION OF BLADDER TUMOR (TURBT);  Surgeon: [name], MD;  [facility];  Service: Urology;  Laterality: N/A;  . Cystoscopy with retrograde pyelogram, ureteroscopy and stent placement Bilateral 11/19/2013    Procedure: [place],;  Surgeon: [name], MD;  [facility];  Service: Urology;  Laterality: Bilateral;  . Cataract extraction w/ intraocular lens  implant, bilateral Bilateral   . Cardiac catheterization N/A 07/29/2015    Procedure: Right/Left Heart Cath and Coronary Angiography;  Surgeon: Peter M Martinique, MD;  Location: [place] [facility];  Service: Cardiovascular;  Laterality: N/A;  .  Tee without cardioversion N/A 07/29/2015    Procedure: TRANSESOPHAGEAL ECHOCARDIOGRAM (TEE);  Surgeon: Pixie Casino, MD;  Location: [facility] ENDOSCOPY;  Service: Cardiovascular;  Laterality: N/A;  cath to follow    Family History  Problem Relation Age of Onset  . Colon cancer Neg Hx   . Colon polyps Neg Hx     No Known Allergies  Current Outpatient Prescriptions on File Prior to Visit  Medication Sig Dispense Refill  . apixaban (ELIQUIS) 2.5 MG TABS tablet Take 1 tablet (2.5 mg total) by mouth 2 (two) times daily. 60 tablet 11  . B Complex-C (B-COMPLEX WITH VITAMIN C) tablet Take 1 tablet by mouth daily. Reported on 07/24/2015    . cholecalciferol (VITAMIN D) 1000 units tablet Take 1,000 Units by mouth daily.    . furosemide (LASIX) 40 MG tablet Take 1 tablet (40 mg total) by mouth daily. 100 tablet 0  . methylcellulose (ARTIFICIAL TEARS) 1 % ophthalmic solution Place 1 drop into both eyes 2 (two) times daily.     . metoprolol succinate (TOPROL-XL) 25 MG 24 hr tablet Take 1 tablet (25 mg total) by mouth at bedtime. 90 tablet 0  . mirabegron ER (MYRBETRIQ) 50 MG TB24 tablet Take 1 tablet (50 mg total) by mouth daily. 100 tablet 0  . Multiple Vitamins-Minerals (PRESERVISION/LUTEIN PO) Take 1 tablet by mouth daily.    . potassium chloride (K-DUR) 10 MEQ tablet Take 1 tablet (10 mEq total) by mouth daily. 100 tablet 0  . pravastatin (PRAVACHOL) 20 MG tablet Take 1 tablet (20 mg total) by mouth at bedtime. 100 tablet 0  . fluocinonide cream (LIDEX) [id number] % Apply 1 application topically 2 (two) times daily as needed (skin irritation). Reported on 08/21/2015     No current facility-administered medications on file prior to visit.    BP 120/62 mmHg  Temp(Src) 98.3 F (36.8 C) (Oral)  Ht 5\' 10"  (1.778 m)  Wt 125 lb 8 oz (56.926 kg)  BMI 18.01 kg/m2       Objective:   Physical Exam  Constitutional: He is oriented to person, place, and time. He appears well-developed and well-nourished. No  distress.  Neck: Normal range of motion. Neck supple. No tracheal deviation present. No thyromegaly present.  Cardiovascular: Normal rate, regular rhythm, normal heart sounds and intact distal pulses.  Exam reveals no gallop and no friction rub.   No murmur heard. Pulmonary/Chest: Effort normal and breath sounds normal. No respiratory distress. He has no wheezes. He has no rales. He exhibits no tenderness.  Lymphadenopathy:    He has no cervical adenopathy.  Neurological: He is alert and oriented to person, place, and time.  Skin: Skin is warm and dry. No rash noted. He  is not diaphoretic. No erythema. No pallor.  Psychiatric: He has a normal mood and affect. His behavior is normal. Judgment and thought content normal.  Vitals reviewed.     Assessment & Plan:  1. Other fatigue - Assured Watkins that his TSH levels have been normal for many years. He has had an x ray of his esophagus and that did not show any masses or lesions.  - Continue to believe that fatigue is due to Metoprolol and A fib.  - Did not palpate any nodules on thyroid.  - Follow up as needed  [name], NP

## 2015-08-28 ENCOUNTER — Encounter: Payer: Self-pay | Admitting: Cardiovascular Disease

## 2015-08-28 ENCOUNTER — Ambulatory Visit (INDEPENDENT_AMBULATORY_CARE_PROVIDER_SITE_OTHER): Payer: Medicare Other | Admitting: Cardiovascular Disease

## 2015-08-28 VITALS — BP 136/80 | HR 70 | Ht 70.0 in | Wt 127.4 lb

## 2015-08-28 DIAGNOSIS — I351 Nonrheumatic aortic (valve) insufficiency: Secondary | ICD-10-CM

## 2015-08-28 DIAGNOSIS — I25119 Atherosclerotic heart disease of native coronary artery with unspecified angina pectoris: Secondary | ICD-10-CM | POA: Diagnosis not present

## 2015-08-28 DIAGNOSIS — I272 Other secondary pulmonary hypertension: Secondary | ICD-10-CM | POA: Diagnosis not present

## 2015-08-28 DIAGNOSIS — I482 Chronic atrial fibrillation, unspecified: Secondary | ICD-10-CM

## 2015-08-28 DIAGNOSIS — I10 Essential (primary) hypertension: Secondary | ICD-10-CM

## 2015-08-28 MED ORDER — AMLODIPINE BESYLATE 2.5 MG PO TABS: 2.5000 mg | ORAL_TABLET | Freq: Every day | ORAL | Status: DC
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
## 2015-08-28 NOTE — Patient Instructions (Addendum)
Medication Instructions:  Your physician has recommended you make the following change in your medication:  1.  START Amlodipine 2.5 mg taking 1 tablet daily   Labwork: None ordered  Testing/Procedures: None ordered  Follow-Up: Your physician recommends that you schedule a follow-up appointment in: 3 MONTHS WITH DR. Acie Fredrickson   Any Other Special Instructions Will Be Listed Below (If Applicable).   If you need a refill on your cardiac medications before your next appointment, please call your pharmacy.

## 2015-08-29 ENCOUNTER — Ambulatory Visit (INDEPENDENT_AMBULATORY_CARE_PROVIDER_SITE_OTHER): Payer: Medicare Other | Admitting: Adult Health

## 2015-08-29 ENCOUNTER — Encounter: Payer: Self-pay | Admitting: Adult Health

## 2015-08-29 ENCOUNTER — Ambulatory Visit: Payer: Medicare Other | Admitting: Adult Health

## 2015-08-29 ENCOUNTER — Telehealth: Payer: Self-pay | Admitting: Nurse Practitioner

## 2015-08-29 ENCOUNTER — Ambulatory Visit: Payer: Medicare Other | Admitting: Cardiovascular Disease

## 2015-08-29 VITALS — BP 148/80 | Temp 98.0°F | Ht 70.0 in | Wt 126.1 lb

## 2015-08-29 DIAGNOSIS — R41840 Attention and concentration deficit: Secondary | ICD-10-CM | POA: Diagnosis not present

## 2015-08-29 DIAGNOSIS — I25119 Atherosclerotic heart disease of native coronary artery with unspecified angina pectoris: Secondary | ICD-10-CM

## 2015-08-29 NOTE — Progress Notes (Addendum)
Subjective:    Patient ID: Jeremy Kelley, male    DOB: 1926-11-14, 82 y.o.   MRN: [id number]  HPI  Jeremy Kelley is a 81 year old male, who  has a past medical history of Hypertension; Colon polyps; Phimosis; Carotid artery stenosis (11/2008); Hepatitis; Hyperlipidemia; Numbness; Urothelial carcinoma of left distal ureter ([place]); Diverticulosis; and Prostate cancer ([place]) (2001).  He presents to the office today before going to [place] for the next month and a half. He would like to make sure that all his medications are up to date.   Yesterday he saw his cardiologist, Dr. Cathie Olden who prescribed him 2.5mg  of Amlodipine for pulmonary hypertension. Jeremy Kelley took this medication last night, per patient " I think it makes me feel like I am having trouble with my memory, I feel foggy." This started yesterday evening after taking medication. He feels as though throughout the morning it has been getting better. He has not taken a dose of Amlodipine yet today.    Review of Systems  Constitutional: Negative.   HENT: Negative.   Respiratory: Negative.   Cardiovascular: Negative.   Skin: Negative.   Neurological: Negative.   Psychiatric/Behavioral: Positive for decreased concentration.   Past Medical History  Diagnosis Date  . Hypertension   . Colon polyps   . Phimosis   . Carotid artery stenosis 11/2008    bilateral 40-59% stenosis  . Hepatitis     doesn't know which type 1969  . Hyperlipidemia   . Numbness     fingertips  . Urothelial carcinoma of left distal ureter ([place])   . Diverticulosis   . Prostate cancer [facility]) 2001    Social History   Social History  . Marital Status: Single    Spouse Name: N/A  . Number of Children: 0  . Years of Education: N/A   Occupational History  . Retired    Social History Main Topics  . Smoking status: Passive Smoke Exposure - Never Smoker    Types: Cigars  . Smokeless tobacco: Never Used     Comment: 07/29/2015 "might smoke a cigar once/year"    . Alcohol Use: 8.4 oz/week    14 Glasses of wine per week     Comment: 07/29/2015 "big glass of wine q night"  . Drug Use: No  . Sexual Activity: Not on file   Other Topics Concern  . Not on file   Social History Narrative   Patient is single, has never been married.  Does not have any children.  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   He drinks approximately 3-4 glasses of wine per week.  No history of excessive alcohol use.  Occasionally smokes a cigar.   Spends winters in [place]         Past Surgical History  Procedure Laterality Date  . Varicose vein surgery    . Prostate surgery      s/p laparoscopic prostate surgery  . Skin cancer excision      a/p skin cancer right foot-non melanoma  . Bladder suspension  08/18/10    [name]  . Eye surgery  09/24/10    left eye. "Retina surgery"  . Transurethral resection of bladder tumor  10/01/2011    Procedure: TRANSURETHRAL RESECTION OF BLADDER TUMOR (TURBT);  Surgeon: [name], MD;  [facility];  Service: Urology;  Laterality: N/A;  . Biopsy  10/01/2011    Procedure: BIOPSY;  Surgeon: [name], MD;  [facility];  Service:  Urology;;  biopsy of bladder tumor  . Ureteroscopy  01/27/2012    Procedure: URETEROSCOPY;  Surgeon: [name], MD;  [facility];  Service: Urology;  Laterality: Left;  CYSTO, Left RETROGRADE PYELOGRPHY, LEFT URETEROSCOPY   . Cystoscopy w/ retrogrades  01/27/2012    Procedure: CYSTOSCOPY WITH RETROGRADE PYELOGRAM;  Surgeon: [name], MD;  [facility];  Service: Urology;  Laterality: Left;  . Inguinal hernia repair  1960  . Urinary sphincter implant    . Cystoscopy with retrograde pyelogram, ureteroscopy and stent placement Left 11/24/2012    Procedure: CYSTOSCOPY WITH left RETROGRADE PYELOGRAM, bladder washings, ureteroscopy;  Surgeon: [name], MD;  [facility];  Service: Urology;  Laterality: Left;  . Transurethral resection of bladder tumor N/A 11/19/2013     Procedure: TRANSURETHRAL RESECTION OF BLADDER TUMOR (TURBT);  Surgeon: [name], MD;  [facility];  Service: Urology;  Laterality: N/A;  . Cystoscopy with retrograde pyelogram, ureteroscopy and stent placement Bilateral 11/19/2013    Procedure: [place],;  Surgeon: [name], MD;  [facility];  Service: Urology;  Laterality: Bilateral;  . Cataract extraction w/ intraocular lens  implant, bilateral Bilateral   . Cardiac catheterization N/A 07/29/2015    Procedure: Right/Left Heart Cath and Coronary Angiography;  Surgeon: Peter M Martinique, MD;  Location: [facility];  Service: Cardiovascular;  Laterality: N/A;  . Tee without cardioversion N/A 07/29/2015    Procedure: TRANSESOPHAGEAL ECHOCARDIOGRAM (TEE);  Surgeon: Pixie Casino, MD;  Location: [facility] ENDOSCOPY;  Service: Cardiovascular;  Laterality: N/A;  cath to follow    Family History  Problem Relation Age of Onset  . Colon cancer Neg Hx   . Colon polyps Neg Hx     No Known Allergies  Current Outpatient Prescriptions on File Prior to Visit  Medication Sig Dispense Refill  . amLODipine (NORVASC) 2.5 MG tablet Take 1 tablet (2.5 mg total) by mouth daily. 90 tablet 3  . apixaban (ELIQUIS) 2.5 MG TABS tablet Take 1 tablet (2.5 mg total) by mouth 2 (two) times daily. 60 tablet 11  . B Complex-C (B-COMPLEX WITH VITAMIN C) tablet Take 1 tablet by mouth daily. Reported on 07/24/2015    . cholecalciferol (VITAMIN D) 1000 units tablet Take 1,000 Units by mouth daily.    . furosemide (LASIX) 40 MG tablet Take 1 tablet (40 mg total) by mouth daily. 100 tablet 0  . methylcellulose (ARTIFICIAL TEARS) 1 % ophthalmic solution Place 1 drop into both eyes 2 (two) times daily.     . metoprolol succinate (TOPROL-XL) 25 MG 24 hr tablet Take 1 tablet (25 mg total) by mouth at bedtime. 90 tablet 0  . mirabegron ER (MYRBETRIQ) 50 MG TB24 tablet Take 1 tablet (50 mg total) by mouth daily. 100 tablet 0   . Multiple Vitamins-Minerals (PRESERVISION/LUTEIN PO) Take 1 tablet by mouth daily.    . potassium chloride (K-DUR) 10 MEQ tablet Take 1 tablet (10 mEq total) by mouth daily. 100 tablet 0  . pravastatin (PRAVACHOL) 20 MG tablet Take 1 tablet (20 mg total) by mouth at bedtime. 100 tablet 0   No current facility-administered medications on file prior to visit.    BP 148/80 mmHg  Temp(Src) 98 F (36.7 C) (Oral)  Ht 5\' 10"  (1.778 m)  Wt 126 lb 1.6 oz (57.199 kg)  BMI 18.09 kg/m2       Objective:   Physical Exam  Constitutional: He is oriented to person, place, and time. He  appears well-developed and well-nourished. No distress.  Eyes: Conjunctivae and EOM are normal. Pupils are equal, round, and reactive to light. Right eye exhibits no discharge. Left eye exhibits no discharge.  Neck: Normal range of motion. Neck supple.  Cardiovascular: Normal rate, regular rhythm and intact distal pulses.  Exam reveals no gallop and no friction rub.   Murmur (Soft systolic murmur, 99991111 diastolic murmur ) heard. Pulmonary/Chest: Effort normal and breath sounds normal. No respiratory distress. He has no wheezes. He has no rales. He exhibits no tenderness.  Neurological: He is alert and oriented to person, place, and time.  Skin: Skin is warm and dry. No rash noted. He is not diaphoretic. No erythema. No pallor.  Psychiatric: He has a normal mood and affect. His behavior is normal. Judgment and thought content normal.  Nursing note and vitals reviewed.      Assessment & Plan:  1. Disturbed concentration - Doubt Amlodipine but advised patient that he should not take medication today and see if his symptoms resolved.  - His medications are up to date. - Follow up as needed  [name], NP

## 2015-08-29 NOTE — Telephone Encounter (Signed)
Agree with note from Christen Bame, RN. I doubt the "foggyness" would be caused by amlodipine

## 2015-08-29 NOTE — Telephone Encounter (Signed)
Received call from patient who states Dr. Acie Fredrickson advised him to start amlodipine 2.5 mg at office visit yesterday.  He states he took his first dose last night at bedtime.  He states he felt "foggy and disoriented" when he woke up this morning and he does not want to continue to take amlodipine if this is a side effect.  He is scheduled to fly to [place] on Monday for 6 weeks and he is concerned about traveling while feeling this way.  He was seen by [name], NP at his PCP office this morning.  [name] did not feel that symptoms were caused by the amlodipine.  He calls today because he understands the medication is important for his heart.  I advised him to take a 2nd dose tonight to determine if symptoms occur again tomorrow morning.  I advised him if symptoms reoccur, to hold medication until he is settled in [place].  I encouraged him once he is settled and adjusted to time and climate change, to retry the medication.  If symptoms do not reoccur tomorrow morning, I advised him to continue amlodipine 2.5 mg daily.  He verbalized understanding and agreement with plan and thanked me for the call.

## 2015-09-01 DIAGNOSIS — C44729 Squamous cell carcinoma of skin of left lower limb, including hip: Secondary | ICD-10-CM | POA: Diagnosis not present

## 2015-09-01 DIAGNOSIS — Z85828 Personal history of other malignant neoplasm of skin: Secondary | ICD-10-CM | POA: Diagnosis not present

## 2015-10-10 ENCOUNTER — Encounter: Payer: Medicare Other | Admitting: Vascular Surgery

## 2015-10-10 ENCOUNTER — Encounter (HOSPITAL_COMMUNITY): Payer: Medicare Other

## 2015-10-28 ENCOUNTER — Ambulatory Visit (INDEPENDENT_AMBULATORY_CARE_PROVIDER_SITE_OTHER): Payer: Medicare Other | Admitting: Adult Health

## 2015-10-28 ENCOUNTER — Encounter: Payer: Self-pay | Admitting: Adult Health

## 2015-10-28 VITALS — BP 130/60 | Ht 70.0 in | Wt 128.0 lb

## 2015-10-28 DIAGNOSIS — I25119 Atherosclerotic heart disease of native coronary artery with unspecified angina pectoris: Secondary | ICD-10-CM | POA: Diagnosis not present

## 2015-10-28 DIAGNOSIS — I4891 Unspecified atrial fibrillation: Secondary | ICD-10-CM

## 2015-10-28 DIAGNOSIS — L259 Unspecified contact dermatitis, unspecified cause: Secondary | ICD-10-CM

## 2015-10-28 MED ORDER — TRIAMCINOLONE ACETONIDE 0.5 % EX OINT
1.0000 | TOPICAL_OINTMENT | Freq: Two times a day (BID) | CUTANEOUS | Status: DC
Start: 2015-10-28 — End: 2016-06-29

## 2015-10-28 MED ORDER — APIXABAN 2.5 MG PO TABS: 2.5000 mg | ORAL_TABLET | Freq: Two times a day (BID) | ORAL | Status: DC

## 2015-10-28 NOTE — Patient Instructions (Signed)
It was great seeing you again!  I have sent in a new prescription for Eliquis   Follow up with me before you leave in September

## 2015-10-28 NOTE — Progress Notes (Signed)
Subjective:    Patient ID: Jeremy Kelley, male    DOB: 1926/10/07, 82 y.o.   MRN: [id number]  HPI  82 year old male who  has a past medical history of Hypertension; Colon polyps; Phimosis; Carotid artery stenosis (11/2008); Hepatitis; Hyperlipidemia; Numbness; Urothelial carcinoma of left distal ureter ([place]); Diverticulosis; and Prostate cancer ([place]) (2001).  He presents after vacationing in [place] for the last month a half.   He reports no hospitalizations while in [place].   His only complaints today are that of 1. He has been running out on Eliquis. For the last week he has had to take one pill a day instead of BID  2. He has a red rash on his left ankle. The rash itches has been present for about 2 weeks. Denies warmth or swelling. No drainage or discharge  Review of Systems  Constitutional: Positive for fatigue (chornic ).  Respiratory: Negative.   Cardiovascular: Negative.   Neurological: Negative.   All other systems reviewed and are negative.  Past Medical History  Diagnosis Date  . Hypertension   . Colon polyps   . Phimosis   . Carotid artery stenosis 11/2008    bilateral 40-59% stenosis  . Hepatitis     doesn't know which type 1969  . Hyperlipidemia   . Numbness     fingertips  . Urothelial carcinoma of left distal ureter ([place])   . Diverticulosis   . Prostate cancer [facility]) 2001    Social History   Social History  . Marital Status: Single    Spouse Name: N/A  . Number of Children: 0  . Years of Education: N/A   Occupational History  . Retired    Social History Main Topics  . Smoking status: Passive Smoke Exposure - Never Smoker    Types: Cigars  . Smokeless tobacco: Never Used     Comment: 07/29/2015 "might smoke a cigar once/year"  . Alcohol Use: 8.4 oz/week    14 Glasses of wine per week     Comment: 07/29/2015 "big glass of wine q night"  . Drug Use: No  . Sexual Activity: Not on file   Other Topics Concern  . Not on file   Social  History Narrative   Patient is single, has never been married.  Does not have any children.  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   He drinks approximately 3-4 glasses of wine per week.  No history of excessive alcohol use.  Occasionally smokes a cigar.   Spends winters in [place]         Past Surgical History  Procedure Laterality Date  . Varicose vein surgery    . Prostate surgery      s/p laparoscopic prostate surgery  . Skin cancer excision      a/p skin cancer right foot-non melanoma  . Bladder suspension  08/18/10    [name]  . Eye surgery  09/24/10    left eye. "Retina surgery"  . Transurethral resection of bladder tumor  10/01/2011    Procedure: TRANSURETHRAL RESECTION OF BLADDER TUMOR (TURBT);  Surgeon: [name], MD;  Location: [facility];  Service: Urology;  Laterality: N/A;  . Biopsy  10/01/2011    Procedure: BIOPSY;  Surgeon: [name], MD;  [facility]: [facility];  Service: Urology;;  biopsy of bladder tumor  . Ureteroscopy  01/27/2012    Procedure: URETEROSCOPY;  Surgeon: [name], MD;  [facility]: [facility];  Service: Urology;  Laterality: Left;  CYSTO, Left RETROGRADE PYELOGRPHY, LEFT URETEROSCOPY   . Cystoscopy w/ retrogrades  01/27/2012    Procedure: CYSTOSCOPY WITH RETROGRADE PYELOGRAM;  Surgeon: [name], MD;  [facility]: [facility];  Service: Urology;  Laterality: Left;  . Inguinal hernia repair  1960  . Urinary sphincter implant    . Cystoscopy with retrograde pyelogram, ureteroscopy and stent placement Left 11/24/2012    Procedure: CYSTOSCOPY WITH left RETROGRADE PYELOGRAM, bladder washings, ureteroscopy;  Surgeon: [name], MD;  [facility];  Service: Urology;  Laterality: Left;  . Transurethral resection of bladder tumor N/A 11/19/2013    Procedure: TRANSURETHRAL RESECTION OF BLADDER TUMOR (TURBT);  Surgeon: [name], MD;  [facility];  Service: Urology;  Laterality: N/A;  . Cystoscopy with retrograde pyelogram, ureteroscopy and stent  placement Bilateral 11/19/2013    Procedure: [place],;  Surgeon: [name], MD;  [facility]: [facility];  Service: Urology;  Laterality: Bilateral;  . Cataract extraction w/ intraocular lens  implant, bilateral Bilateral   . Cardiac catheterization N/A 07/29/2015    Procedure: Right/Left Heart Cath and Coronary Angiography;  Surgeon: Peter M Martinique, MD;  Location: [place] CV LAB;  Service: Cardiovascular;  Laterality: N/A;  . Tee without cardioversion N/A 07/29/2015    Procedure: TRANSESOPHAGEAL ECHOCARDIOGRAM (TEE);  Surgeon: Pixie Casino, MD;  Location: [facility] ENDOSCOPY;  Service: Cardiovascular;  Laterality: N/A;  cath to follow    Family History  Problem Relation Age of Onset  . Colon cancer Neg Hx   . Colon polyps Neg Hx     No Known Allergies  Current Outpatient Prescriptions on File Prior to Visit  Medication Sig Dispense Refill  . amLODipine (NORVASC) 2.5 MG tablet Take 1 tablet (2.5 mg total) by mouth daily. 90 tablet 3  . B Complex-C (B-COMPLEX WITH VITAMIN C) tablet Take 1 tablet by mouth daily. Reported on 07/24/2015    . cholecalciferol (VITAMIN D) 1000 units tablet Take 1,000 Units by mouth daily.    . furosemide (LASIX) 40 MG tablet Take 1 tablet (40 mg total) by mouth daily. 100 tablet 0  . methylcellulose (ARTIFICIAL TEARS) 1 % ophthalmic solution Place 1 drop into both eyes 2 (two) times daily.     . metoprolol succinate (TOPROL-XL) 25 MG 24 hr tablet Take 1 tablet (25 mg total) by mouth at bedtime. 90 tablet 0  . mirabegron ER (MYRBETRIQ) 50 MG TB24 tablet Take 1 tablet (50 mg total) by mouth daily. 100 tablet 0  . Multiple Vitamins-Minerals (PRESERVISION/LUTEIN PO) Take 1 tablet by mouth daily.    . potassium chloride (K-DUR) 10 MEQ tablet Take 1 tablet (10 mEq total) by mouth daily. 100 tablet 0  . pravastatin (PRAVACHOL) 20 MG tablet Take 1 tablet (20 mg total) by mouth at bedtime. 100 tablet 0   No current facility-administered  medications on file prior to visit.    BP 130/60 mmHg  Ht 5\' 10"  (1.778 m)  Wt 128 lb (58.06 kg)  BMI 18.37 kg/m2       Objective:   Physical Exam  Constitutional: He is oriented to person, place, and time. He appears well-nourished. No distress.  Eyes: Conjunctivae and EOM are normal. Pupils are equal, round, and reactive to light.  Neck: Normal range of motion. Neck supple.  Cardiovascular: Normal rate, normal heart sounds and intact distal pulses.  An irregularly irregular rhythm present. Exam reveals no gallop and no friction rub.   No murmur heard. A fib   Pulmonary/Chest: Effort normal  and breath sounds normal. No respiratory distress. He has no wheezes. He has no rales. He exhibits no tenderness.  Lymphadenopathy:    He has no cervical adenopathy.  Neurological: He is alert and oriented to person, place, and time.  Skin: Skin is warm and dry. Rash (red macupapular rash on left medial ankle. No redness or swelling. No signs of DVT) noted. He is not diaphoretic. No erythema. No pallor.  Psychiatric: He has a normal mood and affect. His behavior is normal. Judgment and thought content normal.  Nursing note and vitals reviewed.     Assessment & Plan:  1. Atrial fibrillation, unspecified type (HCC) - apixaban (ELIQUIS) 2.5 MG TABS tablet; Take 1 tablet (2.5 mg total) by mouth 2 (two) times daily.  Dispense: 60 tablet; Refill: 11 - Follow up in September before you go back to [place]   2. Contact dermatitis - Appears as contact dermatitis from plant. - triamcinolone ointment (KENALOG) 0.5 %; Apply 1 application topically 2 (two) times daily.  Dispense: 30 g; Refill: 0 - Follow up if no improvement in a week   [name], NP

## 2015-11-04 ENCOUNTER — Ambulatory Visit (INDEPENDENT_AMBULATORY_CARE_PROVIDER_SITE_OTHER): Payer: Medicare Other | Admitting: Adult Health

## 2015-11-04 DIAGNOSIS — I25119 Atherosclerotic heart disease of native coronary artery with unspecified angina pectoris: Secondary | ICD-10-CM

## 2015-11-04 DIAGNOSIS — R5383 Other fatigue: Secondary | ICD-10-CM | POA: Diagnosis not present

## 2015-11-04 NOTE — Progress Notes (Signed)
   Subjective:    Patient ID: Jeremy Kelley, male    DOB: 08-24-1926, 82 y.o.   MRN: [id number]  HPI  82 year old male who presents to the office today for a chronic complaint of increasing fatigue. He reports that since he has come back from [place], he is finding himself becoming more fatigued around 3 pm. This is causing him to have to take naps throughout the afternoon. He found that when he was in [place] he was not having to do this and did not feel fatigued.   He denies any other symptoms and does not have any other complaints.   Review of Systems  Constitutional: Positive for fatigue.  Respiratory: Negative.   Cardiovascular: Negative.   Gastrointestinal: Negative.   Musculoskeletal: Negative.   Skin: Negative.   Neurological: Negative.   All other systems reviewed and are negative.      Objective:   Physical Exam  Constitutional: He appears well-developed and well-nourished. No distress.  Neck: Normal range of motion. Neck supple. No thyromegaly present.  Cardiovascular: Normal rate, regular rhythm, normal heart sounds and intact distal pulses.  Exam reveals no gallop.   No murmur heard. Pulmonary/Chest: Effort normal and breath sounds normal. No respiratory distress. He has no wheezes. He has no rales. He exhibits no tenderness.  Lymphadenopathy:    He has no cervical adenopathy.  Neurological: He is alert.  Skin: Skin is warm and dry. No rash noted. He is not diaphoretic. No erythema. No pallor.  Psychiatric: He has a normal mood and affect. His behavior is normal. Judgment and thought content normal.  Nursing note and vitals reviewed.     Assessment & Plan:  1. Other fatigue - Likely situational or environmental. He was walking more and partaking I more festivities while in [place].  - I am ok with him trying to decrease Metoprolol from 25mg  to 12.5mg  for one week. He is to follow up after that  - Check BP daily - If his BP goes above [id number] systolic he  is to notify me - He is to restart 25mg  Metoprolol if he feels as though he is having palpitations or fast heart rate - Follow up in one week or sooner if needed  [name], NP

## 2015-11-13 ENCOUNTER — Encounter: Payer: Self-pay | Admitting: Vascular Surgery

## 2015-11-14 ENCOUNTER — Encounter: Payer: Self-pay | Admitting: Adult Health

## 2015-11-14 ENCOUNTER — Ambulatory Visit (INDEPENDENT_AMBULATORY_CARE_PROVIDER_SITE_OTHER): Payer: Medicare Other | Admitting: Adult Health

## 2015-11-14 VITALS — BP 122/66 | Temp 98.6°F | Ht 70.0 in | Wt 131.0 lb

## 2015-11-14 DIAGNOSIS — I482 Chronic atrial fibrillation, unspecified: Secondary | ICD-10-CM

## 2015-11-14 DIAGNOSIS — R5383 Other fatigue: Secondary | ICD-10-CM

## 2015-11-14 DIAGNOSIS — M542 Cervicalgia: Secondary | ICD-10-CM

## 2015-11-14 DIAGNOSIS — I25119 Atherosclerotic heart disease of native coronary artery with unspecified angina pectoris: Secondary | ICD-10-CM

## 2015-11-14 NOTE — Progress Notes (Signed)
Subjective:    Patient ID: Jeremy Kelley, male    DOB: 14-Dec-1926, 82 y.o.   MRN: [id number]  HPI  82 year old male who returns to the clinic today for one-week follow-up regarding fatigue. During the last visit I had him decrease his metoprolol from 25 mg a 12.5 mg for one week see if he felt any less fatigued while doing this.  Today in the office he reports that there has been no improvement in the amount of fatigue he is feeling throughout the day. Reports he is getting out of the house and has gone kayaking a couple times since last seeing me. When he is outside he feels fine but when he comes inside it  seems as though that's when the fatigue sets in.  He is also complaining of right-sided neck pain 3 days. This is the same type of pain he had before going to [place]. Feels as though is more of a muscle strain  Review of Systems  Constitutional: Positive for fatigue. Negative for activity change.  Respiratory: Negative.   Cardiovascular: Negative.   Genitourinary: Negative.   Musculoskeletal: Positive for arthralgias, back pain, myalgias, neck pain and neck stiffness. Negative for gait problem and joint swelling.  Neurological: Negative.   Hematological: Negative.   All other systems reviewed and are negative.  Past Medical History:  Diagnosis Date  . Carotid artery stenosis 11/2008   bilateral 40-59% stenosis  . Colon polyps   . Diverticulosis   . Hepatitis    doesn't know which type 1969  . Hyperlipidemia   . Hypertension   . Numbness    fingertips  . Phimosis   . Prostate cancer ([place]) 2001  . Urothelial carcinoma of left distal ureter [facility])     Social History   Social History  . Marital status: Single    Spouse name: N/A  . Number of children: 0  . Years of education: N/A   Occupational History  . Retired    Social History Main Topics  . Smoking status: Passive Smoke Exposure - Never Smoker    Types: Cigars  . Smokeless tobacco: Never Used   Comment: 07/29/2015 "might smoke a cigar once/year"  . Alcohol use 8.4 oz/week    14 Glasses of wine per week     Comment: 07/29/2015 "big glass of wine q night"  . Drug use: No  . Sexual activity: Not on file   Other Topics Concern  . Not on file   Social History Narrative   Patient is single, has never been married.  Does not have any children.  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   He drinks approximately 3-4 glasses of wine per week.  No history of excessive alcohol use.  Occasionally smokes a cigar.   Spends winters in [place]         Past Surgical History:  Procedure Laterality Date  . BIOPSY  10/01/2011   Procedure: BIOPSY;  Surgeon: [name], MD;  [facility];  Service: Urology;;  biopsy of bladder tumor  . BLADDER SUSPENSION  08/18/10   [name]  . CARDIAC CATHETERIZATION N/A 07/29/2015   Procedure: Right/Left Heart Cath and Coronary Angiography;  Surgeon: Peter M Martinique, MD;  Location: [place] CV LAB;  Service: Cardiovascular;  Laterality: N/A;  . CATARACT EXTRACTION W/ INTRAOCULAR LENS  IMPLANT, BILATERAL Bilateral   . CYSTOSCOPY W/ RETROGRADES  01/27/2012   Procedure: CYSTOSCOPY WITH RETROGRADE PYELOGRAM;  Surgeon: [name], MD;  [facility];  Service: Urology;  Laterality: Left;  . CYSTOSCOPY WITH RETROGRADE PYELOGRAM, URETEROSCOPY AND STENT PLACEMENT Left 11/24/2012   Procedure: CYSTOSCOPY WITH left RETROGRADE PYELOGRAM, bladder washings, ureteroscopy;  Surgeon: [name], MD;  [facility];  Service: Urology;  Laterality: Left;  . CYSTOSCOPY WITH RETROGRADE PYELOGRAM, URETEROSCOPY AND STENT PLACEMENT Bilateral 11/19/2013   Procedure: CYSTOSCOPY WITH BILATERAL RETROGRADE PYELOGRAM,;  Surgeon: [name], MD;  [facility];  Service: Urology;  Laterality: Bilateral;  . EYE SURGERY  09/24/10   left eye. "Retina surgery"  . INGUINAL HERNIA REPAIR  1960  . PROSTATE SURGERY     s/p laparoscopic prostate surgery  . SKIN  CANCER EXCISION     a/p skin cancer right foot-non melanoma  . TEE WITHOUT CARDIOVERSION N/A 07/29/2015   Procedure: TRANSESOPHAGEAL ECHOCARDIOGRAM (TEE);  Surgeon: Pixie Casino, MD;  [facility];  Service: Cardiovascular;  Laterality: N/A;  cath to follow  . TRANSURETHRAL RESECTION OF BLADDER TUMOR  10/01/2011   Procedure: TRANSURETHRAL RESECTION OF BLADDER TUMOR (TURBT);  Surgeon: [name], MD;  [facility];  Service: Urology;  Laterality: N/A;  . TRANSURETHRAL RESECTION OF BLADDER TUMOR N/A 11/19/2013   Procedure: TRANSURETHRAL RESECTION OF BLADDER TUMOR (TURBT);  Surgeon: [name], MD;  [facility];  Service: Urology;  Laterality: N/A;  . URETEROSCOPY  01/27/2012   Procedure: URETEROSCOPY;  Surgeon: [name], MD;  [facility];  Service: Urology;  Laterality: Left;  CYSTO, Left RETROGRADE PYELOGRPHY, LEFT URETEROSCOPY   . URINARY SPHINCTER IMPLANT    . VARICOSE VEIN SURGERY      Family History  Problem Relation Age of Onset  . Colon cancer Neg Hx   . Colon polyps Neg Hx     No Known Allergies  Current Outpatient Prescriptions on File Prior to Visit  Medication Sig Dispense Refill  . amLODipine (NORVASC) 2.5 MG tablet Take 1 tablet (2.5 mg total) by mouth daily. 90 tablet 3  . apixaban (ELIQUIS) 2.5 MG TABS tablet Take 1 tablet (2.5 mg total) by mouth 2 (two) times daily. 60 tablet 11  . B Complex-C (B-COMPLEX WITH VITAMIN C) tablet Take 1 tablet by mouth daily. Reported on 07/24/2015    . cholecalciferol (VITAMIN D) 1000 units tablet Take 1,000 Units by mouth daily.    . furosemide (LASIX) 40 MG tablet Take 1 tablet (40 mg total) by mouth daily. 100 tablet 0  . methylcellulose (ARTIFICIAL TEARS) 1 % ophthalmic solution Place 1 drop into both eyes 2 (two) times daily.     . metoprolol succinate (TOPROL-XL) 25 MG 24 hr tablet Take 1 tablet (25 mg total) by mouth at bedtime. 90 tablet 0  . mirabegron ER (MYRBETRIQ) 50 MG TB24 tablet Take 1 tablet (50  mg total) by mouth daily. 100 tablet 0  . Multiple Vitamins-Minerals (PRESERVISION/LUTEIN PO) Take 1 tablet by mouth daily.    . potassium chloride (K-DUR) 10 MEQ tablet Take 1 tablet (10 mEq total) by mouth daily. 100 tablet 0  . pravastatin (PRAVACHOL) 20 MG tablet Take 1 tablet (20 mg total) by mouth at bedtime. 100 tablet 0  . triamcinolone ointment (KENALOG) 0.5 % Apply 1 application topically 2 (two) times daily. 30 g 0   No current facility-administered medications on file prior to visit.     BP 122/66   Temp 98.6 F (37 C) (Oral)   Ht 5\' 10"  (1.778 m)   Wt 131 lb (59.4 kg)   BMI 18.80 kg/m  Objective:   Physical Exam  Constitutional: He is oriented to person, place, and time. He appears well-developed and well-nourished. No distress.  Cardiovascular: Normal rate, normal heart sounds and intact distal pulses.  An irregularly irregular rhythm present. Exam reveals no gallop and no friction rub.   No murmur heard. Pulmonary/Chest: Effort normal and breath sounds normal. No respiratory distress. He has no wheezes. He has no rales. He exhibits no tenderness.  Musculoskeletal: Normal range of motion. He exhibits tenderness (Right trapezius). He exhibits no edema or deformity.  Neurological: He is alert and oriented to person, place, and time.  Skin: Skin is warm and dry. No rash noted. He is not diaphoretic. No erythema. No pallor.  Psychiatric: He has a normal mood and affect. His behavior is normal. Judgment and thought content normal.  Nursing note and vitals reviewed.     Assessment & Plan:  1. Other fatigue - I continue to believe that this is combination of situational and cardiac related. He was advised to continue to stay active as tolerated - Follow-up as needed  2. Neck pain - Appears to be MSK in nature. We have tried muscle relaxers and low-dose prednisone therapy for this type of pain in the past. When we tried these modalities he complained of dizziness and  frequent urination. - He can try warm compresses or sports creams - I will order a cervical spine x-ray to rule out compression fracture - Follow-up as needed  3. Chronic atrial fibrillation ([place]) - He is to go back up to 25 mg of metoprolol - I'll up with cardiology as directed  [name], NP

## 2015-11-14 NOTE — Progress Notes (Deleted)
BP Readings from Last 3 Encounters:  11/14/15 122/66  11/04/15 (!) 142/76  10/28/15 130/60    .last

## 2015-11-17 ENCOUNTER — Ambulatory Visit (INDEPENDENT_AMBULATORY_CARE_PROVIDER_SITE_OTHER)
Admission: RE | Admit: 2015-11-17 | Discharge: 2015-11-17 | Disposition: A | Discharge status: Home / Self Care | Payer: Medicare Other | Source: Ambulatory Visit | Attending: Adult Health | Admitting: Adult Health

## 2015-11-17 DIAGNOSIS — L309 Dermatitis, unspecified: Secondary | ICD-10-CM | POA: Diagnosis not present

## 2015-11-17 DIAGNOSIS — M542 Cervicalgia: Secondary | ICD-10-CM

## 2015-11-17 DIAGNOSIS — S199XXA Unspecified injury of neck, initial encounter: Secondary | ICD-10-CM | POA: Diagnosis not present

## 2015-11-17 DIAGNOSIS — D485 Neoplasm of uncertain behavior of skin: Secondary | ICD-10-CM | POA: Diagnosis not present

## 2015-11-19 ENCOUNTER — Telehealth: Payer: Self-pay | Admitting: Adult Health

## 2015-11-19 NOTE — Telephone Encounter (Signed)
Updated Jeremy Kelley on the results of the cervical spine x ray. All questions answered

## 2015-11-20 ENCOUNTER — Ambulatory Visit (HOSPITAL_COMMUNITY)
Admission: RE | Admit: 2015-11-20 | Discharge: 2015-11-20 | Disposition: A | Discharge status: Home / Self Care | Payer: Medicare Other | Source: Ambulatory Visit | Attending: Vascular Surgery | Admitting: Vascular Surgery

## 2015-11-20 ENCOUNTER — Encounter (HOSPITAL_COMMUNITY): Payer: Self-pay

## 2015-11-20 ENCOUNTER — Other Ambulatory Visit: Payer: Self-pay | Admitting: Adult Health

## 2015-11-20 ENCOUNTER — Ambulatory Visit (INDEPENDENT_AMBULATORY_CARE_PROVIDER_SITE_OTHER): Payer: Medicare Other | Admitting: Vascular Surgery

## 2015-11-20 ENCOUNTER — Encounter: Payer: Self-pay | Admitting: Vascular Surgery

## 2015-11-20 VITALS — BP 138/76 | HR 67 | Temp 97.0°F | Resp 16 | Ht 70.0 in | Wt 127.0 lb

## 2015-11-20 DIAGNOSIS — R0989 Other specified symptoms and signs involving the circulatory and respiratory systems: Secondary | ICD-10-CM

## 2015-11-20 DIAGNOSIS — I83893 Varicose veins of bilateral lower extremities with other complications: Secondary | ICD-10-CM

## 2015-11-20 DIAGNOSIS — R202 Paresthesia of skin: Secondary | ICD-10-CM

## 2015-11-20 DIAGNOSIS — R200 Anesthesia of skin: Secondary | ICD-10-CM

## 2015-11-20 DIAGNOSIS — I25119 Atherosclerotic heart disease of native coronary artery with unspecified angina pectoris: Secondary | ICD-10-CM

## 2015-11-20 DIAGNOSIS — I872 Venous insufficiency (chronic) (peripheral): Secondary | ICD-10-CM

## 2015-11-20 DIAGNOSIS — I10 Essential (primary) hypertension: Secondary | ICD-10-CM | POA: Diagnosis not present

## 2015-11-20 DIAGNOSIS — E785 Hyperlipidemia, unspecified: Secondary | ICD-10-CM | POA: Diagnosis not present

## 2015-11-20 DIAGNOSIS — Z760 Encounter for issue of repeat prescription: Secondary | ICD-10-CM

## 2015-11-20 NOTE — Progress Notes (Signed)
Referring Physician: Nafsinger PA-C Patient name: Jeremy Kelley MRN: [id number] DOB: June 04, 1926 Sex: male  REASON FOR CONSULT: Numbness and tingling in toes  HPI: Jeremy Kelley is a 82 y.o. male,  for numbness and tingling in the toes when lying flat at night. The patient notices the numbness and tingling when he wakes up in the morning. After he gets out of bed and moves around some this improves. He does not describe rest pain. He does not describe claudication. He has had long-term varicose veins. He denies history of DVT. He is on Elavil plus for chronic atrial fibrillation. He has not worn compression stockings in the past. Other medical problems include bilateral moderate carotid stenosis, hyperlipidemia, hypertension, all of these a been stable.  Past Medical History:  Diagnosis Date  . Carotid artery stenosis 11/2008   bilateral 40-59% stenosis  . Colon polyps   . Diverticulosis   . Hepatitis    doesn't know which type 1969  . Hyperlipidemia   . Hypertension   . Numbness    fingertips  . Phimosis   . Prostate cancer ([place]) 2001  . Urothelial carcinoma of left distal ureter  [facility])    Past Surgical History:  Procedure Laterality Date  . BIOPSY  10/01/2011   Procedure: BIOPSY;  Surgeon: [name], MD;  [facility];  Service: Urology;;  biopsy of bladder tumor  . BLADDER SUSPENSION  08/18/10   [name]  . CARDIAC CATHETERIZATION N/A 07/29/2015   Procedure: Right/Left Heart Cath and Coronary Angiography;  Surgeon: Peter M Martinique, MD;  Location: [place] [facility];  Service: Cardiovascular;  Laterality: N/A;  . CATARACT EXTRACTION W/ INTRAOCULAR LENS  IMPLANT, BILATERAL Bilateral   . CYSTOSCOPY W/ RETROGRADES  01/27/2012   Procedure: CYSTOSCOPY WITH RETROGRADE PYELOGRAM;  Surgeon: [name], MD;  [facility];  Service: Urology;  Laterality: Left;  . CYSTOSCOPY WITH RETROGRADE PYELOGRAM, URETEROSCOPY AND STENT PLACEMENT Left 11/24/2012   Procedure: CYSTOSCOPY  WITH left RETROGRADE PYELOGRAM, bladder washings, ureteroscopy;  Surgeon: [name], MD;  [facility];  Service: Urology;  Laterality: Left;  . CYSTOSCOPY WITH RETROGRADE PYELOGRAM, URETEROSCOPY AND STENT PLACEMENT Bilateral 11/19/2013   Procedure: CYSTOSCOPY WITH BILATERAL RETROGRADE PYELOGRAM,;  Surgeon: [name], MD;  [facility];  Service: Urology;  Laterality: Bilateral;  . EYE SURGERY  09/24/10   left eye. "Retina surgery"  . INGUINAL HERNIA REPAIR  1960  . PROSTATE SURGERY     s/p laparoscopic prostate surgery  . SKIN CANCER EXCISION     a/p skin cancer right foot-non melanoma  . TEE WITHOUT CARDIOVERSION N/A 07/29/2015   Procedure: TRANSESOPHAGEAL ECHOCARDIOGRAM (TEE);  Surgeon: Pixie Casino, MD;  Location[facility] END[facility]COPY;  Service: Cardiovascular;  Laterality: N/A;  cath to follow  . TRANSURETHRAL RESECTION OF BLADDER TUMOR  10/01/2011   Procedure: TRANSURETHRAL RESECTION OF BLADDER TUMOR (TURBT);  Surgeon: [name], MD;  [facility];  Service: Urology;  Laterality: N/A;  . TRANSURETHRAL RESECTION OF BLADDER TUMOR N/A 11/19/2013   Procedure: TRANSURETHRAL RESECTION OF BLADDER TUMOR (TURBT);  Surgeon: [name], MD;  [facility];  Service: Urology;  Laterality: N/A;  . URETEROSCOPY  01/27/2012   Procedure: URETEROSCOPY;  Surgeon: [name], MD;  [facility];  Service: Urology;  Laterality: Left;  CYSTO, Left RETROGRADE PYELOGRPHY, LEFT URETEROSCOPY   . URINARY SPHINCTER IMPLANT    . VARICOSE VEIN SURGERY      Family History  Problem Relation Age of Onset  .  Colon cancer Neg Hx   . Colon polyps Neg Hx     SOCIAL HISTORY: Social History   Social History  . Marital status: Single    Spouse name: N/A  . Number of children: 0  . Years of education: N/A   Occupational History  . Retired    Social History Main Topics  . Smoking status: Passive Smoke Exposure - Never Smoker    Types: Cigars  . Smokeless tobacco: Never Used     Comment:  07/29/2015 "might smoke a cigar once/year"  . Alcohol use 8.4 oz/week    14 Glasses of wine per week     Comment: 07/29/2015 "big glass of wine q night"  . Drug use: No  . Sexual activity: Not on file   Other Topics Concern  . Not on file   Social History Narrative   Patient is single, has never been married.  Does not have any children.  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   He drinks approximately 3-4 glasses of wine per week.  No history of excessive alcohol use.  Occasionally smokes a cigar.   Spends winters in [place]         No Known Allergies  Current Outpatient Prescriptions  Medication Sig Dispense Refill  . amLODipine (NORVASC) 2.5 MG tablet Take 1 tablet (2.5 mg total) by mouth daily. 90 tablet 3  . apixaban (ELIQUIS) 2.5 MG TABS tablet Take 1 tablet (2.5 mg total) by mouth 2 (two) times daily. 60 tablet 11  . B Complex-C (B-COMPLEX WITH VITAMIN C) tablet Take 1 tablet by mouth daily. Reported on 07/24/2015    . cholecalciferol (VITAMIN D) 1000 units tablet Take 1,000 Units by mouth daily.    . furosemide (LASIX) 40 MG tablet Take 1 tablet (40 mg total) by mouth daily. 100 tablet 0  . methylcellulose (ARTIFICIAL TEARS) 1 % ophthalmic solution Place 1 drop into both eyes 2 (two) times daily.     . metoprolol succinate (TOPROL-XL) 25 MG 24 hr tablet Take 1 tablet (25 mg total) by mouth at bedtime. 90 tablet 0  . mirabegron ER (MYRBETRIQ) 50 MG TB24 tablet Take 1 tablet (50 mg total) by mouth daily. 100 tablet 0  . Multiple Vitamins-Minerals (PRESERVISION/LUTEIN PO) Take 1 tablet by mouth daily.    . potassium chloride (K-DUR) 10 MEQ tablet Take 1 tablet (10 mEq total) by mouth daily. 100 tablet 0  . pravastatin (PRAVACHOL) 20 MG tablet TAKE 1 TABLET (20 MG TOTAL) BY MOUTH AT BEDTIME. 100 tablet 0  . triamcinolone ointment (KENALOG) 0.5 % Apply 1 application topically 2 (two) times daily. 30 g 0   No current facility-administered medications for this  visit.     ROS:   General:  No weight loss, Fever, chills  HEENT: No recent headaches, no nasal bleeding, no visual changes, no sore throat  Neurologic: No dizziness, blackouts, seizures. No recent symptoms of stroke or mini- stroke. No recent episodes of slurred speech, or temporary blindness.  Cardiac: No recent episodes of chest pain/pressure, no shortness of breath at rest.  No shortness of breath with exertion.  Denies history of atrial fibrillation or irregular heartbeat  Vascular: No history of rest pain in feet.  No history of claudication.  No history of non-healing ulcer, No history of DVT   Pulmonary: No home oxygen, no productive cough, no hemoptysis,  No asthma or wheezing  Musculoskeletal:  [ ]  Arthritis, [ ]  Low back pain,  [ ]   Joint pain  Hematologic:No history of hypercoagulable state.  No history of easy bleeding.  No history of anemia  Gastrointestinal: No hematochezia or melena,  No gastroesophageal reflux, no trouble swallowing  Urinary: [ ]  chronic Kidney disease, [ ]  on HD - [ ]  MWF or [ ]  TTHS, [ ]  Burning with urination, [ ]  Frequent urination, [ ]  Difficulty urinating;   Skin: No rashes  Psychological: No history of anxiety,  No history of depression   Physical Examination  Vitals:   11/20/15 1411 11/20/15 1414  BP: 140/71 138/76  Pulse: 67   Resp: 16   Temp: 97 F (36.1 C)   TempSrc: Oral   SpO2: 97%   Weight: 127 lb (57.6 kg)   Height: 5\' 10"  (1.778 m)     Body mass index is 18.22 kg/m.  General:  Alert and oriented, no acute distress HEENT: Normal Neck: No bruit or JVD Pulmonary: Clear to auscultation bilaterally Cardiac: Irregularly irregular without murmur Abdomen: Soft, non-tender, non-distended, no mass Skin: No rash, diffuse scattered varicosities 3-4 mm in diameter with also several reticular and spider-type varicosities with hemosiderin staining of the gaiter area bilaterally, no ulceration Extremity Pulses:  2+ radial,  brachial, femoral, 3+ popliteal 2+ right posterior tibial pulse absent dorsalis pedis, posterior tibial pulses left foot Musculoskeletal: No deformity or edema  Neurologic: Upper and lower extremity motor 5/5 and symmetric  DATA:  Patient had a venous duplex exam today which showed evidence of deep vein reflux in the common femoral to popliteal vein bilaterally. There is also some chronic thrombus or wall thickening of the left greater saphenous vein.  ASSESSMENT:  Deep vein reflux bilateral lower extremities probably contributing to some of his lower extremity symptoms but not really sure this is causing his numbness. It may cause some intermittent swelling. No intervention for deep vein reflux. The patient also did not have a completely normal pulse exam on the left leg and also had what felt to be a widened popliteal pulse.  PLAN:  #1 patient was prescribed bilateral lower extremity compression stockings 20-30 mmHg today for improvement of symptoms overall. He will also follow-up in 3 weeks for a duplex to make sure he has no popliteal aneurysm and also bilateral ABIs to make sure that his component of numbness is not related to arterial occlusive disease.   Ruta Hinds, MD [facility] Office: [phone] Pager: [phone]

## 2015-11-21 ENCOUNTER — Ambulatory Visit (INDEPENDENT_AMBULATORY_CARE_PROVIDER_SITE_OTHER): Payer: Medicare Other | Admitting: Adult Health

## 2015-11-21 ENCOUNTER — Encounter: Payer: Self-pay | Admitting: Adult Health

## 2015-11-21 VITALS — BP 124/62 | Temp 98.2°F | Ht 70.0 in | Wt 127.8 lb

## 2015-11-21 DIAGNOSIS — I25119 Atherosclerotic heart disease of native coronary artery with unspecified angina pectoris: Secondary | ICD-10-CM

## 2015-11-21 DIAGNOSIS — R937 Abnormal findings on diagnostic imaging of other parts of musculoskeletal system: Secondary | ICD-10-CM | POA: Diagnosis not present

## 2015-11-21 NOTE — Addendum Note (Signed)
Addended by: Mena Goes on: 11/21/2015 01:31 PM   Modules accepted: Orders

## 2015-11-21 NOTE — Progress Notes (Signed)
   Subjective:    Patient ID: Jeremy Kelley, male    DOB: 23-Dec-1926, 82 y.o.   MRN: [id number]  HPI 82 year old male who  has a past medical history of Carotid artery stenosis (11/2008); Colon polyps; Diverticulosis; Hepatitis; Hyperlipidemia; Hypertension; Numbness; Phimosis; Prostate cancer ([place]) (2001); and Urothelial carcinoma of left distal ureter ([place]).   He presents today to discuss his recent x ray results.   The results of his Cervical Spine xray show:  IMPRESSION: Degenerative cervical spondylosis but no acute fracture or abnormal prevertebral soft tissue swelling.   Review of Systems  Constitutional: Negative.   Musculoskeletal: Positive for arthralgias, myalgias and neck pain. Negative for gait problem.  All other systems reviewed and are negative.      Objective:   Physical Exam  Constitutional: He is oriented to person, place, and time. He appears well-developed and well-nourished. No distress.  Neck: Normal range of motion. Neck supple.  Cardiovascular: Normal rate, regular rhythm, normal heart sounds and intact distal pulses.  Exam reveals no gallop and no friction rub.   No murmur heard. Pulmonary/Chest: Effort normal and breath sounds normal. No respiratory distress. He has no wheezes. He has no rales. He exhibits no tenderness.  Musculoskeletal: Normal range of motion. He exhibits no edema, tenderness or deformity.  Lymphadenopathy:    He has no cervical adenopathy.  Neurological: He is alert and oriented to person, place, and time.  Skin: Skin is warm and dry. No rash noted. He is not diaphoretic. No erythema. No pallor.  Psychiatric: He has a normal mood and affect. His behavior is normal. Judgment and thought content normal.  Nursing note and vitals reviewed.     Assessment & Plan:  1. Abnormal musculoskeletal diagnostic imaging - Advised that the findings on the x ray were related to age related wear and tear.  - Motrin or tylenol as needed for  pain - Heating bad - Follow up as needed  [name], NP

## 2015-11-25 DIAGNOSIS — H35343 Macular cyst, hole, or pseudohole, bilateral: Secondary | ICD-10-CM | POA: Diagnosis not present

## 2015-11-25 DIAGNOSIS — H353131 Nonexudative age-related macular degeneration, bilateral, early dry stage: Secondary | ICD-10-CM | POA: Diagnosis not present

## 2015-11-25 DIAGNOSIS — H43813 Vitreous degeneration, bilateral: Secondary | ICD-10-CM | POA: Diagnosis not present

## 2015-11-25 DIAGNOSIS — Z961 Presence of intraocular lens: Secondary | ICD-10-CM | POA: Diagnosis not present

## 2015-11-26 DIAGNOSIS — C44729 Squamous cell carcinoma of skin of left lower limb, including hip: Secondary | ICD-10-CM | POA: Diagnosis not present

## 2015-11-26 DIAGNOSIS — D485 Neoplasm of uncertain behavior of skin: Secondary | ICD-10-CM | POA: Diagnosis not present

## 2015-11-28 DIAGNOSIS — C44729 Squamous cell carcinoma of skin of left lower limb, including hip: Secondary | ICD-10-CM | POA: Diagnosis not present

## 2015-11-30 ENCOUNTER — Other Ambulatory Visit: Payer: Self-pay | Admitting: Adult Health

## 2015-11-30 DIAGNOSIS — Z760 Encounter for issue of repeat prescription: Secondary | ICD-10-CM

## 2015-12-08 ENCOUNTER — Ambulatory Visit (HOSPITAL_COMMUNITY): Payer: Medicare Other | Attending: Cardiology

## 2015-12-08 ENCOUNTER — Ambulatory Visit: Payer: Medicare Other | Admitting: Cardiovascular Disease

## 2015-12-08 ENCOUNTER — Ambulatory Visit (INDEPENDENT_AMBULATORY_CARE_PROVIDER_SITE_OTHER): Payer: Medicare Other | Admitting: Cardiovascular Disease

## 2015-12-08 ENCOUNTER — Encounter: Payer: Self-pay | Admitting: Cardiovascular Disease

## 2015-12-08 ENCOUNTER — Other Ambulatory Visit: Payer: Self-pay

## 2015-12-08 VITALS — BP 120/70 | HR 68 | Ht 70.0 in | Wt 126.2 lb

## 2015-12-08 DIAGNOSIS — I25119 Atherosclerotic heart disease of native coronary artery with unspecified angina pectoris: Secondary | ICD-10-CM | POA: Diagnosis not present

## 2015-12-08 DIAGNOSIS — I482 Chronic atrial fibrillation, unspecified: Secondary | ICD-10-CM

## 2015-12-08 DIAGNOSIS — I2510 Atherosclerotic heart disease of native coronary artery without angina pectoris: Secondary | ICD-10-CM | POA: Diagnosis not present

## 2015-12-08 DIAGNOSIS — R0600 Dyspnea, unspecified: Secondary | ICD-10-CM

## 2015-12-08 DIAGNOSIS — I351 Nonrheumatic aortic (valve) insufficiency: Secondary | ICD-10-CM | POA: Diagnosis not present

## 2015-12-08 DIAGNOSIS — I272 Other secondary pulmonary hypertension: Secondary | ICD-10-CM | POA: Diagnosis not present

## 2015-12-08 DIAGNOSIS — I340 Nonrheumatic mitral (valve) insufficiency: Secondary | ICD-10-CM | POA: Insufficient documentation

## 2015-12-08 DIAGNOSIS — R5383 Other fatigue: Secondary | ICD-10-CM

## 2015-12-08 DIAGNOSIS — I119 Hypertensive heart disease without heart failure: Secondary | ICD-10-CM | POA: Insufficient documentation

## 2015-12-08 DIAGNOSIS — E785 Hyperlipidemia, unspecified: Secondary | ICD-10-CM | POA: Insufficient documentation

## 2015-12-08 DIAGNOSIS — I071 Rheumatic tricuspid insufficiency: Secondary | ICD-10-CM | POA: Insufficient documentation

## 2015-12-08 LAB — ECHOCARDIOGRAM COMPLETE
Height: 70 in
WEIGHTICAEL: 2019.2 [oz_av]
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
## 2015-12-08 NOTE — Patient Instructions (Addendum)
Medication Instructions:  Your physician recommends that you continue on your current medications as directed. Please refer to the Current Medication list given to you today.  Labwork: None ordered   Testing/Procedures: Your physician has requested that you have an echocardiogram. Echocardiography is a painless test that uses sound waves to create images of your heart. It provides your doctor with information about the size and shape of your heart and how well your heart's chambers and valves are working. This procedure takes approximately one hour. There are no restrictions for this procedure.   Follow-Up: Your physician wants you to follow-up in 1-2 days after the echocardiagram with Dr. Acie Fredrickson. Sharyn Lull will call you with an appointment.   For back pain Bjorn Loser physical [phone]     If you need a refill on your cardiac medications before your next appointment, please call your pharmacy.

## 2015-12-09 ENCOUNTER — Encounter: Payer: Self-pay | Admitting: Vascular Surgery

## 2015-12-09 DIAGNOSIS — M5136 Other intervertebral disc degeneration, lumbar region: Secondary | ICD-10-CM | POA: Diagnosis not present

## 2015-12-09 DIAGNOSIS — M9903 Segmental and somatic dysfunction of lumbar region: Secondary | ICD-10-CM | POA: Diagnosis not present

## 2015-12-09 DIAGNOSIS — M50322 Other cervical disc degeneration at C5-C6 level: Secondary | ICD-10-CM | POA: Diagnosis not present

## 2015-12-09 DIAGNOSIS — M9901 Segmental and somatic dysfunction of cervical region: Secondary | ICD-10-CM | POA: Diagnosis not present

## 2015-12-10 ENCOUNTER — Ambulatory Visit (INDEPENDENT_AMBULATORY_CARE_PROVIDER_SITE_OTHER)
Admission: RE | Admit: 2015-12-10 | Discharge: 2015-12-10 | Disposition: A | Discharge status: Home / Self Care | Payer: Medicare Other | Source: Ambulatory Visit | Attending: Vascular Surgery | Admitting: Vascular Surgery

## 2015-12-10 ENCOUNTER — Ambulatory Visit (HOSPITAL_COMMUNITY)
Admission: RE | Admit: 2015-12-10 | Discharge: 2015-12-10 | Disposition: A | Discharge status: Home / Self Care | Payer: Medicare Other | Source: Ambulatory Visit | Attending: Vascular Surgery | Admitting: Vascular Surgery

## 2015-12-10 ENCOUNTER — Ambulatory Visit: Payer: Medicare Other | Admitting: Vascular Surgery

## 2015-12-10 DIAGNOSIS — R938 Abnormal findings on diagnostic imaging of other specified body structures: Secondary | ICD-10-CM | POA: Diagnosis not present

## 2015-12-10 DIAGNOSIS — R0989 Other specified symptoms and signs involving the circulatory and respiratory systems: Secondary | ICD-10-CM | POA: Diagnosis not present

## 2015-12-11 ENCOUNTER — Encounter: Payer: Self-pay | Admitting: Vascular Surgery

## 2015-12-11 ENCOUNTER — Ambulatory Visit (INDEPENDENT_AMBULATORY_CARE_PROVIDER_SITE_OTHER): Payer: Medicare Other | Admitting: Vascular Surgery

## 2015-12-11 VITALS — BP 137/66 | HR 65 | Temp 97.6°F | Resp 16 | Ht 70.0 in | Wt 128.0 lb

## 2015-12-11 DIAGNOSIS — M5136 Other intervertebral disc degeneration, lumbar region: Secondary | ICD-10-CM | POA: Diagnosis not present

## 2015-12-11 DIAGNOSIS — M50322 Other cervical disc degeneration at C5-C6 level: Secondary | ICD-10-CM | POA: Diagnosis not present

## 2015-12-11 DIAGNOSIS — G609 Hereditary and idiopathic neuropathy, unspecified: Secondary | ICD-10-CM

## 2015-12-11 DIAGNOSIS — I25119 Atherosclerotic heart disease of native coronary artery with unspecified angina pectoris: Secondary | ICD-10-CM

## 2015-12-11 DIAGNOSIS — M9903 Segmental and somatic dysfunction of lumbar region: Secondary | ICD-10-CM | POA: Diagnosis not present

## 2015-12-11 DIAGNOSIS — M9901 Segmental and somatic dysfunction of cervical region: Secondary | ICD-10-CM | POA: Diagnosis not present

## 2015-12-11 NOTE — Progress Notes (Signed)
Patient is an 82 year old male who returns for follow-up today. He was previously seen a few weeks ago for numbness and tingling in his feet. He states the numbness persists in the forefoot bilaterally. He denies any claudication symptoms. Prior venous workup showed primarily deep vein reflux and compression therapy was suggested.  Review of systems: He denies rest pain in the feet. He denies shortness of breath or chest pain.  Physical exam:  Vitals:   12/11/15 1220  BP: 137/66  Pulse: 65  Resp: 16  Temp: 97.6 F (36.4 C)  SpO2: 99%  Weight: 128 lb (58.1 kg)  Height: 5\' 10"  (1.778 m)    Extremities: Multiple scattered varicosities with brawny staining lower extremities bilaterally, no skin ulcerations  Data: Patient had a full popliteal pulse on his last exam in a popliteal ultrasound was performed today to exclude aneurysm. There is no evidence of aneurysm bilaterally. The patient also had bilateral ABIs to exclude arterial occlusive disease. ABI on the right was 1.07 left was 0.97 triphasic normal waveforms bilaterally.  Assessment: Patient with bilateral numbness and tingling in his feet no evidence of arterial occlusive disease has some evidence of deep vein reflux but this is not usually cause numbness and tingling. Most likely his symptoms are peripheral neuropathy.   Plan: The patient will follow-up with [place] on as-needed basis.  Ruta Hinds, MD [facility] Office: [phone] Pager: [phone]

## 2015-12-12 ENCOUNTER — Encounter: Payer: Self-pay | Admitting: Adult Health

## 2015-12-12 ENCOUNTER — Encounter: Payer: Self-pay | Admitting: Cardiovascular Disease

## 2015-12-12 ENCOUNTER — Ambulatory Visit (INDEPENDENT_AMBULATORY_CARE_PROVIDER_SITE_OTHER): Payer: Medicare Other | Admitting: Cardiovascular Disease

## 2015-12-12 ENCOUNTER — Ambulatory Visit (INDEPENDENT_AMBULATORY_CARE_PROVIDER_SITE_OTHER): Payer: Medicare Other | Admitting: Adult Health

## 2015-12-12 VITALS — BP 102/58 | Temp 98.6°F | Ht 70.0 in | Wt 129.8 lb

## 2015-12-12 VITALS — BP 124/70 | HR 70 | Ht 70.0 in | Wt 129.0 lb

## 2015-12-12 DIAGNOSIS — I351 Nonrheumatic aortic (valve) insufficiency: Secondary | ICD-10-CM

## 2015-12-12 DIAGNOSIS — I482 Chronic atrial fibrillation, unspecified: Secondary | ICD-10-CM

## 2015-12-12 DIAGNOSIS — Z8551 Personal history of malignant neoplasm of bladder: Secondary | ICD-10-CM | POA: Diagnosis not present

## 2015-12-12 DIAGNOSIS — Z760 Encounter for issue of repeat prescription: Secondary | ICD-10-CM

## 2015-12-12 DIAGNOSIS — I25119 Atherosclerotic heart disease of native coronary artery with unspecified angina pectoris: Secondary | ICD-10-CM | POA: Diagnosis not present

## 2015-12-12 DIAGNOSIS — I10 Essential (primary) hypertension: Secondary | ICD-10-CM

## 2015-12-12 LAB — COMPREHENSIVE METABOLIC PANEL
ALK PHOS: 53 U/L (ref 40–115)
ALT: 13 U/L (ref 9–46)
AST: 30 U/L (ref 10–35)
Albumin: 4 g/dL (ref 3.6–5.1)
BUN: 25 mg/dL (ref 7–25)
CO2: 26 mmol/L (ref 20–31)
CREATININE: 1.32 mg/dL — AB (ref 0.70–1.11)
Calcium: 9.6 mg/dL (ref 8.6–10.3)
Chloride: 103 mmol/L (ref 98–110)
Glucose, Bld: 87 mg/dL (ref 65–99)
Potassium: 4.8 mmol/L (ref 3.5–5.3)
SODIUM: 140 mmol/L (ref 135–146)
TOTAL PROTEIN: 6.5 g/dL (ref 6.1–8.1)
Total Bilirubin: 0.9 mg/dL (ref 0.2–1.2)

## 2015-12-12 LAB — TSH: TSH: 0.94 m[IU]/L (ref 0.40–4.50)

## 2015-12-12 NOTE — Patient Instructions (Signed)
Medication Instructions:  Your physician recommends that you continue on your current medications as directed. Please refer to the Current Medication list given to you today.   Labwork: TODAY - TSH, complete metabolic panel   Testing/Procedures: None Ordered   Follow-Up: Your physician wants you to follow-up in: 7 months with Dr. Acie Fredrickson.  You will receive a reminder letter in the mail two months in advance. If you don't receive a letter, please call our office to schedule the follow-up appointment.   If you need a refill on your cardiac medications before your next appointment, please call your pharmacy.   Thank you for choosing [facility]! Christen Bame, RN [phone]

## 2015-12-12 NOTE — Progress Notes (Signed)
Jeremy Kelley presents today to discuss his echocardiogram. I saw him for a complete visit earlier this week. Please see that progress note for details.   He has chronic atrial fibrillation. He has moderate aortic insufficiency. The echocardiogram performed several days ago has not changed from the echo performed 6 months ago. His symptoms are no worse. He will be leaving for [place] in the next week or so. He'll return in April.  We'll draw some labs today including a basic metabolic profile, TSH, liver enzymes. I'll see him back when he returns in April, 2018.   Mertie Moores, MD  12/12/2015 12:31 PM    [address],  [address] [place], [place]  [phone] Pager [phone] Phone: [phone]; Fax: [phone]

## 2015-12-13 NOTE — Progress Notes (Signed)
Subjective:    Patient ID: Jeremy Kelley, male    DOB: [birth year], 82 y.o.   MRN: [id number]  HPI 82 year old male who presents to the office today to discuss previous diagnostic exams he has had done and to get medication refills. He is going to [place] for the next 6 months and needs all his medication refilled.   He saw Dr. Cathie Olden on 12/08/2015 for follow up regarding dyspnea with exertion and fatigue, likely related to a fib. He was started on Lasix and potassium at his last office visit with Dr. Cathie Olden, Juanda Crumble reported that his breathing had improved but continued to have significant fatigue. His Echo on that day showed  Low normal LVF with EF 50-55%. Atrial fibrillation present.  Severe biatrial enlargement. Moderately dilated LV and moderately  reduced RVF. Moderate TR and PR and moderate pulmonary HTN with  PASP 42mmHg. Moderate diffuse AV thickening with moderate AR. The  right ventricular systolic pressure was increased consistent with  moderate pulmonary hypertension.  There was no change from previous Echo 6 months prior.   He also Dr. Ruta Hinds with vascular surgery this week for follow up regarding numbness and tingling in his feet. Per Dr. Oneida Alar note there is no evidence of arterial occlusive disease but he does have some evidence of deep vein reflux. Most likely his symptoms are from peripheral neuropathy.   Lual also continues to complain of pain in the left side of his neck and intrascapular pain with walking. He saw a chiropractor yesterday, Denman reports that he has had no improvement in the discomfort since seeing the chiropractor.    Review of Systems  Constitutional: Negative.   Respiratory: Negative.   Cardiovascular: Negative.   Gastrointestinal: Negative.   Genitourinary: Negative.   Musculoskeletal: Positive for arthralgias, myalgias and neck pain.  Skin: Negative.   Neurological: Negative.    Past Medical History:  Diagnosis Date    . Carotid artery stenosis 11/2008   bilateral 40-59% stenosis  . Colon polyps   . Diverticulosis   . Hepatitis    doesn't know which type 1969  . Hyperlipidemia   . Hypertension   . Numbness    fingertips  . Phimosis   . Prostate cancer ([place]) 2001  . Urothelial carcinoma of left distal ureter [facility])     Social History   Social History  . Marital status: Single    Spouse name: N/A  . Number of children: 0  . Years of education: N/A   Occupational History  . Retired    Social History Main Topics  . Smoking status: Passive Smoke Exposure - Never Smoker    Types: Cigars  . Smokeless tobacco: Never Used     Comment: 07/29/2015 "might smoke a cigar once/year"  . Alcohol use 8.4 oz/week    14 Glasses of wine per week     Comment: 07/29/2015 "big glass of wine q night"  . Drug use: No  . Sexual activity: Not on file   Other Topics Concern  . Not on file   Social History Narrative   Patient is single, has never been married.  Does not have any children.  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   He drinks approximately 3-4 glasses of wine per week.  No history of excessive alcohol use.  Occasionally smokes a cigar.   Spends winters in [place]         Past Surgical History:  Procedure Laterality Date  .  BIOPSY  10/01/2011   Procedure: BIOPSY;  Surgeon: [name], MD;  Location: [facility];  Service: Urology;;  biopsy of bladder tumor  . BLADDER SUSPENSION  08/18/10   [name]  . CARDIAC CATHETERIZATION N/A 07/29/2015   Procedure: Right/Left Heart Cath and Coronary Angiography;  Surgeon: Peter M Martinique, MD;  Location: [facility];  Service: Cardiovascular;  Laterality: N/A;  . CATARACT EXTRACTION W/ INTRAOCULAR LENS  IMPLANT, BILATERAL Bilateral   . CYSTOSCOPY W/ RETROGRADES  01/27/2012   Procedure: CYSTOSCOPY WITH RETROGRADE PYELOGRAM;  Surgeon: [name], MD;  Location: [facility];  Service: Urology;  Laterality: Left;  . CYSTOSCOPY WITH RETROGRADE  PYELOGRAM, URETEROSCOPY AND STENT PLACEMENT Left 11/24/2012   Procedure: CYSTOSCOPY WITH left RETROGRADE PYELOGRAM, bladder washings, ureteroscopy;  Surgeon: [name], MD;  Location: [facility];  Service: Urology;  Laterality: Left;  . CYSTOSCOPY WITH RETROGRADE PYELOGRAM, URETEROSCOPY AND STENT PLACEMENT Bilateral 11/19/2013   Procedure: CYSTOSCOPY WITH BILATERAL RETROGRADE PYELOGRAM,;  Surgeon: [name], MD;  Location: [facility];  Service: Urology;  Laterality: Bilateral;  . EYE SURGERY  09/24/10   left eye. "Retina surgery"  . INGUINAL HERNIA REPAIR  1960  . PROSTATE SURGERY     s/p laparoscopic prostate surgery  . SKIN CANCER EXCISION     a/p skin cancer right foot-non melanoma  . TEE WITHOUT CARDIOVERSION N/A 07/29/2015   Procedure: TRANSESOPHAGEAL ECHOCARDIOGRAM (TEE);  Surgeon: Pixie Casino, MD;  Location: [facility];  Service: Cardiovascular;  Laterality: N/A;  cath to follow  . TRANSURETHRAL RESECTION OF BLADDER TUMOR  10/01/2011   Procedure: TRANSURETHRAL RESECTION OF BLADDER TUMOR (TURBT);  Surgeon: [name], MD;  Location: [facility];  Service: Urology;  Laterality: N/A;  . TRANSURETHRAL RESECTION OF BLADDER TUMOR N/A 11/19/2013   Procedure: TRANSURETHRAL RESECTION OF BLADDER TUMOR (TURBT);  Surgeon: [name], MD;  Location: [facility];  Service: Urology;  Laterality: N/A;  . URETEROSCOPY  01/27/2012   Procedure: URETEROSCOPY;  Surgeon: [name], MD;  Location: [facility];  Service: Urology;  Laterality: Left;  CYSTO, Left RETROGRADE PYELOGRPHY, LEFT URETEROSCOPY   . URINARY SPHINCTER IMPLANT    . VARICOSE VEIN SURGERY      Family History  Problem Relation Age of Onset  . Colon cancer Neg Hx   . Colon polyps Neg Hx     No Known Allergies  Current Outpatient Prescriptions on File Prior to Visit  Medication Sig Dispense Refill  . amLODipine (NORVASC) 2.5 MG tablet Take 1 tablet (2.5 mg total) by mouth daily. 90 tablet 3  . apixaban (ELIQUIS) 2.5 MG TABS tablet Take 1 tablet  (2.5 mg total) by mouth 2 (two) times daily. 60 tablet 11  . B Complex-C (B-COMPLEX WITH VITAMIN C) tablet Take 1 tablet by mouth daily. Reported on 07/24/2015    . cholecalciferol (VITAMIN D) 1000 units tablet Take 1,000 Units by mouth daily.    . furosemide (LASIX) 40 MG tablet TAKE 1 TABLET (40 MG TOTAL) BY MOUTH DAILY. *PT [place], PLS CALL INS FOR AUTHORIZATION 90 tablet 0  . methylcellulose (ARTIFICIAL TEARS) 1 % ophthalmic solution Place 1 drop into both eyes 2 (two) times daily.     . metoprolol succinate (TOPROL-XL) 25 MG 24 hr tablet Take 1 tablet (25 mg total) by mouth at bedtime. 90 tablet 0  . mirabegron ER (MYRBETRIQ) 50 MG TB24 tablet Take 1 tablet (50 mg total) by mouth daily. 100 tablet 0  . Multiple Vitamins-Minerals (PRESERVISION/LUTEIN PO) Take 1 tablet by  mouth daily.    . potassium chloride (K-DUR) 10 MEQ tablet Take 1 tablet (10 mEq total) by mouth daily. 100 tablet 0  . pravastatin (PRAVACHOL) 20 MG tablet TAKE 1 TABLET (20 MG TOTAL) BY MOUTH AT BEDTIME. 100 tablet 0  . triamcinolone ointment (KENALOG) 0.5 % Apply 1 application topically 2 (two) times daily. 30 g 0   No current facility-administered medications on file prior to visit.     BP (!) 102/58   Temp 98.6 F (37 C) (Oral)   Ht 5\' 10"  (1.778 m)   Wt 129 lb 12.8 oz (58.9 kg)   BMI 18.62 kg/m       Objective:   Physical Exam  Constitutional: He is oriented to person, place, and time. He appears well-developed and well-nourished. No distress.  Cardiovascular: Normal rate, normal heart sounds and intact distal pulses.  An irregularly irregular rhythm present. Exam reveals no gallop and no friction rub.   No murmur heard. Pulmonary/Chest: Effort normal and breath sounds normal. No respiratory distress. He has no wheezes. He has no rales. He exhibits no tenderness.  Musculoskeletal: Normal range of motion. He exhibits no edema, tenderness or deformity.  Neurological: He is alert and oriented to person,  place, and time.  Skin: Skin is warm and dry. No rash noted. He is not diaphoretic. No erythema. No pallor.  Psychiatric: He has a normal mood and affect. His behavior is normal. Judgment and thought content normal.  Nursing note and vitals reviewed.      Assessment & Plan:  1. Chronic atrial fibrillation (HCC) - Stable on current therapy - Follow up with cardiology as needed  2. Essential hypertension - Controlled on current medication  - Will continue to monitor  - Follow up as needed  3. Medication refill - Will call his insurance company about filling medications for 6 months   [name], NP

## 2015-12-16 ENCOUNTER — Emergency Department (HOSPITAL_COMMUNITY)
Admission: EM | Admit: 2015-12-16 | Discharge: 2015-12-16 | Disposition: A | Discharge status: Home / Self Care | Payer: Medicare Other | Attending: Emergency Medicine | Admitting: Emergency Medicine

## 2015-12-16 ENCOUNTER — Encounter (HOSPITAL_COMMUNITY): Payer: Self-pay | Admitting: Emergency Medicine

## 2015-12-16 ENCOUNTER — Emergency Department (HOSPITAL_COMMUNITY): Payer: Medicare Other

## 2015-12-16 DIAGNOSIS — M19049 Primary osteoarthritis, unspecified hand: Secondary | ICD-10-CM

## 2015-12-16 DIAGNOSIS — M79601 Pain in right arm: Secondary | ICD-10-CM

## 2015-12-16 DIAGNOSIS — M19041 Primary osteoarthritis, right hand: Secondary | ICD-10-CM | POA: Diagnosis not present

## 2015-12-16 DIAGNOSIS — Z23 Encounter for immunization: Secondary | ICD-10-CM | POA: Diagnosis not present

## 2015-12-16 DIAGNOSIS — Y9289 Other specified places as the place of occurrence of the external cause: Secondary | ICD-10-CM | POA: Insufficient documentation

## 2015-12-16 DIAGNOSIS — Y999 Unspecified external cause status: Secondary | ICD-10-CM | POA: Insufficient documentation

## 2015-12-16 DIAGNOSIS — Y939 Activity, unspecified: Secondary | ICD-10-CM | POA: Diagnosis not present

## 2015-12-16 DIAGNOSIS — Z79899 Other long term (current) drug therapy: Secondary | ICD-10-CM | POA: Insufficient documentation

## 2015-12-16 DIAGNOSIS — S61431A Puncture wound without foreign body of right hand, initial encounter: Secondary | ICD-10-CM | POA: Diagnosis not present

## 2015-12-16 DIAGNOSIS — M189 Osteoarthritis of first carpometacarpal joint, unspecified: Secondary | ICD-10-CM | POA: Diagnosis not present

## 2015-12-16 DIAGNOSIS — S61451A Open bite of right hand, initial encounter: Secondary | ICD-10-CM | POA: Diagnosis not present

## 2015-12-16 DIAGNOSIS — I10 Essential (primary) hypertension: Secondary | ICD-10-CM | POA: Insufficient documentation

## 2015-12-16 DIAGNOSIS — Z7722 Contact with and (suspected) exposure to environmental tobacco smoke (acute) (chronic): Secondary | ICD-10-CM | POA: Insufficient documentation

## 2015-12-16 DIAGNOSIS — Z8546 Personal history of malignant neoplasm of prostate: Secondary | ICD-10-CM | POA: Insufficient documentation

## 2015-12-16 DIAGNOSIS — W540XXA Bitten by dog, initial encounter: Secondary | ICD-10-CM | POA: Insufficient documentation

## 2015-12-16 DIAGNOSIS — I2510 Atherosclerotic heart disease of native coronary artery without angina pectoris: Secondary | ICD-10-CM | POA: Diagnosis not present

## 2015-12-16 DIAGNOSIS — M79641 Pain in right hand: Secondary | ICD-10-CM | POA: Diagnosis not present

## 2015-12-16 HISTORY — DX: Unspecified osteoarthritis, unspecified site: M19.90

## 2015-12-16 MED ORDER — TETANUS-DIPHTH-ACELL PERTUSSIS 5-2.5-18.5 LF-MCG/0.5 IM SUSP
0.5000 mL | Freq: Once | INTRAMUSCULAR | Status: AC
Start: 2015-12-16 — End: 2015-12-16
  Administered 2015-12-16: 0.5 mL via INTRAMUSCULAR
  Filled 2015-12-16: qty 0.5

## 2015-12-16 MED ORDER — ACETAMINOPHEN 500 MG PO TABS
500.0000 mg | ORAL_TABLET | Freq: Once | ORAL | Status: AC
  Administered 2015-12-16: 500 mg via ORAL
  Filled 2015-12-16: qty 1

## 2015-12-16 MED ORDER — AMOXICILLIN-POT CLAVULANATE 875-125 MG PO TABS: 1.0000 | ORAL_TABLET | Freq: Two times a day (BID) | ORAL | 0 refills | Status: DC

## 2015-12-16 NOTE — Discharge Instructions (Signed)
Keep wound clean with mild soap and water. Keep area covered with a topical antibiotic ointment and bandage, keep bandage dry, and do not submerge in water for 24 hours. Ice and elevate for additional pain relief and swelling. Alternate between Ibuprofen and Tylenol for additional pain relief. Take antibiotic as directed and until completed. Follow up with your primary care doctor or the [facility] [place] in approximately 2-3 days for wound recheck. Monitor area for signs of infection to include, but not limited to: increasing pain, spreading redness, drainage/pus, worsening swelling, or fevers. Return to emergency department for emergent changing or worsening symptoms.

## 2015-12-16 NOTE — ED Triage Notes (Signed)
Patient reports getting bit by a dog last night on his right hand. Unknown whose dog it was. Patient is reporting right arm pain. Hx of arthritis.

## 2015-12-16 NOTE — ED Provider Notes (Signed)
[facility] DEPT Provider Note   CSN: [id number] Arrival date & time: 12/16/15  1216     History   Chief Complaint Chief Complaint  Patient presents with  . Animal Bite    HPI Jeremy Kelley is a 82 y.o. male with a PMHx of HTN, HLD, neuropathy, arthritis, Afib on eliquis, and other medical conditions listed below, who presents to the ED with complaints of right hand dog bite that occurred last night. Patient states that he was at a brewery with his friend and his friend's girlfriend who had her dog with them, the dog was asleep and the patient went to pet the dog and spooked him, accidentally provoking the dog to bite him on his right hand. He knows the dog well, and the dog is in the possession of his friend's girlfriend (the owner). States that the bleeding stopped with pressure, but this morning he developed some right forearm pain. He describes the pain is 2/10 constant soreness in the right forearm and radiating into the right upper arm, worse with palpation, and improved with ibuprofen that he took at 9 AM. Associated symptoms include redness and swelling around the wound. States that the dog is up-to-date on his rabies vaccines. Pt is unsure of his last tetanus shot. He denies any red streaking, warmth, drainage, ongoing bleeding, fevers, chills, chest pain, shortness breath, abdominal pain, nausea, vomiting, diarrhea, constipation, dysuria, hematuria, numbness, tingling, or focal weakness. States that he has chronic neck/shoulder pain for which he sees a chiropractor (has an appt this afternoon), so he's not sure if the pain in his R arm is just from that chronic neck pain or whether it is related to the dog bite, which is why he came in the to ER today for evaluation.    The history is provided by the patient and medical records. No language interpreter was used.  Animal Bite  Contact animal:  Dog Location:  Hand Hand injury location:  [place] of R hand Time since incident:  1  day Pain details:    Quality:  Sore   Severity:  Mild   Timing:  Constant   Progression:  Unchanged Incident location: at a brewery. Provoked: provoked   Notifications:  None Animal's rabies vaccination status:  Up to date Animal in possession: yes (friend's girlfriend's dog, in her possession)   Tetanus status:  Unknown Relieved by:  OTC medications Exacerbated by: palpation. Ineffective treatments:  None tried Associated symptoms: swelling   Associated symptoms: no fever and no numbness     Past Medical History:  Diagnosis Date  . Arthritis   . Carotid artery stenosis 11/2008   bilateral 40-59% stenosis  . Colon polyps   . Diverticulosis   . Hepatitis    doesn't know which type 1969  . Hyperlipidemia   . Hypertension   . Numbness    fingertips  . Phimosis   . Prostate cancer ([place]) 2001  . Urothelial carcinoma of left distal ureter [facility])     Patient Active Problem List   Diagnosis Date Noted  . Fatigue 11/04/2015  . CAD (coronary artery disease) 08/01/2015  . Pulmonary HTN ([place]) 07/24/2015  . Atrial fibrillation ([place]) 07/16/2015  . Aortic insufficiency 01/04/2014  . Squamous cell carcinoma in situ of skin of right lower leg 11/13/2013  . Lentigo maligna of cheek ([place]) 09/19/2013  . Actinic keratosis 11/06/2012  . Macular degeneration 08/10/2012  . Allergic rhinitis 08/10/2012  . Bladder cancer ([place]) 10/19/2011  . Eczema 02/23/2011  .  Hyperglycemia 02/11/2011  . Heart murmur 09/10/2010  . ABNORMAL THYROID FUNCTION TESTS 08/05/2009  . ABNORMAL HEART RHYTHMS 07/11/2009  . HYPERLIPIDEMIA 11/28/2008  . CAROTID BRUIT 11/19/2008  . UNSPECIFIED CATARACT 10/17/2008  . PARESTHESIA 12/04/2007  . VARICOSE VEINS LOWER EXTREMITIES W/OTH COMPS 09/05/2007  . COLONIC POLYPS, HX OF 08/28/2007  . Essential hypertension 02/08/2007  . PROSTATE CANCER, HX OF 11/04/2006  . INGUINAL HERNIA, HX OF 11/04/2006    Past Surgical History:  Procedure Laterality Date  . BIOPSY   10/01/2011   Procedure: BIOPSY;  Surgeon: [name], MD;  [facility];  Service: Urology;;  biopsy of bladder tumor  . BLADDER SUSPENSION  08/18/10   [name]  . CARDIAC CATHETERIZATION N/A 07/29/2015   Procedure: Right/Left Heart Cath and Coronary Angiography;  Surgeon: Peter M Martinique, MD;  Location: [place] CV LAB;  Service: Cardiovascular;  Laterality: N/A;  . CATARACT EXTRACTION W/ INTRAOCULAR LENS  IMPLANT, BILATERAL Bilateral   . CYSTOSCOPY W/ RETROGRADES  01/27/2012   Procedure: CYSTOSCOPY WITH RETROGRADE PYELOGRAM;  Surgeon: [name], MD;  [facility];  Service: Urology;  Laterality: Left;  . CYSTOSCOPY WITH RETROGRADE PYELOGRAM, URETEROSCOPY AND STENT PLACEMENT Left 11/24/2012   Procedure: CYSTOSCOPY WITH left RETROGRADE PYELOGRAM, bladder washings, ureteroscopy;  Surgeon: [name], MD;  [facility];  Service: Urology;  Laterality: Left;  . CYSTOSCOPY WITH RETROGRADE PYELOGRAM, URETEROSCOPY AND STENT PLACEMENT Bilateral 11/19/2013   Procedure: CYSTOSCOPY WITH BILATERAL RETROGRADE PYELOGRAM,;  Surgeon: [name], MD;  [facility];  Service: Urology;  Laterality: Bilateral;  . EYE SURGERY  09/24/10   left eye. "Retina surgery"  . INGUINAL HERNIA REPAIR  1960  . PROSTATE SURGERY     s/p laparoscopic prostate surgery  . SKIN CANCER EXCISION     a/p skin cancer right foot-non melanoma  . TEE WITHOUT CARDIOVERSION N/A 07/29/2015   Procedure: TRANSESOPHAGEAL ECHOCARDIOGRAM (TEE);  Surgeon: Pixie Casino, MD;  Location: [facility] ENDOSCOPY;  Service: Cardiovascular;  Laterality: N/A;  cath to follow  . TRANSURETHRAL RESECTION OF BLADDER TUMOR  10/01/2011   Procedure: TRANSURETHRAL RESECTION OF BLADDER TUMOR (TURBT);  Surgeon: [name], MD;  [facility];  Service: Urology;  Laterality: N/A;  . TRANSURETHRAL RESECTION OF BLADDER TUMOR N/A 11/19/2013   Procedure: TRANSURETHRAL RESECTION OF BLADDER TUMOR (TURBT);  Surgeon: [name], MD;  [facility];   Service: Urology;  Laterality: N/A;  . URETEROSCOPY  01/27/2012   Procedure: URETEROSCOPY;  Surgeon: [name], MD;  [facility];  Service: Urology;  Laterality: Left;  CYSTO, Left RETROGRADE PYELOGRPHY, LEFT URETEROSCOPY   . URINARY SPHINCTER IMPLANT    . VARICOSE VEIN SURGERY         Home Medications    Prior to Admission medications   Medication Sig Start Date End Date Taking? Authorizing Provider  amLODipine (NORVASC) 2.5 MG tablet Take 1 tablet (2.5 mg total) by mouth daily. 08/28/15   Thayer Headings, MD  apixaban (ELIQUIS) 2.5 MG TABS tablet Take 1 tablet (2.5 mg total) by mouth 2 (two) times daily. 10/28/15   [name], NP  B Complex-C (B-COMPLEX WITH VITAMIN C) tablet Take 1 tablet by mouth daily. Reported on 07/24/2015    Historical Provider, MD  cholecalciferol (VITAMIN D) 1000 units tablet Take 1,000 Units by mouth daily.    Historical Provider, MD  furosemide (LASIX) 40 MG tablet TAKE 1 TABLET (40 MG TOTAL) BY MOUTH DAILY. *[place], PLS CALL INS FOR AUTHORIZATION 12/01/15  [name], NP  methylcellulose (ARTIFICIAL TEARS) 1 % ophthalmic solution Place 1 drop into both eyes 2 (two) times daily.     Historical Provider, MD  metoprolol succinate (TOPROL-XL) 25 MG 24 hr tablet Take 1 tablet (25 mg total) by mouth at bedtime. 08/15/15   [name], NP  mirabegron ER (MYRBETRIQ) 50 MG TB24 tablet Take 1 tablet (50 mg total) by mouth daily. 08/15/15   [name], NP  Multiple Vitamins-Minerals (PRESERVISION/LUTEIN PO) Take 1 tablet by mouth daily.    Historical Provider, MD  potassium chloride (K-DUR) 10 MEQ tablet Take 1 tablet (10 mEq total) by mouth daily. 08/15/15   [name], NP  pravastatin (PRAVACHOL) 20 MG tablet TAKE 1 TABLET (20 MG TOTAL) BY MOUTH AT BEDTIME. 11/20/15   [name], NP  triamcinolone ointment (KENALOG) 0.5 % Apply 1 application topically 2 (two) times daily. 10/28/15   [name], NP    Family History Family History  Problem  Relation Age of Onset  . Colon cancer Neg Hx   . Colon polyps Neg Hx     Social History Social History  Substance Use Topics  . Smoking status: Passive Smoke Exposure - Never Smoker    Types: Cigars  . Smokeless tobacco: Never Used     Comment: 07/29/2015 "might smoke a cigar once/year"  . Alcohol use 8.4 oz/week    14 Glasses of wine per week     Comment: 07/29/2015 "big glass of wine q night"     Allergies   Review of patient's allergies indicates no known allergies.   Review of Systems Review of Systems  Constitutional: Negative for chills and fever.  Respiratory: Negative for shortness of breath.   Cardiovascular: Negative for chest pain.  Gastrointestinal: Negative for abdominal pain, constipation, diarrhea, nausea and vomiting.  Genitourinary: Negative for dysuria and hematuria.  Musculoskeletal: Positive for arthralgias (R hand/forearm/upper arm), joint swelling (R hand) and myalgias (R arm).  Skin: Positive for color change and wound.  Allergic/Immunologic: Negative for immunocompromised state.  Neurological: Negative for weakness and numbness.  Hematological: Bruises/bleeds easily (on eliquis).  Psychiatric/Behavioral: Negative for confusion.   10 Systems reviewed and are negative for acute change except as noted in the HPI.   Physical Exam Updated Vital Signs BP 143/81 (BP Location: Right Arm)   Pulse 69   Temp 97.7 F (36.5 C) (Oral)   Resp 18   Ht 5\' 10"  (1.778 m)   Wt 58.5 kg   SpO2 98%   BMI 18.51 kg/m   Physical Exam  Constitutional: He is oriented to person, place, and time. Vital signs are normal. He appears well-developed and well-nourished.  Non-toxic appearance. No distress.  Afebrile, nontoxic, NAD  HENT:  Head: Normocephalic and atraumatic.  Mouth/Throat: Mucous membranes are normal.  Eyes: Conjunctivae and EOM are normal. Right eye exhibits no discharge. Left eye exhibits no discharge.  Neck: Normal range of motion. Neck supple.    Cardiovascular: Normal rate and intact distal pulses.   Pulmonary/Chest: Effort normal. No respiratory distress.  Abdominal: Normal appearance. He exhibits no distension.  Musculoskeletal: Normal range of motion.       Right hand: He exhibits tenderness, laceration and swelling. He exhibits normal range of motion, no bony tenderness, normal two-point discrimination, normal capillary refill and no deformity. Normal sensation noted. Normal strength noted.       Hands: R hand with FROM intact in all digits and at elbow/wrist, with mild TTP around puncture wound on the dorsum of  R hand just proximal to the 5th MCP joint which is scabbed over and has minimal surrounding swelling, erythema, and warmth. Mild TTP into the musculature of the R forearm, no focal bony TTP in the wrist/forearm/shoulder. No swelling to the wrist/forearm/elbow/shoulder, just mild swelling around puncture wound as mentioned above. No ongoing bleeding, scabbed over dried blood on wound. Strength and sensation grossly intact, distal pulses intact, cap refill brisk and present, compartments soft.   Neurological: He is alert and oriented to person, place, and time. He has normal strength. No sensory deficit.  Skin: Skin is warm and dry. Laceration noted. No rash noted.  R hand puncture wound as mentioned above  Psychiatric: He has a normal mood and affect.  Nursing note and vitals reviewed.    ED Treatments / Results  Labs (all labs ordered are listed, but only abnormal results are displayed) Labs Reviewed - No data to display  EKG  EKG Interpretation None       Radiology Dg Hand Complete Right  Result Date: 12/16/2015 CLINICAL DATA:  Dog bite. EXAM: RIGHT HAND - COMPLETE 3+ VIEW COMPARISON:  None. FINDINGS: There is no evidence of fracture or dislocation. Narrowing and sclerosis is seen involving the first carpometacarpal joint. Soft tissues are unremarkable. IMPRESSION: Osteoarthritis of the first carpometacarpal  joint. No acute abnormality seen in the right hand. No radiopaque foreign body is noted. Electronically Signed   By: Marijo Conception, M.D.   On: 12/16/2015 13:38    Procedures Procedures (including critical care time)  Medications Ordered in ED Medications  acetaminophen (TYLENOL) tablet 500 mg (500 mg Oral Given 12/16/15 1354)  Tdap (BOOSTRIX) injection 0.5 mL (0.5 mLs Intramuscular Given 12/16/15 1354)     Initial Impression / Assessment and Plan / ED Course  I have reviewed the triage vital signs and the nursing notes.  Pertinent labs & imaging results that were available during my care of the patient were reviewed by me and considered in my medical decision making (see chart for details).  Clinical Course    82 y.o. male here with R hand dog bite sustained last night, provoked, knows dog well and in contact with his owners. Dog is UTD with vaccines. Pt with Unknown last Tdap, will update today. Tenderness to R hand around the bite, and slight TTP to R forearm musculature. Will obtain xray of R hand to ensure no bony injury, muscular pain likely from strain of muscles when dog bit him. Minimally erythematous and warm to touch around the bite, no ongoing bleeding. FROM intact in all digits, grip strength preserved, NVI with soft compartments, no red streaking or surrounding cellulitis. Will update tetanus, give tylenol (per his request), get xray, and reassess afterwards.  2:29 PM Xray neg for acute injury, some arthritis seen at [facility] joint. He called his friend again and re-confirmed that the dog is UTD with vaccines including rabies vaccines therefore no rabies shots started. Will send home with augmentin BID x7 days. Tylenol/motrin/ice/heat use discussed. F/up with PCP in 2 days for wound recheck. Strict return precautions advised. I explained the diagnosis and have given explicit precautions to return to the ER including for any other new or worsening symptoms. The patient understands and  accepts the medical plan as it's been dictated and I have answered their questions. Discharge instructions concerning home care and prescriptions have been given. The patient is STABLE and is discharged to home in good condition.   Final Clinical Impressions(s) / ED Diagnoses  Final diagnoses:  Dog bite of right hand, initial encounter  Right arm pain  Hand arthritis    New Prescriptions New Prescriptions   AMOXICILLIN-CLAVULANATE (AUGMENTIN) 875-125 MG TABLET    Take 1 tablet by mouth 2 (two) times daily. One po bid x 7 days       [employer], PA-C 12/16/15 1433    Fredia Sorrow, MD 12/18/15 [phone]

## 2015-12-16 NOTE — ED Notes (Signed)
PT STS HE SPOKE WITH THE OWNER OF THE DOG, AND THE IMMUNIZATIONS ARE UP TO DATE.

## 2015-12-17 ENCOUNTER — Other Ambulatory Visit: Payer: Self-pay | Admitting: Adult Health

## 2015-12-17 DIAGNOSIS — M9901 Segmental and somatic dysfunction of cervical region: Secondary | ICD-10-CM | POA: Diagnosis not present

## 2015-12-17 DIAGNOSIS — Z760 Encounter for issue of repeat prescription: Secondary | ICD-10-CM

## 2015-12-17 DIAGNOSIS — M50322 Other cervical disc degeneration at C5-C6 level: Secondary | ICD-10-CM | POA: Diagnosis not present

## 2015-12-17 DIAGNOSIS — M9903 Segmental and somatic dysfunction of lumbar region: Secondary | ICD-10-CM | POA: Diagnosis not present

## 2015-12-17 DIAGNOSIS — M5136 Other intervertebral disc degeneration, lumbar region: Secondary | ICD-10-CM | POA: Diagnosis not present

## 2015-12-17 DIAGNOSIS — I4891 Unspecified atrial fibrillation: Secondary | ICD-10-CM

## 2015-12-17 MED ORDER — FUROSEMIDE 40 MG PO TABS: ORAL_TABLET | ORAL | 0 refills | Status: DC

## 2015-12-17 MED ORDER — POTASSIUM CHLORIDE ER 10 MEQ PO TBCR: 10.0000 meq | EXTENDED_RELEASE_TABLET | Freq: Every day | ORAL | 0 refills | Status: DC

## 2015-12-17 MED ORDER — APIXABAN 2.5 MG PO TABS: 2.5000 mg | ORAL_TABLET | Freq: Two times a day (BID) | ORAL | 0 refills | Status: DC

## 2015-12-17 MED ORDER — MIRABEGRON ER 50 MG PO TB24: 50.0000 mg | ORAL_TABLET | Freq: Every day | ORAL | 0 refills | Status: DC

## 2015-12-17 MED ORDER — AMLODIPINE BESYLATE 2.5 MG PO TABS: 2.5000 mg | ORAL_TABLET | Freq: Every day | ORAL | 0 refills | Status: DC

## 2015-12-17 MED ORDER — METOPROLOL SUCCINATE ER 25 MG PO TB24: 25.0000 mg | ORAL_TABLET | Freq: Every day | ORAL | 0 refills | Status: DC

## 2015-12-17 MED ORDER — PRAVASTATIN SODIUM 20 MG PO TABS: 20.0000 mg | ORAL_TABLET | Freq: Every day | ORAL | 0 refills | Status: DC

## 2015-12-19 ENCOUNTER — Encounter: Payer: Self-pay | Admitting: Adult Health

## 2015-12-19 ENCOUNTER — Ambulatory Visit (INDEPENDENT_AMBULATORY_CARE_PROVIDER_SITE_OTHER): Payer: Medicare Other | Admitting: Adult Health

## 2015-12-19 VITALS — BP 118/60 | Temp 98.0°F | Wt 129.6 lb

## 2015-12-19 DIAGNOSIS — W540XXA Bitten by dog, initial encounter: Secondary | ICD-10-CM

## 2015-12-19 DIAGNOSIS — T148 Other injury of unspecified body region: Secondary | ICD-10-CM | POA: Diagnosis not present

## 2015-12-19 DIAGNOSIS — I25119 Atherosclerotic heart disease of native coronary artery with unspecified angina pectoris: Secondary | ICD-10-CM | POA: Diagnosis not present

## 2015-12-19 DIAGNOSIS — M542 Cervicalgia: Secondary | ICD-10-CM

## 2015-12-19 MED ORDER — LIDOCAINE 5 % EX PTCH: 1.0000 | MEDICATED_PATCH | CUTANEOUS | 0 refills | Status: DC

## 2015-12-19 NOTE — Progress Notes (Signed)
Pre visit review using our clinic review tool, if applicable. No additional management support is needed unless otherwise documented below in the visit note. 

## 2015-12-19 NOTE — Progress Notes (Signed)
Subjective:    Patient ID: Jeremy Kelley, male    DOB: 08/18/1926, 82 y.o.   MRN: [id number]  HPI  82 year old male who presents to the office today for follow up after being seen in the ER 4 days ago for a dog bite to the dorsum of right hand. This was a dog known to the patient and was UTD on all vaccinations.   X ray was negative   He was prescribed a course of Augmentin BID x 7 days   He reports that he feels as though his wound has healed well and he has not had any signs of infection   He continues to have chronic cervical neck pain that is not always relieved by OTC medications .   Review of Systems  Constitutional: Negative.   Respiratory: Negative.   Cardiovascular: Negative.   Musculoskeletal: Negative.   Neurological: Negative.   All other systems reviewed and are negative.  Past Medical History:  Diagnosis Date  . Arthritis   . Carotid artery stenosis 11/2008   bilateral 40-59% stenosis  . Colon polyps   . Diverticulosis   . Hepatitis    doesn't know which type 1969  . Hyperlipidemia   . Hypertension   . Numbness    fingertips  . Phimosis   . Prostate cancer ([place]) 2001  . Urothelial carcinoma of left distal ureter [facility])     Social History   Social History  . Marital status: Single    Spouse name: N/A  . Number of children: 0  . Years of education: N/A   Occupational History  . Retired    Social History Main Topics  . Smoking status: Passive Smoke Exposure - Never Smoker    Types: Cigars  . Smokeless tobacco: Never Used     Comment: 07/29/2015 "might smoke a cigar once/year"  . Alcohol use 8.4 oz/week    14 Glasses of wine per week     Comment: 07/29/2015 "big glass of wine q night"  . Drug use: No  . Sexual activity: Not on file   Other Topics Concern  . Not on file   Social History Narrative   Patient is single, has never been married.  Does not have any children.  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   He  drinks approximately 3-4 glasses of wine per week.  No history of excessive alcohol use.  Occasionally smokes a cigar.   Spends winters in [place]         Past Surgical History:  Procedure Laterality Date  . BIOPSY  10/01/2011   Procedure: BIOPSY;  Surgeon: [name], MD;  [facility];  Service: Urology;;  biopsy of bladder tumor  . BLADDER SUSPENSION  08/18/10   [name]  . CARDIAC CATHETERIZATION N/A 07/29/2015   Procedure: Right/Left Heart Cath and Coronary Angiography;  Surgeon: Peter M Martinique, MD;  Location: [place] CV LAB;  Service: Cardiovascular;  Laterality: N/A;  . CATARACT EXTRACTION W/ INTRAOCULAR LENS  IMPLANT, BILATERAL Bilateral   . CYSTOSCOPY W/ RETROGRADES  01/27/2012   Procedure: CYSTOSCOPY WITH RETROGRADE PYELOGRAM;  Surgeon: [name], MD;  [facility];  Service: Urology;  Laterality: Left;  . CYSTOSCOPY WITH RETROGRADE PYELOGRAM, URETEROSCOPY AND STENT PLACEMENT Left 11/24/2012   Procedure: CYSTOSCOPY WITH left RETROGRADE PYELOGRAM, bladder washings, ureteroscopy;  Surgeon: [name], MD;  [facility];  Service: Urology;  Laterality: Left;  . CYSTOSCOPY WITH RETROGRADE PYELOGRAM, URETEROSCOPY AND  STENT PLACEMENT Bilateral 11/19/2013   Procedure: CYSTOSCOPY WITH BILATERAL RETROGRADE PYELOGRAM,;  Surgeon: [name], MD;  [facility];  Service: Urology;  Laterality: Bilateral;  . EYE SURGERY  09/24/10   left eye. "Retina surgery"  . INGUINAL HERNIA REPAIR  1960  . PROSTATE SURGERY     s/p laparoscopic prostate surgery  . SKIN CANCER EXCISION     a/p skin cancer right foot-non melanoma  . TEE WITHOUT CARDIOVERSION N/A 07/29/2015   Procedure: TRANSESOPHAGEAL ECHOCARDIOGRAM (TEE);  Surgeon: Pixie Casino, MD;  Location: [facility] ENDOSCOPY;  Service: Cardiovascular;  Laterality: N/A;  cath to follow  . TRANSURETHRAL RESECTION OF BLADDER TUMOR  10/01/2011   Procedure: TRANSURETHRAL RESECTION OF BLADDER TUMOR (TURBT);  Surgeon: [name], MD;   [facility];  Service: Urology;  Laterality: N/A;  . TRANSURETHRAL RESECTION OF BLADDER TUMOR N/A 11/19/2013   Procedure: TRANSURETHRAL RESECTION OF BLADDER TUMOR (TURBT);  Surgeon: [name], MD;  [facility];  Service: Urology;  Laterality: N/A;  . URETEROSCOPY  01/27/2012   Procedure: URETEROSCOPY;  Surgeon: [name], MD;  [facility];  Service: Urology;  Laterality: Left;  CYSTO, Left RETROGRADE PYELOGRPHY, LEFT URETEROSCOPY   . URINARY SPHINCTER IMPLANT    . VARICOSE VEIN SURGERY      Family History  Problem Relation Age of Onset  . Colon cancer Neg Hx   . Colon polyps Neg Hx     No Known Allergies  Current Outpatient Prescriptions on File Prior to Visit  Medication Sig Dispense Refill  . amLODipine (NORVASC) 2.5 MG tablet Take 1 tablet (2.5 mg total) by mouth daily. 90 tablet 0  . amoxicillin-clavulanate (AUGMENTIN) 875-125 MG tablet Take 1 tablet by mouth 2 (two) times daily. One po bid x 7 days 14 tablet 0  . apixaban (ELIQUIS) 2.5 MG TABS tablet Take 1 tablet (2.5 mg total) by mouth 2 (two) times daily. 180 tablet 0  . B Complex-C (B-COMPLEX WITH VITAMIN C) tablet Take 1 tablet by mouth daily. Reported on 07/24/2015    . cholecalciferol (VITAMIN D) 1000 units tablet Take 1,000 Units by mouth daily.    . furosemide (LASIX) 40 MG tablet TAKE 1 TABLET (40 MG TOTAL) BY MOUTH DAILY. 90 tablet 0  . methylcellulose (ARTIFICIAL TEARS) 1 % ophthalmic solution Place 1 drop into both eyes 2 (two) times daily.     . metoprolol succinate (TOPROL-XL) 25 MG 24 hr tablet Take 1 tablet (25 mg total) by mouth at bedtime. 90 tablet 0  . mirabegron ER (MYRBETRIQ) 50 MG TB24 tablet Take 1 tablet (50 mg total) by mouth daily. 90 tablet 0  . Multiple Vitamins-Minerals (PRESERVISION/LUTEIN PO) Take 1 tablet by mouth daily.    . potassium chloride (K-DUR) 10 MEQ tablet Take 1 tablet (10 mEq total) by mouth daily. 90 tablet 0  . pravastatin (PRAVACHOL) 20 MG tablet Take 1 tablet (20  mg total) by mouth at bedtime. 90 tablet 0  . triamcinolone ointment (KENALOG) 0.5 % Apply 1 application topically 2 (two) times daily. (Patient not taking: Reported on 12/16/2015) 30 g 0   No current facility-administered medications on file prior to visit.     BP 118/60 (BP Location: Left Arm, Patient Position: Sitting, Cuff Size: Normal)   Temp 98 F (36.7 C) (Oral)   Wt 129 lb 9.6 oz (58.8 kg)   BMI 18.60 kg/m       Objective:   Physical Exam  Constitutional: He is oriented to person, place, and  time. He appears well-developed and well-nourished. No distress.  Cardiovascular: Normal rate, regular rhythm, normal heart sounds and intact distal pulses.  Exam reveals no gallop and no friction rub.   No murmur heard. Pulmonary/Chest: Effort normal and breath sounds normal. No respiratory distress. He has no wheezes. He has no rales. He exhibits no tenderness.  Neurological: He is alert and oriented to person, place, and time. He has normal reflexes. He displays normal reflexes. No cranial nerve deficit. He exhibits normal muscle tone. Coordination normal.  Skin: Skin is warm and dry. No rash noted. He is not diaphoretic. No erythema. No pallor.  Small well healed wound on dorsum of right hand. No signs of infection   Psychiatric: He has a normal mood and affect. His behavior is normal. Judgment and thought content normal.  Nursing note and vitals reviewed.     Assessment & Plan:  1. Dog bite - Continue to monitor  - No signs of infection   2. Neck pain - lidocaine (LIDODERM) 5 %; Place 1 patch onto the skin daily. Remove & Discard patch within 12 hours or as directed by MD  Dispense: 30 patch; Refill: 0  [name], NP

## 2016-06-15 ENCOUNTER — Encounter: Payer: Self-pay | Admitting: Adult Health

## 2016-06-15 ENCOUNTER — Ambulatory Visit (INDEPENDENT_AMBULATORY_CARE_PROVIDER_SITE_OTHER): Payer: Medicare Other | Admitting: Adult Health

## 2016-06-15 VITALS — BP 120/58 | Temp 97.7°F | Ht 70.0 in | Wt 128.8 lb

## 2016-06-15 DIAGNOSIS — R5383 Other fatigue: Secondary | ICD-10-CM | POA: Diagnosis not present

## 2016-06-15 DIAGNOSIS — E559 Vitamin D deficiency, unspecified: Secondary | ICD-10-CM | POA: Diagnosis not present

## 2016-06-15 DIAGNOSIS — R2681 Unsteadiness on feet: Secondary | ICD-10-CM

## 2016-06-15 DIAGNOSIS — E538 Deficiency of other specified B group vitamins: Secondary | ICD-10-CM

## 2016-06-15 DIAGNOSIS — Z760 Encounter for issue of repeat prescription: Secondary | ICD-10-CM | POA: Diagnosis not present

## 2016-06-15 MED ORDER — APIXABAN 2.5 MG PO TABS: 2.5000 mg | ORAL_TABLET | Freq: Two times a day (BID) | ORAL | 0 refills | Status: DC

## 2016-06-15 MED ORDER — AMLODIPINE BESYLATE 2.5 MG PO TABS: 2.5000 mg | ORAL_TABLET | Freq: Every day | ORAL | 3 refills | Status: DC

## 2016-06-15 MED ORDER — MIRABEGRON ER 50 MG PO TB24: 50.0000 mg | ORAL_TABLET | Freq: Every day | ORAL | 0 refills | Status: DC

## 2016-06-15 MED ORDER — PRAVASTATIN SODIUM 20 MG PO TABS: 20.0000 mg | ORAL_TABLET | Freq: Every day | ORAL | 3 refills | Status: DC

## 2016-06-15 NOTE — Progress Notes (Signed)
Subjective:    Patient ID: [place], male    DOB: 04-14-1926, 83 y.o.   MRN: [id number]  HPI   83 year old male who  has a past medical history of Arthritis; Carotid artery stenosis (11/2008); Colon polyps; Diverticulosis; Hepatitis; Hyperlipidemia; Hypertension; Numbness; Phimosis; Prostate cancer ([place]) (2001); and Urothelial carcinoma of left distal ureter ([place]).   He presents today after returning from [place], where he spent the winter. He has multiple complaints that he would like addressed today.  Back and neck pain- this was an issue before he left for [place]. He continues to complain of intermittent cervical spine pain and low back pain. Ports that why he was in [place] this condition worsened when he would be walking around town was his main mode of transportation while in [place]. He denies any numbness or tingling in his lower extremities, loss of bowel or bladder, or headaches. He had an x-ray done while in [place] which showed spondylosis, scoliosis, and dorsal kyphosis  Confusion - often forgets what he is looking. Is not getting lost while driving and is not forgetting names.  Fatigue - again this was a concern of his before he left for [place]. He feels as though he becomes more tired throughout the day but is sleeping well at night. He denies any shortness of breath or chest pain  Impaired gait- this is a new complaint for [place]. He noticed that while in [place] he felt "more off balance". He denies any falls or syncopal episodes while in [place]. He denies feeling lightheaded or dizzy  Review of Systems  Constitutional: Positive for fatigue. Negative for activity change, appetite change and unexpected weight change.  HENT: Negative.   Respiratory: Negative.   Cardiovascular: Negative.   Gastrointestinal: Negative.   Musculoskeletal: Positive for arthralgias, back pain and gait problem.  Neurological: Positive for weakness.   Past Medical  History:  Diagnosis Date  . Arthritis   . Carotid artery stenosis 11/2008   bilateral 40-59% stenosis  . Colon polyps   . Diverticulosis   . Hepatitis    doesn't know which type 1969  . Hyperlipidemia   . Hypertension   . Numbness    fingertips  . Phimosis   . Prostate cancer ([place]) 2001  . Urothelial carcinoma of left distal ureter [facility])     Social History   Social History  . Marital status: Single    Spouse name: N/A  . Number of children: 0  . Years of education: N/A   Occupational History  . Retired    Social History Main Topics  . Smoking status: Passive Smoke Exposure - Never Smoker    Types: Cigars  . Smokeless tobacco: Never Used     Comment: 07/29/2015 "might smoke a cigar once/year"  . Alcohol use 8.4 oz/week    14 Glasses of wine per week     Comment: 07/29/2015 "big glass of wine q night"  . Drug use: No  . Sexual activity: Not on file   Other Topics Concern  . Not on file   Social History Narrative   Patient is single, has never been married.  Does not have any children.  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   He drinks approximately 3-4 glasses of wine per week.  No history of excessive alcohol use.  Occasionally smokes a cigar.   Spends winters in [place]         Past Surgical History:  Procedure Laterality Date  .  BIOPSY  10/01/2011   Procedure: BIOPSY;  Surgeon: [name], MD;  Location: [place];  Service: Urology;;  biopsy of bladder tumor  . BLADDER SUSPENSION  08/18/10   [name]  . CARDIAC CATHETERIZATION N/A 07/29/2015   Procedure: Right/Left Heart Cath and Coronary Angiography;  Surgeon: Peter M Martinique, MD;  Location: [place];  Service: Cardiovascular;  Laterality: N/A;  . CATARACT EXTRACTION W/ INTRAOCULAR LENS  IMPLANT, BILATERAL Bilateral   . CYSTOSCOPY W/ RETROGRADES  01/27/2012   Procedure: CYSTOSCOPY WITH RETROGRADE PYELOGRAM;  Surgeon: [name], MD;  Location: [place];  Service: Urology;   Laterality: Left;  . CYSTOSCOPY WITH RETROGRADE PYELOGRAM, URETEROSCOPY AND STENT PLACEMENT Left 11/24/2012   Procedure: CYSTOSCOPY WITH left RETROGRADE PYELOGRAM, bladder washings, ureteroscopy;  Surgeon: [name], MD;  Location: [place];  Service: Urology;  Laterality: Left;  . CYSTOSCOPY WITH RETROGRADE PYELOGRAM, URETEROSCOPY AND STENT PLACEMENT Bilateral 11/19/2013   Procedure: CYSTOSCOPY WITH BILATERAL RETROGRADE PYELOGRAM,;  Surgeon: [name], MD;  Location: [place];  Service: Urology;  Laterality: Bilateral;  . EYE SURGERY  09/24/10   left eye. "Retina surgery"  . INGUINAL HERNIA REPAIR  1960  . PROSTATE SURGERY     s/p laparoscopic prostate surgery  . SKIN CANCER EXCISION     a/p skin cancer right foot-non melanoma  . TEE WITHOUT CARDIOVERSION N/A 07/29/2015   Procedure: TRANSESOPHAGEAL ECHOCARDIOGRAM (TEE);  Surgeon: Pixie Casino, MD;  Location: [place];  Service: Cardiovascular;  Laterality: N/A;  cath to follow  . TRANSURETHRAL RESECTION OF BLADDER TUMOR  10/01/2011   Procedure: TRANSURETHRAL RESECTION OF BLADDER TUMOR (TURBT);  Surgeon: [name], MD;  Location: [place];  Service: Urology;  Laterality: N/A;  . TRANSURETHRAL RESECTION OF BLADDER TUMOR N/A 11/19/2013   Procedure: TRANSURETHRAL RESECTION OF BLADDER TUMOR (TURBT);  Surgeon: [name], MD;  Location: [place];  Service: Urology;  Laterality: N/A;  . URETEROSCOPY  01/27/2012   Procedure: URETEROSCOPY;  Surgeon: [name], MD;  Location: [place];  Service: Urology;  Laterality: Left;  CYSTO, Left RETROGRADE PYELOGRPHY, LEFT URETEROSCOPY   . URINARY SPHINCTER IMPLANT    . VARICOSE VEIN SURGERY      Family History  Problem Relation Age of Onset  . Colon cancer Neg Hx   . Colon polyps Neg Hx     No Known Allergies  Current Outpatient Prescriptions on File Prior to Visit  Medication Sig Dispense Refill  . B Complex-C (B-COMPLEX WITH VITAMIN C) tablet Take 1 tablet by mouth daily. Reported on 07/24/2015      . cholecalciferol (VITAMIN D) 1000 units tablet Take 1,000 Units by mouth daily.    . furosemide (LASIX) 40 MG tablet TAKE 1 TABLET (40 MG TOTAL) BY MOUTH DAILY. 90 tablet 0  . KLOR-CON M10 10 MEQ tablet     . lidocaine (LIDODERM) 5 % Place 1 patch onto the skin daily. Remove & Discard patch within 12 hours or as directed by MD 30 patch 0  . methylcellulose (ARTIFICIAL TEARS) 1 % ophthalmic solution Place 1 drop into both eyes 2 (two) times daily.     . metoprolol succinate (TOPROL-XL) 25 MG 24 hr tablet Take 1 tablet (25 mg total) by mouth at bedtime. 90 tablet 0  . Multiple Vitamins-Minerals (PRESERVISION/LUTEIN PO) Take 1 tablet by mouth daily.    . potassium chloride (K-DUR) 10 MEQ tablet Take 1 tablet (10 mEq total) by mouth daily. 90 tablet 0  . triamcinolone ointment (KENALOG) 0.5 % Apply  1 application topically 2 (two) times daily. 30 g 0   No current facility-administered medications on file prior to visit.     BP (!) 120/58   Temp 97.7 F (36.5 C) (Oral)   Ht 5\' 10"  (1.778 m)   Wt 128 lb 12.8 oz (58.4 kg)   BMI 18.48 kg/m       Objective:   Physical Exam  Constitutional: He is oriented to person, place, and time. He appears well-developed and well-nourished. No distress.  Cardiovascular: Normal rate, normal heart sounds and intact distal pulses.  An irregularly irregular rhythm present. Exam reveals no gallop and no friction rub.   No murmur heard. Pulmonary/Chest: Effort normal and breath sounds normal. No respiratory distress. He has no wheezes. He has no rales. He exhibits no tenderness.  Abdominal: Soft. Bowel sounds are normal. He exhibits no distension and no mass. There is no tenderness. There is no rebound and no guarding.  Musculoskeletal: Normal range of motion. He exhibits no edema, tenderness or deformity.  Neurological: He is alert and oriented to person, place, and time. He has normal strength and normal reflexes. No cranial nerve deficit or sensory deficit.   Skin: Skin is warm and dry. No rash noted. He is not diaphoretic. No erythema. No pallor.  Psychiatric: He has a normal mood and affect. His behavior is normal. Judgment and thought content normal.  Nursing note and vitals reviewed.     Assessment & Plan:  1. Fatigue, unspecified type - Addressed with [place] this is probably-factorial. Could be caused from metoprolol, as well as his advancing age. - Basic metabolic panel; Future - CBC with Differential/Platelet; Future - TSH; Future - Vitamin D, 25-hydroxy; Future - Vitamin B12; Future  2. Medication refill  - mirabegron ER (MYRBETRIQ) 50 MG TB24 tablet; Take 1 tablet (50 mg total) by mouth daily.  Dispense: 90 tablet; Refill: 0 - amLODipine (NORVASC) 2.5 MG tablet; Take 1 tablet (2.5 mg total) by mouth daily.  Dispense: 90 tablet; Refill: 3 - pravastatin (PRAVACHOL) 20 MG tablet; Take 1 tablet (20 mg total) by mouth at bedtime.  Dispense: 90 tablet; Refill: 3 - apixaban (ELIQUIS) 2.5 MG TABS tablet; Take 1 tablet (2.5 mg total) by mouth 2 (two) times daily.  Dispense: 180 tablet; Refill: 0  3. B12 deficiency  - Vitamin B12; Future  4. Vitamin D deficiency  - Vitamin D, 25-hydroxy; Future  5. Gait instability - Walk with a steady gait in the office today. Will refer to physical therapy for strength training exercises - Ambulatory referral to Physical Therapy - Basic metabolic panel; Future - CBC with Differential/Platelet; Future - TSH; Future - Vitamin D, 25-hydroxy; Future - Vitamin B12; Future  [name], NP\

## 2016-06-16 ENCOUNTER — Other Ambulatory Visit: Payer: Self-pay | Admitting: Adult Health

## 2016-06-16 ENCOUNTER — Other Ambulatory Visit (INDEPENDENT_AMBULATORY_CARE_PROVIDER_SITE_OTHER): Payer: Medicare Other

## 2016-06-16 DIAGNOSIS — E559 Vitamin D deficiency, unspecified: Secondary | ICD-10-CM | POA: Diagnosis not present

## 2016-06-16 DIAGNOSIS — Z760 Encounter for issue of repeat prescription: Secondary | ICD-10-CM

## 2016-06-16 DIAGNOSIS — R2681 Unsteadiness on feet: Secondary | ICD-10-CM

## 2016-06-16 DIAGNOSIS — E538 Deficiency of other specified B group vitamins: Secondary | ICD-10-CM

## 2016-06-16 DIAGNOSIS — R5383 Other fatigue: Secondary | ICD-10-CM | POA: Diagnosis not present

## 2016-06-16 LAB — CBC WITH DIFFERENTIAL/PLATELET
BASOS PCT: 0.5 % (ref 0.0–3.0)
Basophils Absolute: 0 10*3/uL (ref 0.0–0.1)
Eosinophils Absolute: 0.2 10*3/uL (ref 0.0–0.7)
Eosinophils Relative: 2.6 % (ref 0.0–5.0)
HEMATOCRIT: 38.4 % — AB (ref 39.0–52.0)
HEMOGLOBIN: 13.1 g/dL (ref 13.0–17.0)
Lymphocytes Relative: 21.9 % (ref 12.0–46.0)
Lymphs Abs: 1.8 10*3/uL (ref 0.7–4.0)
MCHC: 34 g/dL (ref 30.0–36.0)
MCV: 94.6 fl (ref 78.0–100.0)
MONO ABS: 0.5 10*3/uL (ref 0.1–1.0)
Monocytes Relative: 6.3 % (ref 3.0–12.0)
NEUTROS ABS: 5.8 10*3/uL (ref 1.4–7.7)
Neutrophils Relative %: 68.7 % (ref 43.0–77.0)
Platelets: 203 10*3/uL (ref 150.0–400.0)
RBC: 4.06 Mil/uL — ABNORMAL LOW (ref 4.22–5.81)
RDW: 13.7 % (ref 11.5–15.5)
WBC: 8.4 10*3/uL (ref 4.0–10.5)

## 2016-06-16 LAB — BASIC METABOLIC PANEL
BUN: 21 mg/dL (ref 6–23)
CHLORIDE: 104 meq/L (ref 96–112)
CO2: 33 meq/L — AB (ref 19–32)
CREATININE: 1.05 mg/dL (ref 0.40–1.50)
Calcium: 9.8 mg/dL (ref 8.4–10.5)
GFR: 70.95 mL/min (ref >60.00)
GLUCOSE: 94 mg/dL (ref 70–99)
Potassium: 4.4 mEq/L (ref 3.5–5.1)
Sodium: 142 mEq/L (ref 135–145)

## 2016-06-16 LAB — VITAMIN D 25 HYDROXY (VIT D DEFICIENCY, FRACTURES): VITD: 48.66 ng/mL (ref 30.00–100.00)

## 2016-06-16 LAB — TSH: TSH: 0.79 u[IU]/mL (ref 0.35–4.50)

## 2016-06-16 LAB — VITAMIN B12: Vitamin B-12: >1500 pg/mL — ABNORMAL HIGH (ref 211–911)

## 2016-06-16 MED ORDER — MIRABEGRON ER 50 MG PO TB24: 50.0000 mg | ORAL_TABLET | Freq: Every day | ORAL | 3 refills | Status: DC

## 2016-06-17 ENCOUNTER — Telehealth: Payer: Self-pay | Admitting: Adult Health

## 2016-06-17 NOTE — Telephone Encounter (Signed)
See below.  Would you mind resulting patient's labs? Thanks!

## 2016-06-17 NOTE — Telephone Encounter (Signed)
Attempted to call Sav[facility] multiple times today. Left VM with results of labs. Told to call back with any questions
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
## 2016-06-22 ENCOUNTER — Other Ambulatory Visit: Payer: Self-pay | Admitting: Dermatology

## 2016-06-22 DIAGNOSIS — C44629 Squamous cell carcinoma of skin of left upper limb, including shoulder: Secondary | ICD-10-CM | POA: Diagnosis not present

## 2016-06-22 DIAGNOSIS — H353132 Nonexudative age-related macular degeneration, bilateral, intermediate dry stage: Secondary | ICD-10-CM | POA: Diagnosis not present

## 2016-06-22 DIAGNOSIS — H35341 Macular cyst, hole, or pseudohole, right eye: Secondary | ICD-10-CM | POA: Diagnosis not present

## 2016-06-22 DIAGNOSIS — H35371 Puckering of macula, right eye: Secondary | ICD-10-CM | POA: Diagnosis not present

## 2016-06-22 DIAGNOSIS — L309 Dermatitis, unspecified: Secondary | ICD-10-CM | POA: Diagnosis not present

## 2016-06-22 DIAGNOSIS — C44722 Squamous cell carcinoma of skin of right lower limb, including hip: Secondary | ICD-10-CM | POA: Diagnosis not present

## 2016-06-23 ENCOUNTER — Ambulatory Visit: Payer: Medicare Other | Admitting: Physical Therapy

## 2016-06-24 ENCOUNTER — Encounter: Payer: Self-pay | Admitting: Cardiovascular Disease

## 2016-06-29 ENCOUNTER — Ambulatory Visit (INDEPENDENT_AMBULATORY_CARE_PROVIDER_SITE_OTHER): Payer: Medicare Other | Admitting: Adult Health

## 2016-06-29 ENCOUNTER — Encounter: Payer: Self-pay | Admitting: Adult Health

## 2016-06-29 VITALS — BP 134/60 | Ht 70.0 in | Wt 131.2 lb

## 2016-06-29 DIAGNOSIS — R2681 Unsteadiness on feet: Secondary | ICD-10-CM | POA: Diagnosis not present

## 2016-06-29 DIAGNOSIS — I83893 Varicose veins of bilateral lower extremities with other complications: Secondary | ICD-10-CM | POA: Diagnosis not present

## 2016-06-29 DIAGNOSIS — N3941 Urge incontinence: Secondary | ICD-10-CM | POA: Diagnosis not present

## 2016-06-29 NOTE — Progress Notes (Signed)
Subjective:    Patient ID: [place], male    DOB: Jan 14, 1927, 83 y.o.   MRN: [id number]  HPI  83 year old male who  has a past medical history of Arthritis; Carotid artery stenosis (11/2008); Colon polyps; Diverticulosis; Hepatitis; Hyperlipidemia; Hypertension; Numbness; Phimosis; Prostate cancer ([place]) (2001); and Urothelial carcinoma of left distal ureter ([place]). He presents to the office today to discuss multiple issues   1. Physical therapy- he was referred to physical therapy during his last visit 2 weeks ago. He reports that he went to the wrong office and his appointment was canceled. He would like a new referral to see physical therapy for strength training exercises  2. He has been having issues with worsening incontinence. He has a follow-up with urology but he is on her present anything he can do at this point in time before he sees urology. His incontinence issues happen throughout the day and he feels like he has to use the restroom "once every hour". He does not have any incontinence issues when he lays down to go to sleep. He is currently  taking Myrbetriq  50 mg as well as Lasix 40 mg daily  3. Like to turn return to vascular surgery to talk about his varicose veins. He states he tried to call make an appointment but needs a referral from me. He has seen Dr. Juanda [place] in the past and would like to see him again  Review of Systems   See HPI  Past Medical History:  Diagnosis Date  . Arthritis   . Carotid artery stenosis 11/2008   bilateral 40-59% stenosis  . Colon polyps   . Diverticulosis   . Hepatitis    doesn't know which type 1969  . Hyperlipidemia   . Hypertension   . Numbness    fingertips  . Phimosis   . Prostate cancer ([place]) 2001  . Urothelial carcinoma of left distal ureter [facility])     Social History   Social History  . Marital status: Single    Spouse name: N/A  . Number of children: 0  . Years of education: N/A   Occupational History  .  Retired    Social History Main Topics  . Smoking status: Passive Smoke Exposure - Never Smoker    Types: Cigars  . Smokeless tobacco: Never Used     Comment: 07/29/2015 "might smoke a cigar once/year"  . Alcohol use 8.4 oz/week    14 Glasses of wine per week     Comment: 07/29/2015 "big glass of wine q night"  . Drug use: No  . Sexual activity: Not on file   Other Topics Concern  . Not on file   Social History Narrative   Patient is single, has never been married.  Does not have any children.  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   He drinks approximately 3-4 glasses of wine per week.  No history of excessive alcohol use.  Occasionally smokes a cigar.   Spends winters in [place]         Past Surgical History:  Procedure Laterality Date  . BIOPSY  10/01/2011   Procedure: BIOPSY;  Surgeon: [name], MD;  Location: [facility];  Service: Urology;;  biopsy of bladder tumor  . BLADDER SUSPENSION  08/18/10   [name]  . CARDIAC CATHETERIZATION N/A 07/29/2015   Procedure: Right/Left Heart Cath and Coronary Angiography;  Surgeon: Peter M Martinique, MD;  Location: [place] [facility];  Service: Cardiovascular;  Laterality: N/A;  . CATARACT EXTRACTION W/ INTRAOCULAR LENS  IMPLANT, BILATERAL Bilateral   . CYSTOSCOPY W/ RETROGRADES  01/27/2012   Procedure: CYSTOSCOPY WITH RETROGRADE PYELOGRAM;  Surgeon: [name], MD;  Location: [facility];  Service: Urology;  Laterality: Left;  . CYSTOSCOPY WITH RETROGRADE PYELOGRAM, URETEROSCOPY AND STENT PLACEMENT Left 11/24/2012   Procedure: CYSTOSCOPY WITH left RETROGRADE PYELOGRAM, bladder washings, ureteroscopy;  Surgeon: [name], MD;  Location: [facility];  Service: Urology;  Laterality: Left;  . CYSTOSCOPY WITH RETROGRADE PYELOGRAM, URETEROSCOPY AND STENT PLACEMENT Bilateral 11/19/2013   Procedure: CYSTOSCOPY WITH BILATERAL RETROGRADE PYELOGRAM,;  Surgeon: [name], MD;  Location: [facility];  Service: Urology;  Laterality: Bilateral;  .  EYE SURGERY  09/24/10   left eye. "Retina surgery"  . INGUINAL HERNIA REPAIR  1960  . PROSTATE SURGERY     s/p laparoscopic prostate surgery  . SKIN CANCER EXCISION     a/p skin cancer right foot-non melanoma  . TEE WITHOUT CARDIOVERSION N/A 07/29/2015   Procedure: TRANSESOPHAGEAL ECHOCARDIOGRAM (TEE);  Surgeon: Pixie Casino, MD;  Location: [facility];  Service: Cardiovascular;  Laterality: N/A;  cath to follow  . TRANSURETHRAL RESECTION OF BLADDER TUMOR  10/01/2011   Procedure: TRANSURETHRAL RESECTION OF BLADDER TUMOR (TURBT);  Surgeon: [name], MD;  [facility];  Service: Urology;  Laterality: N/A;  . TRANSURETHRAL RESECTION OF BLADDER TUMOR N/A 11/19/2013   Procedure: TRANSURETHRAL RESECTION OF BLADDER TUMOR (TURBT);  Surgeon: [name], MD;  [facility];  Service: Urology;  Laterality: N/A;  . URETEROSCOPY  01/27/2012   Procedure: URETEROSCOPY;  Surgeon: [name], MD;  [facility]: [facility];  Service: Urology;  Laterality: Left;  CYSTO, Left RETROGRADE PYELOGRPHY, LEFT URETEROSCOPY   . URINARY SPHINCTER IMPLANT    . VARICOSE VEIN SURGERY      Family History  Problem Relation Age of Onset  . Colon cancer Neg Hx   . Colon polyps Neg Hx     No Known Allergies  Current Outpatient Prescriptions on File Prior to Visit  Medication Sig Dispense Refill  . amLODipine (NORVASC) 2.5 MG tablet Take 1 tablet (2.5 mg total) by mouth daily. 90 tablet 3  . apixaban (ELIQUIS) 2.5 MG TABS tablet Take 1 tablet (2.5 mg total) by mouth 2 (two) times daily. 180 tablet 0  . B Complex-C (B-COMPLEX WITH VITAMIN C) tablet Take 1 tablet by mouth daily. Reported on 07/24/2015    . cholecalciferol (VITAMIN D) 1000 units tablet Take 1,000 Units by mouth daily.    . furosemide (LASIX) 40 MG tablet TAKE 1 TABLET (40 MG TOTAL) BY MOUTH DAILY. 90 tablet 0  . KLOR-CON M10 10 MEQ tablet     . methylcellulose (ARTIFICIAL TEARS) 1 % ophthalmic solution Place 1 drop into both eyes 2 (two) times daily.      . metoprolol succinate (TOPROL-XL) 25 MG 24 hr tablet Take 1 tablet (25 mg total) by mouth at bedtime. 90 tablet 0  . mirabegron ER (MYRBETRIQ) 50 MG TB24 tablet Take 1 tablet (50 mg total) by mouth daily. 90 tablet 3  . Multiple Vitamins-Minerals (PRESERVISION/LUTEIN PO) Take 1 tablet by mouth daily.    . pravastatin (PRAVACHOL) 20 MG tablet Take 1 tablet (20 mg total) by mouth at bedtime. 90 tablet 3   No current facility-administered medications on file prior to visit.     BP 134/60 (BP Location: Right Arm, Patient Position: Sitting, Cuff Size: Normal)   Ht 5\' 10"  (1.778 m)  Wt 131 lb 3.2 oz (59.5 kg)   BMI 18.83 kg/m        Objective:   Physical Exam  Constitutional: He is oriented to person, place, and time. He appears well-developed and well-nourished. No distress.  Cardiovascular: Normal rate, regular rhythm, normal heart sounds and intact distal pulses.  Exam reveals no gallop and no friction rub.   No murmur heard. Pulmonary/Chest: Effort normal and breath sounds normal. No respiratory distress. He has no wheezes. He has no rales. He exhibits no tenderness.  Neurological: He is alert and oriented to person, place, and time.  Skin: Skin is warm and dry. No rash noted. He is not diaphoretic. No erythema. No pallor.  + varicose veins   Psychiatric: He has a normal mood and affect. His behavior is normal. Judgment and thought content normal.  Nursing note and vitals reviewed.     Assessment & Plan:  1. Varicose veins of bilateral lower extremities with other complications  - Ambulatory referral to Vascular Surgery  2. Gait instability  - Ambulatory referral to Physical Therapy  3. Urge incontinence of urine - Will cut back Lasix to 20 mg daily  - Follow up as needed   [name], NP

## 2016-07-06 ENCOUNTER — Ambulatory Visit (INDEPENDENT_AMBULATORY_CARE_PROVIDER_SITE_OTHER): Payer: Medicare Other | Admitting: Cardiovascular Disease

## 2016-07-06 ENCOUNTER — Encounter: Payer: Self-pay | Admitting: Cardiovascular Disease

## 2016-07-06 VITALS — BP 136/66 | HR 64 | Ht 70.0 in | Wt 133.8 lb

## 2016-07-06 DIAGNOSIS — I482 Chronic atrial fibrillation, unspecified: Secondary | ICD-10-CM

## 2016-07-06 DIAGNOSIS — I272 Pulmonary hypertension, unspecified: Secondary | ICD-10-CM

## 2016-07-06 DIAGNOSIS — I351 Nonrheumatic aortic (valve) insufficiency: Secondary | ICD-10-CM

## 2016-07-06 LAB — BASIC METABOLIC PANEL
BUN/Creatinine Ratio: 17 (ref 10–24)
BUN: 17 mg/dL (ref 8–27)
CO2: 27 mmol/L (ref 18–29)
CREATININE: 1 mg/dL (ref 0.76–1.27)
Calcium: 9.5 mg/dL (ref 8.6–10.2)
Chloride: 100 mmol/L (ref 96–106)
GFR, EST AFRICAN AMERICAN: 78 mL/min/{1.73_m2} (ref >59)
GFR, EST NON AFRICAN AMERICAN: 67 mL/min/{1.73_m2} (ref >59)
Glucose: 94 mg/dL (ref 65–99)
Potassium: 4.5 mmol/L (ref 3.5–5.2)
Sodium: 143 mmol/L (ref 134–144)

## 2016-07-06 LAB — CBC
HEMATOCRIT: 39.1 % (ref 37.5–51.0)
Hemoglobin: 13.2 g/dL (ref 13.0–17.7)
MCH: 32 pg (ref 26.6–33.0)
MCHC: 33.8 g/dL (ref 31.5–35.7)
MCV: 95 fL (ref 79–97)
PLATELETS: 194 10*3/uL (ref 150–379)
RBC: 4.13 x10E6/uL — ABNORMAL LOW (ref 4.14–5.80)
RDW: 13.7 % (ref 12.3–15.4)
WBC: 8.3 10*3/uL (ref 3.4–10.8)

## 2016-07-06 MED ORDER — FUROSEMIDE 20 MG PO TABS: 20.0000 mg | ORAL_TABLET | Freq: Every day | ORAL | 3 refills | Status: DC

## 2016-07-06 NOTE — Patient Instructions (Signed)
Medication Instructions:  DECREASE Lasix (Furosemide) to 20 mg once daily   Labwork: TODAY - CBC, BMET   Testing/Procedures: None Ordered   Follow-Up: Your physician wants you to follow-up in: July 2018. You will receive a reminder letter in the mail two months in advance. If you don't receive a letter, please call our office to schedule the follow-up appointment.   If you need a refill on your cardiac medications before your next appointment, please call your pharmacy.   Thank you for choosing [facility]! Christen Bame, RN [phone]

## 2016-07-06 NOTE — Progress Notes (Signed)
Cardiology Office Note   Date:  07/06/2016   ID:  Jeremy Kelley, DOB 09-Mar-1927, MRN [id number]  PCP:  [name], NP  Cardiologist:   Mertie Moores, MD   Chief Complaint  Patient presents with  . Follow-up    atrial fib, AI    Problem List 1. Atrial Fib  2. Hyperlipidemia 3. [place] 4. Carotid artery disease - 40-50% bilaterally    History of Present Illness: Jeremy Kelley, 2017: Jeremy Kelley is a 83 y.o. male who presents for further evaluation of atrial fib. He has noticed some fatigue and DOE for the past several months . Has DOE with very mild exertion  - ie walking [place] at the airport.   Denies any chest pain Retired from [employer].    Spends lots of time in [place]. [place]. Spends winters in [place] , [place] in [place] .   Jeremy 13, 2017:  Jeremy Kelley is seen back today for follow up visit . He presented with progressive DOE.  Echo showed:  Low normal LVF with EF 50-55%. Atrial fibrillation present.  Severe biatrial enlargement. Moderately dilated LV and moderately  reduced RVF. Moderate TR and PR and moderate pulmonary HTN with  PASP 71mmHg. Moderate diffuse AV thickening with moderate AR. The  right ventricular systolic pressure was increased consistent with  moderate pulmonary hypertension.   He was started on Lasix and potassium at his last office visit. His breathing is better but he still has significant fatigue  Has pain in the left side of his neck and intrascapular pain with walking  No CP .   Some shortness of breath - not much   Jeremy 21, 2017: Jeremy Kelley is seen today  TEE showed moderate AI, mild MR, moderate PI,  Markedly dilated RA  - Left ventricle: There was mild concentric hypertrophy. Systolic  function was normal. The estimated ejection fraction was in the  range of 50% to 55%. - Aortic valve: Trileaflet. sclerotic tips. There is moderate  central AI. - Aorta: Non-dilated. Mild atheromatous disease. - Mitral  valve: There was mild regurgitation. - Left atrium: Severely dilated. No evidence of thrombus in the  atrial cavity or appendage. Large chickenwing appendage- measures  36 mm x 48 mm in the 45 degree view. - Right atrium: Severely dilated. - Atrial septum: No defect or patent foramen ovale was identified. - Pulmonic valve: Moderate regurgitation. - Pulmonary arteries: Dilated.  Cath showed minimal CAD , mod AI,    He has persistent fatigue,  No dyspnea  Aug 28, 2015:  Breathing is ok Feeling progressively better.    Will be leaving to [place] soon ( 4 days )  BP is better  Has some occasional dizziness,  [place] off his bike 10 days ago.   Dizziness is unexpected, not orthostatic  Has these dizzy episodes once a week.  Episodes only for a split second   [name], 2017:  Mr Jeremy Kelley is see today for follow up of his atrial fib, moderate AI , and moderate Pulmonary HTN.  Is having lots of back pain.   Has talked to his primary MD Seems to be related to fatigue .  He had fatigue with walking and developed back pain while walking downtown. Not a ripping or tearing sensation.   Is high up in his shoulders  Will refer him to Bjorn Loser, PT.   July 06, 2016:  Jeremy Kelley is seen back today for follow up visit for his AI, atrial fib,  pulmonary HTN.   Not doing as well today  Has increase fatigue, increased sleepiness.   Increased disorientation   Has extensive / progressive fatigue with walking several blocks. This is typicaly a problem for him when he is down in [place]. Not as much a problem here in the [place]   He also wanted to review his meds.  Complains of increased frequency of urination with the urination . He held the lasix for 3 days and noticed that his feet were swelling.  He did not have any worsening dyspnea during this 3 day period.    He restarted the lasix the 20 mg a day  This helped the leg edema and did not cause as much urinary frequency ( has been on this dose  for 2 days )   Past Medical History:  Diagnosis Date  . Arthritis   . Carotid artery stenosis 11/2008   bilateral 40-59% stenosis  . Colon polyps   . Diverticulosis   . Hepatitis    doesn't know which type 1969  . Hyperlipidemia   . Hypertension   . Numbness    fingertips  . Phimosis   . Prostate cancer ([facility]) 2001  . Urothelial carcinoma of left distal ureter [facility])     Past Surgical History:  Procedure Laterality Date  . BIOPSY  10/01/2011   Procedure: BIOPSY;  Surgeon: [name], MD;  Location: [facility];  Service: Urology;;  biopsy of bladder tumor  . BLADDER SUSPENSION  Kelley/8/12   [name]  . CARDIAC CATHETERIZATION N/A 07/29/2015   Procedure: Right/Left Heart Cath and Coronary Angiography;  Surgeon: Peter M Martinique, MD;  Location: [facility];  Service: Cardiovascular;  Laterality: N/A;  . CATARACT EXTRACTION W/ INTRAOCULAR LENS  IMPLANT, BILATERAL Bilateral   . CYSTOSCOPY W/ RETROGRADES  01/27/2012   Procedure: CYSTOSCOPY WITH RETROGRADE PYELOGRAM;  Surgeon: [name], MD;  Location: [facility];  Service: Urology;  Laterality: Left;  . CYSTOSCOPY WITH RETROGRADE PYELOGRAM, URETEROSCOPY AND STENT PLACEMENT Left 11/24/2012   Procedure: CYSTOSCOPY WITH left RETROGRADE PYELOGRAM, bladder washings, ureteroscopy;  Surgeon: [name], MD;  Location: [facility];  Service: Urology;  Laterality: Left;  . CYSTOSCOPY WITH RETROGRADE PYELOGRAM, URETEROSCOPY AND STENT PLACEMENT Bilateral 11/19/2013   Procedure: CYSTOSCOPY WITH BILATERAL RETROGRADE PYELOGRAM,;  Surgeon: [name], MD;  Location: [facility];  Service: Urology;  Laterality: Bilateral;  . EYE SURGERY  09/24/10   left eye. "Retina surgery"  . INGUINAL HERNIA REPAIR  1960  . PROSTATE SURGERY     s/p laparoscopic prostate surgery  . SKIN CANCER EXCISION     a/p skin cancer right foot-non melanoma  . TEE WITHOUT CARDIOVERSION N/A 07/29/2015   Procedure: TRANSESOPHAGEAL ECHOCARDIOGRAM (TEE);  Surgeon: Pixie Casino, MD;   Location[facility];  Service: Cardiovascular;  Laterality: N/A;  cath to follow  . TRANSURETHRAL RESECTION OF BLADDER TUMOR  10/01/2011   Procedure: TRANSURETHRAL RESECTION OF BLADDER TUMOR (TURBT);  Surgeon: [name], MD;  Location: [facility];  Service: Urology;  Laterality: N/A;  . TRANSURETHRAL RESECTION OF BLADDER TUMOR N/A 11/19/2013   Procedure: TRANSURETHRAL RESECTION OF BLADDER TUMOR (TURBT);  Surgeon: [name], MD;  Location: [facility];  Service: Urology;  Laterality: N/A;  . URETEROSCOPY  01/27/2012   Procedure: URETEROSCOPY;  Surgeon: [name], MD;  Location: [facility];  Service: Urology;  Laterality: Left;  CYSTO, Left RETROGRADE PYELOGRPHY, LEFT URETEROSCOPY   . URINARY SPHINCTER IMPLANT    . VARICOSE VEIN SURGERY  Current Outpatient Prescriptions  Medication Sig Dispense Refill  . amLODipine (NORVASC) 2.Kelley MG tablet Take 1 tablet (2.Kelley mg total) by mouth daily. 90 tablet 3  . apixaban (ELIQUIS) 2.Kelley MG TABS tablet Take 1 tablet (2.Kelley mg total) by mouth 2 (two) times daily. 180 tablet 0  . B Complex-C (B-COMPLEX WITH VITAMIN C) tablet Take 1 tablet by mouth daily. Reported on 07/24/2015    . cholecalciferol (VITAMIN D) 1000 units tablet Take 1,000 Units by mouth daily.    . furosemide (LASIX) 40 MG tablet TAKE 1 TABLET (40 MG TOTAL) BY MOUTH DAILY. (Patient taking differently: Take 20 mg by mouth daily. TAKE 1 TABLET (40 MG TOTAL) BY MOUTH DAILY.) 90 tablet 0  . KLOR-CON M10 10 MEQ tablet     . methylcellulose (ARTIFICIAL TEARS) 1 % ophthalmic solution Place 1 drop into both eyes 2 (two) times daily.     . metoprolol succinate (TOPROL-XL) 25 MG 24 hr tablet Take 1 tablet (25 mg total) by mouth at bedtime. 90 tablet 0  . mirabegron ER (MYRBETRIQ) 50 MG TB24 tablet Take 1 tablet (50 mg total) by mouth daily. 90 tablet 3  . Multiple Vitamins-Minerals (PRESERVISION/LUTEIN PO) Take 1 tablet by mouth daily.    . pravastatin (PRAVACHOL) 20 MG tablet Take 1 tablet (20 mg total) by  mouth at bedtime. 90 tablet 3   No current facility-administered medications for this visit.     Allergies:   Patient has no known allergies.    Social History:  The patient  reports that he is a non-smoker but has been exposed to tobacco smoke. He has never used smokeless tobacco. He reports that he drinks about 8.4 oz of alcohol per week . He reports that he does not use drugs.   Family History:  The patient's family history is not on file.    ROS:  Please see the history of present illness.    Review of Systems: Constitutional:  denies fever, chills, diaphoresis, appetite change and fatigue.  HEENT: denies photophobia, eye pain, redness, hearing loss, ear pain, congestion, sore throat, rhinorrhea, sneezing, neck pain, neck stiffness and tinnitus.  Respiratory: denies SOB, DOE, cough, chest tightness, and wheezing.  Cardiovascular: denies chest pain, palpitations and leg swelling.  Gastrointestinal: denies nausea, vomiting, abdominal pain, diarrhea, constipation, blood in stool.  Genitourinary: denies dysuria, urgency, frequency, hematuria, flank pain and difficulty urinating.  Musculoskeletal: denies  myalgias, back pain, joint swelling, arthralgias and gait problem.   Skin: denies pallor, rash and wound.  Neurological: denies dizziness, seizures, syncope, weakness, light-headedness, numbness and headaches.   Hematological: denies adenopathy, easy bruising, personal or family bleeding history.  Psychiatric/ Behavioral: denies suicidal ideation, mood changes, confusion, nervousness, sleep disturbance and agitation.       All other systems are reviewed and negative.    PHYSICAL EXAM: VS:  BP 136/66 (BP Location: Left Arm, Patient Position: Sitting, Cuff Size: Normal)   Pulse 64   Ht Kelley\' 10"  (1.778 m)   Wt 133 lb 12.8 oz (60.7 kg)   SpO2 98%   BMI 19.20 kg/m  , BMI Body mass index is 19.2 kg/m. GEN: Well nourished, well developed, in no acute distress  HEENT: normal    Neck: no JVD, carotid bruits, or masses Cardiac: irreg Irreg. ;  Soft systolic murmur,  4-0/1 diastolic murmur c/w AI . no rubs, or gallops,no edema  Respiratory:  clear to auscultation bilaterally, normal work of breathing GI: soft, nontender, nondistended, + BS MS: no deformity  or atrophy , cath site looks great  Skin: warm and dry, no rash Neuro:  Strength and sensation are intact Psych: normal   EKG:  EKG is ordered today. July 06, 2016:   Atrial fib.   At 49.  Inc. RBBB.   Recent Labs: 12/12/2015: ALT 13 06/16/2016: BUN 21; Creatinine, Ser 1.05; Hemoglobin 13.1; Platelets 203.0; Potassium 4.4; Sodium 142; TSH 0.79    Lipid Panel    Component Value Date/Time   CHOL 135 09/14/2013 0852   TRIG 62.0 09/14/2013 0852   HDL 47.00 09/14/2013 0852   CHOLHDL 3 09/14/2013 0852   VLDL 12.4 09/14/2013 0852   LDLCALC 76 09/14/2013 0852      Wt Readings from Last 3 Encounters:  07/06/16 133 lb 12.8 oz (60.7 kg)  06/29/16 131 lb 3.2 oz (59.Kelley kg)  06/15/16 128 lb 12.8 oz (58.4 kg)      Other studies Reviewed: Additional studies/ records that were reviewed today include: . Review of the above records demonstrates:   Preliminary Echo results - LV function is low normal -  45-50% Moderate - severe AI Mild - mod MR Moderate TR with moderate pulmonary HTN Markedly enlarged right atrium. Moderately enlarged left atrium. At least moderate pulmonic valve insufficiency   ASSESSMENT AND PLAN:  1.  Atrial fib - his HR is very well controlled.   This may be contributing to his recent worsening symptoms of fatigue.   He has marked bi-atrial enlargement so I doubt cardioversion will be successful .   2. Aortic insufficiency -   .  stable for now Not severe Enough to consider doing TAVR .    3. Pulmonary HTN:   At least moderate .  Continue lasix and amlodipine  This is likely due to his AI    4. Mitral regurgitation -   Mild - moderate  Kelley. CAD - has only mild CAD by  cath. Will check a BMP today     6. Generalized fatigue:   I suspect this is mostly due to his age with  valvular disease and atrial fib. At this point, there is no procedure that we can recommend that would give him more energy.  Continue with current meds.     Current medicines are reviewed at length with the patient today.  The patient does not have concerns regarding medicines.  The following changes have been made:  no change  Labs/ tests ordered today include:   No orders of the defined types were placed in this encounter.   Disposition:     Mertie Moores, MD  07/06/2016 10:00 AM    [address], [place], [place]  [address] Phone: [phone]; Fax: [phone]

## 2016-07-07 ENCOUNTER — Ambulatory Visit: Payer: Medicare Other | Attending: Adult Health | Admitting: Physical Therapy

## 2016-07-07 ENCOUNTER — Encounter: Payer: Self-pay | Admitting: Physical Therapy

## 2016-07-07 DIAGNOSIS — R293 Abnormal posture: Secondary | ICD-10-CM | POA: Insufficient documentation

## 2016-07-07 DIAGNOSIS — G8929 Other chronic pain: Secondary | ICD-10-CM | POA: Diagnosis not present

## 2016-07-07 DIAGNOSIS — M6281 Muscle weakness (generalized): Secondary | ICD-10-CM

## 2016-07-07 DIAGNOSIS — R2689 Other abnormalities of gait and mobility: Secondary | ICD-10-CM | POA: Insufficient documentation

## 2016-07-07 DIAGNOSIS — M545 Low back pain, unspecified: Secondary | ICD-10-CM

## 2016-07-07 NOTE — Patient Instructions (Addendum)
On Elbows (Prone)    Rise up on elbows as high as possible, keeping hips on floor. Hold _30___ seconds. Repeat __2__ times per set. Do __1__ sets per session. Do _1___ sessions per day.  http://orth.exer.us/92   Copyright  VHI. All rights reserved.    Strengthening: Hip Extension (Prone)   Put 1-2 pillows under hips. Tighten muscles on front of left thigh, then lift leg __2__ inches from surface, keeping knee locked. Repeat __10__ times per set.on each leg. Do __1__ sets per session. Do __1__ sessions per day.  http://orth.exer.us/620   Copyright  VHI. All rights reserved.  Piriformis Stretch, Sitting    Sit, one ankle on opposite knee, same-side hand on crossed knee. Push down on knee, keeping spine straight. Lean torso forward, with flat back, until tension is felt in hamstrings and gluteals of crossed-leg side. Hold _30__ seconds.  Repeat __2_ times per session. Do _1__ sessions per day.  Copyright  VHI. All rights reserved.  Waist Lengthener    Feel top of pelvis (hipbones) all the way around and bottom of ribs all the way around. Lengthen space between them by lifting ribs up and away from pelvis. Repeat frequently during the day. Do every hour.  Like standing at attention  Copyright  VHI. All rights reserved.  [facility] [address], [place],  [address] Phone # [phone] Fax [phone]

## 2016-07-07 NOTE — Therapy (Signed)
[facility] [address] [place], [place], [address] Phone: [phone]   Fax:  [phone]  Physical Therapy Evaluation  Patient Details  Name: Jeremy Kelley MRN: [id number] Date of Birth: 1927-02-14 Referring Provider: Dr. [name]  Encounter Date: 07/07/2016      PT End of Session - 07/07/16 1315    Visit Number 1   Number of Visits 10   Date for PT Re-Evaluation 09/01/16   Authorization Type medicare g-code 10th visit; Kx modifier 15th   PT Start Time 1230   PT Stop Time 1315   PT Time Calculation (min) 45 min   Activity Tolerance Patient tolerated treatment well   Behavior During Therapy [facility] for tasks assessed/performed      Past Medical History:  Diagnosis Date  . Arthritis   . Carotid artery stenosis 11/2008   bilateral 40-59% stenosis  . Colon polyps   . Diverticulosis   . Hepatitis    doesn't know which type 1969  . Hyperlipidemia   . Hypertension   . Numbness    fingertips  . Phimosis   . Prostate cancer ([place]) 2001  . Urothelial carcinoma of left distal ureter [facility])     Past Surgical History:  Procedure Laterality Date  . BIOPSY  10/01/2011   Procedure: BIOPSY;  Surgeon: [name], MD;  [facility];  Service: Urology;;  biopsy of bladder tumor  . BLADDER SUSPENSION  08/18/10   [name]  . CARDIAC CATHETERIZATION N/A 07/29/2015   Procedure: Right/Left Heart Cath and Coronary Angiography;  Surgeon: Peter M Martinique, MD;  Location: [place] CV LAB;  Service: Cardiovascular;  Laterality: N/A;  . CATARACT EXTRACTION W/ INTRAOCULAR LENS  IMPLANT, BILATERAL Bilateral   . CYSTOSCOPY W/ RETROGRADES  01/27/2012   Procedure: CYSTOSCOPY WITH RETROGRADE PYELOGRAM;  Surgeon: [name], MD;  [facility];  Service: Urology;  Laterality: Left;  . CYSTOSCOPY WITH RETROGRADE PYELOGRAM, URETEROSCOPY AND STENT PLACEMENT Left 11/24/2012   Procedure: CYSTOSCOPY WITH left RETROGRADE  PYELOGRAM, bladder washings, ureteroscopy;  Surgeon: [name], MD;  [facility];  Service: Urology;  Laterality: Left;  . CYSTOSCOPY WITH RETROGRADE PYELOGRAM, URETEROSCOPY AND STENT PLACEMENT Bilateral 11/19/2013   Procedure: CYSTOSCOPY WITH BILATERAL RETROGRADE PYELOGRAM,;  Surgeon: [name], MD;  [facility];  Service: Urology;  Laterality: Bilateral;  . EYE SURGERY  09/24/10   left eye. "Retina surgery"  . INGUINAL HERNIA REPAIR  1960  . PROSTATE SURGERY     s/p laparoscopic prostate surgery  . SKIN CANCER EXCISION     a/p skin cancer right foot-non melanoma  . TEE WITHOUT CARDIOVERSION N/A 07/29/2015   Procedure: TRANSESOPHAGEAL ECHOCARDIOGRAM (TEE);  Surgeon: Pixie Casino, MD;  Location: [facility] ENDOSCOPY;  Service: Cardiovascular;  Laterality: N/A;  cath to follow  . TRANSURETHRAL RESECTION OF BLADDER TUMOR  10/01/2011   Procedure: TRANSURETHRAL RESECTION OF BLADDER TUMOR (TURBT);  Surgeon: [name], MD;  [facility];  Service: Urology;  Laterality: N/A;  . TRANSURETHRAL RESECTION OF BLADDER TUMOR N/A 11/19/2013   Procedure: TRANSURETHRAL RESECTION OF BLADDER TUMOR (TURBT);  Surgeon: [name], MD;  [facility];  Service: Urology;  Laterality: N/A;  . URETEROSCOPY  01/27/2012   Procedure: URETEROSCOPY;  Surgeon: [name], MD;  [facility];  Service: Urology;  Laterality: Left;  CYSTO, Left RETROGRADE PYELOGRPHY, LEFT URETEROSCOPY   . URINARY SPHINCTER IMPLANT    . VARICOSE VEIN SURGERY      There were no  vitals filed for this visit.       Subjective Assessment - 07/07/16 1238    Subjective 1 year ago patient slipped in the bathtub. Patient complains of back pain.  Wants to return to mild exercise include kayak, walking   How long can you walk comfortably? increases pain   Patient Stated Goals reduce back pain to return to walking and kayaking   Currently in Pain? Yes   Pain Score 2    Pain Location Back   Pain Orientation Upper;Mid;Lower    Pain Descriptors / Indicators Discomfort   Pain Type Chronic pain   Pain Onset More than a month ago   Pain Frequency Intermittent   Aggravating Factors  walking,    Pain Relieving Factors sitting   Effect of Pain on Daily Activities walking            [facility] PT Assessment - 07/07/16 0001      Assessment   Medical Diagnosis R26.81 Gait instability   Referring Provider Dr. [name]   Onset Date/Surgical Date 07/08/15   Prior Therapy none     Precautions   Precautions Other (comment)   Precaution Comments cancer     Restrictions   Weight Bearing Restrictions No     Balance Screen   Has the patient fallen in the past 6 months No   Has the patient had a decrease in activity level because of a fear of falling?  No   Is the patient reluctant to leave their home because of a fear of falling?  No     Home Environment   Living Environment Private residence   Living Arrangements Alone  travels back and forth to [place]     Prior Function   Level of [place] Retired   Leisure walking, Nurse, mental health   Overall Cognitive Status Within Functional Limits for tasks assessed     Observation/Other Assessments   Focus on Therapeutic Outcomes (FOTO)  Therapist discretion with back pain and gait deficits with 40% limitation  goal is 25% limitation     Posture/Postural Control   Posture/Postural Control Postural limitations   Postural Limitations Increased thoracic kyphosis;Decreased lumbar lordosis;Forward head;Rounded Shoulders  very flexed trunk   Posture Comments flexed posture happened 1-2 years agos; patient can come to neurtral     ROM / Strength   AROM / PROM / Strength AROM;Strength;PROM     AROM   AROM Assessment Site Lumbar   Lumbar Flexion full   Lumbar Extension to neutral   Lumbar - Right Side Bend decreased by 25%   Lumbar - Left Side Bend decreased by 25%     PROM   Right Hip External Rotation  50   Left Hip  External Rotation  35     Strength   Right/Left Hip Right;Left   Right Hip Flexion 4/5   Right Hip Extension 3/5   Right Hip ABduction 3/5   Right Hip ADduction 3/5   Left Hip Flexion 4/5   Left Hip Extension 3/5   Left Hip ABduction 3/5   Right/Left Knee Right;Left   Right Knee Flexion 4/5   Right Knee Extension 4/5   Left Knee Flexion 4/5   Left Knee Extension 4/5     Flexibility   Soft Tissue Assessment /Muscle Length yes   Hamstrings tight bilaterally   Quadriceps tight bilaterally   Piriformis tight bilaterally     Palpation   Spinal mobility decreased mobility  of T6-L5   Palpation comment P-A movement of L1-L5 felt good     Transfers   Transfers Not assessed     Ambulation/Gait   Ambulation/Gait Yes   Assistive device None   Gait Pattern Decreased arm swing - right;Decreased arm swing - left;Decreased stride length;Trunk flexed  decreased bilateral hip extension                   [facility] Adult PT Treatment/Exercise - 07/07/16 0001      Therapeutic Activites    Therapeutic Activities Other Therapeutic Activities     Neuro Re-ed    Neuro Re-ed Details  education on correct posture in standing and elongating his abdominals                PT Education - 07/07/16 1314    Education provided Yes   Education Details posture; hip extension in prone, piriformis stretch   Person(s) Educated Patient   Methods Explanation;Demonstration;Verbal cues;Handout   Comprehension Returned demonstration;Verbalized understanding          PT Short Term Goals - 07/07/16 1503      PT SHORT TERM GOAL #1   Title independent with initial home exercise program for flexibility and postural strength   Time 4   Period Weeks   Status New     PT SHORT TERM GOAL #2   Title ambulate for 30 minutes with upright posture and pain decreased >/= 25%   Time 4   Period Weeks   Status New           PT Long Term Goals - 07/07/16 1504      PT LONG TERM GOAL #1    Title understand how to exercise at the gym with correct body mechanics and progress himself   Time 8   Period Weeks   Status New     PT LONG TERM GOAL #2   Title ability to walk for 45 minutes with back pain decreased >/= 75% due to improve posture and strength   Time 8   Period Weeks   Status New     PT LONG TERM GOAL #3   Title ability to walk with full bilateral hip extension due to increased strength >/= 4+/5   Time 8   Period Weeks   Status New     PT LONG TERM GOAL #4   Title lumbar extension decreased >/= 25% so he is able to stand erect with minimal to no difficulty   Time 8   Period Weeks   Status New               Plan - 07/07/16 1456    Clinical Impression Statement Patient is a 83 year old male with back pain, posture and gait deficits.  Patient reports his back pain is 2/10 intermittently and worse with walking.  Patient walks with flexed posture, decreased bil. hip extension, small steps and forward head that will strain his back.  Patient has weakness in bilateral lower extremity ranging from 3-4/5.  Patient has tight quadriceps, hamstring, and abdominals that will contribute to his poor posture.  Patient has stopped walking due to his back pain and now sitting more in the car.  When patient lives in [place], he walks everywhere getting more exercise.  Patient is a member of the gym and would like to know how to exercise properly.  Patient has decreased mobiliy of T6-L5.  Patient is able to correct his posture and get to  spinal neutral but unable to hold it.  Patient is low complexity due to evolving condition and heart condition as a comorbidity that could impact his care.  Patient will benefit from skilled therapy to improve his postural muslce strength and improve his gait with flexibility and strenght.    Rehab Potential Excellent   Clinical Impairments Affecting Rehab Potential History of prostate cancer   PT Frequency 2x / week   PT Duration 8 weeks   PT  Treatment/Interventions Cryotherapy;Electrical Stimulation;Gait training;Moist Heat;Therapeutic activities;Therapeutic exercise;Neuromuscular re-education;Patient/family education;Passive range of motion;Manual techniques;Dry needling;Taping   PT Next Visit Plan work on postural strength, hip and knee strength, standing posture, joint mobilization to spine   PT Home Exercise Plan progress as needed   Recommended Other Services None   Consulted and Agree with Plan of Care Patient      Patient will benefit from skilled therapeutic intervention in order to improve the following deficits and impairments:  Pain, Abnormal gait, Decreased mobility, Decreased strength, Postural dysfunction, Difficulty walking, Decreased activity tolerance  Visit Diagnosis: Muscle weakness (generalized) - Plan: PT plan of care cert/re-cert  Abnormal posture - Plan: PT plan of care cert/re-cert  Other abnormalities of gait and mobility - Plan: PT plan of care cert/re-cert  Chronic bilateral low back pain without sciatica - Plan: PT plan of care cert/re-cert      G-Codes - 19/14/78 1455    Functional Assessment Tool Used (Outpatient Only) therapist discrection is 40% limitation due to postural and gait deficits  goal is 25% limitation   Functional Limitation Mobility: Walking and moving around   Mobility: Walking and Moving Around Current Status (G9562) At least 40 percent but less than 60 percent impaired, limited or restricted   Mobility: Walking and Moving Around Goal Status (Z3086) At least 20 percent but less than 40 percent impaired, limited or restricted       Problem List Patient Active Problem List   Diagnosis Date Noted  . Fatigue 11/04/2015  . CAD (coronary artery disease) 08/01/2015  . Pulmonary hypertension 07/24/2015  . Atrial fibrillation ([place]) 07/16/2015  . Aortic insufficiency 01/04/2014  . Squamous cell carcinoma in situ of skin of right lower leg 11/13/2013  . Lentigo maligna of cheek  ([place]) 09/19/2013  . Actinic keratosis 11/06/2012  . Macular degeneration 08/10/2012  . Allergic rhinitis 08/10/2012  . Bladder cancer ([place]) 10/19/2011  . Eczema 02/23/2011  . Hyperglycemia 02/11/2011  . Heart murmur 09/10/2010  . ABNORMAL THYROID FUNCTION TESTS 08/05/2009  . ABNORMAL HEART RHYTHMS 07/11/2009  . HYPERLIPIDEMIA 11/28/2008  . CAROTID BRUIT 11/19/2008  . UNSPECIFIED CATARACT 10/17/2008  . PARESTHESIA 12/04/2007  . Varicose veins of bilateral lower extremities with other complications 57/84/6962  . COLONIC POLYPS, HX OF 08/28/2007  . Essential hypertension 02/08/2007  . PROSTATE CANCER, HX OF 11/04/2006  . INGUINAL HERNIA, HX OF 11/04/2006    Earlie Counts, PT 07/07/16 3:09 PM    [facility] [address] [place], [place], [address] Phone: [phone]   Fax:  [phone]  Name: Jeremy Kelley MRN: [id number] Date of Birth: 1926-11-28

## 2016-07-08 ENCOUNTER — Encounter: Payer: Self-pay | Admitting: Adult Health

## 2016-07-08 ENCOUNTER — Ambulatory Visit (INDEPENDENT_AMBULATORY_CARE_PROVIDER_SITE_OTHER): Payer: Medicare Other | Admitting: Adult Health

## 2016-07-08 VITALS — BP 122/74 | Temp 98.2°F | Wt 134.0 lb

## 2016-07-08 DIAGNOSIS — R350 Frequency of micturition: Secondary | ICD-10-CM

## 2016-07-08 DIAGNOSIS — R5382 Chronic fatigue, unspecified: Secondary | ICD-10-CM

## 2016-07-08 NOTE — Progress Notes (Signed)
Subjective:    Patient ID: Jeremy Kelley, male    DOB: 1926-11-16, 83 y.o.   MRN: [id number]  HPI 83 year old male who  has a past medical history of Arthritis; Carotid artery stenosis (11/2008); Colon polyps; Diverticulosis; Hepatitis; Hyperlipidemia; Hypertension; Numbness; Phimosis; Prostate cancer ([place]) (2001); and Urothelial carcinoma of left distal ureter ([place]).   He presents for follow-up today. His last visit one of his major complaint is that of increased frequency of urination, his current dose of Lasix at that time was 40 mg. I had him cut back to 20 mg see if this would help. He reports that for approximately 3 days last week he stopped taking Lasix altogether and noticed that his feet are swelling. He did not have any worsening shortness of breath during this timeframe. Since then he started 20 mg and endorses this seems to be working well for him. His feet are normal longer swelling and heat is not urinating as frequently.  He has also recently started physical therapy strengthening exercises as well as back and neck pain. He has gone on time. He is hopeful that this is going to help with his chronic back pain issues.  He continues to have progressive fatigue especially with exertion such as walking    Review of Systems See HPI   Past Medical History:  Diagnosis Date  . Arthritis   . Carotid artery stenosis 11/2008   bilateral 40-59% stenosis  . Colon polyps   . Diverticulosis   . Hepatitis    doesn't know which type 1969  . Hyperlipidemia   . Hypertension   . Numbness    fingertips  . Phimosis   . Prostate cancer ([place]) 2001  . Urothelial carcinoma of left distal ureter [facility])     Social History   Social History  . Marital status: Single    Spouse name: N/A  . Number of children: 0  . Years of education: N/A   Occupational History  . Retired    Social History Main Topics  . Smoking status: Passive Smoke Exposure - Never Smoker    Types: Cigars  .  Smokeless tobacco: Never Used     Comment: 07/29/2015 "might smoke a cigar once/year"  . Alcohol use 8.4 oz/week    14 Glasses of wine per week     Comment: 07/29/2015 "big glass of wine q night"  . Drug use: No  . Sexual activity: Not on file   Other Topics Concern  . Not on file   Social History Narrative   Patient is single, has never been married.  Does not have any children.  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   He drinks approximately 3-4 glasses of wine per week.  No history of excessive alcohol use.  Occasionally smokes a cigar.   Spends winters in [place]         Past Surgical History:  Procedure Laterality Date  . BIOPSY  10/01/2011   Procedure: BIOPSY;  Surgeon: [name], MD;  [facility];  Service: Urology;;  biopsy of bladder tumor  . BLADDER SUSPENSION  08/18/10   [name]  . CARDIAC CATHETERIZATION N/A 07/29/2015   Procedure: Right/Left Heart Cath and Coronary Angiography;  Surgeon: Peter M Martinique, MD;  Location: [place] CV LAB;  Service: Cardiovascular;  Laterality: N/A;  . CATARACT EXTRACTION W/ INTRAOCULAR LENS  IMPLANT, BILATERAL Bilateral   . CYSTOSCOPY W/ RETROGRADES  01/27/2012   Procedure: CYSTOSCOPY WITH  RETROGRADE PYELOGRAM;  Surgeon: [name], MD;  [facility];  Service: Urology;  Laterality: Left;  . CYSTOSCOPY WITH RETROGRADE PYELOGRAM, URETEROSCOPY AND STENT PLACEMENT Left 11/24/2012   Procedure: CYSTOSCOPY WITH left RETROGRADE PYELOGRAM, bladder washings, ureteroscopy;  Surgeon: [name], MD;  [facility];  Service: Urology;  Laterality: Left;  . CYSTOSCOPY WITH RETROGRADE PYELOGRAM, URETEROSCOPY AND STENT PLACEMENT Bilateral 11/19/2013   Procedure: CYSTOSCOPY WITH BILATERAL RETROGRADE PYELOGRAM,;  Surgeon: [name], MD;  [facility];  Service: Urology;  Laterality: Bilateral;  . EYE SURGERY  09/24/10   left eye. "Retina surgery"  . INGUINAL HERNIA REPAIR  1960  . PROSTATE SURGERY     s/p  laparoscopic prostate surgery  . SKIN CANCER EXCISION     a/p skin cancer right foot-non melanoma  . TEE WITHOUT CARDIOVERSION N/A 07/29/2015   Procedure: TRANSESOPHAGEAL ECHOCARDIOGRAM (TEE);  Surgeon: Pixie Casino, MD;  Location[facility] ENDOSCOPY;  Service: Cardiovascular;  Laterality: N/A;  cath to follow  . TRANSURETHRAL RESECTION OF BLADDER TUMOR  10/01/2011   Procedure: TRANSURETHRAL RESECTION OF BLADDER TUMOR (TURBT);  Surgeon: [name], MD;  [facility];  Service: Urology;  Laterality: N/A;  . TRANSURETHRAL RESECTION OF BLADDER TUMOR N/A 11/19/2013   Procedure: TRANSURETHRAL RESECTION OF BLADDER TUMOR (TURBT);  Surgeon: [name], MD;  [facility];  Service: Urology;  Laterality: N/A;  . URETEROSCOPY  01/27/2012   Procedure: URETEROSCOPY;  Surgeon: [name], MD;  [facility];  Service: Urology;  Laterality: Left;  CYSTO, Left RETROGRADE PYELOGRPHY, LEFT URETEROSCOPY   . URINARY SPHINCTER IMPLANT    . VARICOSE VEIN SURGERY      Family History  Problem Relation Age of Onset  . Colon cancer Neg Hx   . Colon polyps Neg Hx     No Known Allergies  Current Outpatient Prescriptions on File Prior to Visit  Medication Sig Dispense Refill  . amLODipine (NORVASC) 2.5 MG tablet Take 1 tablet (2.5 mg total) by mouth daily. 90 tablet 3  . apixaban (ELIQUIS) 2.5 MG TABS tablet Take 1 tablet (2.5 mg total) by mouth 2 (two) times daily. 180 tablet 0  . B Complex-C (B-COMPLEX WITH VITAMIN C) tablet Take 1 tablet by mouth daily. Reported on 07/24/2015    . cholecalciferol (VITAMIN D) 1000 units tablet Take 1,000 Units by mouth daily.    . furosemide (LASIX) 20 MG tablet Take 1 tablet (20 mg total) by mouth daily. 90 tablet 3  . KLOR-CON M10 10 MEQ tablet     . methylcellulose (ARTIFICIAL TEARS) 1 % ophthalmic solution Place 1 drop into both eyes 2 (two) times daily.     . metoprolol succinate (TOPROL-XL) 25 MG 24 hr tablet Take 1 tablet (25 mg total) by mouth at bedtime. 90  tablet 0  . mirabegron ER (MYRBETRIQ) 50 MG TB24 tablet Take 1 tablet (50 mg total) by mouth daily. 90 tablet 3  . Multiple Vitamins-Minerals (PRESERVISION/LUTEIN PO) Take 1 tablet by mouth daily.    . pravastatin (PRAVACHOL) 20 MG tablet Take 1 tablet (20 mg total) by mouth at bedtime. 90 tablet 3   No current facility-administered medications on file prior to visit.     BP 122/74 (BP Location: Left Arm, Patient Position: Standing, Cuff Size: Normal)   Temp 98.2 F (36.8 C) (Oral)   Wt 134 lb (60.8 kg)   BMI 19.23 kg/m       Objective:   Physical Exam  Constitutional: He is oriented to person,  place, and time. He appears well-developed and well-nourished. No distress.  Cardiovascular: Normal rate, regular rhythm and intact distal pulses.  Exam reveals no gallop and no friction rub.   Murmur heard. Pulmonary/Chest: Effort normal and breath sounds normal. No respiratory distress. He has no wheezes. He has no rales. He exhibits no tenderness.  Abdominal: Soft. Bowel sounds are normal. He exhibits no distension and no mass. There is no tenderness. There is no rebound and no guarding.  Musculoskeletal: Normal range of motion. He exhibits no edema, tenderness or deformity.  Neurological: He is alert and oriented to person, place, and time.  Skin: Skin is warm and dry. No rash noted. He is not diaphoretic. No erythema. No pallor.  Psychiatric: He has a normal mood and affect. His behavior is normal. Judgment and thought content normal.      Assessment & Plan:  1. Chronic fatigue - At a long discussion with Tyrion about his  disease process, especially his cardiac issues. I explained to him that I believe that his fatigue is related not only to age but also his cardiac problems. I do not see this getting any better will progressively get worse as he ages. I explained to him that it is okay for him to rest throughout the day especially when he is feeling more fatigued than normal. -No  changes in medication at this time  2. Urinary frequency - Seems to be better controlled while on Lasix 20 mg - No change in medications  - Follow up as needed  [name], NP

## 2016-07-13 ENCOUNTER — Ambulatory Visit: Payer: Medicare Other | Admitting: Adult Health

## 2016-07-13 DIAGNOSIS — M5134 Other intervertebral disc degeneration, thoracic region: Secondary | ICD-10-CM | POA: Diagnosis not present

## 2016-07-13 DIAGNOSIS — M5136 Other intervertebral disc degeneration, lumbar region: Secondary | ICD-10-CM | POA: Diagnosis not present

## 2016-07-13 DIAGNOSIS — M9902 Segmental and somatic dysfunction of thoracic region: Secondary | ICD-10-CM | POA: Diagnosis not present

## 2016-07-13 DIAGNOSIS — M9903 Segmental and somatic dysfunction of lumbar region: Secondary | ICD-10-CM | POA: Diagnosis not present

## 2016-07-14 ENCOUNTER — Ambulatory Visit: Payer: Medicare Other | Attending: Adult Health

## 2016-07-14 DIAGNOSIS — M545 Low back pain: Secondary | ICD-10-CM | POA: Insufficient documentation

## 2016-07-14 DIAGNOSIS — G8929 Other chronic pain: Secondary | ICD-10-CM | POA: Diagnosis not present

## 2016-07-14 DIAGNOSIS — R293 Abnormal posture: Secondary | ICD-10-CM | POA: Diagnosis not present

## 2016-07-14 DIAGNOSIS — R2689 Other abnormalities of gait and mobility: Secondary | ICD-10-CM | POA: Insufficient documentation

## 2016-07-14 DIAGNOSIS — M6281 Muscle weakness (generalized): Secondary | ICD-10-CM

## 2016-07-14 NOTE — Therapy (Signed)
[facility] [address] [place], [place], [address] Phone: [phone]   Fax:  [phone]  Physical Therapy Treatment  Patient Details  Name: Jeremy Kelley MRN: [id number] Date of Birth: 1926-06-06 Referring Provider: Dr. [name]  Encounter Date: 07/14/2016      PT End of Session - 07/14/16 1500    Visit Number 2   Number of Visits 10   Date for PT Re-Evaluation 09/01/16   Authorization Type medicare g-code 10th visit; Kx modifier 15th   PT Start Time 1449   PT Stop Time 1535   PT Time Calculation (min) 46 min   Activity Tolerance Patient tolerated treatment well   Behavior During Therapy [facility] for tasks assessed/performed      Past Medical History:  Diagnosis Date  . Arthritis   . Carotid artery stenosis 11/2008   bilateral 40-59% stenosis  . Colon polyps   . Diverticulosis   . Hepatitis    doesn't know which type 1969  . Hyperlipidemia   . Hypertension   . Numbness    fingertips  . Phimosis   . Prostate cancer ([place]) 2001  . Urothelial carcinoma of left distal ureter [facility])     Past Surgical History:  Procedure Laterality Date  . BIOPSY  10/01/2011   Procedure: BIOPSY;  Surgeon: [name], MD;  [facility]: [facility];  Service: Urology;;  biopsy of bladder tumor  . BLADDER SUSPENSION  08/18/10   [name]  . CARDIAC CATHETERIZATION N/A 07/29/2015   Procedure: Right/Left Heart Cath and Coronary Angiography;  Surgeon: Peter M Martinique, MD;  Location: [place] CV [facility];  Service: Cardiovascular;  Laterality: N/A;  . CATARACT EXTRACTION W/ INTRAOCULAR LENS  IMPLANT, BILATERAL Bilateral   . CYSTOSCOPY W/ RETROGRADES  01/27/2012   Procedure: CYSTOSCOPY WITH RETROGRADE PYELOGRAM;  Surgeon: [name], MD;  [facility];  Service: Urology;  Laterality: Left;  . CYSTOSCOPY WITH RETROGRADE PYELOGRAM, URETEROSCOPY AND STENT PLACEMENT Left 11/24/2012   Procedure: CYSTOSCOPY WITH left RETROGRADE PYELOGRAM,  bladder washings, ureteroscopy;  Surgeon: [name], MD;  [facility];  Service: Urology;  Laterality: Left;  . CYSTOSCOPY WITH RETROGRADE PYELOGRAM, URETEROSCOPY AND STENT PLACEMENT Bilateral 11/19/2013   Procedure: CYSTOSCOPY WITH BILATERAL RETROGRADE PYELOGRAM,;  Surgeon: [name], MD;  [facility];  Service: Urology;  Laterality: Bilateral;  . EYE SURGERY  09/24/10   left eye. "Retina surgery"  . INGUINAL HERNIA REPAIR  1960  . PROSTATE SURGERY     s/p laparoscopic prostate surgery  . SKIN CANCER EXCISION     a/p skin cancer right foot-non melanoma  . TEE WITHOUT CARDIOVERSION N/A 07/29/2015   Procedure: TRANSESOPHAGEAL ECHOCARDIOGRAM (TEE);  Surgeon: Pixie Casino, MD;  Location: [facility] ENDOSCOPY;  Service: Cardiovascular;  Laterality: N/A;  cath to follow  . TRANSURETHRAL RESECTION OF BLADDER TUMOR  10/01/2011   Procedure: TRANSURETHRAL RESECTION OF BLADDER TUMOR (TURBT);  Surgeon: [name], MD;  [facility];  Service: Urology;  Laterality: N/A;  . TRANSURETHRAL RESECTION OF BLADDER TUMOR N/A 11/19/2013   Procedure: TRANSURETHRAL RESECTION OF BLADDER TUMOR (TURBT);  Surgeon: [name], MD;  [facility];  Service: Urology;  Laterality: N/A;  . URETEROSCOPY  01/27/2012   Procedure: URETEROSCOPY;  Surgeon: [name], MD;  [facility];  Service: Urology;  Laterality: Left;  CYSTO, Left RETROGRADE PYELOGRPHY, LEFT URETEROSCOPY   . URINARY SPHINCTER IMPLANT    . VARICOSE VEIN SURGERY      There were no  vitals filed for this visit.      Subjective Assessment - 07/14/16 1453    Subjective Pt. reporting he has been performing HEP however would like to review these activities.     Patient Stated Goals reduce back pain to return to walking and kayaking   Currently in Pain? Yes   Pain Score 1    Pain Location Back   Pain Orientation Mid;Lower;Upper   Pain Descriptors / Indicators Discomfort   Pain Type Chronic pain   Pain Onset More than a month ago    Pain Frequency Intermittent   Aggravating Factors  walking   Pain Relieving Factors sitting    Multiple Pain Sites No                         [facility] Adult PT Treatment/Exercise - 07/14/16 1513      Exercises   Exercises Knee/Hip     Knee/Hip Exercises: Stretches   Piriformis Stretch Right;Left;1 rep;30 seconds   Piriformis Stretch Limitations Seated      Knee/Hip Exercises: Prone   Hip Extension AROM;Right;Left;Strengthening;1 set;10 reps   Hip Extension Limitations HEP review    Other Prone Exercises Prone press up 3" x 10 reps  HEP review     Shoulder Exercises: Supine   External Rotation AROM;15 reps;Strengthening   Other Supine Exercises Laying on pool noodle in cross chest stretch x 1 min; tolerated well      Shoulder Exercises: Standing   Extension AROM;Both;Strengthening;15 reps;Theraband   Theraband Level (Shoulder Extension) Level 2 (Red)   Extension Limitations tactile cues for scap. squeeze    Row AROM;Both;Strengthening;15 reps;Theraband   Theraband Level (Shoulder Row) Level 2 (Red)   Row Limitations Tactile cues for scap. squeeze    Other Standing Exercises Standing B shoulder extension with scap. squeeze with red TB x 15 reps  leaning on 6" bolster on wall    Other Standing Exercises Standing B shoulder ER with scap. squeeze with red TB x 15 reps    leaning on 6" bolster on wall     Shoulder Exercises: ROM/Strengthening   Cybex Row 15 reps   Cybex Row Limitations 15#; cues for scap. squeeze    Other ROM/Strengthening Exercises Machine pulldwown 15# x 15 reps  cues for scap. squeeze and shoulder depression      Shoulder Exercises: Stretch   Corner Stretch 3 reps;30 seconds   Corner Stretch Limitations Mult-angle doorway stretch                   PT Short Term Goals - 07/14/16 1600      PT SHORT TERM GOAL #1   Title independent with initial home exercise program for flexibility and postural strength   Time 4   Period Weeks    Status On-going     PT SHORT TERM GOAL #2   Title ambulate for 30 minutes with upright posture and pain decreased >/= 25%   Time 4   Period Weeks   Status On-going           PT Long Term Goals - 07/14/16 1601      PT LONG TERM GOAL #1   Title understand how to exercise at the gym with correct body mechanics and progress himself   Time 8   Period Weeks   Status On-going     PT LONG TERM GOAL #2   Title ability to walk for 45 minutes with back pain decreased >/=  75% due to improve posture and strength   Time 8   Period Weeks   Status On-going     PT LONG TERM GOAL #3   Title ability to walk with full bilateral hip extension due to increased strength >/= 4+/5   Time 8   Period Weeks   Status On-going     PT LONG TERM GOAL #4   Title lumbar extension decreased >/= 25% so he is able to stand erect with minimal to no difficulty   Time 8   Period Weeks   Status On-going               Plan - 07/14/16 1601    Clinical Impression Statement Pt. seen initially with HEP handout from last treatment requesting review of exercises.  Pt. with good technique with HEP today and seems very eager to perform activities, "the right way".  Pt. asking about machine strengthening activities he can perform at local gym.  Light resistance with machine pulldown and low row reviewed with pt. today.  Today's treatment focusing on scapular strengthening and gentle chest stretching to improve posture.  Pt. leaving therapy with report of pain relief at upper/mid back responding very well to scapular strengthening.   PT Treatment/Interventions Cryotherapy;Electrical Stimulation;Gait training;Moist Heat;Therapeutic activities;Therapeutic exercise;Neuromuscular re-education;Patient/family education;Passive range of motion;Manual techniques;Dry needling;Taping   PT Next Visit Plan Continue working on postural strength, hip and knee strength, standing posture, joint mobilization to spine       Patient will benefit from skilled therapeutic intervention in order to improve the following deficits and impairments:  Pain, Abnormal gait, Decreased mobility, Decreased strength, Postural dysfunction, Difficulty walking, Decreased activity tolerance  Visit Diagnosis: Muscle weakness (generalized)  Abnormal posture  Other abnormalities of gait and mobility  Chronic bilateral low back pain without sciatica     Problem List Patient Active Problem List   Diagnosis Date Noted  . Fatigue 11/04/2015  . CAD (coronary artery disease) 08/01/2015  . Pulmonary hypertension 07/24/2015  . Atrial fibrillation ([place]) 07/16/2015  . Aortic insufficiency 01/04/2014  . Squamous cell carcinoma in situ of skin of right lower leg 11/13/2013  . Lentigo maligna of cheek ([place]) 09/19/2013  . Actinic keratosis 11/06/2012  . Macular degeneration 08/10/2012  . Allergic rhinitis 08/10/2012  . Bladder cancer ([place]) 10/19/2011  . Eczema 02/23/2011  . Hyperglycemia 02/11/2011  . Heart murmur 09/10/2010  . ABNORMAL THYROID FUNCTION TESTS 08/05/2009  . ABNORMAL HEART RHYTHMS 07/11/2009  . HYPERLIPIDEMIA 11/28/2008  . CAROTID BRUIT 11/19/2008  . UNSPECIFIED CATARACT 10/17/2008  . PARESTHESIA 12/04/2007  . Varicose veins of bilateral lower extremities with other complications 30/16/0109  . COLONIC POLYPS, HX OF 08/28/2007  . Essential hypertension 02/08/2007  . PROSTATE CANCER, HX OF 11/04/2006  . INGUINAL HERNIA, HX OF 11/04/2006   Bess Harvest, PTA 07/14/16 4:09 PM   [facility] [address], [place], [place], [address] Phone: [phone]   Fax:  [phone]  Name: Jeremy Kelley MRN: [id number] Date of Birth: 04-Jun-1926

## 2016-07-16 ENCOUNTER — Ambulatory Visit: Payer: Medicare Other | Admitting: Rehabilitative and Restorative Service Providers"

## 2016-07-16 ENCOUNTER — Ambulatory Visit: Payer: Medicare Other | Admitting: Adult Health

## 2016-07-16 DIAGNOSIS — M545 Low back pain, unspecified: Secondary | ICD-10-CM

## 2016-07-16 DIAGNOSIS — G8929 Other chronic pain: Secondary | ICD-10-CM | POA: Diagnosis not present

## 2016-07-16 DIAGNOSIS — R2689 Other abnormalities of gait and mobility: Secondary | ICD-10-CM

## 2016-07-16 DIAGNOSIS — M6281 Muscle weakness (generalized): Secondary | ICD-10-CM | POA: Diagnosis not present

## 2016-07-16 DIAGNOSIS — R293 Abnormal posture: Secondary | ICD-10-CM | POA: Diagnosis not present

## 2016-07-16 NOTE — Therapy (Signed)
[facility] [address], [place], [place], [address] Phone: [phone]   Fax:  [phone]  Physical Therapy Treatment  Patient Details  Name: Jeremy Kelley MRN: [id number] Date of Birth: 1926-09-29 Referring Provider: Dr. [name]  Encounter Date: 07/16/2016      PT End of Session - 07/16/16 1156    Visit Number 3   Number of Visits 10   Date for PT Re-Evaluation 09/01/16   Authorization Type medicare g-code 10th visit; Kx modifier 15th   PT Start Time 1100   PT Stop Time 1149   PT Time Calculation (min) 49 min   Activity Tolerance Patient tolerated treatment well   Behavior During Therapy [facility] for tasks assessed/performed      Past Medical History:  Diagnosis Date  . Arthritis   . Carotid artery stenosis 11/2008   bilateral 40-59% stenosis  . Colon polyps   . Diverticulosis   . Hepatitis    doesn't know which type 1969  . Hyperlipidemia   . Hypertension   . Numbness    fingertips  . Phimosis   . Prostate cancer ([place]) 2001  . Urothelial carcinoma of left distal ureter [facility])     Past Surgical History:  Procedure Laterality Date  . BIOPSY  10/01/2011   Procedure: BIOPSY;  Surgeon: [name], MD;  [facility];  Service: Urology;;  biopsy of bladder tumor  . BLADDER SUSPENSION  08/18/10   [name]  . CARDIAC CATHETERIZATION N/A 07/29/2015   Procedure: Right/Left Heart Cath and Coronary Angiography;  Surgeon: Peter M Martinique, MD;  Location: [place] [facility];  Service: Cardiovascular;  Laterality: N/A;  . CATARACT EXTRACTION W/ INTRAOCULAR LENS  IMPLANT, BILATERAL Bilateral   . CYSTOSCOPY W/ RETROGRADES  01/27/2012   Procedure: CYSTOSCOPY WITH RETROGRADE PYELOGRAM;  Surgeon: [name], MD;  [facility];  Service: Urology;  Laterality: Left;  . CYSTOSCOPY WITH RETROGRADE PYELOGRAM, URETEROSCOPY AND STENT PLACEMENT Left 11/24/2012   Procedure: CYSTOSCOPY WITH left RETROGRADE PYELOGRAM,  bladder washings, ureteroscopy;  Surgeon: [name], MD;  [facility];  Service: Urology;  Laterality: Left;  . CYSTOSCOPY WITH RETROGRADE PYELOGRAM, URETEROSCOPY AND STENT PLACEMENT Bilateral 11/19/2013   Procedure: CYSTOSCOPY WITH BILATERAL RETROGRADE PYELOGRAM,;  Surgeon: [name], MD;  [facility];  Service: Urology;  Laterality: Bilateral;  . EYE SURGERY  09/24/10   left eye. "Retina surgery"  . INGUINAL HERNIA REPAIR  1960  . PROSTATE SURGERY     s/p laparoscopic prostate surgery  . SKIN CANCER EXCISION     a/p skin cancer right foot-non melanoma  . TEE WITHOUT CARDIOVERSION N/A 07/29/2015   Procedure: TRANSESOPHAGEAL ECHOCARDIOGRAM (TEE);  Surgeon: Pixie Casino, MD;  Location: [facility] ENDOSCOPY;  Service: Cardiovascular;  Laterality: N/A;  cath to follow  . TRANSURETHRAL RESECTION OF BLADDER TUMOR  10/01/2011   Procedure: TRANSURETHRAL RESECTION OF BLADDER TUMOR (TURBT);  Surgeon: [name], MD;  [facility];  Service: Urology;  Laterality: N/A;  . TRANSURETHRAL RESECTION OF BLADDER TUMOR N/A 11/19/2013   Procedure: TRANSURETHRAL RESECTION OF BLADDER TUMOR (TURBT);  Surgeon: [name], MD;  [facility];  Service: Urology;  Laterality: N/A;  . URETEROSCOPY  01/27/2012   Procedure: URETEROSCOPY;  Surgeon: [name], MD;  [facility];  Service: Urology;  Laterality: Left;  CYSTO, Left RETROGRADE PYELOGRPHY, LEFT URETEROSCOPY   . URINARY SPHINCTER IMPLANT    . VARICOSE VEIN SURGERY      There were no  vitals filed for this visit.      Subjective Assessment - 07/16/16 1112    Subjective I am going to [place] in 2 weeks for several weeks. I want more exercises. Can I see a Chiropractor while I am there?   How long can you walk comfortably? increases pain   Patient Stated Goals reduce back pain to return to walking and kayaking   Currently in Pain? No/denies   Aggravating Factors  walking   Pain Relieving Factors sitting   Effect of Pain on Daily  Activities walking                         [facility] Adult PT Treatment/Exercise - 07/16/16 0001      Knee/Hip Exercises: Standing   Wall Squat --  x 20 with ROM as tolerated     Knee/Hip Exercises: Supine   Other Supine Knee/Hip Exercises bridge x 20 with glute set; unilat bridge x 20;      Knee/Hip Exercises: Prone   Other Prone Exercises prone: unilat hip ext x 20, donkey kick x 20, heel squeeze x 20     Shoulder Exercises: Supine   Other Supine Exercises RTB chest pull x 20; attempted RTB to GTB to BTB chest pull x 20; BTB diagonal pull x 20; pt feels he is stronger than he is even though his technique faltered with increased resistance; pt does not realize body awareness/mechanics with exercise just that it is "too easy" even though his technique became poor.      Shoulder Exercises: ROM/Strengthening   Other ROM/Strengthening Exercises Machine: 20 lb row x 20 unilat and bil; shoulder ext unilat and bil 20 lbs x 20; combo bil row and shoulder ext 20 lbs x 20 with max verbal cues for cadence and technique; wall pushups x 20                PT Education - 07/16/16 1134    Education provided Yes   Education Details pt asked about difference between PT and Chiropractor and if they could be practiced together. Discussed that most PTs/chiropractors would not allow for treatment at the same time and why. Discussed when he goes to [place] to take his HEP issued by PT even if he has to see a chiropractor there. He states there are no PTs there and there is an abundance of chiropractors.   Person(s) Educated Patient   Methods Explanation   Comprehension Verbalized understanding          PT Short Term Goals - 07/14/16 1600      PT SHORT TERM GOAL #1   Title independent with initial home exercise program for flexibility and postural strength   Time 4   Period Weeks   Status On-going     PT SHORT TERM GOAL #2   Title ambulate for 30 minutes with upright  posture and pain decreased >/= 25%   Time 4   Period Weeks   Status On-going           PT Long Term Goals - 07/14/16 1601      PT LONG TERM GOAL #1   Title understand how to exercise at the gym with correct body mechanics and progress himself   Time 8   Period Weeks   Status On-going     PT LONG TERM GOAL #2   Title ability to walk for 45 minutes with back pain decreased >/= 75% due to improve posture  and strength   Time 8   Period Weeks   Status On-going     PT LONG TERM GOAL #3   Title ability to walk with full bilateral hip extension due to increased strength >/= 4+/5   Time 8   Period Weeks   Status On-going     PT LONG TERM GOAL #4   Title lumbar extension decreased >/= 25% so he is able to stand erect with minimal to no difficulty   Time 8   Period Weeks   Status On-going               Plan - 07/16/16 1135    Clinical Impression Statement Pt desires to perform harder exercises even though his technique becomes poor with additional weight or band resistance. He is eager to perform activities and have more exercises to do. Discussed that PT also needs to evaluate his response to treatment prior to issuing new exercises. PT reviewed exercises pt brought to therapy and discussed not every exercise done in PT will be issued as a HEP. Pt deemed understanding.   Rehab Potential Excellent   Clinical Impairments Affecting Rehab Potential History of prostate cancer   PT Frequency 2x / week   PT Duration 8 weeks   PT Treatment/Interventions Cryotherapy;Electrical Stimulation;Gait training;Moist Heat;Therapeutic activities;Therapeutic exercise;Neuromuscular re-education;Patient/family education;Passive range of motion;Manual techniques;Dry needling;Taping   PT Next Visit Plan Continue working on postural strength, hip and knee strength, standing posture, joint mobilization to spine   PT Home Exercise Plan progress as needed   Consulted and Agree with Plan of Care  Patient      Patient will benefit from skilled therapeutic intervention in order to improve the following deficits and impairments:  Pain, Abnormal gait, Decreased mobility, Decreased strength, Postural dysfunction, Difficulty walking, Decreased activity tolerance  Visit Diagnosis: Muscle weakness (generalized)  Abnormal posture  Other abnormalities of gait and mobility  Chronic bilateral low back pain without sciatica     Problem List Patient Active Problem List   Diagnosis Date Noted  . Fatigue 11/04/2015  . CAD (coronary artery disease) 08/01/2015  . Pulmonary hypertension 07/24/2015  . Atrial fibrillation ([place]) 07/16/2015  . Aortic insufficiency 01/04/2014  . Squamous cell carcinoma in situ of skin of right lower leg 11/13/2013  . Lentigo maligna of cheek ([place]) 09/19/2013  . Actinic keratosis 11/06/2012  . Macular degeneration 08/10/2012  . Allergic rhinitis 08/10/2012  . Bladder cancer ([place]) 10/19/2011  . Eczema 02/23/2011  . Hyperglycemia 02/11/2011  . Heart murmur 09/10/2010  . ABNORMAL THYROID FUNCTION TESTS 08/05/2009  . ABNORMAL HEART RHYTHMS 07/11/2009  . HYPERLIPIDEMIA 11/28/2008  . CAROTID BRUIT 11/19/2008  . UNSPECIFIED CATARACT 10/17/2008  . PARESTHESIA 12/04/2007  . Varicose veins of bilateral lower extremities with other complications 08/65/7846  . COLONIC POLYPS, HX OF 08/28/2007  . Essential hypertension 02/08/2007  . PROSTATE CANCER, HX OF 11/04/2006  . INGUINAL HERNIA, HX OF 11/04/2006    Myra Rude, PT 07/16/2016, 12:02 PM  [facility] [address] [place], [place], [address] Phone: [phone]   Fax:  [phone]  Name: Jeremy Kelley MRN: [id number] Date of Birth: 11-12-1926

## 2016-07-17 ENCOUNTER — Other Ambulatory Visit: Payer: Self-pay | Admitting: Adult Health

## 2016-07-17 DIAGNOSIS — Z760 Encounter for issue of repeat prescription: Secondary | ICD-10-CM

## 2016-07-19 ENCOUNTER — Ambulatory Visit: Payer: Medicare Other | Admitting: Physical Therapy

## 2016-07-19 ENCOUNTER — Other Ambulatory Visit: Payer: Self-pay | Admitting: Adult Health

## 2016-07-19 ENCOUNTER — Encounter: Payer: Self-pay | Admitting: Physical Therapy

## 2016-07-19 DIAGNOSIS — M545 Low back pain, unspecified: Secondary | ICD-10-CM

## 2016-07-19 DIAGNOSIS — R2689 Other abnormalities of gait and mobility: Secondary | ICD-10-CM

## 2016-07-19 DIAGNOSIS — G8929 Other chronic pain: Secondary | ICD-10-CM

## 2016-07-19 DIAGNOSIS — R293 Abnormal posture: Secondary | ICD-10-CM

## 2016-07-19 DIAGNOSIS — M6281 Muscle weakness (generalized): Secondary | ICD-10-CM | POA: Diagnosis not present

## 2016-07-19 DIAGNOSIS — Z760 Encounter for issue of repeat prescription: Secondary | ICD-10-CM

## 2016-07-19 MED ORDER — METOPROLOL SUCCINATE ER 25 MG PO TB24: 25.0000 mg | ORAL_TABLET | Freq: Every day | ORAL | 3 refills | Status: DC

## 2016-07-19 NOTE — Patient Instructions (Addendum)
EXTENSION: Standing - Resistance Band: Stable (Active)   Stand, right arm at side. Against red resistance band, draw arm backward, as far as possible, keeping elbow straight. Complete _2__ sets of _10-15__ repetitions. Perform _1-2__ sessions per day.  Copyright  VHI. All rights reserved.  Row: Mid-Range - Standing   With red band anchored at chest level, pull elbows backward, squeezing shoulder blades together. Keep head and spine neutral. Row _10-15__ times, _1-2__ times per day.  http://ss.exer.us/290   Copyright  VHI. All rights reserved.  Resistive Band Rowing   With resistive band anchored in door, grasp both ends. Keeping elbows bent, pull back, squeezing shoulder blades together. Hold __2__ seconds. Repeat __10-15__ times. Do __1-2__ sessions per day.  http://gt2.exer.us/97   Copyright  VHI. All rights reserved.  Bridging    Lie on back with feet shoulder width apart. Lift hips toward the ceiling. Hold _2-5___ seconds. Repeat _10___ times. Do ___2_ sessions per day.  http://gt2.exer.us/356   Copyright  VHI. All rights reserved.

## 2016-07-19 NOTE — Therapy (Signed)
[facility] [address] [place], [place], [address] Phone: [phone]   Fax:  [phone]  Physical Therapy Treatment  Patient Details  Name: Jeremy Kelley MRN: [id number] Date of Birth: 03/30/1927 Referring Provider: Dr. [name]  Encounter Date: 07/19/2016      PT End of Session - 07/19/16 1445    Visit Number 4   Number of Visits 10   Date for PT Re-Evaluation 09/01/16   Authorization Type medicare g-code 10th visit; Kx modifier 15th   PT Start Time 4967   PT Stop Time 1525   PT Time Calculation (min) 43 min   Activity Tolerance Patient tolerated treatment well   Behavior During Therapy [facility] for tasks assessed/performed      Past Medical History:  Diagnosis Date  . Arthritis   . Carotid artery stenosis 11/2008   bilateral 40-59% stenosis  . Colon polyps   . Diverticulosis   . Hepatitis    doesn't know which type 1969  . Hyperlipidemia   . Hypertension   . Numbness    fingertips  . Phimosis   . Prostate cancer ([place]) 2001  . Urothelial carcinoma of left distal ureter [facility])     Past Surgical History:  Procedure Laterality Date  . BIOPSY  10/01/2011   Procedure: BIOPSY;  Surgeon: [name], MD;  [facility];  Service: Urology;;  biopsy of bladder tumor  . BLADDER SUSPENSION  08/18/10   [name]  . CARDIAC CATHETERIZATION N/A 07/29/2015   Procedure: Right/Left Heart Cath and Coronary Angiography;  Surgeon: Peter M Martinique, MD;  Location: [place] CV LAB;  Service: Cardiovascular;  Laterality: N/A;  . CATARACT EXTRACTION W/ INTRAOCULAR LENS  IMPLANT, BILATERAL Bilateral   . CYSTOSCOPY W/ RETROGRADES  01/27/2012   Procedure: CYSTOSCOPY WITH RETROGRADE PYELOGRAM;  Surgeon: [name], MD;  [facility];  Service: Urology;  Laterality: Left;  . CYSTOSCOPY WITH RETROGRADE PYELOGRAM, URETEROSCOPY AND STENT PLACEMENT Left 11/24/2012   Procedure: CYSTOSCOPY WITH left RETROGRADE PYELOGRAM,  bladder washings, ureteroscopy;  Surgeon: [name], MD;  [facility];  Service: Urology;  Laterality: Left;  . CYSTOSCOPY WITH RETROGRADE PYELOGRAM, URETEROSCOPY AND STENT PLACEMENT Bilateral 11/19/2013   Procedure: CYSTOSCOPY WITH BILATERAL RETROGRADE PYELOGRAM,;  Surgeon: [name], MD;  [facility];  Service: Urology;  Laterality: Bilateral;  . EYE SURGERY  09/24/10   left eye. "Retina surgery"  . INGUINAL HERNIA REPAIR  1960  . PROSTATE SURGERY     s/p laparoscopic prostate surgery  . SKIN CANCER EXCISION     a/p skin cancer right foot-non melanoma  . TEE WITHOUT CARDIOVERSION N/A 07/29/2015   Procedure: TRANSESOPHAGEAL ECHOCARDIOGRAM (TEE);  Surgeon: Pixie Casino, MD;  Location: [facility] ENDOSCOPY;  Service: Cardiovascular;  Laterality: N/A;  cath to follow  . TRANSURETHRAL RESECTION OF BLADDER TUMOR  10/01/2011   Procedure: TRANSURETHRAL RESECTION OF BLADDER TUMOR (TURBT);  Surgeon: [name], MD;  [facility];  Service: Urology;  Laterality: N/A;  . TRANSURETHRAL RESECTION OF BLADDER TUMOR N/A 11/19/2013   Procedure: TRANSURETHRAL RESECTION OF BLADDER TUMOR (TURBT);  Surgeon: [name], MD;  [facility];  Service: Urology;  Laterality: N/A;  . URETEROSCOPY  01/27/2012   Procedure: URETEROSCOPY;  Surgeon: [name], MD;  [facility];  Service: Urology;  Laterality: Left;  CYSTO, Left RETROGRADE PYELOGRPHY, LEFT URETEROSCOPY   . URINARY SPHINCTER IMPLANT    . VARICOSE VEIN SURGERY      There were no  vitals filed for this visit.      Subjective Assessment - 07/19/16 1445    Subjective Going to [place] in 2 weeks.    Currently in Pain? No/denies   Multiple Pain Sites No                         [facility] Adult PT Treatment/Exercise - 07/19/16 0001      Knee/Hip Exercises: Aerobic   Nustep L2 x 6 min  LE only, PTA monitored and encouraged full knee extension     Knee/Hip Exercises: Supine   Bridges with Ball Squeeze --  BASIC BRidge 2x10       Shoulder Exercises: Supine   Horizontal ABduction Strengthening;Both;20 reps;Theraband   Theraband Level (Shoulder Horizontal ABduction) Level 2 (Red)     Shoulder Exercises: Standing   Extension Strengthening;Both;20 reps;Theraband  VC for posture   Theraband Level (Shoulder Extension) Level 2 (Red)   Row Strengthening;Both;20 reps;Theraband   Theraband Level (Shoulder Row) Level 2 (Red)   Retraction --  Mid rows with red band 2x 10: VC for technique                PT Education - 07/19/16 1447    Education provided Yes   Education Details Red band scap attached in standing and supine bridge for HEP   Person(s) Educated Patient   Methods Explanation;Demonstration;Tactile cues;Verbal cues;Handout   Comprehension Verbalized understanding;Returned demonstration          PT Short Term Goals - 07/14/16 1600      PT SHORT TERM GOAL #1   Title independent with initial home exercise program for flexibility and postural strength   Time 4   Period Weeks   Status On-going     PT SHORT TERM GOAL #2   Title ambulate for 30 minutes with upright posture and pain decreased >/= 25%   Time 4   Period Weeks   Status On-going           PT Long Term Goals - 07/19/16 1516      PT LONG TERM GOAL #2   Title ability to walk for 45 minutes with back pain decreased >/= 75% due to improve posture and strength   Time 8   Period Weeks   Status On-going  Nothing consistent               Plan - 07/19/16 1446    Clinical Impression Statement Good technique with exercises today. He requested more pictures for his HEp that we worked on in todays session. Pt was encouraged in thinking more about his body position when he exercised vs how hard it is.     Rehab Potential Excellent   Clinical Impairments Affecting Rehab Potential History of prostate cancer   PT Frequency 2x / week   PT Duration 8 weeks   PT Next Visit Plan Continue working on postural strength, hip and  knee strength, standing posture, joint mobilization to spine   Consulted and Agree with Plan of Care --      Patient will benefit from skilled therapeutic intervention in order to improve the following deficits and impairments:  Pain, Abnormal gait, Decreased mobility, Decreased strength, Postural dysfunction, Difficulty walking, Decreased activity tolerance  Visit Diagnosis: Muscle weakness (generalized)  Abnormal posture  Other abnormalities of gait and mobility  Chronic bilateral low back pain without sciatica     Problem List Patient Active Problem List   Diagnosis Date Noted  .  Fatigue 11/04/2015  . CAD (coronary artery disease) 08/01/2015  . Pulmonary hypertension 07/24/2015  . Atrial fibrillation ([place]) 07/16/2015  . Aortic insufficiency 01/04/2014  . Squamous cell carcinoma in situ of skin of right lower leg 11/13/2013  . Lentigo maligna of cheek ([place]) 09/19/2013  . Actinic keratosis 11/06/2012  . Macular degeneration 08/10/2012  . Allergic rhinitis 08/10/2012  . Bladder cancer ([place]) 10/19/2011  . Eczema 02/23/2011  . Hyperglycemia 02/11/2011  . Heart murmur 09/10/2010  . ABNORMAL THYROID FUNCTION TESTS 08/05/2009  . ABNORMAL HEART RHYTHMS 07/11/2009  . HYPERLIPIDEMIA 11/28/2008  . CAROTID BRUIT 11/19/2008  . UNSPECIFIED CATARACT 10/17/2008  . PARESTHESIA 12/04/2007  . Varicose veins of bilateral lower extremities with other complications 47/12/2955  . COLONIC POLYPS, HX OF 08/28/2007  . Essential hypertension 02/08/2007  . PROSTATE CANCER, HX OF 11/04/2006  . INGUINAL HERNIA, HX OF 11/04/2006    Cerys Winget, PTA 07/19/2016, 3:26 PM  [facility] [address] [place], [place], [address] Phone: [phone]   Fax:  [phone]  Name: Jeremy Kelley MRN: [id number] Date of Birth: 01/26/1927

## 2016-07-21 ENCOUNTER — Ambulatory Visit: Payer: Medicare Other | Admitting: Physical Therapy

## 2016-07-21 ENCOUNTER — Encounter: Payer: Self-pay | Admitting: Physical Therapy

## 2016-07-21 DIAGNOSIS — R2689 Other abnormalities of gait and mobility: Secondary | ICD-10-CM

## 2016-07-21 DIAGNOSIS — M6281 Muscle weakness (generalized): Secondary | ICD-10-CM

## 2016-07-21 DIAGNOSIS — R293 Abnormal posture: Secondary | ICD-10-CM

## 2016-07-21 DIAGNOSIS — M545 Low back pain, unspecified: Secondary | ICD-10-CM

## 2016-07-21 DIAGNOSIS — G8929 Other chronic pain: Secondary | ICD-10-CM

## 2016-07-21 NOTE — Patient Instructions (Signed)
Heel Raise: Bilateral (Standing)    Rise on balls of feet. Repeat _10___ times per set. Do __2__ sets per session. Do __1-2__ sessions per day. POSTURE!! Lift your lower abs.   http://orth.exer.us/38   Copyright  VHI. All rights reserved.  ABDUCTION: Standing (Active)    Stand, feet flat. Lift right leg out to side. Alternate sides. We can add weights later. Work first on establishing good posture. Complete __2_ sets of _10__ repetitions. Perform _1-2__ sessions per day.  http://gtsc.exer.us/110   #3 ( no picture) Laying on your back with a good pillow. Bend the knees. Pull the lower abs in slightly and perform marching with your legs; lift the right foot then the left like you are marching. Do 20x 1-2x day  #4 In the same position on your back: tie the green band around your knees. Slowly stretch the band outwards and control it back. So knees open then close. Do 2 sets of 15-20.

## 2016-07-22 ENCOUNTER — Ambulatory Visit (INDEPENDENT_AMBULATORY_CARE_PROVIDER_SITE_OTHER): Payer: Medicare Other | Admitting: Adult Health

## 2016-07-22 ENCOUNTER — Encounter: Payer: Self-pay | Admitting: Adult Health

## 2016-07-22 ENCOUNTER — Ambulatory Visit: Payer: Medicare Other | Admitting: Adult Health

## 2016-07-22 VITALS — BP 120/68 | Temp 98.5°F | Wt 133.0 lb

## 2016-07-22 DIAGNOSIS — G8929 Other chronic pain: Secondary | ICD-10-CM

## 2016-07-22 DIAGNOSIS — R600 Localized edema: Secondary | ICD-10-CM

## 2016-07-22 DIAGNOSIS — M549 Dorsalgia, unspecified: Secondary | ICD-10-CM

## 2016-07-22 DIAGNOSIS — R5382 Chronic fatigue, unspecified: Secondary | ICD-10-CM

## 2016-07-22 NOTE — Progress Notes (Signed)
Subjective:    Patient ID: Jeremy Kelley, male    DOB: 16-Feb-1927, 83 y.o.   MRN: [id number]  HPI  83 year old male who presents to the office today for follow up   1. He reports that since starting physical therapy he has noticed significant improvement in his back pain. He has been doing home exercises which she also believes helps. He continues to go to physical therapy until his sessions are completed  2. Continues to complain of fatigue which is the most bothersome issue for him.   3. Lower extremity swelling. He has noticed since cutting back on Lasix from 40 mg to 20 mg that he has trace swelling in his lower extremities. Denies any worsening discomfort in his lower extremities  Review of Systems See HPI   Past Medical History:  Diagnosis Date  . Arthritis   . Carotid artery stenosis 11/2008   bilateral 40-59% stenosis  . Colon polyps   . Diverticulosis   . Hepatitis    doesn't know which type 1969  . Hyperlipidemia   . Hypertension   . Numbness    fingertips  . Phimosis   . Prostate cancer ([place]) 2001  . Urothelial carcinoma of left distal ureter [facility])     Social History   Social History  . Marital status: Single    Spouse name: N/A  . Number of children: 0  . Years of education: N/A   Occupational History  . Retired    Social History Main Topics  . Smoking status: Passive Smoke Exposure - Never Smoker    Types: Cigars  . Smokeless tobacco: Never Used     Comment: 07/29/2015 "might smoke a cigar once/year"  . Alcohol use 8.4 oz/week    14 Glasses of wine per week     Comment: 07/29/2015 "big glass of wine q night"  . Drug use: No  . Sexual activity: Not on file   Other Topics Concern  . Not on file   Social History Narrative   Patient is single, has never been married.  Does not have any children.  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   He drinks approximately 3-4 glasses of wine per week.  No history of excessive alcohol  use.  Occasionally smokes a cigar.   Spends winters in [place]         Past Surgical History:  Procedure Laterality Date  . BIOPSY  10/01/2011   Procedure: BIOPSY;  Surgeon: [name], MD;  [facility];  Service: Urology;;  biopsy of bladder tumor  . BLADDER SUSPENSION  08/18/10   [name]  . CARDIAC CATHETERIZATION N/A 07/29/2015   Procedure: Right/Left Heart Cath and Coronary Angiography;  Surgeon: Peter M Martinique, MD;  Location: [place] CV LAB;  Service: Cardiovascular;  Laterality: N/A;  . CATARACT EXTRACTION W/ INTRAOCULAR LENS  IMPLANT, BILATERAL Bilateral   . CYSTOSCOPY W/ RETROGRADES  01/27/2012   Procedure: CYSTOSCOPY WITH RETROGRADE PYELOGRAM;  Surgeon: [name], MD;  [facility];  Service: Urology;  Laterality: Left;  . CYSTOSCOPY WITH RETROGRADE PYELOGRAM, URETEROSCOPY AND STENT PLACEMENT Left 11/24/2012   Procedure: CYSTOSCOPY WITH left RETROGRADE PYELOGRAM, bladder washings, ureteroscopy;  Surgeon: [name], MD;  [facility];  Service: Urology;  Laterality: Left;  . CYSTOSCOPY WITH RETROGRADE PYELOGRAM, URETEROSCOPY AND STENT PLACEMENT Bilateral 11/19/2013   Procedure: CYSTOSCOPY WITH BILATERAL RETROGRADE PYELOGRAM,;  Surgeon: [name], MD;  [facility];  Service: Urology;  Laterality:  Bilateral;  . EYE SURGERY  09/24/10   left eye. "Retina surgery"  . INGUINAL HERNIA REPAIR  1960  . PROSTATE SURGERY     s/p laparoscopic prostate surgery  . SKIN CANCER EXCISION     a/p skin cancer right foot-non melanoma  . TEE WITHOUT CARDIOVERSION N/A 07/29/2015   Procedure: TRANSESOPHAGEAL ECHOCARDIOGRAM (TEE);  Surgeon: Pixie Casino, MD;  Location: [facility] ENDOSCOPY;  Service: Cardiovascular;  Laterality: N/A;  cath to follow  . TRANSURETHRAL RESECTION OF BLADDER TUMOR  10/01/2011   Procedure: TRANSURETHRAL RESECTION OF BLADDER TUMOR (TURBT);  Surgeon: [name], MD;  [facility];  Service: Urology;  Laterality: N/A;  . TRANSURETHRAL RESECTION OF  BLADDER TUMOR N/A 11/19/2013   Procedure: TRANSURETHRAL RESECTION OF BLADDER TUMOR (TURBT);  Surgeon: [name], MD;  [facility];  Service: Urology;  Laterality: N/A;  . URETEROSCOPY  01/27/2012   Procedure: URETEROSCOPY;  Surgeon: [name], MD;  [facility];  Service: Urology;  Laterality: Left;  CYSTO, Left RETROGRADE PYELOGRPHY, LEFT URETEROSCOPY   . URINARY SPHINCTER IMPLANT    . VARICOSE VEIN SURGERY      Family History  Problem Relation Age of Onset  . Colon cancer Neg Hx   . Colon polyps Neg Hx     No Known Allergies  Current Outpatient Prescriptions on File Prior to Visit  Medication Sig Dispense Refill  . amLODipine (NORVASC) 2.5 MG tablet Take 1 tablet (2.5 mg total) by mouth daily. 90 tablet 3  . apixaban (ELIQUIS) 2.5 MG TABS tablet Take 1 tablet (2.5 mg total) by mouth 2 (two) times daily. 180 tablet 0  . B Complex-C (B-COMPLEX WITH VITAMIN C) tablet Take 1 tablet by mouth daily. Reported on 07/24/2015    . cholecalciferol (VITAMIN D) 1000 units tablet Take 1,000 Units by mouth daily.    . furosemide (LASIX) 20 MG tablet Take 1 tablet (20 mg total) by mouth daily. 90 tablet 3  . KLOR-CON M10 10 MEQ tablet     . methylcellulose (ARTIFICIAL TEARS) 1 % ophthalmic solution Place 1 drop into both eyes 2 (two) times daily.     . metoprolol succinate (TOPROL-XL) 25 MG 24 hr tablet Take 1 tablet (25 mg total) by mouth at bedtime. 90 tablet 3  . mirabegron ER (MYRBETRIQ) 50 MG TB24 tablet Take 1 tablet (50 mg total) by mouth daily. 90 tablet 3  . Multiple Vitamins-Minerals (PRESERVISION/LUTEIN PO) Take 1 tablet by mouth daily.    . pravastatin (PRAVACHOL) 20 MG tablet Take 1 tablet (20 mg total) by mouth at bedtime. 90 tablet 3   No current facility-administered medications on file prior to visit.     BP 120/68 (BP Location: Left Arm, Patient Position: Sitting, Cuff Size: Normal)   Temp 98.5 F (36.9 C) (Oral)   Wt 133 lb (60.3 kg)   BMI 19.08 kg/m         Objective:   Physical Exam  Constitutional: He appears well-developed and well-nourished. No distress.  Cardiovascular: Normal rate, regular rhythm, normal heart sounds and intact distal pulses.  Exam reveals no gallop and no friction rub.   No murmur heard. Pulmonary/Chest: Effort normal and breath sounds normal. No respiratory distress. He has no wheezes. He has no rales. He exhibits no tenderness.  Musculoskeletal: He exhibits edema (Trace nonpitting edema in bilateral lower extremities).  Skin: Skin is warm and dry. No rash noted. He is not diaphoretic. No erythema. No pallor.  Psychiatric: He has a normal mood  and affect. His behavior is normal. Judgment and thought content normal.  Nursing note and vitals reviewed.     Assessment & Plan:  I advised him to continue with physical therapy as it is improving his back pain. He also spoke at length about his fatigue, which she had done at multiple other office visits. Diet as well as Dr. Cathie Olden believe that his fatigue is contributed to his age and cardiac issues, there is not one thing currently that we can do to help him improve his fatigue. He was advised to continue exercising and caring about his daily activities  We spoke about going back up to 40 mg of Lasix, Adrean does not feel as though his lower extremity edema is severe enough to warrant increasing medication. I will allow him to titrate as needed at home  Follow up as needed  [name], NP

## 2016-07-23 DIAGNOSIS — M5136 Other intervertebral disc degeneration, lumbar region: Secondary | ICD-10-CM | POA: Diagnosis not present

## 2016-07-23 DIAGNOSIS — M9902 Segmental and somatic dysfunction of thoracic region: Secondary | ICD-10-CM | POA: Diagnosis not present

## 2016-07-23 DIAGNOSIS — M5134 Other intervertebral disc degeneration, thoracic region: Secondary | ICD-10-CM | POA: Diagnosis not present

## 2016-07-23 DIAGNOSIS — M9903 Segmental and somatic dysfunction of lumbar region: Secondary | ICD-10-CM | POA: Diagnosis not present

## 2016-07-26 DIAGNOSIS — Z8546 Personal history of malignant neoplasm of prostate: Secondary | ICD-10-CM | POA: Diagnosis not present

## 2016-07-26 LAB — PSA: PSA: 0.015

## 2016-08-03 ENCOUNTER — Ambulatory Visit: Payer: Medicare Other | Admitting: Adult Health

## 2016-08-03 ENCOUNTER — Encounter: Payer: Self-pay | Admitting: Physical Therapy

## 2016-08-03 ENCOUNTER — Ambulatory Visit: Payer: Medicare Other | Admitting: Physical Therapy

## 2016-08-03 DIAGNOSIS — G8929 Other chronic pain: Secondary | ICD-10-CM

## 2016-08-03 DIAGNOSIS — M545 Low back pain, unspecified: Secondary | ICD-10-CM

## 2016-08-03 DIAGNOSIS — R293 Abnormal posture: Secondary | ICD-10-CM | POA: Diagnosis not present

## 2016-08-03 DIAGNOSIS — R2689 Other abnormalities of gait and mobility: Secondary | ICD-10-CM | POA: Diagnosis not present

## 2016-08-03 DIAGNOSIS — M6281 Muscle weakness (generalized): Secondary | ICD-10-CM

## 2016-08-03 NOTE — Therapy (Signed)
[place], [place], [address] Phone: [phone]   Fax:  [phone]  Physical Therapy Treatment  Patient Details  Name: Jeremy Kelley MRN: [id number] Date of Birth: [birth year] Referring Provider: Dr. [name]  Encounter Date: 08/03/2016      PT End of Session - 08/03/16 1609    Visit Number 6   Number of Visits 10   Date for PT Re-Evaluation 09/01/16   Authorization Type medicare g-code 10th visit; Kx modifier 15th   PT Start Time 1500   PT Stop Time 1546   PT Time Calculation (min) 46 min   Activity Tolerance Patient tolerated treatment well   Behavior During Therapy [facility] for tasks assessed/performed      Past Medical History:  Diagnosis Date  . Arthritis   . Carotid artery stenosis 11/2008   bilateral 40-59% stenosis  . Colon polyps   . Diverticulosis   . Hepatitis    doesn't know which type 1969  . Hyperlipidemia   . Hypertension   . Numbness    fingertips  . Phimosis   . Prostate cancer ([place]) 2001  . Urothelial carcinoma of left distal ureter [facility])     Past Surgical History:  Procedure Laterality Date  . BIOPSY  10/01/2011   Procedure: BIOPSY;  Surgeon: [name], MD;  [facility];  Service: Urology;;  biopsy of bladder tumor  . BLADDER SUSPENSION  08/18/10   [name]  . CARDIAC CATHETERIZATION N/A 07/29/2015   Procedure: Right/Left Heart Cath and Coronary Angiography;  Surgeon: Peter M Martinique, MD;  Location: [place] CV LAB;  Service: Cardiovascular;  Laterality: N/A;  . CATARACT EXTRACTION W/ INTRAOCULAR LENS  IMPLANT, BILATERAL Bilateral   . CYSTOSCOPY W/ RETROGRADES  01/27/2012   Procedure: CYSTOSCOPY WITH RETROGRADE PYELOGRAM;  Surgeon: [name], MD;  [facility];  Service: Urology;  Laterality: Left;  . CYSTOSCOPY WITH RETROGRADE PYELOGRAM, URETEROSCOPY AND STENT PLACEMENT Left 11/24/2012   Procedure: CYSTOSCOPY WITH left RETROGRADE PYELOGRAM, bladder  washings, ureteroscopy;  Surgeon: [name], MD;  [facility];  Service: Urology;  Laterality: Left;  . CYSTOSCOPY WITH RETROGRADE PYELOGRAM, URETEROSCOPY AND STENT PLACEMENT Bilateral 11/19/2013   Procedure: CYSTOSCOPY WITH BILATERAL RETROGRADE PYELOGRAM,;  Surgeon: [name], MD;  [facility];  Service: Urology;  Laterality: Bilateral;  . EYE SURGERY  09/24/10   left eye. "Retina surgery"  . INGUINAL HERNIA REPAIR  1960  . PROSTATE SURGERY     s/p laparoscopic prostate surgery  . SKIN CANCER EXCISION     a/p skin cancer right foot-non melanoma  . TEE WITHOUT CARDIOVERSION N/A 07/29/2015   Procedure: TRANSESOPHAGEAL ECHOCARDIOGRAM (TEE);  Surgeon: Pixie Casino, MD;  Location: [facility]COPY;  Service: Cardiovascular;  Laterality: N/A;  cath to follow  . TRANSURETHRAL RESECTION OF BLADDER TUMOR  10/01/2011   Procedure: TRANSURETHRAL RESECTION OF BLADDER TUMOR (TURBT);  Surgeon: [name], MD;  [facility];  Service: Urology;  Laterality: N/A;  . TRANSURETHRAL RESECTION OF BLADDER TUMOR N/A 11/19/2013   Procedure: TRANSURETHRAL RESECTION OF BLADDER TUMOR (TURBT);  Surgeon: [name], MD;  [facility];  Service: Urology;  Laterality: N/A;  . URETEROSCOPY  01/27/2012   Procedure: URETEROSCOPY;  Surgeon: [name], MD;  [facility];  Service: Urology;  Laterality: Left;  CYSTO, Left RETROGRADE PYELOGRPHY, LEFT URETEROSCOPY   . URINARY SPHINCTER IMPLANT    . VARICOSE VEIN SURGERY      There were no vitals filed  for this visit.      Subjective Assessment - 08/03/16 1454    Subjective "having some improvement with PT. Most of the pain is when I am in [place] due to walking more but when I am in [place] it isn't too bad"   Currently in Pain? Yes   Pain Score 1    Pain Descriptors / Indicators Discomfort   Pain Type Chronic pain   Pain Onset More than a month ago   Pain Frequency Intermittent   Aggravating Factors  walking    Pain Relieving Factors  sitting                          [facility] Adult PT Treatment/Exercise - 08/03/16 1652      Lumbar Exercises: Stretches   Lower Trunk Rotation 5 reps;60 seconds     Knee/Hip Exercises: Standing   Hip Abduction Stengthening;2 sets;10 reps;Knee straight  to avoid trunk lean compensation   Hip Extension Stengthening;2 sets;10 reps;Knee straight  cues to avoid compensation     Knee/Hip Exercises: Seated   Sit to Sand 1 set;10 reps     Manual Therapy   Manual Therapy Joint mobilization   Manual therapy comments manual trigger point release over  Rlumbar paraspinals   Joint Mobilization grade 3 PA on l1-L5, R grade 3 T10-T12 rib mobs                  PT Short Term Goals - 07/14/16 1600      PT SHORT TERM GOAL #1   Title independent with initial home exercise program for flexibility and postural strength   Time 4   Period Weeks   Status On-going     PT SHORT TERM GOAL #2   Title ambulate for 30 minutes with upright posture and pain decreased >/= 25%   Time 4   Period Weeks   Status On-going           PT Long Term Goals - 07/19/16 1516      PT LONG TERM GOAL #2   Title ability to walk for 45 minutes with back pain decreased >/= 75% due to improve posture and strength   Time 8   Period Weeks   Status On-going  Nothing consistent               Plan - 08/03/16 1611    Clinical Impression Statement pt reported feeling soreness inthe low back which seems to occur with prolonged walking/ standing. reviewed previously provided HEP and consolidated exercises. He utilizes scapular retraciton to stabilize the spine due to weakness. Following manual work he rpeorted feeling some soreness but declined modalities.    PT Next Visit Plan Continue working on postural strength, hip and knee strength, standing posture, joint mobilization to spine   PT Home Exercise Plan standing hip abduction/ extension, sit to stand, LTR, supine marching   Consulted and  Agree with Plan of Care Patient      Patient will benefit from skilled therapeutic intervention in order to improve the following deficits and impairments:  Pain, Abnormal gait, Decreased mobility, Decreased strength, Postural dysfunction, Difficulty walking, Decreased activity tolerance  Visit Diagnosis: Muscle weakness (generalized)  Abnormal posture  Chronic bilateral low back pain without sciatica  Other abnormalities of gait and mobility     Problem List Patient Active Problem List   Diagnosis Date Noted  . Fatigue 11/04/2015  . CAD (coronary artery disease) 08/01/2015  .  Pulmonary hypertension ([place]) 07/24/2015  . Atrial fibrillation ([place]) 07/16/2015  . Aortic insufficiency 01/04/2014  . Squamous cell carcinoma in situ of skin of right lower leg 11/13/2013  . Lentigo maligna of cheek ([place]) 09/19/2013  . Actinic keratosis 11/06/2012  . Macular degeneration 08/10/2012  . Allergic rhinitis 08/10/2012  . Bladder cancer ([place]) 10/19/2011  . Eczema 02/23/2011  . Hyperglycemia 02/11/2011  . Heart murmur 09/10/2010  . ABNORMAL THYROID FUNCTION TESTS 08/05/2009  . ABNORMAL HEART RHYTHMS 07/11/2009  . HYPERLIPIDEMIA 11/28/2008  . CAROTID BRUIT 11/19/2008  . UNSPECIFIED CATARACT 10/17/2008  . PARESTHESIA 12/04/2007  . Varicose veins of bilateral lower extremities with other complications 23/53/6144  . COLONIC POLYPS, HX OF 08/28/2007  . Essential hypertension 02/08/2007  . PROSTATE CANCER, HX OF 11/04/2006  . INGUINAL HERNIA, HX OF 11/04/2006   Starr Lake PT, DPT, LAT, ATC  08/03/16  4:59 PM      [facility] [address] [place], [place], [address] Phone: [phone]   Fax:  [phone]  Name: Jeremy Kelley MRN: [id number] Date of Birth: 1926-08-22

## 2016-08-04 DIAGNOSIS — Z8546 Personal history of malignant neoplasm of prostate: Secondary | ICD-10-CM | POA: Diagnosis not present

## 2016-08-04 DIAGNOSIS — R3913 Splitting of urinary stream: Secondary | ICD-10-CM | POA: Diagnosis not present

## 2016-08-04 DIAGNOSIS — Z8551 Personal history of malignant neoplasm of bladder: Secondary | ICD-10-CM | POA: Diagnosis not present

## 2016-08-09 ENCOUNTER — Encounter: Payer: Self-pay | Admitting: Family Medicine

## 2016-08-11 ENCOUNTER — Encounter: Payer: Self-pay | Admitting: Physical Therapy

## 2016-08-11 ENCOUNTER — Ambulatory Visit: Payer: Medicare Other | Attending: Adult Health | Admitting: Physical Therapy

## 2016-08-11 DIAGNOSIS — R293 Abnormal posture: Secondary | ICD-10-CM | POA: Diagnosis not present

## 2016-08-11 DIAGNOSIS — G8929 Other chronic pain: Secondary | ICD-10-CM | POA: Insufficient documentation

## 2016-08-11 DIAGNOSIS — M6281 Muscle weakness (generalized): Secondary | ICD-10-CM

## 2016-08-11 DIAGNOSIS — M545 Low back pain, unspecified: Secondary | ICD-10-CM

## 2016-08-11 DIAGNOSIS — R2689 Other abnormalities of gait and mobility: Secondary | ICD-10-CM | POA: Diagnosis not present

## 2016-08-11 NOTE — Therapy (Signed)
[place], [place], [address] Phone: [phone]   Fax:  [phone]  Physical Therapy Treatment  Patient Details  Name: Jeremy Kelley MRN: [id number] Date of Birth: 09-24-1926 Referring Provider: Dr. [name]  Encounter Date: 08/11/2016      PT End of Session - 08/11/16 1810    Visit Number 7   Number of Visits 10   Date for PT Re-Evaluation 09/01/16   PT Start Time 2376   PT Stop Time 1515   PT Time Calculation (min) 58 min   Behavior During Therapy [facility] for tasks assessed/performed      Past Medical History:  Diagnosis Date  . Arthritis   . Carotid artery stenosis 11/2008   bilateral 40-59% stenosis  . Colon polyps   . Diverticulosis   . Hepatitis    doesn't know which type 1969  . Hyperlipidemia   . Hypertension   . Numbness    fingertips  . Phimosis   . Prostate cancer ([place]) 2001  . Urothelial carcinoma of left distal ureter [facility])     Past Surgical History:  Procedure Laterality Date  . BIOPSY  10/01/2011   Procedure: BIOPSY;  Surgeon: [name], MD;  [facility]: [facility];  Service: Urology;;  biopsy of bladder tumor  . BLADDER SUSPENSION  08/18/10   [name]  . CARDIAC CATHETERIZATION N/A 07/29/2015   Procedure: Right/Left Heart Cath and Coronary Angiography;  Surgeon: Peter M Martinique, MD;  Location: [place] CV LAB;  Service: Cardiovascular;  Laterality: N/A;  . CATARACT EXTRACTION W/ INTRAOCULAR LENS  IMPLANT, BILATERAL Bilateral   . CYSTOSCOPY W/ RETROGRADES  01/27/2012   Procedure: CYSTOSCOPY WITH RETROGRADE PYELOGRAM;  Surgeon: [name], MD;  [facility];  Service: Urology;  Laterality: Left;  . CYSTOSCOPY WITH RETROGRADE PYELOGRAM, URETEROSCOPY AND STENT PLACEMENT Left 11/24/2012   Procedure: CYSTOSCOPY WITH left RETROGRADE PYELOGRAM, bladder washings, ureteroscopy;  Surgeon: [name], MD;  [facility];  Service: Urology;  Laterality: Left;  . CYSTOSCOPY  WITH RETROGRADE PYELOGRAM, URETEROSCOPY AND STENT PLACEMENT Bilateral 11/19/2013   Procedure: CYSTOSCOPY WITH BILATERAL RETROGRADE PYELOGRAM,;  Surgeon: [name], MD;  [facility];  Service: Urology;  Laterality: Bilateral;  . EYE SURGERY  09/24/10   left eye. "Retina surgery"  . INGUINAL HERNIA REPAIR  1960  . PROSTATE SURGERY     s/p laparoscopic prostate surgery  . SKIN CANCER EXCISION     a/p skin cancer right foot-non melanoma  . TEE WITHOUT CARDIOVERSION N/A 07/29/2015   Procedure: TRANSESOPHAGEAL ECHOCARDIOGRAM (TEE);  Surgeon: Pixie Casino, MD;  Location: [facility] ENDOSCOPY;  Service: Cardiovascular;  Laterality: N/A;  cath to follow  . TRANSURETHRAL RESECTION OF BLADDER TUMOR  10/01/2011   Procedure: TRANSURETHRAL RESECTION OF BLADDER TUMOR (TURBT);  Surgeon: [name], MD;  [facility];  Service: Urology;  Laterality: N/A;  . TRANSURETHRAL RESECTION OF BLADDER TUMOR N/A 11/19/2013   Procedure: TRANSURETHRAL RESECTION OF BLADDER TUMOR (TURBT);  Surgeon: [name], MD;  [facility];  Service: Urology;  Laterality: N/A;  . URETEROSCOPY  01/27/2012   Procedure: URETEROSCOPY;  Surgeon: [name], MD;  [facility];  Service: Urology;  Laterality: Left;  CYSTO, Left RETROGRADE PYELOGRPHY, LEFT URETEROSCOPY   . URINARY SPHINCTER IMPLANT    . VARICOSE VEIN SURGERY      There were no vitals filed for this visit.      Subjective Assessment - 08/11/16 1804    Subjective I want  to review the exercises.  I don't have pain I have discomfort.  What Vania Rea did last time with the pushing, helped.   Currently in Pain? No/denies   Pain Score 0-No pain  not pain,  [place] only   Pain Location Back   Pain Orientation Upper;Mid;Lower   Pain Descriptors / Indicators Discomfort   Pain Type Chronic pain   Pain Frequency Intermittent   Aggravating Factors  walking, getting something off the floor   Pain Relieving Factors sitting,  laying down                          [facility] Adult PT Treatment/Exercise - 08/11/16 0001      Ambulation/Gait   Gait Comments several modification tried,  patient to try a little trunk rotation on his next longer walk.      Exercises   Exercises --  clarified HEP what to do what not to do.      Modalities   Modalities Moist Heat     Moist Heat Therapy   Number Minutes Moist Heat 15 Minutes   Moist Heat Location Lumbar Spine;Cervical  sitting     Manual Therapy   Manual Therapy Soft tissue mobilization   Soft tissue mobilization Back,  instrument assist intermitant with hands,  Tissue softened.                 PT Education - 08/11/16 1809    Education provided Yes   Education Details Gait training   Person(s) Educated Patient   Methods Explanation;Demonstration;Verbal cues   Comprehension Verbalized understanding;Returned demonstration          PT Short Term Goals - 08/11/16 1812      PT SHORT TERM GOAL #1   Title independent with initial home exercise program for flexibility and postural strength   Baseline needs cues/ clarification.  He has too many.   Time 4   Period Weeks   Status On-going     PT SHORT TERM GOAL #2   Title ambulate for 30 minutes with upright posture and pain decreased >/= 25%   Baseline discomfort vs pain.   Time 4   Period Weeks   Status On-going           PT Long Term Goals - 07/19/16 1516      PT LONG TERM GOAL #2   Title ability to walk for 45 minutes with back pain decreased >/= 75% due to improve posture and strength   Time 8   Period Weeks   Status On-going  Nothing consistent               Plan - 08/11/16 1810    Clinical Impression Statement Patient continues to need clarification for HEP.  Green and blue bands issued for self progression.  Gait seemed to visabily improve with cues however he will need to try on a longer walk to see if he is more comfortable.  Manual helpful.  He was able to pick a dime  off the floor without any pain.    PT Next Visit Plan Continue working on postural strength, hip and knee strength, standing posture, joint mobilization to spine,  Soft tissue work.  Ask about walking with trunk rotation.   PT Home Exercise Plan standing hip abduction/ extension, sit to stand, LTR, supine marching   Consulted and Agree with Plan of Care Patient      Patient will benefit from skilled therapeutic intervention  in order to improve the following deficits and impairments:  Pain, Abnormal gait, Decreased mobility, Decreased strength, Postural dysfunction, Difficulty walking, Decreased activity tolerance  Visit Diagnosis: Muscle weakness (generalized)  Abnormal posture  Chronic bilateral low back pain without sciatica  Other abnormalities of gait and mobility     Problem List Patient Active Problem List   Diagnosis Date Noted  . Fatigue 11/04/2015  . CAD (coronary artery disease) 08/01/2015  . Pulmonary hypertension ([place]) 07/24/2015  . Atrial fibrillation ([place]) 07/16/2015  . Aortic insufficiency 01/04/2014  . Squamous cell carcinoma in situ of skin of right lower leg 11/13/2013  . Lentigo maligna of cheek ([place]) 09/19/2013  . Actinic keratosis 11/06/2012  . Macular degeneration 08/10/2012  . Allergic rhinitis 08/10/2012  . Bladder cancer ([place]) 10/19/2011  . Eczema 02/23/2011  . Hyperglycemia 02/11/2011  . Heart murmur 09/10/2010  . ABNORMAL THYROID FUNCTION TESTS 08/05/2009  . ABNORMAL HEART RHYTHMS 07/11/2009  . HYPERLIPIDEMIA 11/28/2008  . CAROTID BRUIT 11/19/2008  . UNSPECIFIED CATARACT 10/17/2008  . PARESTHESIA 12/04/2007  . Varicose veins of bilateral lower extremities with other complications 75/64/3329  . COLONIC POLYPS, HX OF 08/28/2007  . Essential hypertension 02/08/2007  . PROSTATE CANCER, HX OF 11/04/2006  . INGUINAL HERNIA, HX OF 11/04/2006     Jeremy Kelley PTA 08/11/2016, 6:14 PM  [facility] [address] [place], [place], [address] Phone: [phone]   Fax:  [phone]  Name: Jeremy Kelley MRN: [id number] Date of Birth: 08/27/1926

## 2016-08-12 ENCOUNTER — Telehealth: Payer: Self-pay | Admitting: Nurse Practitioner

## 2016-08-12 NOTE — Telephone Encounter (Signed)
Patient walked into our office today and asked to see me. He brought paperwork from last office visit on 3/27 when Lasix was reduced from 40 mg to 20 mg daily. He asks if he needs to increase dose back to 40 mg since his ankles are swelling. In reviewing the note and discussing with Dr. Acie Fredrickson, the dose was reduced based on the patient's report that he was urinating too frequently and ankles did not increase in size when he held the dose completely for 3 days. I checked his BP which was 130/78 mmHg and advised that he may take an additional 20 mg as needed for ankle swelling. He verbalized understanding and agreement and thanked me for helping him. He ambulated out of the office in NAD.

## 2016-08-16 DIAGNOSIS — M7981 Nontraumatic hematoma of soft tissue: Secondary | ICD-10-CM | POA: Diagnosis not present

## 2016-08-17 ENCOUNTER — Ambulatory Visit: Payer: Medicare Other | Admitting: Physical Therapy

## 2016-08-17 ENCOUNTER — Encounter: Payer: Self-pay | Admitting: Physical Therapy

## 2016-08-17 DIAGNOSIS — M6281 Muscle weakness (generalized): Secondary | ICD-10-CM | POA: Diagnosis not present

## 2016-08-17 DIAGNOSIS — R2689 Other abnormalities of gait and mobility: Secondary | ICD-10-CM | POA: Diagnosis not present

## 2016-08-17 DIAGNOSIS — G8929 Other chronic pain: Secondary | ICD-10-CM | POA: Diagnosis not present

## 2016-08-17 DIAGNOSIS — M545 Low back pain: Secondary | ICD-10-CM

## 2016-08-17 DIAGNOSIS — R293 Abnormal posture: Secondary | ICD-10-CM | POA: Diagnosis not present

## 2016-08-17 NOTE — Therapy (Addendum)
[place], [place], [address] Phone: [phone]   Fax:  [phone]  Physical Therapy Treatment / Discharge summary  Patient Details  Name: Jeremy Kelley MRN: [id number] Date of Birth: Oct 22, 1926 Referring Provider: Dr. [name]  Encounter Date: 08/17/2016      PT End of Session - 08/17/16 1506    Visit Number 8   Number of Visits 10   Date for PT Re-Evaluation 09/01/16   Authorization Type medicare g-code 10th visit; Kx modifier 15th   PT Start Time 1405   PT Stop Time 1458   PT Time Calculation (min) 53 min   Activity Tolerance Patient tolerated treatment well   Behavior During Therapy [facility] for tasks assessed/performed      Past Medical History:  Diagnosis Date  . Arthritis   . Carotid artery stenosis 11/2008   bilateral 40-59% stenosis  . Colon polyps   . Diverticulosis   . Hepatitis    doesn't know which type 1969  . Hyperlipidemia   . Hypertension   . Numbness    fingertips  . Phimosis   . Prostate cancer ([place]) 2001  . Urothelial carcinoma of left distal ureter [facility])     Past Surgical History:  Procedure Laterality Date  . BIOPSY  10/01/2011   Procedure: BIOPSY;  Surgeon: [name], MD;  [facility];  Service: Urology;;  biopsy of bladder tumor  . BLADDER SUSPENSION  08/18/10   [name]  . CARDIAC CATHETERIZATION N/A 07/29/2015   Procedure: Right/Left Heart Cath and Coronary Angiography;  Surgeon: Peter M Martinique, MD;  Location: [place] CV LAB;  Service: Cardiovascular;  Laterality: N/A;  . CATARACT EXTRACTION W/ INTRAOCULAR LENS  IMPLANT, BILATERAL Bilateral   . CYSTOSCOPY W/ RETROGRADES  01/27/2012   Procedure: CYSTOSCOPY WITH RETROGRADE PYELOGRAM;  Surgeon: [name], MD;  [facility];  Service: Urology;  Laterality: Left;  . CYSTOSCOPY WITH RETROGRADE PYELOGRAM, URETEROSCOPY AND STENT PLACEMENT Left 11/24/2012   Procedure: CYSTOSCOPY WITH left RETROGRADE  PYELOGRAM, bladder washings, ureteroscopy;  Surgeon: [name], MD;  [facility];  Service: Urology;  Laterality: Left;  . CYSTOSCOPY WITH RETROGRADE PYELOGRAM, URETEROSCOPY AND STENT PLACEMENT Bilateral 11/19/2013   Procedure: CYSTOSCOPY WITH BILATERAL RETROGRADE PYELOGRAM,;  Surgeon: [name], MD;  [facility];  Service: Urology;  Laterality: Bilateral;  . EYE SURGERY  09/24/10   left eye. "Retina surgery"  . INGUINAL HERNIA REPAIR  1960  . PROSTATE SURGERY     s/p laparoscopic prostate surgery  . SKIN CANCER EXCISION     a/p skin cancer right foot-non melanoma  . TEE WITHOUT CARDIOVERSION N/A 07/29/2015   Procedure: TRANSESOPHAGEAL ECHOCARDIOGRAM (TEE);  Surgeon: Pixie Casino, MD;  Location: [facility] ENDOSCOPY;  Service: Cardiovascular;  Laterality: N/A;  cath to follow  . TRANSURETHRAL RESECTION OF BLADDER TUMOR  10/01/2011   Procedure: TRANSURETHRAL RESECTION OF BLADDER TUMOR (TURBT);  Surgeon: [name], MD;  [facility];  Service: Urology;  Laterality: N/A;  . TRANSURETHRAL RESECTION OF BLADDER TUMOR N/A 11/19/2013   Procedure: TRANSURETHRAL RESECTION OF BLADDER TUMOR (TURBT);  Surgeon: [name], MD;  [facility];  Service: Urology;  Laterality: N/A;  . URETEROSCOPY  01/27/2012   Procedure: URETEROSCOPY;  Surgeon: [name], MD;  [facility];  Service: Urology;  Laterality: Left;  CYSTO, Left RETROGRADE PYELOGRPHY, LEFT URETEROSCOPY   . URINARY SPHINCTER IMPLANT    . VARICOSE VEIN SURGERY      There were  no vitals filed for this visit.      Subjective Assessment - 08/17/16 1401    Subjective "I am heading out of town and would like to review the exercises, and my back is doing better"    Currently in Pain? No/denies                         [facility] Adult PT Treatment/Exercise - 08/17/16 1501      Knee/Hip Exercises: Machines for Strengthening   Cybex Knee Extension 1 x 12 15#  only 1 set due to pushing on the hamstrings    Cybex  Leg Press 4 sets x 12 with 25#,  35#, 45#, 65#      Knee/Hip Exercises: Standing   Hip Abduction Stengthening;Both;1 set;10 reps;Knee straight  with red theraband   Hip Extension Stengthening;2 sets;10 reps;Knee straight  with red theraband     Knee/Hip Exercises: Seated   Long Arc Quad AROM;Strengthening;Both;1 set;10 reps  with green theraband   Hamstring Curl Strengthening;Both;1 set;15 reps  green theraband   Sit to Sand 1 set;10 reps;without UE support  with arms cross chest     Manual Therapy   Manual therapy comments manual trigger point release over  Rlumbar paraspinals   Joint Mobilization grade 3 PA on l1-L5, R grade 3 T10-T12 rib mobs                PT Education - 08/17/16 1505    Education provided Yes   Education Details reviewed previously provided HEP and how to progress with theraband. updated for hamsting/ quad strengthening.    Person(s) Educated Patient   Methods Explanation;Verbal cues;Handout   Comprehension Verbalized understanding;Verbal cues required          PT Short Term Goals - 08/11/16 1812      PT SHORT TERM GOAL #1   Title independent with initial home exercise program for flexibility and postural strength   Baseline needs cues/ clarification.  He has too many.   Time 4   Period Weeks   Status On-going     PT SHORT TERM GOAL #2   Title ambulate for 30 minutes with upright posture and pain decreased >/= 25%   Baseline discomfort vs pain.   Time 4   Period Weeks   Status On-going           PT Long Term Goals - 07/19/16 1516      PT LONG TERM GOAL #2   Title ability to walk for 45 minutes with back pain decreased >/= 75% due to improve posture and strength   Time 8   Period Weeks   Status On-going  Nothing consistent               Plan - 08/17/16 1506    Clinical Impression Statement pt reports no pain today in the back. reviewd previously provided HEP at pt request due to going out of town for a couple of  weeks. Reviewed and performed gym exercises that can be performed in his hotel room. continued manual over the low back which he reported decreased pain and tightness. pt declined modalities today.   PT Next Visit Plan Continue working on postural strength, hip and knee strength, standing posture, joint mobilization to spine,  Soft tissue work.    PT Home Exercise Plan standing hip abduction/ extension, sit to stand, LTR, supine marching, knee extension. hamstring curls.   Consulted and Agree with Plan of Care  Patient      Patient will benefit from skilled therapeutic intervention in order to improve the following deficits and impairments:  Pain, Abnormal gait, Decreased mobility, Decreased strength, Postural dysfunction, Difficulty walking, Decreased activity tolerance  Visit Diagnosis: Muscle weakness (generalized)  Abnormal posture  Chronic bilateral low back pain without sciatica  Other abnormalities of gait and mobility     Problem List Patient Active Problem List   Diagnosis Date Noted  . Fatigue 11/04/2015  . CAD (coronary artery disease) 08/01/2015  . Pulmonary hypertension ([place]) 07/24/2015  . Atrial fibrillation ([place]) 07/16/2015  . Aortic insufficiency 01/04/2014  . Squamous cell carcinoma in situ of skin of right lower leg 11/13/2013  . Lentigo maligna of cheek ([place]) 09/19/2013  . Actinic keratosis 11/06/2012  . Macular degeneration 08/10/2012  . Allergic rhinitis 08/10/2012  . Bladder cancer ([place]) 10/19/2011  . Eczema 02/23/2011  . Hyperglycemia 02/11/2011  . Heart murmur 09/10/2010  . ABNORMAL THYROID FUNCTION TESTS 08/05/2009  . ABNORMAL HEART RHYTHMS 07/11/2009  . HYPERLIPIDEMIA 11/28/2008  . CAROTID BRUIT 11/19/2008  . UNSPECIFIED CATARACT 10/17/2008  . PARESTHESIA 12/04/2007  . Varicose veins of bilateral lower extremities with other complications 88/67/7373  . COLONIC POLYPS, HX OF 08/28/2007  . Essential hypertension 02/08/2007  . PROSTATE CANCER, HX  OF 11/04/2006  . INGUINAL HERNIA, HX OF 11/04/2006   Starr Lake PT, DPT, LAT, ATC  08/17/16  3:18 PM      [facility] [address] [place], [place], [address] Phone: [phone]   Fax:  [phone]  Name: YACOB WILKERSON MRN: [id number] Date of Birth: 12-23-1926     PHYSICAL THERAPY DISCHARGE SUMMARY  Visits from Start of Care: 8  Current functional level related to goals / functional outcomes: See goals   Remaining deficits: unknown   Education / Equipment: HEP  Plan: Patient agrees to discharge.  Patient goals were not met. Patient is being discharged due to not returning since the last visit.  ?????     Nate Common PT, DPT, LAT, ATC  10/11/16  1:13 PM

## 2016-08-18 ENCOUNTER — Ambulatory Visit (INDEPENDENT_AMBULATORY_CARE_PROVIDER_SITE_OTHER): Payer: Medicare Other | Admitting: Adult Health

## 2016-08-18 ENCOUNTER — Encounter: Payer: Self-pay | Admitting: Vascular Surgery

## 2016-08-18 VITALS — BP 128/64 | Temp 97.6°F | Ht 70.0 in | Wt 131.7 lb

## 2016-08-18 DIAGNOSIS — E785 Hyperlipidemia, unspecified: Secondary | ICD-10-CM | POA: Diagnosis not present

## 2016-08-18 DIAGNOSIS — Z760 Encounter for issue of repeat prescription: Secondary | ICD-10-CM | POA: Diagnosis not present

## 2016-08-18 DIAGNOSIS — I10 Essential (primary) hypertension: Secondary | ICD-10-CM | POA: Diagnosis not present

## 2016-08-18 DIAGNOSIS — I482 Chronic atrial fibrillation, unspecified: Secondary | ICD-10-CM

## 2016-08-18 DIAGNOSIS — R600 Localized edema: Secondary | ICD-10-CM

## 2016-08-18 MED ORDER — FUROSEMIDE 40 MG PO TABS: 40.0000 mg | ORAL_TABLET | Freq: Every day | ORAL | 3 refills | Status: DC

## 2016-08-18 MED ORDER — B COMPLEX-C PO TABS: 1.0000 | ORAL_TABLET | Freq: Every day | ORAL | 3 refills | Status: DC

## 2016-08-18 MED ORDER — AMLODIPINE BESYLATE 2.5 MG PO TABS: 2.5000 mg | ORAL_TABLET | Freq: Every day | ORAL | 3 refills | Status: DC

## 2016-08-18 MED ORDER — METOPROLOL SUCCINATE ER 25 MG PO TB24: 25.0000 mg | ORAL_TABLET | Freq: Every day | ORAL | 3 refills | Status: DC

## 2016-08-18 MED ORDER — MIRABEGRON ER 50 MG PO TB24: 50.0000 mg | ORAL_TABLET | Freq: Every day | ORAL | 3 refills | Status: DC

## 2016-08-18 MED ORDER — VITAMIN D 1000 UNITS PO TABS: 1000.0000 [IU] | ORAL_TABLET | Freq: Every day | ORAL | 3 refills | Status: AC

## 2016-08-18 MED ORDER — KLOR-CON M10 10 MEQ PO TBCR: 10.0000 meq | EXTENDED_RELEASE_TABLET | Freq: Once | ORAL | 3 refills | Status: DC

## 2016-08-18 MED ORDER — PRAVASTATIN SODIUM 20 MG PO TABS: 20.0000 mg | ORAL_TABLET | Freq: Every day | ORAL | 3 refills | Status: DC

## 2016-08-18 MED ORDER — APIXABAN 2.5 MG PO TABS: 2.5000 mg | ORAL_TABLET | Freq: Two times a day (BID) | ORAL | 3 refills | Status: DC

## 2016-08-18 NOTE — Progress Notes (Signed)
Subjective:    Patient ID: Jeremy Kelley, male    DOB: 09-06-1926, 83 y.o.   MRN: [id number]  HPI  83 year old male who  has a past medical history of Arthritis; Carotid artery stenosis (11/2008); Colon polyps; Diverticulosis; Hepatitis; Hyperlipidemia; Hypertension; Numbness; Phimosis; Prostate cancer ([place]) (2001); and Urothelial carcinoma of left distal ureter ([place]). He presents to the office today for medication review and refill. He is leaving for [place] for the next month and a half and needs 90 day refills on his medications.   He has no acute complaints today    Review of Systems See HPI   Past Medical History:  Diagnosis Date  . Arthritis   . Carotid artery stenosis 11/2008   bilateral 40-59% stenosis  . Colon polyps   . Diverticulosis   . Hepatitis    doesn't know which type 1969  . Hyperlipidemia   . Hypertension   . Numbness    fingertips  . Phimosis   . Prostate cancer ([place]) 2001  . Urothelial carcinoma of left distal ureter [facility])     Social History   Social History  . Marital status: Single    Spouse name: N/A  . Number of children: 0  . Years of education: N/A   Occupational History  . Retired    Social History Main Topics  . Smoking status: Passive Smoke Exposure - Never Smoker    Types: Cigars  . Smokeless tobacco: Never Used     Comment: 07/29/2015 "might smoke a cigar once/year"  . Alcohol use 8.4 oz/week    14 Glasses of wine per week     Comment: 07/29/2015 "big glass of wine q night"  . Drug use: No  . Sexual activity: Not on file   Other Topics Concern  . Not on file   Social History Narrative   Patient is single, has never been married.  Does not have any children.  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   He drinks approximately 3-4 glasses of wine per week.  No history of excessive alcohol use.  Occasionally smokes a cigar.   Spends winters in [place]         Past Surgical History:  Procedure  Laterality Date  . BIOPSY  10/01/2011   Procedure: BIOPSY;  Surgeon: [name], MD;  Location: [facility];  Service: Urology;;  biopsy of bladder tumor  . BLADDER SUSPENSION  08/18/10   [name]  . CARDIAC CATHETERIZATION N/A 07/29/2015   Procedure: Right/Left Heart Cath and Coronary Angiography;  Surgeon: Peter M Martinique, MD;  Location: [facility];  Service: Cardiovascular;  Laterality: N/A;  . CATARACT EXTRACTION W/ INTRAOCULAR LENS  IMPLANT, BILATERAL Bilateral   . CYSTOSCOPY W/ RETROGRADES  01/27/2012   Procedure: CYSTOSCOPY WITH RETROGRADE PYELOGRAM;  Surgeon: [name], MD;  Location: [facility];  Service: Urology;  Laterality: Left;  . CYSTOSCOPY WITH RETROGRADE PYELOGRAM, URETEROSCOPY AND STENT PLACEMENT Left 11/24/2012   Procedure: CYSTOSCOPY WITH left RETROGRADE PYELOGRAM, bladder washings, ureteroscopy;  Surgeon: [name], MD;  Location: [facility];  Service: Urology;  Laterality: Left;  . CYSTOSCOPY WITH RETROGRADE PYELOGRAM, URETEROSCOPY AND STENT PLACEMENT Bilateral 11/19/2013   Procedure: CYSTOSCOPY WITH BILATERAL RETROGRADE PYELOGRAM,;  Surgeon: [name], MD;  Location: [facility];  Service: Urology;  Laterality: Bilateral;  . EYE SURGERY  09/24/10   left eye. "Retina surgery"  . INGUINAL HERNIA REPAIR  1960  . PROSTATE SURGERY  s/p laparoscopic prostate surgery  . SKIN CANCER EXCISION     a/p skin cancer right foot-non melanoma  . TEE WITHOUT CARDIOVERSION N/A 07/29/2015   Procedure: TRANSESOPHAGEAL ECHOCARDIOGRAM (TEE);  Surgeon: Pixie Casino, MD;  Location: [facility] ENDOSCOPY;  Service: Cardiovascular;  Laterality: N/A;  cath to follow  . TRANSURETHRAL RESECTION OF BLADDER TUMOR  10/01/2011   Procedure: TRANSURETHRAL RESECTION OF BLADDER TUMOR (TURBT);  Surgeon: [name], MD;  Location: [facility];  Service: Urology;  Laterality: N/A;  . TRANSURETHRAL RESECTION OF BLADDER TUMOR N/A 11/19/2013   Procedure: TRANSURETHRAL RESECTION OF BLADDER TUMOR (TURBT);  Surgeon: [name],  MD;  Location: [facility];  Service: Urology;  Laterality: N/A;  . URETEROSCOPY  01/27/2012   Procedure: URETEROSCOPY;  Surgeon: [name], MD;  Location: [facility];  Service: Urology;  Laterality: Left;  CYSTO, Left RETROGRADE PYELOGRPHY, LEFT URETEROSCOPY   . URINARY SPHINCTER IMPLANT    . VARICOSE VEIN SURGERY      Family History  Problem Relation Age of Onset  . Colon cancer Neg Hx   . Colon polyps Neg Hx     No Known Allergies  Current Outpatient Prescriptions on File Prior to Visit  Medication Sig Dispense Refill  . methylcellulose (ARTIFICIAL TEARS) 1 % ophthalmic solution Place 1 drop into both eyes 2 (two) times daily.     . Multiple Vitamins-Minerals (PRESERVISION/LUTEIN PO) Take 1 tablet by mouth daily.     No current facility-administered medications on file prior to visit.     BP 128/64 (BP Location: Left Arm, Patient Position: Sitting, Cuff Size: Normal)   Temp 97.6 F (36.4 C) (Oral)   Ht 5\' 10"  (1.778 m)   Wt 131 lb 11.2 oz (59.7 kg)   BMI 18.90 kg/m       Objective:   Physical Exam  Constitutional: He is oriented to person, place, and time. He appears well-developed and well-nourished. No distress.  Cardiovascular: Normal rate and intact distal pulses.  An irregularly irregular rhythm present. Exam reveals no gallop and no friction rub.   Murmur heard. Pulmonary/Chest: Breath sounds normal. No respiratory distress. He has no wheezes. He has no rales. He exhibits no tenderness.  Neurological: He is alert and oriented to person, place, and time.  Skin: Skin is warm and dry. No rash noted. He is not diaphoretic. No erythema. No pallor.  Chronic discoloration to bilateral legs   Psychiatric: He has a normal mood and affect. His behavior is normal. Judgment and thought content normal.  Nursing note and vitals reviewed.     Assessment & Plan:  1. Chronic atrial fibrillation (HCC)  - apixaban (ELIQUIS) 2.5 MG TABS tablet; Take 1 tablet (2.5 mg total) by mouth 2  (two) times daily.  Dispense: 180 tablet; Refill: 3 - metoprolol succinate (TOPROL-XL) 25 MG 24 hr tablet; Take 1 tablet (25 mg total) by mouth at bedtime.  Dispense: 90 tablet; Refill: 3  2. Essential hypertension  - furosemide (LASIX) 40 MG tablet; Take 1 tablet (40 mg total) by mouth daily.  Dispense: 90 tablet; Refill: 3 - amLODipine (NORVASC) 2.5 MG tablet; Take 1 tablet (2.5 mg total) by mouth daily.  Dispense: 90 tablet; Refill: 3 - metoprolol succinate (TOPROL-XL) 25 MG 24 hr tablet; Take 1 tablet (25 mg total) by mouth at bedtime.  Dispense: 90 tablet; Refill: 3  3. Hyperlipidemia, unspecified hyperlipidemia type  - pravastatin (PRAVACHOL) 20 MG tablet; Take 1 tablet (20 mg total) by mouth at bedtime.  Dispense: 90 tablet;  Refill: 3  4. Lower extremity edema  - furosemide (LASIX) 40 MG tablet; Take 1 tablet (40 mg total) by mouth daily.  Dispense: 90 tablet; Refill: 3 - KLOR-CON M10 10 MEQ tablet; Take 1 tablet (10 mEq total) by mouth once.  Dispense: 90 tablet; Refill: 3  5. Medication refill  - furosemide (LASIX) 40 MG tablet; Take 1 tablet (40 mg total) by mouth daily.  Dispense: 90 tablet; Refill: 3 - KLOR-CON M10 10 MEQ tablet; Take 1 tablet (10 mEq total) by mouth once.  Dispense: 90 tablet; Refill: 3 - mirabegron ER (MYRBETRIQ) 50 MG TB24 tablet; Take 1 tablet (50 mg total) by mouth daily.  Dispense: 90 tablet; Refill: 3 - pravastatin (PRAVACHOL) 20 MG tablet; Take 1 tablet (20 mg total) by mouth at bedtime.  Dispense: 90 tablet; Refill: 3 - apixaban (ELIQUIS) 2.5 MG TABS tablet; Take 1 tablet (2.5 mg total) by mouth 2 (two) times daily.  Dispense: 180 tablet; Refill: 3 - amLODipine (NORVASC) 2.5 MG tablet; Take 1 tablet (2.5 mg total) by mouth daily.  Dispense: 90 tablet; Refill: 3 - metoprolol succinate (TOPROL-XL) 25 MG 24 hr tablet; Take 1 tablet (25 mg total) by mouth at bedtime.  Dispense: 90 tablet; Refill: 3 - B Complex-C (B-COMPLEX WITH VITAMIN C) tablet; Take 1  tablet by mouth daily. Reported on 07/24/2015  Dispense: 90 tablet; Refill: 3 - cholecalciferol (VITAMIN D) 1000 units tablet; Take 1 tablet (1,000 Units total) by mouth daily.  Dispense: 90 tablet; Refill: 3  [name], NP

## 2016-08-19 ENCOUNTER — Other Ambulatory Visit: Payer: Self-pay

## 2016-08-19 ENCOUNTER — Ambulatory Visit: Payer: Medicare Other | Admitting: Physical Therapy

## 2016-08-19 DIAGNOSIS — Z760 Encounter for issue of repeat prescription: Secondary | ICD-10-CM

## 2016-08-19 MED ORDER — MIRABEGRON ER 50 MG PO TB24: 50.0000 mg | ORAL_TABLET | Freq: Every day | ORAL | 3 refills | Status: DC

## 2016-08-20 ENCOUNTER — Encounter (HOSPITAL_COMMUNITY): Payer: Medicare Other

## 2016-08-20 ENCOUNTER — Other Ambulatory Visit: Payer: Self-pay | Admitting: *Deleted

## 2016-08-20 ENCOUNTER — Ambulatory Visit (HOSPITAL_COMMUNITY)
Admission: RE | Admit: 2016-08-20 | Discharge: 2016-08-20 | Disposition: A | Discharge status: Home / Self Care | Payer: Medicare Other | Source: Ambulatory Visit | Attending: Vascular Surgery | Admitting: Vascular Surgery

## 2016-08-20 DIAGNOSIS — I8390 Asymptomatic varicose veins of unspecified lower extremity: Secondary | ICD-10-CM | POA: Diagnosis not present

## 2016-08-20 DIAGNOSIS — I83899 Varicose veins of unspecified lower extremities with other complications: Secondary | ICD-10-CM

## 2016-08-23 ENCOUNTER — Ambulatory Visit (HOSPITAL_COMMUNITY)
Admission: RE | Admit: 2016-08-23 | Discharge: 2016-08-23 | Disposition: A | Discharge status: Home / Self Care | Payer: Medicare Other | Source: Ambulatory Visit | Attending: Surgery | Admitting: Surgery

## 2016-08-23 DIAGNOSIS — I83899 Varicose veins of unspecified lower extremities with other complications: Secondary | ICD-10-CM | POA: Diagnosis present

## 2016-08-24 ENCOUNTER — Ambulatory Visit (INDEPENDENT_AMBULATORY_CARE_PROVIDER_SITE_OTHER): Payer: Medicare Other | Admitting: Vascular Surgery

## 2016-08-24 ENCOUNTER — Ambulatory Visit: Payer: Medicare Other | Admitting: Physical Therapy

## 2016-08-24 ENCOUNTER — Encounter: Payer: Self-pay | Admitting: Vascular Surgery

## 2016-08-24 VITALS — BP 122/70 | HR 71 | Temp 97.6°F | Resp 16 | Ht 70.0 in | Wt 127.0 lb

## 2016-08-24 DIAGNOSIS — I83893 Varicose veins of bilateral lower extremities with other complications: Secondary | ICD-10-CM | POA: Diagnosis not present

## 2016-08-24 NOTE — Progress Notes (Signed)
[facility] [place]  Patient name: Jeremy Kelley MRN: [id number] DOB: [birth year] Sex: male  REASON FOR VISIT: Follow-up lower extremity venous pathology  HPI: Jeremy Kelley is a 83 y.o. male here today for discussion of lower from the arterial and venous flow. He is a very active. He walks on a daily basis. He does have marked changes of venous hypertension in both lower extremities with circumferential hemosiderin deposits from just below his knees down to his feet and marked superficial varicosities in telangiectasia bilaterally. Is no history of DVT. He does have some discomfort with walking.  Past Medical History:  Diagnosis Date  . Arthritis   . Carotid artery stenosis 11/2008   bilateral 40-59% stenosis  . Colon polyps   . Diverticulosis   . Hepatitis    doesn't know which type 1969  . Hyperlipidemia   . Hypertension   . Numbness    fingertips  . Phimosis   . Prostate cancer ([place]) 2001  . Urothelial carcinoma of left distal ureter (HCC)     Family History  Problem Relation Age of Onset  . Colon cancer Neg Hx   . Colon polyps Neg Hx     SOCIAL HISTORY: Social History  Substance Use Topics  . Smoking status: Passive Smoke Exposure - Never Smoker    Types: Cigars  . Smokeless tobacco: Never Used     Comment: 07/29/2015 "might smoke a cigar once/year"  . Alcohol use 8.4 oz/week    14 Glasses of wine per week     Comment: 07/29/2015 "big glass of wine q night"    No Known Allergies  Current Outpatient Prescriptions  Medication Sig Dispense Refill  . amLODipine (NORVASC) 2.5 MG tablet Take 1 tablet (2.5 mg total) by mouth daily. 90 tablet 3  . apixaban (ELIQUIS) 2.5 MG TABS tablet Take 1 tablet (2.5 mg total) by mouth 2 (two) times daily. 180 tablet 3  . B Complex-C (B-COMPLEX WITH VITAMIN C) tablet Take 1 tablet by mouth daily. Reported on 07/24/2015 90 tablet 3  . cholecalciferol (VITAMIN D) 1000  units tablet Take 1 tablet (1,000 Units total) by mouth daily. 90 tablet 3  . furosemide (LASIX) 40 MG tablet Take 1 tablet (40 mg total) by mouth daily. 90 tablet 3  . KLOR-CON M10 10 MEQ tablet Take 1 tablet (10 mEq total) by mouth once. 90 tablet 3  . methylcellulose (ARTIFICIAL TEARS) 1 % ophthalmic solution Place 1 drop into both eyes 2 (two) times daily.     . metoprolol succinate (TOPROL-XL) 25 MG 24 hr tablet Take 1 tablet (25 mg total) by mouth at bedtime. 90 tablet 3  . mirabegron ER (MYRBETRIQ) 50 MG TB24 tablet Take 1 tablet (50 mg total) by mouth daily. 90 tablet 3  . Multiple Vitamins-Minerals (PRESERVISION/LUTEIN PO) Take 1 tablet by mouth daily.    . pravastatin (PRAVACHOL) 20 MG tablet Take 1 tablet (20 mg total) by mouth at bedtime. 90 tablet 3   No current facility-administered medications for this visit.     REVIEW OF SYSTEMS:  [X]  denotes positive finding, [ ]  denotes negative finding Cardiac  Comments:  Chest pain or chest pressure:    Shortness of breath upon exertion:    Short of breath when lying flat:    Irregular heart rhythm:        Vascular    Pain in calf, thigh, or hip brought on by ambulation: x   Pain in feet  at night that wakes you up from your sleep:     Blood clot in your veins:    Leg swelling:  x         PHYSICAL EXAM: Vitals:   08/24/16 1139  BP: 122/70  Pulse: 71  Resp: 16  Temp: 97.6 F (36.4 C)  SpO2: 96%  Weight: 127 lb (57.6 kg)  Height: 5\' 10"  (1.778 m)    GENERAL: The patient is a well-nourished male, in no acute distress. The vital signs are documented above. CARDIOVASCULAR: Palpable dorsalis pedis pulses bilaterally. PULMONARY: There is good air exchange  MUSCULOSKELETAL: There are no major deformities or cyanosis. NEUROLOGIC: No focal weakness or paresthesias are detected. SKIN: No skin breakdown but extensive hemosiderin deposit circumferentially from his knees down to his ankles and large varicosities on both lower  extremity which are quite superficial. PSYCHIATRIC: The patient has a normal affect.  DATA:  Noninvasive studies reveal normal triphasic waveforms and normal ankle arm index bilaterally and arterial standpoint.  Lower extremity venous studies from yesterday reveal no DVT within significant reflux with no dilatation and his saphenous veins bilaterally.  MEDICAL ISSUES: Patient reports that he has had prior treatment which sounds like sclerotherapy while in [place] his lower extremities. I do not see any correctable venous hypertension. He is not symptomatic from his varicose veins. He has no evidence of arterial insufficiency. He was reassured with this and will see [place] again on an as-needed basis    Rosetta Posner, MD [facility] Tel [phone] Pager [phone]

## 2016-08-26 ENCOUNTER — Ambulatory Visit: Payer: Medicare Other | Admitting: Physical Therapy

## 2016-08-31 ENCOUNTER — Ambulatory Visit: Payer: Medicare Other | Admitting: Physical Therapy

## 2016-09-02 ENCOUNTER — Ambulatory Visit: Payer: Medicare Other | Admitting: Physical Therapy

## 2016-09-07 ENCOUNTER — Ambulatory Visit: Payer: Medicare Other | Admitting: Physical Therapy

## 2016-09-09 ENCOUNTER — Ambulatory Visit: Payer: Medicare Other | Admitting: Physical Therapy

## 2016-09-22 ENCOUNTER — Emergency Department (HOSPITAL_COMMUNITY): Payer: Medicare Other

## 2016-09-22 ENCOUNTER — Encounter (HOSPITAL_COMMUNITY): Payer: Self-pay | Admitting: Emergency Medicine

## 2016-09-22 ENCOUNTER — Emergency Department (HOSPITAL_COMMUNITY)
Admission: EM | Admit: 2016-09-22 | Discharge: 2016-09-22 | Disposition: A | Discharge status: Home / Self Care | Payer: Medicare Other | Attending: Emergency Medicine | Admitting: Emergency Medicine

## 2016-09-22 DIAGNOSIS — S8992XA Unspecified injury of left lower leg, initial encounter: Secondary | ICD-10-CM | POA: Diagnosis present

## 2016-09-22 DIAGNOSIS — S82392A Other fracture of lower end of left tibia, initial encounter for closed fracture: Secondary | ICD-10-CM | POA: Diagnosis not present

## 2016-09-22 DIAGNOSIS — I2510 Atherosclerotic heart disease of native coronary artery without angina pectoris: Secondary | ICD-10-CM | POA: Diagnosis not present

## 2016-09-22 DIAGNOSIS — Y999 Unspecified external cause status: Secondary | ICD-10-CM | POA: Insufficient documentation

## 2016-09-22 DIAGNOSIS — Y929 Unspecified place or not applicable: Secondary | ICD-10-CM | POA: Diagnosis not present

## 2016-09-22 DIAGNOSIS — I10 Essential (primary) hypertension: Secondary | ICD-10-CM | POA: Diagnosis not present

## 2016-09-22 DIAGNOSIS — S82252A Displaced comminuted fracture of shaft of left tibia, initial encounter for closed fracture: Secondary | ICD-10-CM | POA: Diagnosis not present

## 2016-09-22 DIAGNOSIS — Y939 Activity, unspecified: Secondary | ICD-10-CM | POA: Insufficient documentation

## 2016-09-22 DIAGNOSIS — S82302A Unspecified fracture of lower end of left tibia, initial encounter for closed fracture: Secondary | ICD-10-CM | POA: Insufficient documentation

## 2016-09-22 DIAGNOSIS — Z5189 Encounter for other specified aftercare: Secondary | ICD-10-CM

## 2016-09-22 DIAGNOSIS — Z79899 Other long term (current) drug therapy: Secondary | ICD-10-CM | POA: Diagnosis not present

## 2016-09-22 DIAGNOSIS — Z4801 Encounter for change or removal of surgical wound dressing: Secondary | ICD-10-CM | POA: Diagnosis not present

## 2016-09-22 DIAGNOSIS — Z87891 Personal history of nicotine dependence: Secondary | ICD-10-CM | POA: Insufficient documentation

## 2016-09-22 LAB — I-STAT CG4 LACTIC ACID, ED: Lactic Acid, Venous: 1.16 mmol/L (ref 0.5–1.9)

## 2016-09-22 LAB — CBC WITH DIFFERENTIAL/PLATELET
BASOS ABS: 0 10*3/uL (ref 0.0–0.1)
BASOS PCT: 0 %
EOS PCT: 2 %
Eosinophils Absolute: 0.2 10*3/uL (ref 0.0–0.7)
HCT: 39.9 % (ref 39.0–52.0)
Hemoglobin: 13.2 g/dL (ref 13.0–17.0)
LYMPHS PCT: 21 %
Lymphs Abs: 2.2 10*3/uL (ref 0.7–4.0)
MCH: 32.2 pg (ref 26.0–34.0)
MCHC: 33.1 g/dL (ref 30.0–36.0)
MCV: 97.3 fL (ref 78.0–100.0)
Monocytes Absolute: 0.7 10*3/uL (ref 0.1–1.0)
Monocytes Relative: 7 %
Neutro Abs: 7.3 10*3/uL (ref 1.7–7.7)
Neutrophils Relative %: 70 %
Platelets: 207 10*3/uL (ref 150–400)
RBC: 4.1 MIL/uL — ABNORMAL LOW (ref 4.22–5.81)
RDW: 13.3 % (ref 11.5–15.5)
WBC: 10.3 10*3/uL (ref 4.0–10.5)

## 2016-09-22 LAB — BASIC METABOLIC PANEL
ANION GAP: 5 (ref 5–15)
BUN: 38 mg/dL — ABNORMAL HIGH (ref 6–20)
CO2: 30 mmol/L (ref 22–32)
Calcium: 9.5 mg/dL (ref 8.9–10.3)
Chloride: 106 mmol/L (ref 101–111)
Creatinine, Ser: 1.16 mg/dL (ref 0.61–1.24)
GFR, EST NON AFRICAN AMERICAN: 55 mL/min — AB (ref >60)
Glucose, Bld: 97 mg/dL (ref 65–99)
POTASSIUM: 4.3 mmol/L (ref 3.5–5.1)
SODIUM: 141 mmol/L (ref 135–145)

## 2016-09-22 MED ORDER — SODIUM CHLORIDE 0.9 % IV BOLUS (SEPSIS): 1000.0000 mL | Freq: Once | INTRAVENOUS | Status: DC

## 2016-09-22 MED ORDER — DEXTROSE 5 % IV SOLN
1.0000 g | Freq: Once | INTRAVENOUS | Status: AC
  Administered 2016-09-22: 1 g via INTRAVENOUS
  Filled 2016-09-22: qty 10

## 2016-09-22 NOTE — Discharge Instructions (Signed)
As discussed, contact the wound care clinic to be seen within the next two days. Dr Migdalia Dk in plastic surgery will be contacted from the wound care clinic as need. Her phone number is in this summary as well.  Keep the area clean and dry with bandages. Monitor for any signs of infection, including swelling, redness surrounding the wound, increased pain, fever, chills and return to the ER if experiencing worsening or new symptoms in the meantime.
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
## 2016-09-22 NOTE — ED Notes (Signed)
Pt family request consult to case management  Social work called and Cm notified.
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
## 2016-09-23 ENCOUNTER — Telehealth (INDEPENDENT_AMBULATORY_CARE_PROVIDER_SITE_OTHER): Payer: Self-pay | Admitting: *Deleted

## 2016-09-23 ENCOUNTER — Ambulatory Visit (INDEPENDENT_AMBULATORY_CARE_PROVIDER_SITE_OTHER): Payer: Medicare Other | Admitting: Adult Health

## 2016-09-23 ENCOUNTER — Encounter: Payer: Self-pay | Admitting: Adult Health

## 2016-09-23 VITALS — BP 122/70 | Temp 98.3°F

## 2016-09-23 DIAGNOSIS — S82832S Other fracture of upper and lower end of left fibula, sequela: Secondary | ICD-10-CM

## 2016-09-23 DIAGNOSIS — Z5189 Encounter for other specified aftercare: Secondary | ICD-10-CM
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
## 2016-09-23 NOTE — Telephone Encounter (Signed)
Received vm on Triage phone stating that pt has a fractured ankle and laceration on his shin and would like a boot, I left message on pt vm advising to call office and schedule new pt appt and that we could not just give out a boot.

## 2016-09-24 ENCOUNTER — Encounter (HOSPITAL_BASED_OUTPATIENT_CLINIC_OR_DEPARTMENT_OTHER): Payer: Medicare Other | Attending: Internal Medicine

## 2016-09-24 ENCOUNTER — Ambulatory Visit (INDEPENDENT_AMBULATORY_CARE_PROVIDER_SITE_OTHER): Payer: Medicare Other | Admitting: Family

## 2016-09-24 ENCOUNTER — Telehealth: Payer: Self-pay | Admitting: Adult Health

## 2016-09-24 DIAGNOSIS — S51811A Laceration without foreign body of right forearm, initial encounter: Secondary | ICD-10-CM | POA: Diagnosis not present

## 2016-09-24 DIAGNOSIS — L97825 Non-pressure chronic ulcer of other part of left lower leg with muscle involvement without evidence of necrosis: Secondary | ICD-10-CM | POA: Diagnosis not present

## 2016-09-24 DIAGNOSIS — S82892A Other fracture of left lower leg, initial encounter for closed fracture: Secondary | ICD-10-CM

## 2016-09-24 DIAGNOSIS — I10 Essential (primary) hypertension: Secondary | ICD-10-CM | POA: Diagnosis not present

## 2016-09-24 DIAGNOSIS — L97222 Non-pressure chronic ulcer of left calf with fat layer exposed: Secondary | ICD-10-CM | POA: Insufficient documentation

## 2016-09-24 DIAGNOSIS — I2510 Atherosclerotic heart disease of native coronary artery without angina pectoris: Secondary | ICD-10-CM | POA: Diagnosis not present

## 2016-09-24 DIAGNOSIS — I87312 Chronic venous hypertension (idiopathic) with ulcer of left lower extremity: Secondary | ICD-10-CM | POA: Insufficient documentation

## 2016-09-24 DIAGNOSIS — I872 Venous insufficiency (chronic) (peripheral): Secondary | ICD-10-CM | POA: Diagnosis not present

## 2016-09-24 DIAGNOSIS — L97822 Non-pressure chronic ulcer of other part of left lower leg with fat layer exposed: Secondary | ICD-10-CM | POA: Insufficient documentation

## 2016-09-24 NOTE — Telephone Encounter (Signed)
Pt would like you to call back because he needs to discuss something from his appointment this afternoon at the wound center.
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
## 2016-09-27 DIAGNOSIS — S51811A Laceration without foreign body of right forearm, initial encounter: Secondary | ICD-10-CM | POA: Diagnosis not present

## 2016-09-27 DIAGNOSIS — L97822 Non-pressure chronic ulcer of other part of left lower leg with fat layer exposed: Secondary | ICD-10-CM | POA: Diagnosis not present

## 2016-09-27 DIAGNOSIS — I2510 Atherosclerotic heart disease of native coronary artery without angina pectoris: Secondary | ICD-10-CM | POA: Diagnosis not present

## 2016-09-27 DIAGNOSIS — L97222 Non-pressure chronic ulcer of left calf with fat layer exposed: Secondary | ICD-10-CM | POA: Diagnosis not present

## 2016-09-27 DIAGNOSIS — I10 Essential (primary) hypertension: Secondary | ICD-10-CM | POA: Diagnosis not present

## 2016-09-27 DIAGNOSIS — I87312 Chronic venous hypertension (idiopathic) with ulcer of left lower extremity: Secondary | ICD-10-CM | POA: Diagnosis not present

## 2016-09-27 LAB — CULTURE, BLOOD (ROUTINE X 2)
CULTURE: NO GROWTH
Culture: NO GROWTH
SPECIAL REQUESTS: ADEQUATE

## 2016-09-27 NOTE — Telephone Encounter (Signed)
Pt has appt on tues 6/19

## 2016-09-28 ENCOUNTER — Ambulatory Visit: Payer: Medicare Other | Admitting: Adult Health

## 2016-09-28 ENCOUNTER — Ambulatory Visit (INDEPENDENT_AMBULATORY_CARE_PROVIDER_SITE_OTHER): Payer: Medicare Other | Admitting: Adult Health

## 2016-09-28 ENCOUNTER — Encounter: Payer: Self-pay | Admitting: Adult Health

## 2016-09-28 VITALS — BP 126/74 | Temp 97.7°F | Ht 70.0 in

## 2016-09-28 DIAGNOSIS — Z5189 Encounter for other specified aftercare: Secondary | ICD-10-CM | POA: Diagnosis not present

## 2016-09-28 NOTE — Progress Notes (Signed)
Subjective:    Patient ID: Jeremy Kelley, male    DOB: [birth year], 83 y.o.   MRN: [id number]  HPI  83 year old male who  has a past medical history of Arthritis; Carotid artery stenosis (11/2008); Colon polyps; Diverticulosis; Hepatitis; Hyperlipidemia; Hypertension; Numbness; Phimosis; Prostate cancer ([place]) (2001); and Urothelial carcinoma of left distal ureter ([place]). He presents to the office today for follow up after being seen by wound management. He feels as though thr dressing that is currently in place is too tight.   He has cut part of the bandage with scissors to relieve some of the pressure on his wound   Review of Systems See HPI   Past Medical History:  Diagnosis Date  . Arthritis   . Carotid artery stenosis 11/2008   bilateral 40-59% stenosis  . Colon polyps   . Diverticulosis   . Hepatitis    doesn't know which type 1969  . Hyperlipidemia   . Hypertension   . Numbness    fingertips  . Phimosis   . Prostate cancer ([place]) 2001  . Urothelial carcinoma of left distal ureter [facility])     Social History   Social History  . Marital status: Single    Spouse name: N/A  . Number of children: 0  . Years of education: N/A   Occupational History  . Retired    Social History Main Topics  . Smoking status: Former Research scientist (life sciences)  . Smokeless tobacco: Never Used     Comment: 07/29/2015 "might smoke a cigar once/year"  . Alcohol use 8.4 oz/week    14 Glasses of wine per week     Comment: 07/29/2015 "big glass of wine q night"  . Drug use: No  . Sexual activity: Not on file   Other Topics Concern  . Not on file   Social History Narrative   Patient is single, has never been married.  Does not have any children.  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   He drinks approximately 3-4 glasses of wine per week.  No history of excessive alcohol use.  Occasionally smokes a cigar.   Spends winters in [place]         Past Surgical History:  Procedure  Laterality Date  . BIOPSY  10/01/2011   Procedure: BIOPSY;  Surgeon: [name], MD;  [facility];  Service: Urology;;  biopsy of bladder tumor  . BLADDER SUSPENSION  08/18/10   [name]  . CARDIAC CATHETERIZATION N/A 07/29/2015   Procedure: Right/Left Heart Cath and Coronary Angiography;  Surgeon: Peter M Martinique, MD;  Location: [place];  Service: Cardiovascular;  Laterality: N/A;  . CATARACT EXTRACTION W/ INTRAOCULAR LENS  IMPLANT, BILATERAL Bilateral   . CYSTOSCOPY W/ RETROGRADES  01/27/2012   Procedure: CYSTOSCOPY WITH RETROGRADE PYELOGRAM;  Surgeon: [name], MD;  [facility];  Service: Urology;  Laterality: Left;  . CYSTOSCOPY WITH RETROGRADE PYELOGRAM, URETEROSCOPY AND STENT PLACEMENT Left 11/24/2012   Procedure: CYSTOSCOPY WITH left RETROGRADE PYELOGRAM, bladder washings, ureteroscopy;  Surgeon: [name], MD;  [facility];  Service: Urology;  Laterality: Left;  . CYSTOSCOPY WITH RETROGRADE PYELOGRAM, URETEROSCOPY AND STENT PLACEMENT Bilateral 11/19/2013   Procedure: CYSTOSCOPY WITH BILATERAL RETROGRADE PYELOGRAM,;  Surgeon: [name], MD;  [facility];  Service: Urology;  Laterality: Bilateral;  . EYE SURGERY  09/24/10   left eye. "Retina surgery"  . INGUINAL HERNIA REPAIR  1960  . PROSTATE SURGERY     s/p  laparoscopic prostate surgery  . SKIN CANCER EXCISION     a/p skin cancer right foot-non melanoma  . TEE WITHOUT CARDIOVERSION N/A 07/29/2015   Procedure: TRANSESOPHAGEAL ECHOCARDIOGRAM (TEE);  Surgeon: Pixie Casino, MD;  Location: [facility]OSCOPY;  Service: Cardiovascular;  Laterality: N/A;  cath to follow  . TRANSURETHRAL RESECTION OF BLADDER TUMOR  10/01/2011   Procedure: TRANSURETHRAL RESECTION OF BLADDER TUMOR (TURBT);  Surgeon: [name], MD;  [facility];  Service: Urology;  Laterality: N/A;  . TRANSURETHRAL RESECTION OF BLADDER TUMOR N/A 11/19/2013   Procedure: TRANSURETHRAL RESECTION OF BLADDER TUMOR (TURBT);  Surgeon: [name],  MD;  [facility];  Service: Urology;  Laterality: N/A;  . URETEROSCOPY  01/27/2012   Procedure: URETEROSCOPY;  Surgeon: [name], MD;  [facility];  Service: Urology;  Laterality: Left;  CYSTO, Left RETROGRADE PYELOGRPHY, LEFT URETEROSCOPY   . URINARY SPHINCTER IMPLANT    . VARICOSE VEIN SURGERY      Family History  Problem Relation Age of Onset  . Colon cancer Neg Hx   . Colon polyps Neg Hx     No Known Allergies  Current Outpatient Prescriptions on File Prior to Visit  Medication Sig Dispense Refill  . amLODipine (NORVASC) 2.5 MG tablet Take 1 tablet (2.5 mg total) by mouth daily. 90 tablet 3  . apixaban (ELIQUIS) 2.5 MG TABS tablet Take 1 tablet (2.5 mg total) by mouth 2 (two) times daily. 180 tablet 3  . B Complex-C (B-COMPLEX WITH VITAMIN C) tablet Take 1 tablet by mouth daily. Reported on 07/24/2015 90 tablet 3  . cholecalciferol (VITAMIN D) 1000 units tablet Take 1 tablet (1,000 Units total) by mouth daily. 90 tablet 3  . diclofenac (VOLTAREN) 50 MG EC tablet Take 50 mg by mouth 2 (two) times daily.    . furosemide (LASIX) 40 MG tablet Take 1 tablet (40 mg total) by mouth daily. 90 tablet 3  . methylcellulose (ARTIFICIAL TEARS) 1 % ophthalmic solution Place 1 drop into both eyes 2 (two) times daily.     . metoprolol succinate (TOPROL-XL) 25 MG 24 hr tablet Take 1 tablet (25 mg total) by mouth at bedtime. 90 tablet 3  . mirabegron ER (MYRBETRIQ) 50 MG TB24 tablet Take 1 tablet (50 mg total) by mouth daily. 90 tablet 3  . Multiple Vitamins-Minerals (PRESERVISION/LUTEIN PO) Take 1 tablet by mouth daily.    . pravastatin (PRAVACHOL) 20 MG tablet Take 1 tablet (20 mg total) by mouth at bedtime. 90 tablet 3  . KLOR-CON M10 10 MEQ tablet Take 1 tablet (10 mEq total) by mouth once. 90 tablet 3   No current facility-administered medications on file prior to visit.     BP 126/74 (BP Location: Left Arm, Patient Position: Sitting, Cuff Size: Normal)   Temp 97.7 F (36.5 C)  (Oral)   Ht 5\' 10"  (1.778 m)       Objective:   Physical Exam  Constitutional: He is oriented to person, place, and time. He appears well-developed and well-nourished. No distress.  Musculoskeletal:  Does not appear to be any worsening venous insufficiency. He has decent cap refill. Toes and discolored ( chronic)  Neurological: He is alert and oriented to person, place, and time.  Skin: Skin is warm and dry. No rash noted. He is not diaphoretic. No erythema. No pallor.  Psychiatric: He has a normal mood and affect. His behavior is normal. Judgment and thought content normal.  Nursing note and vitals reviewed.  Assessment & Plan:  1. Encounter for wound care - Dressing clean and intact - Took coban off and rewrapped.  - Follow up with wound care  [name], NP

## 2016-10-01 DIAGNOSIS — S51811A Laceration without foreign body of right forearm, initial encounter: Secondary | ICD-10-CM | POA: Diagnosis not present

## 2016-10-01 DIAGNOSIS — L97825 Non-pressure chronic ulcer of other part of left lower leg with muscle involvement without evidence of necrosis: Secondary | ICD-10-CM | POA: Diagnosis not present

## 2016-10-01 DIAGNOSIS — I10 Essential (primary) hypertension: Secondary | ICD-10-CM | POA: Diagnosis not present

## 2016-10-01 DIAGNOSIS — L97222 Non-pressure chronic ulcer of left calf with fat layer exposed: Secondary | ICD-10-CM | POA: Diagnosis not present

## 2016-10-01 DIAGNOSIS — L97822 Non-pressure chronic ulcer of other part of left lower leg with fat layer exposed: Secondary | ICD-10-CM | POA: Diagnosis not present

## 2016-10-01 DIAGNOSIS — I87312 Chronic venous hypertension (idiopathic) with ulcer of left lower extremity: Secondary | ICD-10-CM | POA: Diagnosis not present

## 2016-10-01 DIAGNOSIS — I2510 Atherosclerotic heart disease of native coronary artery without angina pectoris: Secondary | ICD-10-CM | POA: Diagnosis not present

## 2016-10-04 ENCOUNTER — Ambulatory Visit (INDEPENDENT_AMBULATORY_CARE_PROVIDER_SITE_OTHER): Payer: Medicare Other | Admitting: Orthopedic Surgery

## 2016-10-04 ENCOUNTER — Ambulatory Visit (INDEPENDENT_AMBULATORY_CARE_PROVIDER_SITE_OTHER): Payer: Medicare Other

## 2016-10-04 ENCOUNTER — Encounter (INDEPENDENT_AMBULATORY_CARE_PROVIDER_SITE_OTHER): Payer: Self-pay | Admitting: Family

## 2016-10-04 VITALS — Ht 70.0 in | Wt 140.0 lb

## 2016-10-04 DIAGNOSIS — M25572 Pain in left ankle and joints of left foot: Secondary | ICD-10-CM

## 2016-10-04 DIAGNOSIS — I87332 Chronic venous hypertension (idiopathic) with ulcer and inflammation of left lower extremity: Secondary | ICD-10-CM | POA: Insufficient documentation

## 2016-10-04 DIAGNOSIS — L97929 Non-pressure chronic ulcer of unspecified part of left lower leg with unspecified severity: Secondary | ICD-10-CM

## 2016-10-04 HISTORY — DX: Non-pressure chronic ulcer of unspecified part of left lower leg with unspecified severity: L97.929

## 2016-10-04 HISTORY — DX: Pain in left ankle and joints of left foot: M25.572

## 2016-10-04 HISTORY — DX: Chronic venous hypertension (idiopathic) with ulcer and inflammation of left lower extremity: I87.332
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
## 2016-10-07 DIAGNOSIS — I2510 Atherosclerotic heart disease of native coronary artery without angina pectoris: Secondary | ICD-10-CM | POA: Diagnosis not present

## 2016-10-07 DIAGNOSIS — I87312 Chronic venous hypertension (idiopathic) with ulcer of left lower extremity: Secondary | ICD-10-CM | POA: Diagnosis not present

## 2016-10-07 DIAGNOSIS — L97222 Non-pressure chronic ulcer of left calf with fat layer exposed: Secondary | ICD-10-CM | POA: Diagnosis not present

## 2016-10-07 DIAGNOSIS — I10 Essential (primary) hypertension: Secondary | ICD-10-CM | POA: Diagnosis not present

## 2016-10-07 DIAGNOSIS — S51811A Laceration without foreign body of right forearm, initial encounter: Secondary | ICD-10-CM | POA: Diagnosis not present

## 2016-10-07 DIAGNOSIS — L97822 Non-pressure chronic ulcer of other part of left lower leg with fat layer exposed: Secondary | ICD-10-CM | POA: Diagnosis not present

## 2016-10-11 ENCOUNTER — Ambulatory Visit (INDEPENDENT_AMBULATORY_CARE_PROVIDER_SITE_OTHER): Payer: Medicare Other | Admitting: Orthopedic Surgery

## 2016-10-11 ENCOUNTER — Ambulatory Visit (INDEPENDENT_AMBULATORY_CARE_PROVIDER_SITE_OTHER): Payer: Medicare Other

## 2016-10-11 ENCOUNTER — Encounter (INDEPENDENT_AMBULATORY_CARE_PROVIDER_SITE_OTHER): Payer: Self-pay | Admitting: Orthopedic Surgery

## 2016-10-11 VITALS — Ht 70.0 in | Wt 140.0 lb

## 2016-10-11 DIAGNOSIS — M25572 Pain in left ankle and joints of left foot: Secondary | ICD-10-CM

## 2016-10-11 DIAGNOSIS — S82872S Displaced pilon fracture of left tibia, sequela: Secondary | ICD-10-CM | POA: Diagnosis not present

## 2016-10-11 HISTORY — DX: Displaced pilon fracture of left tibia, sequela: S82.872S
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
## 2016-10-12 ENCOUNTER — Encounter: Payer: Self-pay | Admitting: Adult Health

## 2016-10-12 ENCOUNTER — Ambulatory Visit (INDEPENDENT_AMBULATORY_CARE_PROVIDER_SITE_OTHER): Payer: Medicare Other | Admitting: Adult Health

## 2016-10-12 VITALS — BP 132/58 | HR 73 | Temp 98.1°F | Ht 70.0 in | Wt 125.4 lb

## 2016-10-12 DIAGNOSIS — N478 Other disorders of prepuce: Secondary | ICD-10-CM

## 2016-10-12 NOTE — Progress Notes (Signed)
Subjective:    Patient ID: Jeremy Kelley, male    DOB: 1927/01/23, 83 y.o.   MRN: [id number]  HPI  83 year old male who presents to the office today for the complaint of " irritation to his foreskin." He reports that lately he has been cleaning his foreskin with witch hazel solution and that his foreskin has become red and irritated.   He denies any odor or discharge from his penis   Review of Systems See HPI   Past Medical History:  Diagnosis Date  . Arthritis   . Carotid artery stenosis 11/2008   bilateral 40-59% stenosis  . Colon polyps   . Diverticulosis   . Hepatitis    doesn't know which type 1969  . Hyperlipidemia   . Hypertension   . Numbness    fingertips  . Phimosis   . Prostate cancer ([place]) 2001  . Urothelial carcinoma of left distal ureter [facility])     Social History   Social History  . Marital status: Single    Spouse name: N/A  . Number of children: 0  . Years of education: N/A   Occupational History  . Retired    Social History Main Topics  . Smoking status: Former Research scientist (life sciences)  . Smokeless tobacco: Never Used     Comment: 07/29/2015 "might smoke a cigar once/year"  . Alcohol use 8.4 oz/week    14 Glasses of wine per week     Comment: 07/29/2015 "big glass of wine q night"  . Drug use: No  . Sexual activity: Not on file   Other Topics Concern  . Not on file   Social History Narrative   Patient is single, has never been married.  Does not have any children.  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   He drinks approximately 3-4 glasses of wine per week.  No history of excessive alcohol use.  Occasionally smokes a cigar.   Spends winters in [place]         Past Surgical History:  Procedure Laterality Date  . BIOPSY  10/01/2011   Procedure: BIOPSY;  Surgeon: [name], MD;  [facility];  Service: Urology;;  biopsy of bladder tumor  . BLADDER SUSPENSION  08/18/10   [name]  . CARDIAC CATHETERIZATION N/A 07/29/2015   Procedure: Right/Left Heart Cath and Coronary Angiography;  Surgeon: Peter M Martinique, MD;  Location: [place] [facility];  Service: Cardiovascular;  Laterality: N/A;  . CATARACT EXTRACTION W/ INTRAOCULAR LENS  IMPLANT, BILATERAL Bilateral   . CYSTOSCOPY W/ RETROGRADES  01/27/2012   Procedure: CYSTOSCOPY WITH RETROGRADE PYELOGRAM;  Surgeon: [name], MD;  [facility];  Service: Urology;  Laterality: Left;  . CYSTOSCOPY WITH RETROGRADE PYELOGRAM, URETEROSCOPY AND STENT PLACEMENT Left 11/24/2012   Procedure: CYSTOSCOPY WITH left RETROGRADE PYELOGRAM, bladder washings, ureteroscopy;  Surgeon: [name], MD;  [facility];  Service: Urology;  Laterality: Left;  . CYSTOSCOPY WITH RETROGRADE PYELOGRAM, URETEROSCOPY AND STENT PLACEMENT Bilateral 11/19/2013   Procedure: CYSTOSCOPY WITH BILATERAL RETROGRADE PYELOGRAM,;  Surgeon: [name], MD;  [facility];  Service: Urology;  Laterality: Bilateral;  . EYE SURGERY  09/24/10   left eye. "Retina surgery"  . INGUINAL HERNIA REPAIR  1960  . PROSTATE SURGERY     s/p laparoscopic prostate surgery  . SKIN CANCER EXCISION     a/p skin cancer right foot-non melanoma  . TEE WITHOUT CARDIOVERSION N/A 07/29/2015   Procedure: TRANSESOPHAGEAL ECHOCARDIOGRAM (TEE);  Surgeon: Chrissie Noa  Wells Guiles, MD;  Location: [facility];  Service: Cardiovascular;  Laterality: N/A;  cath to follow  . TRANSURETHRAL RESECTION OF BLADDER TUMOR  10/01/2011   Procedure: TRANSURETHRAL RESECTION OF BLADDER TUMOR (TURBT);  Surgeon: [name], MD;  [facility];  Service: Urology;  Laterality: N/A;  . TRANSURETHRAL RESECTION OF BLADDER TUMOR N/A 11/19/2013   Procedure: TRANSURETHRAL RESECTION OF BLADDER TUMOR (TURBT);  Surgeon: [name], MD;  [facility];  Service: Urology;  Laterality: N/A;  . URETEROSCOPY  01/27/2012   Procedure: URETEROSCOPY;  Surgeon: [name], MD;  Location[facility];  Service: Urology;  Laterality: Left;  CYSTO, Left RETROGRADE PYELOGRPHY, LEFT  URETEROSCOPY   . URINARY SPHINCTER IMPLANT    . VARICOSE VEIN SURGERY      Family History  Problem Relation Age of Onset  . Colon cancer Neg Hx   . Colon polyps Neg Hx     No Known Allergies  Current Outpatient Prescriptions on File Prior to Visit  Medication Sig Dispense Refill  . amLODipine (NORVASC) 2.5 MG tablet Take 1 tablet (2.5 mg total) by mouth daily. 90 tablet 3  . apixaban (ELIQUIS) 2.5 MG TABS tablet Take 1 tablet (2.5 mg total) by mouth 2 (two) times daily. 180 tablet 3  . B Complex-C (B-COMPLEX WITH VITAMIN C) tablet Take 1 tablet by mouth daily. Reported on 07/24/2015 90 tablet 3  . cholecalciferol (VITAMIN D) 1000 units tablet Take 1 tablet (1,000 Units total) by mouth daily. 90 tablet 3  . diclofenac (VOLTAREN) 50 MG EC tablet Take 50 mg by mouth 2 (two) times daily.    . furosemide (LASIX) 40 MG tablet Take 1 tablet (40 mg total) by mouth daily. 90 tablet 3  . methylcellulose (ARTIFICIAL TEARS) 1 % ophthalmic solution Place 1 drop into both eyes 2 (two) times daily.     . metoprolol succinate (TOPROL-XL) 25 MG 24 hr tablet Take 1 tablet (25 mg total) by mouth at bedtime. 90 tablet 3  . mirabegron ER (MYRBETRIQ) 50 MG TB24 tablet Take 1 tablet (50 mg total) by mouth daily. 90 tablet 3  . Multiple Vitamins-Minerals (PRESERVISION/LUTEIN PO) Take 1 tablet by mouth daily.    . pravastatin (PRAVACHOL) 20 MG tablet Take 1 tablet (20 mg total) by mouth at bedtime. 90 tablet 3  . KLOR-CON M10 10 MEQ tablet Take 1 tablet (10 mEq total) by mouth once. 90 tablet 3   No current facility-administered medications on file prior to visit.     BP (!) 132/58 (BP Location: Left Arm, Patient Position: Sitting, Cuff Size: Normal)   Pulse 73   Temp 98.1 F (36.7 C) (Oral)   Ht 5\' 10"  (1.778 m)   Wt 125 lb 6.4 oz (56.9 kg)   SpO2 98%   BMI 17.99 kg/m       Objective:   Physical Exam  Constitutional: He is oriented to person, place, and time. He appears well-developed and  well-nourished. No distress.  Cardiovascular: Normal rate, regular rhythm, normal heart sounds and intact distal pulses.  Exam reveals no gallop and no friction rub.   No murmur heard. Pulmonary/Chest: Effort normal and breath sounds normal. No respiratory distress. He has no wheezes. He has no rales. He exhibits no tenderness.  Neurological: He is alert and oriented to person, place, and time.  Skin: Skin is warm and dry. No rash noted. He is not diaphoretic. No erythema. No pallor.  Mild irritation to foreskin. Cracks noted in skin. Skin appears dry  and crusted. No signs of fungal infection noted.   Psychiatric: He has a normal mood and affect. His behavior is normal. Judgment and thought content normal.  Nursing note and vitals reviewed.     Assessment & Plan:  1. Foreskin problem - Stop using witch hazel  - Can use OTC hydrocortisone cream - thin layer morning and night. Wash well with dove soap and warm water.  - Follow up if no improvement or if symptoms worsen   [name], NP

## 2016-10-14 ENCOUNTER — Encounter (HOSPITAL_BASED_OUTPATIENT_CLINIC_OR_DEPARTMENT_OTHER): Payer: Medicare Other | Attending: Internal Medicine

## 2016-10-14 DIAGNOSIS — I87312 Chronic venous hypertension (idiopathic) with ulcer of left lower extremity: Secondary | ICD-10-CM | POA: Diagnosis not present

## 2016-10-14 DIAGNOSIS — I10 Essential (primary) hypertension: Secondary | ICD-10-CM | POA: Insufficient documentation

## 2016-10-14 DIAGNOSIS — L97222 Non-pressure chronic ulcer of left calf with fat layer exposed: Secondary | ICD-10-CM | POA: Diagnosis not present

## 2016-10-14 DIAGNOSIS — L97823 Non-pressure chronic ulcer of other part of left lower leg with necrosis of muscle: Secondary | ICD-10-CM | POA: Diagnosis not present

## 2016-10-14 DIAGNOSIS — L97825 Non-pressure chronic ulcer of other part of left lower leg with muscle involvement without evidence of necrosis: Secondary | ICD-10-CM | POA: Diagnosis not present

## 2016-10-14 DIAGNOSIS — I890 Lymphedema, not elsewhere classified: Secondary | ICD-10-CM | POA: Diagnosis not present

## 2016-10-14 DIAGNOSIS — S51811A Laceration without foreign body of right forearm, initial encounter: Secondary | ICD-10-CM | POA: Diagnosis not present

## 2016-10-14 DIAGNOSIS — L97812 Non-pressure chronic ulcer of other part of right lower leg with fat layer exposed: Secondary | ICD-10-CM | POA: Diagnosis not present

## 2016-10-14 DIAGNOSIS — I2510 Atherosclerotic heart disease of native coronary artery without angina pectoris: Secondary | ICD-10-CM | POA: Diagnosis not present

## 2016-10-20 ENCOUNTER — Other Ambulatory Visit: Payer: Self-pay | Admitting: Adult Health

## 2016-10-20 DIAGNOSIS — Z760 Encounter for issue of repeat prescription: Secondary | ICD-10-CM

## 2016-10-20 DIAGNOSIS — I482 Chronic atrial fibrillation, unspecified: Secondary | ICD-10-CM

## 2016-10-20 MED ORDER — APIXABAN 2.5 MG PO TABS: 2.5000 mg | ORAL_TABLET | Freq: Two times a day (BID) | ORAL | 3 refills | Status: DC

## 2016-10-21 DIAGNOSIS — I890 Lymphedema, not elsewhere classified: Secondary | ICD-10-CM | POA: Diagnosis not present

## 2016-10-21 DIAGNOSIS — L97812 Non-pressure chronic ulcer of other part of right lower leg with fat layer exposed: Secondary | ICD-10-CM | POA: Diagnosis not present

## 2016-10-21 DIAGNOSIS — L97822 Non-pressure chronic ulcer of other part of left lower leg with fat layer exposed: Secondary | ICD-10-CM | POA: Diagnosis not present

## 2016-10-21 DIAGNOSIS — I87312 Chronic venous hypertension (idiopathic) with ulcer of left lower extremity: Secondary | ICD-10-CM | POA: Diagnosis not present

## 2016-10-21 DIAGNOSIS — L97825 Non-pressure chronic ulcer of other part of left lower leg with muscle involvement without evidence of necrosis: Secondary | ICD-10-CM | POA: Diagnosis not present

## 2016-10-21 DIAGNOSIS — I10 Essential (primary) hypertension: Secondary | ICD-10-CM | POA: Diagnosis not present

## 2016-10-21 DIAGNOSIS — L97222 Non-pressure chronic ulcer of left calf with fat layer exposed: Secondary | ICD-10-CM | POA: Diagnosis not present

## 2016-10-21 DIAGNOSIS — S51811A Laceration without foreign body of right forearm, initial encounter: Secondary | ICD-10-CM | POA: Diagnosis not present

## 2016-10-21 DIAGNOSIS — I2510 Atherosclerotic heart disease of native coronary artery without angina pectoris: Secondary | ICD-10-CM | POA: Diagnosis not present

## 2016-10-26 ENCOUNTER — Ambulatory Visit: Payer: Medicare Other | Admitting: Adult Health

## 2016-10-27 ENCOUNTER — Ambulatory Visit: Payer: Medicare Other | Admitting: Cardiovascular Disease

## 2016-10-27 DIAGNOSIS — L570 Actinic keratosis: Secondary | ICD-10-CM | POA: Diagnosis not present

## 2016-10-28 ENCOUNTER — Encounter (INDEPENDENT_AMBULATORY_CARE_PROVIDER_SITE_OTHER): Payer: Self-pay

## 2016-10-28 ENCOUNTER — Ambulatory Visit (INDEPENDENT_AMBULATORY_CARE_PROVIDER_SITE_OTHER): Payer: Medicare Other | Admitting: Cardiovascular Disease

## 2016-10-28 ENCOUNTER — Encounter: Payer: Self-pay | Admitting: Cardiovascular Disease

## 2016-10-28 VITALS — BP 110/62 | HR 73 | Ht 70.0 in | Wt 128.1 lb

## 2016-10-28 DIAGNOSIS — E782 Mixed hyperlipidemia: Secondary | ICD-10-CM

## 2016-10-28 DIAGNOSIS — I351 Nonrheumatic aortic (valve) insufficiency: Secondary | ICD-10-CM | POA: Diagnosis not present
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
## 2016-10-28 NOTE — Patient Instructions (Addendum)
Medication Instructions:  Your physician recommends that you continue on your current medications as directed. Please refer to the Current Medication list given to you today.   Labwork: Your physician recommends that have lab work on the same day as your appointment You will need to FAST for this appointment - nothing to eat or drink after midnight the night before except water.    Testing/Procedures: None Ordered   Follow-Up: Your physician recommends that you return for a follow-up appointment October 23 at 2:20    If you need a refill on your cardiac medications before your next appointment, please call your pharmacy.   Thank you for choosing [facility]! Christen Bame, RN [phone]

## 2016-10-29 DIAGNOSIS — L97822 Non-pressure chronic ulcer of other part of left lower leg with fat layer exposed: Secondary | ICD-10-CM | POA: Diagnosis not present

## 2016-10-29 DIAGNOSIS — L97825 Non-pressure chronic ulcer of other part of left lower leg with muscle involvement without evidence of necrosis: Secondary | ICD-10-CM | POA: Diagnosis not present

## 2016-10-29 DIAGNOSIS — I10 Essential (primary) hypertension: Secondary | ICD-10-CM | POA: Diagnosis not present

## 2016-10-29 DIAGNOSIS — I890 Lymphedema, not elsewhere classified: Secondary | ICD-10-CM | POA: Diagnosis not present

## 2016-10-29 DIAGNOSIS — I87312 Chronic venous hypertension (idiopathic) with ulcer of left lower extremity: Secondary | ICD-10-CM | POA: Diagnosis not present

## 2016-10-29 DIAGNOSIS — L97229 Non-pressure chronic ulcer of left calf with unspecified severity: Secondary | ICD-10-CM | POA: Diagnosis not present

## 2016-10-29 DIAGNOSIS — L97812 Non-pressure chronic ulcer of other part of right lower leg with fat layer exposed: Secondary | ICD-10-CM | POA: Diagnosis not present

## 2016-10-29 DIAGNOSIS — I2510 Atherosclerotic heart disease of native coronary artery without angina pectoris: Secondary | ICD-10-CM | POA: Diagnosis not present

## 2016-10-29 DIAGNOSIS — S51811A Laceration without foreign body of right forearm, initial encounter: Secondary | ICD-10-CM | POA: Diagnosis not present

## 2016-11-01 ENCOUNTER — Ambulatory Visit (INDEPENDENT_AMBULATORY_CARE_PROVIDER_SITE_OTHER): Payer: Medicare Other | Admitting: Orthopedic Surgery

## 2016-11-01 ENCOUNTER — Encounter (INDEPENDENT_AMBULATORY_CARE_PROVIDER_SITE_OTHER): Payer: Self-pay | Admitting: Orthopedic Surgery

## 2016-11-01 ENCOUNTER — Ambulatory Visit (INDEPENDENT_AMBULATORY_CARE_PROVIDER_SITE_OTHER): Payer: Medicare Other

## 2016-11-01 DIAGNOSIS — S82872S Displaced pilon fracture of left tibia, sequela: Secondary | ICD-10-CM

## 2016-11-01 DIAGNOSIS — L97929 Non-pressure chronic ulcer of unspecified part of left lower leg with unspecified severity: Secondary | ICD-10-CM

## 2016-11-01 DIAGNOSIS — I87332 Chronic venous hypertension (idiopathic) with ulcer and inflammation of left lower extremity: Secondary | ICD-10-CM

## 2016-11-01 DIAGNOSIS — M25572 Pain in left ankle and joints of left foot: Secondary | ICD-10-CM | POA: Diagnosis not present
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
## 2016-11-02 ENCOUNTER — Ambulatory Visit: Payer: Medicare Other | Admitting: Adult Health

## 2016-11-03 ENCOUNTER — Encounter: Payer: Self-pay | Admitting: Adult Health

## 2016-11-03 ENCOUNTER — Ambulatory Visit (INDEPENDENT_AMBULATORY_CARE_PROVIDER_SITE_OTHER): Payer: Medicare Other | Admitting: Adult Health

## 2016-11-03 VITALS — BP 112/62 | HR 64 | Temp 97.5°F | Ht 70.0 in | Wt 126.0 lb

## 2016-11-03 DIAGNOSIS — Z5189 Encounter for other specified aftercare: Secondary | ICD-10-CM | POA: Diagnosis not present
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
## 2016-11-05 DIAGNOSIS — L97229 Non-pressure chronic ulcer of left calf with unspecified severity: Secondary | ICD-10-CM | POA: Diagnosis not present

## 2016-11-05 DIAGNOSIS — S51811D Laceration without foreign body of right forearm, subsequent encounter: Secondary | ICD-10-CM | POA: Diagnosis not present

## 2016-11-05 DIAGNOSIS — I2510 Atherosclerotic heart disease of native coronary artery without angina pectoris: Secondary | ICD-10-CM | POA: Diagnosis not present

## 2016-11-05 DIAGNOSIS — I10 Essential (primary) hypertension: Secondary | ICD-10-CM | POA: Diagnosis not present

## 2016-11-05 DIAGNOSIS — I890 Lymphedema, not elsewhere classified: Secondary | ICD-10-CM | POA: Diagnosis not present

## 2016-11-05 DIAGNOSIS — L97825 Non-pressure chronic ulcer of other part of left lower leg with muscle involvement without evidence of necrosis: Secondary | ICD-10-CM | POA: Diagnosis not present

## 2016-11-05 DIAGNOSIS — I87312 Chronic venous hypertension (idiopathic) with ulcer of left lower extremity: Secondary | ICD-10-CM | POA: Diagnosis not present

## 2016-11-05 DIAGNOSIS — L97812 Non-pressure chronic ulcer of other part of right lower leg with fat layer exposed: Secondary | ICD-10-CM | POA: Diagnosis not present

## 2016-11-08 DIAGNOSIS — I2510 Atherosclerotic heart disease of native coronary artery without angina pectoris: Secondary | ICD-10-CM | POA: Diagnosis not present

## 2016-11-08 DIAGNOSIS — L97825 Non-pressure chronic ulcer of other part of left lower leg with muscle involvement without evidence of necrosis: Secondary | ICD-10-CM | POA: Diagnosis not present

## 2016-11-08 DIAGNOSIS — L97812 Non-pressure chronic ulcer of other part of right lower leg with fat layer exposed: Secondary | ICD-10-CM | POA: Diagnosis not present

## 2016-11-08 DIAGNOSIS — I87312 Chronic venous hypertension (idiopathic) with ulcer of left lower extremity: Secondary | ICD-10-CM | POA: Diagnosis not present

## 2016-11-08 DIAGNOSIS — I890 Lymphedema, not elsewhere classified: Secondary | ICD-10-CM | POA: Diagnosis not present

## 2016-11-08 DIAGNOSIS — I10 Essential (primary) hypertension: Secondary | ICD-10-CM | POA: Diagnosis not present

## 2016-11-12 ENCOUNTER — Encounter (HOSPITAL_BASED_OUTPATIENT_CLINIC_OR_DEPARTMENT_OTHER): Payer: Medicare Other | Attending: Internal Medicine

## 2016-11-12 DIAGNOSIS — I87312 Chronic venous hypertension (idiopathic) with ulcer of left lower extremity: Secondary | ICD-10-CM | POA: Insufficient documentation

## 2016-11-12 DIAGNOSIS — L928 Other granulomatous disorders of the skin and subcutaneous tissue: Secondary | ICD-10-CM | POA: Diagnosis not present

## 2016-11-12 DIAGNOSIS — I482 Chronic atrial fibrillation: Secondary | ICD-10-CM | POA: Insufficient documentation

## 2016-11-12 DIAGNOSIS — L97825 Non-pressure chronic ulcer of other part of left lower leg with muscle involvement without evidence of necrosis: Secondary | ICD-10-CM | POA: Diagnosis not present

## 2016-11-12 DIAGNOSIS — I739 Peripheral vascular disease, unspecified: Secondary | ICD-10-CM | POA: Diagnosis not present

## 2016-11-12 DIAGNOSIS — I2510 Atherosclerotic heart disease of native coronary artery without angina pectoris: Secondary | ICD-10-CM | POA: Insufficient documentation

## 2016-11-12 DIAGNOSIS — X58XXXA Exposure to other specified factors, initial encounter: Secondary | ICD-10-CM | POA: Diagnosis not present

## 2016-11-12 DIAGNOSIS — I10 Essential (primary) hypertension: Secondary | ICD-10-CM | POA: Diagnosis not present

## 2016-11-12 DIAGNOSIS — S81811A Laceration without foreign body, right lower leg, initial encounter: Secondary | ICD-10-CM | POA: Diagnosis not present

## 2016-11-15 DIAGNOSIS — I87312 Chronic venous hypertension (idiopathic) with ulcer of left lower extremity: Secondary | ICD-10-CM | POA: Diagnosis not present

## 2016-11-15 DIAGNOSIS — I10 Essential (primary) hypertension: Secondary | ICD-10-CM | POA: Diagnosis not present

## 2016-11-15 DIAGNOSIS — S81811A Laceration without foreign body, right lower leg, initial encounter: Secondary | ICD-10-CM | POA: Diagnosis not present

## 2016-11-15 DIAGNOSIS — I482 Chronic atrial fibrillation: Secondary | ICD-10-CM | POA: Diagnosis not present

## 2016-11-15 DIAGNOSIS — L97825 Non-pressure chronic ulcer of other part of left lower leg with muscle involvement without evidence of necrosis: Secondary | ICD-10-CM | POA: Diagnosis not present

## 2016-11-15 DIAGNOSIS — I2510 Atherosclerotic heart disease of native coronary artery without angina pectoris: Secondary | ICD-10-CM | POA: Diagnosis not present

## 2016-11-17 ENCOUNTER — Ambulatory Visit (INDEPENDENT_AMBULATORY_CARE_PROVIDER_SITE_OTHER): Payer: Medicare Other | Admitting: Adult Health

## 2016-11-17 VITALS — BP 126/60 | Temp 98.3°F | Wt 131.0 lb

## 2016-11-17 DIAGNOSIS — S81811A Laceration without foreign body, right lower leg, initial encounter: Secondary | ICD-10-CM | POA: Diagnosis not present

## 2016-11-17 DIAGNOSIS — M25561 Pain in right knee: Secondary | ICD-10-CM
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
## 2016-11-19 ENCOUNTER — Other Ambulatory Visit: Payer: Self-pay | Admitting: Adult Health

## 2016-11-19 DIAGNOSIS — I87312 Chronic venous hypertension (idiopathic) with ulcer of left lower extremity: Secondary | ICD-10-CM | POA: Diagnosis not present

## 2016-11-19 DIAGNOSIS — S81811A Laceration without foreign body, right lower leg, initial encounter: Secondary | ICD-10-CM | POA: Diagnosis not present

## 2016-11-19 DIAGNOSIS — I2510 Atherosclerotic heart disease of native coronary artery without angina pectoris: Secondary | ICD-10-CM | POA: Diagnosis not present

## 2016-11-19 DIAGNOSIS — L97825 Non-pressure chronic ulcer of other part of left lower leg with muscle involvement without evidence of necrosis: Secondary | ICD-10-CM | POA: Diagnosis not present

## 2016-11-19 DIAGNOSIS — I10 Essential (primary) hypertension: Secondary | ICD-10-CM | POA: Diagnosis not present

## 2016-11-19 DIAGNOSIS — I482 Chronic atrial fibrillation: Secondary | ICD-10-CM | POA: Diagnosis not present

## 2016-11-19 DIAGNOSIS — Z760 Encounter for issue of repeat prescription: Secondary | ICD-10-CM

## 2016-11-19 DIAGNOSIS — L97812 Non-pressure chronic ulcer of other part of right lower leg with fat layer exposed: Secondary | ICD-10-CM | POA: Diagnosis not present

## 2016-11-19 NOTE — Telephone Encounter (Signed)
Sent to the pharmacy by e-scribe. 

## 2016-11-22 ENCOUNTER — Ambulatory Visit (INDEPENDENT_AMBULATORY_CARE_PROVIDER_SITE_OTHER): Payer: Medicare Other

## 2016-11-22 ENCOUNTER — Ambulatory Visit (INDEPENDENT_AMBULATORY_CARE_PROVIDER_SITE_OTHER): Payer: Medicare Other | Admitting: Orthopedic Surgery

## 2016-11-22 ENCOUNTER — Ambulatory Visit (INDEPENDENT_AMBULATORY_CARE_PROVIDER_SITE_OTHER): Payer: Medicare Other | Admitting: Family

## 2016-11-22 ENCOUNTER — Encounter (INDEPENDENT_AMBULATORY_CARE_PROVIDER_SITE_OTHER): Payer: Self-pay | Admitting: Family

## 2016-11-22 VITALS — Ht 70.0 in | Wt 131.0 lb

## 2016-11-22 DIAGNOSIS — M25561 Pain in right knee: Secondary | ICD-10-CM

## 2016-11-22 DIAGNOSIS — S81811A Laceration without foreign body, right lower leg, initial encounter: Secondary | ICD-10-CM | POA: Diagnosis not present

## 2016-11-22 DIAGNOSIS — M7631 Iliotibial band syndrome, right leg: Secondary | ICD-10-CM

## 2016-11-22 DIAGNOSIS — I482 Chronic atrial fibrillation: Secondary | ICD-10-CM | POA: Diagnosis not present

## 2016-11-22 DIAGNOSIS — I87312 Chronic venous hypertension (idiopathic) with ulcer of left lower extremity: Secondary | ICD-10-CM | POA: Diagnosis not present

## 2016-11-22 DIAGNOSIS — I2510 Atherosclerotic heart disease of native coronary artery without angina pectoris: Secondary | ICD-10-CM | POA: Diagnosis not present

## 2016-11-22 DIAGNOSIS — L97825 Non-pressure chronic ulcer of other part of left lower leg with muscle involvement without evidence of necrosis: Secondary | ICD-10-CM | POA: Diagnosis not present

## 2016-11-22 DIAGNOSIS — I10 Essential (primary) hypertension: Secondary | ICD-10-CM | POA: Diagnosis not present
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
## 2016-11-26 DIAGNOSIS — I10 Essential (primary) hypertension: Secondary | ICD-10-CM | POA: Diagnosis not present

## 2016-11-26 DIAGNOSIS — L97825 Non-pressure chronic ulcer of other part of left lower leg with muscle involvement without evidence of necrosis: Secondary | ICD-10-CM | POA: Diagnosis not present

## 2016-11-26 DIAGNOSIS — I2510 Atherosclerotic heart disease of native coronary artery without angina pectoris: Secondary | ICD-10-CM | POA: Diagnosis not present

## 2016-11-26 DIAGNOSIS — L97822 Non-pressure chronic ulcer of other part of left lower leg with fat layer exposed: Secondary | ICD-10-CM | POA: Diagnosis not present

## 2016-11-26 DIAGNOSIS — I482 Chronic atrial fibrillation: Secondary | ICD-10-CM | POA: Diagnosis not present

## 2016-11-26 DIAGNOSIS — I87312 Chronic venous hypertension (idiopathic) with ulcer of left lower extremity: Secondary | ICD-10-CM | POA: Diagnosis not present

## 2016-11-26 DIAGNOSIS — S81811A Laceration without foreign body, right lower leg, initial encounter: Secondary | ICD-10-CM | POA: Diagnosis not present

## 2016-11-26 DIAGNOSIS — L97812 Non-pressure chronic ulcer of other part of right lower leg with fat layer exposed: Secondary | ICD-10-CM | POA: Diagnosis not present

## 2016-12-01 ENCOUNTER — Other Ambulatory Visit: Payer: Self-pay | Admitting: Adult Health

## 2016-12-01 ENCOUNTER — Ambulatory Visit (INDEPENDENT_AMBULATORY_CARE_PROVIDER_SITE_OTHER): Payer: Medicare Other | Admitting: Family

## 2016-12-01 ENCOUNTER — Encounter (INDEPENDENT_AMBULATORY_CARE_PROVIDER_SITE_OTHER): Payer: Self-pay | Admitting: Family

## 2016-12-01 VITALS — Ht 70.0 in | Wt 131.0 lb

## 2016-12-01 DIAGNOSIS — M1711 Unilateral primary osteoarthritis, right knee: Secondary | ICD-10-CM | POA: Diagnosis not present

## 2016-12-01 DIAGNOSIS — Z760 Encounter for issue of repeat prescription: Secondary | ICD-10-CM

## 2016-12-01 MED ORDER — MIRABEGRON ER 50 MG PO TB24: 50.0000 mg | ORAL_TABLET | Freq: Every day | ORAL | 3 refills | Status: DC
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
## 2016-12-03 ENCOUNTER — Other Ambulatory Visit: Payer: Self-pay | Admitting: Adult Health

## 2016-12-03 DIAGNOSIS — I482 Chronic atrial fibrillation: Secondary | ICD-10-CM | POA: Diagnosis not present

## 2016-12-03 DIAGNOSIS — I87312 Chronic venous hypertension (idiopathic) with ulcer of left lower extremity: Secondary | ICD-10-CM | POA: Diagnosis not present

## 2016-12-03 DIAGNOSIS — N3289 Other specified disorders of bladder: Secondary | ICD-10-CM | POA: Diagnosis not present

## 2016-12-03 DIAGNOSIS — M25561 Pain in right knee: Secondary | ICD-10-CM

## 2016-12-03 DIAGNOSIS — I10 Essential (primary) hypertension: Secondary | ICD-10-CM | POA: Diagnosis not present

## 2016-12-03 DIAGNOSIS — L97825 Non-pressure chronic ulcer of other part of left lower leg with muscle involvement without evidence of necrosis: Secondary | ICD-10-CM | POA: Diagnosis not present

## 2016-12-03 DIAGNOSIS — Z8551 Personal history of malignant neoplasm of bladder: Secondary | ICD-10-CM | POA: Diagnosis not present

## 2016-12-03 DIAGNOSIS — C649 Malignant neoplasm of unspecified kidney, except renal pelvis: Secondary | ICD-10-CM | POA: Diagnosis not present

## 2016-12-03 DIAGNOSIS — I2510 Atherosclerotic heart disease of native coronary artery without angina pectoris: Secondary | ICD-10-CM | POA: Diagnosis not present

## 2016-12-03 DIAGNOSIS — S81811A Laceration without foreign body, right lower leg, initial encounter: Secondary | ICD-10-CM | POA: Diagnosis not present

## 2016-12-06 ENCOUNTER — Ambulatory Visit (INDEPENDENT_AMBULATORY_CARE_PROVIDER_SITE_OTHER): Payer: Medicare Other | Admitting: Orthopedic Surgery

## 2016-12-07 ENCOUNTER — Ambulatory Visit (INDEPENDENT_AMBULATORY_CARE_PROVIDER_SITE_OTHER): Payer: Medicare Other | Admitting: Adult Health

## 2016-12-07 ENCOUNTER — Encounter: Payer: Self-pay | Admitting: Adult Health

## 2016-12-07 VITALS — BP 134/76 | Temp 98.2°F | Ht 70.0 in | Wt 129.0 lb

## 2016-12-07 DIAGNOSIS — M1990 Unspecified osteoarthritis, unspecified site: Secondary | ICD-10-CM

## 2016-12-07 NOTE — Progress Notes (Signed)
Subjective:    Patient ID: Jeremy Kelley, male    DOB: 20-Nov-1926, 83 y.o.   MRN: [id number]  HPI   83 year old male who  has a past medical history of Arthritis; Carotid artery stenosis (11/2008); Colon polyps; Diverticulosis; Hepatitis; Hyperlipidemia; Hypertension; Numbness; Phimosis; Prostate cancer ([place]) (2001); and Urothelial carcinoma of left distal ureter ([place]).  He presents to the office today to discuss options for arthritic pain in his right knee. He reports that he has stiffness in his right knee when he wakes up in the morning, as the day progresses his stiffness resolves. He denies any swelling, redness, warmth, or pain.   Recent x ray or right knee shows  moderate medial joint space narrowing. No spurring.    Review of Systems See HPI   Past Medical History:  Diagnosis Date  . Arthritis   . Carotid artery stenosis 11/2008   bilateral 40-59% stenosis  . Colon polyps   . Diverticulosis   . Hepatitis    doesn't know which type 1969  . Hyperlipidemia   . Hypertension   . Numbness    fingertips  . Phimosis   . Prostate cancer ([place]) 2001  . Urothelial carcinoma of left distal ureter [facility])     Social History   Social History  . Marital status: Single    Spouse name: N/A  . Number of children: 0  . Years of education: N/A   Occupational History  . Retired    Social History Main Topics  . Smoking status: Former Research scientist (life sciences)  . Smokeless tobacco: Never Used     Comment: 07/29/2015 "might smoke a cigar once/year"  . Alcohol use 8.4 oz/week    14 Glasses of wine per week     Comment: 07/29/2015 "big glass of wine q night"  . Drug use: No  . Sexual activity: Not on file   Other Topics Concern  . Not on file   Social History Narrative   Patient is single, has never been married.  Does not have any children.  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   He drinks approximately 3-4 glasses of wine per week.  No history of excessive alcohol use.   Occasionally smokes a cigar.   Spends winters in [place]         Past Surgical History:  Procedure Laterality Date  . BIOPSY  10/01/2011   Procedure: BIOPSY;  Surgeon: [name], MD;  [facility];  Service: Urology;;  biopsy of bladder tumor  . BLADDER SUSPENSION  08/18/10   [name]  . CARDIAC CATHETERIZATION N/A 07/29/2015   Procedure: Right/Left Heart Cath and Coronary Angiography;  Surgeon: Peter M Martinique, MD;  Location: [place] [facility];  Service: Cardiovascular;  Laterality: N/A;  . CATARACT EXTRACTION W/ INTRAOCULAR LENS  IMPLANT, BILATERAL Bilateral   . CYSTOSCOPY W/ RETROGRADES  01/27/2012   Procedure: CYSTOSCOPY WITH RETROGRADE PYELOGRAM;  Surgeon: [name], MD;  [facility];  Service: Urology;  Laterality: Left;  . CYSTOSCOPY WITH RETROGRADE PYELOGRAM, URETEROSCOPY AND STENT PLACEMENT Left 11/24/2012   Procedure: CYSTOSCOPY WITH left RETROGRADE PYELOGRAM, bladder washings, ureteroscopy;  Surgeon: [name], MD;  [facility];  Service: Urology;  Laterality: Left;  . CYSTOSCOPY WITH RETROGRADE PYELOGRAM, URETEROSCOPY AND STENT PLACEMENT Bilateral 11/19/2013   Procedure: CYSTOSCOPY WITH BILATERAL RETROGRADE PYELOGRAM,;  Surgeon: [name], MD;  [facility];  Service: Urology;  Laterality: Bilateral;  . EYE SURGERY  09/24/10   left  eye. "Retina surgery"  . INGUINAL HERNIA REPAIR  1960  . PROSTATE SURGERY     s/p laparoscopic prostate surgery  . SKIN CANCER EXCISION     a/p skin cancer right foot-non melanoma  . TEE WITHOUT CARDIOVERSION N/A 07/29/2015   Procedure: TRANSESOPHAGEAL ECHOCARDIOGRAM (TEE);  Surgeon: Pixie Casino, MD;  Location: [facility] ENDOSCOPY;  Service: Cardiovascular;  Laterality: N/A;  cath to follow  . TRANSURETHRAL RESECTION OF BLADDER TUMOR  10/01/2011   Procedure: TRANSURETHRAL RESECTION OF BLADDER TUMOR (TURBT);  Surgeon: [name], MD;  [facility];  Service: Urology;  Laterality: N/A;  . TRANSURETHRAL RESECTION OF BLADDER  TUMOR N/A 11/19/2013   Procedure: TRANSURETHRAL RESECTION OF BLADDER TUMOR (TURBT);  Surgeon: [name], MD;  [facility];  Service: Urology;  Laterality: N/A;  . URETEROSCOPY  01/27/2012   Procedure: URETEROSCOPY;  Surgeon: [name], MD;  [facility];  Service: Urology;  Laterality: Left;  CYSTO, Left RETROGRADE PYELOGRPHY, LEFT URETEROSCOPY   . URINARY SPHINCTER IMPLANT    . VARICOSE VEIN SURGERY      Family History  Problem Relation Age of Onset  . Colon cancer Neg Hx   . Colon polyps Neg Hx     No Known Allergies  Current Outpatient Prescriptions on File Prior to Visit  Medication Sig Dispense Refill  . amLODipine (NORVASC) 2.5 MG tablet Take 1 tablet (2.5 mg total) by mouth daily. 90 tablet 3  . apixaban (ELIQUIS) 2.5 MG TABS tablet Take 1 tablet (2.5 mg total) by mouth 2 (two) times daily. 180 tablet 3  . B Complex-C (B-COMPLEX WITH VITAMIN C) tablet Take 1 tablet by mouth daily. Reported on 07/24/2015 90 tablet 3  . cholecalciferol (VITAMIN D) 1000 units tablet Take 1 tablet (1,000 Units total) by mouth daily. 90 tablet 3  . diclofenac (VOLTAREN) 50 MG EC tablet Take 50 mg by mouth 2 (two) times daily.    . methylcellulose (ARTIFICIAL TEARS) 1 % ophthalmic solution Place 1 drop into both eyes 2 (two) times daily.     . metoprolol succinate (TOPROL-XL) 25 MG 24 hr tablet Take 1 tablet (25 mg total) by mouth at bedtime. 90 tablet 3  . mirabegron ER (MYRBETRIQ) 50 MG TB24 tablet Take 1 tablet (50 mg total) by mouth daily. 90 tablet 3  . Multiple Vitamins-Minerals (PRESERVISION/LUTEIN PO) Take 1 tablet by mouth daily.    . pravastatin (PRAVACHOL) 20 MG tablet Take 1 tablet (20 mg total) by mouth at bedtime. 90 tablet 3  . furosemide (LASIX) 40 MG tablet Take 1 tablet (40 mg total) by mouth daily. 90 tablet 3  . KLOR-CON M10 10 MEQ tablet Take 1 tablet (10 mEq total) by mouth once. 90 tablet 3   No current facility-administered medications on file prior to visit.      BP 134/76 (BP Location: Left Arm)   Temp 98.2 F (36.8 C) (Oral)   Ht 5\' 10"  (1.778 m)   Wt 129 lb (58.5 kg)   BMI 18.51 kg/m       Objective:   Physical Exam  Constitutional: He is oriented to person, place, and time. He appears well-developed and well-nourished. No distress.  Cardiovascular: Normal rate, regular rhythm, normal heart sounds and intact distal pulses.  Exam reveals no gallop and no friction rub.   No murmur heard. Pulmonary/Chest: Effort normal and breath sounds normal. No respiratory distress. He has no wheezes. He has no rales. He exhibits no tenderness.  Musculoskeletal: Normal range of motion.  He exhibits no edema, tenderness or deformity.  Neurological: He is alert and oriented to person, place, and time.  Skin: Skin is warm and dry. No rash noted. He is not diaphoretic. No erythema. No pallor.  Psychiatric: He has a normal mood and affect. His behavior is normal. Judgment and thought content normal.  Nursing note and vitals reviewed.     Assessment & Plan:  1. Arthritis - we discussed options. He is taking Eliquis so I advised no motrin or Nsaid. He can use Tylenol arthritis or we can do a steroid injection. He is leaving for [place] for one month this week. He would like to hold off on injection at this point. He will try using Tylenol.  - Follow up if no improvement   [name], NP

## 2016-12-08 DIAGNOSIS — Z8554 Personal history of malignant neoplasm of ureter: Secondary | ICD-10-CM | POA: Diagnosis not present

## 2016-12-08 DIAGNOSIS — S81811A Laceration without foreign body, right lower leg, initial encounter: Secondary | ICD-10-CM | POA: Diagnosis not present

## 2016-12-08 DIAGNOSIS — I482 Chronic atrial fibrillation: Secondary | ICD-10-CM | POA: Diagnosis not present

## 2016-12-08 DIAGNOSIS — L97822 Non-pressure chronic ulcer of other part of left lower leg with fat layer exposed: Secondary | ICD-10-CM | POA: Diagnosis not present

## 2016-12-08 DIAGNOSIS — L97812 Non-pressure chronic ulcer of other part of right lower leg with fat layer exposed: Secondary | ICD-10-CM | POA: Diagnosis not present

## 2016-12-08 DIAGNOSIS — I87312 Chronic venous hypertension (idiopathic) with ulcer of left lower extremity: Secondary | ICD-10-CM | POA: Diagnosis not present

## 2016-12-08 DIAGNOSIS — I2510 Atherosclerotic heart disease of native coronary artery without angina pectoris: Secondary | ICD-10-CM | POA: Diagnosis not present

## 2016-12-08 DIAGNOSIS — L97825 Non-pressure chronic ulcer of other part of left lower leg with muscle involvement without evidence of necrosis: Secondary | ICD-10-CM | POA: Diagnosis not present

## 2016-12-08 DIAGNOSIS — I10 Essential (primary) hypertension: Secondary | ICD-10-CM | POA: Diagnosis not present

## 2016-12-08 DIAGNOSIS — Z8551 Personal history of malignant neoplasm of bladder: Secondary | ICD-10-CM | POA: Diagnosis not present

## 2016-12-14 ENCOUNTER — Encounter: Payer: Self-pay | Admitting: Family Medicine

## 2017-01-10 ENCOUNTER — Ambulatory Visit (INDEPENDENT_AMBULATORY_CARE_PROVIDER_SITE_OTHER): Payer: Medicare Other | Admitting: Family Medicine

## 2017-01-10 ENCOUNTER — Encounter: Payer: Self-pay | Admitting: Family Medicine

## 2017-01-10 VITALS — BP 122/60 | HR 72 | Temp 98.3°F | Wt 127.6 lb

## 2017-01-10 DIAGNOSIS — J00 Acute nasopharyngitis [common cold]: Secondary | ICD-10-CM | POA: Diagnosis not present

## 2017-01-10 DIAGNOSIS — R05 Cough: Secondary | ICD-10-CM | POA: Diagnosis not present

## 2017-01-10 DIAGNOSIS — R059 Cough, unspecified: Secondary | ICD-10-CM

## 2017-01-10 MED ORDER — DEXTROMETHORPHAN-GUAIFENESIN 10-100 MG/5ML PO LIQD: 5.0000 mL | ORAL | 0 refills | Status: DC | PRN

## 2017-01-10 NOTE — Patient Instructions (Addendum)
It is ok to use warm salt water to gargle with to help your sore throat.  You can also gargle with Chloraseptic spray.  It can be found over the counter at your pharmacy.  Remember to drink plenty of fluids even if you don't feel like eating.  It is important to stay hydrated.  If you are not feeling better or are feeling worse (fever, chills, etc) please let the clinic know so that you can be re-evaluated. Upper Respiratory Infection, Adult Most upper respiratory infections (URIs) are caused by a virus. A URI affects the nose, throat, and upper air passages. The most common type of URI is often called "the common cold." Follow these instructions at home:  Take medicines only as told by your doctor.  Gargle warm saltwater or take cough drops to comfort your throat as told by your doctor.  Use a warm mist humidifier or inhale steam from a shower to increase air moisture. This may make it easier to breathe.  Drink enough fluid to keep your pee (urine) clear or pale yellow.  Eat soups and other clear broths.  Have a healthy diet.  Rest as needed.  Go back to work when your fever is gone or your doctor says it is okay. ? You may need to stay home longer to avoid giving your URI to others. ? You can also wear a face mask and wash your hands often to prevent spread of the virus.  Use your inhaler more if you have asthma.  Do not use any tobacco products, including cigarettes, chewing tobacco, or electronic cigarettes. If you need help quitting, ask your doctor. Contact a doctor if:  You are getting worse, not better.  Your symptoms are not helped by medicine.  You have chills.  You are getting more short of breath.  You have brown or red mucus.  You have yellow or brown discharge from your nose.  You have pain in your face, especially when you bend forward.  You have a fever.  You have puffy (swollen) neck glands.  You have pain while swallowing.  You have white areas in  the back of your throat. Get help right away if:  You have very bad or constant: ? Headache. ? Ear pain. ? Pain in your forehead, behind your eyes, and over your cheekbones (sinus pain). ? Chest pain.  You have long-lasting (chronic) lung disease and any of the following: ? Wheezing. ? Long-lasting cough. ? Coughing up blood. ? A change in your usual mucus.  You have a stiff neck.  You have changes in your: ? Vision. ? Hearing. ? Thinking. ? Mood. This information is not intended to replace advice given to you by your health care provider. Make sure you discuss any questions you have with your health care provider. Document Released: 09/15/2007 Document Revised: 11/30/2015 Document Reviewed: 07/04/2013 Elsevier Interactive Patient Education  2018 [employer].

## 2017-01-10 NOTE — Progress Notes (Signed)
Subjective:    Patient ID: Jeremy Kelley, male    DOB: 12-Jun-1926, 83 y.o.   MRN: [id number]  No chief complaint on file.   HPI Patient was seen today for acute issue.  Pt Returned from [place] last night. Endorses 2-3 days sore throat, productive cough, rhinorrhea. Denies HA, fever, chills, N/V, diarrhea, constipation. Patient endorses decreased appetite. Was seen by physician in [place], was given Rx for Andris Flurry, which patient completed.  States similar to a cough/cold medicine.  Patient admits drinking less given his history of prostatectomy in an attempt to decrease incontinence.  Past Medical History:  Diagnosis Date  . Arthritis   . Carotid artery stenosis 11/2008   bilateral 40-59% stenosis  . Colon polyps   . Diverticulosis   . Hepatitis    doesn't know which type 1969  . Hyperlipidemia   . Hypertension   . Numbness    fingertips  . Phimosis   . Prostate cancer ([place]) 2001  . Urothelial carcinoma of left distal ureter [facility])     Past Surgical History:  Procedure Laterality Date  . BIOPSY  10/01/2011   Procedure: BIOPSY;  Surgeon: [name], MD;  [facility];  Service: Urology;;  biopsy of bladder tumor  . BLADDER SUSPENSION  08/18/10   [name]  . CARDIAC CATHETERIZATION N/A 07/29/2015   Procedure: Right/Left Heart Cath and Coronary Angiography;  Surgeon: Peter M Martinique, MD;  Location: [place] [facility];  Service: Cardiovascular;  Laterality: N/A;  . CATARACT EXTRACTION W/ INTRAOCULAR LENS  IMPLANT, BILATERAL Bilateral   . CYSTOSCOPY W/ RETROGRADES  01/27/2012   Procedure: CYSTOSCOPY WITH RETROGRADE PYELOGRAM;  Surgeon: [name], MD;  [facility];  Service: Urology;  Laterality: Left;  . CYSTOSCOPY WITH RETROGRADE PYELOGRAM, URETEROSCOPY AND STENT PLACEMENT Left 11/24/2012   Procedure: CYSTOSCOPY WITH left RETROGRADE PYELOGRAM, bladder washings, ureteroscopy;  Surgeon: [name], MD;  [facility];  Service: Urology;  Laterality: Left;  .  CYSTOSCOPY WITH RETROGRADE PYELOGRAM, URETEROSCOPY AND STENT PLACEMENT Bilateral 11/19/2013   Procedure: CYSTOSCOPY WITH BILATERAL RETROGRADE PYELOGRAM,;  Surgeon: [name], MD;  [facility];  Service: Urology;  Laterality: Bilateral;  . EYE SURGERY  09/24/10   left eye. "Retina surgery"  . INGUINAL HERNIA REPAIR  1960  . PROSTATE SURGERY     s/p laparoscopic prostate surgery  . SKIN CANCER EXCISION     a/p skin cancer right foot-non melanoma  . TEE WITHOUT CARDIOVERSION N/A 07/29/2015   Procedure: TRANSESOPHAGEAL ECHOCARDIOGRAM (TEE);  Surgeon: Pixie Casino, MD;  Location[facility] ENDOSCOPY;  Service: Cardiovascular;  Laterality: N/A;  cath to follow  . TRANSURETHRAL RESECTION OF BLADDER TUMOR  10/01/2011   Procedure: TRANSURETHRAL RESECTION OF BLADDER TUMOR (TURBT);  Surgeon: [name], MD;  [facility];  Service: Urology;  Laterality: N/A;  . TRANSURETHRAL RESECTION OF BLADDER TUMOR N/A 11/19/2013   Procedure: TRANSURETHRAL RESECTION OF BLADDER TUMOR (TURBT);  Surgeon: [name], MD;  [facility];  Service: Urology;  Laterality: N/A;  . URETEROSCOPY  01/27/2012   Procedure: URETEROSCOPY;  Surgeon: [name], MD;  [facility];  Service: Urology;  Laterality: Left;  CYSTO, Left RETROGRADE PYELOGRPHY, LEFT URETEROSCOPY   . URINARY SPHINCTER IMPLANT    . VARICOSE VEIN SURGERY     No Known Allergies  ROS General: Denies fever, chills, night sweats, changes in weight, changes in appetite  +fatigue HEENT: Denies headaches, ear pain, changes in vision  +sore throat, rhinorrhea, productive cough. CV: Denies  CP, palpitations, SOB, orthopnea Pulm: Denies SOB, wheezing  +productive cough GI: Denies abdominal pain, nausea, vomiting, diarrhea, constipation GU: Denies dysuria, hematuria, frequency, vaginal discharge Msk: Denies muscle cramps, joint pains Neuro: Denies weakness, numbness, tingling Skin: Denies rashes, bruising Psych: Denies depression, anxiety,  hallucinations     Objective:    Blood pressure 122/60, pulse 72, temperature 98.3 F (36.8 C), temperature source Oral, weight 127 lb 9.6 oz (57.9 kg).   Gen. Pleasant, well-nourished, in no distress, normal affect   HEENT: [place]/AT, face symmetric, no scleral icterus, PERRL, nares patent with minimal clear drainage, pharynx with mild erythema, no exudate. Lungs: intermittent cough, no accessory muscle use, CTAB, no wheezes or rales Cardiovascular: murmur heard 2/6,  no peripheral edema Abdomen: soft and non-tender, no hepatosplenomegaly, BS normal. Neuro:  A&Ox3, CN II-XII intact, normal gait    Assessment/Plan:  Acute nasopharyngitis -Supportive care -Ok to gargle throat with warm salt water or Chloraseptic spray -Tylenol prn for any pain -Encouraged to increase po intake of water -Given RTC precautions.  Cough  -Encouraged to increase po intake of water. - Plan: dextromethorphan-guaiFENesin (TUSSIN DM) 10-100 MG/5ML liquid

## 2017-01-11 ENCOUNTER — Ambulatory Visit (INDEPENDENT_AMBULATORY_CARE_PROVIDER_SITE_OTHER): Payer: Medicare Other | Admitting: Adult Health

## 2017-01-11 ENCOUNTER — Encounter (HOSPITAL_BASED_OUTPATIENT_CLINIC_OR_DEPARTMENT_OTHER): Payer: Medicare Other | Attending: Internal Medicine

## 2017-01-11 ENCOUNTER — Encounter: Payer: Self-pay | Admitting: Adult Health

## 2017-01-11 VITALS — BP 130/58 | Temp 97.7°F | Ht 70.0 in | Wt 129.0 lb

## 2017-01-11 DIAGNOSIS — I83018 Varicose veins of right lower extremity with ulcer other part of lower leg: Secondary | ICD-10-CM | POA: Diagnosis not present

## 2017-01-11 DIAGNOSIS — I482 Chronic atrial fibrillation: Secondary | ICD-10-CM | POA: Diagnosis not present

## 2017-01-11 DIAGNOSIS — I8393 Asymptomatic varicose veins of bilateral lower extremities: Secondary | ICD-10-CM | POA: Diagnosis not present

## 2017-01-11 DIAGNOSIS — Z760 Encounter for issue of repeat prescription: Secondary | ICD-10-CM

## 2017-01-11 DIAGNOSIS — Z85828 Personal history of other malignant neoplasm of skin: Secondary | ICD-10-CM | POA: Insufficient documentation

## 2017-01-11 DIAGNOSIS — R269 Unspecified abnormalities of gait and mobility: Secondary | ICD-10-CM | POA: Diagnosis not present

## 2017-01-11 DIAGNOSIS — I83028 Varicose veins of left lower extremity with ulcer other part of lower leg: Secondary | ICD-10-CM | POA: Diagnosis not present

## 2017-01-11 MED ORDER — MIRABEGRON ER 50 MG PO TB24: 50.0000 mg | ORAL_TABLET | Freq: Every day | ORAL | 3 refills | Status: DC

## 2017-01-11 NOTE — Progress Notes (Signed)
Subjective:    Patient ID: Jeremy Kelley, male    DOB: 1926-05-23, 83 y.o.   MRN: [id number]  HPI  83 year old male who  has a past medical history of Arthritis; Carotid artery stenosis (11/2008); Colon polyps; Diverticulosis; Hepatitis; Hyperlipidemia; Hypertension; Numbness; Phimosis; Prostate cancer ([place]) (2001); and Urothelial carcinoma of left distal ureter ([place]).  He recently returned from [place] and is here for follow up.   1. He needs a refill of Myrbetriq   2. He continues to have gait instability. He has been seen by PT in the past and feel as though this has been the most helpful. He found himself feeling unsteady on his feet while walking around [place]. He denies falling. He would like to go back to PT for gait training   Review of Systems See HPI   Past Medical History:  Diagnosis Date  . Arthritis   . Carotid artery stenosis 11/2008   bilateral 40-59% stenosis  . Colon polyps   . Diverticulosis   . Hepatitis    doesn't know which type 1969  . Hyperlipidemia   . Hypertension   . Numbness    fingertips  . Phimosis   . Prostate cancer ([place]) 2001  . Urothelial carcinoma of left distal ureter [facility])     Social History   Social History  . Marital status: Single    Spouse name: N/A  . Number of children: 0  . Years of education: N/A   Occupational History  . Retired    Social History Main Topics  . Smoking status: Former Research scientist (life sciences)  . Smokeless tobacco: Never Used     Comment: 07/29/2015 "might smoke a cigar once/year"  . Alcohol use 8.4 oz/week    14 Glasses of wine per week     Comment: 07/29/2015 "big glass of wine q night"  . Drug use: No  . Sexual activity: Not on file   Other Topics Concern  . Not on file   Social History Narrative   Patient is single, has never been married.  Does not have any children.  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   He drinks approximately 3-4 glasses of wine per week.  No history of  excessive alcohol use.  Occasionally smokes a cigar.   Spends winters in [place]         Past Surgical History:  Procedure Laterality Date  . BIOPSY  10/01/2011   Procedure: BIOPSY;  Surgeon: [name], MD;  [facility];  Service: Urology;;  biopsy of bladder tumor  . BLADDER SUSPENSION  08/18/10   [name]  . CARDIAC CATHETERIZATION N/A 07/29/2015   Procedure: Right/Left Heart Cath and Coronary Angiography;  Surgeon: Peter M Martinique, MD;  Location: [place] [facility];  Service: Cardiovascular;  Laterality: N/A;  . CATARACT EXTRACTION W/ INTRAOCULAR LENS  IMPLANT, BILATERAL Bilateral   . CYSTOSCOPY W/ RETROGRADES  01/27/2012   Procedure: CYSTOSCOPY WITH RETROGRADE PYELOGRAM;  Surgeon: [name], MD;  [facility];  Service: Urology;  Laterality: Left;  . CYSTOSCOPY WITH RETROGRADE PYELOGRAM, URETEROSCOPY AND STENT PLACEMENT Left 11/24/2012   Procedure: CYSTOSCOPY WITH left RETROGRADE PYELOGRAM, bladder washings, ureteroscopy;  Surgeon: [name], MD;  [facility];  Service: Urology;  Laterality: Left;  . CYSTOSCOPY WITH RETROGRADE PYELOGRAM, URETEROSCOPY AND STENT PLACEMENT Bilateral 11/19/2013   Procedure: CYSTOSCOPY WITH BILATERAL RETROGRADE PYELOGRAM,;  Surgeon: [name], MD;  [facility];  Service: Urology;  Laterality: Bilateral;  .  EYE SURGERY  09/24/10   left eye. "Retina surgery"  . INGUINAL HERNIA REPAIR  1960  . PROSTATE SURGERY     s/p laparoscopic prostate surgery  . SKIN CANCER EXCISION     a/p skin cancer right foot-non melanoma  . TEE WITHOUT CARDIOVERSION N/A 07/29/2015   Procedure: TRANSESOPHAGEAL ECHOCARDIOGRAM (TEE);  Surgeon: Pixie Casino, MD;  Location: [facility] ENDOSCOPY;  Service: Cardiovascular;  Laterality: N/A;  cath to follow  . TRANSURETHRAL RESECTION OF BLADDER TUMOR  10/01/2011   Procedure: TRANSURETHRAL RESECTION OF BLADDER TUMOR (TURBT);  Surgeon: [name], MD;  [facility];  Service: Urology;  Laterality: N/A;  . TRANSURETHRAL  RESECTION OF BLADDER TUMOR N/A 11/19/2013   Procedure: TRANSURETHRAL RESECTION OF BLADDER TUMOR (TURBT);  Surgeon: [name], MD;  [facility];  Service: Urology;  Laterality: N/A;  . URETEROSCOPY  01/27/2012   Procedure: URETEROSCOPY;  Surgeon: [name], MD;  [facility];  Service: Urology;  Laterality: Left;  CYSTO, Left RETROGRADE PYELOGRPHY, LEFT URETEROSCOPY   . URINARY SPHINCTER IMPLANT    . VARICOSE VEIN SURGERY      Family History  Problem Relation Age of Onset  . Colon cancer Neg Hx   . Colon polyps Neg Hx     No Known Allergies  Current Outpatient Prescriptions on File Prior to Visit  Medication Sig Dispense Refill  . amLODipine (NORVASC) 2.5 MG tablet Take 1 tablet (2.5 mg total) by mouth daily. 90 tablet 3  . apixaban (ELIQUIS) 2.5 MG TABS tablet Take 1 tablet (2.5 mg total) by mouth 2 (two) times daily. 180 tablet 3  . B Complex-C (B-COMPLEX WITH VITAMIN C) tablet Take 1 tablet by mouth daily. Reported on 07/24/2015 90 tablet 3  . cholecalciferol (VITAMIN D) 1000 units tablet Take 1 tablet (1,000 Units total) by mouth daily. 90 tablet 3  . dextromethorphan-guaiFENesin (TUSSIN DM) 10-100 MG/5ML liquid Take 5 mLs by mouth every 4 (four) hours as needed for cough. 118 mL 0  . diclofenac (VOLTAREN) 50 MG EC tablet Take 50 mg by mouth 2 (two) times daily.    . methylcellulose (ARTIFICIAL TEARS) 1 % ophthalmic solution Place 1 drop into both eyes 2 (two) times daily.     . metoprolol succinate (TOPROL-XL) 25 MG 24 hr tablet Take 1 tablet (25 mg total) by mouth at bedtime. 90 tablet 3  . Multiple Vitamins-Minerals (PRESERVISION/LUTEIN PO) Take 1 tablet by mouth daily.    . pravastatin (PRAVACHOL) 20 MG tablet Take 1 tablet (20 mg total) by mouth at bedtime. 90 tablet 3  . furosemide (LASIX) 40 MG tablet Take 1 tablet (40 mg total) by mouth daily. 90 tablet 3  . KLOR-CON M10 10 MEQ tablet Take 1 tablet (10 mEq total) by mouth once. 90 tablet 3   No current  facility-administered medications on file prior to visit.     BP (!) 130/58 (BP Location: Left Arm)   Temp 97.7 F (36.5 C) (Oral)   Ht 5\' 10"  (1.778 m)   Wt 129 lb (58.5 kg)   BMI 18.51 kg/m        Objective:   Physical Exam  Constitutional: He is oriented to person, place, and time. He appears well-developed and well-nourished. No distress.  Cardiovascular: Normal rate and intact distal pulses.  An irregularly irregular rhythm present. Exam reveals no gallop.   Murmur heard. Pulmonary/Chest: Effort normal and breath sounds normal. No respiratory distress. He has no wheezes. He has no rales. He exhibits no  tenderness.  Musculoskeletal:  Slow shuffling gait    Neurological: He is alert and oriented to person, place, and time.  Skin: Skin is warm and dry. No rash noted. He is not diaphoretic. No erythema. No pallor.  Wounds on bilateral legs are well healed   Psychiatric: He has a normal mood and affect. His behavior is normal. Judgment and thought content normal.  Vitals reviewed.     Assessment & Plan:  1. Gait disturbance - Ambulatory referral to Physical Therapy  2. Medication refill  - mirabegron ER (MYRBETRIQ) 50 MG TB24 tablet; Take 1 tablet (50 mg total) by mouth daily.  Dispense: 90 tablet; Refill: 3   [name], NP

## 2017-01-13 ENCOUNTER — Encounter: Payer: Self-pay | Admitting: Physical Therapy

## 2017-01-13 ENCOUNTER — Ambulatory Visit: Payer: Medicare Other | Attending: Adult Health | Admitting: Physical Therapy

## 2017-01-13 DIAGNOSIS — M6281 Muscle weakness (generalized): Secondary | ICD-10-CM | POA: Diagnosis not present

## 2017-01-13 DIAGNOSIS — R269 Unspecified abnormalities of gait and mobility: Secondary | ICD-10-CM | POA: Diagnosis not present

## 2017-01-13 DIAGNOSIS — R293 Abnormal posture: Secondary | ICD-10-CM

## 2017-01-13 NOTE — Patient Instructions (Signed)
Ride bike or nustep for 15 min  Look into compression hose for veins     [employer] [place] [employer] 20-30 mmHg Firm Graduated Compression Calf Sleeves - Medical Grade Athletic Support Circulation & Recovery - Best for Sports [employer], [facility] [address], [place], [place] [address] Phone # [phone] Fax [phone]
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
## 2017-01-17 ENCOUNTER — Ambulatory Visit: Payer: Medicare Other | Admitting: Physical Therapy

## 2017-01-17 ENCOUNTER — Encounter: Payer: Self-pay | Admitting: Physical Therapy

## 2017-01-17 DIAGNOSIS — R293 Abnormal posture: Secondary | ICD-10-CM

## 2017-01-17 DIAGNOSIS — R269 Unspecified abnormalities of gait and mobility: Secondary | ICD-10-CM | POA: Diagnosis not present

## 2017-01-17 DIAGNOSIS — M6281 Muscle weakness (generalized): Secondary | ICD-10-CM | POA: Diagnosis not present

## 2017-01-17 NOTE — Therapy (Signed)
[facility] [address] [place], [place], [address] Phone: [phone]   Fax:  [phone]  Physical Therapy Treatment  Patient Details  Name: Jeremy Kelley MRN: [id number] Date of Birth: [birth year] Referring Provider: Dr. [name]  Encounter Date: 01/17/2017      PT End of Session - 01/17/17 1151    Visit Number 2   Number of Visits 10   Date for PT Re-Evaluation 02/10/17   Authorization Type medicare   PT Start Time 1151  had to go to the bathroom   PT Stop Time 1230   PT Time Calculation (min) 39 min   Behavior During Therapy [facility] for tasks assessed/performed      Past Medical History:  Diagnosis Date  . Arthritis   . Carotid artery stenosis 11/2008   bilateral 40-59% stenosis  . Colon polyps   . Diverticulosis   . Hepatitis    doesn't know which type 1969  . Hyperlipidemia   . Hypertension   . Numbness    fingertips  . Phimosis   . Prostate cancer ([place]) 2001  . Urothelial carcinoma of left distal ureter [facility])     Past Surgical History:  Procedure Laterality Date  . BIOPSY  10/01/2011   Procedure: BIOPSY;  Surgeon: [name], MD;  [facility]: [facility];  Service: Urology;;  biopsy of bladder tumor  . BLADDER SUSPENSION  08/18/10   [name]  . CARDIAC CATHETERIZATION N/A 07/29/2015   Procedure: Right/Left Heart Cath and Coronary Angiography;  Surgeon: Peter M Martinique, MD;  Location: [place] [facility];  Service: Cardiovascular;  Laterality: N/A;  . CATARACT EXTRACTION W/ INTRAOCULAR LENS  IMPLANT, BILATERAL Bilateral   . CYSTOSCOPY W/ RETROGRADES  01/27/2012   Procedure: CYSTOSCOPY WITH RETROGRADE PYELOGRAM;  Surgeon: [name], MD;  [facility];  Service: Urology;  Laterality: Left;  . CYSTOSCOPY WITH RETROGRADE PYELOGRAM, URETEROSCOPY AND STENT PLACEMENT Left 11/24/2012   Procedure: CYSTOSCOPY WITH left RETROGRADE PYELOGRAM, bladder washings, ureteroscopy;  Surgeon: [name], MD;   [facility];  Service: Urology;  Laterality: Left;  . CYSTOSCOPY WITH RETROGRADE PYELOGRAM, URETEROSCOPY AND STENT PLACEMENT Bilateral 11/19/2013   Procedure: CYSTOSCOPY WITH BILATERAL RETROGRADE PYELOGRAM,;  Surgeon: [name], MD;  [facility];  Service: Urology;  Laterality: Bilateral;  . EYE SURGERY  09/24/10   left eye. "Retina surgery"  . INGUINAL HERNIA REPAIR  1960  . PROSTATE SURGERY     s/p laparoscopic prostate surgery  . SKIN CANCER EXCISION     a/p skin cancer right foot-non melanoma  . TEE WITHOUT CARDIOVERSION N/A 07/29/2015   Procedure: TRANSESOPHAGEAL ECHOCARDIOGRAM (TEE);  Surgeon: Pixie Casino, MD;  Location: [facility] ENDOSCOPY;  Service: Cardiovascular;  Laterality: N/A;  cath to follow  . TRANSURETHRAL RESECTION OF BLADDER TUMOR  10/01/2011   Procedure: TRANSURETHRAL RESECTION OF BLADDER TUMOR (TURBT);  Surgeon: [name], MD;  [facility];  Service: Urology;  Laterality: N/A;  . TRANSURETHRAL RESECTION OF BLADDER TUMOR N/A 11/19/2013   Procedure: TRANSURETHRAL RESECTION OF BLADDER TUMOR (TURBT);  Surgeon: [name], MD;  [facility];  Service: Urology;  Laterality: N/A;  . URETEROSCOPY  01/27/2012   Procedure: URETEROSCOPY;  Surgeon: [name], MD;  [facility];  Service: Urology;  Laterality: Left;  CYSTO, Left RETROGRADE PYELOGRPHY, LEFT URETEROSCOPY   . URINARY SPHINCTER IMPLANT    . VARICOSE VEIN SURGERY      There were no vitals filed for this visit.  Subjective Assessment - 01/17/17 1156    Subjective I started at the gym.  I did the bike and other equipment.     Patient Stated Goals improve strength and endurance.   Currently in Pain? No/denies                         [facility] Adult PT Treatment/Exercise - 01/17/17 0001      Exercises   Exercises Knee/Hip     Knee/Hip Exercises: Aerobic   Stationary Bike level 3 for 2 min; level 2 for 3 min     Knee/Hip Exercises: Machines for Strengthening   Cybex  Knee Extension 5# 15X   Cybex Knee Flexion 15# 15X   Cybex Leg Press 75# both 30x  seat#6   Other Machine lat bar; chest press; tricep; bicep, scapretraction, overhead flexion,                 PT Education - 01/17/17 1324    Education provided Yes   Education Details balance exercises and knee strengthening; tips to avoid falls   Person(s) Educated Patient   Methods Explanation;Demonstration;Handout   Comprehension Verbalized understanding;Returned demonstration             PT Long Term Goals - 01/17/17 1328      PT LONG TERM GOAL #1   Title understand how to exercise at the gym with correct body mechanics and progress himself   Time 4   Period Weeks   Status On-going     PT LONG TERM GOAL #2   Title TUG score is </= 13 seconds due to increased strength   Time 4   Period Weeks   Status On-going     PT LONG TERM GOAL #3   Title Berg balance >/= 52/56 due to ability to stand on leg and tandem stance longer and steadier   Time 4   Period Weeks   Status On-going     PT LONG TERM GOAL #4   Title patient reports his endurance for daily acitivities has improved by 25% due to increased overall strength   Time 4   Period Weeks   Status On-going               Plan - 01/17/17 1152    Clinical Impression Statement Patient understands tips to avoid falls.  Patient has started at the gym with light weights.  Patient continues to need verbal cues to correct his posture.  Patient not able to do the knee extension machine due to lower legs heeling from the accident. Patient will benefit from skilled therapy to improve over all strength and balance.    Rehab Potential Excellent   Clinical Impairments Affecting Rehab Potential history of prostate cancer   PT Frequency 2x / week   PT Duration 4 weeks   PT Treatment/Interventions Therapeutic activities;Therapeutic exercise;Balance training;Neuromuscular re-education;Patient/family education;Manual techniques;Gait  training   PT Next Visit Plan postural education with strengthening; exercises to do at the gym to strengthen arm and legs   PT Home Exercise Plan tips to avoid falls; balance exercises on 1 foot and tandem stance;    Recommended Other Services MD signed initial eval   Consulted and Agree with Plan of Care Patient      Patient will benefit from skilled therapeutic intervention in order to improve the following deficits and impairments:  Decreased strength, Decreased balance, Difficulty walking, Postural dysfunction  Visit Diagnosis: Abnormal posture  Muscle weakness (generalized)  Abnormality of gait     Problem List Patient Active Problem List   Diagnosis Date Noted  . Closed pilon fracture, left, sequela 10/11/2016  . Pain in left ankle and joints of left foot 10/04/2016  . 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 Fatigue 11/04/2015  . CAD (coronary artery disease) 08/01/2015  . Pulmonary hypertension ([place]) 07/24/2015  . Atrial fibrillation ([place]) 07/16/2015  . Aortic insufficiency 01/04/2014  . Squamous cell carcinoma in situ of skin of right lower leg 11/13/2013  . Lentigo maligna of cheek ([place]) 09/19/2013  . Actinic keratosis 11/06/2012  . Macular degeneration 08/10/2012  . Allergic rhinitis 08/10/2012  . Bladder cancer ([place]) 10/19/2011  . Eczema 02/23/2011  . Hyperglycemia 02/11/2011  . Heart murmur 09/10/2010  . ABNORMAL THYROID FUNCTION TESTS 08/05/2009  . ABNORMAL HEART RHYTHMS 07/11/2009  . Hyperlipidemia 11/28/2008  . CAROTID BRUIT 11/19/2008  . UNSPECIFIED CATARACT 10/17/2008  . PARESTHESIA 12/04/2007  . Varicose veins of bilateral lower extremities with other complications 56/43/3295  . COLONIC POLYPS, HX OF 08/28/2007  . Essential hypertension 02/08/2007  . PROSTATE CANCER, HX OF 11/04/2006  . INGUINAL HERNIA, HX OF 11/04/2006    Earlie Counts, PT 01/17/17 1:29 PM   [facility] [address] [place], [place], [address] Phone: [phone]   Fax:  [phone]  Name: Jeremy Kelley MRN: [id number] Date of Birth: 12-21-1926

## 2017-01-17 NOTE — Patient Instructions (Addendum)
Fall Prevention and Home Safety Falls cause injuries and can affect all age groups. It is possible to use preventive measures to significantly decrease the likelihood of falls. There are many simple measures which can make your home safer and prevent falls. OUTDOORS  Repair cracks and edges of walkways and driveways.  Remove high doorway thresholds.  Trim shrubbery on the main path into your home.  Have good outside lighting.  Clear walkways of tools, rocks, debris, and clutter.  Check that handrails are not broken and are securely fastened. Both sides of steps should have handrails.  Have leaves, snow, and ice cleared regularly.  Use sand or salt on walkways during winter months.  In the garage, clean up grease or oil spills. BATHROOM  Install night lights.  Install grab bars by the toilet and in the tub and shower.  Use non-skid mats or decals in the tub or shower.  Place a plastic non-slip stool in the shower to sit on, if needed.  Keep floors dry and clean up all water on the floor immediately.  Remove soap buildup in the tub or shower on a regular basis.  Secure bath mats with non-slip, double-sided rug tape.  Remove throw rugs and tripping hazards from the floors. BEDROOMS  Install night lights.  Make sure a bedside light is easy to reach.  Do not use oversized bedding.  Keep a telephone by your bedside.  Have a firm chair with side arms to use for getting dressed.  Remove throw rugs and tripping hazards from the floor. KITCHEN  Keep handles on pots and pans turned toward the center of the stove. Use back burners when possible.  Clean up spills quickly and allow time for drying.  Avoid walking on wet floors.  Avoid hot utensils and knives.  Position shelves so they are not too high or low.  Place commonly used objects within easy reach.  If necessary, use a sturdy step stool with a grab bar when reaching.  Keep electrical cables out of the  way.  Do not use floor polish or wax that makes floors slippery. If you must use wax, use non-skid floor wax.  Remove throw rugs and tripping hazards from the floor. STAIRWAYS  Never leave objects on stairs.  Place handrails on both sides of stairways and use them. Fix any loose handrails. Make sure handrails on both sides of the stairways are as long as the stairs.  Check carpeting to make sure it is firmly attached along stairs. Make repairs to worn or loose carpet promptly.  Avoid placing throw rugs at the top or bottom of stairways, or properly secure the rug with carpet tape to prevent slippage. Get rid of throw rugs, if possible.  Have an electrician put in a light switch at the top and bottom of the stairs. OTHER FALL PREVENTION TIPS  Wear low-heel or rubber-soled shoes that are supportive and fit well. Wear closed toe shoes.  When using a stepladder, make sure it is fully opened and both spreaders are firmly locked. Do not climb a closed stepladder.  Add color or contrast paint or tape to grab bars and handrails in your home. Place contrasting color strips on first and last steps.  Learn and use mobility aids as needed. Install an electrical emergency response system.  Turn on lights to avoid dark areas. Replace light bulbs that burn out immediately. Get light switches that glow.  Arrange furniture to create clear pathways. Keep furniture in the same place.    Firmly attach carpet with non-skid or double-sided tape.  Eliminate uneven floor surfaces.  Select a carpet pattern that does not visually hide the edge of steps.  Be aware of all pets. OTHER HOME SAFETY TIPS  Set the water temperature for 120 F (48.8 C).  Keep emergency numbers on or near the telephone.  Keep smoke detectors on every level of the home and near sleeping areas. Document Released: 03/19/2002 Document Revised: 09/28/2011 Document Reviewed: 06/18/2011 [facility] Patient Information 2015  [place], [place]. This information is not intended to replace advice given to you by your health care provider. Make sure you discuss any questions you have with your health care provider.  EXTENSION: Sitting - Resistance Band (Active)    Sit with feet flat. Against red resistance band, straighten right knee. Complete _2__ sets of _15__ repetitions. Perform _1__ sessions per day. Place red band under foot then criss cross on top of foot.  Copyright  VHI. All rights reserved.   With Support    Stand on one leg in neutral spine holding support. Hold _15___ seconds. Repeat on other leg. Do __3__ repetitions, __1__ sets.  http://bt.exer.us/34   Copyright  VHI. All rights reserved.  Tandem Stance    Right foot in front of left, heel touching toe both feet "straight ahead". Stand on Foot Triangle of Support with both feet. Balance in this position _30__ seconds. 2 times each way.  Do with left foot in front of right. Do at the counter.  Copyright  VHI. All rights reserved.   Hip Flexion    Standing on one leg, bring other leg up so thigh is parallel to floor. Hold __1__ seconds. Repeat on other leg. Count 1,2,3 up and down. Go dlowly Do __10__ repetitions, __1__ sets.  http://bt.exer.us/40   Copyright  VHI. All rights reserved.  [place] [address], [place], [place] [address] Phone # [phone] Fax [phone]

## 2017-01-18 DIAGNOSIS — I482 Chronic atrial fibrillation: Secondary | ICD-10-CM | POA: Diagnosis not present

## 2017-01-18 DIAGNOSIS — L988 Other specified disorders of the skin and subcutaneous tissue: Secondary | ICD-10-CM | POA: Diagnosis not present

## 2017-01-18 DIAGNOSIS — I8393 Asymptomatic varicose veins of bilateral lower extremities: Secondary | ICD-10-CM | POA: Diagnosis not present

## 2017-01-18 DIAGNOSIS — Z85828 Personal history of other malignant neoplasm of skin: Secondary | ICD-10-CM | POA: Diagnosis not present

## 2017-01-19 ENCOUNTER — Encounter: Payer: Medicare Other | Admitting: Physical Therapy

## 2017-01-20 ENCOUNTER — Ambulatory Visit: Payer: Medicare Other | Admitting: Physical Therapy

## 2017-01-20 ENCOUNTER — Encounter: Payer: Self-pay | Admitting: Physical Therapy

## 2017-01-20 DIAGNOSIS — M6281 Muscle weakness (generalized): Secondary | ICD-10-CM | POA: Diagnosis not present

## 2017-01-20 DIAGNOSIS — R269 Unspecified abnormalities of gait and mobility: Secondary | ICD-10-CM | POA: Diagnosis not present

## 2017-01-20 DIAGNOSIS — R293 Abnormal posture: Secondary | ICD-10-CM

## 2017-01-20 NOTE — Therapy (Signed)
[facility] [address] [place], [place], [address] Phone: [phone]   Fax:  [phone]  Physical Therapy Treatment  Patient Details  Name: Jeremy Kelley MRN: [id number] Date of Birth: December 30, 1926 Referring Provider: Dr. [name]  Encounter Date: 01/20/2017      PT End of Session - 01/20/17 1321    Visit Number 3   Number of Visits 10   Date for PT Re-Evaluation 02/10/17   Authorization Type medicare   PT Start Time 1230   PT Stop Time 1308   PT Time Calculation (min) 38 min   Activity Tolerance Patient tolerated treatment well   Behavior During Therapy [facility] for tasks assessed/performed      Past Medical History:  Diagnosis Date  . Arthritis   . Carotid artery stenosis 11/2008   bilateral 40-59% stenosis  . Colon polyps   . Diverticulosis   . Hepatitis    doesn't know which type 1969  . Hyperlipidemia   . Hypertension   . Numbness    fingertips  . Phimosis   . Prostate cancer ([place]) 2001  . Urothelial carcinoma of left distal ureter [facility])     Past Surgical History:  Procedure Laterality Date  . BIOPSY  10/01/2011   Procedure: BIOPSY;  Surgeon: Dutch , MD;  [facility];  Service: Urology;;  biopsy of bladder tumor  . BLADDER SUSPENSION  08/18/10   [name]  . CARDIAC CATHETERIZATION N/A 07/29/2015   Procedure: Right/Left Heart Cath and Coronary Angiography;  Surgeon: Peter M Martinique, MD;  Location: [place] [facility];  Service: Cardiovascular;  Laterality: N/A;  . CATARACT EXTRACTION W/ INTRAOCULAR LENS  IMPLANT, BILATERAL Bilateral   . CYSTOSCOPY W/ RETROGRADES  01/27/2012   Procedure: CYSTOSCOPY WITH RETROGRADE PYELOGRAM;  Surgeon: Dutch , MD;  [facility];  Service: Urology;  Laterality: Left;  . CYSTOSCOPY WITH RETROGRADE PYELOGRAM, URETEROSCOPY AND STENT PLACEMENT Left 11/24/2012   Procedure: CYSTOSCOPY WITH left RETROGRADE PYELOGRAM, bladder washings, ureteroscopy;   Surgeon: Dutch , MD;  [facility];  Service: Urology;  Laterality: Left;  . CYSTOSCOPY WITH RETROGRADE PYELOGRAM, URETEROSCOPY AND STENT PLACEMENT Bilateral 11/19/2013   Procedure: CYSTOSCOPY WITH BILATERAL RETROGRADE PYELOGRAM,;  Surgeon: [name], MD;  [facility];  Service: Urology;  Laterality: Bilateral;  . EYE SURGERY  09/24/10   left eye. "Retina surgery"  . INGUINAL HERNIA REPAIR  1960  . PROSTATE SURGERY     s/p laparoscopic prostate surgery  . SKIN CANCER EXCISION     a/p skin cancer right foot-non melanoma  . TEE WITHOUT CARDIOVERSION N/A 07/29/2015   Procedure: TRANSESOPHAGEAL ECHOCARDIOGRAM (TEE);  Surgeon: Pixie Casino, MD;  Location: [facility] ENDOSCOPY;  Service: Cardiovascular;  Laterality: N/A;  cath to follow  . TRANSURETHRAL RESECTION OF BLADDER TUMOR  10/01/2011   Procedure: TRANSURETHRAL RESECTION OF BLADDER TUMOR (TURBT);  Surgeon: Dutch , MD;  [facility];  Service: Urology;  Laterality: N/A;  . TRANSURETHRAL RESECTION OF BLADDER TUMOR N/A 11/19/2013   Procedure: TRANSURETHRAL RESECTION OF BLADDER TUMOR (TURBT);  Surgeon: [name], MD;  [facility];  Service: Urology;  Laterality: N/A;  . URETEROSCOPY  01/27/2012   Procedure: URETEROSCOPY;  Surgeon: Dutch , MD;  [facility];  Service: Urology;  Laterality: Left;  CYSTO, Left RETROGRADE PYELOGRPHY, LEFT URETEROSCOPY   . URINARY SPHINCTER IMPLANT    . VARICOSE VEIN SURGERY      There were no vitals filed for this visit.  Subjective Assessment - 01/20/17 1236    Subjective I have to use my stockings.    Patient Stated Goals improve strength and endurance.   Currently in Pain? No/denies                         [facility] Adult PT Treatment/Exercise - 01/20/17 0001      Knee/Hip Exercises: Aerobic   Elliptical 1.5 min level 1     Knee/Hip Exercises: Machines for Strengthening   Cybex Leg Press 75# both 30x; seat #6  instruction on using th full motion      Knee/Hip Exercises: Standing   Heel Raises Both;15 reps  on step                PT Education - 01/20/17 1319    Education provided Yes   Education Details education on using equipment at the gym   Person(s) Educated Patient   Methods Explanation;Demonstration;Verbal cues;Handout   Comprehension Returned demonstration;Verbalized understanding             PT Long Term Goals - 01/17/17 1328      PT LONG TERM GOAL #1   Title understand how to exercise at the gym with correct body mechanics and progress himself   Time 4   Period Weeks   Status On-going     PT LONG TERM GOAL #2   Title TUG score is </= 13 seconds due to increased strength   Time 4   Period Weeks   Status On-going     PT LONG TERM GOAL #3   Title Berg balance >/= 52/56 due to ability to stand on leg and tandem stance longer and steadier   Time 4   Period Weeks   Status On-going     PT LONG TERM GOAL #4   Title patient reports his endurance for daily acitivities has improved by 25% due to increased overall strength   Time 4   Period Weeks   Status On-going               Plan - 01/20/17 1241    Clinical Impression Statement Patient is learning HEP.  Patient is able to use the equipment correctly.  Patient will benefit from skilled therapy to improve over all strength and balance.    Rehab Potential Excellent   Clinical Impairments Affecting Rehab Potential history of prostate cancer   PT Frequency 2x / week   PT Duration 4 weeks   PT Treatment/Interventions Therapeutic activities;Therapeutic exercise;Balance training;Neuromuscular re-education;Patient/family education;Manual techniques;Gait training   PT Next Visit Plan postural education with strengthening; exercises to do at the gym to strengthen arm and legs   PT Home Exercise Plan progress as needed   Consulted and Agree with Plan of Care Patient      Patient will benefit from skilled therapeutic intervention in order to  improve the following deficits and impairments:  Decreased strength, Decreased balance, Difficulty walking, Postural dysfunction  Visit Diagnosis: Abnormal posture  Muscle weakness (generalized)     Problem List Patient Active Problem List   Diagnosis Date Noted  . Closed pilon fracture, left, sequela 10/11/2016  . Pain in left ankle and joints of left foot 10/04/2016  . 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 Fatigue 11/04/2015  . CAD (coronary artery disease) 08/01/2015  . Pulmonary hypertension ([place]) 07/24/2015  . Atrial fibrillation ([place]) 07/16/2015  . Aortic insufficiency 01/04/2014  .  Squamous cell carcinoma in situ of skin of right lower leg 11/13/2013  . Lentigo maligna of cheek ([place]) 09/19/2013  . Actinic keratosis 11/06/2012  . Macular degeneration 08/10/2012  . Allergic rhinitis 08/10/2012  . Bladder cancer ([place]) 10/19/2011  . Eczema 02/23/2011  . Hyperglycemia 02/11/2011  . Heart murmur 09/10/2010  . ABNORMAL THYROID FUNCTION TESTS 08/05/2009  . ABNORMAL HEART RHYTHMS 07/11/2009  . Hyperlipidemia 11/28/2008  . CAROTID BRUIT 11/19/2008  . UNSPECIFIED CATARACT 10/17/2008  . PARESTHESIA 12/04/2007  . Varicose veins of bilateral lower extremities with other complications 54/49/2010  . COLONIC POLYPS, HX OF 08/28/2007  . Essential hypertension 02/08/2007  . PROSTATE CANCER, HX OF 11/04/2006  . INGUINAL HERNIA, HX OF 11/04/2006    Earlie Counts, PT 01/20/17 1:24 PM   [facility] [address], [place], [place], [address] Phone: [phone]   Fax:  [phone]  Name: Jeremy Kelley MRN: [id number] Date of Birth: 10/01/1926

## 2017-01-20 NOTE — Patient Instructions (Signed)
   Heel Raise: Standing [facility])    Toes on board, heels on floor, knees slightly bent, rise up on toes as high as possible. Do _2___ sets. Complete _10___ repetitions.  http://st.exer.us/144    Go on the elliptical for 1-2 minutes  Level 1  Leg Abduction: Sitting (Machine)    Move legs outward and slowly return to start. Do __1__ sets. Complete __15__ repetitions.  http://st.exer.us/354   Copyright  VHI. All rights reserved.   Leg Curl: Sitting (Machine)    Bring heels as close to buttocks as possible keeping feet flexed toward knees. Do __1__ sets. Complete _15___ repetitions. Work through the motions http://st.exer.us/376   Copyright  VHI. All rights reserved.    [place] [address], [place], [place] [address] Phone # [phone] Fax [phone]

## 2017-01-24 ENCOUNTER — Ambulatory Visit: Payer: Medicare Other | Admitting: Physical Therapy

## 2017-01-24 ENCOUNTER — Encounter: Payer: Self-pay | Admitting: Physical Therapy

## 2017-01-24 DIAGNOSIS — M6281 Muscle weakness (generalized): Secondary | ICD-10-CM | POA: Diagnosis not present

## 2017-01-24 DIAGNOSIS — R293 Abnormal posture: Secondary | ICD-10-CM | POA: Diagnosis not present

## 2017-01-24 DIAGNOSIS — R269 Unspecified abnormalities of gait and mobility: Secondary | ICD-10-CM

## 2017-01-24 NOTE — Patient Instructions (Addendum)
Tandem Stance    Stand by the counter. Right foot in front of left, heel touching toe both feet "straight ahead". Stand on Foot Triangle of Support with both feet. Balance in this position _30__ seconds. Do with left foot in front of right .2 times each way.  Copyright  VHI. All rights reserved.   With Support    Stand on one leg in neutral spine holding support. Hold _10___ seconds. Repeat on other leg. Do __3__ repetitions, _1___ sets.  http://bt.exer.us/34   Copyright  VHI. All rights reserved.    Look up compression stockings with a zipper.     [place] [address], [place], [place] [address] Phone # [phone] Fax [phone]

## 2017-01-24 NOTE — Therapy (Signed)
[facility] [address] [place], [place], [address] Phone: [phone]   Fax:  [phone]  Physical Therapy Treatment  Patient Details  Name: Jeremy Kelley MRN: [id number] Date of Birth: December 10, 1926 Referring Provider: Dr. [name]  Encounter Date: 01/24/2017      PT End of Session - 01/24/17 1224    Visit Number 4   Number of Visits 10   Date for PT Re-Evaluation 02/10/17   Authorization Type medicare   PT Start Time 5465   PT Stop Time 1224   PT Time Calculation (min) 39 min   Activity Tolerance Patient tolerated treatment well   Behavior During Therapy [facility] for tasks assessed/performed      Past Medical History:  Diagnosis Date  . Arthritis   . Carotid artery stenosis 11/2008   bilateral 40-59% stenosis  . Colon polyps   . Diverticulosis   . Hepatitis    doesn't know which type 1969  . Hyperlipidemia   . Hypertension   . Numbness    fingertips  . Phimosis   . Prostate cancer ([place]) 2001  . Urothelial carcinoma of left distal ureter [facility])     Past Surgical History:  Procedure Laterality Date  . BIOPSY  10/01/2011   Procedure: BIOPSY;  Surgeon: Dutch Kiauna Zywicki, MD;  Location: [facility];  Service: Urology;;  biopsy of bladder tumor  . BLADDER SUSPENSION  08/18/10   [name]  . CARDIAC CATHETERIZATION N/A 07/29/2015   Procedure: Right/Left Heart Cath and Coronary Angiography;  Surgeon: Peter M Martinique, MD;  Location: [place] CV LAB;  Service: Cardiovascular;  Laterality: N/A;  . CATARACT EXTRACTION W/ INTRAOCULAR LENS  IMPLANT, BILATERAL Bilateral   . CYSTOSCOPY W/ RETROGRADES  01/27/2012   Procedure: CYSTOSCOPY WITH RETROGRADE PYELOGRAM;  Surgeon: Dutch Mckayla Mulcahey, MD;  Location: [facility];  Service: Urology;  Laterality: Left;  . CYSTOSCOPY WITH RETROGRADE PYELOGRAM, URETEROSCOPY AND STENT PLACEMENT Left 11/24/2012   Procedure: CYSTOSCOPY WITH left RETROGRADE PYELOGRAM, bladder washings, ureteroscopy;   Surgeon: Dutch Srihitha Tagliaferri, MD;  Location: [facility];  Service: Urology;  Laterality: Left;  . CYSTOSCOPY WITH RETROGRADE PYELOGRAM, URETEROSCOPY AND STENT PLACEMENT Bilateral 11/19/2013   Procedure: CYSTOSCOPY WITH BILATERAL RETROGRADE PYELOGRAM,;  Surgeon: [name], MD;  Location: [facility];  Service: Urology;  Laterality: Bilateral;  . EYE SURGERY  09/24/10   left eye. "Retina surgery"  . INGUINAL HERNIA REPAIR  1960  . PROSTATE SURGERY     s/p laparoscopic prostate surgery  . SKIN CANCER EXCISION     a/p skin cancer right foot-non melanoma  . TEE WITHOUT CARDIOVERSION N/A 07/29/2015   Procedure: TRANSESOPHAGEAL ECHOCARDIOGRAM (TEE);  Surgeon: Pixie Casino, MD;  Location: [facility]COPY;  Service: Cardiovascular;  Laterality: N/A;  cath to follow  . TRANSURETHRAL RESECTION OF BLADDER TUMOR  10/01/2011   Procedure: TRANSURETHRAL RESECTION OF BLADDER TUMOR (TURBT);  Surgeon: Dutch Nickcole Bralley, MD;  Location: [facility];  Service: Urology;  Laterality: N/A;  . TRANSURETHRAL RESECTION OF BLADDER TUMOR N/A 11/19/2013   Procedure: TRANSURETHRAL RESECTION OF BLADDER TUMOR (TURBT);  Surgeon: [name], MD;  Location: [facility];  Service: Urology;  Laterality: N/A;  . URETEROSCOPY  01/27/2012   Procedure: URETEROSCOPY;  Surgeon: Dutch Breckyn Ticas, MD;  Location: [facility];  Service: Urology;  Laterality: Left;  CYSTO, Left RETROGRADE PYELOGRPHY, LEFT URETEROSCOPY   . URINARY SPHINCTER IMPLANT    . VARICOSE VEIN SURGERY      There were no vitals filed for this visit.  Subjective Assessment - 01/24/17 1144    Subjective I have a discomfort in the right groin.  I do not know the movement that generates the pain.    Patient Stated Goals improve strength and endurance.   Currently in Pain? Yes   Pain Score 1    Pain Location Groin   Pain Orientation Right   Pain Descriptors / Indicators Discomfort   Pain Type Acute pain   Pain Onset In the past 7 days   Pain Frequency Intermittent   Aggravating Factors  not sure    Pain Relieving Factors not sure   Multiple Pain Sites No            [facility] PT Assessment - 01/24/17 0001      Assessment   Medical Diagnosis R26.9 gait disturbance   Onset Date/Surgical Date 09/10/16   Prior Therapy none     Precautions   Precautions Other (comment)   Precaution Comments cancer     Restrictions   Weight Bearing Restrictions No     Home Environment   Living Environment Private residence     Prior Function   Level of Independence Independent     Cognition   Overall Cognitive Status Within Functional Limits for tasks assessed     Observation/Other Assessments   Focus Kelley Therapeutic Outcomes (FOTO)  therapist discretion with Merrilee Jansky balance is 50% risk of falls (47/56)  goal is >/= 52/56     Posture/Postural Control   Posture/Postural Control Postural limitations   Postural Limitations Rounded Shoulders;Forward head;Flexed trunk   Posture Comments able to correct forward head to min-mod     Strength   Right Shoulder Flexion 5/5   Right Shoulder ABduction 5/5   Right Shoulder External Rotation 4+/5   Left Shoulder Flexion 5/5   Left Shoulder ABduction 5/5   Left Shoulder External Rotation 4+/5   Right Hip Flexion 4+/5   Right Hip Extension 4/5   Right Hip ABduction 4/5   Left Hip Flexion 4+/5   Left Hip Extension 4/5   Left Hip ABduction 4/5   Right Knee Flexion 5/5   Right Knee Extension 5/5   Left Knee Flexion 5/5   Left Knee Extension 5/5     Palpation   SI assessment  Right ilium is rotated anteriorly     Berg Balance Test   Sit to Stand Able to stand without using hands and stabilize independently   Standing Unsupported Able to stand safely 2 minutes   Sitting with Back Unsupported but Feet Supported Kelley Floor or Stool Able to sit safely and securely 2 minutes   Stand to Sit Sits safely with minimal use of hands   Transfers Able to transfer safely, minor use of hands   Standing Unsupported with Eyes Closed Able to stand 10 seconds safely    Standing Ubsupported with Feet Together Able to place feet together independently and stand 1 minute safely   From Standing, Reach Forward with Outstretched Arm Can reach confidently >25 cm (10")   From Standing Position, Pick up Object from Floor Able to pick up shoe safely and easily   From Standing Position, Turn to Look Behind Over each Shoulder Looks behind from both sides and weight shifts well   Turn 360 Degrees Able to turn 360 degrees safely in 4 seconds or less   Standing Unsupported, Alternately Place Feet Kelley Step/Stool Able to stand independently and safely and complete 8 steps in 20 seconds   Standing Unsupported, One Foot in  Front Able to plae foot ahead of the other independently and hold 30 seconds   Standing Kelley One Leg Tries to lift leg/unable to hold 3 seconds but remains standing independently   Total Score 52     Timed Up and Go Test   TUG Normal TUG   Normal TUG (seconds) 13                     [facility] Adult PT Treatment/Exercise - 02-13-2017 0001      Self-Care   Self-Care Other Self-Care Comments   Other Self-Care Comments  information Kelley compression stockings     Manual Therapy   Manual Therapy Muscle Energy Technique   Muscle Energy Technique correct right ilium                PT Education - 02-13-2017 1223    Education provided Yes   Education Details balance exercises; information Kelley compression stockings   Person(s) Educated Patient   Methods Explanation;Demonstration;Verbal cues;Handout   Comprehension Returned demonstration;Verbalized understanding             PT Long Term Goals - 2017/02/13 1227      PT LONG TERM GOAL #1   Title understand how to exercise at the gym with correct body mechanics and progress himself   Time 4   Period Weeks   Status Achieved     PT LONG TERM GOAL #2   Title TUG score is </= 13 seconds due to increased strength   Time 4   Period Weeks   Status Achieved     PT LONG TERM GOAL #3   Title  Berg balance >/= 52/56 due to ability to stand Kelley leg and tandem stance longer and steadier   Time 4   Period Weeks   Status Achieved     PT LONG TERM GOAL #4   Title patient reports his endurance for daily acitivities has improved by 25% due to increased overall strength   Time 4   Period Weeks   Status Achieved               Plan - 2017-02-13 1139    Clinical Impression Statement Patient has increased strength of the lower and upper extremities.  Patient has improved balance to 52/56.  Patient has difficulty with tandem and single leg stance.  Patient has met his goals. Patient will be going out of the country next week for indefinite time. Patient has difficulty with walking and feeling like he will go forward due to forward head posture.  Patient pelvis is in correct alignment after therapy and right groin pain was dispersed.  Patient is ready for discharged.    Rehab Potential Excellent   Clinical Impairments Affecting Rehab Potential history of prostate cancer   PT Treatment/Interventions Therapeutic activities;Therapeutic exercise;Balance training;Neuromuscular re-education;Patient/family education;Manual techniques;Gait training   PT Next Visit Plan Discharge to HEP this visit   PT Home Exercise Plan Current HEP   Consulted and Agree with Plan of Care Patient      Patient will benefit from skilled therapeutic intervention in order to improve the following deficits and impairments:  Decreased strength, Decreased balance, Difficulty walking, Postural dysfunction  Visit Diagnosis: Abnormal posture  Muscle weakness (generalized)  Abnormality of gait       G-Codes - 02/13/2017 1229    Functional Assessment Tool Used (Outpatient Only) FOTO is 30% limitatuion due to 52/56 berg balance score   Functional Limitation Mobility: Walking and moving around  Mobility: Walking and Moving Around Goal Status [phone]) At least 20 percent but less than 40 percent impaired, limited or  restricted   Mobility: Walking and Moving Around Discharge Status [phone]) At least 20 percent but less than 40 percent impaired, limited or restricted      Problem List Patient Active Problem List   Diagnosis Date Noted  . Closed pilon fracture, left, sequela 10/11/2016  . Pain in left ankle and joints of left foot 10/04/2016  . 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 Fatigue 11/04/2015  . CAD (coronary artery disease) 08/01/2015  . Pulmonary hypertension ([place]) 07/24/2015  . Atrial fibrillation ([place]) 07/16/2015  . Aortic insufficiency 01/04/2014  . Squamous cell carcinoma in situ of skin of right lower leg 11/13/2013  . Lentigo maligna of cheek ([place]) 09/19/2013  . Actinic keratosis 11/06/2012  . Macular degeneration 08/10/2012  . Allergic rhinitis 08/10/2012  . Bladder cancer ([place]) 10/19/2011  . Eczema 02/23/2011  . Hyperglycemia 02/11/2011  . Heart murmur 09/10/2010  . ABNORMAL THYROID FUNCTION TESTS 08/05/2009  . ABNORMAL HEART RHYTHMS 07/11/2009  . Hyperlipidemia 11/28/2008  . CAROTID BRUIT 11/19/2008  . UNSPECIFIED CATARACT 10/17/2008  . PARESTHESIA 12/04/2007  . Varicose veins of bilateral lower extremities with other complications 38/33/3832  . COLONIC POLYPS, HX OF 08/28/2007  . Essential hypertension 02/08/2007  . PROSTATE CANCER, HX OF 11/04/2006  . INGUINAL HERNIA, HX OF 11/04/2006    Jeremy Kelley, PT 01/24/17 12:34 PM   [facility] [address] [place], [place], [address] Phone: [phone]   Fax:  [phone]  Name: Jeremy Kelley MRN: [id number] Date of Birth: [birth year]  PHYSICAL THERAPY DISCHARGE SUMMARY  Visits from Start of Care: 4  Current functional level related to goals / functional outcomes: See above.  Patient has met his goals.    Remaining deficits: Patient will feel like he will fall forward but is  better when he stands upright.    Education / Equipment: HEP Plan: Patient agrees to discharge.  Patient goals were met. Patient is being discharged due to meeting the stated rehab goals.  Thank you for the referral. Jeremy Kelley, PT 01/24/17 12:34 PM  ?????

## 2017-01-25 DIAGNOSIS — L814 Other melanin hyperpigmentation: Secondary | ICD-10-CM | POA: Diagnosis not present

## 2017-01-25 DIAGNOSIS — D225 Melanocytic nevi of trunk: Secondary | ICD-10-CM | POA: Diagnosis not present

## 2017-01-25 DIAGNOSIS — L821 Other seborrheic keratosis: Secondary | ICD-10-CM | POA: Diagnosis not present

## 2017-01-25 DIAGNOSIS — L570 Actinic keratosis: Secondary | ICD-10-CM | POA: Diagnosis not present

## 2017-01-26 ENCOUNTER — Encounter: Payer: Medicare Other | Admitting: Physical Therapy

## 2017-01-26 DIAGNOSIS — N3946 Mixed incontinence: Secondary | ICD-10-CM | POA: Diagnosis not present

## 2017-01-26 DIAGNOSIS — R350 Frequency of micturition: Secondary | ICD-10-CM | POA: Diagnosis not present

## 2017-01-31 ENCOUNTER — Encounter: Payer: Medicare Other | Admitting: Physical Therapy

## 2017-01-31 DIAGNOSIS — N3946 Mixed incontinence: Secondary | ICD-10-CM | POA: Diagnosis not present

## 2017-01-31 DIAGNOSIS — R310 Gross hematuria: Secondary | ICD-10-CM | POA: Diagnosis not present

## 2017-01-31 DIAGNOSIS — R350 Frequency of micturition: Secondary | ICD-10-CM | POA: Diagnosis not present

## 2017-02-01 ENCOUNTER — Encounter: Payer: Self-pay | Admitting: Cardiovascular Disease

## 2017-02-01 ENCOUNTER — Encounter (INDEPENDENT_AMBULATORY_CARE_PROVIDER_SITE_OTHER): Payer: Self-pay

## 2017-02-01 ENCOUNTER — Ambulatory Visit (INDEPENDENT_AMBULATORY_CARE_PROVIDER_SITE_OTHER): Payer: Medicare Other | Admitting: Cardiovascular Disease

## 2017-02-01 ENCOUNTER — Other Ambulatory Visit: Payer: Medicare Other

## 2017-02-01 VITALS — BP 124/60 | HR 78 | Ht 70.0 in | Wt 127.0 lb

## 2017-02-01 DIAGNOSIS — I482 Chronic atrial fibrillation, unspecified: Secondary | ICD-10-CM

## 2017-02-01 DIAGNOSIS — I351 Nonrheumatic aortic (valve) insufficiency: Secondary | ICD-10-CM

## 2017-02-01 NOTE — Patient Instructions (Signed)
Medication Instructions:  Your physician recommends that you continue on your current medications as directed. Please refer to the Current Medication list given to you today.   Labwork: None Ordered   Testing/Procedures: None Ordered   Follow-Up: Your physician recommends that you schedule a follow-up appointment in: 3-4 months with Dr. Acie Fredrickson   If you need a refill on your cardiac medications before your next appointment, please call your pharmacy.   Thank you for choosing [facility]! Christen Bame, RN [phone]
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
## 2017-02-02 ENCOUNTER — Encounter: Payer: Self-pay | Admitting: Adult Health

## 2017-02-02 ENCOUNTER — Encounter: Payer: Medicare Other | Admitting: Physical Therapy

## 2017-02-02 ENCOUNTER — Ambulatory Visit (INDEPENDENT_AMBULATORY_CARE_PROVIDER_SITE_OTHER): Payer: Medicare Other | Admitting: Adult Health

## 2017-02-02 VITALS — BP 100/70 | Temp 97.8°F | Ht 70.0 in | Wt 128.0 lb

## 2017-02-02 DIAGNOSIS — R600 Localized edema: Secondary | ICD-10-CM

## 2017-02-02 DIAGNOSIS — Z23 Encounter for immunization: Secondary | ICD-10-CM

## 2017-02-02 DIAGNOSIS — Z760 Encounter for issue of repeat prescription: Secondary | ICD-10-CM

## 2017-02-02 DIAGNOSIS — I10 Essential (primary) hypertension: Secondary | ICD-10-CM | POA: Diagnosis not present

## 2017-02-02 DIAGNOSIS — E785 Hyperlipidemia, unspecified: Secondary | ICD-10-CM

## 2017-02-02 DIAGNOSIS — I482 Chronic atrial fibrillation, unspecified: Secondary | ICD-10-CM

## 2017-02-02 MED ORDER — MIRABEGRON ER 50 MG PO TB24: 50.0000 mg | ORAL_TABLET | Freq: Every day | ORAL | 0 refills | Status: DC

## 2017-02-02 MED ORDER — AMLODIPINE BESYLATE 2.5 MG PO TABS: 2.5000 mg | ORAL_TABLET | Freq: Every day | ORAL | 0 refills | Status: DC

## 2017-02-02 MED ORDER — METOPROLOL SUCCINATE ER 25 MG PO TB24: 25.0000 mg | ORAL_TABLET | Freq: Every day | ORAL | 0 refills | Status: DC

## 2017-02-02 MED ORDER — KLOR-CON M10 10 MEQ PO TBCR: 10.0000 meq | EXTENDED_RELEASE_TABLET | Freq: Every day | ORAL | 0 refills | Status: DC

## 2017-02-02 MED ORDER — FUROSEMIDE 40 MG PO TABS: 40.0000 mg | ORAL_TABLET | Freq: Every day | ORAL | 0 refills | Status: DC

## 2017-02-02 MED ORDER — APIXABAN 2.5 MG PO TABS: 2.5000 mg | ORAL_TABLET | Freq: Two times a day (BID) | ORAL | 0 refills | Status: DC

## 2017-02-02 MED ORDER — PRAVASTATIN SODIUM 20 MG PO TABS: 20.0000 mg | ORAL_TABLET | Freq: Every day | ORAL | 0 refills | Status: DC

## 2017-02-02 NOTE — Progress Notes (Signed)
Subjective:    Patient ID: Jeremy Kelley, male    DOB: 13-Mar-1927, 83 y.o.   MRN: [id number]  HPI  83 year old male who  has a past medical history of Arthritis; Carotid artery stenosis (11/2008); Colon polyps; Diverticulosis; Hepatitis; Hyperlipidemia; Hypertension; Numbness; Phimosis; Prostate cancer ([place]) (2001); and Urothelial carcinoma of left distal ureter ([place]).  He presents to that office today for follow up regarding medications. He is leaving next week to spend the winter in [place]. He is going to need medication coverage until he comes back at the end of March. He is excited to get back to [place]   He has no acute complaints today. Denies any chest pain. He has some fatigue and DOE for the past several months. Does not feel as though it is getting any worse.   He reports that he has seen his urologist and cardiologist within the last two weeks.   Review of Systems See HPI   Past Medical History:  Diagnosis Date  . Arthritis   . Carotid artery stenosis 11/2008   bilateral 40-59% stenosis  . Colon polyps   . Diverticulosis   . Hepatitis    doesn't know which type 1969  . Hyperlipidemia   . Hypertension   . Numbness    fingertips  . Phimosis   . Prostate cancer ([place]) 2001  . Urothelial carcinoma of left distal ureter [facility])     Social History   Social History  . Marital status: Single    Spouse name: N/A  . Number of children: 0  . Years of education: N/A   Occupational History  . Retired    Social History Main Topics  . Smoking status: Former Research scientist (life sciences)  . Smokeless tobacco: Never Used     Comment: 07/29/2015 "might smoke a cigar once/year"  . Alcohol use 8.4 oz/week    14 Glasses of wine per week     Comment: 07/29/2015 "big glass of wine q night"  . Drug use: No  . Sexual activity: Not on file   Other Topics Concern  . Not on file   Social History Narrative   Patient is single, has never been married.  Does not have any children.  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   He drinks approximately 3-4 glasses of wine per week.  No history of excessive alcohol use.  Occasionally smokes a cigar.   Spends winters in [place]         Past Surgical History:  Procedure Laterality Date  . BIOPSY  10/01/2011   Procedure: BIOPSY;  Surgeon: [name], MD;  [facility];  Service: Urology;;  biopsy of bladder tumor  . BLADDER SUSPENSION  08/18/10   [name]  . CARDIAC CATHETERIZATION N/A 07/29/2015   Procedure: Right/Left Heart Cath and Coronary Angiography;  Surgeon: Peter M Martinique, MD;  Location: [facility];  Service: Cardiovascular;  Laterality: N/A;  . CATARACT EXTRACTION W/ INTRAOCULAR LENS  IMPLANT, BILATERAL Bilateral   . CYSTOSCOPY W/ RETROGRADES  01/27/2012   Procedure: CYSTOSCOPY WITH RETROGRADE PYELOGRAM;  Surgeon: [name], MD;  [facility];  Service: Urology;  Laterality: Left;  . CYSTOSCOPY WITH RETROGRADE PYELOGRAM, URETEROSCOPY AND STENT PLACEMENT Left 11/24/2012   Procedure: CYSTOSCOPY WITH left RETROGRADE PYELOGRAM, bladder washings, ureteroscopy;  Surgeon: [name], MD;  [facility];  Service: Urology;  Laterality: Left;  . CYSTOSCOPY WITH RETROGRADE PYELOGRAM, URETEROSCOPY AND STENT PLACEMENT Bilateral 11/19/2013   Procedure:  [place],;  Surgeon: [name], MD;  [facility];  Service: Urology;  Laterality: Bilateral;  . EYE SURGERY  09/24/10   left eye. "Retina surgery"  . INGUINAL HERNIA REPAIR  1960  . PROSTATE SURGERY     s/p laparoscopic prostate surgery  . SKIN CANCER EXCISION     a/p skin cancer right foot-non melanoma  . TEE WITHOUT CARDIOVERSION N/A 07/29/2015   Procedure: TRANSESOPHAGEAL ECHOCARDIOGRAM (TEE);  Surgeon: Pixie Casino, MD;  Location: [facility] ENDOSCOPY;  Service: Cardiovascular;  Laterality: N/A;  cath to follow  . TRANSURETHRAL RESECTION OF BLADDER TUMOR  10/01/2011   Procedure: TRANSURETHRAL RESECTION OF  BLADDER TUMOR (TURBT);  Surgeon: [name], MD;  [facility];  Service: Urology;  Laterality: N/A;  . TRANSURETHRAL RESECTION OF BLADDER TUMOR N/A 11/19/2013   Procedure: TRANSURETHRAL RESECTION OF BLADDER TUMOR (TURBT);  Surgeon: [name], MD;  [facility];  Service: Urology;  Laterality: N/A;  . URETEROSCOPY  01/27/2012   Procedure: URETEROSCOPY;  Surgeon: [name], MD;  [facility];  Service: Urology;  Laterality: Left;  CYSTO, Left RETROGRADE PYELOGRPHY, LEFT URETEROSCOPY   . URINARY SPHINCTER IMPLANT    . VARICOSE VEIN SURGERY      Family History  Problem Relation Age of Onset  . Colon cancer Neg Hx   . Colon polyps Neg Hx     No Known Allergies  Current Outpatient Prescriptions on File Prior to Visit  Medication Sig Dispense Refill  . amLODipine (NORVASC) 2.5 MG tablet Take 1 tablet (2.5 mg total) by mouth daily. 90 tablet 3  . apixaban (ELIQUIS) 2.5 MG TABS tablet Take 1 tablet (2.5 mg total) by mouth 2 (two) times daily. 180 tablet 3  . B Complex-C (B-COMPLEX WITH VITAMIN C) tablet Take 1 tablet by mouth daily. Reported on 07/24/2015 90 tablet 3  . cholecalciferol (VITAMIN D) 1000 units tablet Take 1 tablet (1,000 Units total) by mouth daily. 90 tablet 3  . methylcellulose (ARTIFICIAL TEARS) 1 % ophthalmic solution Place 1 drop into both eyes 2 (two) times daily.     . metoprolol succinate (TOPROL-XL) 25 MG 24 hr tablet Take 1 tablet (25 mg total) by mouth at bedtime. 90 tablet 3  . mirabegron ER (MYRBETRIQ) 50 MG TB24 tablet Take 1 tablet (50 mg total) by mouth daily. 90 tablet 3  . Multiple Vitamins-Minerals (PRESERVISION/LUTEIN PO) Take 1 tablet by mouth daily.    . pravastatin (PRAVACHOL) 20 MG tablet Take 1 tablet (20 mg total) by mouth at bedtime. 90 tablet 3  . furosemide (LASIX) 40 MG tablet Take 1 tablet (40 mg total) by mouth daily. 90 tablet 3  . KLOR-CON M10 10 MEQ tablet Take 1 tablet (10 mEq total) by mouth once. 90 tablet 3   No current  facility-administered medications on file prior to visit.     BP 100/70   Temp 97.8 F (36.6 C) (Oral)   Ht 5\' 10"  (1.778 m)   Wt 128 lb (58.1 kg)   BMI 18.37 kg/m       Objective:   Physical Exam  Constitutional: He is oriented to person, place, and time. He appears well-developed and well-nourished. No distress.  Cardiovascular: Normal rate and intact distal pulses.  An irregularly irregular rhythm present. Exam reveals no gallop and no friction rub.   Murmur heard. Pulmonary/Chest: Effort normal and breath sounds normal. No respiratory distress. He has no wheezes. He has no rales. He exhibits no tenderness.  Musculoskeletal: Normal range of motion. He exhibits no edema or tenderness.  Neurological: He is alert and oriented to person, place, and time.  Skin: Skin is warm and dry. No rash noted. He is not diaphoretic. No erythema. No pallor.  Psychiatric: He has a normal mood and affect. His behavior is normal. Judgment and thought content normal.  Nursing note and vitals reviewed.     Assessment & Plan:  1. Need for prophylactic vaccination and inoculation against influenza  - Flu vaccine HIGH DOSE PF (Fluzone High dose)  2. Lower extremity edema  - KLOR-CON M10 10 MEQ tablet; Take 1 tablet (10 mEq total) by mouth daily.  Dispense: 151 tablet; Refill: 0 - furosemide (LASIX) 40 MG tablet; Take 1 tablet (40 mg total) by mouth daily.  Dispense: 151 tablet; Refill: 0  3. Medication refill  - KLOR-CON M10 10 MEQ tablet; Take 1 tablet (10 mEq total) by mouth daily.  Dispense: 151 tablet; Refill: 0 - apixaban (ELIQUIS) 2.5 MG TABS tablet; Take 1 tablet (2.5 mg total) by mouth 2 (two) times daily.  Dispense: 302 tablet; Refill: 0 - amLODipine (NORVASC) 2.5 MG tablet; Take 1 tablet (2.5 mg total) by mouth daily.  Dispense: 151 tablet; Refill: 0 - pravastatin (PRAVACHOL) 20 MG tablet; Take 1 tablet (20 mg total) by mouth at bedtime.  Dispense: 151 tablet; Refill: 0 - metoprolol  succinate (TOPROL-XL) 25 MG 24 hr tablet; Take 1 tablet (25 mg total) by mouth at bedtime.  Dispense: 151 tablet; Refill: 0 - furosemide (LASIX) 40 MG tablet; Take 1 tablet (40 mg total) by mouth daily.  Dispense: 151 tablet; Refill: 0 - mirabegron ER (MYRBETRIQ) 50 MG TB24 tablet; Take 1 tablet (50 mg total) by mouth daily.  Dispense: 151 tablet; Refill: 0  4. Chronic atrial fibrillation (HCC)  - apixaban (ELIQUIS) 2.5 MG TABS tablet; Take 1 tablet (2.5 mg total) by mouth 2 (two) times daily.  Dispense: 302 tablet; Refill: 0 - metoprolol succinate (TOPROL-XL) 25 MG 24 hr tablet; Take 1 tablet (25 mg total) by mouth at bedtime.  Dispense: 151 tablet; Refill: 0  5. Essential hypertension  - amLODipine (NORVASC) 2.5 MG tablet; Take 1 tablet (2.5 mg total) by mouth daily.  Dispense: 151 tablet; Refill: 0 - metoprolol succinate (TOPROL-XL) 25 MG 24 hr tablet; Take 1 tablet (25 mg total) by mouth at bedtime.  Dispense: 151 tablet; Refill: 0 - furosemide (LASIX) 40 MG tablet; Take 1 tablet (40 mg total) by mouth daily.  Dispense: 151 tablet; Refill: 0  6. Hyperlipidemia, unspecified hyperlipidemia type  - pravastatin (PRAVACHOL) 20 MG tablet; Take 1 tablet (20 mg total) by mouth at bedtime.  Dispense: 151 tablet; Refill: 0   [name], NP

## 2017-02-04 DIAGNOSIS — H35373 Puckering of macula, bilateral: Secondary | ICD-10-CM | POA: Diagnosis not present

## 2017-02-04 DIAGNOSIS — H52203 Unspecified astigmatism, bilateral: Secondary | ICD-10-CM | POA: Diagnosis not present

## 2017-02-04 DIAGNOSIS — H04123 Dry eye syndrome of bilateral lacrimal glands: Secondary | ICD-10-CM | POA: Diagnosis not present

## 2017-02-04 DIAGNOSIS — H43813 Vitreous degeneration, bilateral: Secondary | ICD-10-CM | POA: Diagnosis not present

## 2017-02-07 ENCOUNTER — Encounter: Payer: Medicare Other | Admitting: Physical Therapy

## 2017-02-09 ENCOUNTER — Encounter: Payer: Medicare Other | Admitting: Physical Therapy

## 2017-04-15 ENCOUNTER — Other Ambulatory Visit: Payer: Self-pay | Admitting: Adult Health

## 2017-04-26 ENCOUNTER — Encounter: Payer: Self-pay | Admitting: Adult Health

## 2017-04-26 ENCOUNTER — Ambulatory Visit (INDEPENDENT_AMBULATORY_CARE_PROVIDER_SITE_OTHER): Payer: Medicare Other | Admitting: Adult Health

## 2017-04-26 VITALS — BP 140/60 | HR 74 | Temp 97.4°F | Ht 70.0 in | Wt 106.2 lb

## 2017-04-26 DIAGNOSIS — K4090 Unilateral inguinal hernia, without obstruction or gangrene, not specified as recurrent: Secondary | ICD-10-CM | POA: Diagnosis not present

## 2017-04-26 DIAGNOSIS — Z85828 Personal history of other malignant neoplasm of skin: Secondary | ICD-10-CM

## 2017-04-26 DIAGNOSIS — M549 Dorsalgia, unspecified: Secondary | ICD-10-CM | POA: Diagnosis not present

## 2017-04-26 DIAGNOSIS — G8929 Other chronic pain: Secondary | ICD-10-CM

## 2017-04-26 NOTE — Progress Notes (Signed)
Subjective:    Patient ID: Jeremy Kelley, male    DOB: [birth year], 84 y.o.   MRN: [id number]  HPI  84 year old male who  has a past medical history of Arthritis, Carotid artery stenosis (11/2008), Colon polyps, Diverticulosis, Hepatitis, Hyperlipidemia, Hypertension, Numbness, Phimosis, Prostate cancer ([place]) (2001), and Urothelial carcinoma of left distal ureter ([place]).  He presents to the office today for follow up after returning from [place], where he spent the last few months. He reports that when he was down there he noticed a small bulge in his right groin. He denies any pain, constipation, or skin changes.   He also complains of upper and lower back pain that has been more noticeable when he walks. His back pain is chronic in nature but has been more painful as of lately.   In addition to this he would also like a referral to a new dermatologist. He received a letter from [facility] that Dr. Allyson Sabal was retiring. He would like to go to a new practice.    Review of Systems See HPI   Past Medical History:  Diagnosis Date  . Arthritis   . Carotid artery stenosis 11/2008   bilateral 40-59% stenosis  . Colon polyps   . Diverticulosis   . Hepatitis    doesn't know which type 1969  . Hyperlipidemia   . Hypertension   . Numbness    fingertips  . Phimosis   . Prostate cancer ([place]) 2001  . Urothelial carcinoma of left distal ureter [facility])     Social History   Socioeconomic History  . Marital status: Single    Spouse name: Not on file  . Number of children: 0  . Years of education: Not on file  . Highest education level: Not on file  Social Needs  . Financial resource strain: Not on file  . Food insecurity - worry: Not on file  . Food insecurity - inability: Not on file  . Transportation needs - medical: Not on file  . Transportation needs - non-medical: Not on file  Occupational History  . Occupation: Retired  Tobacco Use  . Smoking status: Former Research scientist (life sciences)    . Smokeless tobacco: Never Used  . Tobacco comment: 07/29/2015 "might smoke a cigar once/year"  Substance and Sexual Activity  . Alcohol use: Yes    Alcohol/week: 8.4 oz    Types: 14 Glasses of wine per week    Comment: 07/29/2015 "big glass of wine q night"  . Drug use: No  . Sexual activity: Not on file  Other Topics Concern  . Not on file  Social History Narrative   Patient is single, has never been married.  Does not have any children.  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   He drinks approximately 3-4 glasses of wine per week.  No history of excessive alcohol use.  Occasionally smokes a cigar.   Spends winters in [place]         Past Surgical History:  Procedure Laterality Date  . BIOPSY  10/01/2011   Procedure: BIOPSY;  Surgeon: [name], MD;  Location: [facility];  Service: Urology;;  biopsy of bladder tumor  . BLADDER SUSPENSION  08/18/10   [name]  . CARDIAC CATHETERIZATION N/A 07/29/2015   Procedure: Right/Left Heart Cath and Coronary Angiography;  Surgeon: Peter M Martinique, MD;  Location: [facility];  Service: Cardiovascular;  Laterality: N/A;  . CATARACT EXTRACTION W/ INTRAOCULAR LENS  IMPLANT, BILATERAL  Bilateral   . CYSTOSCOPY W/ RETROGRADES  01/27/2012   Procedure: CYSTOSCOPY WITH RETROGRADE PYELOGRAM;  Surgeon: [name], MD;  Location: [facility];  Service: Urology;  Laterality: Left;  . CYSTOSCOPY WITH RETROGRADE PYELOGRAM, URETEROSCOPY AND STENT PLACEMENT Left 11/24/2012   Procedure: CYSTOSCOPY WITH left RETROGRADE PYELOGRAM, bladder washings, ureteroscopy;  Surgeon: [name], MD;  [facility];  Service: Urology;  Laterality: Left;  . CYSTOSCOPY WITH RETROGRADE PYELOGRAM, URETEROSCOPY AND STENT PLACEMENT Bilateral 11/19/2013   Procedure: CYSTOSCOPY WITH BILATERAL RETROGRADE PYELOGRAM,;  Surgeon: [name], MD;  Location: [facility];  Service: Urology;  Laterality: Bilateral;  . EYE SURGERY  09/24/10   left eye. "Retina surgery"  .  INGUINAL HERNIA REPAIR  1960  . PROSTATE SURGERY     s/p laparoscopic prostate surgery  . SKIN CANCER EXCISION     a/p skin cancer right foot-non melanoma  . TEE WITHOUT CARDIOVERSION N/A 07/29/2015   Procedure: TRANSESOPHAGEAL ECHOCARDIOGRAM (TEE);  Surgeon: Pixie Casino, MD;  Location: [facility]OS[facility];  Service: Cardiovascular;  Laterality: N/A;  cath to follow  . TRANSURETHRAL RESECTION OF BLADDER TUMOR  10/01/2011   Procedure: TRANSURETHRAL RESECTION OF BLADDER TUMOR (TURBT);  Surgeon: [name], MD;  [facility];  Service: Urology;  Laterality: N/A;  . TRANSURETHRAL RESECTION OF BLADDER TUMOR N/A 11/19/2013   Procedure: TRANSURETHRAL RESECTION OF BLADDER TUMOR (TURBT);  Surgeon: [name], MD;  [facility];  Service: Urology;  Laterality: N/A;  . URETEROSCOPY  01/27/2012   Procedure: URETEROSCOPY;  Surgeon: [name], MD;  [facility];  Service: Urology;  Laterality: Left;  CYSTO, Left RETROGRADE PYELOGRPHY, LEFT URETEROSCOPY   . URINARY SPHINCTER IMPLANT    . VARICOSE VEIN SURGERY      Family History  Problem Relation Age of Onset  . Colon cancer Neg Hx   . Colon polyps Neg Hx     No Known Allergies  Current Outpatient Medications on File Prior to Visit  Medication Sig Dispense Refill  . amLODipine (NORVASC) 2.5 MG tablet Take 1 tablet (2.5 mg total) by mouth daily. 151 tablet 0  . apixaban (ELIQUIS) 2.5 MG TABS tablet Take 1 tablet (2.5 mg total) by mouth 2 (two) times daily. 302 tablet 0  . B Complex-C (B-COMPLEX WITH VITAMIN C) tablet Take 1 tablet by mouth daily. Reported on 07/24/2015 90 tablet 3  . cholecalciferol (VITAMIN D) 1000 units tablet Take 1 tablet (1,000 Units total) by mouth daily. 90 tablet 3  . furosemide (LASIX) 40 MG tablet Take 1 tablet (40 mg total) by mouth daily. 151 tablet 0  . KLOR-CON M10 10 MEQ tablet Take 1 tablet (10 mEq total) by mouth daily. 151 tablet 0  . methylcellulose (ARTIFICIAL TEARS) 1 % ophthalmic solution Place 1 drop  into both eyes 2 (two) times daily.     . metoprolol succinate (TOPROL-XL) 25 MG 24 hr tablet Take 1 tablet (25 mg total) by mouth at bedtime. 151 tablet 0  . mirabegron ER (MYRBETRIQ) 50 MG TB24 tablet Take 1 tablet (50 mg total) by mouth daily. 151 tablet 0  . Multiple Vitamins-Minerals (PRESERVISION/LUTEIN PO) Take 1 tablet by mouth daily.    . pravastatin (PRAVACHOL) 20 MG tablet Take 1 tablet (20 mg total) by mouth at bedtime. 151 tablet 0   No current facility-administered medications on file prior to visit.     BP 140/60 (BP Location: Left Arm, Patient Position: Sitting, Cuff Size: Normal)   Pulse 74   Temp (!) 97.4 F (  36.3 C) (Oral)   Ht 5\' 10"  (1.778 m)   Wt 106 lb 3.2 oz (48.2 kg)   SpO2 100%   BMI 15.24 kg/m       Objective:   Physical Exam  Constitutional: He is oriented to person, place, and time. He appears well-developed and well-nourished. No distress.  Cardiovascular: Normal rate, regular rhythm and intact distal pulses. Exam reveals no gallop and no friction rub.  Murmur heard. Pulmonary/Chest: Effort normal and breath sounds normal. No respiratory distress. He has no wheezes. He has no rales. He exhibits no tenderness.  Abdominal: Soft. Normal appearance and bowel sounds are normal. A hernia is present. Hernia confirmed positive in the right inguinal area (easily reducable ).  Neurological: He is alert and oriented to person, place, and time.  Skin: Skin is warm and dry. No rash noted. He is not diaphoretic. No erythema. No pallor.  Multiple small skin growths on lower legs   Psychiatric: He has a normal mood and affect. His behavior is normal. Judgment and thought content normal.  Nursing note and vitals reviewed.      Assessment & Plan:  1. Hx of skin cancer, basal cell  - Ambulatory referral to Dermatology  2. Non-recurrent unilateral inguinal hernia without obstruction or gangrene - Very small inguinal hernia. I advised that he likely does not need  surgery. Since he is home he would like to get a surgeons opinion.  - Ambulatory referral to General Surgery  3. Other chronic back pain - Chronic in nature  - Consider orthopedics  - DG Lumbar Spine Complete; Future - DG Thoracic Spine 2 View; Future  [name], NP

## 2017-04-28 ENCOUNTER — Ambulatory Visit (INDEPENDENT_AMBULATORY_CARE_PROVIDER_SITE_OTHER)
Admission: RE | Admit: 2017-04-28 | Discharge: 2017-04-28 | Disposition: A | Discharge status: Home / Self Care | Payer: Medicare Other | Source: Ambulatory Visit | Attending: Adult Health | Admitting: Adult Health

## 2017-04-28 DIAGNOSIS — G8929 Other chronic pain: Secondary | ICD-10-CM

## 2017-04-28 DIAGNOSIS — M549 Dorsalgia, unspecified: Secondary | ICD-10-CM

## 2017-04-28 DIAGNOSIS — M545 Low back pain: Secondary | ICD-10-CM | POA: Diagnosis not present

## 2017-04-28 DIAGNOSIS — S3992XA Unspecified injury of lower back, initial encounter: Secondary | ICD-10-CM | POA: Diagnosis not present

## 2017-04-28 DIAGNOSIS — S299XXA Unspecified injury of thorax, initial encounter: Secondary | ICD-10-CM | POA: Diagnosis not present

## 2017-05-04 ENCOUNTER — Other Ambulatory Visit: Payer: Self-pay | Admitting: Dermatology

## 2017-05-04 DIAGNOSIS — C44722 Squamous cell carcinoma of skin of right lower limb, including hip: Secondary | ICD-10-CM | POA: Diagnosis not present

## 2017-05-05 ENCOUNTER — Encounter: Payer: Self-pay | Admitting: Adult Health

## 2017-05-05 ENCOUNTER — Ambulatory Visit (INDEPENDENT_AMBULATORY_CARE_PROVIDER_SITE_OTHER): Payer: Medicare Other | Admitting: Adult Health

## 2017-05-05 VITALS — BP 140/82 | Temp 98.4°F | Wt 127.0 lb

## 2017-05-05 DIAGNOSIS — G8929 Other chronic pain: Secondary | ICD-10-CM | POA: Diagnosis not present

## 2017-05-05 DIAGNOSIS — M549 Dorsalgia, unspecified: Secondary | ICD-10-CM | POA: Diagnosis not present

## 2017-05-05 DIAGNOSIS — K4090 Unilateral inguinal hernia, without obstruction or gangrene, not specified as recurrent: Secondary | ICD-10-CM | POA: Diagnosis not present

## 2017-05-05 NOTE — Progress Notes (Signed)
Subjective:    Patient ID: Jeremy Kelley, male    DOB: 05-28-1926, 84 y.o.   MRN: [id number]  HPI  84 year old male who  has a past medical history of Arthritis, Carotid artery stenosis (11/2008), Colon polyps, Diverticulosis, Hepatitis, Hyperlipidemia, Hypertension, Numbness, Phimosis, Prostate cancer ([place]) (2001), and Urothelial carcinoma of left distal ureter ([place]).  He presents today for follow-up to review thoracic and lumbar spine x-rays.  Thoracic spine x-ray showed:  FINDINGS: There is mild S shaped scoliosis of the thoracic spine, not associated with acute fracture or vertebral anomaly. No suspicious lytic or blastic lesions are identified. Mild midthoracic degenerative changes are present.  IMPRESSION: No evidence for acute  abnormality.  Bar spine x-ray showed:  FINDINGS: There is significant disc height loss at L2-3, L3-4, L4-5. No spondylolisthesis. No acute fracture. Significant stool burden.  IMPRESSION: Moderate mid lumbar spondylosis. No evidence for acute abnormality.  Continues to complain of "discomfort" in his mid back.  He feels as though when he was exercising on a regular basis he did not have this discomfort  and since he has not been exercising the discomfort has started to become worse.  Ports that the discomfort is not enough to keep him from performing activities of daily living  Review of Systems See HPI   Past Medical History:  Diagnosis Date  . Arthritis   . Carotid artery stenosis 11/2008   bilateral 40-59% stenosis  . Colon polyps   . Diverticulosis   . Hepatitis    doesn't know which type 1969  . Hyperlipidemia   . Hypertension   . Numbness    fingertips  . Phimosis   . Prostate cancer ([place]) 2001  . Urothelial carcinoma of left distal ureter [facility])     Social History   Socioeconomic History  . Marital status: Single    Spouse name: Not on file  . Number of children: 0  . Years of education: Not on file  . Highest  education level: Not on file  Social Needs  . Financial resource strain: Not on file  . Food insecurity - worry: Not on file  . Food insecurity - inability: Not on file  . Transportation needs - medical: Not on file  . Transportation needs - non-medical: Not on file  Occupational History  . Occupation: Retired  Tobacco Use  . Smoking status: Former Research scientist (life sciences)  . Smokeless tobacco: Never Used  . Tobacco comment: 07/29/2015 "might smoke a cigar once/year"  Substance and Sexual Activity  . Alcohol use: Yes    Alcohol/week: 8.4 oz    Types: 14 Glasses of wine per week    Comment: 07/29/2015 "big glass of wine q night"  . Drug use: No  . Sexual activity: Not on file  Other Topics Concern  . Not on file  Social History Narrative   Patient is single, has never been married.  Does not have any children.  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   He drinks approximately 3-4 glasses of wine per week.  No history of excessive alcohol use.  Occasionally smokes a cigar.   Spends winters in [place]         Past Surgical History:  Procedure Laterality Date  . BIOPSY  10/01/2011   Procedure: BIOPSY;  Surgeon: [name], MD;  [facility];  Service: Urology;;  biopsy of bladder tumor  . BLADDER SUSPENSION  08/18/10   [name]  . CARDIAC CATHETERIZATION N/A 07/29/2015  Procedure: Right/Left Heart Cath and Coronary Angiography;  Surgeon: Peter M Martinique, MD;  Location: [facility];  Service: Cardiovascular;  Laterality: N/A;  . CATARACT EXTRACTION W/ INTRAOCULAR LENS  IMPLANT, BILATERAL Bilateral   . CYSTOSCOPY W/ RETROGRADES  01/27/2012   Procedure: CYSTOSCOPY WITH RETROGRADE PYELOGRAM;  Surgeon: [name], MD;  [facility];  Service: Urology;  Laterality: Left;  . CYSTOSCOPY WITH RETROGRADE PYELOGRAM, URETEROSCOPY AND STENT PLACEMENT Left 11/24/2012   Procedure: CYSTOSCOPY WITH left RETROGRADE PYELOGRAM, bladder washings, ureteroscopy;  Surgeon: [name], MD;   [facility];  Service: Urology;  Laterality: Left;  . CYSTOSCOPY WITH RETROGRADE PYELOGRAM, URETEROSCOPY AND STENT PLACEMENT Bilateral 11/19/2013   Procedure: CYSTOSCOPY WITH BILATERAL RETROGRADE PYELOGRAM,;  Surgeon: [name], MD;  [facility];  Service: Urology;  Laterality: Bilateral;  . EYE SURGERY  09/24/10   left eye. "Retina surgery"  . INGUINAL HERNIA REPAIR  1960  . PROSTATE SURGERY     s/p laparoscopic prostate surgery  . SKIN CANCER EXCISION     a/p skin cancer right foot-non melanoma  . TEE WITHOUT CARDIOVERSION N/A 07/29/2015   Procedure: TRANSESOPHAGEAL ECHOCARDIOGRAM (TEE);  Surgeon: Pixie Casino, MD;  Location: [facility] ENDOSCOPY;  Service: Cardiovascular;  Laterality: N/A;  cath to follow  . TRANSURETHRAL RESECTION OF BLADDER TUMOR  10/01/2011   Procedure: TRANSURETHRAL RESECTION OF BLADDER TUMOR (TURBT);  Surgeon: [name], MD;  [facility];  Service: Urology;  Laterality: N/A;  . TRANSURETHRAL RESECTION OF BLADDER TUMOR N/A 11/19/2013   Procedure: TRANSURETHRAL RESECTION OF BLADDER TUMOR (TURBT);  Surgeon: [name], MD;  [facility];  Service: Urology;  Laterality: N/A;  . URETEROSCOPY  01/27/2012   Procedure: URETEROSCOPY;  Surgeon: [name], MD;  [facility];  Service: Urology;  Laterality: Left;  CYSTO, Left RETROGRADE PYELOGRPHY, LEFT URETEROSCOPY   . URINARY SPHINCTER IMPLANT    . VARICOSE VEIN SURGERY      Family History  Problem Relation Age of Onset  . Colon cancer Neg Hx   . Colon polyps Neg Hx     No Known Allergies  Current Outpatient Medications on File Prior to Visit  Medication Sig Dispense Refill  . amLODipine (NORVASC) 2.5 MG tablet Take 1 tablet (2.5 mg total) by mouth daily. 151 tablet 0  . apixaban (ELIQUIS) 2.5 MG TABS tablet Take 1 tablet (2.5 mg total) by mouth 2 (two) times daily. 302 tablet 0  . B Complex-C (B-COMPLEX WITH VITAMIN C) tablet Take 1 tablet by mouth daily. Reported on 07/24/2015 90 tablet 3  .  cholecalciferol (VITAMIN D) 1000 units tablet Take 1 tablet (1,000 Units total) by mouth daily. 90 tablet 3  . furosemide (LASIX) 40 MG tablet Take 1 tablet (40 mg total) by mouth daily. 151 tablet 0  . KLOR-CON M10 10 MEQ tablet Take 1 tablet (10 mEq total) by mouth daily. 151 tablet 0  . methylcellulose (ARTIFICIAL TEARS) 1 % ophthalmic solution Place 1 drop into both eyes 2 (two) times daily.     . metoprolol succinate (TOPROL-XL) 25 MG 24 hr tablet Take 1 tablet (25 mg total) by mouth at bedtime. 151 tablet 0  . mirabegron ER (MYRBETRIQ) 50 MG TB24 tablet Take 1 tablet (50 mg total) by mouth daily. 151 tablet 0  . Multiple Vitamins-Minerals (PRESERVISION/LUTEIN PO) Take 1 tablet by mouth daily.    . pravastatin (PRAVACHOL) 20 MG tablet Take 1 tablet (20 mg total) by mouth at bedtime. 151 tablet 0  No current facility-administered medications on file prior to visit.     BP 140/82 (BP Location: Left Arm)   Temp 98.4 F (36.9 C) (Oral)   Wt 127 lb (57.6 kg)   BMI 18.22 kg/m       Objective:   Physical Exam  Constitutional: He is oriented to person, place, and time. He appears well-developed and well-nourished. No distress.  Eyes: Conjunctivae and EOM are normal. Pupils are equal, round, and reactive to light.  Cardiovascular: Normal rate, regular Jeremy and intact distal pulses. Exam reveals no gallop and no friction rub.  No murmur heard. Pulmonary/Chest: Effort normal and breath sounds normal. No respiratory distress. He has no wheezes. He has no rales. He exhibits no tenderness.  Musculoskeletal: He exhibits deformity (scoliosis). He exhibits no edema or tenderness.  Slow steady gait   Neurological: He is alert and oriented to person, place, and time.  Skin: Skin is warm and dry. No rash noted. He is not diaphoretic. No erythema. No pallor.  Psychiatric: He has a normal mood and affect. His behavior is normal. Judgment and thought content normal.  Nursing note and vitals  reviewed.     Assessment & Plan:  1. Other chronic back pain -Reviewed options for treatment of his back pain.  These treatments included orthopedics, steroid injections, surgical consult.  He wishes to do any type of treatments at this point in time.  He is relieved to know where the pain is coming from and he will start exercising again. Follow up as needed  [name], NP

## 2017-05-11 ENCOUNTER — Ambulatory Visit (INDEPENDENT_AMBULATORY_CARE_PROVIDER_SITE_OTHER): Payer: Medicare Other | Admitting: Adult Health

## 2017-05-11 ENCOUNTER — Encounter: Payer: Self-pay | Admitting: Adult Health

## 2017-05-11 VITALS — BP 140/70 | HR 62 | Temp 98.0°F | Ht 70.0 in | Wt 126.3 lb

## 2017-05-11 DIAGNOSIS — R1030 Lower abdominal pain, unspecified: Secondary | ICD-10-CM | POA: Diagnosis not present

## 2017-05-11 NOTE — Progress Notes (Signed)
Subjective:    Patient ID: Jeremy Kelley, male    DOB: 1926-06-06, 84 y.o.   MRN: [id number]  Abdominal Pain  This is a new problem. The current episode started today. The onset quality is sudden. The problem occurs constantly. The problem has been waxing and waning. The pain is located in the LLQ, RLQ and suprapubic region. The quality of the pain is sharp. The abdominal pain does not radiate. Associated symptoms include arthralgias. Pertinent negatives include no anorexia, constipation, diarrhea, fever, flatus, hematuria, nausea or vomiting. The pain is aggravated by certain positions and palpation. The pain is relieved by certain positions. Prior diagnostic workup includes CT scan. His past medical history is significant for abdominal surgery. There is no history of gallstones.   Was recently seen by myself as well as general surgery for complaint of right inguinal hernia.  Surgery does report a small hernia but that this does not need to be surgically repaired.  [name] reports that he woke up this morning and had sharp pain left groin.  Per patient "the pain was so bad that I almost called 911".  He reports laying back down and drinking a Coke which helped decrease the amount of pain he was.  Later on today pain presented in the right groin, and he continues to have some discomfort in bilateral groins at this point in time.  He denies a bowel movement today but this is not uncommon with him.  Is passing gas, denies any nausea vomiting.  He does not feel acutely ill, and has not had a fever.   Review of Systems  Constitutional: Negative for fever.  Respiratory: Negative.   Cardiovascular: Negative.   Gastrointestinal: Positive for abdominal pain. Negative for anorexia, constipation, diarrhea, flatus, nausea and vomiting.  Genitourinary: Negative.  Negative for hematuria.  Musculoskeletal: Positive for arthralgias and back pain.   Past Medical History:  Diagnosis Date  . Arthritis   .  Carotid artery stenosis 11/2008   bilateral 40-59% stenosis  . Colon polyps   . Diverticulosis   . Hepatitis    doesn't know which type 1969  . Hyperlipidemia   . Hypertension   . Numbness    fingertips  . Phimosis   . Prostate cancer ([place]) 2001  . Urothelial carcinoma of left distal ureter [facility])     Social History   Socioeconomic History  . Marital status: Single    Spouse name: Not on file  . Number of children: 0  . Years of education: Not on file  . Highest education level: Not on file  Social Needs  . Financial resource strain: Not on file  . Food insecurity - worry: Not on file  . Food insecurity - inability: Not on file  . Transportation needs - medical: Not on file  . Transportation needs - non-medical: Not on file  Occupational History  . Occupation: Retired  Tobacco Use  . Smoking status: Former Research scientist (life sciences)  . Smokeless tobacco: Never Used  . Tobacco comment: 07/29/2015 "might smoke a cigar once/year"  Substance and Sexual Activity  . Alcohol use: Yes    Alcohol/week: 8.4 oz    Types: 14 Glasses of wine per week    Comment: 07/29/2015 "big glass of wine q night"  . Drug use: No  . Sexual activity: Not on file  Other Topics Concern  . Not on file  Social History Narrative   Patient is single, has never been married.  Does not have any children.  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   He drinks approximately 3-4 glasses of wine per week.  No history of excessive alcohol use.  Occasionally smokes a cigar.   Spends winters in [place]         Past Surgical History:  Procedure Laterality Date  . BIOPSY  10/01/2011   Procedure: BIOPSY;  Surgeon: [name], MD;  [facility];  Service: Urology;;  biopsy of bladder tumor  . BLADDER SUSPENSION  08/18/10   [name]  . CARDIAC CATHETERIZATION N/A 07/29/2015   Procedure: Right/Left Heart Cath and Coronary Angiography;  Surgeon: Peter M Martinique, MD;  Location: [place] [facility];  Service:  Cardiovascular;  Laterality: N/A;  . CATARACT EXTRACTION W/ INTRAOCULAR LENS  IMPLANT, BILATERAL Bilateral   . CYSTOSCOPY W/ RETROGRADES  01/27/2012   Procedure: CYSTOSCOPY WITH RETROGRADE PYELOGRAM;  Surgeon: [name], MD;  [facility];  Service: Urology;  Laterality: Left;  . CYSTOSCOPY WITH RETROGRADE PYELOGRAM, URETEROSCOPY AND STENT PLACEMENT Left 11/24/2012   Procedure: CYSTOSCOPY WITH left RETROGRADE PYELOGRAM, bladder washings, ureteroscopy;  Surgeon: [name], MD;  [facility];  Service: Urology;  Laterality: Left;  . CYSTOSCOPY WITH RETROGRADE PYELOGRAM, URETEROSCOPY AND STENT PLACEMENT Bilateral 11/19/2013   Procedure: CYSTOSCOPY WITH BILATERAL RETROGRADE PYELOGRAM,;  Surgeon: [name], MD;  [facility];  Service: Urology;  Laterality: Bilateral;  . EYE SURGERY  09/24/10   left eye. "Retina surgery"  . INGUINAL HERNIA REPAIR  1960  . PROSTATE SURGERY     s/p laparoscopic prostate surgery  . SKIN CANCER EXCISION     a/p skin cancer right foot-non melanoma  . TEE WITHOUT CARDIOVERSION N/A 07/29/2015   Procedure: TRANSESOPHAGEAL ECHOCARDIOGRAM (TEE);  Surgeon: Pixie Casino, MD;  Location: [facility] ENDOSCOPY;  Service: Cardiovascular;  Laterality: N/A;  cath to follow  . TRANSURETHRAL RESECTION OF BLADDER TUMOR  10/01/2011   Procedure: TRANSURETHRAL RESECTION OF BLADDER TUMOR (TURBT);  Surgeon: [name], MD;  [facility];  Service: Urology;  Laterality: N/A;  . TRANSURETHRAL RESECTION OF BLADDER TUMOR N/A 11/19/2013   Procedure: TRANSURETHRAL RESECTION OF BLADDER TUMOR (TURBT);  Surgeon: [name], MD;  [facility];  Service: Urology;  Laterality: N/A;  . URETEROSCOPY  01/27/2012   Procedure: URETEROSCOPY;  Surgeon: [name], MD;  [facility];  Service: Urology;  Laterality: Left;  CYSTO, Left RETROGRADE PYELOGRPHY, LEFT URETEROSCOPY   . URINARY SPHINCTER IMPLANT    . VARICOSE VEIN SURGERY      Family History  Problem Relation Age of Onset  .  Colon cancer Neg Hx   . Colon polyps Neg Hx     No Known Allergies  Current Outpatient Medications on File Prior to Visit  Medication Sig Dispense Refill  . amLODipine (NORVASC) 2.5 MG tablet Take 1 tablet (2.5 mg total) by mouth daily. 151 tablet 0  . apixaban (ELIQUIS) 2.5 MG TABS tablet Take 1 tablet (2.5 mg total) by mouth 2 (two) times daily. 302 tablet 0  . B Complex-C (B-COMPLEX WITH VITAMIN C) tablet Take 1 tablet by mouth daily. Reported on 07/24/2015 90 tablet 3  . cholecalciferol (VITAMIN D) 1000 units tablet Take 1 tablet (1,000 Units total) by mouth daily. 90 tablet 3  . furosemide (LASIX) 40 MG tablet Take 1 tablet (40 mg total) by mouth daily. 151 tablet 0  . KLOR-CON M10 10 MEQ tablet Take 1 tablet (10 mEq total) by mouth daily. 151 tablet 0  . methylcellulose (ARTIFICIAL TEARS)  1 % ophthalmic solution Place 1 drop into both eyes 2 (two) times daily.     . metoprolol succinate (TOPROL-XL) 25 MG 24 hr tablet Take 1 tablet (25 mg total) by mouth at bedtime. 151 tablet 0  . mirabegron ER (MYRBETRIQ) 50 MG TB24 tablet Take 1 tablet (50 mg total) by mouth daily. 151 tablet 0  . Multiple Vitamins-Minerals (PRESERVISION/LUTEIN PO) Take 1 tablet by mouth daily.    . pravastatin (PRAVACHOL) 20 MG tablet Take 1 tablet (20 mg total) by mouth at bedtime. 151 tablet 0   No current facility-administered medications on file prior to visit.     BP 140/70 (BP Location: Left Arm, Patient Position: Sitting, Cuff Size: Normal)   Pulse 62   Temp 98 F (36.7 C) (Oral)   Ht 5\' 10"  (1.778 m)   Wt 126 lb 4.8 oz (57.3 kg)   SpO2 99%   BMI 18.12 kg/m       Objective:   Physical Exam  Constitutional: He is oriented to person, place, and time. He appears well-developed and well-nourished. No distress.  Cardiovascular: Normal rate, normal heart sounds and intact distal pulses. An irregularly irregular rhythm present. Exam reveals no gallop and no friction rub.  No murmur  heard. Pulmonary/Chest: Effort normal and breath sounds normal. No respiratory distress. He has no wheezes. He has no rales. He exhibits no tenderness.  Abdominal: Soft. Normal appearance and bowel sounds are normal. He exhibits no distension and no mass. There is no hepatosplenomegaly, splenomegaly or hepatomegaly. There is tenderness. There is no rebound and no guarding. No hernia.  Neurological: He is alert and oriented to person, place, and time.  Skin: Skin is warm and dry. No rash noted. He is not diaphoretic. No erythema. No pallor.  Psychiatric: He has a normal mood and affect. His behavior is normal. Thought content normal.  Nursing note and vitals reviewed.     Assessment & Plan:  1. Lower abdominal pain -No hernia felt on either side.  Did have pain with palpation to bilateral inguinal area.  No masses or lumps felt there was no bruising of the skin noted.  Bowel sounds were equal in all 4 quadrants.  Will check basic labs and get CT abdomen and pelvis for further evaluation due to symptoms. - CBC with Differential/Platelet - CT Abdomen Pelvis W Contrast; Future - Basic metabolic panel -Advised if pain presents as it did this morning or becomes worse than proceed to the emergency room for further evaluation  [name], NP

## 2017-05-12 ENCOUNTER — Telehealth: Payer: Self-pay | Admitting: Adult Health

## 2017-05-12 ENCOUNTER — Ambulatory Visit: Payer: Medicare Other | Admitting: Adult Health

## 2017-05-12 LAB — CBC WITH DIFFERENTIAL/PLATELET
BASOS PCT: 1.4 % (ref 0.0–3.0)
Basophils Absolute: 0.1 10*3/uL (ref 0.0–0.1)
EOS ABS: 0.3 10*3/uL (ref 0.0–0.7)
Eosinophils Relative: 3.4 % (ref 0.0–5.0)
HEMATOCRIT: 42.8 % (ref 39.0–52.0)
Hemoglobin: 14.5 g/dL (ref 13.0–17.0)
LYMPHS PCT: 26 % (ref 12.0–46.0)
Lymphs Abs: 2.2 10*3/uL (ref 0.7–4.0)
MCHC: 33.8 g/dL (ref 30.0–36.0)
MCV: 95.4 fl (ref 78.0–100.0)
Monocytes Absolute: 0.5 10*3/uL (ref 0.1–1.0)
Monocytes Relative: 6.3 % (ref 3.0–12.0)
Neutro Abs: 5.4 10*3/uL (ref 1.4–7.7)
Neutrophils Relative %: 62.9 % (ref 43.0–77.0)
Platelets: 207 10*3/uL (ref 150.0–400.0)
RBC: 4.49 Mil/uL (ref 4.22–5.81)
RDW: 14.2 % (ref 11.5–15.5)
WBC: 8.6 10*3/uL (ref 4.0–10.5)

## 2017-05-12 LAB — BASIC METABOLIC PANEL
BUN: 21 mg/dL (ref 6–23)
CHLORIDE: 99 meq/L (ref 96–112)
CO2: 34 mEq/L — ABNORMAL HIGH (ref 19–32)
Calcium: 9.8 mg/dL (ref 8.4–10.5)
Creatinine, Ser: 1.22 mg/dL (ref 0.40–1.50)
GFR: 59.54 mL/min — ABNORMAL LOW (ref >60.00)
Glucose, Bld: 100 mg/dL — ABNORMAL HIGH (ref 70–99)
POTASSIUM: 4.2 meq/L (ref 3.5–5.1)
Sodium: 141 mEq/L (ref 135–145)

## 2017-05-12 NOTE — Telephone Encounter (Signed)
Copied from [place]. Topic: General - Other >> May 12, 2017  3:06 PM Jeremy Kelley, NT wrote: Patient is calling stating he was seen yesterday 05/11/17 and was supposed to receive a call today about setting up a echogram. Patient has not received the phone call and is checking on the status of this. Please advise.

## 2017-05-17 ENCOUNTER — Ambulatory Visit (INDEPENDENT_AMBULATORY_CARE_PROVIDER_SITE_OTHER): Payer: Medicare Other | Admitting: Cardiovascular Disease

## 2017-05-17 ENCOUNTER — Encounter: Payer: Self-pay | Admitting: Cardiovascular Disease

## 2017-05-17 ENCOUNTER — Telehealth: Payer: Self-pay | Admitting: Cardiovascular Disease

## 2017-05-17 ENCOUNTER — Ambulatory Visit (INDEPENDENT_AMBULATORY_CARE_PROVIDER_SITE_OTHER)
Admission: RE | Admit: 2017-05-17 | Discharge: 2017-05-17 | Disposition: A | Discharge status: Home / Self Care | Payer: Medicare Other | Source: Ambulatory Visit | Attending: Adult Health | Admitting: Adult Health

## 2017-05-17 VITALS — BP 136/64 | HR 80 | Ht 70.0 in | Wt 125.5 lb

## 2017-05-17 DIAGNOSIS — I272 Pulmonary hypertension, unspecified: Secondary | ICD-10-CM

## 2017-05-17 DIAGNOSIS — I351 Nonrheumatic aortic (valve) insufficiency: Secondary | ICD-10-CM

## 2017-05-17 DIAGNOSIS — R1031 Right lower quadrant pain: Secondary | ICD-10-CM | POA: Diagnosis not present

## 2017-05-17 DIAGNOSIS — R1032 Left lower quadrant pain: Secondary | ICD-10-CM | POA: Diagnosis not present

## 2017-05-17 DIAGNOSIS — R1030 Lower abdominal pain, unspecified: Secondary | ICD-10-CM

## 2017-05-17 MED ORDER — IOPAMIDOL (ISOVUE-300) INJECTION 61%
100.0000 mL | Freq: Once | INTRAVENOUS | Status: AC | PRN
  Administered 2017-05-17: 100 mL via INTRAVENOUS

## 2017-05-17 NOTE — Telephone Encounter (Signed)
Walk in pt Form-papers dropped off. Placed in [place]
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
## 2017-05-17 NOTE — Patient Instructions (Addendum)
Medication Instructions:  Your physician recommends that you continue on your current medications as directed. Please refer to the Current Medication list given to you today. ** Call Sharyn Lull to report if increased dose of Lasix is helpful and more medication is needed  Labwork: None Ordered   Testing/Procedures: None Ordered   Follow-Up: Your physician recommends that you schedule a follow-up appointment in: 3 months with Dr. Acie Fredrickson   If you need a refill on your cardiac medications before your next appointment, please call your pharmacy.   Thank you for choosing [facility]! Christen Bame, RN [phone]

## 2017-05-18 ENCOUNTER — Ambulatory Visit (INDEPENDENT_AMBULATORY_CARE_PROVIDER_SITE_OTHER): Payer: Medicare Other | Admitting: Adult Health

## 2017-05-18 ENCOUNTER — Encounter: Payer: Self-pay | Admitting: Adult Health

## 2017-05-18 VITALS — BP 130/60 | Temp 98.2°F | Wt 125.0 lb

## 2017-05-18 DIAGNOSIS — K5900 Constipation, unspecified: Secondary | ICD-10-CM | POA: Diagnosis not present

## 2017-05-18 DIAGNOSIS — R1031 Right lower quadrant pain: Secondary | ICD-10-CM | POA: Diagnosis not present

## 2017-05-18 NOTE — Progress Notes (Signed)
Subjective:    Patient ID: Jeremy Kelley, male    DOB: 05-Jan-1927, 84 y.o.   MRN: [id number]  HPI  84 year old male who  has a past medical history of Arthritis, Carotid artery stenosis (11/2008), Colon polyps, Diverticulosis, Hepatitis, Hyperlipidemia, Hypertension, Numbness, Phimosis, Prostate cancer ([place]) (2001), and Urothelial carcinoma of left distal ureter ([place]).  He presents to the office today to discuss CT results due to lower abdominal pain.   CT results show   IMPRESSION: 1. Moderate stool throughout the colon with dilation of the level of the rectum up to 6.7 cm. There is no evidence for proximal obstruction. 2. The bowel is otherwise unremarkable. 3.  Aortic Atherosclerosis (ICD10-I70.0). 4. Diffuse bladder wall thickening without a discrete mass. This is a chronic finding. It likely relates to chronic inflammation or cystitis.  We discussed his results in detail.  He continues to have intermittent episodes of right inguinal pain.  I tried to explain to him that being constipated could cause this pain if it was pushing on the small hernia that was noted during his surgical consult.  Despite this he would like to have a second opinion when it comes to surgical repair of small right inguinal hernia   Review of Systems See HPI   Past Medical History:  Diagnosis Date  . Arthritis   . Carotid artery stenosis 11/2008   bilateral 40-59% stenosis  . Colon polyps   . Diverticulosis   . Hepatitis    doesn't know which type 1969  . Hyperlipidemia   . Hypertension   . Numbness    fingertips  . Phimosis   . Prostate cancer ([place]) 2001  . Urothelial carcinoma of left distal ureter [facility])     Social History   Socioeconomic History  . Marital status: Single    Spouse name: Not on file  . Number of children: 0  . Years of education: Not on file  . Highest education level: Not on file  Social Needs  . Financial resource strain: Not on file  . Food insecurity -  worry: Not on file  . Food insecurity - inability: Not on file  . Transportation needs - medical: Not on file  . Transportation needs - non-medical: Not on file  Occupational History  . Occupation: Retired  Tobacco Use  . Smoking status: Former Research scientist (life sciences)  . Smokeless tobacco: Never Used  . Tobacco comment: 07/29/2015 "might smoke a cigar once/year"  Substance and Sexual Activity  . Alcohol use: Yes    Alcohol/week: 8.4 oz    Types: 14 Glasses of wine per week    Comment: 07/29/2015 "big glass of wine q night"  . Drug use: No  . Sexual activity: Not on file  Other Topics Concern  . Not on file  Social History Narrative   Patient is single, has never been married.  Does not have any children.  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   He drinks approximately 3-4 glasses of wine per week.  No history of excessive alcohol use.  Occasionally smokes a cigar.   Spends winters in [place]         Past Surgical History:  Procedure Laterality Date  . BIOPSY  10/01/2011   Procedure: BIOPSY;  Surgeon: [name], MD;  [facility]: [facility];  Service: Urology;;  biopsy of bladder tumor  . BLADDER SUSPENSION  08/18/10   [name]  . CARDIAC CATHETERIZATION N/A 07/29/2015   Procedure: Right/Left Heart  Cath and Coronary Angiography;  Surgeon: Peter M Martinique, MD;  Location: [place] [facility];  Service: Cardiovascular;  Laterality: N/A;  . CATARACT EXTRACTION W/ INTRAOCULAR LENS  IMPLANT, BILATERAL Bilateral   . CYSTOSCOPY W/ RETROGRADES  01/27/2012   Procedure: CYSTOSCOPY WITH RETROGRADE PYELOGRAM;  Surgeon: [name], MD;  [facility];  Service: Urology;  Laterality: Left;  . CYSTOSCOPY WITH RETROGRADE PYELOGRAM, URETEROSCOPY AND STENT PLACEMENT Left 11/24/2012   Procedure: CYSTOSCOPY WITH left RETROGRADE PYELOGRAM, bladder washings, ureteroscopy;  Surgeon: [name], MD;  [facility];  Service: Urology;  Laterality: Left;  . CYSTOSCOPY WITH RETROGRADE PYELOGRAM,  URETEROSCOPY AND STENT PLACEMENT Bilateral 11/19/2013   Procedure: CYSTOSCOPY WITH BILATERAL RETROGRADE PYELOGRAM,;  Surgeon: [name], MD;  [facility];  Service: Urology;  Laterality: Bilateral;  . EYE SURGERY  09/24/10   left eye. "Retina surgery"  . INGUINAL HERNIA REPAIR  1960  . PROSTATE SURGERY     s/p laparoscopic prostate surgery  . SKIN CANCER EXCISION     a/p skin cancer right foot-non melanoma  . TEE WITHOUT CARDIOVERSION N/A 07/29/2015   Procedure: TRANSESOPHAGEAL ECHOCARDIOGRAM (TEE);  Surgeon: Pixie Casino, MD;  Location: [facility] ENDOSCOPY;  Service: Cardiovascular;  Laterality: N/A;  cath to follow  . TRANSURETHRAL RESECTION OF BLADDER TUMOR  10/01/2011   Procedure: TRANSURETHRAL RESECTION OF BLADDER TUMOR (TURBT);  Surgeon: [name], MD;  [facility];  Service: Urology;  Laterality: N/A;  . TRANSURETHRAL RESECTION OF BLADDER TUMOR N/A 11/19/2013   Procedure: TRANSURETHRAL RESECTION OF BLADDER TUMOR (TURBT);  Surgeon: [name], MD;  [facility];  Service: Urology;  Laterality: N/A;  . URETEROSCOPY  01/27/2012   Procedure: URETEROSCOPY;  Surgeon: [name], MD;  [facility];  Service: Urology;  Laterality: Left;  CYSTO, Left RETROGRADE PYELOGRPHY, LEFT URETEROSCOPY   . URINARY SPHINCTER IMPLANT    . VARICOSE VEIN SURGERY      Family History  Problem Relation Age of Onset  . Colon cancer Neg Hx   . Colon polyps Neg Hx     No Known Allergies  Current Outpatient Medications on File Prior to Visit  Medication Sig Dispense Refill  . amLODipine (NORVASC) 2.5 MG tablet Take 1 tablet (2.5 mg total) by mouth daily. 151 tablet 0  . apixaban (ELIQUIS) 2.5 MG TABS tablet Take 1 tablet (2.5 mg total) by mouth 2 (two) times daily. 302 tablet 0  . B Complex-C (B-COMPLEX WITH VITAMIN C) tablet Take 1 tablet by mouth daily. Reported on 07/24/2015 90 tablet 3  . cholecalciferol (VITAMIN D) 1000 units tablet Take 1 tablet (1,000 Units total) by mouth daily. 90  tablet 3  . furosemide (LASIX) 40 MG tablet Take 1 tablet (40 mg total) by mouth daily. 151 tablet 0  . KLOR-CON M10 10 MEQ tablet Take 1 tablet (10 mEq total) by mouth daily. 151 tablet 0  . methylcellulose (ARTIFICIAL TEARS) 1 % ophthalmic solution Place 1 drop into both eyes 2 (two) times daily.     . metoprolol succinate (TOPROL-XL) 25 MG 24 hr tablet Take 1 tablet (25 mg total) by mouth at bedtime. 151 tablet 0  . mirabegron ER (MYRBETRIQ) 50 MG TB24 tablet Take 1 tablet (50 mg total) by mouth daily. 151 tablet 0  . Multiple Vitamins-Minerals (PRESERVISION/LUTEIN PO) Take 1 tablet by mouth daily.    . pravastatin (PRAVACHOL) 20 MG tablet Take 1 tablet (20 mg total) by mouth at bedtime. 151 tablet 0   No current facility-administered  medications on file prior to visit.     BP 130/60   Temp 98.2 F (36.8 C) (Oral)   Wt 125 lb (56.7 kg)   BMI 17.94 kg/m       Objective:   Physical Exam  Constitutional: He is oriented to person, place, and time. He appears well-developed and well-nourished. No distress.  Cardiovascular: Normal rate, regular rhythm, normal heart sounds and intact distal pulses. Exam reveals no gallop and no friction rub.  No murmur heard. Pulmonary/Chest: Effort normal and breath sounds normal. No respiratory distress. He has no wheezes. He has no rales. He exhibits no tenderness.  Neurological: He is alert and oriented to person, place, and time.  Skin: Skin is warm and dry. No rash noted. He is not diaphoretic. No erythema. No pallor.  Psychiatric: He has a normal mood and affect. His behavior is normal. Judgment and thought content normal.  Nursing note and vitals reviewed.     Assessment & Plan:  1. Right lower quadrant pain  - Ambulatory referral to General Surgery  2. Constipation, unspecified constipation type  Stool softener or fiber supplement as needed - Follow up as needed  [name], NP

## 2017-05-19 DIAGNOSIS — D485 Neoplasm of uncertain behavior of skin: Secondary | ICD-10-CM | POA: Diagnosis not present

## 2017-05-19 DIAGNOSIS — Z23 Encounter for immunization: Secondary | ICD-10-CM | POA: Diagnosis not present

## 2017-05-19 DIAGNOSIS — L821 Other seborrheic keratosis: Secondary | ICD-10-CM | POA: Diagnosis not present

## 2017-05-19 DIAGNOSIS — L570 Actinic keratosis: Secondary | ICD-10-CM | POA: Diagnosis not present

## 2017-05-19 DIAGNOSIS — C44319 Basal cell carcinoma of skin of other parts of face: Secondary | ICD-10-CM | POA: Diagnosis not present

## 2017-05-19 DIAGNOSIS — S81801D Unspecified open wound, right lower leg, subsequent encounter: Secondary | ICD-10-CM | POA: Diagnosis not present

## 2017-05-20 HISTORY — PX: SKIN BIOPSY: SHX1

## 2017-06-01 ENCOUNTER — Encounter: Payer: Self-pay | Admitting: Family Medicine

## 2017-06-01 DIAGNOSIS — H52203 Unspecified astigmatism, bilateral: Secondary | ICD-10-CM | POA: Diagnosis not present

## 2017-06-01 DIAGNOSIS — H04123 Dry eye syndrome of bilateral lacrimal glands: Secondary | ICD-10-CM | POA: Diagnosis not present

## 2017-06-01 DIAGNOSIS — H26492 Other secondary cataract, left eye: Secondary | ICD-10-CM | POA: Diagnosis not present

## 2017-06-01 DIAGNOSIS — H35373 Puckering of macula, bilateral: Secondary | ICD-10-CM | POA: Diagnosis not present

## 2017-06-02 DIAGNOSIS — K4090 Unilateral inguinal hernia, without obstruction or gangrene, not specified as recurrent: Secondary | ICD-10-CM | POA: Diagnosis not present

## 2017-06-03 ENCOUNTER — Ambulatory Visit (INDEPENDENT_AMBULATORY_CARE_PROVIDER_SITE_OTHER): Payer: Medicare Other | Admitting: Adult Health

## 2017-06-03 ENCOUNTER — Encounter: Payer: Self-pay | Admitting: Adult Health

## 2017-06-03 VITALS — BP 122/52 | Temp 97.7°F | Wt 128.0 lb

## 2017-06-03 DIAGNOSIS — Z760 Encounter for issue of repeat prescription: Secondary | ICD-10-CM

## 2017-06-03 DIAGNOSIS — S51819A Laceration without foreign body of unspecified forearm, initial encounter: Secondary | ICD-10-CM | POA: Diagnosis not present

## 2017-06-03 DIAGNOSIS — E785 Hyperlipidemia, unspecified: Secondary | ICD-10-CM | POA: Diagnosis not present

## 2017-06-03 DIAGNOSIS — I482 Chronic atrial fibrillation, unspecified: Secondary | ICD-10-CM

## 2017-06-03 DIAGNOSIS — R600 Localized edema: Secondary | ICD-10-CM

## 2017-06-03 DIAGNOSIS — I10 Essential (primary) hypertension: Secondary | ICD-10-CM

## 2017-06-03 MED ORDER — DOXYCYCLINE HYCLATE 100 MG PO CAPS: 100.0000 mg | ORAL_CAPSULE | Freq: Two times a day (BID) | ORAL | 0 refills | Status: DC

## 2017-06-03 MED ORDER — PRAVASTATIN SODIUM 20 MG PO TABS: 20.0000 mg | ORAL_TABLET | Freq: Every day | ORAL | 1 refills | Status: DC

## 2017-06-03 MED ORDER — MIRABEGRON ER 50 MG PO TB24: 50.0000 mg | ORAL_TABLET | Freq: Every day | ORAL | 1 refills | Status: DC

## 2017-06-03 MED ORDER — FUROSEMIDE 40 MG PO TABS: 40.0000 mg | ORAL_TABLET | Freq: Every day | ORAL | 1 refills | Status: DC

## 2017-06-03 MED ORDER — KLOR-CON M10 10 MEQ PO TBCR: 10.0000 meq | EXTENDED_RELEASE_TABLET | Freq: Every day | ORAL | 1 refills | Status: DC

## 2017-06-03 MED ORDER — METOPROLOL SUCCINATE ER 25 MG PO TB24
25.0000 mg | ORAL_TABLET | Freq: Every day | ORAL | 1 refills | Status: DC
Start: 2017-06-03 — End: 2017-12-08

## 2017-06-03 MED ORDER — AMLODIPINE BESYLATE 2.5 MG PO TABS: 2.5000 mg | ORAL_TABLET | Freq: Every day | ORAL | 0 refills | Status: DC

## 2017-06-03 NOTE — Progress Notes (Signed)
Subjective:    Patient ID: Jeremy Kelley, male    DOB: 1927/03/30, 84 y.o.   MRN: [id number]  HPI 85 year old male who  has a past medical history of Arthritis, Carotid artery stenosis (11/2008), Colon polyps, Diverticulosis, Hepatitis, Hyperlipidemia, Hypertension, Numbness, Phimosis, Prostate cancer ([place]) (2001), and Urothelial carcinoma of left distal ureter ([place]).  He presents to the office today for concern of infection to his left forearm. Reports hitting his arm on a table, causing a skin tear. Now reports redness, warmth and tenderness. No active bleeding or discharge.   He also will be leaving in one week for [place] and needs his medications refilled   Review of Systems See HPI   Past Medical History:  Diagnosis Date  . Arthritis   . Carotid artery stenosis 11/2008   bilateral 40-59% stenosis  . Colon polyps   . Diverticulosis   . Hepatitis    doesn't know which type 1969  . Hyperlipidemia   . Hypertension   . Numbness    fingertips  . Phimosis   . Prostate cancer ([place]) 2001  . Urothelial carcinoma of left distal ureter [facility])     Social History   Socioeconomic History  . Marital status: Single    Spouse name: Not on file  . Number of children: 0  . Years of education: Not on file  . Highest education level: Not on file  Social Needs  . Financial resource strain: Not on file  . Food insecurity - worry: Not on file  . Food insecurity - inability: Not on file  . Transportation needs - medical: Not on file  . Transportation needs - non-medical: Not on file  Occupational History  . Occupation: Retired  Tobacco Use  . Smoking status: Former Research scientist (life sciences)  . Smokeless tobacco: Never Used  . Tobacco comment: 07/29/2015 "might smoke a cigar once/year"  Substance and Sexual Activity  . Alcohol use: Yes    Alcohol/week: 8.4 oz    Types: 14 Glasses of wine per week    Comment: 07/29/2015 "big glass of wine q night"  . Drug use: No  . Sexual activity: Not on file    Other Topics Concern  . Not on file  Social History Narrative   Patient is single, has never been married.  Does not have any children.  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   He drinks approximately 3-4 glasses of wine per week.  No history of excessive alcohol use.  Occasionally smokes a cigar.   Spends winters in [place]         Past Surgical History:  Procedure Laterality Date  . BIOPSY  10/01/2011   Procedure: BIOPSY;  Surgeon: [name], MD;  [facility];  Service: Urology;;  biopsy of bladder tumor  . BLADDER SUSPENSION  08/18/10   [name]  . CARDIAC CATHETERIZATION N/A 07/29/2015   Procedure: Right/Left Heart Cath and Coronary Angiography;  Surgeon: Peter M Martinique, MD;  Location: [place] [facility];  Service: Cardiovascular;  Laterality: N/A;  . CATARACT EXTRACTION W/ INTRAOCULAR LENS  IMPLANT, BILATERAL Bilateral   . CYSTOSCOPY W/ RETROGRADES  01/27/2012   Procedure: CYSTOSCOPY WITH RETROGRADE PYELOGRAM;  Surgeon: [name], MD;  [facility];  Service: Urology;  Laterality: Left;  . CYSTOSCOPY WITH RETROGRADE PYELOGRAM, URETEROSCOPY AND STENT PLACEMENT Left 11/24/2012   Procedure: CYSTOSCOPY WITH left RETROGRADE PYELOGRAM, bladder washings, ureteroscopy;  Surgeon: [name], MD;  [facility];  Service:  Urology;  Laterality: Left;  . CYSTOSCOPY WITH RETROGRADE PYELOGRAM, URETEROSCOPY AND STENT PLACEMENT Bilateral 11/19/2013   Procedure: CYSTOSCOPY WITH BILATERAL RETROGRADE PYELOGRAM,;  Surgeon: [name], MD;  [facility];  Service: Urology;  Laterality: Bilateral;  . EYE SURGERY  09/24/10   left eye. "Retina surgery"  . INGUINAL HERNIA REPAIR  1960  . PROSTATE SURGERY     s/p laparoscopic prostate surgery  . SKIN BIOPSY  05/20/2017   Superficial and nodular basal cell carcinoma (left temple)    basal cell carcinoma with sclerosis (right temple)  . SKIN CANCER EXCISION     a/p skin cancer right foot-non melanoma  . TEE WITHOUT  CARDIOVERSION N/A 07/29/2015   Procedure: TRANSESOPHAGEAL ECHOCARDIOGRAM (TEE);  Surgeon: Pixie Casino, MD;  [facility] END[facility]COPY;  Service: Cardiovascular;  Laterality: N/A;  cath to follow  . TRANSURETHRAL RESECTION OF BLADDER TUMOR  10/01/2011   Procedure: TRANSURETHRAL RESECTION OF BLADDER TUMOR (TURBT);  Surgeon: [name], MD;  [facility];  Service: Urology;  Laterality: N/A;  . TRANSURETHRAL RESECTION OF BLADDER TUMOR N/A 11/19/2013   Procedure: TRANSURETHRAL RESECTION OF BLADDER TUMOR (TURBT);  Surgeon: [name], MD;  [facility];  Service: Urology;  Laterality: N/A;  . URETEROSCOPY  01/27/2012   Procedure: URETEROSCOPY;  Surgeon: [name], MD;  [facility];  Service: Urology;  Laterality: Left;  CYSTO, Left RETROGRADE PYELOGRPHY, LEFT URETEROSCOPY   . URINARY SPHINCTER IMPLANT    . VARICOSE VEIN SURGERY      Family History  Problem Relation Age of Onset  . Colon cancer Neg Hx   . Colon polyps Neg Hx     No Known Allergies  Current Outpatient Medications on File Prior to Visit  Medication Sig Dispense Refill  . amLODipine (NORVASC) 2.5 MG tablet Take 1 tablet (2.5 mg total) by mouth daily. 151 tablet 0  . apixaban (ELIQUIS) 2.5 MG TABS tablet Take 1 tablet (2.5 mg total) by mouth 2 (two) times daily. 302 tablet 0  . B Complex-C (B-COMPLEX WITH VITAMIN C) tablet Take 1 tablet by mouth daily. Reported on 07/24/2015 90 tablet 3  . cholecalciferol (VITAMIN D) 1000 units tablet Take 1 tablet (1,000 Units total) by mouth daily. 90 tablet 3  . furosemide (LASIX) 40 MG tablet Take 1 tablet (40 mg total) by mouth daily. 151 tablet 0  . KLOR-CON M10 10 MEQ tablet Take 1 tablet (10 mEq total) by mouth daily. 151 tablet 0  . methylcellulose (ARTIFICIAL TEARS) 1 % ophthalmic solution Place 1 drop into both eyes 2 (two) times daily.     . metoprolol succinate (TOPROL-XL) 25 MG 24 hr tablet Take 1 tablet (25 mg total) by mouth at bedtime. 151 tablet 0  . mirabegron ER  (MYRBETRIQ) 50 MG TB24 tablet Take 1 tablet (50 mg total) by mouth daily. 151 tablet 0  . Multiple Vitamins-Minerals (PRESERVISION/LUTEIN PO) Take 1 tablet by mouth daily.    . pravastatin (PRAVACHOL) 20 MG tablet Take 1 tablet (20 mg total) by mouth at bedtime. 151 tablet 0   No current facility-administered medications on file prior to visit.     BP (!) 122/52 (BP Location: Right Arm)   Temp 97.7 F (36.5 C) (Oral)   Wt 128 lb (58.1 kg)   BMI 18.37 kg/m       Objective:   Physical Exam  Constitutional: He is oriented to person, place, and time. He appears well-developed and well-nourished. No distress.  Cardiovascular: Normal rate and intact  distal pulses. An irregularly irregular rhythm present. Exam reveals no gallop.  Murmur heard. Pulmonary/Chest: Effort normal and breath sounds normal.  Neurological: He is alert and oriented to person, place, and time.  Skin: Skin is warm and dry. No rash noted. He is not diaphoretic. No erythema. No pallor.  Skin tear noted on left forearm. Redness, warmth, swelling and tenderness with palpation noted.   Psychiatric: He has a normal mood and affect. Judgment and thought content normal.  Nursing note and vitals reviewed.      Assessment & Plan:  1. Skin tear of forearm without complication, initial encounter - Localized infection  - doxycycline (VIBRAMYCIN) 100 MG capsule; Take 1 capsule (100 mg total) by mouth 2 (two) times daily.  Dispense: 14 capsule; Refill: 0 - Keep wound clean and dry, follow up if needed  2. Medication refill  - amLODipine (NORVASC) 2.5 MG tablet; Take 1 tablet (2.5 mg total) by mouth daily.  Dispense: 90 tablet; Refill: 0 - furosemide (LASIX) 40 MG tablet; Take 1 tablet (40 mg total) by mouth daily.  Dispense: 90 tablet; Refill: 1 - KLOR-CON M10 10 MEQ tablet; Take 1 tablet (10 mEq total) by mouth daily.  Dispense: 90 tablet; Refill: 1 - metoprolol succinate (TOPROL-XL) 25 MG 24 hr tablet; Take 1 tablet (25 mg  total) by mouth at bedtime.  Dispense: 90 tablet; Refill: 1 - mirabegron ER (MYRBETRIQ) 50 MG TB24 tablet; Take 1 tablet (50 mg total) by mouth daily.  Dispense: 90 tablet; Refill: 1 - pravastatin (PRAVACHOL) 20 MG tablet; Take 1 tablet (20 mg total) by mouth at bedtime.  Dispense: 90 tablet; Refill: 1  3. Essential hypertension  - amLODipine (NORVASC) 2.5 MG tablet; Take 1 tablet (2.5 mg total) by mouth daily.  Dispense: 90 tablet; Refill: 0 - furosemide (LASIX) 40 MG tablet; Take 1 tablet (40 mg total) by mouth daily.  Dispense: 90 tablet; Refill: 1 - metoprolol succinate (TOPROL-XL) 25 MG 24 hr tablet; Take 1 tablet (25 mg total) by mouth at bedtime.  Dispense: 90 tablet; Refill: 1  4. Lower extremity edema  - furosemide (LASIX) 40 MG tablet; Take 1 tablet (40 mg total) by mouth daily.  Dispense: 90 tablet; Refill: 1 - KLOR-CON M10 10 MEQ tablet; Take 1 tablet (10 mEq total) by mouth daily.  Dispense: 90 tablet; Refill: 1  5. Chronic atrial fibrillation (HCC)  - metoprolol succinate (TOPROL-XL) 25 MG 24 hr tablet; Take 1 tablet (25 mg total) by mouth at bedtime.  Dispense: 90 tablet; Refill: 1  6. Hyperlipidemia, unspecified hyperlipidemia type  - pravastatin (PRAVACHOL) 20 MG tablet; Take 1 tablet (20 mg total) by mouth at bedtime.  Dispense: 90 tablet; Refill: 1   [name], NP

## 2017-06-06 DIAGNOSIS — H04123 Dry eye syndrome of bilateral lacrimal glands: Secondary | ICD-10-CM | POA: Diagnosis not present

## 2017-06-08 DIAGNOSIS — Z85828 Personal history of other malignant neoplasm of skin: Secondary | ICD-10-CM | POA: Diagnosis not present

## 2017-06-08 DIAGNOSIS — R600 Localized edema: Secondary | ICD-10-CM | POA: Diagnosis not present

## 2017-06-08 DIAGNOSIS — L080 Pyoderma: Secondary | ICD-10-CM | POA: Diagnosis not present

## 2017-07-05 ENCOUNTER — Other Ambulatory Visit: Payer: Self-pay | Admitting: Adult Health

## 2017-07-05 DIAGNOSIS — R600 Localized edema: Secondary | ICD-10-CM

## 2017-07-05 DIAGNOSIS — Z760 Encounter for issue of repeat prescription: Secondary | ICD-10-CM

## 2017-07-06 NOTE — Telephone Encounter (Signed)
DENIED.  FILLED ON 06/03/2017 FOR 6 MONTHS.  REFILL REQUEST IS TOO EARLY.

## 2017-09-05 ENCOUNTER — Other Ambulatory Visit: Payer: Self-pay | Admitting: Adult Health

## 2017-09-05 DIAGNOSIS — I10 Essential (primary) hypertension: Secondary | ICD-10-CM

## 2017-09-05 DIAGNOSIS — Z760 Encounter for issue of repeat prescription: Secondary | ICD-10-CM

## 2017-09-06 NOTE — Telephone Encounter (Signed)
Sent to the pharmacy by e-scribe. 

## 2017-09-06 NOTE — Telephone Encounter (Signed)
Ok to refill for 90 +1 

## 2017-09-09 ENCOUNTER — Ambulatory Visit: Payer: Medicare Other | Admitting: Cardiovascular Disease

## 2017-09-14 ENCOUNTER — Ambulatory Visit (INDEPENDENT_AMBULATORY_CARE_PROVIDER_SITE_OTHER): Payer: Medicare Other | Admitting: Adult Health

## 2017-09-14 ENCOUNTER — Encounter: Payer: Self-pay | Admitting: Adult Health

## 2017-09-14 VITALS — BP 110/52 | Temp 97.5°F | Wt 129.0 lb

## 2017-09-14 DIAGNOSIS — I10 Essential (primary) hypertension: Secondary | ICD-10-CM | POA: Diagnosis not present

## 2017-09-14 DIAGNOSIS — R32 Unspecified urinary incontinence: Secondary | ICD-10-CM

## 2017-09-14 DIAGNOSIS — E7800 Pure hypercholesterolemia, unspecified: Secondary | ICD-10-CM

## 2017-09-14 MED ORDER — MIRABEGRON ER 50 MG PO TB24: 50.0000 mg | ORAL_TABLET | Freq: Every day | ORAL | 3 refills | Status: DC

## 2017-09-14 NOTE — Progress Notes (Signed)
Subjective:    Patient ID: Jeremy Kelley, male    DOB: [birth year], 84 y.o.   MRN: [id number]  HPI  84 year old male who  has a past medical history of Arthritis, Carotid artery stenosis (11/2008), Colon polyps, Diverticulosis, Hepatitis, Hyperlipidemia, Hypertension, Numbness, Phimosis, Prostate cancer ([place]) (2001), and Urothelial carcinoma of left distal ureter ([place]).  Presents to the office today for follow-up after returning from [place], where he spends the winters.  Reports that he had good vacation in [place] but was seen by his medical team down there and would like to bring some things to my attention.  1. Urinary Incontinence -Was changed from Myrbetriq to Coffee while in [place].  He reports that he is noticed since starting this medication that he feels "more foggy headed and has trouble concentrating.  "Not noticed any changes in urinary incontinence this after this change of medications.   2. Hyperlipidemia -advised to take his cholesterol medication every other day instead of daily.  Has been following this regimen  3. Essential Hypertension -advised to take amlodipine every other day instead of daily.  He has been following this regimen for the last month or so. BP Readings from Last 3 Encounters:  09/14/17 (!) 110/52  06/03/17 (!) 122/52  05/18/17 130/60     Overall he is feeling pretty well, but continues to have increasing fatigue and mid to lower back pain.  He did not have any falls while in [place], and had no complaints of chest pain or shortness of breath.   Review of Systems See HPI   Past Medical History:  Diagnosis Date  . Arthritis   . Carotid artery stenosis 11/2008   bilateral 40-59% stenosis  . Colon polyps   . Diverticulosis   . Hepatitis    doesn't know which type 1969  . Hyperlipidemia   . Hypertension   . Numbness    fingertips  . Phimosis   . Prostate cancer ([place]) 2001  . Urothelial carcinoma of left distal ureter [facility])      Social History   Socioeconomic History  . Marital status: Single    Spouse name: Not on file  . Number of children: 0  . Years of education: Not on file  . Highest education level: Not on file  Occupational History  . Occupation: Retired  Scientific laboratory technician  . Financial resource strain: Not on file  . Food insecurity:    Worry: Not on file    Inability: Not on file  . Transportation needs:    Medical: Not on file    Non-medical: Not on file  Tobacco Use  . Smoking status: Former Research scientist (life sciences)  . Smokeless tobacco: Never Used  . Tobacco comment: 07/29/2015 "might smoke a cigar once/year"  Substance and Sexual Activity  . Alcohol use: Yes    Alcohol/week: 8.4 oz    Types: 14 Glasses of wine per week    Comment: 07/29/2015 "big glass of wine q night"  . Drug use: No  . Sexual activity: Not on file  Lifestyle  . Physical activity:    Days per week: Not on file    Minutes per session: Not on file  . Stress: Not on file  Relationships  . Social connections:    Talks on phone: Not on file    Gets together: Not on file    Attends religious service: Not on file    Active member of club or organization: Not on file    Attends  meetings of clubs or organizations: Not on file    Relationship status: Not on file  . Intimate partner violence:    Fear of current or ex partner: Not on file    Emotionally abused: Not on file    Physically abused: Not on file    Forced sexual activity: Not on file  Other Topics Concern  . Not on file  Social History Narrative   Patient is single, has never been married.  Does not have any children.  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   He drinks approximately 3-4 glasses of wine per week.  No history of excessive alcohol use.  Occasionally smokes a cigar.   Spends winters in [place]         Past Surgical History:  Procedure Laterality Date  . BIOPSY  10/01/2011   Procedure: BIOPSY;  Surgeon: [name], MD;  [facility];   Service: Urology;;  biopsy of bladder tumor  . BLADDER SUSPENSION  08/18/10   [name]  . CARDIAC CATHETERIZATION N/A 07/29/2015   Procedure: Right/Left Heart Cath and Coronary Angiography;  Surgeon: Peter M Martinique, MD;  Location: [facility];  Service: Cardiovascular;  Laterality: N/A;  . CATARACT EXTRACTION W/ INTRAOCULAR LENS  IMPLANT, BILATERAL Bilateral   . CYSTOSCOPY W/ RETROGRADES  01/27/2012   Procedure: CYSTOSCOPY WITH RETROGRADE PYELOGRAM;  Surgeon: [name], MD;  [facility];  Service: Urology;  Laterality: Left;  . CYSTOSCOPY WITH RETROGRADE PYELOGRAM, URETEROSCOPY AND STENT PLACEMENT Left 11/24/2012   Procedure: CYSTOSCOPY WITH left RETROGRADE PYELOGRAM, bladder washings, ureteroscopy;  Surgeon: [name], MD;  [facility];  Service: Urology;  Laterality: Left;  . CYSTOSCOPY WITH RETROGRADE PYELOGRAM, URETEROSCOPY AND STENT PLACEMENT Bilateral 11/19/2013   Procedure: CYSTOSCOPY WITH BILATERAL RETROGRADE PYELOGRAM,;  Surgeon: [name], MD;  [facility];  Service: Urology;  Laterality: Bilateral;  . EYE SURGERY  09/24/10   left eye. "Retina surgery"  . INGUINAL HERNIA REPAIR  1960  . PROSTATE SURGERY     s/p laparoscopic prostate surgery  . SKIN BIOPSY  05/20/2017   Superficial and nodular basal cell carcinoma (left temple)    basal cell carcinoma with sclerosis (right temple)  . SKIN CANCER EXCISION     a/p skin cancer right foot-non melanoma  . TEE WITHOUT CARDIOVERSION N/A 07/29/2015   Procedure: TRANSESOPHAGEAL ECHOCARDIOGRAM (TEE);  Surgeon: Pixie Casino, MD;  Location: [facility];  Service: Cardiovascular;  Laterality: N/A;  cath to follow  . TRANSURETHRAL RESECTION OF BLADDER TUMOR  10/01/2011   Procedure: TRANSURETHRAL RESECTION OF BLADDER TUMOR (TURBT);  Surgeon: [name], MD;  [facility];  Service: Urology;  Laterality: N/A;  . TRANSURETHRAL RESECTION OF BLADDER TUMOR N/A 11/19/2013   Procedure: TRANSURETHRAL RESECTION OF BLADDER  TUMOR (TURBT);  Surgeon: [name], MD;  [facility];  Service: Urology;  Laterality: N/A;  . URETEROSCOPY  01/27/2012   Procedure: URETEROSCOPY;  Surgeon: [name], MD;  [facility];  Service: Urology;  Laterality: Left;  CYSTO, Left RETROGRADE PYELOGRPHY, LEFT URETEROSCOPY   . URINARY SPHINCTER IMPLANT    . VARICOSE VEIN SURGERY      Family History  Problem Relation Age of Onset  . Colon cancer Neg Hx   . Colon polyps Neg Hx     No Known Allergies  Current Outpatient Medications on File Prior to Visit  Medication Sig Dispense Refill  . amLODipine (NORVASC) 2.5 MG tablet TAKE 1 TABLET BY MOUTH EVERY  DAY 90 tablet 1  . B Complex-C (B-COMPLEX WITH VITAMIN C) tablet Take 1 tablet by mouth daily. Reported on 07/24/2015 90 tablet 3  . cholecalciferol (VITAMIN D) 1000 units tablet Take 1 tablet (1,000 Units total) by mouth daily. 90 tablet 3  . doxycycline (VIBRAMYCIN) 100 MG capsule Take 1 capsule (100 mg total) by mouth 2 (two) times daily. 14 capsule 0  . methylcellulose (ARTIFICIAL TEARS) 1 % ophthalmic solution Place 1 drop into both eyes 2 (two) times daily.     . metoprolol succinate (TOPROL-XL) 25 MG 24 hr tablet Take 1 tablet (25 mg total) by mouth at bedtime. 90 tablet 1  . mirabegron ER (MYRBETRIQ) 50 MG TB24 tablet Take 1 tablet (50 mg total) by mouth daily. 90 tablet 1  . Multiple Vitamins-Minerals (PRESERVISION/LUTEIN PO) Take 1 tablet by mouth daily.    . pravastatin (PRAVACHOL) 20 MG tablet Take 1 tablet (20 mg total) by mouth at bedtime. 90 tablet 1  . Solifenacin Succinate (VESICARE PO) Take by mouth.    [name] UNABLE TO FIND diosmin    . apixaban (ELIQUIS) 2.5 MG TABS tablet Take 1 tablet (2.5 mg total) by mouth 2 (two) times daily. 302 tablet 0  . furosemide (LASIX) 40 MG tablet Take 1 tablet (40 mg total) by mouth daily. 90 tablet 1  . KLOR-CON M10 10 MEQ tablet Take 1 tablet (10 mEq total) by mouth daily. 90 tablet 1   No current facility-administered  medications on file prior to visit.     BP (!) 110/52   Temp (!) 97.5 F (36.4 C) (Oral)   Wt 129 lb (58.5 kg)   BMI 18.51 kg/m       Objective:   Physical Exam  Constitutional: He is oriented to person, place, and time. He appears well-developed and well-nourished. No distress.  Cardiovascular: Normal rate and intact distal pulses. An irregularly irregular rhythm present. Exam reveals no gallop and no friction rub.  Murmur heard. Pulmonary/Chest: Effort normal and breath sounds normal. No stridor. No respiratory distress. He has no wheezes. He has no rales. He exhibits no tenderness.  Neurological: He is alert and oriented to person, place, and time.  Skin: Skin is warm and dry. He is not diaphoretic.  Psychiatric: He has a normal mood and affect. His behavior is normal. Judgment and thought content normal.  Nursing note and vitals reviewed.     Assessment & Plan:  1. Essential hypertension -Eyes any completely stopped Norvasc altogether.  I am okay with him coming off this medication and we will continue to monitor blood pressure.  2. Pure hypercholesterolemia -Okay to take statin every other day  3. Urinary incontinence, unspecified type -We will switch back to Myrbetriq.  He was advised to discontinue Vesicare due to side effects.  Continue to monitor that he was advised to follow-up if side effects do not improve - mirabegron ER (MYRBETRIQ) 50 MG TB24 tablet; Take 1 tablet (50 mg total) by mouth daily.  Dispense: 90 tablet; Refill: 3  [name], NP

## 2017-09-16 ENCOUNTER — Encounter: Payer: Self-pay | Admitting: Family Medicine

## 2017-09-16 ENCOUNTER — Telehealth: Payer: Self-pay | Admitting: Adult Health

## 2017-09-16 ENCOUNTER — Other Ambulatory Visit: Payer: Self-pay | Admitting: Adult Health

## 2017-09-16 NOTE — Telephone Encounter (Signed)
mirabegron ER (MYRBETRIQ) 50 MG TB24 tablet   Send to:  [id number] - , [place] - [address] [phone] (Phone) [phone] (Fax)

## 2017-09-16 NOTE — Telephone Encounter (Signed)
Sent in on 09/14/17.  Pt seen today by [name].  Pt was confused.

## 2017-09-23 DIAGNOSIS — H0100B Unspecified blepharitis left eye, upper and lower eyelids: Secondary | ICD-10-CM | POA: Diagnosis not present

## 2017-09-23 DIAGNOSIS — H04123 Dry eye syndrome of bilateral lacrimal glands: Secondary | ICD-10-CM | POA: Diagnosis not present

## 2017-09-23 DIAGNOSIS — H0100A Unspecified blepharitis right eye, upper and lower eyelids: Secondary | ICD-10-CM | POA: Diagnosis not present

## 2017-09-27 DIAGNOSIS — C44729 Squamous cell carcinoma of skin of left lower limb, including hip: Secondary | ICD-10-CM | POA: Diagnosis not present

## 2017-09-27 DIAGNOSIS — C44310 Basal cell carcinoma of skin of unspecified parts of face: Secondary | ICD-10-CM | POA: Diagnosis not present

## 2017-09-27 DIAGNOSIS — D485 Neoplasm of uncertain behavior of skin: Secondary | ICD-10-CM | POA: Diagnosis not present

## 2017-09-28 ENCOUNTER — Encounter: Payer: Self-pay | Admitting: Cardiovascular Disease

## 2017-09-28 ENCOUNTER — Encounter (INDEPENDENT_AMBULATORY_CARE_PROVIDER_SITE_OTHER): Payer: Self-pay

## 2017-09-28 ENCOUNTER — Ambulatory Visit (INDEPENDENT_AMBULATORY_CARE_PROVIDER_SITE_OTHER): Payer: Medicare Other | Admitting: Cardiovascular Disease

## 2017-09-28 VITALS — BP 140/66 | HR 65 | Ht 70.0 in | Wt 131.0 lb

## 2017-09-28 DIAGNOSIS — I482 Chronic atrial fibrillation, unspecified: Secondary | ICD-10-CM

## 2017-09-28 DIAGNOSIS — Z7901 Long term (current) use of anticoagulants: Secondary | ICD-10-CM

## 2017-09-28 LAB — BASIC METABOLIC PANEL
BUN/Creatinine Ratio: 18 (ref 10–24)
BUN: 19 mg/dL (ref 8–27)
CALCIUM: 9.7 mg/dL (ref 8.6–10.2)
CHLORIDE: 103 mmol/L (ref 96–106)
CO2: 27 mmol/L (ref 20–29)
Creatinine, Ser: 1.04 mg/dL (ref 0.76–1.27)
GFR calc non Af Amer: 64 mL/min/{1.73_m2} (ref >59)
GFR, EST AFRICAN AMERICAN: 74 mL/min/{1.73_m2} (ref >59)
Glucose: 93 mg/dL (ref 65–99)
POTASSIUM: 5 mmol/L (ref 3.5–5.2)
Sodium: 142 mmol/L (ref 134–144)

## 2017-09-28 LAB — CBC
Hematocrit: 40.3 % (ref 37.5–51.0)
Hemoglobin: 13.2 g/dL (ref 13.0–17.7)
MCH: 30.8 pg (ref 26.6–33.0)
MCHC: 32.8 g/dL (ref 31.5–35.7)
MCV: 94 fL (ref 79–97)
PLATELETS: 174 10*3/uL (ref 150–450)
RBC: 4.29 x10E6/uL (ref 4.14–5.80)
RDW: 12.2 % — AB (ref 12.3–15.4)
WBC: 8.1 10*3/uL (ref 3.4–10.8)

## 2017-09-28 NOTE — Patient Instructions (Addendum)
Medication Instructions:  Your physician recommends that you continue on your current medications as directed. Please refer to the Current Medication list given to you today.   Labwork: TODAY - CBC, BMET   Testing/Procedures: None Ordered   Follow-Up: Your physician recommends that you return for a follow-up appointment on August 29 at 2:40 pm   If you need a refill on your cardiac medications before your next appointment, please call your pharmacy.   Thank you for choosing [facility]! Christen Bame, RN [phone]
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
## 2017-10-03 ENCOUNTER — Ambulatory Visit (INDEPENDENT_AMBULATORY_CARE_PROVIDER_SITE_OTHER): Payer: Medicare Other | Admitting: Adult Health

## 2017-10-03 ENCOUNTER — Encounter: Payer: Self-pay | Admitting: Adult Health

## 2017-10-03 VITALS — BP 130/72 | Temp 98.5°F | Wt 123.0 lb

## 2017-10-03 DIAGNOSIS — H5310 Unspecified subjective visual disturbances: Secondary | ICD-10-CM

## 2017-10-03 NOTE — Progress Notes (Signed)
Subjective:    Patient ID: DEVERE BREM, male    DOB: 06/07/1926, 84 y.o.   MRN: [id number]  HPI  84 year old male who  has a past medical history of Arthritis, Carotid artery stenosis (11/2008), Colon polyps, Diverticulosis, Hepatitis, Hyperlipidemia, Hypertension, Numbness, Phimosis, Prostate cancer ([place]) (2001), and Urothelial carcinoma of left distal ureter ([place]).  He presents to the office today with a complaint of "unable to focus with my eyes".  His biggest concern is that when he starts reading his eyes become fatigued and is unable to read for an extended period of time.  He denies any blurred vision or blind spots his vision, or headaches. He denies seeing wavy lines in his vision   He has an upcoming eye appointment    Review of Systems See HPI   Past Medical History:  Diagnosis Date  . Arthritis   . Carotid artery stenosis 11/2008   bilateral 40-59% stenosis  . Colon polyps   . Diverticulosis   . Hepatitis    doesn't know which type 1969  . Hyperlipidemia   . Hypertension   . Numbness    fingertips  . Phimosis   . Prostate cancer ([place]) 2001  . Urothelial carcinoma of left distal ureter [facility])     Social History   Socioeconomic History  . Marital status: Single    Spouse name: Not on file  . Number of children: 0  . Years of education: Not on file  . Highest education level: Not on file  Occupational History  . Occupation: Retired  Scientific laboratory technician  . Financial resource strain: Not on file  . Food insecurity:    Worry: Not on file    Inability: Not on file  . Transportation needs:    Medical: Not on file    Non-medical: Not on file  Tobacco Use  . Smoking status: Former Research scientist (life sciences)  . Smokeless tobacco: Never Used  . Tobacco comment: 07/29/2015 "might smoke a cigar once/year"  Substance and Sexual Activity  . Alcohol use: Yes    Alcohol/week: 8.4 oz    Types: 14 Glasses of wine per week    Comment: 07/29/2015 "big glass of wine q night"  . Drug use: No   . Sexual activity: Not on file  Lifestyle  . Physical activity:    Days per week: Not on file    Minutes per session: Not on file  . Stress: Not on file  Relationships  . Social connections:    Talks on phone: Not on file    Gets together: Not on file    Attends religious service: Not on file    Active member of club or organization: Not on file    Attends meetings of clubs or organizations: Not on file    Relationship status: Not on file  . Intimate partner violence:    Fear of current or ex partner: Not on file    Emotionally abused: Not on file    Physically abused: Not on file    Forced sexual activity: Not on file  Other Topics Concern  . Not on file  Social History Narrative   Patient is single, has never been married.  Does not have any children.  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   He drinks approximately 3-4 glasses of wine per week.  No history of excessive alcohol use.  Occasionally smokes a cigar.   Spends winters in [place]  Past Surgical History:  Procedure Laterality Date  . BIOPSY  10/01/2011   Procedure: BIOPSY;  Surgeon: [name], MD;  [facility]: [facility];  Service: Urology;;  biopsy of bladder tumor  . BLADDER SUSPENSION  08/18/10   [name]  . CARDIAC CATHETERIZATION N/A 07/29/2015   Procedure: Right/Left Heart Cath and Coronary Angiography;  Surgeon: Peter M Martinique, MD;  Location: [place] [facility];  Service: Cardiovascular;  Laterality: N/A;  . CATARACT EXTRACTION W/ INTRAOCULAR LENS  IMPLANT, BILATERAL Bilateral   . CYSTOSCOPY W/ RETROGRADES  01/27/2012   Procedure: CYSTOSCOPY WITH RETROGRADE PYELOGRAM;  Surgeon: [name], MD;  [facility];  Service: Urology;  Laterality: Left;  . CYSTOSCOPY WITH RETROGRADE PYELOGRAM, URETEROSCOPY AND STENT PLACEMENT Left 11/24/2012   Procedure: CYSTOSCOPY WITH left RETROGRADE PYELOGRAM, bladder washings, ureteroscopy;  Surgeon: [name], MD;  [facility];  Service: Urology;   Laterality: Left;  . CYSTOSCOPY WITH RETROGRADE PYELOGRAM, URETEROSCOPY AND STENT PLACEMENT Bilateral 11/19/2013   Procedure: CYSTOSCOPY WITH BILATERAL RETROGRADE PYELOGRAM,;  Surgeon: [name], MD;  [facility]: [facility];  Service: Urology;  Laterality: Bilateral;  . EYE SURGERY  09/24/10   left eye. "Retina surgery"  . INGUINAL HERNIA REPAIR  1960  . PROSTATE SURGERY     s/p laparoscopic prostate surgery  . SKIN BIOPSY  05/20/2017   Superficial and nodular basal cell carcinoma (left temple)    basal cell carcinoma with sclerosis (right temple)  . SKIN CANCER EXCISION     a/p skin cancer right foot-non melanoma  . TEE WITHOUT CARDIOVERSION N/A 07/29/2015   Procedure: TRANSESOPHAGEAL ECHOCARDIOGRAM (TEE);  Surgeon: Pixie Casino, MD;  Location: [facility];  Service: Cardiovascular;  Laterality: N/A;  cath to follow  . TRANSURETHRAL RESECTION OF BLADDER TUMOR  10/01/2011   Procedure: TRANSURETHRAL RESECTION OF BLADDER TUMOR (TURBT);  Surgeon: [name], MD;  [facility];  Service: Urology;  Laterality: N/A;  . TRANSURETHRAL RESECTION OF BLADDER TUMOR N/A 11/19/2013   Procedure: TRANSURETHRAL RESECTION OF BLADDER TUMOR (TURBT);  Surgeon: [name], MD;  [facility];  Service: Urology;  Laterality: N/A;  . URETEROSCOPY  01/27/2012   Procedure: URETEROSCOPY;  Surgeon: [name], MD;  [facility];  Service: Urology;  Laterality: Left;  CYSTO, Left RETROGRADE PYELOGRPHY, LEFT URETEROSCOPY   . URINARY SPHINCTER IMPLANT    . VARICOSE VEIN SURGERY      Family History  Problem Relation Age of Onset  . Colon cancer Neg Hx   . Colon polyps Neg Hx     No Known Allergies  Current Outpatient Medications on File Prior to Visit  Medication Sig Dispense Refill  . B Complex-C (B-COMPLEX WITH VITAMIN C) tablet Take 1 tablet by mouth daily. Reported on 07/24/2015 90 tablet 3  . cholecalciferol (VITAMIN D) 1000 units tablet Take 1 tablet (1,000 Units total) by mouth daily. 90 tablet  3  . methylcellulose (ARTIFICIAL TEARS) 1 % ophthalmic solution Place 1 drop into both eyes 2 (two) times daily.     . metoprolol succinate (TOPROL-XL) 25 MG 24 hr tablet Take 1 tablet (25 mg total) by mouth at bedtime. 90 tablet 1  . mirabegron ER (MYRBETRIQ) 50 MG TB24 tablet Take 1 tablet (50 mg total) by mouth daily. 90 tablet 3  . Multiple Vitamins-Minerals (PRESERVISION/LUTEIN PO) Take 1 tablet by mouth daily.    . potassium chloride (K-DUR,KLOR-CON) 10 MEQ tablet Take 10 mEq by mouth daily.    . pravastatin (PRAVACHOL) 20 MG tablet Take 1  tablet (20 mg total) by mouth at bedtime. 90 tablet 1  . Solifenacin Succinate (VESICARE PO) Take by mouth.    [name] UNABLE TO FIND diosmin    . apixaban (ELIQUIS) 2.5 MG TABS tablet Take 1 tablet (2.5 mg total) by mouth 2 (two) times daily. 302 tablet 0  . furosemide (LASIX) 40 MG tablet Take 1 tablet (40 mg total) by mouth daily. 90 tablet 1   No current facility-administered medications on file prior to visit.     BP 130/72   Temp 98.5 F (36.9 C) (Oral)   Wt 123 lb (55.8 kg)   BMI 17.65 kg/m       Objective:   Physical Exam  Constitutional: He is oriented to person, place, and time. He appears well-developed. He appears cachectic. No distress.  Eyes: Pupils are equal, round, and reactive to light. Conjunctivae and EOM are normal.  Cardiovascular: Normal rate, normal heart sounds and intact distal pulses. An irregularly irregular rhythm present.  Pulmonary/Chest: Effort normal and breath sounds normal.  Neurological: He is alert and oriented to person, place, and time.  Skin: Skin is warm and dry. Capillary refill takes less than 2 seconds. He is not diaphoretic.  Psychiatric: He has a normal mood and affect. His behavior is normal. Judgment and thought content normal.  Nursing note and vitals reviewed.     Assessment & Plan:  1. Eye fatigue, bilateral - Advised follow up with eye doctor   [name], NP

## 2017-10-27 ENCOUNTER — Ambulatory Visit (INDEPENDENT_AMBULATORY_CARE_PROVIDER_SITE_OTHER): Payer: Medicare Other | Admitting: Adult Health

## 2017-10-27 ENCOUNTER — Ambulatory Visit (INDEPENDENT_AMBULATORY_CARE_PROVIDER_SITE_OTHER): Payer: Medicare Other

## 2017-10-27 ENCOUNTER — Encounter: Payer: Self-pay | Admitting: Adult Health

## 2017-10-27 VITALS — BP 110/66 | Temp 98.4°F | Wt 125.0 lb

## 2017-10-27 DIAGNOSIS — R05 Cough: Secondary | ICD-10-CM | POA: Diagnosis not present

## 2017-10-27 DIAGNOSIS — R0602 Shortness of breath: Secondary | ICD-10-CM

## 2017-10-27 DIAGNOSIS — R5383 Other fatigue: Secondary | ICD-10-CM | POA: Diagnosis not present

## 2017-10-27 LAB — BASIC METABOLIC PANEL
BUN: 25 mg/dL — ABNORMAL HIGH (ref 6–23)
CALCIUM: 9.7 mg/dL (ref 8.4–10.5)
CO2: 36 mEq/L — ABNORMAL HIGH (ref 19–32)
Chloride: 102 mEq/L (ref 96–112)
Creatinine, Ser: 1.34 mg/dL (ref 0.40–1.50)
GFR: 53.37 mL/min — AB (ref >60.00)
GLUCOSE: 94 mg/dL (ref 70–99)
Potassium: 4.3 mEq/L (ref 3.5–5.1)
SODIUM: 142 meq/L (ref 135–145)

## 2017-10-27 LAB — CBC WITH DIFFERENTIAL/PLATELET
BASOS PCT: 0.5 % (ref 0.0–3.0)
Basophils Absolute: 0 10*3/uL (ref 0.0–0.1)
EOS ABS: 0.2 10*3/uL (ref 0.0–0.7)
EOS PCT: 3.3 % (ref 0.0–5.0)
HCT: 42 % (ref 39.0–52.0)
HEMOGLOBIN: 14.2 g/dL (ref 13.0–17.0)
LYMPHS ABS: 1.4 10*3/uL (ref 0.7–4.0)
Lymphocytes Relative: 20.7 % (ref 12.0–46.0)
MCHC: 33.8 g/dL (ref 30.0–36.0)
MCV: 94.4 fl (ref 78.0–100.0)
MONO ABS: 0.4 10*3/uL (ref 0.1–1.0)
Monocytes Relative: 5.9 % (ref 3.0–12.0)
NEUTROS ABS: 4.7 10*3/uL (ref 1.4–7.7)
NEUTROS PCT: 69.6 % (ref 43.0–77.0)
PLATELETS: 195 10*3/uL (ref 150.0–400.0)
RBC: 4.45 Mil/uL (ref 4.22–5.81)
RDW: 13.7 % (ref 11.5–15.5)
WBC: 6.8 10*3/uL (ref 4.0–10.5)

## 2017-10-27 NOTE — Progress Notes (Signed)
Subjective:    Patient ID: Jeremy Kelley, male    DOB: 02/26/1927, 84 y.o.   MRN: [id number]  HPI  84 year old male who  has a past medical history of Arthritis, Carotid artery stenosis (11/2008), Colon polyps, Diverticulosis, Hepatitis, Hyperlipidemia, Hypertension, Numbness, Phimosis, Prostate cancer ([place]) (2001), and Urothelial carcinoma of left distal ureter ([place]).  He presents to the office today for worsening fatigue and shortness of breath over the last week. Reports that his symptoms are worse in the morning about 30 minutes after taking his morning medications( Eliquis, Lasix, Myrbetriq. The complaint of shortness of breath is both at rest and with exertion. Associated symptoms include that of feeling as though he has had 2-3 episodes of " my heart racing", this sensation lasts no longer than a couple of minutes.  He denies any chest pain, fevers, productive cough, sinus pain or pressure, or wheezing  Hx of A fib and aortic insufficiency  Review of Systems See HPI   Past Medical History:  Diagnosis Date  . Arthritis   . Carotid artery stenosis 11/2008   bilateral 40-59% stenosis  . Colon polyps   . Diverticulosis   . Hepatitis    doesn't know which type 1969  . Hyperlipidemia   . Hypertension   . Numbness    fingertips  . Phimosis   . Prostate cancer ([place]) 2001  . Urothelial carcinoma of left distal ureter [facility])     Social History   Socioeconomic History  . Marital status: Single    Spouse name: Not on file  . Number of children: 0  . Years of education: Not on file  . Highest education level: Not on file  Occupational History  . Occupation: Retired  Scientific laboratory technician  . Financial resource strain: Not on file  . Food insecurity:    Worry: Not on file    Inability: Not on file  . Transportation needs:    Medical: Not on file    Non-medical: Not on file  Tobacco Use  . Smoking status: Former Research scientist (life sciences)  . Smokeless tobacco: Never Used  . Tobacco comment:  07/29/2015 "might smoke a cigar once/year"  Substance and Sexual Activity  . Alcohol use: Yes    Alcohol/week: 8.4 oz    Types: 14 Glasses of wine per week    Comment: 07/29/2015 "big glass of wine q night"  . Drug use: No  . Sexual activity: Not on file  Lifestyle  . Physical activity:    Days per week: Not on file    Minutes per session: Not on file  . Stress: Not on file  Relationships  . Social connections:    Talks on phone: Not on file    Gets together: Not on file    Attends religious service: Not on file    Active member of club or organization: Not on file    Attends meetings of clubs or organizations: Not on file    Relationship status: Not on file  . Intimate partner violence:    Fear of current or ex partner: Not on file    Emotionally abused: Not on file    Physically abused: Not on file    Forced sexual activity: Not on file  Other Topics Concern  . Not on file  Social History Narrative   Patient is single, has never been married.  Does not have any children.  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   He drinks approximately  3-4 glasses of wine per week.  No history of excessive alcohol use.  Occasionally smokes a cigar.   Spends winters in [place]         Past Surgical History:  Procedure Laterality Date  . BIOPSY  10/01/2011   Procedure: BIOPSY;  Surgeon: [name], MD;  [facility];  Service: Urology;;  biopsy of bladder tumor  . BLADDER SUSPENSION  08/18/10   [name]  . CARDIAC CATHETERIZATION N/A 07/29/2015   Procedure: Right/Left Heart Cath and Coronary Angiography;  Surgeon: Peter M Martinique, MD;  Location: [place] [facility];  Service: Cardiovascular;  Laterality: N/A;  . CATARACT EXTRACTION W/ INTRAOCULAR LENS  IMPLANT, BILATERAL Bilateral   . CYSTOSCOPY W/ RETROGRADES  01/27/2012   Procedure: CYSTOSCOPY WITH RETROGRADE PYELOGRAM;  Surgeon: [name], MD;  [facility];  Service: Urology;  Laterality: Left;  . CYSTOSCOPY  WITH RETROGRADE PYELOGRAM, URETEROSCOPY AND STENT PLACEMENT Left 11/24/2012   Procedure: CYSTOSCOPY WITH left RETROGRADE PYELOGRAM, bladder washings, ureteroscopy;  Surgeon: [name], MD;  [facility];  Service: Urology;  Laterality: Left;  . CYSTOSCOPY WITH RETROGRADE PYELOGRAM, URETEROSCOPY AND STENT PLACEMENT Bilateral 11/19/2013   Procedure: CYSTOSCOPY WITH BILATERAL RETROGRADE PYELOGRAM,;  Surgeon: [name], MD;  [facility];  Service: Urology;  Laterality: Bilateral;  . EYE SURGERY  09/24/10   left eye. "Retina surgery"  . INGUINAL HERNIA REPAIR  1960  . PROSTATE SURGERY     s/p laparoscopic prostate surgery  . SKIN BIOPSY  05/20/2017   Superficial and nodular basal cell carcinoma (left temple)    basal cell carcinoma with sclerosis (right temple)  . SKIN CANCER EXCISION     a/p skin cancer right foot-non melanoma  . TEE WITHOUT CARDIOVERSION N/A 07/29/2015   Procedure: TRANSESOPHAGEAL ECHOCARDIOGRAM (TEE);  Surgeon: Pixie Casino, MD;  Location: [facility];  Service: Cardiovascular;  Laterality: N/A;  cath to follow  . TRANSURETHRAL RESECTION OF BLADDER TUMOR  10/01/2011   Procedure: TRANSURETHRAL RESECTION OF BLADDER TUMOR (TURBT);  Surgeon: [name], MD;  [facility];  Service: Urology;  Laterality: N/A;  . TRANSURETHRAL RESECTION OF BLADDER TUMOR N/A 11/19/2013   Procedure: TRANSURETHRAL RESECTION OF BLADDER TUMOR (TURBT);  Surgeon: [name], MD;  [facility];  Service: Urology;  Laterality: N/A;  . URETEROSCOPY  01/27/2012   Procedure: URETEROSCOPY;  Surgeon: [name], MD;  [facility];  Service: Urology;  Laterality: Left;  CYSTO, Left RETROGRADE PYELOGRPHY, LEFT URETEROSCOPY   . URINARY SPHINCTER IMPLANT    . VARICOSE VEIN SURGERY      Family History  Problem Relation Age of Onset  . Colon cancer Neg Hx   . Colon polyps Neg Hx     No Known Allergies  Current Outpatient Medications on File Prior to Visit  Medication Sig Dispense  Refill  . B Complex-C (B-COMPLEX WITH VITAMIN C) tablet Take 1 tablet by mouth daily. Reported on 07/24/2015 90 tablet 3  . cholecalciferol (VITAMIN D) 1000 units tablet Take 1 tablet (1,000 Units total) by mouth daily. 90 tablet 3  . methylcellulose (ARTIFICIAL TEARS) 1 % ophthalmic solution Place 1 drop into both eyes 2 (two) times daily.     . metoprolol succinate (TOPROL-XL) 25 MG 24 hr tablet Take 1 tablet (25 mg total) by mouth at bedtime. 90 tablet 1  . mirabegron ER (MYRBETRIQ) 50 MG TB24 tablet Take 1 tablet (50 mg total) by mouth daily. 90 tablet 3  . Multiple Vitamins-Minerals (PRESERVISION/LUTEIN PO) Take  1 tablet by mouth daily.    . potassium chloride (K-DUR,KLOR-CON) 10 MEQ tablet Take 10 mEq by mouth daily.    . pravastatin (PRAVACHOL) 20 MG tablet Take 1 tablet (20 mg total) by mouth at bedtime. 90 tablet 1  . Solifenacin Succinate (VESICARE PO) Take by mouth.    [name] UNABLE TO FIND diosmin    . apixaban (ELIQUIS) 2.5 MG TABS tablet Take 1 tablet (2.5 mg total) by mouth 2 (two) times daily. 302 tablet 0  . furosemide (LASIX) 40 MG tablet Take 1 tablet (40 mg total) by mouth daily. 90 tablet 1   No current facility-administered medications on file prior to visit.     BP 110/66   Temp 98.4 F (36.9 C) (Oral)   Wt 125 lb (56.7 kg)   BMI 17.94 kg/m       Objective:   Physical Exam  Constitutional: He is oriented to person, place, and time. He appears well-developed and well-nourished. No distress.  Cardiovascular: Normal rate and intact distal pulses. An irregularly irregular rhythm present. Exam reveals no gallop and no friction rub.  Murmur heard. Pulmonary/Chest: Effort normal and breath sounds normal. No apnea and no tachypnea. No respiratory distress.  Neurological: He is alert and oriented to person, place, and time.  Skin: Skin is warm and dry. Capillary refill takes less than 2 seconds. He is not diaphoretic.  Psychiatric: He has a normal mood and affect. His  behavior is normal. Judgment and thought content normal.  Nursing note and vitals reviewed.     Assessment & Plan:  1. Other fatigue - Likely due to a fib and aortic insufficiency.  - EKG 12-Lead- Atrial fibrillation  -Left axis -anterior fascicular block.   -Anteroseptal infarct -age undetermined. Rate 77  - CBC with Differential/Platelet - Basic Metabolic Panel  2. Shortness of breath - lung clear but will get chest xray to r/o infection  - EKG 12-Lead - CBC with Differential/Platelet - Basic Metabolic Panel - DG Chest 2 View; Future   [name], NP

## 2017-10-28 DIAGNOSIS — D485 Neoplasm of uncertain behavior of skin: Secondary | ICD-10-CM | POA: Diagnosis not present

## 2017-10-28 DIAGNOSIS — C44729 Squamous cell carcinoma of skin of left lower limb, including hip: Secondary | ICD-10-CM | POA: Diagnosis not present

## 2017-10-31 DIAGNOSIS — M5134 Other intervertebral disc degeneration, thoracic region: Secondary | ICD-10-CM | POA: Diagnosis not present

## 2017-10-31 DIAGNOSIS — M9902 Segmental and somatic dysfunction of thoracic region: Secondary | ICD-10-CM | POA: Diagnosis not present

## 2017-10-31 DIAGNOSIS — M9903 Segmental and somatic dysfunction of lumbar region: Secondary | ICD-10-CM | POA: Diagnosis not present

## 2017-10-31 DIAGNOSIS — M5136 Other intervertebral disc degeneration, lumbar region: Secondary | ICD-10-CM | POA: Diagnosis not present

## 2017-11-07 ENCOUNTER — Telehealth: Payer: Self-pay | Admitting: Adult Health

## 2017-11-07 DIAGNOSIS — M9903 Segmental and somatic dysfunction of lumbar region: Secondary | ICD-10-CM | POA: Diagnosis not present

## 2017-11-07 DIAGNOSIS — M5134 Other intervertebral disc degeneration, thoracic region: Secondary | ICD-10-CM | POA: Diagnosis not present

## 2017-11-07 DIAGNOSIS — M9902 Segmental and somatic dysfunction of thoracic region: Secondary | ICD-10-CM | POA: Diagnosis not present

## 2017-11-07 DIAGNOSIS — M5136 Other intervertebral disc degeneration, lumbar region: Secondary | ICD-10-CM | POA: Diagnosis not present

## 2017-11-07 NOTE — Telephone Encounter (Signed)
Jeremy Kelley has been seeing various doctors in the recent weeks.  He wants [name] to know that one of the important appointments he has is this Thursday 11/10/17 at the [facility].  He has an appointment next week with [facility] as well.

## 2017-11-08 NOTE — Telephone Encounter (Signed)
Noted  

## 2017-11-10 DIAGNOSIS — C44319 Basal cell carcinoma of skin of other parts of face: Secondary | ICD-10-CM | POA: Diagnosis not present

## 2017-11-11 ENCOUNTER — Ambulatory Visit (INDEPENDENT_AMBULATORY_CARE_PROVIDER_SITE_OTHER): Payer: Medicare Other | Admitting: Adult Health

## 2017-11-11 ENCOUNTER — Encounter: Payer: Self-pay | Admitting: Adult Health

## 2017-11-11 VITALS — BP 150/70 | Temp 97.8°F | Wt 126.0 lb

## 2017-11-11 DIAGNOSIS — R0982 Postnasal drip: Secondary | ICD-10-CM

## 2017-11-11 MED ORDER — FLUTICASONE PROPIONATE 50 MCG/ACT NA SUSP: 2.0000 | Freq: Every day | NASAL | 6 refills | Status: DC

## 2017-11-11 NOTE — Progress Notes (Signed)
Subjective:    Patient ID: SERAPIO EDELSON, male    DOB: [birth year], 84 y.o.   MRN: [id number]  HPI 84 year old male who  has a past medical history of Arthritis, Carotid artery stenosis (11/2008), Colon polyps, Diverticulosis, Hepatitis, Hyperlipidemia, Hypertension, Numbness, Phimosis, Prostate cancer ([place]) (2001), and Urothelial carcinoma of left distal ureter ([place]).  He presents to the office today for an acute issue of " too much phlegm in my throat" He is unsure how long this been present. Denies dysphagia, fevers, sinus pain or pressure. He reports that he will wake up in the morning and feel as though he needs to cough up phlegm. His symptoms are also apparent when eating.   Review of Systems See HPI   Past Medical History:  Diagnosis Date  . Arthritis   . Carotid artery stenosis 11/2008   bilateral 40-59% stenosis  . Colon polyps   . Diverticulosis   . Hepatitis    doesn't know which type 1969  . Hyperlipidemia   . Hypertension   . Numbness    fingertips  . Phimosis   . Prostate cancer ([place]) 2001  . Urothelial carcinoma of left distal ureter [facility])     Social History   Socioeconomic History  . Marital status: Single    Spouse name: Not on file  . Number of children: 0  . Years of education: Not on file  . Highest education level: Not on file  Occupational History  . Occupation: Retired  Scientific laboratory technician  . Financial resource strain: Not on file  . Food insecurity:    Worry: Not on file    Inability: Not on file  . Transportation needs:    Medical: Not on file    Non-medical: Not on file  Tobacco Use  . Smoking status: Former Research scientist (life sciences)  . Smokeless tobacco: Never Used  . Tobacco comment: 07/29/2015 "might smoke a cigar once/year"  Substance and Sexual Activity  . Alcohol use: Yes    Alcohol/week: 8.4 oz    Types: 14 Glasses of wine per week    Comment: 07/29/2015 "big glass of wine q night"  . Drug use: No  . Sexual activity: Not on file  Lifestyle  .  Physical activity:    Days per week: Not on file    Minutes per session: Not on file  . Stress: Not on file  Relationships  . Social connections:    Talks on phone: Not on file    Gets together: Not on file    Attends religious service: Not on file    Active member of club or organization: Not on file    Attends meetings of clubs or organizations: Not on file    Relationship status: Not on file  . Intimate partner violence:    Fear of current or ex partner: Not on file    Emotionally abused: Not on file    Physically abused: Not on file    Forced sexual activity: Not on file  Other Topics Concern  . Not on file  Social History Narrative   Patient is single, has never been married.  Does not have any children.  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   He drinks approximately 3-4 glasses of wine per week.  No history of excessive alcohol use.  Occasionally smokes a cigar.   Spends winters in [place]         Past Surgical History:  Procedure Laterality Date  .  BIOPSY  10/01/2011   Procedure: BIOPSY;  Surgeon: [name], MD;  [facility];  Service: Urology;;  biopsy of bladder tumor  . BLADDER SUSPENSION  08/18/10   [name]  . CARDIAC CATHETERIZATION N/A 07/29/2015   Procedure: Right/Left Heart Cath and Coronary Angiography;  Surgeon: Peter M Martinique, MD;  Location: [place] [facility];  Service: Cardiovascular;  Laterality: N/A;  . CATARACT EXTRACTION W/ INTRAOCULAR LENS  IMPLANT, BILATERAL Bilateral   . CYSTOSCOPY W/ RETROGRADES  01/27/2012   Procedure: CYSTOSCOPY WITH RETROGRADE PYELOGRAM;  Surgeon: [name], MD;  [facility];  Service: Urology;  Laterality: Left;  . CYSTOSCOPY WITH RETROGRADE PYELOGRAM, URETEROSCOPY AND STENT PLACEMENT Left 11/24/2012   Procedure: CYSTOSCOPY WITH left RETROGRADE PYELOGRAM, bladder washings, ureteroscopy;  Surgeon: [name], MD;  [facility];  Service: Urology;  Laterality: Left;  . CYSTOSCOPY WITH  RETROGRADE PYELOGRAM, URETEROSCOPY AND STENT PLACEMENT Bilateral 11/19/2013   Procedure: CYSTOSCOPY WITH BILATERAL RETROGRADE PYELOGRAM,;  Surgeon: [name], MD;  [facility];  Service: Urology;  Laterality: Bilateral;  . EYE SURGERY  09/24/10   left eye. "Retina surgery"  . INGUINAL HERNIA REPAIR  1960  . PROSTATE SURGERY     s/p laparoscopic prostate surgery  . SKIN BIOPSY  05/20/2017   Superficial and nodular basal cell carcinoma (left temple)    basal cell carcinoma with sclerosis (right temple)  . SKIN CANCER EXCISION     a/p skin cancer right foot-non melanoma  . TEE WITHOUT CARDIOVERSION N/A 07/29/2015   Procedure: TRANSESOPHAGEAL ECHOCARDIOGRAM (TEE);  Surgeon: Pixie Casino, MD;  Location: [facility];  Service: Cardiovascular;  Laterality: N/A;  cath to follow  . TRANSURETHRAL RESECTION OF BLADDER TUMOR  10/01/2011   Procedure: TRANSURETHRAL RESECTION OF BLADDER TUMOR (TURBT);  Surgeon: [name], MD;  [facility];  Service: Urology;  Laterality: N/A;  . TRANSURETHRAL RESECTION OF BLADDER TUMOR N/A 11/19/2013   Procedure: TRANSURETHRAL RESECTION OF BLADDER TUMOR (TURBT);  Surgeon: [name], MD;  [facility];  Service: Urology;  Laterality: N/A;  . URETEROSCOPY  01/27/2012   Procedure: URETEROSCOPY;  Surgeon: [name], MD;  [facility];  Service: Urology;  Laterality: Left;  CYSTO, Left RETROGRADE PYELOGRPHY, LEFT URETEROSCOPY   . URINARY SPHINCTER IMPLANT    . VARICOSE VEIN SURGERY      Family History  Problem Relation Age of Onset  . Colon cancer Neg Hx   . Colon polyps Neg Hx     No Known Allergies  Current Outpatient Medications on File Prior to Visit  Medication Sig Dispense Refill  . B Complex-C (B-COMPLEX WITH VITAMIN C) tablet Take 1 tablet by mouth daily. Reported on 07/24/2015 90 tablet 3  . cholecalciferol (VITAMIN D) 1000 units tablet Take 1 tablet (1,000 Units total) by mouth daily. 90 tablet 3  . methylcellulose (ARTIFICIAL  TEARS) 1 % ophthalmic solution Place 1 drop into both eyes 2 (two) times daily.     . mirabegron ER (MYRBETRIQ) 50 MG TB24 tablet Take 1 tablet (50 mg total) by mouth daily. 90 tablet 3  . Multiple Vitamins-Minerals (PRESERVISION/LUTEIN PO) Take 1 tablet by mouth daily.    . potassium chloride (K-DUR,KLOR-CON) 10 MEQ tablet Take 10 mEq by mouth daily.    . Solifenacin Succinate (VESICARE PO) Take by mouth.    [name] UNABLE TO FIND diosmin    . apixaban (ELIQUIS) 2.5 MG TABS tablet Take 1 tablet (2.5 mg total) by mouth 2 (two) times daily. 302 tablet  0  . furosemide (LASIX) 40 MG tablet Take 1 tablet (40 mg total) by mouth daily. 90 tablet 1  . metoprolol succinate (TOPROL-XL) 25 MG 24 hr tablet Take 1 tablet (25 mg total) by mouth at bedtime. 90 tablet 1  . pravastatin (PRAVACHOL) 20 MG tablet Take 1 tablet (20 mg total) by mouth at bedtime. 90 tablet 1   No current facility-administered medications on file prior to visit.     BP (!) 150/70   Temp 97.8 F (36.6 C) (Oral)   Wt 126 lb (57.2 kg)   BMI 18.08 kg/m       Objective:   Physical Exam  Constitutional: He is oriented to person, place, and time. He appears well-developed and well-nourished. No distress.  HENT:  Nose: No mucosal edema or rhinorrhea.  Mouth/Throat: Uvula is midline and mucous membranes are normal.  + clear PND   Eyes: Pupils are equal, round, and reactive to light. EOM are normal.  Neurological: He is alert and oriented to person, place, and time.  Skin: Skin is warm and dry. He is not diaphoretic.  Bandage on left temple from recent dermatological procedure   Psychiatric: He has a normal mood and affect. His behavior is normal. Judgment and thought content normal.  Nursing note and vitals reviewed.     Assessment & Plan:  1. Post-nasal drip - fluticasone (FLONASE) 50 MCG/ACT nasal spray; Place 2 sprays into both nostrils daily.  Dispense: 16 g; Refill: 6 - Follow up if not improved   [name], NP

## 2017-11-15 ENCOUNTER — Ambulatory Visit: Payer: Medicare Other | Admitting: Adult Health

## 2017-11-21 DIAGNOSIS — C44729 Squamous cell carcinoma of skin of left lower limb, including hip: Secondary | ICD-10-CM | POA: Diagnosis not present

## 2017-11-29 ENCOUNTER — Ambulatory Visit (INDEPENDENT_AMBULATORY_CARE_PROVIDER_SITE_OTHER): Payer: Medicare Other | Admitting: Adult Health

## 2017-11-29 ENCOUNTER — Encounter: Payer: Self-pay | Admitting: Adult Health

## 2017-11-29 VITALS — BP 118/56 | HR 68 | Temp 97.7°F | Wt 124.6 lb

## 2017-11-29 DIAGNOSIS — R1310 Dysphagia, unspecified: Secondary | ICD-10-CM | POA: Diagnosis not present

## 2017-11-29 DIAGNOSIS — K2100 Gastro-esophageal reflux disease with esophagitis, without bleeding: Secondary | ICD-10-CM

## 2017-11-29 MED ORDER — OMEPRAZOLE 20 MG PO CPDR: 20.0000 mg | DELAYED_RELEASE_CAPSULE | Freq: Every day | ORAL | 1 refills | Status: DC

## 2017-11-29 NOTE — Progress Notes (Signed)
Subjective:    Patient ID: Jeremy Kelley, male    DOB: [birth year], 84 y.o.   MRN: [id number]  HPI 84 year old male who  has a past medical history of Arthritis, Carotid artery stenosis (11/2008), Colon polyps, Diverticulosis, Hepatitis, Hyperlipidemia, Hypertension, Numbness, Phimosis, Prostate cancer ([place]) (2001), and Urothelial carcinoma of left distal ureter ([place]).  He presents the office today for multiple complaints.  1. Dysphagia -has been a chronic issue and was seen by GI in the past, appears most recently in 2016.  He reports that he feels as though his symptoms are becoming worse and he is often getting choked up on liquids and when he takes his medications.  Reports that he was at dinner a few nights ago got choked up on a piece of meat.  He vomiting during these incidents.  He would like to return to [place] for evaluation   2.  Hoarse voice and constant drooling-reports that over the last few months he feels as though his voice is been getting progressively more hoarse and less powerful.  He also endorses a constant "drooling" and finds that his pillow is often "soaked" and drool when he wakes up in the morning.  Symptoms are more severe when he is laying down at night.  He does endorse feeling as though he has burning sensation in his throat when he is laying down.  Denies waking up with a sour taste in his mouth.  He has tried Tax adviser for symptom relief but does not feel as though this worked at all  Review of Systems See HPI   Past Medical History:  Diagnosis Date  . Arthritis   . Carotid artery stenosis 11/2008   bilateral 40-59% stenosis  . Colon polyps   . Diverticulosis   . Hepatitis    doesn't know which type 1969  . Hyperlipidemia   . Hypertension   . Numbness    fingertips  . Phimosis   . Prostate cancer ([place]) 2001  . Urothelial carcinoma of left distal ureter [facility])     Social History   Socioeconomic History  . Marital status: Single   Spouse name: Not on file  . Number of children: 0  . Years of education: Not on file  . Highest education level: Not on file  Occupational History  . Occupation: Retired  Scientific laboratory technician  . Financial resource strain: Not on file  . Food insecurity:    Worry: Not on file    Inability: Not on file  . Transportation needs:    Medical: Not on file    Non-medical: Not on file  Tobacco Use  . Smoking status: Former Research scientist (life sciences)  . Smokeless tobacco: Never Used  . Tobacco comment: 07/29/2015 "might smoke a cigar once/year"  Substance and Sexual Activity  . Alcohol use: Yes    Alcohol/week: 14.0 standard drinks    Types: 14 Glasses of wine per week    Comment: 07/29/2015 "big glass of wine q night"  . Drug use: No  . Sexual activity: Not on file  Lifestyle  . Physical activity:    Days per week: Not on file    Minutes per session: Not on file  . Stress: Not on file  Relationships  . Social connections:    Talks on phone: Not on file    Gets together: Not on file    Attends religious service: Not on file    Active member of club or organization: Not on  file    Attends meetings of clubs or organizations: Not on file    Relationship status: Not on file  . Intimate partner violence:    Fear of current or ex partner: Not on file    Emotionally abused: Not on file    Physically abused: Not on file    Forced sexual activity: Not on file  Other Topics Concern  . Not on file  Social History Narrative   Patient is single, has never been married.  Does not have any children.  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   He drinks approximately 3-4 glasses of wine per week.  No history of excessive alcohol use.  Occasionally smokes a cigar.   Spends winters in [place]         Past Surgical History:  Procedure Laterality Date  . BIOPSY  10/01/2011   Procedure: BIOPSY;  Surgeon: [name], MD;  [facility];  Service: Urology;;  biopsy of bladder tumor  . BLADDER SUSPENSION   08/18/10   [name]  . CARDIAC CATHETERIZATION N/A 07/29/2015   Procedure: Right/Left Heart Cath and Coronary Angiography;  Surgeon: Peter M Martinique, MD;  Location: [place] [facility];  Service: Cardiovascular;  Laterality: N/A;  . CATARACT EXTRACTION W/ INTRAOCULAR LENS  IMPLANT, BILATERAL Bilateral   . CYSTOSCOPY W/ RETROGRADES  01/27/2012   Procedure: CYSTOSCOPY WITH RETROGRADE PYELOGRAM;  Surgeon: [name], MD;  [facility];  Service: Urology;  Laterality: Left;  . CYSTOSCOPY WITH RETROGRADE PYELOGRAM, URETEROSCOPY AND STENT PLACEMENT Left 11/24/2012   Procedure: CYSTOSCOPY WITH left RETROGRADE PYELOGRAM, bladder washings, ureteroscopy;  Surgeon: [name], MD;  [facility];  Service: Urology;  Laterality: Left;  . CYSTOSCOPY WITH RETROGRADE PYELOGRAM, URETEROSCOPY AND STENT PLACEMENT Bilateral 11/19/2013   Procedure: CYSTOSCOPY WITH BILATERAL RETROGRADE PYELOGRAM,;  Surgeon: [name], MD;  [facility];  Service: Urology;  Laterality: Bilateral;  . EYE SURGERY  09/24/10   left eye. "Retina surgery"  . INGUINAL HERNIA REPAIR  1960  . PROSTATE SURGERY     s/p laparoscopic prostate surgery  . SKIN BIOPSY  05/20/2017   Superficial and nodular basal cell carcinoma (left temple)    basal cell carcinoma with sclerosis (right temple)  . SKIN CANCER EXCISION     a/p skin cancer right foot-non melanoma  . TEE WITHOUT CARDIOVERSION N/A 07/29/2015   Procedure: TRANSESOPHAGEAL ECHOCARDIOGRAM (TEE);  Surgeon: Pixie Casino, MD;  Location: [facility] ENDOSCOPY;  Service: Cardiovascular;  Laterality: N/A;  cath to follow  . TRANSURETHRAL RESECTION OF BLADDER TUMOR  10/01/2011   Procedure: TRANSURETHRAL RESECTION OF BLADDER TUMOR (TURBT);  Surgeon: [name], MD;  [facility];  Service: Urology;  Laterality: N/A;  . TRANSURETHRAL RESECTION OF BLADDER TUMOR N/A 11/19/2013   Procedure: TRANSURETHRAL RESECTION OF BLADDER TUMOR (TURBT);  Surgeon: [name], MD;  [facility];   Service: Urology;  Laterality: N/A;  . URETEROSCOPY  01/27/2012   Procedure: URETEROSCOPY;  Surgeon: [name], MD;  [facility];  Service: Urology;  Laterality: Left;  CYSTO, Left RETROGRADE PYELOGRPHY, LEFT URETEROSCOPY   . URINARY SPHINCTER IMPLANT    . VARICOSE VEIN SURGERY      Family History  Problem Relation Age of Onset  . Colon cancer Neg Hx   . Colon polyps Neg Hx     No Known Allergies  Current Outpatient Medications on File Prior to Visit  Medication Sig Dispense Refill  . B Complex-C (B-COMPLEX WITH VITAMIN C)  tablet Take 1 tablet by mouth daily. Reported on 07/24/2015 90 tablet 3  . cholecalciferol (VITAMIN D) 1000 units tablet Take 1 tablet (1,000 Units total) by mouth daily. 90 tablet 3  . fluticasone (FLONASE) 50 MCG/ACT nasal spray Place 2 sprays into both nostrils daily. 16 g 6  . methylcellulose (ARTIFICIAL TEARS) 1 % ophthalmic solution Place 1 drop into both eyes 2 (two) times daily.     . mirabegron ER (MYRBETRIQ) 50 MG TB24 tablet Take 1 tablet (50 mg total) by mouth daily. 90 tablet 3  . Multiple Vitamins-Minerals (PRESERVISION/LUTEIN PO) Take 1 tablet by mouth daily.    . potassium chloride (K-DUR,KLOR-CON) 10 MEQ tablet Take 10 mEq by mouth daily.    . Solifenacin Succinate (VESICARE PO) Take by mouth.    [name] UNABLE TO FIND diosmin    . apixaban (ELIQUIS) 2.5 MG TABS tablet Take 1 tablet (2.5 mg total) by mouth 2 (two) times daily. 302 tablet 0  . furosemide (LASIX) 40 MG tablet Take 1 tablet (40 mg total) by mouth daily. 90 tablet 1  . metoprolol succinate (TOPROL-XL) 25 MG 24 hr tablet Take 1 tablet (25 mg total) by mouth at bedtime. 90 tablet 1  . pravastatin (PRAVACHOL) 20 MG tablet Take 1 tablet (20 mg total) by mouth at bedtime. 90 tablet 1   No current facility-administered medications on file prior to visit.     BP (!) 118/56 (BP Location: Left Arm, Patient Position: Sitting, Cuff Size: Normal)   Pulse 68   Temp 97.7 F (36.5 C) (Oral)   Wt  124 lb 9.6 oz (56.5 kg)   SpO2 96%   BMI 17.88 kg/m       Objective:   Physical Exam  Constitutional: He is oriented to person, place, and time. He appears well-developed and well-nourished. No distress.  HENT:  Mouth/Throat: Uvula is midline, oropharynx is clear and moist and mucous membranes are normal.  Neck: Trachea normal, normal range of motion and full passive range of motion without pain. Neck supple. No thyroid mass present.  Cardiovascular: Normal rate, regular rhythm, normal heart sounds and intact distal pulses.  Pulmonary/Chest: Effort normal and breath sounds normal.  Abdominal: Soft. He exhibits no distension. There is no tenderness.  Neurological: He is alert and oriented to person, place, and time.  Skin: Skin is warm and dry. Capillary refill takes less than 2 seconds. He is not diaphoretic.  Psychiatric: He has a normal mood and affect. His behavior is normal. Judgment and thought content normal.  Nursing note and vitals reviewed.     Assessment & Plan:  1. Gastroesophageal reflux disease with esophagitis -Symptoms of increased drooling and vocal cord dysfunction possibly related to GERD-like symptoms.  We will trial him on Prilosec 20 mg daily.  Advised follow-up in 2 to 3 weeks if symptoms have not improved - omeprazole (PRILOSEC) 20 MG capsule; Take 1 capsule (20 mg total) by mouth daily.  Dispense: 30 capsule; Refill: 1  2. Dysphagia, unspecified type  - Ambulatory referral to Gastroenterology  [name], NP

## 2017-12-01 ENCOUNTER — Encounter: Payer: Self-pay | Admitting: Nurse Practitioner

## 2017-12-01 ENCOUNTER — Ambulatory Visit (INDEPENDENT_AMBULATORY_CARE_PROVIDER_SITE_OTHER): Payer: Medicare Other | Admitting: Nurse Practitioner

## 2017-12-01 VITALS — BP 138/60 | HR 80 | Ht 70.0 in | Wt 126.0 lb

## 2017-12-01 DIAGNOSIS — K224 Dyskinesia of esophagus: Secondary | ICD-10-CM

## 2017-12-01 DIAGNOSIS — Z7901 Long term (current) use of anticoagulants: Secondary | ICD-10-CM

## 2017-12-01 NOTE — Progress Notes (Addendum)
GI provider:   Previously [name], MD          Chief Complaint: drooling, build up of secretions in throat / mouth   Referring Provider:  [name], NP      ASSESSMENT AND PLAN;   53. 84 yo male with history of esophageal dysmotility / aspiration. He is here with complaints of a buildup of excessive saliva in throat and he is unable to adequately manage it. He drools large amounts of thick secretions. He denies dysphagia to solids or liquids though mentions chewing his food to "mush ' consistency.    -Will obtain barium swallow with tablet to evaluate for strictures but really sounds more like dysmotility consistent with his past hx of such.   -PCP prescribed omeprazole couple of days ago.  Patient has no heartburn, denies regurgitation. I told him it was okay to hold off on starting PPI until we had a better idea of what is going on with his swallowing.   2. Chronic Afib on Eliquis  Addendum: Reviewed and agree with initial management. Pyrtle, Lajuan Lines, MD    HPI:    Patient is an 84 year old male with hypertension, chronic A. fib on Eliquis, and pulmonary artery hypertension. He is here to talk about drooling problems and frequent regurgitation / expectoration of thick fluid. He chews food to "mush" before swallowing but can't explain why he started that habit. He doesn't recall ever having solid food dysphagia. No difficulty swallowing liquids and he isn't coughing during meals. His main, and really only concern is difficulty in managing the fluid secretions. He has excessive drooling at night and also friends now noticing when out to dinner.   No heartburn or regurgitation. He complains of a squeaky voice.Marland KitchenHis weight is stable. Labs in mid July -  Normal CBC , Creatinine mildly elevated at 1.34  Past Medical History:  Diagnosis Date  . Arthritis   . Carotid artery stenosis 11/2008   bilateral 40-59% stenosis  . Colon polyps   . Diverticulosis   . Hepatitis    doesn't  know which type 1969  . Hyperlipidemia   . Hypertension   . Numbness    fingertips  . Phimosis   . Prostate cancer ([place]) 2001  . Urothelial carcinoma of left distal ureter [facility])      Past Surgical History:  Procedure Laterality Date  . BIOPSY  10/01/2011   Procedure: BIOPSY;  Surgeon: [name], MD;  [facility];  Service: Urology;;  biopsy of bladder tumor  . BLADDER SUSPENSION  08/18/10   [name]  . CARDIAC CATHETERIZATION N/A 07/29/2015   Procedure: Right/Left Heart Cath and Coronary Angiography;  Surgeon: Peter M Martinique, MD;  Location: [place] [facility];  Service: Cardiovascular;  Laterality: N/A;  . CATARACT EXTRACTION W/ INTRAOCULAR LENS  IMPLANT, BILATERAL Bilateral   . CYSTOSCOPY W/ RETROGRADES  01/27/2012   Procedure: CYSTOSCOPY WITH RETROGRADE PYELOGRAM;  Surgeon: [name], MD;  [facility];  Service: Urology;  Laterality: Left;  . CYSTOSCOPY WITH RETROGRADE PYELOGRAM, URETEROSCOPY AND STENT PLACEMENT Left 11/24/2012   Procedure: CYSTOSCOPY WITH left RETROGRADE PYELOGRAM, bladder washings, ureteroscopy;  Surgeon: [name], MD;  [facility];  Service: Urology;  Laterality: Left;  . CYSTOSCOPY WITH RETROGRADE PYELOGRAM, URETEROSCOPY AND STENT PLACEMENT Bilateral 11/19/2013   Procedure: CYSTOSCOPY WITH BILATERAL RETROGRADE PYELOGRAM,;  Surgeon: [name], MD;  [facility];  Service: Urology;  Laterality: Bilateral;  . EYE SURGERY  09/24/10   left  eye. "Retina surgery"  . INGUINAL HERNIA REPAIR  1960  . PROSTATE SURGERY     s/p laparoscopic prostate surgery  . SKIN BIOPSY  05/20/2017   Superficial and nodular basal cell carcinoma (left temple)    basal cell carcinoma with sclerosis (right temple)  . SKIN CANCER EXCISION     a/p skin cancer right foot-non melanoma  . skin laceration    . TEE WITHOUT CARDIOVERSION N/A 07/29/2015   Procedure: TRANSESOPHAGEAL ECHOCARDIOGRAM (TEE);  Surgeon: Pixie Casino, MD;  Location: [facility];  Service:  Cardiovascular;  Laterality: N/A;  cath to follow  . TRANSURETHRAL RESECTION OF BLADDER TUMOR  10/01/2011   Procedure: TRANSURETHRAL RESECTION OF BLADDER TUMOR (TURBT);  Surgeon: [name], MD;  [facility];  Service: Urology;  Laterality: N/A;  . TRANSURETHRAL RESECTION OF BLADDER TUMOR N/A 11/19/2013   Procedure: TRANSURETHRAL RESECTION OF BLADDER TUMOR (TURBT);  Surgeon: [name], MD;  [facility];  Service: Urology;  Laterality: N/A;  . URETEROSCOPY  01/27/2012   Procedure: URETEROSCOPY;  Surgeon: [name], MD;  [facility];  Service: Urology;  Laterality: Left;  CYSTO, Left RETROGRADE PYELOGRPHY, LEFT URETEROSCOPY   . URINARY SPHINCTER IMPLANT    . VARICOSE VEIN SURGERY     Family History  Problem Relation Age of Onset  . Colon cancer Neg Hx   . Colon polyps Neg Hx    Social History   Tobacco Use  . Smoking status: Former Research scientist (life sciences)  . Smokeless tobacco: Never Used  . Tobacco comment: 07/29/2015 "might smoke a cigar once/year"  Substance Use Topics  . Alcohol use: Yes    Alcohol/week: 14.0 standard drinks    Types: 14 Glasses of wine per week    Comment: 07/29/2015 "big glass of wine q night"  . Drug use: No   Current Outpatient Medications  Medication Sig Dispense Refill  . apixaban (ELIQUIS) 2.5 MG TABS tablet Take 2.5 mg by mouth 2 (two) times daily.    . B Complex-C (B-COMPLEX WITH VITAMIN C) tablet Take 1 tablet by mouth daily. Reported on 07/24/2015 90 tablet 3  . cholecalciferol (VITAMIN D) 1000 units tablet Take 1 tablet (1,000 Units total) by mouth daily. 90 tablet 3  . furosemide (LASIX) 40 MG tablet Take 40 mg by mouth.    . methylcellulose (ARTIFICIAL TEARS) 1 % ophthalmic solution Place 1 drop into both eyes 2 (two) times daily.     . mirabegron ER (MYRBETRIQ) 50 MG TB24 tablet Take 1 tablet (50 mg total) by mouth daily. 90 tablet 3  . Multiple Vitamins-Minerals (PRESERVISION/LUTEIN PO) Take 1 tablet by mouth daily.    [name] omeprazole (PRILOSEC) 20  MG capsule Take 1 capsule (20 mg total) by mouth daily. 30 capsule 1  . potassium chloride (K-DUR,KLOR-CON) 10 MEQ tablet Take 10 mEq by mouth daily.    [name] UNABLE TO FIND diosmin    . apixaban (ELIQUIS) 2.5 MG TABS tablet Take 1 tablet (2.5 mg total) by mouth 2 (two) times daily. 302 tablet 0  . furosemide (LASIX) 40 MG tablet Take 1 tablet (40 mg total) by mouth daily. 90 tablet 1  . metoprolol succinate (TOPROL-XL) 25 MG 24 hr tablet Take 1 tablet (25 mg total) by mouth at bedtime. 90 tablet 1  . pravastatin (PRAVACHOL) 20 MG tablet Take 1 tablet (20 mg total) by mouth at bedtime. 90 tablet 1   No current facility-administered medications for this visit.    No Known Allergies   Review of  Systems: All systems reviewed and negative except where noted in HPI.   Creatinine clearance cannot be calculated (Patient's most recent lab result is older than the maximum 21 days allowed.)   Physical Exam:    Wt Readings from Last 3 Encounters:  12/01/17 126 lb (57.2 kg)  11/29/17 124 lb 9.6 oz (56.5 kg)  11/11/17 126 lb (57.2 kg)    BP 138/60   Pulse 80   Ht 5\' 10"  (1.778 m)   Wt 126 lb (57.2 kg)   BMI 18.08 kg/m  Constitutional:  Pleasant thin male in no acute distress. Psychiatric: Normal mood and affect. Behavior is normal. EENT: Pupils normal.  Conjunctivae are normal. No scleral icterus. Neck supple.  Cardiovascular: Normal rate, regular rhythm. No edema Pulmonary/chest: Effort normal and breath sounds normal. No wheezing, rales or rhonchi. Abdominal: Soft, nondistended, nontender. Bowel sounds active throughout. There are no masses palpable. No hepatomegaly. Neurological: Alert and oriented to person place and time. Skin: Skin is warm and dry. No rashes noted.  Tye Savoy, NP  12/01/2017, 2:26 PM  Cc:  [name], NP

## 2017-12-01 NOTE — Patient Instructions (Signed)
If you are age 82 or older, your body mass index should be between 23-30. Your Body mass index is 18.08 kg/m. If this is out of the aforementioned range listed, please consider follow up with your Primary Care Provider.  If you are age 65 or younger, your body mass index should be between 19-25. Your Body mass index is 18.08 kg/m. If this is out of the aformentioned range listed, please consider follow up with your Primary Care Provider.   You have been scheduled for a Barium Esophogram at [facility] (1st floor of the hospital) on 12/09/27 at 10:30 am. Please arrive 15 minutes prior to your appointment for registration. Make certain not to have anything to eat or drink 3 hours prior to your test. If you need to reschedule for any reason, please contact radiology at [phone] to do so. __________________________________________________________________ A barium swallow is an examination that concentrates on views of the esophagus. This tends to be a double contrast exam (barium and two liquids which, when combined, create a gas to distend the wall of the oesophagus) or single contrast (non-ionic iodine based). The study is usually tailored to your symptoms so a good history is essential. Attention is paid during the study to the form, structure and configuration of the esophagus, looking for functional disorders (such as aspiration, dysphagia, achalasia, motility and reflux) EXAMINATION You may be asked to change into a gown, depending on the type of swallow being performed. A radiologist and radiographer will perform the procedure. The radiologist will advise you of the type of contrast selected for your procedure and direct you during the exam. You will be asked to stand, sit or lie in several different positions and to hold a small amount of fluid in your mouth before being asked to swallow while the imaging is performed .In some instances you may be asked to swallow barium coated  marshmallows to assess the motility of a solid food bolus. The exam can be recorded as a digital or video fluoroscopy procedure. POST PROCEDURE It will take 1-2 days for the barium to pass through your system. To facilitate this, it is important, unless otherwise directed, to increase your fluids for the next 24-48hrs and to resume your normal diet.  This test typically takes about 30 minutes to perform. _____________________________________________________________________________  Will call you with results.  Thank you for choosing me and [facility].   Tye Savoy, NP

## 2017-12-02 DIAGNOSIS — H04123 Dry eye syndrome of bilateral lacrimal glands: Secondary | ICD-10-CM | POA: Diagnosis not present

## 2017-12-05 ENCOUNTER — Encounter: Payer: Self-pay | Admitting: Nurse Practitioner

## 2017-12-05 DIAGNOSIS — Z5189 Encounter for other specified aftercare: Secondary | ICD-10-CM | POA: Diagnosis not present

## 2017-12-08 ENCOUNTER — Ambulatory Visit (INDEPENDENT_AMBULATORY_CARE_PROVIDER_SITE_OTHER): Payer: Medicare Other | Admitting: Cardiovascular Disease

## 2017-12-08 ENCOUNTER — Encounter: Payer: Self-pay | Admitting: Cardiovascular Disease

## 2017-12-08 ENCOUNTER — Ambulatory Visit (HOSPITAL_COMMUNITY)
Admission: RE | Admit: 2017-12-08 | Discharge: 2017-12-08 | Disposition: A | Discharge status: Home / Self Care | Payer: Medicare Other | Source: Ambulatory Visit | Attending: Nurse Practitioner | Admitting: Nurse Practitioner

## 2017-12-08 DIAGNOSIS — R1110 Vomiting, unspecified: Secondary | ICD-10-CM | POA: Diagnosis not present

## 2017-12-08 DIAGNOSIS — R1310 Dysphagia, unspecified: Secondary | ICD-10-CM | POA: Diagnosis not present

## 2017-12-08 DIAGNOSIS — I10 Essential (primary) hypertension: Secondary | ICD-10-CM | POA: Diagnosis not present

## 2017-12-08 DIAGNOSIS — I482 Chronic atrial fibrillation, unspecified: Secondary | ICD-10-CM

## 2017-12-08 DIAGNOSIS — Z760 Encounter for issue of repeat prescription: Secondary | ICD-10-CM | POA: Diagnosis not present

## 2017-12-08 DIAGNOSIS — I2510 Atherosclerotic heart disease of native coronary artery without angina pectoris: Secondary | ICD-10-CM | POA: Diagnosis not present

## 2017-12-08 DIAGNOSIS — I351 Nonrheumatic aortic (valve) insufficiency: Secondary | ICD-10-CM

## 2017-12-08 DIAGNOSIS — E785 Hyperlipidemia, unspecified: Secondary | ICD-10-CM

## 2017-12-08 DIAGNOSIS — K224 Dyskinesia of esophagus: Secondary | ICD-10-CM | POA: Diagnosis not present

## 2017-12-08 MED ORDER — APIXABAN 2.5 MG PO TABS: 2.5000 mg | ORAL_TABLET | Freq: Two times a day (BID) | ORAL | 3 refills | Status: DC

## 2017-12-08 MED ORDER — PRAVASTATIN SODIUM 20 MG PO TABS: 20.0000 mg | ORAL_TABLET | Freq: Every day | ORAL | 3 refills | Status: DC

## 2017-12-08 MED ORDER — METOPROLOL SUCCINATE ER 25 MG PO TB24: 25.0000 mg | ORAL_TABLET | Freq: Every day | ORAL | 3 refills | Status: DC

## 2017-12-08 MED ORDER — FUROSEMIDE 40 MG PO TABS: 40.0000 mg | ORAL_TABLET | Freq: Every day | ORAL | 3 refills | Status: DC

## 2017-12-08 MED ORDER — POTASSIUM CHLORIDE CRYS ER 10 MEQ PO TBCR: 10.0000 meq | EXTENDED_RELEASE_TABLET | Freq: Every day | ORAL | 3 refills | Status: DC

## 2017-12-08 NOTE — Patient Instructions (Signed)
Medication Instructions:  Your physician recommends that you continue on your current medications as directed. Please refer to the Current Medication list given to you today.   Labwork: None Ordered   Testing/Procedures: None Ordered   Follow-Up: Your physician recommends that you schedule a follow-up appointment in: 4 months with Dr. Acie Fredrickson   If you need a refill on your cardiac medications before your next appointment, please call your pharmacy.   Thank you for choosing [facility]! Christen Bame, RN [phone]
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
## 2017-12-13 ENCOUNTER — Ambulatory Visit (INDEPENDENT_AMBULATORY_CARE_PROVIDER_SITE_OTHER): Payer: Medicare Other | Admitting: Adult Health

## 2017-12-13 ENCOUNTER — Encounter: Payer: Self-pay | Admitting: Adult Health

## 2017-12-13 VITALS — BP 100/50 | Temp 98.4°F | Wt 125.0 lb

## 2017-12-13 DIAGNOSIS — I25119 Atherosclerotic heart disease of native coronary artery with unspecified angina pectoris: Secondary | ICD-10-CM | POA: Diagnosis not present

## 2017-12-13 DIAGNOSIS — I10 Essential (primary) hypertension: Secondary | ICD-10-CM | POA: Diagnosis not present

## 2017-12-13 DIAGNOSIS — I272 Pulmonary hypertension, unspecified: Secondary | ICD-10-CM

## 2017-12-13 DIAGNOSIS — I482 Chronic atrial fibrillation, unspecified: Secondary | ICD-10-CM

## 2017-12-13 NOTE — Progress Notes (Signed)
Subjective:    Patient ID: Jeremy Kelley, male    DOB: [birth year], 84 y.o.   MRN: [id number]  HPI 84 year old male who  has a past medical history of Arthritis, Carotid artery stenosis (11/2008), Colon polyps, Diverticulosis, Hepatitis, Hyperlipidemia, Hypertension, Numbness, Phimosis, Prostate cancer ([place]) (2001), and Urothelial carcinoma of left distal ureter ([place]).  He presents to the office today for follow-up.  He had a barium swallow done that revealed moderate silent aspiration with initial swallows of barium.  No mass or stricture seen.  He has not been followed up with by GI as of today.  He plans on making appointment before he leaves for [place].  Spends most of his winter in [place].  Will need his meds refilled until he can get back to the [place] in February.  Review of Systems See HPI   Past Medical History:  Diagnosis Date  . Arthritis   . Carotid artery stenosis 11/2008   bilateral 40-59% stenosis  . Colon polyps   . Diverticulosis   . Hepatitis    doesn't know which type 1969  . Hyperlipidemia   . Hypertension   . Numbness    fingertips  . Phimosis   . Prostate cancer ([facility]) 2001  . Urothelial carcinoma of left distal ureter [facility])     Social History   Socioeconomic History  . Marital status: Single    Spouse name: Not on file  . Number of children: 0  . Years of education: Not on file  . Highest education level: Not on file  Occupational History  . Occupation: Retired  Scientific laboratory technician  . Financial resource strain: Not on file  . Food insecurity:    Worry: Not on file    Inability: Not on file  . Transportation needs:    Medical: Not on file    Non-medical: Not on file  Tobacco Use  . Smoking status: Former Research scientist (life sciences)  . Smokeless tobacco: Never Used  . Tobacco comment: 07/29/2015 "might smoke a cigar once/year"  Substance and Sexual Activity  . Alcohol use: Yes    Alcohol/week: 14.0 standard drinks    Types: 14 Glasses of wine per week   Comment: 07/29/2015 "big glass of wine q night"  . Drug use: No  . Sexual activity: Not on file  Lifestyle  . Physical activity:    Days per week: Not on file    Minutes per session: Not on file  . Stress: Not on file  Relationships  . Social connections:    Talks on phone: Not on file    Gets together: Not on file    Attends religious service: Not on file    Active member of club or organization: Not on file    Attends meetings of clubs or organizations: Not on file    Relationship status: Not on file  . Intimate partner violence:    Fear of current or ex partner: Not on file    Emotionally abused: Not on file    Physically abused: Not on file    Forced sexual activity: Not on file  Other Topics Concern  . Not on file  Social History Narrative   Patient is single, has never been married.  Does not have any children.  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   He drinks approximately 3-4 glasses of wine per week.  No history of excessive alcohol use.  Occasionally smokes a cigar.   Spends winters in  [place]         Past Surgical History:  Procedure Laterality Date  . BIOPSY  10/01/2011   Procedure: BIOPSY;  Surgeon: [name], MD;  [facility];  Service: Urology;;  biopsy of bladder tumor  . BLADDER SUSPENSION  08/18/10   [name]  . CARDIAC CATHETERIZATION N/A 07/29/2015   Procedure: Right/Left Heart Cath and Coronary Angiography;  Surgeon: Peter M Martinique, MD;  Location: [place] [facility];  Service: Cardiovascular;  Laterality: N/A;  . CATARACT EXTRACTION W/ INTRAOCULAR LENS  IMPLANT, BILATERAL Bilateral   . CYSTOSCOPY W/ RETROGRADES  01/27/2012   Procedure: CYSTOSCOPY WITH RETROGRADE PYELOGRAM;  Surgeon: [name], MD;  [facility];  Service: Urology;  Laterality: Left;  . CYSTOSCOPY WITH RETROGRADE PYELOGRAM, URETEROSCOPY AND STENT PLACEMENT Left 11/24/2012   Procedure: CYSTOSCOPY WITH left RETROGRADE PYELOGRAM, bladder washings, ureteroscopy;   Surgeon: [name], MD;  [facility];  Service: Urology;  Laterality: Left;  . CYSTOSCOPY WITH RETROGRADE PYELOGRAM, URETEROSCOPY AND STENT PLACEMENT Bilateral 11/19/2013   Procedure: CYSTOSCOPY WITH BILATERAL RETROGRADE PYELOGRAM,;  Surgeon: [name], MD;  [facility];  Service: Urology;  Laterality: Bilateral;  . EYE SURGERY  09/24/10   left eye. "Retina surgery"  . INGUINAL HERNIA REPAIR  1960  . PROSTATE SURGERY     s/p laparoscopic prostate surgery  . SKIN BIOPSY  05/20/2017   Superficial and nodular basal cell carcinoma (left temple)    basal cell carcinoma with sclerosis (right temple)  . SKIN CANCER EXCISION     a/p skin cancer right foot-non melanoma  . skin laceration    . TEE WITHOUT CARDIOVERSION N/A 07/29/2015   Procedure: TRANSESOPHAGEAL ECHOCARDIOGRAM (TEE);  Surgeon: Pixie Casino, MD;  Location: [facility]COPY;  Service: Cardiovascular;  Laterality: N/A;  cath to follow  . TRANSURETHRAL RESECTION OF BLADDER TUMOR  10/01/2011   Procedure: TRANSURETHRAL RESECTION OF BLADDER TUMOR (TURBT);  Surgeon: [name], MD;  [facility];  Service: Urology;  Laterality: N/A;  . TRANSURETHRAL RESECTION OF BLADDER TUMOR N/A 11/19/2013   Procedure: TRANSURETHRAL RESECTION OF BLADDER TUMOR (TURBT);  Surgeon: [name], MD;  [facility];  Service: Urology;  Laterality: N/A;  . URETEROSCOPY  01/27/2012   Procedure: URETEROSCOPY;  Surgeon: [name], MD;  [facility];  Service: Urology;  Laterality: Left;  CYSTO, Left RETROGRADE PYELOGRPHY, LEFT URETEROSCOPY   . URINARY SPHINCTER IMPLANT    . VARICOSE VEIN SURGERY      Family History  Problem Relation Age of Onset  . Colon cancer Neg Hx   . Colon polyps Neg Hx     No Known Allergies  Current Outpatient Medications on File Prior to Visit  Medication Sig Dispense Refill  . apixaban (ELIQUIS) 2.5 MG TABS tablet Take 1 tablet (2.5 mg total) by mouth 2 (two) times daily. 180 tablet 3  . B Complex-C  (B-COMPLEX WITH VITAMIN C) tablet Take 1 tablet by mouth daily. Reported on 07/24/2015 90 tablet 3  . cholecalciferol (VITAMIN D) 1000 units tablet Take 1 tablet (1,000 Units total) by mouth daily. 90 tablet 3  . furosemide (LASIX) 40 MG tablet Take 1 tablet (40 mg total) by mouth daily. 90 tablet 3  . methylcellulose (ARTIFICIAL TEARS) 1 % ophthalmic solution Place 1 drop into both eyes 2 (two) times daily.     . metoprolol succinate (TOPROL-XL) 25 MG 24 hr tablet Take 1 tablet (25 mg total) by mouth at bedtime. 90 tablet 3  . mirabegron  ER (MYRBETRIQ) 50 MG TB24 tablet Take 1 tablet (50 mg total) by mouth daily. 90 tablet 3  . Multiple Vitamins-Minerals (PRESERVISION/LUTEIN PO) Take 1 tablet by mouth daily.    [name] omeprazole (PRILOSEC) 20 MG capsule Take 1 capsule (20 mg total) by mouth daily. 30 capsule 1  . potassium chloride (K-DUR,KLOR-CON) 10 MEQ tablet Take 1 tablet (10 mEq total) by mouth daily. 90 tablet 3  . pravastatin (PRAVACHOL) 20 MG tablet Take 1 tablet (20 mg total) by mouth at bedtime. 90 tablet 3  . UNABLE TO FIND diosmin     No current facility-administered medications on file prior to visit.     BP (!) 100/50   Temp 98.4 F (36.9 C)   Wt 125 lb (56.7 kg)   BMI 17.94 kg/m       Objective:   Physical Exam  Constitutional: He is oriented to person, place, and time. He appears well-developed and well-nourished. No distress.  Eyes: Pupils are equal, round, and reactive to light. EOM are normal.  Cardiovascular: Normal rate and intact distal pulses. An irregularly irregular rhythm present.  Murmur heard. Pulmonary/Chest: Effort normal and breath sounds normal. No stridor. No respiratory distress. He has no wheezes. He has no rales. He exhibits no tenderness.  Lymphadenopathy:    He has no cervical adenopathy.  Neurological: He is alert and oriented to person, place, and time.  Skin: Skin is warm and dry. Capillary refill takes less than 2 seconds. He is not diaphoretic.    Psychiatric: He has a normal mood and affect. His behavior is normal. Judgment and thought content normal.  Nursing note and vitals reviewed.     Assessment & Plan:  I will call his insurance company and get an additional supply of medication sent to him for his annual migration to [place]  Kassi Esteve, NP

## 2017-12-15 ENCOUNTER — Other Ambulatory Visit: Payer: Self-pay | Admitting: Adult Health

## 2017-12-15 DIAGNOSIS — I10 Essential (primary) hypertension: Secondary | ICD-10-CM

## 2017-12-15 DIAGNOSIS — R32 Unspecified urinary incontinence: Secondary | ICD-10-CM

## 2017-12-15 DIAGNOSIS — N481 Balanitis: Secondary | ICD-10-CM | POA: Diagnosis not present

## 2017-12-15 DIAGNOSIS — K2100 Gastro-esophageal reflux disease with esophagitis, without bleeding: Secondary | ICD-10-CM

## 2017-12-15 DIAGNOSIS — Z760 Encounter for issue of repeat prescription: Secondary | ICD-10-CM

## 2017-12-15 DIAGNOSIS — E785 Hyperlipidemia, unspecified: Secondary | ICD-10-CM

## 2017-12-15 DIAGNOSIS — I482 Chronic atrial fibrillation, unspecified: Secondary | ICD-10-CM

## 2017-12-15 MED ORDER — OMEPRAZOLE 20 MG PO CPDR: 20.0000 mg | DELAYED_RELEASE_CAPSULE | Freq: Every day | ORAL | 1 refills | Status: DC

## 2017-12-15 MED ORDER — PRAVASTATIN SODIUM 20 MG PO TABS: 20.0000 mg | ORAL_TABLET | Freq: Every day | ORAL | 0 refills | Status: DC

## 2017-12-15 MED ORDER — FUROSEMIDE 40 MG PO TABS: 40.0000 mg | ORAL_TABLET | Freq: Every day | ORAL | 3 refills | Status: DC

## 2017-12-15 MED ORDER — APIXABAN 2.5 MG PO TABS: 2.5000 mg | ORAL_TABLET | Freq: Two times a day (BID) | ORAL | 0 refills | Status: DC

## 2017-12-15 MED ORDER — MIRABEGRON ER 50 MG PO TB24: 50.0000 mg | ORAL_TABLET | Freq: Every day | ORAL | 0 refills | Status: DC

## 2017-12-15 MED ORDER — METOPROLOL SUCCINATE ER 25 MG PO TB24: 25.0000 mg | ORAL_TABLET | Freq: Every day | ORAL | 0 refills | Status: DC

## 2017-12-15 MED ORDER — POTASSIUM CHLORIDE CRYS ER 10 MEQ PO TBCR: 10.0000 meq | EXTENDED_RELEASE_TABLET | Freq: Every day | ORAL | 0 refills | Status: DC

## 2017-12-21 ENCOUNTER — Encounter: Payer: Self-pay | Admitting: Nurse Practitioner

## 2017-12-21 ENCOUNTER — Ambulatory Visit (INDEPENDENT_AMBULATORY_CARE_PROVIDER_SITE_OTHER): Payer: Medicare Other | Admitting: Nurse Practitioner

## 2017-12-21 VITALS — BP 134/70 | HR 80 | Ht 66.5 in | Wt 122.2 lb

## 2017-12-21 DIAGNOSIS — R1312 Dysphagia, oropharyngeal phase: Secondary | ICD-10-CM

## 2017-12-21 DIAGNOSIS — I25119 Atherosclerotic heart disease of native coronary artery with unspecified angina pectoris: Secondary | ICD-10-CM

## 2017-12-21 NOTE — Progress Notes (Addendum)
Primary GI:   Zenovia Jarred, MD  Chief Complaint:    Follow up on swallowing problems.   IMPRESSION and PLAN:     84 year old male with Afib, on Eliquis. He has chronic laryngopharyngeal reflux who I saw a few weeks ago with regurgitation / drooling, thick esophageal secretions which at times can be troublesome to manage.  Barium swallow remarkable for mild stasis of barium but tablet easily passed from the esophagus into the stomach.  I suspect he has esophageal dysmotility.  Additionally, there was moderate silent aspiration with initial swallows.  -Patient had similar findings esophagram a few years back.  Per office notes patient was given some compensatory swallowing maneuvers such as chin tucks but he doesn't recall and certainly isn't utilizing any of those now.  -We discussed referral to SLP but patient is leaving for [place] on Sunday where he will be for the next 3 to 4 months.  I have given him a copy of the esophagram as he is interested in seeing a Astronomer in [place].  -Upon return to the [place] patient will contact us to get a referral to SLP for initial, or possibly continuation of services depending on symptoms  Addendum: Reviewed and agree with management of esophageal dysmotility and concern for silent aspiration. Jerene Bears, MD       HPI:     Patient is an 84 year old male who I saw a few weeks ago for evaluation of drooling, expectoration of thick fluids and regurgitation.  He was followed years ago by Dr. Olevia Perches for esophageal dysmotility and laryngopharyngeal reflux.   When I saw the patient in clinic he was concerned about frequent drooling of thick secretions.  He felt like there was always thick fluid in his throat which was difficult to swallow.  He had no complaints of dysphagia.    I ordered a barium swallow with tablet, . The tablet passed without difficulty.   IMPRESSION: Moderate silent aspiration with initial barium swallows. Patient  may benefit from re-evaluation with speech therapy and modified barium swallow. Patulous esophagus with mild esophageal stasis but no mass or stricture.   Patient is here for follow-up.  Patient has property in [place] where he spends several months out of the year.  He is leaving to go there on Sunday .  He has no new complaints.  We talked about the results of the barium swallow.    Review of systems:     No chest pain, no SOB, no fevers, no urinary sx   Past Medical History:  Diagnosis Date  . Arthritis   . Carotid artery stenosis 11/2008   bilateral 40-59% stenosis  . Colon polyps   . Diverticulosis   . Hepatitis    doesn't know which type 1969  . Hyperlipidemia   . Hypertension   . Numbness    fingertips  . Phimosis   . Prostate cancer ([place]) 2001  . Urothelial carcinoma of left distal ureter ([facility])     Patient's surgical history, family medical history, social history, medications and allergies were all reviewed in Epic   Creatinine clearance cannot be calculated (Patient's most recent lab result is older than the maximum 21 days allowed.)  Current Outpatient Medications  Medication Sig Dispense Refill  . apixaban (ELIQUIS) 2.5 MG TABS tablet Take 1 tablet (2.5 mg total) by mouth 2 (two) times daily. 180 tablet 0  . B Complex-C (B-COMPLEX WITH VITAMIN C) tablet Take 1 tablet by mouth daily. Reported on  07/24/2015 90 tablet 3  . cholecalciferol (VITAMIN D) 1000 units tablet Take 1 tablet (1,000 Units total) by mouth daily. 90 tablet 3  . furosemide (LASIX) 40 MG tablet Take 1 tablet (40 mg total) by mouth daily. 90 tablet 3  . methylcellulose (ARTIFICIAL TEARS) 1 % ophthalmic solution Place 1 drop into both eyes 2 (two) times daily.     . metoprolol succinate (TOPROL-XL) 25 MG 24 hr tablet Take 1 tablet (25 mg total) by mouth at bedtime. 90 tablet 0  . mirabegron ER (MYRBETRIQ) 50 MG TB24 tablet Take 1 tablet (50 mg total) by mouth daily. 90 tablet 0  . Multiple  Vitamins-Minerals (PRESERVISION/LUTEIN PO) Take 1 tablet by mouth daily.    [name] omeprazole (PRILOSEC) 20 MG capsule Take 1 capsule (20 mg total) by mouth daily. 30 capsule 1  . potassium chloride (K-DUR,KLOR-CON) 10 MEQ tablet Take 1 tablet (10 mEq total) by mouth daily. 90 tablet 0  . pravastatin (PRAVACHOL) 20 MG tablet Take 1 tablet (20 mg total) by mouth at bedtime. 90 tablet 0  . UNABLE TO FIND diosmin     No current facility-administered medications for this visit.     Physical Exam:     BP 134/70 (BP Location: Left Arm, Patient Position: Sitting, Cuff Size: Normal)   Pulse 80   Ht 5' 6.5" (1.689 m) Comment: height measured without shoes  Wt 122 lb 4 oz (55.5 kg)   BMI 19.44 kg/m   GENERAL:  Pleasant thin male in NAD PSYCH: : Cooperative, normal affect PULM: Normal respiratory effort, lungs CTA bilaterally, no wheezing NEURO: Alert and oriented x 3, no focal neurologic deficits   Tye Savoy , NP 12/21/2017, 3:05 PM

## 2017-12-21 NOTE — Patient Instructions (Signed)
If you are age 82 or older, your body mass index should be between 23-30. Your Body mass index is 19.44 kg/m. If this is out of the aforementioned range listed, please consider follow up with your Primary Care Provider.  If you are age 83 or younger, your body mass index should be between 19-25. Your Body mass index is 19.44 kg/m. If this is out of the aformentioned range listed, please consider follow up with your Primary Care Provider.   You have been given a copy of Barium Esophagram.  Please call us when you get back from [place].  Thank you for choosing me and [facility].   Tye Savoy, NP

## 2017-12-22 ENCOUNTER — Ambulatory Visit (INDEPENDENT_AMBULATORY_CARE_PROVIDER_SITE_OTHER): Payer: Medicare Other | Admitting: Adult Health

## 2017-12-22 ENCOUNTER — Encounter: Payer: Self-pay | Admitting: Adult Health

## 2017-12-22 VITALS — BP 118/58 | Temp 98.1°F | Wt 122.0 lb

## 2017-12-22 DIAGNOSIS — I25119 Atherosclerotic heart disease of native coronary artery with unspecified angina pectoris: Secondary | ICD-10-CM | POA: Diagnosis not present

## 2017-12-22 DIAGNOSIS — R5383 Other fatigue: Secondary | ICD-10-CM | POA: Diagnosis not present

## 2017-12-22 NOTE — Progress Notes (Signed)
Subjective:    Patient ID: Jeremy Kelley, male    DOB: 1926-08-01, 84 y.o.   MRN: [id number]  HPI  84 year old male who  has a past medical history of Arthritis, Carotid artery stenosis (11/2008), Colon polyps, Diverticulosis, Hepatitis, Hyperlipidemia, Hypertension, Numbness, Phimosis, Prostate cancer ([place]) (2001), and Urothelial carcinoma of left distal ureter ([place]).  Presents to the office today for follow-up prior to leaving for [place] for the winter.  He was seen yesterday by GI for swallowing difficulty in the suspect esophageal dysmotility.  It was suggested for him to see SLP, he is going to look for one while is in [place].  Overall he feels pretty good but is feeling more fatigued as of lately.  Reports that he has not been eating as much due to the swallowing issue. Wt Readings from Last 3 Encounters:  12/22/17 122 lb (55.3 kg)  12/21/17 122 lb 4 oz (55.5 kg)  12/13/17 125 lb (56.7 kg)     Review of Systems See HPI   Past Medical History:  Diagnosis Date  . Arthritis   . Carotid artery stenosis 11/2008   bilateral 40-59% stenosis  . Colon polyps   . Diverticulosis   . Hepatitis    doesn't know which type 1969  . Hyperlipidemia   . Hypertension   . Numbness    fingertips  . Phimosis   . Prostate cancer ([place]) 2001  . Urothelial carcinoma of left distal ureter [facility])     Social History   Socioeconomic History  . Marital status: Single    Spouse name: Not on file  . Number of children: 0  . Years of education: Not on file  . Highest education level: Not on file  Occupational History  . Occupation: Retired  Scientific laboratory technician  . Financial resource strain: Not on file  . Food insecurity:    Worry: Not on file    Inability: Not on file  . Transportation needs:    Medical: Not on file    Non-medical: Not on file  Tobacco Use  . Smoking status: Former Research scientist (life sciences)  . Smokeless tobacco: Never Used  . Tobacco comment: 07/29/2015 "might smoke a cigar once/year"    Substance and Sexual Activity  . Alcohol use: Yes    Alcohol/week: 14.0 standard drinks    Types: 14 Glasses of wine per week    Comment: 07/29/2015 "big glass of wine q night"  . Drug use: No  . Sexual activity: Not on file  Lifestyle  . Physical activity:    Days per week: Not on file    Minutes per session: Not on file  . Stress: Not on file  Relationships  . Social connections:    Talks on phone: Not on file    Gets together: Not on file    Attends religious service: Not on file    Active member of club or organization: Not on file    Attends meetings of clubs or organizations: Not on file    Relationship status: Not on file  . Intimate partner violence:    Fear of current or ex partner: Not on file    Emotionally abused: Not on file    Physically abused: Not on file    Forced sexual activity: Not on file  Other Topics Concern  . Not on file  Social History Narrative   Patient is single, has never been married.  Does not have any children.  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   He drinks approximately 3-4 glasses of wine per week.  No history of excessive alcohol use.  Occasionally smokes a cigar.   Spends winters in [place]         Past Surgical History:  Procedure Laterality Date  . BIOPSY  10/01/2011   Procedure: BIOPSY;  Surgeon: [name], MD;  [facility];  Service: Urology;;  biopsy of bladder tumor  . BLADDER SUSPENSION  08/18/10   [name]  . CARDIAC CATHETERIZATION N/A 07/29/2015   Procedure: Right/Left Heart Cath and Coronary Angiography;  Surgeon: Peter M Martinique, MD;  Location: [place] CV LAB;  Service: Cardiovascular;  Laterality: N/A;  . CATARACT EXTRACTION W/ INTRAOCULAR LENS  IMPLANT, BILATERAL Bilateral   . CYSTOSCOPY W/ RETROGRADES  01/27/2012   Procedure: CYSTOSCOPY WITH RETROGRADE PYELOGRAM;  Surgeon: [name], MD;  [facility];  Service: Urology;  Laterality: Left;  . CYSTOSCOPY WITH RETROGRADE PYELOGRAM,  URETEROSCOPY AND STENT PLACEMENT Left 11/24/2012   Procedure: CYSTOSCOPY WITH left RETROGRADE PYELOGRAM, bladder washings, ureteroscopy;  Surgeon: [name], MD;  [facility];  Service: Urology;  Laterality: Left;  . CYSTOSCOPY WITH RETROGRADE PYELOGRAM, URETEROSCOPY AND STENT PLACEMENT Bilateral 11/19/2013   Procedure: CYSTOSCOPY WITH BILATERAL RETROGRADE PYELOGRAM,;  Surgeon: [name], MD;  [facility];  Service: Urology;  Laterality: Bilateral;  . EYE SURGERY  09/24/10   left eye. "Retina surgery"  . INGUINAL HERNIA REPAIR  1960  . PROSTATE SURGERY     s/p laparoscopic prostate surgery  . SKIN BIOPSY  05/20/2017   Superficial and nodular basal cell carcinoma (left temple)    basal cell carcinoma with sclerosis (right temple)  . SKIN CANCER EXCISION     a/p skin cancer right foot-non melanoma  . skin laceration    . TEE WITHOUT CARDIOVERSION N/A 07/29/2015   Procedure: TRANSESOPHAGEAL ECHOCARDIOGRAM (TEE);  Surgeon: Pixie Casino, MD;  Location: [facility] ENDOSCOPY;  Service: Cardiovascular;  Laterality: N/A;  cath to follow  . TRANSURETHRAL RESECTION OF BLADDER TUMOR  10/01/2011   Procedure: TRANSURETHRAL RESECTION OF BLADDER TUMOR (TURBT);  Surgeon: [name], MD;  [facility];  Service: Urology;  Laterality: N/A;  . TRANSURETHRAL RESECTION OF BLADDER TUMOR N/A 11/19/2013   Procedure: TRANSURETHRAL RESECTION OF BLADDER TUMOR (TURBT);  Surgeon: [name], MD;  [facility];  Service: Urology;  Laterality: N/A;  . URETEROSCOPY  01/27/2012   Procedure: URETEROSCOPY;  Surgeon: [name], MD;  [facility];  Service: Urology;  Laterality: Left;  CYSTO, Left RETROGRADE PYELOGRPHY, LEFT URETEROSCOPY   . URINARY SPHINCTER IMPLANT    . VARICOSE VEIN SURGERY      Family History  Problem Relation Age of Onset  . Colon cancer Neg Hx   . Colon polyps Neg Hx     No Known Allergies  Current Outpatient Medications on File Prior to Visit  Medication Sig Dispense Refill    . apixaban (ELIQUIS) 2.5 MG TABS tablet Take 1 tablet (2.5 mg total) by mouth 2 (two) times daily. 180 tablet 0  . B Complex-C (B-COMPLEX WITH VITAMIN C) tablet Take 1 tablet by mouth daily. Reported on 07/24/2015 90 tablet 3  . cholecalciferol (VITAMIN D) 1000 units tablet Take 1 tablet (1,000 Units total) by mouth daily. 90 tablet 3  . furosemide (LASIX) 40 MG tablet Take 1 tablet (40 mg total) by mouth daily. 90 tablet 3  . methylcellulose (ARTIFICIAL TEARS) 1 % ophthalmic solution Place 1 drop into both eyes  2 (two) times daily.     . metoprolol succinate (TOPROL-XL) 25 MG 24 hr tablet Take 1 tablet (25 mg total) by mouth at bedtime. 90 tablet 0  . mirabegron ER (MYRBETRIQ) 50 MG TB24 tablet Take 1 tablet (50 mg total) by mouth daily. 90 tablet 0  . Multiple Vitamins-Minerals (PRESERVISION/LUTEIN PO) Take 1 tablet by mouth daily.    [name] omeprazole (PRILOSEC) 20 MG capsule Take 1 capsule (20 mg total) by mouth daily. 30 capsule 1  . potassium chloride (K-DUR,KLOR-CON) 10 MEQ tablet Take 1 tablet (10 mEq total) by mouth daily. 90 tablet 0  . pravastatin (PRAVACHOL) 20 MG tablet Take 1 tablet (20 mg total) by mouth at bedtime. 90 tablet 0  . UNABLE TO FIND diosmin     No current facility-administered medications on file prior to visit.     BP (!) 118/58   Temp 98.1 F (36.7 C) (Oral)   Wt 122 lb (55.3 kg)   BMI 19.40 kg/m       Objective:   Physical Exam  Constitutional: He is oriented to person, place, and time. He appears well-developed and well-nourished. No distress.  Cardiovascular: Normal rate and intact distal pulses. An irregularly irregular rhythm present.  Murmur heard. Pulmonary/Chest: Effort normal and breath sounds normal.  Abdominal: Soft. Bowel sounds are normal.  Neurological: He is alert and oriented to person, place, and time.  Skin: Skin is warm and dry. He is not diaphoretic.  Psychiatric: He has a normal mood and affect. His behavior is normal. Judgment and  thought content normal.  Nursing note and vitals reviewed.     Assessment & Plan:  We reviewed notes from GI in detail reviewed ways to make swallowing food easier for him.  He has lost some weight and I think some of this fatigue is stemming from nutritional deficiency.  I recommended him to start drinking some Ensure or other meal replacement shakes to help with nutrition.  He has been a follow-up as soon as he gets back from [place], I wished him a safe and enjoyable trip  [name], NP

## 2018-01-02 NOTE — Progress Notes (Signed)
I saw him in clinic, discussed SLP evaluation and treatment. Leaving for [place] soon where he will remain for next several months. He wants to check into seeing SLP there. If this plan falls through he will contact us upon return to [place]

## 2018-01-09 ENCOUNTER — Other Ambulatory Visit: Payer: Self-pay | Admitting: Adult Health

## 2018-01-09 DIAGNOSIS — Z760 Encounter for issue of repeat prescription: Secondary | ICD-10-CM

## 2018-01-09 DIAGNOSIS — R600 Localized edema: Secondary | ICD-10-CM

## 2018-01-10 NOTE — Telephone Encounter (Signed)
Jeremy Kelley is away for the winter. Ok to refuse

## 2018-01-10 NOTE — Telephone Encounter (Signed)
How long do you wish to fill since pt is leaving for winter vacation?

## 2018-03-17 ENCOUNTER — Telehealth: Payer: Self-pay | Admitting: Family Medicine

## 2018-03-17 ENCOUNTER — Other Ambulatory Visit: Payer: Self-pay | Admitting: Adult Health

## 2018-03-17 DIAGNOSIS — Z760 Encounter for issue of repeat prescription: Secondary | ICD-10-CM

## 2018-03-17 DIAGNOSIS — I10 Essential (primary) hypertension: Secondary | ICD-10-CM

## 2018-03-17 NOTE — Telephone Encounter (Signed)
Received a request from [facility] on [address]. This medication is not active.  Note from 09/14/17 says to take every other day.  Please advise.

## 2018-03-17 NOTE — Telephone Encounter (Signed)
Noted  

## 2018-03-17 NOTE — Telephone Encounter (Signed)
Lets hold off on this until he returns

## 2018-04-07 ENCOUNTER — Ambulatory Visit (INDEPENDENT_AMBULATORY_CARE_PROVIDER_SITE_OTHER): Payer: Medicare Other | Admitting: Adult Health

## 2018-04-07 ENCOUNTER — Encounter: Payer: Self-pay | Admitting: Adult Health

## 2018-04-07 VITALS — BP 130/82 | HR 54 | Temp 98.2°F | Resp 16 | Ht 67.0 in | Wt 127.0 lb

## 2018-04-07 DIAGNOSIS — I25119 Atherosclerotic heart disease of native coronary artery with unspecified angina pectoris: Secondary | ICD-10-CM

## 2018-04-07 DIAGNOSIS — R5383 Other fatigue: Secondary | ICD-10-CM | POA: Diagnosis not present

## 2018-04-07 DIAGNOSIS — R2681 Unsteadiness on feet: Secondary | ICD-10-CM

## 2018-04-07 LAB — CBC WITH DIFFERENTIAL/PLATELET
BASOS PCT: 0.6 % (ref 0.0–3.0)
Basophils Absolute: 0 10*3/uL (ref 0.0–0.1)
Eosinophils Absolute: 0.2 10*3/uL (ref 0.0–0.7)
Eosinophils Relative: 2.8 % (ref 0.0–5.0)
HCT: 40.2 % (ref 39.0–52.0)
HEMOGLOBIN: 13.7 g/dL (ref 13.0–17.0)
Lymphocytes Relative: 19.1 % (ref 12.0–46.0)
Lymphs Abs: 1.4 10*3/uL (ref 0.7–4.0)
MCHC: 34.2 g/dL (ref 30.0–36.0)
MCV: 95.5 fl (ref 78.0–100.0)
Monocytes Absolute: 0.4 10*3/uL (ref 0.1–1.0)
Monocytes Relative: 6 % (ref 3.0–12.0)
Neutro Abs: 5.3 10*3/uL (ref 1.4–7.7)
Neutrophils Relative %: 71.5 % (ref 43.0–77.0)
Platelets: 193 10*3/uL (ref 150.0–400.0)
RBC: 4.21 Mil/uL — AB (ref 4.22–5.81)
RDW: 13.3 % (ref 11.5–15.5)
WBC: 7.4 10*3/uL (ref 4.0–10.5)

## 2018-04-07 LAB — IRON,TIBC AND FERRITIN PANEL
%SAT: 37 % (calc) (ref 20–48)
Ferritin: 50 ng/mL (ref 24–380)
Iron: 124 ug/dL (ref 50–180)
TIBC: 335 mcg/dL (calc) (ref 250–425)

## 2018-04-07 LAB — VITAMIN D 25 HYDROXY (VIT D DEFICIENCY, FRACTURES): VITD: 73.28 ng/mL (ref 30.00–100.00)

## 2018-04-07 LAB — TSH: TSH: 0.95 u[IU]/mL (ref 0.35–4.50)

## 2018-04-07 LAB — VITAMIN B12: Vitamin B-12: 772 pg/mL (ref 211–911)

## 2018-04-07 NOTE — Progress Notes (Signed)
Subjective:    Patient ID: Jeremy Kelley, male    DOB: 1926/10/07, 84 y.o.   MRN: [id number]  HPI   84 year old male who  has a past medical history of Arthritis, Carotid artery stenosis (11/2008), Colon polyps, Diverticulosis, Hepatitis, Hyperlipidemia, Hypertension, Numbness, Phimosis, Prostate cancer ([place]) (2001), and Urothelial carcinoma of left distal ureter ([place]).  Presents to the office today for follow-up after returning from 71-month vacation in [place].  Reports that he had a good trip overall but feels as though while he was there his health started to deteriorate.  Per patient he started noticing that he was having more generalized fatigue and trouble walking distances greater than two blocks without getting tired.   He also reports that cervical spine pain and low back pain became slightly worse.  Additionally, feels as though he has loss of balance and lack of coordination while walking and has started to have to use a cane to help with gait.  He denies any falls while in [place].  Denies any chest pain or shortness of breath  Wt Readings from Last 3 Encounters:  04/07/18 127 lb (57.6 kg)  12/22/17 122 lb (55.3 kg)  12/21/17 122 lb 4 oz (55.5 kg)    Review of Systems See HPI   Past Medical History:  Diagnosis Date  . Arthritis   . Carotid artery stenosis 11/2008   bilateral 40-59% stenosis  . Colon polyps   . Diverticulosis   . Hepatitis    doesn't know which type 1969  . Hyperlipidemia   . Hypertension   . Numbness    fingertips  . Phimosis   . Prostate cancer ([place]) 2001  . Urothelial carcinoma of left distal ureter [facility])     Social History   Socioeconomic History  . Marital status: Single    Spouse name: Not on file  . Number of children: 0  . Years of education: Not on file  . Highest education level: Not on file  Occupational History  . Occupation: Retired  Scientific laboratory technician  . Financial resource strain: Not on file  . Food insecurity:   Worry: Not on file    Inability: Not on file  . Transportation needs:    Medical: Not on file    Non-medical: Not on file  Tobacco Use  . Smoking status: Former Research scientist (life sciences)  . Smokeless tobacco: Never Used  . Tobacco comment: 07/29/2015 "might smoke a cigar once/year"  Substance and Sexual Activity  . Alcohol use: Yes    Alcohol/week: 14.0 standard drinks    Types: 14 Glasses of wine per week    Comment: 07/29/2015 "big glass of wine q night"  . Drug use: No  . Sexual activity: Not on file  Lifestyle  . Physical activity:    Days per week: Not on file    Minutes per session: Not on file  . Stress: Not on file  Relationships  . Social connections:    Talks on phone: Not on file    Gets together: Not on file    Attends religious service: Not on file    Active member of club or organization: Not on file    Attends meetings of clubs or organizations: Not on file    Relationship status: Not on file  . Intimate partner violence:    Fear of current or ex partner: Not on file    Emotionally abused: Not on file    Physically abused: Not on file  Forced sexual activity: Not on file  Other Topics Concern  . Not on file  Social History Narrative   Patient is single, has never been married.  Does not have any children.  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   He drinks approximately 3-4 glasses of wine per week.  No history of excessive alcohol use.  Occasionally smokes a cigar.   Spends winters in [place]         Past Surgical History:  Procedure Laterality Date  . BIOPSY  10/01/2011   Procedure: BIOPSY;  Surgeon: [name], MD;  Location: [facility];  Service: Urology;;  biopsy of bladder tumor  . BLADDER SUSPENSION  08/18/10   [name]  . CARDIAC CATHETERIZATION N/A 07/29/2015   Procedure: Right/Left Heart Cath and Coronary Angiography;  Surgeon: Peter M Martinique, MD;  Location: [facility];  Service: Cardiovascular;  Laterality: N/A;  . CATARACT EXTRACTION  W/ INTRAOCULAR LENS  IMPLANT, BILATERAL Bilateral   . CYSTOSCOPY W/ RETROGRADES  01/27/2012   Procedure: CYSTOSCOPY WITH RETROGRADE PYELOGRAM;  Surgeon: [name], MD;  Location: [facility];  Service: Urology;  Laterality: Left;  . CYSTOSCOPY WITH RETROGRADE PYELOGRAM, URETEROSCOPY AND STENT PLACEMENT Left 11/24/2012   Procedure: CYSTOSCOPY WITH left RETROGRADE PYELOGRAM, bladder washings, ureteroscopy;  Surgeon: [name], MD;  Location: [facility];  Service: Urology;  Laterality: Left;  . CYSTOSCOPY WITH RETROGRADE PYELOGRAM, URETEROSCOPY AND STENT PLACEMENT Bilateral 11/19/2013   Procedure: CYSTOSCOPY WITH BILATERAL RETROGRADE PYELOGRAM,;  Surgeon: [name], MD;  Location: [facility];  Service: Urology;  Laterality: Bilateral;  . EYE SURGERY  09/24/10   left eye. "Retina surgery"  . INGUINAL HERNIA REPAIR  1960  . PROSTATE SURGERY     s/p laparoscopic prostate surgery  . SKIN BIOPSY  05/20/2017   Superficial and nodular basal cell carcinoma (left temple)    basal cell carcinoma with sclerosis (right temple)  . SKIN CANCER EXCISION     a/p skin cancer right foot-non melanoma  . skin laceration    . TEE WITHOUT CARDIOVERSION N/A 07/29/2015   Procedure: TRANSESOPHAGEAL ECHOCARDIOGRAM (TEE);  Surgeon: Pixie Casino, MD;  Location: [facility];  Service: Cardiovascular;  Laterality: N/A;  cath to follow  . TRANSURETHRAL RESECTION OF BLADDER TUMOR  10/01/2011   Procedure: TRANSURETHRAL RESECTION OF BLADDER TUMOR (TURBT);  Surgeon: [name], MD;  Location: [facility];  Service: Urology;  Laterality: N/A;  . TRANSURETHRAL RESECTION OF BLADDER TUMOR N/A 11/19/2013   Procedure: TRANSURETHRAL RESECTION OF BLADDER TUMOR (TURBT);  Surgeon: [name], MD;  Location: [facility];  Service: Urology;  Laterality: N/A;  . URETEROSCOPY  01/27/2012   Procedure: URETEROSCOPY;  Surgeon: [name], MD;  Location: [facility];  Service: Urology;  Laterality: Left;  CYSTO, Left RETROGRADE PYELOGRPHY, LEFT URETEROSCOPY   .  URINARY SPHINCTER IMPLANT    . VARICOSE VEIN SURGERY      Family History  Problem Relation Age of Onset  . Colon cancer Neg Hx   . Colon polyps Neg Hx     No Known Allergies  Current Outpatient Medications on File Prior to Visit  Medication Sig Dispense Refill  . B Complex-C (B-COMPLEX WITH VITAMIN C) tablet Take 1 tablet by mouth daily. Reported on 07/24/2015 90 tablet 3  . cholecalciferol (VITAMIN D) 1000 units tablet Take 1 tablet (1,000 Units total) by mouth daily. 90 tablet 3  . methylcellulose (ARTIFICIAL TEARS) 1 % ophthalmic solution Place 1 drop into both eyes 2 (two) times  daily.     . mirabegron ER (MYRBETRIQ) 50 MG TB24 tablet Take 1 tablet (50 mg total) by mouth daily. 90 tablet 0  . Multiple Vitamins-Minerals (PRESERVISION/LUTEIN PO) Take 1 tablet by mouth daily.    . potassium chloride (K-DUR,KLOR-CON) 10 MEQ tablet Take 1 tablet (10 mEq total) by mouth daily. 90 tablet 0  . UNABLE TO FIND diosmin    . apixaban (ELIQUIS) 2.5 MG TABS tablet Take 1 tablet (2.5 mg total) by mouth 2 (two) times daily. 180 tablet 0  . furosemide (LASIX) 40 MG tablet Take 1 tablet (40 mg total) by mouth daily. 90 tablet 3  . metoprolol succinate (TOPROL-XL) 25 MG 24 hr tablet Take 1 tablet (25 mg total) by mouth at bedtime. 90 tablet 0  . omeprazole (PRILOSEC) 20 MG capsule Take 1 capsule (20 mg total) by mouth daily. 30 capsule 1  . pravastatin (PRAVACHOL) 20 MG tablet Take 1 tablet (20 mg total) by mouth at bedtime. 90 tablet 0   No current facility-administered medications on file prior to visit.     BP 130/82 (BP Location: Left Arm, Patient Position: Sitting, Cuff Size: Normal)   Pulse (!) 54   Temp 98.2 F (36.8 C) (Oral)   Resp 16   Ht 5\' 7"  (1.702 m)   Wt 127 lb (57.6 kg)   SpO2 93%   BMI 19.89 kg/m       Objective:   Physical Exam Vitals signs and nursing note reviewed.  Constitutional:      Appearance: Normal appearance.  HENT:     Nose: Nose normal. No congestion or  rhinorrhea.     Mouth/Throat:     Mouth: Mucous membranes are moist.     Pharynx: Oropharynx is clear.  Eyes:     Conjunctiva/sclera: Conjunctivae normal.     Pupils: Pupils are equal, round, and reactive to light.  Neck:     Musculoskeletal: Normal range of motion and neck supple.  Cardiovascular:     Rate and Rhythm: Normal rate. Rhythm irregularly irregular.     Pulses: Normal pulses.     Heart sounds: Murmur present. No friction rub. No gallop.   Pulmonary:     Effort: Pulmonary effort is normal.     Breath sounds: Normal breath sounds.  Musculoskeletal:     Right lower leg: Edema present.     Left lower leg: Edema present.  Skin:    Capillary Refill: Capillary refill takes less than 2 seconds.  Neurological:     General: No focal deficit present.     Mental Status: He is alert and oriented to person, place, and time.  Psychiatric:        Mood and Affect: Mood normal.        Behavior: Behavior normal.        Thought Content: Thought content normal.        Judgment: Judgment normal.       Assessment & Plan:  1. Fatigue, unspecified type -Advised that his symptoms are likely multifactorial due to atrial fibrillation aortic insufficiency as well as age.  Will check CBC, iron panel, TSH, vitamin D, and vitamin B12 to look for other causes. - CBC with Differential/Platelet - Iron, TIBC and Ferritin Panel - TSH - Vitamin D, 25-hydroxy - Vitamin B12  2. Gait instability  - Ambulatory referral to Physical Therapy  [name], NP

## 2018-04-09 ENCOUNTER — Other Ambulatory Visit: Payer: Self-pay | Admitting: Adult Health

## 2018-04-09 DIAGNOSIS — I10 Essential (primary) hypertension: Secondary | ICD-10-CM

## 2018-04-09 DIAGNOSIS — Z760 Encounter for issue of repeat prescription: Secondary | ICD-10-CM

## 2018-04-11 ENCOUNTER — Telehealth: Payer: Self-pay | Admitting: Adult Health

## 2018-04-11 DIAGNOSIS — R935 Abnormal findings on diagnostic imaging of other abdominal regions, including retroperitoneum: Secondary | ICD-10-CM

## 2018-04-11 NOTE — Telephone Encounter (Signed)
Jeremy Rumps, do you need to call this pt?

## 2018-04-11 NOTE — Telephone Encounter (Signed)
Patient is requesting a call to discuss referrals.  Patient was advised per [facility] that it could take up to 2 weeks for the referring office to call with an appointment.  Patient still wants a call back//tes

## 2018-04-13 ENCOUNTER — Other Ambulatory Visit: Payer: Self-pay | Admitting: Adult Health

## 2018-04-13 ENCOUNTER — Encounter: Payer: Self-pay | Admitting: Family Medicine

## 2018-04-13 DIAGNOSIS — R1033 Periumbilical pain: Secondary | ICD-10-CM

## 2018-04-13 NOTE — Progress Notes (Signed)
Patient complaining of intermittent abdominal pain around umbilicus. Also report decreased stool, " looks like rabbit pellets". Denies bleeding, nausea, or vomiting.

## 2018-04-14 DIAGNOSIS — H35033 Hypertensive retinopathy, bilateral: Secondary | ICD-10-CM | POA: Diagnosis not present

## 2018-04-14 DIAGNOSIS — H353131 Nonexudative age-related macular degeneration, bilateral, early dry stage: Secondary | ICD-10-CM | POA: Diagnosis not present

## 2018-04-17 ENCOUNTER — Ambulatory Visit (INDEPENDENT_AMBULATORY_CARE_PROVIDER_SITE_OTHER): Payer: Medicare Other | Admitting: Cardiovascular Disease

## 2018-04-17 ENCOUNTER — Encounter: Payer: Self-pay | Admitting: Cardiovascular Disease

## 2018-04-17 VITALS — BP 140/80 | HR 64 | Ht 67.0 in | Wt 130.0 lb

## 2018-04-17 DIAGNOSIS — I272 Pulmonary hypertension, unspecified: Secondary | ICD-10-CM | POA: Diagnosis not present

## 2018-04-17 DIAGNOSIS — I351 Nonrheumatic aortic (valve) insufficiency: Secondary | ICD-10-CM | POA: Diagnosis not present

## 2018-04-17 DIAGNOSIS — I482 Chronic atrial fibrillation, unspecified: Secondary | ICD-10-CM | POA: Diagnosis not present

## 2018-04-17 NOTE — Patient Instructions (Addendum)
Medication Instructions:  Your physician recommends that you continue on your current medications as directed. Please refer to the Current Medication list given to you today.  If you need a refill on your cardiac medications before your next appointment, please call your pharmacy.   Lab work: TODAY - basic metabolic panel  If you have labs (blood work) drawn today and your tests are completely normal, you will receive your results only by: [name] MyChart Message (if you have MyChart) OR . A paper copy in the mail If you have any lab test that is abnormal or we need to change your treatment, we will call you to review the results.  Testing/Procedures: Your physician has requested that you have an echocardiogram. Echocardiography is a painless test that uses sound waves to create images of your heart. It provides your doctor with information about the size and shape of your heart and how well your heart's chambers and valves are working. This procedure takes approximately one hour. There are no restrictions for this procedure.   Follow-Up: At [facility], you and your health needs are our priority.  As part of our continuing mission to provide you with exceptional heart care, we have created designated Provider Care Teams.  These Care Teams include your primary Cardiologist (physician) and Advanced Practice Providers (APPs -  Physician Assistants and Nurse Practitioners) who all work together to provide you with the care you need, when you need it. You will need a follow up appointment in:  3 months.  You may see Mertie Moores, MD or one of the following Advanced Practice Providers on your designated Care Team: Richardson Dopp, PA-C [place], [place] . Daune Perch, NP   For your  leg edema you  should do  the following 1. Leg elevation - I recommend the Lounge Dr. Leg rest.    See below for details  2. Salt restriction  -  Use potassium chloride instead of regular salt as a salt  substitute. 3. Walk regularly 4. Compression hose - [facility] 5. Weight loss    Available on [email or web address] Or  Go to [email or web address]       If your dizziness does not resolve, consider holding furosemide for a day and see how you feel.
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
## 2018-04-18 ENCOUNTER — Ambulatory Visit (INDEPENDENT_AMBULATORY_CARE_PROVIDER_SITE_OTHER): Payer: Medicare Other | Admitting: Adult Health

## 2018-04-18 ENCOUNTER — Encounter: Payer: Self-pay | Admitting: Adult Health

## 2018-04-18 VITALS — BP 108/50 | Temp 97.9°F | Ht 67.0 in | Wt 131.0 lb

## 2018-04-18 DIAGNOSIS — R1033 Periumbilical pain: Secondary | ICD-10-CM

## 2018-04-18 LAB — BASIC METABOLIC PANEL
BUN/Creatinine Ratio: 22 (ref 10–24)
BUN: 22 mg/dL (ref 8–27)
CO2: 28 mmol/L (ref 20–29)
Calcium: 9.8 mg/dL (ref 8.6–10.2)
Chloride: 103 mmol/L (ref 96–106)
Creatinine, Ser: 1.01 mg/dL (ref 0.76–1.27)
GFR calc Af Amer: 76 mL/min/{1.73_m2} (ref >59)
GFR calc non Af Amer: 66 mL/min/{1.73_m2} (ref >59)
Glucose: 101 mg/dL — ABNORMAL HIGH (ref 65–99)
Potassium: 4.4 mmol/L (ref 3.5–5.2)
Sodium: 145 mmol/L — ABNORMAL HIGH (ref 134–144)

## 2018-04-18 NOTE — Progress Notes (Signed)
Subjective:    Patient ID: Jeremy Kelley, male    DOB: 1926/09/03, 85 y.o.   MRN: [id number]  HPI 85 year old male who  has a past medical history of Arthritis, Carotid artery stenosis (11/2008), Colon polyps, Diverticulosis, Hepatitis, Hyperlipidemia, Hypertension, Numbness, Phimosis, Prostate cancer ([place]) (2001), and Urothelial carcinoma of left distal ureter ([facility]).  He presents to the office today for follow-up after being seen by cardiology yesterday.  He would like to review cardiology notes with me.  Continues to feel fatigued but feels as though this is improved slightly since returning from [place].  He has an upcoming echo cardiogram at the end of this week.  Generally he continues to have intermittent episodes of lower abdominal pain.  Does report constipation.  Denies nausea, vomiting, or blood in stool.  He has a hard time getting daily fiber because he eats out most meals   Review of Systems See HPI   Past Medical History:  Diagnosis Date  . Arthritis   . Carotid artery stenosis 11/2008   bilateral 40-59% stenosis  . Colon polyps   . Diverticulosis   . Hepatitis    doesn't know which type 1969  . Hyperlipidemia   . Hypertension   . Numbness    fingertips  . Phimosis   . Prostate cancer ([place]) 2001  . Urothelial carcinoma of left distal ureter [facility])     Social History   Socioeconomic History  . Marital status: Single    Spouse name: Not on file  . Number of children: 0  . Years of education: Not on file  . Highest education level: Not on file  Occupational History  . Occupation: Retired  Scientific laboratory technician  . Financial resource strain: Not on file  . Food insecurity:    Worry: Not on file    Inability: Not on file  . Transportation needs:    Medical: Not on file    Non-medical: Not on file  Tobacco Use  . Smoking status: Former Research scientist (life sciences)  . Smokeless tobacco: Never Used  . Tobacco comment: 07/29/2015 "might smoke a cigar once/year"  Substance and Sexual  Activity  . Alcohol use: Yes    Alcohol/week: 14.0 standard drinks    Types: 14 Glasses of wine per week    Comment: 07/29/2015 "big glass of wine q night"  . Drug use: No  . Sexual activity: Not on file  Lifestyle  . Physical activity:    Days per week: Not on file    Minutes per session: Not on file  . Stress: Not on file  Relationships  . Social connections:    Talks on phone: Not on file    Gets together: Not on file    Attends religious service: Not on file    Active member of club or organization: Not on file    Attends meetings of clubs or organizations: Not on file    Relationship status: Not on file  . Intimate partner violence:    Fear of current or ex partner: Not on file    Emotionally abused: Not on file    Physically abused: Not on file    Forced sexual activity: Not on file  Other Topics Concern  . Not on file  Social History Narrative   Patient is single, has never been married.  Does not have any children.  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   He drinks approximately 3-4 glasses of wine per week.  No history of excessive alcohol use.  Occasionally smokes a cigar.   Spends winters in [place]         Past Surgical History:  Procedure Laterality Date  . BIOPSY  10/01/2011   Procedure: BIOPSY;  Surgeon: [name], MD;  [facility];  Service: Urology;;  biopsy of bladder tumor  . BLADDER SUSPENSION  08/18/10   [name]  . CARDIAC CATHETERIZATION N/A 07/29/2015   Procedure: Right/Left Heart Cath and Coronary Angiography;  Surgeon: Peter M Martinique, MD;  Location: [facility];  Service: Cardiovascular;  Laterality: N/A;  . CATARACT EXTRACTION W/ INTRAOCULAR LENS  IMPLANT, BILATERAL Bilateral   . CYSTOSCOPY W/ RETROGRADES  01/27/2012   Procedure: CYSTOSCOPY WITH RETROGRADE PYELOGRAM;  Surgeon: [name], MD;  [facility];  Service: Urology;  Laterality: Left;  . CYSTOSCOPY WITH RETROGRADE PYELOGRAM, URETEROSCOPY AND STENT  PLACEMENT Left 11/24/2012   Procedure: CYSTOSCOPY WITH left RETROGRADE PYELOGRAM, bladder washings, ureteroscopy;  Surgeon: [name], MD;  [facility];  Service: Urology;  Laterality: Left;  . CYSTOSCOPY WITH RETROGRADE PYELOGRAM, URETEROSCOPY AND STENT PLACEMENT Bilateral 11/19/2013   Procedure: CYSTOSCOPY WITH BILATERAL RETROGRADE PYELOGRAM,;  Surgeon: [name], MD;  [facility];  Service: Urology;  Laterality: Bilateral;  . EYE SURGERY  09/24/10   left eye. "Retina surgery"  . INGUINAL HERNIA REPAIR  1960  . PROSTATE SURGERY     s/p laparoscopic prostate surgery  . SKIN BIOPSY  05/20/2017   Superficial and nodular basal cell carcinoma (left temple)    basal cell carcinoma with sclerosis (right temple)  . SKIN CANCER EXCISION     a/p skin cancer right foot-non melanoma  . skin laceration    . TEE WITHOUT CARDIOVERSION N/A 07/29/2015   Procedure: TRANSESOPHAGEAL ECHOCARDIOGRAM (TEE);  Surgeon: Pixie Casino, MD;  Location[name] END[facility]COPY;  Service: Cardiovascular;  Laterality: N/A;  cath to follow  . TRANSURETHRAL RESECTION OF BLADDER TUMOR  10/01/2011   Procedure: TRANSURETHRAL RESECTION OF BLADDER TUMOR (TURBT);  Surgeon: [name], MD;  [facility];  Service: Urology;  Laterality: N/A;  . TRANSURETHRAL RESECTION OF BLADDER TUMOR N/A 11/19/2013   Procedure: TRANSURETHRAL RESECTION OF BLADDER TUMOR (TURBT);  Surgeon: [name], MD;  [facility];  Service: Urology;  Laterality: N/A;  . URETEROSCOPY  01/27/2012   Procedure: URETEROSCOPY;  Surgeon: [name], MD;  [facility];  Service: Urology;  Laterality: Left;  CYSTO, Left RETROGRADE PYELOGRPHY, LEFT URETEROSCOPY   . URINARY SPHINCTER IMPLANT    . VARICOSE VEIN SURGERY      Family History  Problem Relation Age of Onset  . Colon cancer Neg Hx   . Colon polyps Neg Hx     No Known Allergies  Current Outpatient Medications on File Prior to Visit  Medication Sig Dispense Refill  . amLODipine  (NORVASC) 2.5 MG tablet TAKE 1 TABLET BY MOUTH EVERY DAY 90 tablet 1  . B Complex-C (B-COMPLEX WITH VITAMIN C) tablet Take 1 tablet by mouth daily. Reported on 07/24/2015 90 tablet 3  . cholecalciferol (VITAMIN D) 1000 units tablet Take 1 tablet (1,000 Units total) by mouth daily. 90 tablet 3  . KLOR-CON M10 10 MEQ tablet TAKE 1 TABLET BY MOUTH EVERY DAY 90 tablet 1  . methylcellulose (ARTIFICIAL TEARS) 1 % ophthalmic solution Place 1 drop into both eyes 2 (two) times daily.     . mirabegron ER (MYRBETRIQ) 50 MG TB24 tablet Take 1 tablet (50 mg total) by mouth daily.  90 tablet 0  . Multiple Vitamins-Minerals (PRESERVISION/LUTEIN PO) Take 1 tablet by mouth daily.    [name] UNABLE TO FIND diosmin    . apixaban (ELIQUIS) 2.5 MG TABS tablet Take 1 tablet (2.5 mg total) by mouth 2 (two) times daily. 180 tablet 0  . furosemide (LASIX) 40 MG tablet Take 1 tablet (40 mg total) by mouth daily. 90 tablet 3  . metoprolol succinate (TOPROL-XL) 25 MG 24 hr tablet Take 1 tablet (25 mg total) by mouth at bedtime. 90 tablet 0  . pravastatin (PRAVACHOL) 20 MG tablet Take 1 tablet (20 mg total) by mouth at bedtime. 90 tablet 0   No current facility-administered medications on file prior to visit.     BP (!) 108/50   Temp 97.9 F (36.6 C)   Ht 5\' 7"  (1.702 m)   Wt 131 lb (59.4 kg)   BMI 20.52 kg/m       Objective:   Physical Exam Vitals signs and nursing note reviewed.  Constitutional:      Appearance: Normal appearance.  Abdominal:     General: Bowel sounds are normal. There is no distension.     Palpations: There is no mass.     Tenderness: There is abdominal tenderness in the periumbilical area and suprapubic area. There is no rebound. Negative signs include Murphy's sign, Rovsing's sign and McBurney's sign.     Hernia: No hernia is present.  Skin:    General: Skin is warm and dry.     Capillary Refill: Capillary refill takes less than 2 seconds.  Neurological:     General: No focal deficit  present.     Mental Status: He is alert. Mental status is at baseline.  Psychiatric:        Mood and Affect: Mood normal.        Behavior: Behavior normal.        Thought Content: Thought content normal.        Judgment: Judgment normal.       Assessment & Plan:  1. Periumbilical abdominal pain -Has MRI of the abdomen scheduled for 2 weeks.  In the meantime advised to increase dietary fiber, can use Metamucil, Benefiber or eat a couple of prunes daily. -Follow-up as needed  [name], NP

## 2018-04-19 DIAGNOSIS — R2689 Other abnormalities of gait and mobility: Secondary | ICD-10-CM | POA: Diagnosis not present

## 2018-04-19 DIAGNOSIS — R2681 Unsteadiness on feet: Secondary | ICD-10-CM | POA: Diagnosis not present

## 2018-04-19 DIAGNOSIS — M545 Low back pain: Secondary | ICD-10-CM | POA: Diagnosis not present

## 2018-04-20 DIAGNOSIS — R350 Frequency of micturition: Secondary | ICD-10-CM | POA: Diagnosis not present

## 2018-04-20 DIAGNOSIS — N481 Balanitis: Secondary | ICD-10-CM | POA: Diagnosis not present

## 2018-04-21 ENCOUNTER — Other Ambulatory Visit: Payer: Self-pay

## 2018-04-21 ENCOUNTER — Ambulatory Visit (HOSPITAL_COMMUNITY): Payer: Medicare Other | Attending: Cardiovascular Disease

## 2018-04-21 DIAGNOSIS — I272 Pulmonary hypertension, unspecified: Secondary | ICD-10-CM | POA: Insufficient documentation

## 2018-04-21 DIAGNOSIS — I8312 Varicose veins of left lower extremity with inflammation: Secondary | ICD-10-CM | POA: Diagnosis not present

## 2018-04-21 DIAGNOSIS — L853 Xerosis cutis: Secondary | ICD-10-CM | POA: Diagnosis not present

## 2018-04-21 DIAGNOSIS — I351 Nonrheumatic aortic (valve) insufficiency: Secondary | ICD-10-CM | POA: Insufficient documentation

## 2018-04-21 DIAGNOSIS — I8311 Varicose veins of right lower extremity with inflammation: Secondary | ICD-10-CM | POA: Diagnosis not present

## 2018-04-21 DIAGNOSIS — L570 Actinic keratosis: Secondary | ICD-10-CM | POA: Diagnosis not present

## 2018-04-21 DIAGNOSIS — I8393 Asymptomatic varicose veins of bilateral lower extremities: Secondary | ICD-10-CM | POA: Diagnosis not present

## 2018-04-21 DIAGNOSIS — I482 Chronic atrial fibrillation, unspecified: Secondary | ICD-10-CM | POA: Diagnosis not present

## 2018-04-25 ENCOUNTER — Encounter: Payer: Self-pay | Admitting: Adult Health

## 2018-04-25 ENCOUNTER — Ambulatory Visit (INDEPENDENT_AMBULATORY_CARE_PROVIDER_SITE_OTHER): Payer: Medicare Other | Admitting: Adult Health

## 2018-04-25 VITALS — BP 114/70 | Temp 97.7°F | Wt 131.0 lb

## 2018-04-25 DIAGNOSIS — I351 Nonrheumatic aortic (valve) insufficiency: Secondary | ICD-10-CM

## 2018-04-25 DIAGNOSIS — R5383 Other fatigue: Secondary | ICD-10-CM

## 2018-04-25 DIAGNOSIS — I270 Primary pulmonary hypertension: Secondary | ICD-10-CM

## 2018-04-25 NOTE — Progress Notes (Signed)
Subjective:    Patient ID: Jeremy Kelley, male    DOB: Jan 12, 1927, 85 y.o.   MRN: [id number]  HPI  85 year old male who  has a past medical history of Arthritis, Carotid artery stenosis (11/2008), Colon polyps, Diverticulosis, Hepatitis, Hyperlipidemia, Hypertension, Numbness, Phimosis, Prostate cancer ([place]) (2001), and Urothelial carcinoma of left distal ureter ([place]).  He presents to the office today to discuss his most recent echo results. He had echo done on 04/21/2017   Results showed  LV function remains unchanged.  His pulmonary pressures have increased from 55 to 76. This is likely the cause of his increasing shortness of breath and fatigue He should continue lasix Try to avoid eating salty foods  His cardiology office called him to inform him of results but he did not understand   Review of Systems See HPI   Past Medical History:  Diagnosis Date  . Arthritis   . Carotid artery stenosis 11/2008   bilateral 40-59% stenosis  . Colon polyps   . Diverticulosis   . Hepatitis    doesn't know which type 1969  . Hyperlipidemia   . Hypertension   . Numbness    fingertips  . Phimosis   . Prostate cancer ([place]) 2001  . Urothelial carcinoma of left distal ureter [facility])     Social History   Socioeconomic History  . Marital status: Single    Spouse name: Not on file  . Number of children: 0  . Years of education: Not on file  . Highest education level: Not on file  Occupational History  . Occupation: Retired  Scientific laboratory technician  . Financial resource strain: Not on file  . Food insecurity:    Worry: Not on file    Inability: Not on file  . Transportation needs:    Medical: Not on file    Non-medical: Not on file  Tobacco Use  . Smoking status: Former Research scientist (life sciences)  . Smokeless tobacco: Never Used  . Tobacco comment: 07/29/2015 "might smoke a cigar once/year"  Substance and Sexual Activity  . Alcohol use: Yes    Alcohol/week: 14.0 standard drinks    Types: 14 Glasses of  wine per week    Comment: 07/29/2015 "big glass of wine q night"  . Drug use: No  . Sexual activity: Not on file  Lifestyle  . Physical activity:    Days per week: Not on file    Minutes per session: Not on file  . Stress: Not on file  Relationships  . Social connections:    Talks on phone: Not on file    Gets together: Not on file    Attends religious service: Not on file    Active member of club or organization: Not on file    Attends meetings of clubs or organizations: Not on file    Relationship status: Not on file  . Intimate partner violence:    Fear of current or ex partner: Not on file    Emotionally abused: Not on file    Physically abused: Not on file    Forced sexual activity: Not on file  Other Topics Concern  . Not on file  Social History Narrative   Patient is single, has never been married.  Does not have any children.  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   He drinks approximately 3-4 glasses of wine per week.  No history of excessive alcohol use.  Occasionally smokes a cigar.   Spends winters  in [place]         Past Surgical History:  Procedure Laterality Date  . BIOPSY  10/01/2011   Procedure: BIOPSY;  Surgeon: [name], MD;  [facility]: [facility];  Service: Urology;;  biopsy of bladder tumor  . BLADDER SUSPENSION  08/18/10   [name]  . CARDIAC CATHETERIZATION N/A 07/29/2015   Procedure: Right/Left Heart Cath and Coronary Angiography;  Surgeon: Peter M Martinique, MD;  Location: [facility];  Service: Cardiovascular;  Laterality: N/A;  . CATARACT EXTRACTION W/ INTRAOCULAR LENS  IMPLANT, BILATERAL Bilateral   . CYSTOSCOPY W/ RETROGRADES  01/27/2012   Procedure: CYSTOSCOPY WITH RETROGRADE PYELOGRAM;  Surgeon: [name], MD;  [facility];  Service: Urology;  Laterality: Left;  . CYSTOSCOPY WITH RETROGRADE PYELOGRAM, URETEROSCOPY AND STENT PLACEMENT Left 11/24/2012   Procedure: CYSTOSCOPY WITH left RETROGRADE PYELOGRAM, bladder  washings, ureteroscopy;  Surgeon: [name], MD;  [facility];  Service: Urology;  Laterality: Left;  . CYSTOSCOPY WITH RETROGRADE PYELOGRAM, URETEROSCOPY AND STENT PLACEMENT Bilateral 11/19/2013   Procedure: CYSTOSCOPY WITH BILATERAL RETROGRADE PYELOGRAM,;  Surgeon: [name], MD;  [facility];  Service: Urology;  Laterality: Bilateral;  . EYE SURGERY  09/24/10   left eye. "Retina surgery"  . INGUINAL HERNIA REPAIR  1960  . PROSTATE SURGERY     s/p laparoscopic prostate surgery  . SKIN BIOPSY  05/20/2017   Superficial and nodular basal cell carcinoma (left temple)    basal cell carcinoma with sclerosis (right temple)  . SKIN CANCER EXCISION     a/p skin cancer right foot-non melanoma  . skin laceration    . TEE WITHOUT CARDIOVERSION N/A 07/29/2015   Procedure: TRANSESOPHAGEAL ECHOCARDIOGRAM (TEE);  Surgeon: Pixie Casino, MD;  Location: [facility];  Service: Cardiovascular;  Laterality: N/A;  cath to follow  . TRANSURETHRAL RESECTION OF BLADDER TUMOR  10/01/2011   Procedure: TRANSURETHRAL RESECTION OF BLADDER TUMOR (TURBT);  Surgeon: [name], MD;  [facility];  Service: Urology;  Laterality: N/A;  . TRANSURETHRAL RESECTION OF BLADDER TUMOR N/A 11/19/2013   Procedure: TRANSURETHRAL RESECTION OF BLADDER TUMOR (TURBT);  Surgeon: [name], MD;  [facility];  Service: Urology;  Laterality: N/A;  . URETEROSCOPY  01/27/2012   Procedure: URETEROSCOPY;  Surgeon: [name], MD;  [facility];  Service: Urology;  Laterality: Left;  CYSTO, Left RETROGRADE PYELOGRPHY, LEFT URETEROSCOPY   . URINARY SPHINCTER IMPLANT    . VARICOSE VEIN SURGERY      Family History  Problem Relation Age of Onset  . Colon cancer Neg Hx   . Colon polyps Neg Hx     No Known Allergies  Current Outpatient Medications on File Prior to Visit  Medication Sig Dispense Refill  . amLODipine (NORVASC) 2.5 MG tablet TAKE 1 TABLET BY MOUTH EVERY DAY 90 tablet 1  . B Complex-C (B-COMPLEX  WITH VITAMIN C) tablet Take 1 tablet by mouth daily. Reported on 07/24/2015 90 tablet 3  . cholecalciferol (VITAMIN D) 1000 units tablet Take 1 tablet (1,000 Units total) by mouth daily. 90 tablet 3  . KLOR-CON M10 10 MEQ tablet TAKE 1 TABLET BY MOUTH EVERY DAY 90 tablet 1  . methylcellulose (ARTIFICIAL TEARS) 1 % ophthalmic solution Place 1 drop into both eyes 2 (two) times daily.     . mirabegron ER (MYRBETRIQ) 50 MG TB24 tablet Take 1 tablet (50 mg total) by mouth daily. 90 tablet 0  . Multiple Vitamins-Minerals (PRESERVISION/LUTEIN PO) Take 1 tablet by mouth daily.    [name]  UNABLE TO FIND diosmin    . apixaban (ELIQUIS) 2.5 MG TABS tablet Take 1 tablet (2.5 mg total) by mouth 2 (two) times daily. 180 tablet 0  . furosemide (LASIX) 40 MG tablet Take 1 tablet (40 mg total) by mouth daily. 90 tablet 3  . metoprolol succinate (TOPROL-XL) 25 MG 24 hr tablet Take 1 tablet (25 mg total) by mouth at bedtime. 90 tablet 0  . pravastatin (PRAVACHOL) 20 MG tablet Take 1 tablet (20 mg total) by mouth at bedtime. 90 tablet 0   No current facility-administered medications on file prior to visit.     BP 114/70   Temp 97.7 F (36.5 C)   Wt 131 lb (59.4 kg)   BMI 20.52 kg/m       Objective:   Physical Exam Vitals signs and nursing note reviewed.  Constitutional:      Appearance: Normal appearance.  HENT:     Mouth/Throat:     Mouth: Mucous membranes are moist.     Pharynx: Oropharynx is clear.  Cardiovascular:     Rate and Rhythm: Normal rate. Rhythm irregularly irregular.  Pulmonary:     Effort: Pulmonary effort is normal.     Breath sounds: Normal breath sounds.  Neurological:     General: No focal deficit present.     Mental Status: He is alert and oriented to person, place, and time.  Psychiatric:        Mood and Affect: Mood normal.        Behavior: Behavior normal.        Thought Content: Thought content normal.        Judgment: Judgment normal.       Assessment & Plan:  We  discussed his most recent echo results in details. Reiterated recommendations by Dr. Cathie Olden of low salt diet and to continue lasix  [name], NP

## 2018-04-27 ENCOUNTER — Ambulatory Visit (INDEPENDENT_AMBULATORY_CARE_PROVIDER_SITE_OTHER)
Admission: RE | Admit: 2018-04-27 | Discharge: 2018-04-27 | Disposition: A | Discharge status: Home / Self Care | Payer: Medicare Other | Source: Ambulatory Visit | Attending: Adult Health | Admitting: Adult Health

## 2018-04-27 ENCOUNTER — Encounter: Payer: Self-pay | Admitting: Family Medicine

## 2018-04-27 DIAGNOSIS — R1033 Periumbilical pain: Secondary | ICD-10-CM | POA: Diagnosis not present

## 2018-04-27 DIAGNOSIS — K5730 Diverticulosis of large intestine without perforation or abscess without bleeding: Secondary | ICD-10-CM | POA: Diagnosis not present

## 2018-04-27 MED ORDER — IOPAMIDOL (ISOVUE-300) INJECTION 61%
100.0000 mL | Freq: Once | INTRAVENOUS | Status: AC | PRN
  Administered 2018-04-27: 100 mL via INTRAVENOUS

## 2018-04-28 DIAGNOSIS — M545 Low back pain: Secondary | ICD-10-CM | POA: Diagnosis not present

## 2018-04-28 DIAGNOSIS — R2681 Unsteadiness on feet: Secondary | ICD-10-CM | POA: Diagnosis not present

## 2018-04-28 DIAGNOSIS — R2689 Other abnormalities of gait and mobility: Secondary | ICD-10-CM | POA: Diagnosis not present

## 2018-04-28 NOTE — Telephone Encounter (Signed)
Spoke to patient and informed him of his abdominal CT. IMPRESSION: 1. Approximately 2.5 cm thin linear ingested radiopaque foreign body  He is unsure of what he swallowed. Will have him come in next week and have a KUB done.   Also advised increase water and fiber as his stool burden is likely causing his pain.   Encouraged that if he has any sharp abdominal pain, starts to have a fever, or feeling acutely ill then to go to the ER

## 2018-05-01 DIAGNOSIS — R2681 Unsteadiness on feet: Secondary | ICD-10-CM | POA: Diagnosis not present

## 2018-05-01 DIAGNOSIS — M545 Low back pain: Secondary | ICD-10-CM | POA: Diagnosis not present

## 2018-05-01 DIAGNOSIS — R2689 Other abnormalities of gait and mobility: Secondary | ICD-10-CM | POA: Diagnosis not present

## 2018-05-02 ENCOUNTER — Ambulatory Visit: Payer: Medicare Other | Admitting: Adult Health

## 2018-05-03 DIAGNOSIS — R2689 Other abnormalities of gait and mobility: Secondary | ICD-10-CM | POA: Diagnosis not present

## 2018-05-03 DIAGNOSIS — R2681 Unsteadiness on feet: Secondary | ICD-10-CM | POA: Diagnosis not present

## 2018-05-03 DIAGNOSIS — M545 Low back pain: Secondary | ICD-10-CM | POA: Diagnosis not present

## 2018-05-05 ENCOUNTER — Ambulatory Visit (INDEPENDENT_AMBULATORY_CARE_PROVIDER_SITE_OTHER): Payer: Medicare Other | Admitting: Adult Health

## 2018-05-05 ENCOUNTER — Ambulatory Visit (INDEPENDENT_AMBULATORY_CARE_PROVIDER_SITE_OTHER): Payer: Medicare Other

## 2018-05-05 ENCOUNTER — Encounter: Payer: Self-pay | Admitting: Adult Health

## 2018-05-05 VITALS — BP 102/50 | HR 52 | Temp 97.6°F | Ht 67.0 in | Wt 132.2 lb

## 2018-05-05 DIAGNOSIS — K5901 Slow transit constipation: Secondary | ICD-10-CM

## 2018-05-05 DIAGNOSIS — R4181 Age-related cognitive decline: Secondary | ICD-10-CM | POA: Diagnosis not present

## 2018-05-05 DIAGNOSIS — R935 Abnormal findings on diagnostic imaging of other abdominal regions, including retroperitoneum: Secondary | ICD-10-CM

## 2018-05-05 DIAGNOSIS — K5730 Diverticulosis of large intestine without perforation or abscess without bleeding: Secondary | ICD-10-CM | POA: Diagnosis not present

## 2018-05-05 NOTE — Progress Notes (Signed)
Subjective:    Patient ID: Jeremy Kelley, male    DOB: 02-Dec-1926, 85 y.o.   MRN: [id number]  HPI 85 year old male who  has a past medical history of Arthritis, Carotid artery stenosis (11/2008), Colon polyps, Diverticulosis, Hepatitis, Hyperlipidemia, Hypertension, Numbness, Phimosis, Prostate cancer ([place]) (2001), and Urothelial carcinoma of left distal ureter ([place]).  He presents to the office today for follow up after recent CT of abdomen that was done due to sharp intermittent abdominal pain. His CT showed:   IMPRESSION: 1. Approximately 2.5 cm thin linear ingested radiopaque foreign body within a nondilated loop of small bowel within the left mid hemiabdomen, not resulting in enteric obstruction. As this appears to be seen on the provided scout radiograph, follow-up abdominal radiograph could be performed to ensure passage as deemed clinically appropriate. 2. Otherwise, no explanation for patient's periumbilical abdominal pain. 3. Large colonic stool burden without evidence of enteric obstruction. 4. Colonic diverticulosis without evidence of diverticulitis. 5. Suspected hepatic steatosis.  Correlation with LFTs is advised. 6. Cardiomegaly, in particular, there is marked enlargement of the right atrium. 7. Emphysema (ICD10-J43.9). 8.  Aortic Atherosclerosis (ICD10-I70.0).  We will due an xray of abdomen today to make sure foreign body has been eliminated.   His other concern is that of increasing but chronic memory issues. He is not getting lost while driving but reports that he often forgets what he is talking about during a conversation and is having difficulty with word finding issues. He has not forgotten where he placed his keys or wallet at home.  Review of Systems See HPI   Past Medical History:  Diagnosis Date  . Arthritis   . Carotid artery stenosis 11/2008   bilateral 40-59% stenosis  . Colon polyps   . Diverticulosis   . Hepatitis    doesn't know which  type 1969  . Hyperlipidemia   . Hypertension   . Numbness    fingertips  . Phimosis   . Prostate cancer ([place]) 2001  . Urothelial carcinoma of left distal ureter [facility])     Social History   Socioeconomic History  . Marital status: Single    Spouse name: Not on file  . Number of children: 0  . Years of education: Not on file  . Highest education level: Not on file  Occupational History  . Occupation: Retired  Scientific laboratory technician  . Financial resource strain: Not on file  . Food insecurity:    Worry: Not on file    Inability: Not on file  . Transportation needs:    Medical: Not on file    Non-medical: Not on file  Tobacco Use  . Smoking status: Former Research scientist (life sciences)  . Smokeless tobacco: Never Used  . Tobacco comment: 07/29/2015 "might smoke a cigar once/year"  Substance and Sexual Activity  . Alcohol use: Yes    Alcohol/week: 14.0 standard drinks    Types: 14 Glasses of wine per week    Comment: 07/29/2015 "big glass of wine q night"  . Drug use: No  . Sexual activity: Not on file  Lifestyle  . Physical activity:    Days per week: Not on file    Minutes per session: Not on file  . Stress: Not on file  Relationships  . Social connections:    Talks on phone: Not on file    Gets together: Not on file    Attends religious service: Not on file    Active member of club or organization: Not  on file    Attends meetings of clubs or organizations: Not on file    Relationship status: Not on file  . Intimate partner violence:    Fear of current or ex partner: Not on file    Emotionally abused: Not on file    Physically abused: Not on file    Forced sexual activity: Not on file  Other Topics Concern  . Not on file  Social History Narrative   Patient is single, has never been married.  Does not have any children.  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   He drinks approximately 3-4 glasses of wine per week.  No history of excessive alcohol use.  Occasionally smokes a  cigar.   Spends winters in [place]         Past Surgical History:  Procedure Laterality Date  . BIOPSY  10/01/2011   Procedure: BIOPSY;  Surgeon: [name], MD;  [facility];  Service: Urology;;  biopsy of bladder tumor  . BLADDER SUSPENSION  08/18/10   [name]  . CARDIAC CATHETERIZATION N/A 07/29/2015   Procedure: Right/Left Heart Cath and Coronary Angiography;  Surgeon: Peter M Martinique, MD;  Location: [facility];  Service: Cardiovascular;  Laterality: N/A;  . CATARACT EXTRACTION W/ INTRAOCULAR LENS  IMPLANT, BILATERAL Bilateral   . CYSTOSCOPY W/ RETROGRADES  01/27/2012   Procedure: CYSTOSCOPY WITH RETROGRADE PYELOGRAM;  Surgeon: [name], MD;  [facility];  Service: Urology;  Laterality: Left;  . CYSTOSCOPY WITH RETROGRADE PYELOGRAM, URETEROSCOPY AND STENT PLACEMENT Left 11/24/2012   Procedure: CYSTOSCOPY WITH left RETROGRADE PYELOGRAM, bladder washings, ureteroscopy;  Surgeon: [name], MD;  [facility];  Service: Urology;  Laterality: Left;  . CYSTOSCOPY WITH RETROGRADE PYELOGRAM, URETEROSCOPY AND STENT PLACEMENT Bilateral 11/19/2013   Procedure: CYSTOSCOPY WITH BILATERAL RETROGRADE PYELOGRAM,;  Surgeon: [name], MD;  [facility];  Service: Urology;  Laterality: Bilateral;  . EYE SURGERY  09/24/10   left eye. "Retina surgery"  . INGUINAL HERNIA REPAIR  1960  . PROSTATE SURGERY     s/p laparoscopic prostate surgery  . SKIN BIOPSY  05/20/2017   Superficial and nodular basal cell carcinoma (left temple)    basal cell carcinoma with sclerosis (right temple)  . SKIN CANCER EXCISION     a/p skin cancer right foot-non melanoma  . skin laceration    . TEE WITHOUT CARDIOVERSION N/A 07/29/2015   Procedure: TRANSESOPHAGEAL ECHOCARDIOGRAM (TEE);  Surgeon: Pixie Casino, MD;  Location: [facility] ENDOSCOPY;  Service: Cardiovascular;  Laterality: N/A;  cath to follow  . TRANSURETHRAL RESECTION OF BLADDER TUMOR  10/01/2011   Procedure: TRANSURETHRAL RESECTION OF  BLADDER TUMOR (TURBT);  Surgeon: [name], MD;  [facility];  Service: Urology;  Laterality: N/A;  . TRANSURETHRAL RESECTION OF BLADDER TUMOR N/A 11/19/2013   Procedure: TRANSURETHRAL RESECTION OF BLADDER TUMOR (TURBT);  Surgeon: [name], MD;  [facility];  Service: Urology;  Laterality: N/A;  . URETEROSCOPY  01/27/2012   Procedure: URETEROSCOPY;  Surgeon: [name], MD;  [facility];  Service: Urology;  Laterality: Left;  CYSTO, Left RETROGRADE PYELOGRPHY, LEFT URETEROSCOPY   . URINARY SPHINCTER IMPLANT    . VARICOSE VEIN SURGERY      Family History  Problem Relation Age of Onset  . Colon cancer Neg Hx   . Colon polyps Neg Hx     No Known Allergies  Current Outpatient Medications on File Prior to Visit  Medication Sig Dispense Refill  .  amLODipine (NORVASC) 2.5 MG tablet TAKE 1 TABLET BY MOUTH EVERY DAY 90 tablet 1  . B Complex-C (B-COMPLEX WITH VITAMIN C) tablet Take 1 tablet by mouth daily. Reported on 07/24/2015 90 tablet 3  . cholecalciferol (VITAMIN D) 1000 units tablet Take 1 tablet (1,000 Units total) by mouth daily. 90 tablet 3  . KLOR-CON M10 10 MEQ tablet TAKE 1 TABLET BY MOUTH EVERY DAY 90 tablet 1  . methylcellulose (ARTIFICIAL TEARS) 1 % ophthalmic solution Place 1 drop into both eyes 2 (two) times daily.     . Multiple Vitamins-Minerals (PRESERVISION/LUTEIN PO) Take 1 tablet by mouth daily.    [name] oxybutynin (DITROPAN-XL) 10 MG 24 hr tablet Take 10 mg by mouth at bedtime.    . solifenacin (VESICARE) 5 MG tablet Take 5 mg by mouth daily.    [name] UNABLE TO FIND diosmin    . apixaban (ELIQUIS) 2.5 MG TABS tablet Take 1 tablet (2.5 mg total) by mouth 2 (two) times daily. 180 tablet 0  . furosemide (LASIX) 40 MG tablet Take 1 tablet (40 mg total) by mouth daily. 90 tablet 3  . metoprolol succinate (TOPROL-XL) 25 MG 24 hr tablet Take 1 tablet (25 mg total) by mouth at bedtime. 90 tablet 0  . pravastatin (PRAVACHOL) 20 MG tablet Take 1 tablet (20 mg total) by  mouth at bedtime. 90 tablet 0   No current facility-administered medications on file prior to visit.     BP (!) 102/50 (BP Location: Left Arm, Patient Position: Sitting, Cuff Size: Normal)   Pulse (!) 52   Temp 97.6 F (36.4 C) (Oral)   Ht 5\' 7"  (1.702 m)   Wt 132 lb 3.2 oz (60 kg)   BMI 20.71 kg/m       Objective:   Physical Exam Vitals signs and nursing note reviewed.  Constitutional:      Appearance: Normal appearance.  Neck:     Musculoskeletal: Normal range of motion and neck supple.  Cardiovascular:     Rate and Rhythm: Normal rate and regular rhythm.     Pulses: Normal pulses.     Heart sounds: Normal heart sounds.  Pulmonary:     Effort: Pulmonary effort is normal.     Breath sounds: Normal breath sounds.  Neurological:     General: No focal deficit present.     Mental Status: He is alert and oriented to person, place, and time.     Cranial Nerves: No cranial nerve deficit.     Sensory: No sensory deficit.     Motor: No weakness.     Coordination: Coordination normal.     Gait: Gait normal.     Deep Tendon Reflexes: Reflexes normal.  Psychiatric:        Mood and Affect: Mood normal.        Behavior: Behavior normal.        Thought Content: Thought content normal.        Judgment: Judgment normal.       Assessment & Plan:  1. Abnormal CT of the abdomen - Reviewed CT findings  - Will get KUB today to look and see if foreign body has passes.   2. Slow transit constipation - Advised to increase fluid intake and add fiber supplement daily   3. Age-related cognitive decline - Likely age related.  - MR Brain Wo Contrast; Future - Consider referral to Neurology   [name], NP

## 2018-05-08 DIAGNOSIS — H0100A Unspecified blepharitis right eye, upper and lower eyelids: Secondary | ICD-10-CM | POA: Diagnosis not present

## 2018-05-08 DIAGNOSIS — H04123 Dry eye syndrome of bilateral lacrimal glands: Secondary | ICD-10-CM | POA: Diagnosis not present

## 2018-05-08 DIAGNOSIS — H0100B Unspecified blepharitis left eye, upper and lower eyelids: Secondary | ICD-10-CM | POA: Diagnosis not present

## 2018-05-10 DIAGNOSIS — R2681 Unsteadiness on feet: Secondary | ICD-10-CM | POA: Diagnosis not present

## 2018-05-10 DIAGNOSIS — M545 Low back pain: Secondary | ICD-10-CM | POA: Diagnosis not present

## 2018-05-10 DIAGNOSIS — R2689 Other abnormalities of gait and mobility: Secondary | ICD-10-CM | POA: Diagnosis not present

## 2018-05-12 ENCOUNTER — Ambulatory Visit (INDEPENDENT_AMBULATORY_CARE_PROVIDER_SITE_OTHER): Payer: Medicare Other | Admitting: Adult Health

## 2018-05-12 ENCOUNTER — Encounter: Payer: Self-pay | Admitting: Adult Health

## 2018-05-12 VITALS — BP 96/46 | Temp 98.3°F | Wt 133.0 lb

## 2018-05-12 DIAGNOSIS — I952 Hypotension due to drugs: Secondary | ICD-10-CM

## 2018-05-12 DIAGNOSIS — R4189 Other symptoms and signs involving cognitive functions and awareness: Secondary | ICD-10-CM | POA: Diagnosis not present

## 2018-05-12 NOTE — Progress Notes (Signed)
Subjective:    Patient ID: Jeremy Kelley, male    DOB: January 31, 1927, 85 y.o.   MRN: [id number]  HPI 85 year old male who  has a past medical history of Arthritis, Carotid artery stenosis (11/2008), Colon polyps, Diverticulosis, Hepatitis, Hyperlipidemia, Hypertension, Numbness, Phimosis, Prostate cancer ([place]) (2001), and Urothelial carcinoma of left distal ureter ([place]).  He presents to the office today for follow up regarding memory issue. He was seen last week for this concern of increasing chronic memory issues. He feels as though his symptoms have not gotten any worse over the last week but have not improved either. He continues to deny getting driving while lost but will often forget what he is discussing during a conversation.   MRI of the head was ordered last week but he has not been scheduled yet. It appears that all information has been sent to [facility] and they are just waiting to get him scheduled   Review of Systems See HPI   Past Medical History:  Diagnosis Date  . Arthritis   . Carotid artery stenosis 11/2008   bilateral 40-59% stenosis  . Colon polyps   . Diverticulosis   . Hepatitis    doesn't know which type 1969  . Hyperlipidemia   . Hypertension   . Numbness    fingertips  . Phimosis   . Prostate cancer ([name]) 2001  . Urothelial carcinoma of left distal ureter [facility])     Social History   Socioeconomic History  . Marital status: Single    Spouse name: Not on file  . Number of children: 0  . Years of education: Not on file  . Highest education level: Not on file  Occupational History  . Occupation: Retired  Scientific laboratory technician  . Financial resource strain: Not on file  . Food insecurity:    Worry: Not on file    Inability: Not on file  . Transportation needs:    Medical: Not on file    Non-medical: Not on file  Tobacco Use  . Smoking status: Former Research scientist (life sciences)  . Smokeless tobacco: Never Used  . Tobacco comment: 07/29/2015 "might smoke a cigar  once/year"  Substance and Sexual Activity  . Alcohol use: Yes    Alcohol/week: 14.0 standard drinks    Types: 14 Glasses of wine per week    Comment: 07/29/2015 "big glass of wine q night"  . Drug use: No  . Sexual activity: Not on file  Lifestyle  . Physical activity:    Days per week: Not on file    Minutes per session: Not on file  . Stress: Not on file  Relationships  . Social connections:    Talks on phone: Not on file    Gets together: Not on file    Attends religious service: Not on file    Active member of club or organization: Not on file    Attends meetings of clubs or organizations: Not on file    Relationship status: Not on file  . Intimate partner violence:    Fear of current or ex partner: Not on file    Emotionally abused: Not on file    Physically abused: Not on file    Forced sexual activity: Not on file  Other Topics Concern  . Not on file  Social History Narrative   Patient is single, has never been married.  Does not have any children.  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   He drinks  approximately 3-4 glasses of wine per week.  No history of excessive alcohol use.  Occasionally smokes a cigar.   Spends winters in [place]         Past Surgical History:  Procedure Laterality Date  . BIOPSY  10/01/2011   Procedure: BIOPSY;  Surgeon: [name], MD;  Location: [facility];  Service: Urology;;  biopsy of bladder tumor  . BLADDER SUSPENSION  08/18/10   [name]  . CARDIAC CATHETERIZATION N/A 07/29/2015   Procedure: Right/Left Heart Cath and Coronary Angiography;  Surgeon: Peter M Martinique, MD;  Location: [place] [facility];  Service: Cardiovascular;  Laterality: N/A;  . CATARACT EXTRACTION W/ INTRAOCULAR LENS  IMPLANT, BILATERAL Bilateral   . CYSTOSCOPY W/ RETROGRADES  01/27/2012   Procedure: CYSTOSCOPY WITH RETROGRADE PYELOGRAM;  Surgeon: [name], MD;  [facility];  Service: Urology;  Laterality: Left;  . CYSTOSCOPY WITH RETROGRADE  PYELOGRAM, URETEROSCOPY AND STENT PLACEMENT Left 11/24/2012   Procedure: CYSTOSCOPY WITH left RETROGRADE PYELOGRAM, bladder washings, ureteroscopy;  Surgeon: [name], MD;  [facility];  Service: Urology;  Laterality: Left;  . CYSTOSCOPY WITH RETROGRADE PYELOGRAM, URETEROSCOPY AND STENT PLACEMENT Bilateral 11/19/2013   Procedure: CYSTOSCOPY WITH BILATERAL RETROGRADE PYELOGRAM,;  Surgeon: [name], MD;  [facility];  Service: Urology;  Laterality: Bilateral;  . EYE SURGERY  09/24/10   left eye. "Retina surgery"  . INGUINAL HERNIA REPAIR  1960  . PROSTATE SURGERY     s/p laparoscopic prostate surgery  . SKIN BIOPSY  05/20/2017   Superficial and nodular basal cell carcinoma (left temple)    basal cell carcinoma with sclerosis (right temple)  . SKIN CANCER EXCISION     a/p skin cancer right foot-non melanoma  . skin laceration    . TEE WITHOUT CARDIOVERSION N/A 07/29/2015   Procedure: TRANSESOPHAGEAL ECHOCARDIOGRAM (TEE);  Surgeon: Pixie Casino, MD;  Location: [facility] ENDOSCOPY;  Service: Cardiovascular;  Laterality: N/A;  cath to follow  . TRANSURETHRAL RESECTION OF BLADDER TUMOR  10/01/2011   Procedure: TRANSURETHRAL RESECTION OF BLADDER TUMOR (TURBT);  Surgeon: [name], MD;  [facility];  Service: Urology;  Laterality: N/A;  . TRANSURETHRAL RESECTION OF BLADDER TUMOR N/A 11/19/2013   Procedure: TRANSURETHRAL RESECTION OF BLADDER TUMOR (TURBT);  Surgeon: [name], MD;  [facility];  Service: Urology;  Laterality: N/A;  . URETEROSCOPY  01/27/2012   Procedure: URETEROSCOPY;  Surgeon: [name], MD;  [facility];  Service: Urology;  Laterality: Left;  CYSTO, Left RETROGRADE PYELOGRPHY, LEFT URETEROSCOPY   . URINARY SPHINCTER IMPLANT    . VARICOSE VEIN SURGERY      Family History  Problem Relation Age of Onset  . Colon cancer Neg Hx   . Colon polyps Neg Hx     No Known Allergies  Current Outpatient Medications on File Prior to Visit  Medication Sig  Dispense Refill  . B Complex-C (B-COMPLEX WITH VITAMIN C) tablet Take 1 tablet by mouth daily. Reported on 07/24/2015 90 tablet 3  . cholecalciferol (VITAMIN D) 1000 units tablet Take 1 tablet (1,000 Units total) by mouth daily. 90 tablet 3  . KLOR-CON M10 10 MEQ tablet TAKE 1 TABLET BY MOUTH EVERY DAY 90 tablet 1  . methylcellulose (ARTIFICIAL TEARS) 1 % ophthalmic solution Place 1 drop into both eyes 2 (two) times daily.     . Multiple Vitamins-Minerals (PRESERVISION/LUTEIN PO) Take 1 tablet by mouth daily.    [name] oxybutynin (DITROPAN-XL) 10 MG 24 hr tablet Take 10 mg by  mouth at bedtime.    . solifenacin (VESICARE) 5 MG tablet Take 5 mg by mouth daily.    [name] UNABLE TO FIND diosmin    . apixaban (ELIQUIS) 2.5 MG TABS tablet Take 1 tablet (2.5 mg total) by mouth 2 (two) times daily. 180 tablet 0  . furosemide (LASIX) 40 MG tablet Take 1 tablet (40 mg total) by mouth daily. 90 tablet 3  . metoprolol succinate (TOPROL-XL) 25 MG 24 hr tablet Take 1 tablet (25 mg total) by mouth at bedtime. 90 tablet 0  . pravastatin (PRAVACHOL) 20 MG tablet Take 1 tablet (20 mg total) by mouth at bedtime. 90 tablet 0   No current facility-administered medications on file prior to visit.     BP (!) 96/46   Temp 98.3 F (36.8 C)   Wt 133 lb (60.3 kg)   BMI 20.83 kg/m       Objective:   Physical Exam Vitals signs and nursing note reviewed.  Constitutional:      Appearance: Normal appearance. He is underweight.  HENT:     Head: Normocephalic and atraumatic.  Cardiovascular:     Rate and Rhythm: Rhythm irregular.     Pulses: Normal pulses.     Heart sounds: Normal heart sounds.  Pulmonary:     Effort: Pulmonary effort is normal.     Breath sounds: Normal breath sounds.  Skin:    Capillary Refill: Capillary refill takes less than 2 seconds.  Neurological:     General: No focal deficit present.     Mental Status: He is alert. Mental status is at baseline.        Assessment & Plan:  1. Hypotension  due to drugs - asymptomatic  - BP low today and has been trending down.  - Will d/c Norvasc.    2. Cognitive impairment - Advised to let me know if he has not heard anything about scheduling MRI by the middle of next week    [name], NP

## 2018-05-17 DIAGNOSIS — R2681 Unsteadiness on feet: Secondary | ICD-10-CM | POA: Diagnosis not present

## 2018-05-17 DIAGNOSIS — M545 Low back pain: Secondary | ICD-10-CM | POA: Diagnosis not present

## 2018-05-17 DIAGNOSIS — R2689 Other abnormalities of gait and mobility: Secondary | ICD-10-CM | POA: Diagnosis not present

## 2018-05-19 DIAGNOSIS — R2689 Other abnormalities of gait and mobility: Secondary | ICD-10-CM | POA: Diagnosis not present

## 2018-05-19 DIAGNOSIS — M545 Low back pain: Secondary | ICD-10-CM | POA: Diagnosis not present

## 2018-05-19 DIAGNOSIS — R2681 Unsteadiness on feet: Secondary | ICD-10-CM | POA: Diagnosis not present

## 2018-05-24 ENCOUNTER — Ambulatory Visit
Admission: RE | Admit: 2018-05-24 | Discharge: 2018-05-24 | Disposition: A | Discharge status: Home / Self Care | Payer: Medicare Other | Source: Ambulatory Visit | Attending: Adult Health | Admitting: Adult Health

## 2018-05-24 DIAGNOSIS — I6782 Cerebral ischemia: Secondary | ICD-10-CM | POA: Diagnosis not present

## 2018-05-24 DIAGNOSIS — R4181 Age-related cognitive decline: Secondary | ICD-10-CM

## 2018-05-26 ENCOUNTER — Ambulatory Visit (INDEPENDENT_AMBULATORY_CARE_PROVIDER_SITE_OTHER): Payer: Medicare Other | Admitting: Adult Health

## 2018-05-26 ENCOUNTER — Encounter: Payer: Self-pay | Admitting: Adult Health

## 2018-05-26 VITALS — BP 106/54 | Temp 98.0°F | Wt 126.0 lb

## 2018-05-26 DIAGNOSIS — H0100B Unspecified blepharitis left eye, upper and lower eyelids: Secondary | ICD-10-CM | POA: Diagnosis not present

## 2018-05-26 DIAGNOSIS — H0100A Unspecified blepharitis right eye, upper and lower eyelids: Secondary | ICD-10-CM | POA: Diagnosis not present

## 2018-05-26 DIAGNOSIS — R419 Unspecified symptoms and signs involving cognitive functions and awareness: Secondary | ICD-10-CM | POA: Diagnosis not present

## 2018-05-26 DIAGNOSIS — H04123 Dry eye syndrome of bilateral lacrimal glands: Secondary | ICD-10-CM | POA: Diagnosis not present

## 2018-05-26 DIAGNOSIS — H532 Diplopia: Secondary | ICD-10-CM | POA: Diagnosis not present

## 2018-05-26 NOTE — Progress Notes (Signed)
Subjective:    Patient ID: Jeremy Kelley, male    DOB: [birth year], 85 y.o.   MRN: [id number]  HPI  85 year old male who  has a past medical history of Arthritis, Carotid artery stenosis (11/2008), Colon polyps, Diverticulosis, Hepatitis, Hyperlipidemia, Hypertension, Numbness, Phimosis, Prostate cancer ([place]) (2001), and Urothelial carcinoma of left distal ureter ([place]).  He presents to the office today for follow up after recent MRI of brain for perceived cognitive decline. He feels as he will often forgets what he is talking about in the middle the a sentence. He is not getting lost while driving and not forgetting family members names.   He is reading and playing brain games, stays active, and takes a B complex vitamin  IMPRESSION: 1. No acute intracranial abnormality. 2. Generalized cerebral atrophy and mild-to-moderate chronic small vessel ischemic disease   Review of Systems See HPI   Past Medical History:  Diagnosis Date  . Arthritis   . Carotid artery stenosis 11/2008   bilateral 40-59% stenosis  . Colon polyps   . Diverticulosis   . Hepatitis    doesn't know which type 1969  . Hyperlipidemia   . Hypertension   . Numbness    fingertips  . Phimosis   . Prostate cancer ([place]) 2001  . Urothelial carcinoma of left distal ureter [facility])     Social History   Socioeconomic History  . Marital status: Single    Spouse name: Not on file  . Number of children: 0  . Years of education: Not on file  . Highest education level: Not on file  Occupational History  . Occupation: Retired  Scientific laboratory technician  . Financial resource strain: Not on file  . Food insecurity:    Worry: Not on file    Inability: Not on file  . Transportation needs:    Medical: Not on file    Non-medical: Not on file  Tobacco Use  . Smoking status: Former Research scientist (life sciences)  . Smokeless tobacco: Never Used  . Tobacco comment: 07/29/2015 "might smoke a cigar once/year"  Substance and Sexual Activity  . Alcohol  use: Yes    Alcohol/week: 14.0 standard drinks    Types: 14 Glasses of wine per week    Comment: 07/29/2015 "big glass of wine q night"  . Drug use: No  . Sexual activity: Not on file  Lifestyle  . Physical activity:    Days per week: Not on file    Minutes per session: Not on file  . Stress: Not on file  Relationships  . Social connections:    Talks on phone: Not on file    Gets together: Not on file    Attends religious service: Not on file    Active member of club or organization: Not on file    Attends meetings of clubs or organizations: Not on file    Relationship status: Not on file  . Intimate partner violence:    Fear of current or ex partner: Not on file    Emotionally abused: Not on file    Physically abused: Not on file    Forced sexual activity: Not on file  Other Topics Concern  . Not on file  Social History Narrative   Patient is single, has never been married.  Does not have any children.  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   He drinks approximately 3-4 glasses of wine per week.  No history of excessive alcohol use.  Occasionally  smokes a cigar.   Spends winters in [place]         Past Surgical History:  Procedure Laterality Date  . BIOPSY  10/01/2011   Procedure: BIOPSY;  Surgeon: [name], MD;  [facility];  Service: Urology;;  biopsy of bladder tumor  . BLADDER SUSPENSION  08/18/10   [name]  . CARDIAC CATHETERIZATION N/A 07/29/2015   Procedure: Right/Left Heart Cath and Coronary Angiography;  Surgeon: Peter M Martinique, MD;  Location: [place] [facility];  Service: Cardiovascular;  Laterality: N/A;  . CATARACT EXTRACTION W/ INTRAOCULAR LENS  IMPLANT, BILATERAL Bilateral   . CYSTOSCOPY W/ RETROGRADES  01/27/2012   Procedure: CYSTOSCOPY WITH RETROGRADE PYELOGRAM;  Surgeon: [name], MD;  [facility];  Service: Urology;  Laterality: Left;  . CYSTOSCOPY WITH RETROGRADE PYELOGRAM, URETEROSCOPY AND STENT PLACEMENT Left  11/24/2012   Procedure: CYSTOSCOPY WITH left RETROGRADE PYELOGRAM, bladder washings, ureteroscopy;  Surgeon: [name], MD;  [facility];  Service: Urology;  Laterality: Left;  . CYSTOSCOPY WITH RETROGRADE PYELOGRAM, URETEROSCOPY AND STENT PLACEMENT Bilateral 11/19/2013   Procedure: CYSTOSCOPY WITH BILATERAL RETROGRADE PYELOGRAM,;  Surgeon: [name], MD;  [facility];  Service: Urology;  Laterality: Bilateral;  . EYE SURGERY  09/24/10   left eye. "Retina surgery"  . INGUINAL HERNIA REPAIR  1960  . PROSTATE SURGERY     s/p laparoscopic prostate surgery  . SKIN BIOPSY  05/20/2017   Superficial and nodular basal cell carcinoma (left temple)    basal cell carcinoma with sclerosis (right temple)  . SKIN CANCER EXCISION     a/p skin cancer right foot-non melanoma  . skin laceration    . TEE WITHOUT CARDIOVERSION N/A 07/29/2015   Procedure: TRANSESOPHAGEAL ECHOCARDIOGRAM (TEE);  Surgeon: Pixie Casino, MD;  Location: [facility] ENDOSCOPY;  Service: Cardiovascular;  Laterality: N/A;  cath to follow  . TRANSURETHRAL RESECTION OF BLADDER TUMOR  10/01/2011   Procedure: TRANSURETHRAL RESECTION OF BLADDER TUMOR (TURBT);  Surgeon: [name], MD;  [facility];  Service: Urology;  Laterality: N/A;  . TRANSURETHRAL RESECTION OF BLADDER TUMOR N/A 11/19/2013   Procedure: TRANSURETHRAL RESECTION OF BLADDER TUMOR (TURBT);  Surgeon: [name], MD;  [facility];  Service: Urology;  Laterality: N/A;  . URETEROSCOPY  01/27/2012   Procedure: URETEROSCOPY;  Surgeon: [name], MD;  [facility];  Service: Urology;  Laterality: Left;  CYSTO, Left RETROGRADE PYELOGRPHY, LEFT URETEROSCOPY   . URINARY SPHINCTER IMPLANT    . VARICOSE VEIN SURGERY      Family History  Problem Relation Age of Onset  . Colon cancer Neg Hx   . Colon polyps Neg Hx     No Known Allergies  Current Outpatient Medications on File Prior to Visit  Medication Sig Dispense Refill  . B Complex-C (B-COMPLEX WITH  VITAMIN C) tablet Take 1 tablet by mouth daily. Reported on 07/24/2015 90 tablet 3  . cholecalciferol (VITAMIN D) 1000 units tablet Take 1 tablet (1,000 Units total) by mouth daily. 90 tablet 3  . KLOR-CON M10 10 MEQ tablet TAKE 1 TABLET BY MOUTH EVERY DAY 90 tablet 1  . methylcellulose (ARTIFICIAL TEARS) 1 % ophthalmic solution Place 1 drop into both eyes 2 (two) times daily.     . Multiple Vitamins-Minerals (PRESERVISION/LUTEIN PO) Take 1 tablet by mouth daily.    [name] oxybutynin (DITROPAN-XL) 10 MG 24 hr tablet Take 10 mg by mouth at bedtime.    . solifenacin (VESICARE) 5 MG tablet Take 5 mg by  mouth daily.    [name] UNABLE TO FIND diosmin    . apixaban (ELIQUIS) 2.5 MG TABS tablet Take 1 tablet (2.5 mg total) by mouth 2 (two) times daily. 180 tablet 0  . furosemide (LASIX) 40 MG tablet Take 1 tablet (40 mg total) by mouth daily. 90 tablet 3  . metoprolol succinate (TOPROL-XL) 25 MG 24 hr tablet Take 1 tablet (25 mg total) by mouth at bedtime. 90 tablet 0  . pravastatin (PRAVACHOL) 20 MG tablet Take 1 tablet (20 mg total) by mouth at bedtime. 90 tablet 0   No current facility-administered medications on file prior to visit.     BP (!) 106/54   Temp 98 F (36.7 C)   Wt 126 lb (57.2 kg)   BMI 19.73 kg/m       Objective:   Physical Exam Vitals signs and nursing note reviewed.  Constitutional:      Appearance: Normal appearance.  Cardiovascular:     Rate and Rhythm: Normal rate and regular rhythm.     Pulses: Normal pulses.     Heart sounds: Normal heart sounds.  Pulmonary:     Effort: Pulmonary effort is normal.     Breath sounds: Normal breath sounds.  Musculoskeletal: Normal range of motion.  Skin:    General: Skin is warm and dry.     Capillary Refill: Capillary refill takes less than 2 seconds.  Neurological:     General: No focal deficit present.     Mental Status: He is alert.       Assessment & Plan:  1. Cognitive complaints - We reviewed his MRI in detail and all  questions answered. I reassured him that his MRI showed pretty normal age related changes and that what he was experiencing was not necessarily decreased cognition. We was advised to continue with reading and doing brain games, stay as active and possible, and can continues B vitamins   [name], NP

## 2018-05-31 DIAGNOSIS — R2689 Other abnormalities of gait and mobility: Secondary | ICD-10-CM | POA: Diagnosis not present

## 2018-05-31 DIAGNOSIS — R2681 Unsteadiness on feet: Secondary | ICD-10-CM | POA: Diagnosis not present

## 2018-05-31 DIAGNOSIS — M545 Low back pain: Secondary | ICD-10-CM | POA: Diagnosis not present

## 2018-06-01 DIAGNOSIS — N3946 Mixed incontinence: Secondary | ICD-10-CM | POA: Diagnosis not present

## 2018-06-01 DIAGNOSIS — N481 Balanitis: Secondary | ICD-10-CM | POA: Diagnosis not present

## 2018-06-02 ENCOUNTER — Ambulatory Visit (INDEPENDENT_AMBULATORY_CARE_PROVIDER_SITE_OTHER): Payer: Medicare Other | Admitting: Adult Health

## 2018-06-02 ENCOUNTER — Encounter: Payer: Self-pay | Admitting: Adult Health

## 2018-06-02 VITALS — BP 110/72 | Temp 98.2°F | Wt 126.0 lb

## 2018-06-02 DIAGNOSIS — I482 Chronic atrial fibrillation, unspecified: Secondary | ICD-10-CM

## 2018-06-02 DIAGNOSIS — Z760 Encounter for issue of repeat prescription: Secondary | ICD-10-CM | POA: Diagnosis not present

## 2018-06-02 DIAGNOSIS — E785 Hyperlipidemia, unspecified: Secondary | ICD-10-CM

## 2018-06-02 DIAGNOSIS — I10 Essential (primary) hypertension: Secondary | ICD-10-CM | POA: Diagnosis not present

## 2018-06-02 MED ORDER — APIXABAN 2.5 MG PO TABS: 2.5000 mg | ORAL_TABLET | Freq: Two times a day (BID) | ORAL | 0 refills | Status: DC

## 2018-06-02 MED ORDER — KLOR-CON M10 10 MEQ PO TBCR: 10.0000 meq | EXTENDED_RELEASE_TABLET | Freq: Every day | ORAL | 0 refills | Status: DC

## 2018-06-02 MED ORDER — PRAVASTATIN SODIUM 20 MG PO TABS: 20.0000 mg | ORAL_TABLET | Freq: Every day | ORAL | 0 refills | Status: DC

## 2018-06-02 MED ORDER — FUROSEMIDE 40 MG PO TABS: 40.0000 mg | ORAL_TABLET | Freq: Every day | ORAL | 0 refills | Status: DC

## 2018-06-02 MED ORDER — METOPROLOL SUCCINATE ER 25 MG PO TB24: 25.0000 mg | ORAL_TABLET | Freq: Every day | ORAL | 0 refills | Status: DC

## 2018-06-02 NOTE — Progress Notes (Addendum)
Subjective:    Patient ID: Jeremy Kelley, male    DOB: 01-Dec-1926, 85 y.o.   MRN: [id number]  HPI 85 year old male who  has a past medical history of Arthritis, Carotid artery stenosis (11/2008), Colon polyps, Diverticulosis, Hepatitis, Hyperlipidemia, Hypertension, Numbness, Phimosis, Prostate cancer ([place]) (2001), and Urothelial carcinoma of left distal ureter ([place]).  Presents to the office today for medication review.  He is leaving early next week to go to [place] for 3 months he would like to review his medications before he leaves.   Review of Systems See HPI   Past Medical History:  Diagnosis Date  . Arthritis   . Carotid artery stenosis 11/2008   bilateral 40-59% stenosis  . Colon polyps   . Diverticulosis   . Hepatitis    doesn't know which type 1969  . Hyperlipidemia   . Hypertension   . Numbness    fingertips  . Phimosis   . Prostate cancer ([place]) 2001  . Urothelial carcinoma of left distal ureter [facility])     Social History   Socioeconomic History  . Marital status: Single    Spouse name: Not on file  . Number of children: 0  . Years of education: Not on file  . Highest education level: Not on file  Occupational History  . Occupation: Retired  Scientific laboratory technician  . Financial resource strain: Not on file  . Food insecurity:    Worry: Not on file    Inability: Not on file  . Transportation needs:    Medical: Not on file    Non-medical: Not on file  Tobacco Use  . Smoking status: Former Research scientist (life sciences)  . Smokeless tobacco: Never Used  . Tobacco comment: 07/29/2015 "might smoke a cigar once/year"  Substance and Sexual Activity  . Alcohol use: Yes    Alcohol/week: 14.0 standard drinks    Types: 14 Glasses of wine per week    Comment: 07/29/2015 "big glass of wine q night"  . Drug use: No  . Sexual activity: Not on file  Lifestyle  . Physical activity:    Days per week: Not on file    Minutes per session: Not on file  . Stress: Not on file  Relationships    . Social connections:    Talks on phone: Not on file    Gets together: Not on file    Attends religious service: Not on file    Active member of club or organization: Not on file    Attends meetings of clubs or organizations: Not on file    Relationship status: Not on file  . Intimate partner violence:    Fear of current or ex partner: Not on file    Emotionally abused: Not on file    Physically abused: Not on file    Forced sexual activity: Not on file  Other Topics Concern  . Not on file  Social History Narrative   Patient is single, has never been married.  Does not have any children.  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   He drinks approximately 3-4 glasses of wine per week.  No history of excessive alcohol use.  Occasionally smokes a cigar.   Spends winters in [place]         Past Surgical History:  Procedure Laterality Date  . BIOPSY  10/01/2011   Procedure: BIOPSY;  Surgeon: [name], MD;  Location: [facility];  Service: Urology;;  biopsy of bladder tumor  .  BLADDER SUSPENSION  08/18/10   [name]  . CARDIAC CATHETERIZATION N/A 07/29/2015   Procedure: Right/Left Heart Cath and Coronary Angiography;  Surgeon: Peter M Martinique, MD;  Location: [facility];  Service: Cardiovascular;  Laterality: N/A;  . CATARACT EXTRACTION W/ INTRAOCULAR LENS  IMPLANT, BILATERAL Bilateral   . CYSTOSCOPY W/ RETROGRADES  01/27/2012   Procedure: CYSTOSCOPY WITH RETROGRADE PYELOGRAM;  Surgeon: [name], MD;  Location: [facility];  Service: Urology;  Laterality: Left;  . CYSTOSCOPY WITH RETROGRADE PYELOGRAM, URETEROSCOPY AND STENT PLACEMENT Left 11/24/2012   Procedure: CYSTOSCOPY WITH left RETROGRADE PYELOGRAM, bladder washings, ureteroscopy;  Surgeon: [name], MD;  Location: [facility];  Service: Urology;  Laterality: Left;  . CYSTOSCOPY WITH RETROGRADE PYELOGRAM, URETEROSCOPY AND STENT PLACEMENT Bilateral 11/19/2013   Procedure: CYSTOSCOPY WITH BILATERAL RETROGRADE  PYELOGRAM,;  Surgeon: [name], MD;  Location: [facility];  Service: Urology;  Laterality: Bilateral;  . EYE SURGERY  09/24/10   left eye. "Retina surgery"  . INGUINAL HERNIA REPAIR  1960  . PROSTATE SURGERY     s/p laparoscopic prostate surgery  . SKIN BIOPSY  05/20/2017   Superficial and nodular basal cell carcinoma (left temple)    basal cell carcinoma with sclerosis (right temple)  . SKIN CANCER EXCISION     a/p skin cancer right foot-non melanoma  . skin laceration    . TEE WITHOUT CARDIOVERSION N/A 07/29/2015   Procedure: TRANSESOPHAGEAL ECHOCARDIOGRAM (TEE);  Surgeon: Pixie Casino, MD;  Location: [facility];  Service: Cardiovascular;  Laterality: N/A;  cath to follow  . TRANSURETHRAL RESECTION OF BLADDER TUMOR  10/01/2011   Procedure: TRANSURETHRAL RESECTION OF BLADDER TUMOR (TURBT);  Surgeon: [name], MD;  Location: [facility];  Service: Urology;  Laterality: N/A;  . TRANSURETHRAL RESECTION OF BLADDER TUMOR N/A 11/19/2013   Procedure: TRANSURETHRAL RESECTION OF BLADDER TUMOR (TURBT);  Surgeon: [name], MD;  Location: [facility];  Service: Urology;  Laterality: N/A;  . URETEROSCOPY  01/27/2012   Procedure: URETEROSCOPY;  Surgeon: [name], MD;  Location: [facility];  Service: Urology;  Laterality: Left;  CYSTO, Left RETROGRADE PYELOGRPHY, LEFT URETEROSCOPY   . URINARY SPHINCTER IMPLANT    . VARICOSE VEIN SURGERY      Family History  Problem Relation Age of Onset  . Colon cancer Neg Hx   . Colon polyps Neg Hx     No Known Allergies  Current Outpatient Medications on File Prior to Visit  Medication Sig Dispense Refill  . apixaban (ELIQUIS) 2.5 MG TABS tablet Take 1 tablet (2.5 mg total) by mouth 2 (two) times daily. 180 tablet 0  . B Complex-C (B-COMPLEX WITH VITAMIN C) tablet Take 1 tablet by mouth daily. Reported on 07/24/2015 90 tablet 3  . cholecalciferol (VITAMIN D) 1000 units tablet Take 1 tablet (1,000 Units total) by mouth daily. 90 tablet 3  . furosemide (LASIX) 40  MG tablet Take 1 tablet (40 mg total) by mouth daily. 90 tablet 3  . KLOR-CON M10 10 MEQ tablet TAKE 1 TABLET BY MOUTH EVERY DAY 90 tablet 1  . methylcellulose (ARTIFICIAL TEARS) 1 % ophthalmic solution Place 1 drop into both eyes 2 (two) times daily.     . metoprolol succinate (TOPROL-XL) 25 MG 24 hr tablet Take 1 tablet (25 mg total) by mouth at bedtime. 90 tablet 0  . Multiple Vitamins-Minerals (PRESERVISION/LUTEIN PO) Take 1 tablet by mouth daily.    [name] oxybutynin (DITROPAN-XL) 10 MG 24 hr tablet Take 10 mg by mouth at bedtime.    [name]  pravastatin (PRAVACHOL) 20 MG tablet Take 1 tablet (20 mg total) by mouth at bedtime. 90 tablet 0  . solifenacin (VESICARE) 5 MG tablet Take 5 mg by mouth daily.    [name] UNABLE TO FIND diosmin     No current facility-administered medications on file prior to visit.     There were no vitals taken for this visit.      Objective:   Physical Exam Vitals signs and nursing note reviewed.  Constitutional:      Appearance: Normal appearance. He is underweight.  Cardiovascular:     Rate and Rhythm: Normal rate. Rhythm regularly irregular.     Pulses: Normal pulses.     Heart sounds: Normal heart sounds.  Pulmonary:     Effort: Pulmonary effort is normal.     Breath sounds: Normal breath sounds.  Abdominal:     General: Abdomen is flat. Bowel sounds are normal.  Musculoskeletal:     Right lower leg: No edema.     Left lower leg: No edema.  Skin:    General: Skin is warm and dry.  Neurological:     General: No focal deficit present.     Mental Status: He is alert and oriented to person, place, and time.  Psychiatric:        Mood and Affect: Mood normal.        Behavior: Behavior normal. Behavior is cooperative.        Thought Content: Thought content normal.        Judgment: Judgment normal.       Assessment & Plan:  1. Chronic atrial fibrillation  - apixaban (ELIQUIS) 2.5 MG TABS tablet; Take 1 tablet (2.5 mg total) by mouth 2 (two) times daily.   Dispense: 180 tablet; Refill: 0 - metoprolol succinate (TOPROL-XL) 25 MG 24 hr tablet; Take 1 tablet (25 mg total) by mouth at bedtime.  Dispense: 90 tablet; Refill: 0  2. Medication refill  - apixaban (ELIQUIS) 2.5 MG TABS tablet; Take 1 tablet (2.5 mg total) by mouth 2 (two) times daily.  Dispense: 180 tablet; Refill: 0 - furosemide (LASIX) 40 MG tablet; Take 1 tablet (40 mg total) by mouth daily.  Dispense: 90 tablet; Refill: 0 - metoprolol succinate (TOPROL-XL) 25 MG 24 hr tablet; Take 1 tablet (25 mg total) by mouth at bedtime.  Dispense: 90 tablet; Refill: 0 - pravastatin (PRAVACHOL) 20 MG tablet; Take 1 tablet (20 mg total) by mouth at bedtime.  Dispense: 90 tablet; Refill: 0 - KLOR-CON M10 10 MEQ tablet; Take 1 tablet (10 mEq total) by mouth daily.  Dispense: 90 tablet; Refill: 0  3. Essential hypertension  - furosemide (LASIX) 40 MG tablet; Take 1 tablet (40 mg total) by mouth daily.  Dispense: 90 tablet; Refill: 0 - metoprolol succinate (TOPROL-XL) 25 MG 24 hr tablet; Take 1 tablet (25 mg total) by mouth at bedtime.  Dispense: 90 tablet; Refill: 0 - KLOR-CON M10 10 MEQ tablet; Take 1 tablet (10 mEq total) by mouth daily.  Dispense: 90 tablet; Refill: 0  4. Hyperlipidemia, unspecified hyperlipidemia type  - pravastatin (PRAVACHOL) 20 MG tablet; Take 1 tablet (20 mg total) by mouth at bedtime.  Dispense: 90 tablet; Refill: 0  [facility]

## 2018-06-06 ENCOUNTER — Telehealth: Payer: Self-pay | Admitting: *Deleted

## 2018-06-06 NOTE — Telephone Encounter (Signed)
Copied from [place] [phone]. Topic: General - Inquiry >> Jun 06, 2018  2:10 PM Berneta Levins wrote: Reason for CRM:   [place] from [facility] calling.  States that fax two plan of cares over - one DOS 04/19/2018, 05/31/2018 and they need those signed and faxed back ASAP.  She is calling to make sure these were received. [place] can be reached at [phone]

## 2018-06-07 NOTE — Telephone Encounter (Signed)
I don't remember seeing this. Can you have them refax, thanks

## 2018-06-07 NOTE — Telephone Encounter (Signed)
Form received and placed in Cory's folder. 

## 2018-06-07 NOTE — Telephone Encounter (Signed)
Jeremy Kelley, I put this on your desk/folder Friday.  Highlighted the signature area.  Do you remember seeing this?

## 2018-06-08 ENCOUNTER — Encounter: Payer: Self-pay | Admitting: Cardiovascular Disease

## 2018-06-08 ENCOUNTER — Ambulatory Visit (INDEPENDENT_AMBULATORY_CARE_PROVIDER_SITE_OTHER): Payer: Medicare Other | Admitting: Cardiovascular Disease

## 2018-06-08 VITALS — BP 122/62 | HR 67 | Ht 67.0 in | Wt 123.1 lb

## 2018-06-08 DIAGNOSIS — I4811 Longstanding persistent atrial fibrillation: Secondary | ICD-10-CM

## 2018-06-08 DIAGNOSIS — I351 Nonrheumatic aortic (valve) insufficiency: Secondary | ICD-10-CM

## 2018-06-08 NOTE — Telephone Encounter (Signed)
Signed and added to fax pile

## 2018-06-08 NOTE — Patient Instructions (Signed)
Medication Instructions:  Your physician recommends that you continue on your current medications as directed. Please refer to the Current Medication list given to you today.  If you need a refill on your cardiac medications before your next appointment, please call your pharmacy.    Lab work: None Ordered    Testing/Procedures: None Ordered   Follow-Up: At [facility], you and your health needs are our priority.  As part of our continuing mission to provide you with exceptional heart care, we have created designated Provider Care Teams.  These Care Teams include your primary Cardiologist (physician) and Advanced Practice Providers (APPs -  Physician Assistants and Nurse Practitioners) who all work together to provide you with the care you need, when you need it. You will need a follow up appointment in:  3 to 6 months.  Please call our office 2 months in advance to schedule this appointment.  You may see Mertie Moores, MD or one of the following Advanced Practice Providers on your designated Care Team: Richardson Dopp, PA-C [place], [place] . Daune Perch, NP
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
## 2018-06-20 ENCOUNTER — Ambulatory Visit (INDEPENDENT_AMBULATORY_CARE_PROVIDER_SITE_OTHER): Payer: Medicare Other | Admitting: Adult Health

## 2018-06-20 ENCOUNTER — Encounter: Payer: Self-pay | Admitting: Adult Health

## 2018-06-20 VITALS — BP 104/60 | Temp 98.2°F | Wt 128.0 lb

## 2018-06-20 DIAGNOSIS — K5901 Slow transit constipation: Secondary | ICD-10-CM

## 2018-06-20 NOTE — Progress Notes (Signed)
Subjective:    Patient ID: Jeremy Kelley, male    DOB: 1926/12/28, 85 y.o.   MRN: [id number]  HPI 85 year old male who  has a past medical history of Arthritis, Carotid artery stenosis (11/2008), Colon polyps, Diverticulosis, Hepatitis, Hyperlipidemia, Hypertension, Numbness, Phimosis, Prostate cancer ([place]) (2001), and Urothelial carcinoma of left distal ureter ([place]).  He presents to the office today for constipation. Reports having a bowel movement every 3 days. Does have intermittent episodes of lower abdominal pain. Has been using OTC medication without much relief.   He eats out most of his meals and does not drink water throughout the day   Last BM was this morning and was hard. He denies rectal bleeding    Review of Systems See HPI   Past Medical History:  Diagnosis Date  . Arthritis   . Carotid artery stenosis 11/2008   bilateral 40-59% stenosis  . Colon polyps   . Diverticulosis   . Hepatitis    doesn't know which type 1969  . Hyperlipidemia   . Hypertension   . Numbness    fingertips  . Phimosis   . Prostate cancer ([facility] [place]) 2001  . Urothelial carcinoma of left distal ureter [facility])     Social History   Socioeconomic History  . Marital status: Single    Spouse name: Not on file  . Number of children: 0  . Years of education: Not on file  . Highest education level: Not on file  Occupational History  . Occupation: Retired  Scientific laboratory technician  . Financial resource strain: Not on file  . Food insecurity:    Worry: Not on file    Inability: Not on file  . Transportation needs:    Medical: Not on file    Non-medical: Not on file  Tobacco Use  . Smoking status: Former Research scientist (life sciences)  . Smokeless tobacco: Never Used  . Tobacco comment: 07/29/2015 "might smoke a cigar once/year"  Substance and Sexual Activity  . Alcohol use: Yes    Alcohol/week: 14.0 standard drinks    Types: 14 Glasses of wine per week    Comment: 07/29/2015 "big glass of wine q night"  . Drug use: No  .  Sexual activity: Not on file  Lifestyle  . Physical activity:    Days per week: Not on file    Minutes per session: Not on file  . Stress: Not on file  Relationships  . Social connections:    Talks on phone: Not on file    Gets together: Not on file    Attends religious service: Not on file    Active member of club or organization: Not on file    Attends meetings of clubs or organizations: Not on file    Relationship status: Not on file  . Intimate partner violence:    Fear of current or ex partner: Not on file    Emotionally abused: Not on file    Physically abused: Not on file    Forced sexual activity: Not on file  Other Topics Concern  . Not on file  Social History Narrative   Patient is single, has never been married.  Does not have any children.  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   He drinks approximately 3-4 glasses of wine per week.  No history of excessive alcohol use.  Occasionally smokes a cigar.   Spends winters in [place]         Past Surgical History:  Procedure Laterality Date  . BIOPSY  10/01/2011   Procedure: BIOPSY;  Surgeon: [name], MD;  [facility];  Service: Urology;;  biopsy of bladder tumor  . BLADDER SUSPENSION  08/18/10   [name]  . CARDIAC CATHETERIZATION N/A 07/29/2015   Procedure: Right/Left Heart Cath and Coronary Angiography;  Surgeon: Peter M Martinique, MD;  Location: [facility];  Service: Cardiovascular;  Laterality: N/A;  . CATARACT EXTRACTION W/ INTRAOCULAR LENS  IMPLANT, BILATERAL Bilateral   . CYSTOSCOPY W/ RETROGRADES  01/27/2012   Procedure: CYSTOSCOPY WITH RETROGRADE PYELOGRAM;  Surgeon: [name], MD;  [facility];  Service: Urology;  Laterality: Left;  . CYSTOSCOPY WITH RETROGRADE PYELOGRAM, URETEROSCOPY AND STENT PLACEMENT Left 11/24/2012   Procedure: CYSTOSCOPY WITH left RETROGRADE PYELOGRAM, bladder washings, ureteroscopy;  Surgeon: [name], MD;  [facility];  Service: Urology;   Laterality: Left;  . CYSTOSCOPY WITH RETROGRADE PYELOGRAM, URETEROSCOPY AND STENT PLACEMENT Bilateral 11/19/2013   Procedure: CYSTOSCOPY WITH BILATERAL RETROGRADE PYELOGRAM,;  Surgeon: [name], MD;  [facility];  Service: Urology;  Laterality: Bilateral;  . EYE SURGERY  09/24/10   left eye. "Retina surgery"  . INGUINAL HERNIA REPAIR  1960  . PROSTATE SURGERY     s/p laparoscopic prostate surgery  . SKIN BIOPSY  05/20/2017   Superficial and nodular basal cell carcinoma (left temple)    basal cell carcinoma with sclerosis (right temple)  . SKIN CANCER EXCISION     a/p skin cancer right foot-non melanoma  . skin laceration    . TEE WITHOUT CARDIOVERSION N/A 07/29/2015   Procedure: TRANSESOPHAGEAL ECHOCARDIOGRAM (TEE);  Surgeon: Pixie Casino, MD;  Location: [facility] ENDOSCOPY;  Service: Cardiovascular;  Laterality: N/A;  cath to follow  . TRANSURETHRAL RESECTION OF BLADDER TUMOR  10/01/2011   Procedure: TRANSURETHRAL RESECTION OF BLADDER TUMOR (TURBT);  Surgeon: [name], MD;  [facility];  Service: Urology;  Laterality: N/A;  . TRANSURETHRAL RESECTION OF BLADDER TUMOR N/A 11/19/2013   Procedure: TRANSURETHRAL RESECTION OF BLADDER TUMOR (TURBT);  Surgeon: [name], MD;  [facility];  Service: Urology;  Laterality: N/A;  . URETEROSCOPY  01/27/2012   Procedure: URETEROSCOPY;  Surgeon: [name], MD;  [facility];  Service: Urology;  Laterality: Left;  CYSTO, Left RETROGRADE PYELOGRPHY, LEFT URETEROSCOPY   . URINARY SPHINCTER IMPLANT    . VARICOSE VEIN SURGERY      Family History  Problem Relation Age of Onset  . Colon cancer Neg Hx   . Colon polyps Neg Hx     No Known Allergies  Current Outpatient Medications on File Prior to Visit  Medication Sig Dispense Refill  . apixaban (ELIQUIS) 2.5 MG TABS tablet Take 1 tablet (2.5 mg total) by mouth 2 (two) times daily. 180 tablet 0  . B Complex-C (B-COMPLEX WITH VITAMIN C) tablet Take 1 tablet by mouth daily. Reported  on 07/24/2015 90 tablet 3  . cholecalciferol (VITAMIN D) 1000 units tablet Take 1 tablet (1,000 Units total) by mouth daily. 90 tablet 3  . furosemide (LASIX) 40 MG tablet Take 1 tablet (40 mg total) by mouth daily. 90 tablet 0  . KLOR-CON M10 10 MEQ tablet Take 1 tablet (10 mEq total) by mouth daily. 90 tablet 0  . methylcellulose (ARTIFICIAL TEARS) 1 % ophthalmic solution Place 1 drop into both eyes 2 (two) times daily.     . metoprolol succinate (TOPROL-XL) 25 MG 24 hr tablet Take 1 tablet (25 mg total) by mouth at bedtime.  90 tablet 0  . Multiple Vitamins-Minerals (PRESERVISION/LUTEIN PO) Take 1 tablet by mouth daily.    [name] oxybutynin (DITROPAN-XL) 10 MG 24 hr tablet Take 10 mg by mouth at bedtime.    . pravastatin (PRAVACHOL) 20 MG tablet Take 1 tablet (20 mg total) by mouth at bedtime. 90 tablet 0   No current facility-administered medications on file prior to visit.     BP 104/60   Temp 98.2 F (36.8 C)   Wt 128 lb (58.1 kg)   BMI 20.05 kg/m       Objective:   Physical Exam Vitals signs reviewed.  Constitutional:      Appearance: Normal appearance.  Abdominal:     General: Bowel sounds are normal. There is no distension.     Palpations: Abdomen is soft. There is no mass.     Tenderness: There is no abdominal tenderness. There is no rebound.     Hernia: No hernia is present.  Neurological:     Mental Status: He is alert.       Assessment & Plan:  1. Slow transit constipation - Educated on the importance of drinking water throughout the day, especially with taking lasix  - eat more green leafy vegetables  - Try Metamucil   [name], NP

## 2018-06-27 ENCOUNTER — Encounter: Payer: Self-pay | Admitting: Adult Health

## 2018-06-27 ENCOUNTER — Ambulatory Visit (INDEPENDENT_AMBULATORY_CARE_PROVIDER_SITE_OTHER): Payer: Medicare Other | Admitting: Adult Health

## 2018-06-27 ENCOUNTER — Other Ambulatory Visit: Payer: Self-pay

## 2018-06-27 VITALS — BP 120/54 | Temp 97.8°F | Wt 128.0 lb

## 2018-06-27 DIAGNOSIS — R197 Diarrhea, unspecified: Secondary | ICD-10-CM

## 2018-06-27 NOTE — Progress Notes (Signed)
Subjective:    Patient ID: Jeremy Kelley, male    DOB: Jun 30, 1926, 85 y.o.   MRN: [id number]  HPI 85 year old male who  has a past medical history of Arthritis, Carotid artery stenosis (11/2008), Colon polyps, Diverticulosis, Hepatitis, Hyperlipidemia, Hypertension, Numbness, Phimosis, Prostate cancer ([place]) (2001), and Urothelial carcinoma of left distal ureter ([place]).  He presents to the office today for an acute issue of 24 hours of diarrhea. Denies any other symptoms. Has not started on any new medications. Has not experienced fever, chills, or abdominal pain   Has not taken anything OTC for his symptoms    Review of Systems See HPI   Past Medical History:  Diagnosis Date  . Arthritis   . Carotid artery stenosis 11/2008   bilateral 40-59% stenosis  . Colon polyps   . Diverticulosis   . Hepatitis    doesn't know which type 1969  . Hyperlipidemia   . Hypertension   . Numbness    fingertips  . Phimosis   . Prostate cancer ([place]) 2001  . Urothelial carcinoma of left distal ureter [facility])     Social History   Socioeconomic History  . Marital status: Single    Spouse name: Not on file  . Number of children: 0  . Years of education: Not on file  . Highest education level: Not on file  Occupational History  . Occupation: Retired  Scientific laboratory technician  . Financial resource strain: Not on file  . Food insecurity:    Worry: Not on file    Inability: Not on file  . Transportation needs:    Medical: Not on file    Non-medical: Not on file  Tobacco Use  . Smoking status: Former Research scientist (life sciences)  . Smokeless tobacco: Never Used  . Tobacco comment: 07/29/2015 "might smoke a cigar once/year"  Substance and Sexual Activity  . Alcohol use: Yes    Alcohol/week: 14.0 standard drinks    Types: 14 Glasses of wine per week    Comment: 07/29/2015 "big glass of wine q night"  . Drug use: No  . Sexual activity: Not on file  Lifestyle  . Physical activity:    Days per week: Not on file    Minutes  per session: Not on file  . Stress: Not on file  Relationships  . Social connections:    Talks on phone: Not on file    Gets together: Not on file    Attends religious service: Not on file    Active member of club or organization: Not on file    Attends meetings of clubs or organizations: Not on file    Relationship status: Not on file  . Intimate partner violence:    Fear of current or ex partner: Not on file    Emotionally abused: Not on file    Physically abused: Not on file    Forced sexual activity: Not on file  Other Topics Concern  . Not on file  Social History Narrative   Patient is single, has never been married.  Does not have any children.  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   He drinks approximately 3-4 glasses of wine per week.  No history of excessive alcohol use.  Occasionally smokes a cigar.   Spends winters in [place]         Past Surgical History:  Procedure Laterality Date  . BIOPSY  10/01/2011   Procedure: BIOPSY;  Surgeon: [name], MD;  Location:  [facility];  Service: Urology;;  biopsy of bladder tumor  . BLADDER SUSPENSION  08/18/10   [name]  . CARDIAC CATHETERIZATION N/A 07/29/2015   Procedure: Right/Left Heart Cath and Coronary Angiography;  Surgeon: Peter M Martinique, MD;  Location: [facility];  Service: Cardiovascular;  Laterality: N/A;  . CATARACT EXTRACTION W/ INTRAOCULAR LENS  IMPLANT, BILATERAL Bilateral   . CYSTOSCOPY W/ RETROGRADES  01/27/2012   Procedure: CYSTOSCOPY WITH RETROGRADE PYELOGRAM;  Surgeon: [name], MD;  Location: [facility];  Service: Urology;  Laterality: Left;  . CYSTOSCOPY WITH RETROGRADE PYELOGRAM, URETEROSCOPY AND STENT PLACEMENT Left 11/24/2012   Procedure: CYSTOSCOPY WITH left RETROGRADE PYELOGRAM, bladder washings, ureteroscopy;  Surgeon: [name], MD;  Location: [facility];  Service: Urology;  Laterality: Left;  . CYSTOSCOPY WITH RETROGRADE PYELOGRAM, URETEROSCOPY AND STENT PLACEMENT Bilateral  11/19/2013   Procedure: CYSTOSCOPY WITH BILATERAL RETROGRADE PYELOGRAM,;  Surgeon: [name], MD;  Location: [facility];  Service: Urology;  Laterality: Bilateral;  . EYE SURGERY  09/24/10   left eye. "Retina surgery"  . INGUINAL HERNIA REPAIR  1960  . PROSTATE SURGERY     s/p laparoscopic prostate surgery  . SKIN BIOPSY  05/20/2017   Superficial and nodular basal cell carcinoma (left temple)    basal cell carcinoma with sclerosis (right temple)  . SKIN CANCER EXCISION     a/p skin cancer right foot-non melanoma  . skin laceration    . TEE WITHOUT CARDIOVERSION N/A 07/29/2015   Procedure: TRANSESOPHAGEAL ECHOCARDIOGRAM (TEE);  Surgeon: Pixie Casino, MD;  [facility];  Service: Cardiovascular;  Laterality: N/A;  cath to follow  . TRANSURETHRAL RESECTION OF BLADDER TUMOR  10/01/2011   Procedure: TRANSURETHRAL RESECTION OF BLADDER TUMOR (TURBT);  Surgeon: [name], MD;  Location: [facility];  Service: Urology;  Laterality: N/A;  . TRANSURETHRAL RESECTION OF BLADDER TUMOR N/A 11/19/2013   Procedure: TRANSURETHRAL RESECTION OF BLADDER TUMOR (TURBT);  Surgeon: [name], MD;  Location: [facility];  Service: Urology;  Laterality: N/A;  . URETEROSCOPY  01/27/2012   Procedure: URETEROSCOPY;  Surgeon: [name], MD;  Location: [facility];  Service: Urology;  Laterality: Left;  CYSTO, Left RETROGRADE PYELOGRPHY, LEFT URETEROSCOPY   . URINARY SPHINCTER IMPLANT    . VARICOSE VEIN SURGERY      Family History  Problem Relation Age of Onset  . Colon cancer Neg Hx   . Colon polyps Neg Hx     No Known Allergies  Current Outpatient Medications on File Prior to Visit  Medication Sig Dispense Refill  . apixaban (ELIQUIS) 2.5 MG TABS tablet Take 1 tablet (2.5 mg total) by mouth 2 (two) times daily. 180 tablet 0  . B Complex-C (B-COMPLEX WITH VITAMIN C) tablet Take 1 tablet by mouth daily. Reported on 07/24/2015 90 tablet 3  . cholecalciferol (VITAMIN D) 1000 units tablet Take 1 tablet (1,000 Units  total) by mouth daily. 90 tablet 3  . furosemide (LASIX) 40 MG tablet Take 1 tablet (40 mg total) by mouth daily. 90 tablet 0  . KLOR-CON M10 10 MEQ tablet Take 1 tablet (10 mEq total) by mouth daily. 90 tablet 0  . methylcellulose (ARTIFICIAL TEARS) 1 % ophthalmic solution Place 1 drop into both eyes 2 (two) times daily.     . metoprolol succinate (TOPROL-XL) 25 MG 24 hr tablet Take 1 tablet (25 mg total) by mouth at bedtime. 90 tablet 0  . Multiple Vitamins-Minerals (PRESERVISION/LUTEIN PO) Take 1 tablet by mouth daily.    [name]  oxybutynin (DITROPAN-XL) 10 MG 24 hr tablet Take 10 mg by mouth at bedtime.    . pravastatin (PRAVACHOL) 20 MG tablet Take 1 tablet (20 mg total) by mouth at bedtime. 90 tablet 0   No current facility-administered medications on file prior to visit.     BP (!) 120/54   Temp 97.8 F (36.6 C)   Wt 128 lb (58.1 kg)   BMI 20.05 kg/m       Objective:   Physical Exam Vitals signs and nursing note reviewed.  Constitutional:      Appearance: Normal appearance.  Cardiovascular:     Rate and Rhythm: Normal rate and regular rhythm.     Pulses: Normal pulses.     Heart sounds: Normal heart sounds.  Pulmonary:     Effort: Pulmonary effort is normal.     Breath sounds: Normal breath sounds.  Abdominal:     General: Abdomen is flat. Bowel sounds are normal.     Palpations: Abdomen is soft. There is no mass.     Tenderness: There is no abdominal tenderness. There is no guarding or rebound.     Hernia: No hernia is present.  Skin:    General: Skin is warm and dry.     Capillary Refill: Capillary refill takes less than 2 seconds.  Neurological:     General: No focal deficit present.     Mental Status: He is alert and oriented to person, place, and time.  Psychiatric:        Mood and Affect: Mood normal.        Thought Content: Thought content normal.        Judgment: Judgment normal.       Assessment & Plan:  1. Diarrhea, unspecified type - less than 24 hours  of diarrhea. Does not appear acutely ill.  - Advised to stay hydrated and rest - ok to take pepto as needed - Return precautions reviewed  [name], NP

## 2018-07-11 DIAGNOSIS — H532 Diplopia: Secondary | ICD-10-CM | POA: Diagnosis not present

## 2018-08-01 ENCOUNTER — Other Ambulatory Visit: Payer: Self-pay | Admitting: Adult Health

## 2018-08-01 MED ORDER — DOXYCYCLINE HYCLATE 100 MG PO CAPS: 100.0000 mg | ORAL_CAPSULE | Freq: Two times a day (BID) | ORAL | 0 refills | Status: DC

## 2018-08-18 DIAGNOSIS — H04123 Dry eye syndrome of bilateral lacrimal glands: Secondary | ICD-10-CM | POA: Diagnosis not present

## 2018-08-18 DIAGNOSIS — H26492 Other secondary cataract, left eye: Secondary | ICD-10-CM | POA: Diagnosis not present

## 2018-08-18 DIAGNOSIS — H532 Diplopia: Secondary | ICD-10-CM | POA: Diagnosis not present

## 2018-08-18 DIAGNOSIS — H43813 Vitreous degeneration, bilateral: Secondary | ICD-10-CM | POA: Diagnosis not present

## 2018-08-30 ENCOUNTER — Encounter: Payer: Self-pay | Admitting: Adult Health

## 2018-08-30 ENCOUNTER — Other Ambulatory Visit: Payer: Self-pay

## 2018-08-30 ENCOUNTER — Ambulatory Visit (INDEPENDENT_AMBULATORY_CARE_PROVIDER_SITE_OTHER): Payer: Medicare Other | Admitting: Adult Health

## 2018-08-30 DIAGNOSIS — R2681 Unsteadiness on feet: Secondary | ICD-10-CM | POA: Diagnosis not present

## 2018-08-30 DIAGNOSIS — E559 Vitamin D deficiency, unspecified: Secondary | ICD-10-CM | POA: Diagnosis not present

## 2018-08-30 DIAGNOSIS — E538 Deficiency of other specified B group vitamins: Secondary | ICD-10-CM

## 2018-08-30 DIAGNOSIS — R5382 Chronic fatigue, unspecified: Secondary | ICD-10-CM | POA: Diagnosis not present

## 2018-08-30 NOTE — Progress Notes (Signed)
Virtual Visit via Telephone Note  I connected with Jeremy Kelley on 08/30/18 at  9:30 AM EDT by telephone and verified that I am speaking with the correct person using two identifiers.   I discussed the limitations, risks, security and privacy concerns of performing an evaluation and management service by telephone and the availability of in person appointments. I also discussed with the patient that there may be a patient responsible charge related to this service. The patient expressed understanding and agreed to proceed.  Location patient: home Location provider: work or home office Participants present for the call: patient, provider Patient did not have a visit in the prior 7 days to address this/these issue(s).   History of Present Illness: 85 year old male who  has a past medical history of Arthritis, Carotid artery stenosis (11/2008), Colon polyps, Diverticulosis, Hepatitis, Hyperlipidemia, Hypertension, Numbness, Phimosis, Prostate cancer ([place]) (2001), and Urothelial carcinoma of left distal ureter ([place]).  He is being evaluated today for multiple complaints including worsening fatigue and loss of balance.   He reports that over the last 2 to 3 weeks fatigue aspect is becoming worse.  Goes to bed around midnight and wakes up around 8 AM.  He feels as though he gets plenty of rest but he has become "more slow getting up and moving in the morning.  He also reports that he will often fall asleep while reading the newspaper in the morning or reading a book in the afternoon".  Fatigue aspect becomes worse with exertion but he has no shortness of breath or chest pain. He has been worked up in the past for this by myself as well as cardiology, he does have multiple cardiac issues that can contribute to fatigue as well as being prescribed metoprolol.  He is taking Vitamin B 12 and D supplements.   Also reports his only feels as though he has become more "off balance".  This has been a  longstanding issue and again he has been worked up for this in the past.  Currently he denies any falls but often feels as though he will "bump into a wall or coffee table while ambulating".  He has gone to physical therapy in the past and reported noticeable improvement and would like to revisit physical therapy.   Observations/Objective: Patient sounds cheerful and well on the phone. I do not appreciate any SOB. Speech and thought processing are grossly intact. Patient reported vitals:  Assessment and Plan: 1. Gait instability -He will inform me of which physical therapist he wants to go to.  2. Chronic fatigue -Again, informed patient that this is likely mild multifactorial due to age, cardiac conditions, medication.  He would still like to do lab work to look for other causes. - CBC with Differential/Platelet; Future - CMP; Future - Brain Natriuretic Peptide; Future - TSH; Future - Vitamin D, 25-hydroxy; Future - Iron and TIBC; Future  3. Vitamin B 12 deficiency  - Vitamin B12; Future  4. Vitamin D deficiency  - Vitamin D, 25-hydroxy; Future   Follow Up Instructions:  I did not refer this patient for an OV in the next 24 hours for this/these issue(s).  I discussed the assessment and treatment plan with the patient. The patient was provided an opportunity to ask questions and all were answered. The patient agreed with the plan and demonstrated an understanding of the instructions.   The patient was advised to call back or seek an in-person evaluation if the symptoms worsen or if  the condition fails to improve as anticipated.  I provided 25 minutes of non-face-to-face time during this encounter.   [name], NP

## 2018-08-31 ENCOUNTER — Other Ambulatory Visit: Payer: Self-pay | Admitting: Adult Health

## 2018-08-31 ENCOUNTER — Other Ambulatory Visit (INDEPENDENT_AMBULATORY_CARE_PROVIDER_SITE_OTHER): Payer: Medicare Other

## 2018-08-31 ENCOUNTER — Other Ambulatory Visit: Payer: Self-pay

## 2018-08-31 DIAGNOSIS — R5382 Chronic fatigue, unspecified: Secondary | ICD-10-CM | POA: Diagnosis not present

## 2018-08-31 DIAGNOSIS — E538 Deficiency of other specified B group vitamins: Secondary | ICD-10-CM

## 2018-08-31 DIAGNOSIS — R2681 Unsteadiness on feet: Secondary | ICD-10-CM

## 2018-08-31 DIAGNOSIS — E559 Vitamin D deficiency, unspecified: Secondary | ICD-10-CM | POA: Diagnosis not present

## 2018-08-31 DIAGNOSIS — R718 Other abnormality of red blood cells: Secondary | ICD-10-CM | POA: Diagnosis not present

## 2018-08-31 LAB — COMPREHENSIVE METABOLIC PANEL
ALT: 14 U/L (ref 0–53)
AST: 23 U/L (ref 0–37)
Albumin: 3.9 g/dL (ref 3.5–5.2)
Alkaline Phosphatase: 53 U/L (ref 39–117)
BUN: 23 mg/dL (ref 6–23)
CO2: 34 mEq/L — ABNORMAL HIGH (ref 19–32)
Calcium: 9.5 mg/dL (ref 8.4–10.5)
Chloride: 102 mEq/L (ref 96–112)
Creatinine, Ser: 1.1 mg/dL (ref 0.40–1.50)
GFR: 62.94 mL/min (ref >60.00)
Glucose, Bld: 95 mg/dL (ref 70–99)
Potassium: 4 mEq/L (ref 3.5–5.1)
Sodium: 143 mEq/L (ref 135–145)
Total Bilirubin: 0.9 mg/dL (ref 0.2–1.2)
Total Protein: 6.3 g/dL (ref 6.0–8.3)

## 2018-08-31 LAB — CBC WITH DIFFERENTIAL/PLATELET
Basophils Absolute: 0 10*3/uL (ref 0.0–0.1)
Basophils Relative: 0.4 % (ref 0.0–3.0)
Eosinophils Absolute: 0.2 10*3/uL (ref 0.0–0.7)
Eosinophils Relative: 2.2 % (ref 0.0–5.0)
HCT: 39.7 % (ref 39.0–52.0)
Hemoglobin: 13.8 g/dL (ref 13.0–17.0)
Lymphocytes Relative: 13.5 % (ref 12.0–46.0)
Lymphs Abs: 1 10*3/uL (ref 0.7–4.0)
MCHC: 34.8 g/dL (ref 30.0–36.0)
MCV: 95.2 fl (ref 78.0–100.0)
Monocytes Absolute: 0.4 10*3/uL (ref 0.1–1.0)
Monocytes Relative: 6 % (ref 3.0–12.0)
Neutro Abs: 5.6 10*3/uL (ref 1.4–7.7)
Neutrophils Relative %: 77.9 % — ABNORMAL HIGH (ref 43.0–77.0)
Platelets: 146 10*3/uL — ABNORMAL LOW (ref 150.0–400.0)
RBC: 4.17 Mil/uL — ABNORMAL LOW (ref 4.22–5.81)
RDW: 13.6 % (ref 11.5–15.5)
WBC: 7.2 10*3/uL (ref 4.0–10.5)

## 2018-08-31 LAB — BRAIN NATRIURETIC PEPTIDE: Pro B Natriuretic peptide (BNP): 459 pg/mL — ABNORMAL HIGH (ref 0.0–100.0)

## 2018-08-31 LAB — TSH: TSH: 0.94 u[IU]/mL (ref 0.35–4.50)

## 2018-08-31 LAB — VITAMIN D 25 HYDROXY (VIT D DEFICIENCY, FRACTURES): VITD: 55.75 ng/mL (ref 30.00–100.00)

## 2018-08-31 LAB — VITAMIN B12: Vitamin B-12: 877 pg/mL (ref 211–911)

## 2018-09-01 LAB — IRON, TOTAL/TOTAL IRON BINDING CAP
%SAT: 20 % (calc) (ref 20–48)
Iron: 63 ug/dL (ref 50–180)
TIBC: 320 mcg/dL (calc) (ref 250–425)

## 2018-09-06 ENCOUNTER — Telehealth: Payer: Self-pay

## 2018-09-06 DIAGNOSIS — I351 Nonrheumatic aortic (valve) insufficiency: Secondary | ICD-10-CM

## 2018-09-06 DIAGNOSIS — Z760 Encounter for issue of repeat prescription: Secondary | ICD-10-CM

## 2018-09-06 DIAGNOSIS — I10 Essential (primary) hypertension: Secondary | ICD-10-CM

## 2018-09-06 NOTE — Telephone Encounter (Signed)
New Message:    Pt says he needs to talk to the nurse. Pt said he had lab work at his Primary doctor. He received a call from the nurse and he was told to contact Dr Acie Fredrickson or his nurse. He said they want him to see Dr Acie Fredrickson, he needs to explain to you, what is going on please.

## 2018-09-06 NOTE — Telephone Encounter (Signed)
Spoke with the patient, he saw Jeremy Peng, NP, he had lab work and his ProBNP was elevated. He stated he has been SOB and his symptoms have been worsening over the past few weeks. He stated he was not distressed and he wanted to have a visit with Dr. Acie Fredrickson. He only wants to do an in-person visit.

## 2018-09-07 MED ORDER — KLOR-CON M10 10 MEQ PO TBCR: 10.0000 meq | EXTENDED_RELEASE_TABLET | Freq: Two times a day (BID) | ORAL | 3 refills | Status: DC

## 2018-09-07 MED ORDER — TORSEMIDE 20 MG PO TABS: 20.0000 mg | ORAL_TABLET | Freq: Two times a day (BID) | ORAL | 3 refills | Status: DC

## 2018-09-07 NOTE — Telephone Encounter (Signed)
Medication changes have been documented and prescriptions to pharmacy Scheduling patient to see Pecolia Ades, NP on Tuesday 6/2 in the office

## 2018-09-07 NOTE — Telephone Encounter (Signed)
Talked to [name], His lasix is not causing any significant increase in diuresis Will DC lasix Start Torsemide 20 mg BID Increase Kdur to 10 meq BID  He would like an appt in person - I offered to have his see an app which has accepted  Please set up an appt with a APP on Monday or Tuesday of next week  Will need a BMP same day

## 2018-09-07 NOTE — Telephone Encounter (Signed)
I s/w pt and advised him of his appt with Pecolia Ades, NP on 09/12/18 @ 10:15 am. Pt confirmed the appt information with verbal understanding. Pt thanked me for the call .

## 2018-09-07 NOTE — Telephone Encounter (Signed)
F/U Message            Patient is returning to [facility]. Call would like a call back.

## 2018-09-07 NOTE — Telephone Encounter (Signed)
Called at 11:47. No answer.  Will try back later.

## 2018-09-11 NOTE — Progress Notes (Signed)
Cardiology Office Note:    Date:  09/12/2018   ID:  [name], DOB Aug 20, 1926, MRN [id number]  PCP:  [name], NP  Cardiologist:  Mertie Moores, MD  Referring MD: [name], NP   Chief Complaint  Patient presents with  . Follow-up    CHF    History of Present Illness:    LUCAH PETTA is a 85 y.o. male with a past medical history significant for CHF, atrial fibrillation, hyperlipidemia, Raynaud's, carotid artery disease (40-50% bilaterally) and aortic insufficiency.   He has had several years of complaints of progressive fatigue with walking. R&L heart cath in 07/2015 showed no significant CAD and normal right heart filling pressures. He did have moderate aortic insufficiency. Last echo 12/08/2015 showed normal LV systolic function with EF 55-60% and moderate aortic regurgitation essentially unchanged from prior.   He was last seen in the office by Dr. Acie Fredrickson on 06/08/2018 at which time he was having generalized weakness. He was to start PT.  He was advised to limit salt. He eats out for every meal. He was continued on rate control and anticoagulation for afib. It was noted that due to his overall frailty he is not a candidate for aortic valve replacement. He was noted to have stable pulmonary hypertension. He was noted to have multiple cardiac reasons which all likely contribute to his fatigue along with his advanced age.   On 09/06/2018 the patient called the office to report that his PCP had checked a ProBNP which was elevated. He noted that his breathing and symptoms had been worse over the past few weeks.  Dr. Acie Fredrickson advised to start torsemide 20 mg BID as lasix had not been providing good diuresis. KDur was also increased. He was arranged for an in office visit today and to have labs checked.   Today Mr. Soulier says his only complaint is feeling tired. His breathing is good. He gets mildly short of breath with walking 2 flights of stairs but nothing out of the  ordinary. He feels more tired in the morning and not so much in the evening. No orthopnea, PND, palpitations. He has mild lightheadedness after breakfast at which time he has just taken his AM meds. He has not had any falls. He has trace lower leg edema.  He sleeps for 8 hours interrupted.   Mr. Griggs lives alone and cares for himself. He has all of his meds with him and multiple pieces of paper. We reviewed his meds in detail and I wrote out a new schedule of AM and PM meds for him to follow. He tells me that he has been having blurred vision and trouble focusing his eyes for about the last month. He went to his ophthalmologist who could not find a cause. He was told that 2 of his medications could be causing his issues which are torsemide and oxybutynin so he planned on stopping them. He says that he had only taking the first 2 dose of torsemide. We discussed that he could try going without the oxybutynin after discussing with his PCP but he may have trouble with fluid overload if he stops the torsemide. In any case he had not been taking the torsemide over the last month (although furosemide could cause the same effect). He seemed to have a better grasp on his medications after our discussion, although he will need follow up to make sure.     Past Medical History:  Diagnosis Date  . Arthritis   .  Carotid artery stenosis 11/2008   bilateral 40-59% stenosis  . Colon polyps   . Diverticulosis   . Hepatitis    doesn't know which type 1969  . Hyperlipidemia   . Hypertension   . Numbness    fingertips  . Phimosis   . Prostate cancer ([place]) 2001  . Urothelial carcinoma of left distal ureter [facility])     Past Surgical History:  Procedure Laterality Date  . BIOPSY  10/01/2011   Procedure: BIOPSY;  Surgeon: [name], MD;  [facility];  Service: Urology;;  biopsy of bladder tumor  . BLADDER SUSPENSION  08/18/10   [name]  . CARDIAC CATHETERIZATION N/A 07/29/2015   Procedure: Right/Left  Heart Cath and Coronary Angiography;  Surgeon: Peter M Martinique, MD;  Location: [facility];  Service: Cardiovascular;  Laterality: N/A;  . CATARACT EXTRACTION W/ INTRAOCULAR LENS  IMPLANT, BILATERAL Bilateral   . CYSTOSCOPY W/ RETROGRADES  01/27/2012   Procedure: CYSTOSCOPY WITH RETROGRADE PYELOGRAM;  Surgeon: [name], MD;  [facility];  Service: Urology;  Laterality: Left;  . CYSTOSCOPY WITH RETROGRADE PYELOGRAM, URETEROSCOPY AND STENT PLACEMENT Left 11/24/2012   Procedure: CYSTOSCOPY WITH left RETROGRADE PYELOGRAM, bladder washings, ureteroscopy;  Surgeon: [name], MD;  [facility];  Service: Urology;  Laterality: Left;  . CYSTOSCOPY WITH RETROGRADE PYELOGRAM, URETEROSCOPY AND STENT PLACEMENT Bilateral 11/19/2013   Procedure: CYSTOSCOPY WITH BILATERAL RETROGRADE PYELOGRAM,;  Surgeon: [name], MD;  [facility];  Service: Urology;  Laterality: Bilateral;  . EYE SURGERY  09/24/10   left eye. "Retina surgery"  . INGUINAL HERNIA REPAIR  1960  . PROSTATE SURGERY     s/p laparoscopic prostate surgery  . SKIN BIOPSY  05/20/2017   Superficial and nodular basal cell carcinoma (left temple)    basal cell carcinoma with sclerosis (right temple)  . SKIN CANCER EXCISION     a/p skin cancer right foot-non melanoma  . skin laceration    . TEE WITHOUT CARDIOVERSION N/A 07/29/2015   Procedure: TRANSESOPHAGEAL ECHOCARDIOGRAM (TEE);  Surgeon: Pixie Casino, MD;  Location: [facility];  Service: Cardiovascular;  Laterality: N/A;  cath to follow  . TRANSURETHRAL RESECTION OF BLADDER TUMOR  10/01/2011   Procedure: TRANSURETHRAL RESECTION OF BLADDER TUMOR (TURBT);  Surgeon: [name], MD;  [facility];  Service: Urology;  Laterality: N/A;  . TRANSURETHRAL RESECTION OF BLADDER TUMOR N/A 11/19/2013   Procedure: TRANSURETHRAL RESECTION OF BLADDER TUMOR (TURBT);  Surgeon: [name], MD;  [facility];  Service: Urology;  Laterality: N/A;  . URETEROSCOPY  01/27/2012    Procedure: URETEROSCOPY;  Surgeon: [name], MD;  [facility];  Service: Urology;  Laterality: Left;  CYSTO, Left RETROGRADE PYELOGRPHY, LEFT URETEROSCOPY   . URINARY SPHINCTER IMPLANT    . VARICOSE VEIN SURGERY      Current Medications: Current Meds  Medication Sig  . apixaban (ELIQUIS) 2.5 MG TABS tablet Take 1 tablet (2.5 mg total) by mouth 2 (two) times daily.  . B Complex-C (B-COMPLEX WITH VITAMIN C) tablet Take 1 tablet by mouth daily. Reported on 07/24/2015  . cholecalciferol (VITAMIN D) 1000 units tablet Take 1 tablet (1,000 Units total) by mouth daily.  [name] KLOR-CON M10 10 MEQ tablet Take 1 tablet (10 mEq total) by mouth 2 (two) times daily.  . methylcellulose (ARTIFICIAL TEARS) 1 % ophthalmic solution Place 1 drop into both eyes 2 (two) times daily.   . metoprolol succinate (TOPROL-XL) 25 MG 24 hr tablet Take 1 tablet (25  mg total) by mouth at bedtime.  [name] oxybutynin (DITROPAN-XL) 10 MG 24 hr tablet Take 10 mg by mouth at bedtime.  . pravastatin (PRAVACHOL) 20 MG tablet Take 1 tablet (20 mg total) by mouth at bedtime.  . torsemide (DEMADEX) 20 MG tablet Take 1 tablet (20 mg total) by mouth 2 (two) times daily.     Allergies:   Patient has no known allergies.   Social History   Socioeconomic History  . Marital status: Single    Spouse name: Not on file  . Number of children: 0  . Years of education: Not on file  . Highest education level: Not on file  Occupational History  . Occupation: Retired  Scientific laboratory technician  . Financial resource strain: Not on file  . Food insecurity:    Worry: Not on file    Inability: Not on file  . Transportation needs:    Medical: Not on file    Non-medical: Not on file  Tobacco Use  . Smoking status: Former Research scientist (life sciences)  . Smokeless tobacco: Never Used  . Tobacco comment: 07/29/2015 "might smoke a cigar once/year"  Substance and Sexual Activity  . Alcohol use: Yes    Alcohol/week: 14.0 standard drinks    Types: 14 Glasses of wine per week     Comment: 07/29/2015 "big glass of wine q night"  . Drug use: No  . Sexual activity: Not on file  Lifestyle  . Physical activity:    Days per week: Not on file    Minutes per session: Not on file  . Stress: Not on file  Relationships  . Social connections:    Talks on phone: Not on file    Gets together: Not on file    Attends religious service: Not on file    Active member of club or organization: Not on file    Attends meetings of clubs or organizations: Not on file    Relationship status: Not on file  Other Topics Concern  . Not on file  Social History Narrative   Patient is single, has never been married.  Does not have any children.  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   He drinks approximately 3-4 glasses of wine per week.  No history of excessive alcohol use.  Occasionally smokes a cigar.   Spends winters in [place]          Family History: The patient's family history is negative for Colon cancer and Colon polyps. ROS:   Please see the history of present illness.     All other systems reviewed and are negative.  EKGs/Labs/Other Studies Reviewed:    The following studies were reviewed today:  Echo 12/08/2015 Study Conclusions  - Left ventricle: The cavity size was normal. Wall thickness was   normal. Systolic function was normal. The estimated ejection   fraction was in the range of 55% to 60%. Wall motion was normal;   there were no regional wall motion abnormalities. - Aortic valve: Trileaflet; mildly thickened, mildly calcified   leaflets. There was moderate regurgitation. - Mitral valve: Moderately calcified annulus. Mildly thickened   leaflets . There was mild regurgitation. - Right atrium: The atrium was moderately dilated. - Tricuspid valve: There was moderate regurgitation. - Pulmonary arteries: Systolic pressure was moderately increased.   PA peak pressure: 55 mm Hg (S).  Impressions:  - Compared to the prior study, there has  been no significant   interval change.   Right/Left Heart Cath and  Coronary Angiography 07/29/15  Conclusion   Ost LAD to Mid LAD lesion, 15% stenosed.  Prox Cx to Mid Cx lesion, 10% stenosed.  The left ventricular systolic function is normal.   1. No significant CAD 2. Normal LV function 3. Moderate aortic insufficiency 4. Normal right heart and LV filling pressures.  Plan: medical management.      EKG:  EKG is not ordered today.   Recent Labs: 08/31/2018: ALT 14; Hemoglobin 13.8; Platelets 146.0; Pro B Natriuretic peptide (BNP) 459.0; TSH 0.94 09/12/2018: BUN 24; Creatinine, Ser 1.23; Potassium 4.5; Sodium 143   Recent Lipid Panel    Component Value Date/Time   CHOL 135 09/14/2013 0852   TRIG 62.0 09/14/2013 0852   HDL 47.00 09/14/2013 0852   CHOLHDL 3 09/14/2013 0852   VLDL 12.4 09/14/2013 0852   LDLCALC 76 09/14/2013 0852    Physical Exam:    VS:  BP 130/62   Pulse 68   Ht 5\' 7"  (1.702 m)   Wt 123 lb 12.8 oz (56.2 kg)   SpO2 96%   BMI 19.39 kg/m     Wt Readings from Last 3 Encounters:  09/12/18 123 lb 12.8 oz (56.2 kg)  06/27/18 128 lb (58.1 kg)  06/20/18 128 lb (58.1 kg)     Physical Exam  Constitutional: He is oriented to person, place, and time. No distress.  Frail, elderly male  HENT:  Head: Normocephalic and atraumatic.  Hard of hearing  Neck: Normal range of motion. Neck supple. No JVD present.  Cardiovascular: Normal rate and intact distal pulses. An irregularly irregular rhythm present.  Murmur heard.  Harsh midsystolic murmur is present with a grade of 3/6 at the upper right sternal border and upper left sternal border. Pulmonary/Chest: Effort normal and breath sounds normal. No respiratory distress. He has no wheezes. He has no rales.  Abdominal: Soft. Bowel sounds are normal.  Musculoskeletal: Normal range of motion.        General: Edema present.     Comments: Trace lower leg edema  Neurological: He is alert and oriented to person,  place, and time.  Skin: Skin is warm and dry.  Psychiatric: He has a normal mood and affect. His behavior is normal. Judgment and thought content normal.  Vitals reviewed.    ASSESSMENT:    1. Chronic diastolic heart failure ([place])   2. Aortic valve insufficiency, etiology of cardiac valve disease unspecified   3. Longstanding persistent atrial fibrillation   4. Pulmonary hypertension ([place])    PLAN:    In order of problems listed above:  1. Diastolic HF -Shortness of breath and fatigue -BNP up at 459 per PCP on 5/21. Diuretic switched to torsemide 20 mg BID and increase KDur. Pt has only taken about 2 doses.  -May be multifactorial related to worsening aortic insufficiency, afib, pulm HTN. Pt has had several years of complaints of fatigue. No CAD by cath in 07/2015.   -Pt has mild LE edema but he denies any significant shortness of breath or orthopnea.  -Will check BMet today since increased diuresis. -continue to treat medically -I discussed watching for dehydration since his body wt is so low. I told him to call our office if he feels like he is getting dry or dehydrated.  2. Aortic insufficiency -Moderate by Cath and echo in 2017.  -Pt deemed not a surgical candidate due to age and frailty per Dr. Acie Fredrickson.   3. Atrial fibrillation  -Rate controlled with beta blocker.  -Anticoagulated with  apixaban 2.5 mg bid (reduced dose for age and wt <60 kg)  4. Pulmonary hypertension -Currently stable. Continue with diuretic and monitor.   Medication Adjustments/Labs and Tests Ordered: Current medicines are reviewed at length with the patient today.  Concerns regarding medicines are outlined above. Labs and tests ordered and medication changes are outlined in the patient instructions below:  Patient Instructions  Medication Instructions:  Your physician recommends that you continue on your current medications as directed. Please refer to the Current Medication list given to you today.   If you need a refill on your cardiac medications before your next appointment, please call your pharmacy.   Lab work: TODAY: BMET  If you have labs (blood work) drawn today and your tests are completely normal, you will receive your results only by: [name] MyChart Message (if you have MyChart) OR . A paper copy in the mail If you have any lab test that is abnormal or we need to change your treatment, we will call you to review the results.  Testing/Procedures: None  Follow-Up: At [facility], you and your health needs are our priority.  As part of our continuing mission to provide you with exceptional heart care, we have created designated Provider Care Teams.  These Care Teams include your primary Cardiologist (physician) and Advanced Practice Providers (APPs -  Physician Assistants and Nurse Practitioners) who all work together to provide you with the care you need, when you need it. You will need a follow up appointment in:  2 weeks. You may see Mertie Moores, MD or one of the following Advanced Practice Providers on your designated Care Team: Richardson Dopp, PA-C [place], [place] . Daune Perch, NP  Any Other Special Instructions Will Be Listed Below (If Applicable).       Signed, Daune Perch, NP  09/12/2018 4:42 PM    [place]

## 2018-09-12 ENCOUNTER — Encounter: Payer: Self-pay | Admitting: Cardiology

## 2018-09-12 ENCOUNTER — Ambulatory Visit (INDEPENDENT_AMBULATORY_CARE_PROVIDER_SITE_OTHER): Payer: Medicare Other | Admitting: Cardiology

## 2018-09-12 ENCOUNTER — Other Ambulatory Visit: Payer: Self-pay

## 2018-09-12 VITALS — BP 130/62 | HR 68 | Ht 67.0 in | Wt 123.8 lb

## 2018-09-12 DIAGNOSIS — I5032 Chronic diastolic (congestive) heart failure: Secondary | ICD-10-CM | POA: Diagnosis not present

## 2018-09-12 DIAGNOSIS — I351 Nonrheumatic aortic (valve) insufficiency: Secondary | ICD-10-CM | POA: Diagnosis not present

## 2018-09-12 DIAGNOSIS — I10 Essential (primary) hypertension: Secondary | ICD-10-CM | POA: Diagnosis not present

## 2018-09-12 DIAGNOSIS — I4811 Longstanding persistent atrial fibrillation: Secondary | ICD-10-CM

## 2018-09-12 DIAGNOSIS — I272 Pulmonary hypertension, unspecified: Secondary | ICD-10-CM

## 2018-09-12 LAB — BASIC METABOLIC PANEL
BUN/Creatinine Ratio: 20 (ref 10–24)
BUN: 24 mg/dL (ref 8–27)
CO2: 29 mmol/L (ref 20–29)
Calcium: 10 mg/dL (ref 8.6–10.2)
Chloride: 100 mmol/L (ref 96–106)
Creatinine, Ser: 1.23 mg/dL (ref 0.76–1.27)
GFR calc Af Amer: 60 mL/min/{1.73_m2} (ref >59)
GFR calc non Af Amer: 52 mL/min/{1.73_m2} — ABNORMAL LOW (ref >59)
Glucose: 97 mg/dL (ref 65–99)
Potassium: 4.5 mmol/L (ref 3.5–5.2)
Sodium: 143 mmol/L (ref 134–144)

## 2018-09-12 NOTE — Patient Instructions (Signed)
Medication Instructions:  Your physician recommends that you continue on your current medications as directed. Please refer to the Current Medication list given to you today.  If you need a refill on your cardiac medications before your next appointment, please call your pharmacy.   Lab work: TODAY: BMET  If you have labs (blood work) drawn today and your tests are completely normal, you will receive your results only by: [name] MyChart Message (if you have MyChart) OR . A paper copy in the mail If you have any lab test that is abnormal or we need to change your treatment, we will call you to review the results.  Testing/Procedures: None  Follow-Up: At [facility], you and your health needs are our priority.  As part of our continuing mission to provide you with exceptional heart care, we have created designated Provider Care Teams.  These Care Teams include your primary Cardiologist (physician) and Advanced Practice Providers (APPs -  Physician Assistants and Nurse Practitioners) who all work together to provide you with the care you need, when you need it. You will need a follow up appointment in:  2 weeks. You may see Mertie Moores, MD or one of the following Advanced Practice Providers on your designated Care Team: Richardson Dopp, PA-C [place], [place] . Daune Perch, NP  Any Other Special Instructions Will Be Listed Below (If Applicable).

## 2018-09-21 NOTE — Therapy (Signed)
[facility] [address] [place], [place], [address] Phone: [phone]   Fax:  [phone]  Physical Therapy Treatment  Patient Details  Name: Jeremy Kelley MRN: [id number] Date of Birth: 10/26/1926 No data recorded  Encounter Date: 07/21/2016    Past Medical History:  Diagnosis Date  . Arthritis   . Carotid artery stenosis 11/2008   bilateral 40-59% stenosis  . Colon polyps   . Diverticulosis   . Hepatitis    doesn't know which type 1969  . Hyperlipidemia   . Hypertension   . Numbness    fingertips  . Phimosis   . Prostate cancer ([place]) 2001  . Urothelial carcinoma of left distal ureter [facility])     Past Surgical History:  Procedure Laterality Date  . BIOPSY  10/01/2011   Procedure: BIOPSY;  Surgeon: [name], MD;  [facility];  Service: Urology;;  biopsy of bladder tumor  . BLADDER SUSPENSION  08/18/10   [name]  . CARDIAC CATHETERIZATION N/A 07/29/2015   Procedure: Right/Left Heart Cath and Coronary Angiography;  Surgeon: Peter M Martinique, MD;  Location: [facility];  Service: Cardiovascular;  Laterality: N/A;  . CATARACT EXTRACTION W/ INTRAOCULAR LENS  IMPLANT, BILATERAL Bilateral   . CYSTOSCOPY W/ RETROGRADES  01/27/2012   Procedure: CYSTOSCOPY WITH RETROGRADE PYELOGRAM;  Surgeon: [name], MD;  [facility];  Service: Urology;  Laterality: Left;  . CYSTOSCOPY WITH RETROGRADE PYELOGRAM, URETEROSCOPY AND STENT PLACEMENT Left 11/24/2012   Procedure: CYSTOSCOPY WITH left RETROGRADE PYELOGRAM, bladder washings, ureteroscopy;  Surgeon: [name], MD;  [facility];  Service: Urology;  Laterality: Left;  . CYSTOSCOPY WITH RETROGRADE PYELOGRAM, URETEROSCOPY AND STENT PLACEMENT Bilateral 11/19/2013   Procedure: CYSTOSCOPY WITH BILATERAL RETROGRADE PYELOGRAM,;  Surgeon: [name], MD;  [facility];  Service: Urology;  Laterality: Bilateral;  . EYE SURGERY  09/24/10   left eye.  "Retina surgery"  . INGUINAL HERNIA REPAIR  1960  . PROSTATE SURGERY     s/p laparoscopic prostate surgery  . SKIN BIOPSY  05/20/2017   Superficial and nodular basal cell carcinoma (left temple)    basal cell carcinoma with sclerosis (right temple)  . SKIN CANCER EXCISION     a/p skin cancer right foot-non melanoma  . skin laceration    . TEE WITHOUT CARDIOVERSION N/A 07/29/2015   Procedure: TRANSESOPHAGEAL ECHOCARDIOGRAM (TEE);  Surgeon: Pixie Casino, MD;  Location[facility] END[facility]COPY;  Service: Cardiovascular;  Laterality: N/A;  cath to follow  . TRANSURETHRAL RESECTION OF BLADDER TUMOR  10/01/2011   Procedure: TRANSURETHRAL RESECTION OF BLADDER TUMOR (TURBT);  Surgeon: [name], MD;  [facility];  Service: Urology;  Laterality: N/A;  . TRANSURETHRAL RESECTION OF BLADDER TUMOR N/A 11/19/2013   Procedure: TRANSURETHRAL RESECTION OF BLADDER TUMOR (TURBT);  Surgeon: [name], MD;  [facility];  Service: Urology;  Laterality: N/A;  . URETEROSCOPY  01/27/2012   Procedure: URETEROSCOPY;  Surgeon: [name], MD;  [facility];  Service: Urology;  Laterality: Left;  CYSTO, Left RETROGRADE PYELOGRPHY, LEFT URETEROSCOPY   . URINARY SPHINCTER IMPLANT    . VARICOSE VEIN SURGERY      There were no vitals filed for this visit.                              PT Short Term Goals - 08/11/16 1812      PT SHORT TERM GOAL #1  Title  independent with initial home exercise program for flexibility and postural strength    Baseline  needs cues/ clarification.  He has too many.    Time  4    Period  Weeks    Status  On-going      PT SHORT TERM GOAL #2   Title  ambulate for 30 minutes with upright posture and pain decreased >/= 25%    Baseline  discomfort vs pain.    Time  4    Period  Weeks    Status  On-going        PT Long Term Goals - 07/19/16 1516      PT LONG TERM GOAL #2   Title  ability to walk for 45 minutes with back pain decreased >/= 75%  due to improve posture and strength    Time  8    Period  Weeks    Status  On-going   Nothing consistent             Patient will benefit from skilled therapeutic intervention in order to improve the following deficits and impairments:     Visit Diagnosis: Muscle weakness (generalized)  Abnormal posture  Other abnormalities of gait and mobility  Chronic bilateral low back pain without sciatica     Problem List Patient Active Problem List   Diagnosis Date Noted  . Closed pilon fracture, left, sequela 10/11/2016  . Pain in left ankle and joints of left foot 10/04/2016  . 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 Fatigue 11/04/2015  . CAD (coronary artery disease) 08/01/2015  . Pulmonary hypertension ([place]) 07/24/2015  . Atrial fibrillation ([place]) 07/16/2015  . Aortic insufficiency 01/04/2014  . Squamous cell carcinoma in situ of skin of right lower leg 11/13/2013  . Lentigo maligna of cheek ([place]) 09/19/2013  . Actinic keratosis 11/06/2012  . Macular degeneration 08/10/2012  . Allergic rhinitis 08/10/2012  . Bladder cancer ([place]) 10/19/2011  . Eczema 02/23/2011  . Hyperglycemia 02/11/2011  . Heart murmur 09/10/2010  . ABNORMAL THYROID FUNCTION TESTS 08/05/2009  . ABNORMAL HEART RHYTHMS 07/11/2009  . Hyperlipidemia 11/28/2008  . CAROTID BRUIT 11/19/2008  . UNSPECIFIED CATARACT 10/17/2008  . PARESTHESIA 12/04/2007  . Varicose veins of bilateral lower extremities with other complications 14/78/2956  . COLONIC POLYPS, HX OF 08/28/2007  . Essential hypertension 02/08/2007  . PROSTATE CANCER, HX OF 11/04/2006  . INGUINAL HERNIA, HX OF 11/04/2006    Berk Pilot, PTA 09/21/2018, 9:08 AM  [facility] [address], [place], [place], [address] Phone: [phone]   Fax:  [phone]  Name: Jeremy Kelley MRN: [id number] Date of  Birth: 1926/06/12

## 2018-09-26 ENCOUNTER — Ambulatory Visit (INDEPENDENT_AMBULATORY_CARE_PROVIDER_SITE_OTHER): Payer: Medicare Other | Admitting: Cardiology

## 2018-09-26 ENCOUNTER — Other Ambulatory Visit: Payer: Self-pay

## 2018-09-26 ENCOUNTER — Encounter: Payer: Self-pay | Admitting: Cardiology

## 2018-09-26 VITALS — BP 110/46 | HR 62 | Ht 67.0 in | Wt 118.0 lb

## 2018-09-26 DIAGNOSIS — I351 Nonrheumatic aortic (valve) insufficiency: Secondary | ICD-10-CM

## 2018-09-26 DIAGNOSIS — I5032 Chronic diastolic (congestive) heart failure: Secondary | ICD-10-CM

## 2018-09-26 DIAGNOSIS — I272 Pulmonary hypertension, unspecified: Secondary | ICD-10-CM

## 2018-09-26 DIAGNOSIS — I4821 Permanent atrial fibrillation: Secondary | ICD-10-CM

## 2018-09-26 DIAGNOSIS — I10 Essential (primary) hypertension: Secondary | ICD-10-CM | POA: Diagnosis not present

## 2018-09-26 LAB — BASIC METABOLIC PANEL
BUN/Creatinine Ratio: 21 (ref 10–24)
BUN: 26 mg/dL (ref 8–27)
CO2: 30 mmol/L — ABNORMAL HIGH (ref 20–29)
Calcium: 10.6 mg/dL — ABNORMAL HIGH (ref 8.6–10.2)
Chloride: 100 mmol/L (ref 96–106)
Creatinine, Ser: 1.22 mg/dL (ref 0.76–1.27)
GFR calc Af Amer: 60 mL/min/{1.73_m2} (ref >59)
GFR calc non Af Amer: 52 mL/min/{1.73_m2} — ABNORMAL LOW (ref >59)
Glucose: 97 mg/dL (ref 65–99)
Potassium: 4 mmol/L (ref 3.5–5.2)
Sodium: 146 mmol/L — ABNORMAL HIGH (ref 134–144)

## 2018-09-26 NOTE — Progress Notes (Addendum)
Cardiology Office Note:    Date:  09/26/2018   ID:  Jeremy Kelley, DOB 23-Jul-1926, MRN [id number]  PCP:  [name], NP  Cardiologist:  Mertie Moores, MD  Referring MD: [name], NP   Chief Complaint  Patient presents with  . Follow-up    dCHF    History of Present Illness:    Jeremy Kelley is a 85 y.o. male with a past medical history significant for CHF, atrial fibrillation, hyperlipidemia, Raynaud's, carotid artery disease (40-50% bilaterally) and aortic insufficiency.   He has had several years of complaints of progressive fatigue with walking. R&L heart cath in 07/2015 showed no significant CAD and normal right heart filling pressures. He did have moderate aortic insufficiency. Last echo 12/08/2015 showed normal LV systolic function with EF 55-60% and moderate aortic regurgitation essentially unchanged from prior.   He was last seen in the office by Dr. Acie Fredrickson on 06/08/2018 at which time he was having generalized weakness. He was to start PT.  He was advised to limit salt. He eats out for every meal. He was continued on rate control and anticoagulation for afib. It was noted that due to his overall frailty he is not a candidate for aortic valve replacement. He was noted to have stable pulmonary hypertension. He was noted to have multiple cardiac reasons which all likely contribute to his fatigue along with his advanced age.   On 09/06/2018 the patient called the office to report that his PCP had checked a ProBNP which was elevated. He noted that his breathing and symptoms had been worse over the past few weeks.  Dr. Acie Fredrickson advised to start torsemide 20 mg BID as lasix had not been providing good diuresis. KDur was also increased.   I saw him in the office on 09/12/2018. Breathing was normal but he was feeling fatigued. He had mild LE edema. He was having blurred vision and at evaluation by ophthalmology he was told that torsemide could cause his issue so he had stopped  taking it. He was then seen by PCP with c/o shortness of breath and noted to have elevated BNP. He was told to take torsemide. When I saw him he had taken 2 doses of torsemide.  I spent time going over all of his medications that he had brought with him and wrote out a medication schedule for him to follow. I was concerned that with his low body wt, he may get dehydrated easily. His labs showed stable renal function and normal potassium.   Jeremy Kelley is here today for close follow up. He reports medication side effects. He stopped oxybutynin and is taking increased torsemide and is now unable to control his voiding, dripping all the time. His blurred vision is little better. He is using eye drops for dry eyes. He is having increased fatigue and falling asleep frequently. He has no desire to get out and walk and this bothers him.  His breathing is at his baseline- "not an issue". He has no LE swelling. He is havign dizziness upon standing, no falls.    Past Medical History:  Diagnosis Date  . Arthritis   . Carotid artery stenosis 11/2008   bilateral 40-59% stenosis  . Colon polyps   . Diverticulosis   . Hepatitis    doesn't know which type 1969  . Hyperlipidemia   . Hypertension   . Numbness    fingertips  . Phimosis   . Prostate cancer ([place]) 2001  . Urothelial carcinoma of  left distal ureter [facility])     Past Surgical History:  Procedure Laterality Date  . BIOPSY  10/01/2011   Procedure: BIOPSY;  Surgeon: [name], MD;  Location: [facility];  Service: Urology;;  biopsy of bladder tumor  . BLADDER SUSPENSION  08/18/10   [name]  . CARDIAC CATHETERIZATION N/A 07/29/2015   Procedure: Right/Left Heart Cath and Coronary Angiography;  Surgeon: Peter M Martinique, MD;  Location: [facility];  Service: Cardiovascular;  Laterality: N/A;  . CATARACT EXTRACTION W/ INTRAOCULAR LENS  IMPLANT, BILATERAL Bilateral   . CYSTOSCOPY W/ RETROGRADES  01/27/2012   Procedure: CYSTOSCOPY WITH RETROGRADE  PYELOGRAM;  Surgeon: [name], MD;  Location: [facility];  Service: Urology;  Laterality: Left;  . CYSTOSCOPY WITH RETROGRADE PYELOGRAM, URETEROSCOPY AND STENT PLACEMENT Left 11/24/2012   Procedure: CYSTOSCOPY WITH left RETROGRADE PYELOGRAM, bladder washings, ureteroscopy;  Surgeon: [name], MD;  Location: [facility];  Service: Urology;  Laterality: Left;  . CYSTOSCOPY WITH RETROGRADE PYELOGRAM, URETEROSCOPY AND STENT PLACEMENT Bilateral 11/19/2013   Procedure: CYSTOSCOPY WITH BILATERAL RETROGRADE PYELOGRAM,;  Surgeon: [name], MD;  Location: [facility];  Service: Urology;  Laterality: Bilateral;  . EYE SURGERY  09/24/10   left eye. "Retina surgery"  . INGUINAL HERNIA REPAIR  1960  . PROSTATE SURGERY     s/p laparoscopic prostate surgery  . SKIN BIOPSY  05/20/2017   Superficial and nodular basal cell carcinoma (left temple)    basal cell carcinoma with sclerosis (right temple)  . SKIN CANCER EXCISION     a/p skin cancer right foot-non melanoma  . skin laceration    . TEE WITHOUT CARDIOVERSION N/A 07/29/2015   Procedure: TRANSESOPHAGEAL ECHOCARDIOGRAM (TEE);  Surgeon: Pixie Casino, MD;  Location: [facility];  Service: Cardiovascular;  Laterality: N/A;  cath to follow  . TRANSURETHRAL RESECTION OF BLADDER TUMOR  10/01/2011   Procedure: TRANSURETHRAL RESECTION OF BLADDER TUMOR (TURBT);  Surgeon: [name], MD;  Location: [facility];  Service: Urology;  Laterality: N/A;  . TRANSURETHRAL RESECTION OF BLADDER TUMOR N/A 11/19/2013   Procedure: TRANSURETHRAL RESECTION OF BLADDER TUMOR (TURBT);  Surgeon: [name], MD;  Location: [facility];  Service: Urology;  Laterality: N/A;  . URETEROSCOPY  01/27/2012   Procedure: URETEROSCOPY;  Surgeon: [name], MD;  Location: [facility];  Service: Urology;  Laterality: Left;  CYSTO, Left RETROGRADE PYELOGRPHY, LEFT URETEROSCOPY   . URINARY SPHINCTER IMPLANT    . VARICOSE VEIN SURGERY      Current Medications: Current Meds  Medication Sig  . apixaban (ELIQUIS)  2.5 MG TABS tablet Take 1 tablet (2.5 mg total) by mouth 2 (two) times daily.  . B Complex-C (B-COMPLEX WITH VITAMIN C) tablet Take 1 tablet by mouth daily. Reported on 07/24/2015  . cholecalciferol (VITAMIN D) 1000 units tablet Take 1 tablet (1,000 Units total) by mouth daily.  [name] KLOR-CON M10 10 MEQ tablet Take 1 tablet (10 mEq total) by mouth 2 (two) times daily.  . methylcellulose (ARTIFICIAL TEARS) 1 % ophthalmic solution Place 1 drop into both eyes 2 (two) times daily.   . metoprolol succinate (TOPROL-XL) 25 MG 24 hr tablet Take 1 tablet (25 mg total) by mouth at bedtime.  . pravastatin (PRAVACHOL) 20 MG tablet Take 1 tablet (20 mg total) by mouth at bedtime.  . torsemide (DEMADEX) 20 MG tablet Take 1 tablet (20 mg total) by mouth 2 (two) times daily.     Allergies:   Patient has no known allergies.   Social History  Socioeconomic History  . Marital status: Single    Spouse name: Not on file  . Number of children: 0  . Years of education: Not on file  . Highest education level: Not on file  Occupational History  . Occupation: Retired  Scientific laboratory technician  . Financial resource strain: Not on file  . Food insecurity    Worry: Not on file    Inability: Not on file  . Transportation needs    Medical: Not on file    Non-medical: Not on file  Tobacco Use  . Smoking status: Former Research scientist (life sciences)  . Smokeless tobacco: Never Used  . Tobacco comment: 07/29/2015 "might smoke a cigar once/year"  Substance and Sexual Activity  . Alcohol use: Yes    Alcohol/week: 14.0 standard drinks    Types: 14 Glasses of wine per week    Comment: 07/29/2015 "big glass of wine q night"  . Drug use: No  . Sexual activity: Not on file  Lifestyle  . Physical activity    Days per week: Not on file    Minutes per session: Not on file  . Stress: Not on file  Relationships  . Social Herbalist on phone: Not on file    Gets together: Not on file    Attends religious service: Not on file    Active member  of club or organization: Not on file    Attends meetings of clubs or organizations: Not on file    Relationship status: Not on file  Other Topics Concern  . Not on file  Social History Narrative   Patient is single, has never been married.  Does not have any children.  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   He drinks approximately 3-4 glasses of wine per week.  No history of excessive alcohol use.  Occasionally smokes a cigar.   Spends winters in [place]          Family History: The patient's family history is negative for Colon cancer and Colon polyps. ROS:   Please see the history of present illness.     All other systems reviewed and are negative.  EKGs/Labs/Other Studies Reviewed:    The following studies were reviewed today:  Echo 12/08/2015 Study Conclusions  - Left ventricle: The cavity size was normal. Wall thickness was normal. Systolic function was normal. The estimated ejection fraction was in the range of 55% to 60%. Wall motion was normal; there were no regional wall motion abnormalities. - Aortic valve: Trileaflet; mildly thickened, mildly calcified leaflets. There was moderate regurgitation. - Mitral valve: Moderately calcified annulus. Mildly thickened leaflets . There was mild regurgitation. - Right atrium: The atrium was moderately dilated. - Tricuspid valve: There was moderate regurgitation. - Pulmonary arteries: Systolic pressure was moderately increased. PA peak pressure: 55 mm Hg (S).  Impressions:  - Compared to the prior study, there has been no significant interval change.   Right/Left Heart Cath and Coronary Angiography 07/29/15  Conclusion   Ost LAD to Mid LAD lesion, 15% stenosed.  Prox Cx to Mid Cx lesion, 10% stenosed.  The left ventricular systolic function is normal.  1. No significant CAD 2. Normal LV function 3. Moderate aortic insufficiency 4. Normal right heart and LV filling pressures.   Plan: medical management.     EKG:  EKG is not ordered today.   Recent Labs: 08/31/2018: ALT 14; Hemoglobin 13.8; Platelets 146.0; Pro B Natriuretic peptide (BNP) 459.0; TSH 0.94 09/26/2018: BUN 26;  Creatinine, Ser 1.22; Potassium 4.0; Sodium 146   Recent Lipid Panel    Component Value Date/Time   CHOL 135 09/14/2013 0852   TRIG 62.0 09/14/2013 0852   HDL 47.00 09/14/2013 0852   CHOLHDL 3 09/14/2013 0852   VLDL 12.4 09/14/2013 0852   LDLCALC 76 09/14/2013 0852    Physical Exam:    VS:  BP (!) 110/46   Pulse 62   Ht 5\' 7"  (1.702 m)   Wt 118 lb (53.5 kg)   SpO2 98%   BMI 18.48 kg/m     Wt Readings from Last 3 Encounters:  09/26/18 118 lb (53.5 kg)  09/12/18 123 lb 12.8 oz (56.2 kg)  06/27/18 128 lb (58.1 kg)     Physical Exam  Constitutional: He is oriented to person, place, and time. No distress.  Frail, thin, elderly male  HENT:  Head: Atraumatic.  Neck: Normal range of motion. Neck supple. No JVD present.  Cardiovascular: Normal rate, normal heart sounds and intact distal pulses. An irregularly irregular rhythm present.  Pulmonary/Chest: Effort normal and breath sounds normal. No respiratory distress. He has no wheezes. He has no rales.  Abdominal: Soft. Bowel sounds are normal.  Musculoskeletal:        General: No edema.  Neurological: He is alert and oriented to person, place, and time.  Skin: Skin is warm and dry.  Psychiatric: He has a normal mood and affect. His behavior is normal. Judgment and thought content normal.     ASSESSMENT:    1. Chronic diastolic heart failure ([place])   2. Essential hypertension   3. Nonrheumatic aortic valve insufficiency   4. Permanent atrial fibrillation   5. Pulmonary hypertension ([place])    PLAN:    In order of problems listed above:  1. Diastolic HF -Shortness of breath and fatigue -BNP up at 459 per PCP on 5/21. Diuretic switched to torsemide 20 mg BID and increase KDur. Pt has only taken about 2 doses.  -May  be multifactorial related to worsening aortic insufficiency, afib, pulm HTN. Pt has had several years of complaints of fatigue. No CAD by cath in 07/2015.   -Pt has mild LE edema but he denies any significant shortness of breath or orthopnea.  -Will check BMet today since increased diuresis. -continue to treat medically -I discussed watching for dehydration since his body wt is so low. I told him to call our office if he feels like he is getting dry or dehydrated. -Will reduce torsemide dose. Pt wants to stop diuretic but I think he still needs some diuresis due to valve disease and pulm HTN.   2. Aortic insufficiency -Moderate by Cath and echo in 2017.  -Pt deemed not a surgical candidate due to age and frailty per Dr. Acie Fredrickson.   3. Atrial fibrillation  -Rate controlled with beta blocker.  -Anticoagulated with apixaban 2.5 mg bid (reduced dose for age and wt <60 kg)  4. Pulmonary hypertension -Currently stable. Continue with diuretic and monitor.    Medication Adjustments/Labs and Tests Ordered: Current medicines are reviewed at length with the patient today.  Concerns regarding medicines are outlined above. Labs and tests ordered and medication changes are outlined in the patient instructions below:  Patient Instructions  Medication Instructions:  Your physician recommends that you continue on your current medications as directed. Please refer to the Current Medication list given to you today.  If you need a refill on your cardiac medications before your next appointment, please call your pharmacy.  Lab work: TODAY: BMET  If you have labs (blood work) drawn today and your tests are completely normal, you will receive your results only by: [name] MyChart Message (if you have MyChart) OR . A paper copy in the mail If you have any lab test that is abnormal or we need to change your treatment, we will call you to review the results.  Testing/Procedures: None  Follow-Up: At [facility], you and your health needs are our priority.  As part of our continuing mission to provide you with exceptional heart care, we have created designated Provider Care Teams.  These Care Teams include your primary Cardiologist (physician) and Advanced Practice Providers (APPs -  Physician Assistants and Nurse Practitioners) who all work together to provide you with the care you need, when you need it. You will need a follow up appointment in:  1 months (Can be virtual) You may see Mertie Moores, MD or one of the following Advanced Practice Providers on your designated Care Team: Richardson Dopp, PA-C [place], [place] . Daune Perch, NP  Any Other Special Instructions Will Be Listed Below (If Applicable).  Do the following things EVERY DAY:   1. Weigh yourself EVERY morning after you go to the bathroom but before you eat or drink anything. Write this number down in a weight log/diary. If you gain 3 pounds overnight or 5 pounds in a week, take an extra dose of torsemide. If this does not get the fluid off, call the office.   2. Take your medicines as prescribed. If you have concerns about your medications, please call us before you stop taking them.    3. Eat low salt foods-Limit salt (sodium) to 2000 mg per day. This will help prevent your body from holding onto fluid. Read food labels as many processed foods have a lot of sodium, especially canned goods and prepackaged meats. If you would like some assistance choosing low sodium foods, we would be happy to set you up with a nutritionist.   4. Stay as active as you can everyday. Staying active will give you more energy and make your muscles stronger. Start with 5 minutes at a time and work your way up to 30 minutes a day. Break up your activities--do some in the morning and some in the afternoon. Start with 3 days per week and work your way up to 5 days as you can.  If you have chest pain, feel short of breath, dizzy, or lightheaded, STOP. If  you don't feel better after a short rest, call 911. If you do feel better, call the office to let us know you have symptoms with exercise.   5. Limit all fluids for the day to less than 2 liters. Fluid includes all drinks, coffee, juice, ice chips, soup, jello, and all other liquids.  =========================================  Pecolia Ades, NP      Signed, Daune Perch, NP  09/27/2018 1:58 PM    [facility]

## 2018-09-26 NOTE — Patient Instructions (Addendum)
Medication Instructions:  Your physician recommends that you continue on your current medications as directed. Please refer to the Current Medication list given to you today.  If you need a refill on your cardiac medications before your next appointment, please call your pharmacy.   Lab work: TODAY: BMET  If you have labs (blood work) drawn today and your tests are completely normal, you will receive your results only by: [name] MyChart Message (if you have MyChart) OR . A paper copy in the mail If you have any lab test that is abnormal or we need to change your treatment, we will call you to review the results.  Testing/Procedures: None  Follow-Up: At [facility], you and your health needs are our priority.  As part of our continuing mission to provide you with exceptional heart care, we have created designated Provider Care Teams.  These Care Teams include your primary Cardiologist (physician) and Advanced Practice Providers (APPs -  Physician Assistants and Nurse Practitioners) who all work together to provide you with the care you need, when you need it. You will need a follow up appointment in:  1 months (Can be virtual) You may see Mertie Moores, MD or one of the following Advanced Practice Providers on your designated Care Team: Richardson Dopp, PA-C [place], [place] . Daune Perch, NP  Any Other Special Instructions Will Be Listed Below (If Applicable).  Do the following things EVERY DAY:   1. Weigh yourself EVERY morning after you go to the bathroom but before you eat or drink anything. Write this number down in a weight log/diary. If you gain 3 pounds overnight or 5 pounds in a week, take an extra dose of torsemide. If this does not get the fluid off, call the office.   2. Take your medicines as prescribed. If you have concerns about your medications, please call us before you stop taking them.    3. Eat low salt foods-Limit salt (sodium) to 2000 mg per day. This will help  prevent your body from holding onto fluid. Read food labels as many processed foods have a lot of sodium, especially canned goods and prepackaged meats. If you would like some assistance choosing low sodium foods, we would be happy to set you up with a nutritionist.   4. Stay as active as you can everyday. Staying active will give you more energy and make your muscles stronger. Start with 5 minutes at a time and work your way up to 30 minutes a day. Break up your activities--do some in the morning and some in the afternoon. Start with 3 days per week and work your way up to 5 days as you can.  If you have chest pain, feel short of breath, dizzy, or lightheaded, STOP. If you don't feel better after a short rest, call 911. If you do feel better, call the office to let us know you have symptoms with exercise.   5. Limit all fluids for the day to less than 2 liters. Fluid includes all drinks, coffee, juice, ice chips, soup, jello, and all other liquids.  =========================================  Pecolia Ades, NP

## 2018-10-02 ENCOUNTER — Other Ambulatory Visit: Payer: Self-pay | Admitting: Adult Health

## 2018-10-02 DIAGNOSIS — I10 Essential (primary) hypertension: Secondary | ICD-10-CM

## 2018-10-02 DIAGNOSIS — Z760 Encounter for issue of repeat prescription: Secondary | ICD-10-CM

## 2018-10-03 DIAGNOSIS — H532 Diplopia: Secondary | ICD-10-CM | POA: Diagnosis not present

## 2018-10-03 DIAGNOSIS — H02532 Eyelid retraction right lower eyelid: Secondary | ICD-10-CM | POA: Diagnosis not present

## 2018-10-03 DIAGNOSIS — H02535 Eyelid retraction left lower eyelid: Secondary | ICD-10-CM | POA: Diagnosis not present

## 2018-10-03 DIAGNOSIS — H16143 Punctate keratitis, bilateral: Secondary | ICD-10-CM | POA: Diagnosis not present

## 2018-10-03 DIAGNOSIS — H26493 Other secondary cataract, bilateral: Secondary | ICD-10-CM | POA: Diagnosis not present

## 2018-10-03 DIAGNOSIS — H353132 Nonexudative age-related macular degeneration, bilateral, intermediate dry stage: Secondary | ICD-10-CM | POA: Diagnosis not present

## 2018-10-03 DIAGNOSIS — H5010 Unspecified exotropia: Secondary | ICD-10-CM | POA: Diagnosis not present

## 2018-10-03 DIAGNOSIS — Z961 Presence of intraocular lens: Secondary | ICD-10-CM | POA: Diagnosis not present

## 2018-10-03 DIAGNOSIS — H35373 Puckering of macula, bilateral: Secondary | ICD-10-CM | POA: Diagnosis not present

## 2018-10-04 ENCOUNTER — Ambulatory Visit (INDEPENDENT_AMBULATORY_CARE_PROVIDER_SITE_OTHER): Payer: Medicare Other | Admitting: Adult Health

## 2018-10-04 ENCOUNTER — Other Ambulatory Visit: Payer: Self-pay

## 2018-10-04 ENCOUNTER — Encounter: Payer: Self-pay | Admitting: Adult Health

## 2018-10-04 VITALS — BP 116/62 | Temp 98.7°F | Wt 122.0 lb

## 2018-10-04 DIAGNOSIS — I4811 Longstanding persistent atrial fibrillation: Secondary | ICD-10-CM

## 2018-10-04 DIAGNOSIS — I272 Pulmonary hypertension, unspecified: Secondary | ICD-10-CM

## 2018-10-04 DIAGNOSIS — R5383 Other fatigue: Secondary | ICD-10-CM | POA: Diagnosis not present

## 2018-10-04 NOTE — Progress Notes (Signed)
Subjective:    Patient ID: Jeremy Kelley, male    DOB: June 07, 1926, 85 y.o.   MRN: [id number]  HPI 85 year old male who  has a past medical history of Arthritis, Carotid artery stenosis (11/2008), Colon polyps, Diverticulosis, Hepatitis, Hyperlipidemia, Hypertension, Numbness, Phimosis, Prostate cancer ([place]) (2001), and Urothelial carcinoma of left distal ureter ([place]).  He presents today for follow-up.  He was seen roughly a week ago by cardiology for follow-up regarding shortness of breath and fatigue.  At this time torsemide was decreased due to incontinence.  Since the decrease in diuretic he reports that incontinence is less of an issue.  He continues to have shortness of breath and fatigue  Also reports that he was seen by [facility] eye earlier in the week and he will have to have laser surgery to correct a cataract.  Review of Systems See HPI   Past Medical History:  Diagnosis Date  . Arthritis   . Carotid artery stenosis 11/2008   bilateral 40-59% stenosis  . Colon polyps   . Diverticulosis   . Hepatitis    doesn't know which type 1969  . Hyperlipidemia   . Hypertension   . Numbness    fingertips  . Phimosis   . Prostate cancer ([place]) 2001  . Urothelial carcinoma of left distal ureter [facility])     Social History   Socioeconomic History  . Marital status: Single    Spouse name: Not on file  . Number of children: 0  . Years of education: Not on file  . Highest education level: Not on file  Occupational History  . Occupation: Retired  Scientific laboratory technician  . Financial resource strain: Not on file  . Food insecurity    Worry: Not on file    Inability: Not on file  . Transportation needs    Medical: Not on file    Non-medical: Not on file  Tobacco Use  . Smoking status: Former Research scientist (life sciences)  . Smokeless tobacco: Never Used  . Tobacco comment: 07/29/2015 "might smoke a cigar once/year"  Substance and Sexual Activity  . Alcohol use: Yes    Alcohol/week: 14.0 standard drinks   Types: 14 Glasses of wine per week    Comment: 07/29/2015 "big glass of wine q night"  . Drug use: No  . Sexual activity: Not on file  Lifestyle  . Physical activity    Days per week: Not on file    Minutes per session: Not on file  . Stress: Not on file  Relationships  . Social Herbalist on phone: Not on file    Gets together: Not on file    Attends religious service: Not on file    Active member of club or organization: Not on file    Attends meetings of clubs or organizations: Not on file    Relationship status: Not on file  . Intimate partner violence    Fear of current or ex partner: Not on file    Emotionally abused: Not on file    Physically abused: Not on file    Forced sexual activity: Not on file  Other Topics Concern  . Not on file  Social History Narrative   Patient is single, has never been married.  Does not have any children.  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   He drinks approximately 3-4 glasses of wine per week.  No history of excessive alcohol use.  Occasionally smokes a cigar.  Spends winters in [place]         Past Surgical History:  Procedure Laterality Date  . BIOPSY  10/01/2011   Procedure: BIOPSY;  Surgeon: [name], MD;  [facility]: [facility];  Service: Urology;;  biopsy of bladder tumor  . BLADDER SUSPENSION  08/18/10   [name]  . CARDIAC CATHETERIZATION N/A 07/29/2015   Procedure: Right/Left Heart Cath and Coronary Angiography;  Surgeon: Peter M Martinique, MD;  Location: [place] CV LAB;  Service: Cardiovascular;  Laterality: N/A;  . CATARACT EXTRACTION W/ INTRAOCULAR LENS  IMPLANT, BILATERAL Bilateral   . CYSTOSCOPY W/ RETROGRADES  01/27/2012   Procedure: CYSTOSCOPY WITH RETROGRADE PYELOGRAM;  Surgeon: [name], MD;  [facility];  Service: Urology;  Laterality: Left;  . CYSTOSCOPY WITH RETROGRADE PYELOGRAM, URETEROSCOPY AND STENT PLACEMENT Left 11/24/2012   Procedure: CYSTOSCOPY WITH left RETROGRADE  PYELOGRAM, bladder washings, ureteroscopy;  Surgeon: [name], MD;  [facility];  Service: Urology;  Laterality: Left;  . CYSTOSCOPY WITH RETROGRADE PYELOGRAM, URETEROSCOPY AND STENT PLACEMENT Bilateral 11/19/2013   Procedure: CYSTOSCOPY WITH BILATERAL RETROGRADE PYELOGRAM,;  Surgeon: [name], MD;  [facility];  Service: Urology;  Laterality: Bilateral;  . EYE SURGERY  09/24/10   left eye. "Retina surgery"  . INGUINAL HERNIA REPAIR  1960  . PROSTATE SURGERY     s/p laparoscopic prostate surgery  . SKIN BIOPSY  05/20/2017   Superficial and nodular basal cell carcinoma (left temple)    basal cell carcinoma with sclerosis (right temple)  . SKIN CANCER EXCISION     a/p skin cancer right foot-non melanoma  . skin laceration    . TEE WITHOUT CARDIOVERSION N/A 07/29/2015   Procedure: TRANSESOPHAGEAL ECHOCARDIOGRAM (TEE);  Surgeon: Pixie Casino, MD;  [facility] ENDOSCOPY;  Service: Cardiovascular;  Laterality: N/A;  cath to follow  . TRANSURETHRAL RESECTION OF BLADDER TUMOR  10/01/2011   Procedure: TRANSURETHRAL RESECTION OF BLADDER TUMOR (TURBT);  Surgeon: [name], MD;  [facility];  Service: Urology;  Laterality: N/A;  . TRANSURETHRAL RESECTION OF BLADDER TUMOR N/A 11/19/2013   Procedure: TRANSURETHRAL RESECTION OF BLADDER TUMOR (TURBT);  Surgeon: [name], MD;  [facility];  Service: Urology;  Laterality: N/A;  . URETEROSCOPY  01/27/2012   Procedure: URETEROSCOPY;  Surgeon: [name], MD;  [facility];  Service: Urology;  Laterality: Left;  CYSTO, Left RETROGRADE PYELOGRPHY, LEFT URETEROSCOPY   . URINARY SPHINCTER IMPLANT    . VARICOSE VEIN SURGERY      Family History  Problem Relation Age of Onset  . Colon cancer Neg Hx   . Colon polyps Neg Hx     No Known Allergies  Current Outpatient Medications on File Prior to Visit  Medication Sig Dispense Refill  . apixaban (ELIQUIS) 2.5 MG TABS tablet Take 1 tablet (2.5 mg total) by mouth 2 (two) times  daily. 180 tablet 0  . B Complex-C (B-COMPLEX WITH VITAMIN C) tablet Take 1 tablet by mouth daily. Reported on 07/24/2015 90 tablet 3  . cholecalciferol (VITAMIN D) 1000 units tablet Take 1 tablet (1,000 Units total) by mouth daily. 90 tablet 3  . KLOR-CON M10 10 MEQ tablet Take 1 tablet (10 mEq total) by mouth 2 (two) times daily. 180 tablet 3  . methylcellulose (ARTIFICIAL TEARS) 1 % ophthalmic solution Place 1 drop into both eyes 2 (two) times daily.     . metoprolol succinate (TOPROL-XL) 25 MG 24 hr tablet Take 1 tablet (25 mg total) by mouth at bedtime.  90 tablet 0  . pravastatin (PRAVACHOL) 20 MG tablet Take 1 tablet (20 mg total) by mouth at bedtime. 90 tablet 0  . torsemide (DEMADEX) 20 MG tablet Take 1 tablet (20 mg total) by mouth 2 (two) times daily. 180 tablet 3   No current facility-administered medications on file prior to visit.     BP 116/62   Temp 98.7 F (37.1 C)   Wt 122 lb (55.3 kg)   BMI 19.11 kg/m       Objective:   Physical Exam Vitals signs and nursing note reviewed.  Constitutional:      Appearance: Normal appearance.  Cardiovascular:     Rate and Rhythm: Rhythm irregularly irregular.     Pulses: Normal pulses.     Heart sounds: Normal heart sounds. No murmur. No friction rub. No gallop.   Pulmonary:     Effort: Pulmonary effort is normal.     Breath sounds: Normal breath sounds.  Musculoskeletal: Normal range of motion.  Skin:    General: Skin is warm and dry.  Neurological:     General: No focal deficit present.     Mental Status: He is alert and oriented to person, place, and time.     Cranial Nerves: No cranial nerve deficit.     Sensory: No sensory deficit.     Motor: No weakness.     Coordination: Coordination normal.     Gait: Gait normal.     Deep Tendon Reflexes: Reflexes normal.  Psychiatric:        Mood and Affect: Mood normal.        Behavior: Behavior normal.        Thought Content: Thought content normal.        Judgment: Judgment  normal.       Assessment & Plan:  -Advised to continue with cardiology plan of care.  Again he was educated on his comorbid conditions and likely fatigue and shortness of breath will not improve. -Follow up PRN  [name], NP

## 2018-10-10 ENCOUNTER — Telehealth: Payer: Self-pay | Admitting: Physician Assistant

## 2018-10-10 ENCOUNTER — Telehealth: Payer: Self-pay

## 2018-10-10 ENCOUNTER — Telehealth: Payer: Self-pay | Admitting: Gastroenterology

## 2018-10-10 NOTE — Telephone Encounter (Signed)
New message          COVID-19 Pre-Screening Questions:   In the past 7 to 10 days have you had a cough,  shortness of breath, headache, congestion, fever (100 or greater) body aches, chills, sore throat, or sudden loss of taste or sense of smell? NO  Have you been around anyone with known Covid 19. NO  Have you been around anyone who is awaiting Covid 19 test results in the past 7 to 10 days? YES, ONE RELATIVE WAS TESTED BUT TESTED NEGATIVE   Have you been around anyone who has been exposed to Covid 19, or has mentioned symptoms of Covid 19 within the past 7 to 10 days? NO  If you have any concerns/questions about symptoms patients report during screening (either on the phone or at threshold). Contact the provider seeing the patient or DOD for further guidance.  If neither are available contact a member of the leadership team.

## 2018-10-10 NOTE — Telephone Encounter (Signed)
Patient called in and states he has been having diarrhea and stomach upset.  Said drinking soda helps settle his stomach some. I suggested trying Imodium for the diarrhea. Pleae advise. He wants an office visit with Dr. Hilarie Fredrickson, but nothing is available until August schedule comes out.

## 2018-10-10 NOTE — Telephone Encounter (Signed)
I would have him try pantoprazole 40 mg daily for 2 to 4 weeks and please get him a visit next available with advanced practitioner given lack of availability of my office visits for now

## 2018-10-10 NOTE — Telephone Encounter (Signed)
Attempted to call patient back, and it went to voice mail. Left message for patient to call back, or if he feels like he can't  swallow to go to the ER

## 2018-10-11 ENCOUNTER — Ambulatory Visit (INDEPENDENT_AMBULATORY_CARE_PROVIDER_SITE_OTHER): Payer: Medicare Other | Admitting: Adult Health

## 2018-10-11 ENCOUNTER — Encounter: Payer: Self-pay | Admitting: Physician Assistant

## 2018-10-11 ENCOUNTER — Encounter: Payer: Self-pay | Admitting: Adult Health

## 2018-10-11 ENCOUNTER — Other Ambulatory Visit: Payer: Self-pay

## 2018-10-11 ENCOUNTER — Ambulatory Visit (INDEPENDENT_AMBULATORY_CARE_PROVIDER_SITE_OTHER): Payer: Medicare Other | Admitting: Physician Assistant

## 2018-10-11 VITALS — BP 104/58 | Temp 98.6°F | Wt 120.0 lb

## 2018-10-11 VITALS — BP 108/62 | HR 58 | Ht 67.0 in | Wt 121.0 lb

## 2018-10-11 DIAGNOSIS — I4821 Permanent atrial fibrillation: Secondary | ICD-10-CM | POA: Diagnosis not present

## 2018-10-11 DIAGNOSIS — I272 Pulmonary hypertension, unspecified: Secondary | ICD-10-CM | POA: Diagnosis not present

## 2018-10-11 DIAGNOSIS — R197 Diarrhea, unspecified: Secondary | ICD-10-CM

## 2018-10-11 DIAGNOSIS — R1310 Dysphagia, unspecified: Secondary | ICD-10-CM

## 2018-10-11 DIAGNOSIS — I5032 Chronic diastolic (congestive) heart failure: Secondary | ICD-10-CM

## 2018-10-11 MED ORDER — PANTOPRAZOLE SODIUM 40 MG PO TBEC: 40.0000 mg | DELAYED_RELEASE_TABLET | Freq: Every day | ORAL | 0 refills | Status: DC

## 2018-10-11 NOTE — Patient Instructions (Signed)
Medication Instructions:  Your physician recommends that you continue on your current medications as directed. Please refer to the Current Medication list given to you today.  If you need a refill on your cardiac medications before your next appointment, please call your pharmacy.   Lab work: None ordered  If you have labs (blood work) drawn today and your tests are completely normal, you will receive your results only by: [name] MyChart Message (if you have MyChart) OR . A paper copy in the mail If you have any lab test that is abnormal or we need to change your treatment, we will call you to review the results.  Testing/Procedures: None ordered  Follow-Up: At [facility], you and your health needs are our priority.  As part of our continuing mission to provide you with exceptional heart care, we have created designated Provider Care Teams.  These Care Teams include your primary Cardiologist (physician) and Advanced Practice Providers (APPs -  Physician Assistants and Nurse Practitioners) who all work together to provide you with the care you need, when you need it. [name] KEEP YOUR SCHEDULED FOLLOW-UP APPOINTMENT  Any Other Special Instructions Will Be Listed Below (If Applicable).

## 2018-10-11 NOTE — Progress Notes (Signed)
Cardiology Office Note    Date:  10/11/2018   ID:  Jeremy Kelley, DOB [birth year], MRN [id number]  PCP:  [name], NP  Cardiologist: Dr. Acie Fredrickson  Chief Complaint: 2 weeks follow up  History of Present Illness:   Jeremy Kelley is a 85 y.o. male with a past medical history significant for CHF, atrial fibrillation, hyperlipidemia, Raynaud's, carotid artery disease (40-50% bilaterally) and aortic insufficiency seen for follow up.   He has had several years of complaints of progressive fatigue with walking. R&L heart cath in 07/2015 showed no significant CAD and normal right heart filling pressures. He did have moderate aortic insufficiency. Last echo 12/08/2015 showed normal LV systolic function with EF 55-60% and moderate aortic regurgitation essentially unchanged from prior.    He was last seen in the office by Dr. Acie Fredrickson on 06/08/2018 at which time he was having generalized weakness. It was noted that due to his overall frailty he is not a candidate for aortic valve replacement. He was noted to have multiple cardiac reasons which all likely contribute to his fatigue along with his advanced age.   On 09/06/2018 the patient called the office to report that his PCP had checked a ProBNP which was elevated. He noted that his breathing and symptoms had been worse over the past few weeks.  Dr. Acie Fredrickson advised to start torsemide 20 mg BID as lasix had not been providing good diuresis. KDur was also increased.   Seen by APP in the office on 09/12/2018. Breathing was normal but he was feeling fatigued.  He was told to take torsemide by PCP. My be dehydration.   Again see in clinic 09/26/2018. Reduced torsemide.   Here today for follow up. For the past 3 days, he had with upset stomach and diarrhea (black>>> yellow)>> improving. He is also dealing with difficulty swallowing for the past few weeks.  Pending GI evaluation.  Monday night patient had episode of "collapse".  Patient suddenly felt  elevated heart rate with diarrhea episode.  Afterwards he felt weak and fall asleep for approximately 2 hours.  He denies "loss of consciousness".  He had black diarrhea with "seed" which became yellow since yesterday.  No fever, chills or code exposure.  Past Medical History:  Diagnosis Date  . Arthritis   . Carotid artery stenosis 11/2008   bilateral 40-59% stenosis  . Colon polyps   . Diverticulosis   . Hepatitis    doesn't know which type 1969  . Hyperlipidemia   . Hypertension   . Numbness    fingertips  . Phimosis   . Prostate cancer ([place]) 2001  . Urothelial carcinoma of left distal ureter [facility])     Past Surgical History:  Procedure Laterality Date  . BIOPSY  10/01/2011   Procedure: BIOPSY;  Surgeon: [name], MD;  [facility];  Service: Urology;;  biopsy of bladder tumor  . BLADDER SUSPENSION  08/18/10   [name]  . CARDIAC CATHETERIZATION N/A 07/29/2015   Procedure: Right/Left Heart Cath and Coronary Angiography;  Surgeon: Peter M Martinique, MD;  Location: [place] [facility] LAB;  Service: Cardiovascular;  Laterality: N/A;  . CATARACT EXTRACTION W/ INTRAOCULAR LENS  IMPLANT, BILATERAL Bilateral   . CYSTOSCOPY W/ RETROGRADES  01/27/2012   Procedure: CYSTOSCOPY WITH RETROGRADE PYELOGRAM;  Surgeon: [name], MD;  [facility];  Service: Urology;  Laterality: Left;  . CYSTOSCOPY WITH RETROGRADE PYELOGRAM, URETEROSCOPY AND STENT PLACEMENT Left 11/24/2012   Procedure: CYSTOSCOPY WITH left RETROGRADE PYELOGRAM,  bladder washings, ureteroscopy;  Surgeon: [name], MD;  [facility];  Service: Urology;  Laterality: Left;  . CYSTOSCOPY WITH RETROGRADE PYELOGRAM, URETEROSCOPY AND STENT PLACEMENT Bilateral 11/19/2013   Procedure: CYSTOSCOPY WITH BILATERAL RETROGRADE PYELOGRAM,;  Surgeon: [name], MD;  [facility];  Service: Urology;  Laterality: Bilateral;  . EYE SURGERY  09/24/10   left eye. "Retina surgery"  . INGUINAL HERNIA REPAIR  1960  . PROSTATE SURGERY      s/p laparoscopic prostate surgery  . SKIN BIOPSY  05/20/2017   Superficial and nodular basal cell carcinoma (left temple)    basal cell carcinoma with sclerosis (right temple)  . SKIN CANCER EXCISION     a/p skin cancer right foot-non melanoma  . skin laceration    . TEE WITHOUT CARDIOVERSION N/A 07/29/2015   Procedure: TRANSESOPHAGEAL ECHOCARDIOGRAM (TEE);  Surgeon: Pixie Casino, MD;  Location: [facility] ENDOSCOPY;  Service: Cardiovascular;  Laterality: N/A;  cath to follow  . TRANSURETHRAL RESECTION OF BLADDER TUMOR  10/01/2011   Procedure: TRANSURETHRAL RESECTION OF BLADDER TUMOR (TURBT);  Surgeon: [name], MD;  [facility];  Service: Urology;  Laterality: N/A;  . TRANSURETHRAL RESECTION OF BLADDER TUMOR N/A 11/19/2013   Procedure: TRANSURETHRAL RESECTION OF BLADDER TUMOR (TURBT);  Surgeon: [name], MD;  [facility];  Service: Urology;  Laterality: N/A;  . URETEROSCOPY  01/27/2012   Procedure: URETEROSCOPY;  Surgeon: [name], MD;  [facility];  Service: Urology;  Laterality: Left;  CYSTO, Left RETROGRADE PYELOGRPHY, LEFT URETEROSCOPY   . URINARY SPHINCTER IMPLANT    . VARICOSE VEIN SURGERY      Current Medications: Prior to Admission medications   Medication Sig Start Date End Date Taking? Authorizing Provider  apixaban (ELIQUIS) 2.5 MG TABS tablet Take 1 tablet (2.5 mg total) by mouth 2 (two) times daily. 06/02/18   Nafziger, [name], NP  B Complex-C (B-COMPLEX WITH VITAMIN C) tablet Take 1 tablet by mouth daily. Reported on 07/24/2015 08/18/16   [name], NP  cholecalciferol (VITAMIN D) 1000 units tablet Take 1 tablet (1,000 Units total) by mouth daily. 08/18/16   Nafziger, [name], NP  KLOR-CON M10 10 MEQ tablet Take 1 tablet (10 mEq total) by mouth 2 (two) times daily. 09/07/18   Nahser, [name], MD  methylcellulose (ARTIFICIAL TEARS) 1 % ophthalmic solution Place 1 drop into both eyes 2 (two) times daily.     [provider]  metoprolol succinate (TOPROL-XL)  25 MG 24 hr tablet Take 1 tablet (25 mg total) by mouth at bedtime. 06/02/18   Nafziger, [name], NP  pravastatin (PRAVACHOL) 20 MG tablet Take 1 tablet (20 mg total) by mouth at bedtime. 06/02/18   Nafziger, [name], NP  torsemide (DEMADEX) 20 MG tablet Take 1 tablet (20 mg total) by mouth 2 (two) times daily. 09/07/18   Nahser, [name], MD    Allergies:   Patient has no known allergies.   Social History   Socioeconomic History  . Marital status: Single    Spouse name: Not on file  . Number of children: 0  . Years of education: Not on file  . Highest education level: Not on file  Occupational History  . Occupation: Retired  Scientific laboratory technician  . Financial resource strain: Not on file  . Food insecurity    Worry: Not on file    Inability: Not on file  . Transportation needs    Medical: Not on file    Non-medical: Not on file  Tobacco  Use  . Smoking status: Former Smoker  . Smokeless tobacco: Never Used  . Tobacco comment: 07/29/2015 "might smoke a cigar once/year"  Substance and Sexual Activity  . Alcohol use: Yes    Alcohol/week: 14.0 standard drinks    Types: 14 Glasses of wine per week    Comment: 07/29/2015 "big glass of wine q night"  . Drug use: No  . Sexual activity: Not on file  Lifestyle  . Physical activity    Days per week: Not on file    Minutes per session: Not on file  . Stress: Not on file  Relationships  . Social Herbalist on phone: Not on file    Gets together: Not on file    Attends religious service: Not on file    Active member of club or organization: Not on file    Attends meetings of clubs or organizations: Not on file    Relationship status: Not on file  Other Topics Concern  . Not on file  Social History Narrative   Patient is single, has never been married.  Does not have any children.  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   He drinks approximately 3-4 glasses of wine per week.  No history of excessive alcohol use.   Occasionally smokes a cigar.   Spends winters in [place]          Family History:  The patient's family history is not on file.   ROS:   Please see the history of present illness.    ROS All other systems reviewed and are negative.   PHYSICAL EXAM:   VS:  BP 108/62   Pulse (!) 58   Ht 5\' 7"  (1.702 m)   Wt 121 lb (54.9 kg)   SpO2 96%   BMI 18.95 kg/m    GEN: Frail elderly male in no acute distress  HEENT: normal  Neck: no JVD, carotid bruits, or masses Cardiac: Irregularly irregular, 4 out of 6 diastolic murmurs, rubs, or gallops,no edema  Respiratory:  clear to auscultation bilaterally, normal work of breathing GI: soft, nontender, nondistended, + BS MS: no deformity or atrophy  Skin: warm and dry, no rash Neuro:  Alert and Oriented x 3, Strength and sensation are intact Psych: euthymic mood, full affect  Wt Readings from Last 3 Encounters:  10/11/18 121 lb (54.9 kg)  10/04/18 122 lb (55.3 kg)  09/26/18 118 lb (53.5 kg)      Studies/Labs Reviewed:   EKG:  EKG is ordered today.  The ekg ordered today demonstrates atrial fibrillation at rate of 58 bpm  Recent Labs: 08/31/2018: ALT 14; Hemoglobin 13.8; Platelets 146.0; Pro B Natriuretic peptide (BNP) 459.0; TSH 0.94 09/26/2018: BUN 26; Creatinine, Ser 1.22; Potassium 4.0; Sodium 146   Lipid Panel    Component Value Date/Time   CHOL 135 09/14/2013 0852   TRIG 62.0 09/14/2013 0852   HDL 47.00 09/14/2013 0852   CHOLHDL 3 09/14/2013 0852   VLDL 12.4 09/14/2013 0852   LDLCALC 76 09/14/2013 0852    Additional studies/ records that were reviewed today include:   Echocardiogram: 04/2018 Study Conclusions  - Left ventricle: The cavity size was normal. There was mild   concentric hypertrophy. Systolic function was normal. The   estimated ejection fraction was in the range of 55% to 60%. Wall   motion was normal; there were no regional wall motion   abnormalities. The study is not technically sufficient to allow  evaluation of LV diastolic function. - Ventricular septum: The contour showed diastolic flattening and   systolic flattening consitent with right ventricular pressure and   volume overload. - Aortic valve: Valve mobility was restricted. There was moderate   to severe regurgitation. Regurgitation pressure half-time: 378   ms. - Mitral valve: Moderately to severely calcified annulus. Mildly   thickened leaflets . Mild prolapse, involving the posterior   leaflet. There was mild regurgitation. - Left atrium: The atrium was severely dilated. - Right ventricle: The cavity size was normal. Wall thickness was   normal. Systolic function was normal. - Right atrium: The atrium was massively dilated. - Tricuspid valve: There was moderate regurgitation. - Pulmonary arteries: Systolic pressure was severely increased. PA   peak pressure: 76 mm Hg (S).    ASSESSMENT & PLAN:    1. Persistent atrial fibrillation Elevated heart rate episode on Monday night likely exacerbated due to diarrhea/GI illness.  No recurrent episode.  Patient denies loss of consciousness.  His heart rate is 58 by EKG today.  Continue metoprolol succinate 25 mg daily and Eliquis 2.5 mg twice daily.  2.  Aortic insufficiency -Felt not a candidate for valve replacement.  3.  Chronic diastolic heart failure -Appears euvolemic.  No change in therapy.  4.  Diarrhea/difficulty swallowing -Patient has follow-up with PCP later today and upcoming GI appointment. -Given recent fatigue (symptoms seems multifactorial)  and tiredness and black diarrhea recommended lab work (CBC, BMET)  Follow-up with Dr. Acie Fredrickson as scheduled in 2 weeks.  Medication Adjustments/Labs and Tests Ordered: Current medicines are reviewed at length with the patient today.  Concerns regarding medicines are outlined above.  Medication changes, Labs and Tests ordered today are listed in the Patient Instructions below. There are no Patient Instructions on file  for this visit.   Jarrett Soho, [place]  10/11/2018 11:37 AM    [facility] [address], [place], [place]  [address] Phone: [phone]; Fax: [phone]

## 2018-10-11 NOTE — Progress Notes (Signed)
Subjective:    Patient ID: Jeremy Kelley, male    DOB: October 18, 1926, 85 y.o.   MRN: [id number]  HPI   85 year old male who  has a past medical history of Arthritis, Carotid artery stenosis (11/2008), Colon polyps, Diverticulosis, Hepatitis, Hyperlipidemia, Hypertension, Numbness, Phimosis, Prostate cancer ([place]) (2001), and Urothelial carcinoma of left distal ureter ([place]).   He is being seen today for an acute issue of upset stomach and diarrhea x 3 days. Report that when the diarrhea started it was " black" and has since changed to yellow/brown. Stool over the last 24 hours has become more formed. He denies taking imodium/pepto/iron supplement. He reports that he has been drinking 8 cans of soda a day.   In addition he is also having difficulty with swallowing, this has been a chronic issue with the patient. He feels as though he is unable to swallow food and has more phlegm in his mouth. He has an upcoming appointment with GI  He was seen earlier today by Cardiology for follow up regarding fatigue and for the feeling of elevated heart rate on Monday. He reports " collapsing" on Monday but did not have any LOC. His EKG today showed A fib with a rate of 58. It was felt as though his elevated heart rate was likely exacerbated due to diarrhea/GI issues. He has not had any recurrent episodes.   Review of Systems See HPI   Past Medical History:  Diagnosis Date  . Arthritis   . Carotid artery stenosis 11/2008   bilateral 40-59% stenosis  . Colon polyps   . Diverticulosis   . Hepatitis    doesn't know which type 1969  . Hyperlipidemia   . Hypertension   . Numbness    fingertips  . Phimosis   . Prostate cancer ([place]) 2001  . Urothelial carcinoma of left distal ureter [facility])     Social History   Socioeconomic History  . Marital status: Single    Spouse name: Not on file  . Number of children: 0  . Years of education: Not on file  . Highest education level: Not on file  Occupational  History  . Occupation: Retired  Scientific laboratory technician  . Financial resource strain: Not on file  . Food insecurity    Worry: Not on file    Inability: Not on file  . Transportation needs    Medical: Not on file    Non-medical: Not on file  Tobacco Use  . Smoking status: Former Research scientist (life sciences)  . Smokeless tobacco: Never Used  . Tobacco comment: 07/29/2015 "might smoke a cigar once/year"  Substance and Sexual Activity  . Alcohol use: Yes    Alcohol/week: 14.0 standard drinks    Types: 14 Glasses of wine per week    Comment: 07/29/2015 "big glass of wine q night"  . Drug use: No  . Sexual activity: Not on file  Lifestyle  . Physical activity    Days per week: Not on file    Minutes per session: Not on file  . Stress: Not on file  Relationships  . Social Herbalist on phone: Not on file    Gets together: Not on file    Attends religious service: Not on file    Active member of club or organization: Not on file    Attends meetings of clubs or organizations: Not on file    Relationship status: Not on file  . Intimate partner violence  Fear of current or ex partner: Not on file    Emotionally abused: Not on file    Physically abused: Not on file    Forced sexual activity: Not on file  Other Topics Concern  . Not on file  Social History Narrative   Patient is single, has never been married.  Does not have any children.  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   He drinks approximately 3-4 glasses of wine per week.  No history of excessive alcohol use.  Occasionally smokes a cigar.   Spends winters in [place]         Past Surgical History:  Procedure Laterality Date  . BIOPSY  10/01/2011   Procedure: BIOPSY;  Surgeon: [name], MD;  [facility];  Service: Urology;;  biopsy of bladder tumor  . BLADDER SUSPENSION  08/18/10   [name]  . CARDIAC CATHETERIZATION N/A 07/29/2015   Procedure: Right/Left Heart Cath and Coronary Angiography;  Surgeon: Peter M  Martinique, MD;  Location: [place] [facility];  Service: Cardiovascular;  Laterality: N/A;  . CATARACT EXTRACTION W/ INTRAOCULAR LENS  IMPLANT, BILATERAL Bilateral   . CYSTOSCOPY W/ RETROGRADES  01/27/2012   Procedure: CYSTOSCOPY WITH RETROGRADE PYELOGRAM;  Surgeon: [name], MD;  [facility];  Service: Urology;  Laterality: Left;  . CYSTOSCOPY WITH RETROGRADE PYELOGRAM, URETEROSCOPY AND STENT PLACEMENT Left 11/24/2012   Procedure: CYSTOSCOPY WITH left RETROGRADE PYELOGRAM, bladder washings, ureteroscopy;  Surgeon: [name], MD;  [facility];  Service: Urology;  Laterality: Left;  . CYSTOSCOPY WITH RETROGRADE PYELOGRAM, URETEROSCOPY AND STENT PLACEMENT Bilateral 11/19/2013   Procedure: CYSTOSCOPY WITH BILATERAL RETROGRADE PYELOGRAM,;  Surgeon: [name], MD;  [facility];  Service: Urology;  Laterality: Bilateral;  . EYE SURGERY  09/24/10   left eye. "Retina surgery"  . INGUINAL HERNIA REPAIR  1960  . PROSTATE SURGERY     s/p laparoscopic prostate surgery  . SKIN BIOPSY  05/20/2017   Superficial and nodular basal cell carcinoma (left temple)    basal cell carcinoma with sclerosis (right temple)  . SKIN CANCER EXCISION     a/p skin cancer right foot-non melanoma  . skin laceration    . TEE WITHOUT CARDIOVERSION N/A 07/29/2015   Procedure: TRANSESOPHAGEAL ECHOCARDIOGRAM (TEE);  Surgeon: Pixie Casino, MD;  [facility];  Service: Cardiovascular;  Laterality: N/A;  cath to follow  . TRANSURETHRAL RESECTION OF BLADDER TUMOR  10/01/2011   Procedure: TRANSURETHRAL RESECTION OF BLADDER TUMOR (TURBT);  Surgeon: [name], MD;  [facility];  Service: Urology;  Laterality: N/A;  . TRANSURETHRAL RESECTION OF BLADDER TUMOR N/A 11/19/2013   Procedure: TRANSURETHRAL RESECTION OF BLADDER TUMOR (TURBT);  Surgeon: [name], MD;  [facility];  Service: Urology;  Laterality: N/A;  . URETEROSCOPY  01/27/2012   Procedure: URETEROSCOPY;  Surgeon: [name], MD;  [facility];  Service: Urology;  Laterality: Left;  CYSTO, Left RETROGRADE PYELOGRPHY, LEFT URETEROSCOPY   . URINARY SPHINCTER IMPLANT    . VARICOSE VEIN SURGERY      Family History  Problem Relation Age of Onset  . Colon cancer Neg Hx   . Colon polyps Neg Hx     No Known Allergies  Current Outpatient Medications on File Prior to Visit  Medication Sig Dispense Refill  . apixaban (ELIQUIS) 2.5 MG TABS tablet Take 1 tablet (2.5 mg total) by mouth 2 (two) times daily. 180 tablet 0  . B Complex-C (B-COMPLEX WITH VITAMIN C)  tablet Take 1 tablet by mouth daily. Reported on 07/24/2015 90 tablet 3  . cholecalciferol (VITAMIN D) 1000 units tablet Take 1 tablet (1,000 Units total) by mouth daily. 90 tablet 3  . KLOR-CON M10 10 MEQ tablet Take 1 tablet (10 mEq total) by mouth 2 (two) times daily. 180 tablet 3  . methylcellulose (ARTIFICIAL TEARS) 1 % ophthalmic solution Place 1 drop into both eyes 2 (two) times daily.     . metoprolol succinate (TOPROL-XL) 25 MG 24 hr tablet Take 1 tablet (25 mg total) by mouth at bedtime. 90 tablet 0  . pravastatin (PRAVACHOL) 20 MG tablet Take 1 tablet (20 mg total) by mouth at bedtime. 90 tablet 0  . torsemide (DEMADEX) 20 MG tablet Take 1 tablet (20 mg total) by mouth 2 (two) times daily. 180 tablet 3   No current facility-administered medications on file prior to visit.     BP (!) 104/58   Temp 98.6 F (37 C)   Wt 120 lb (54.4 kg)   BMI 18.79 kg/m       Objective:   Physical Exam Vitals signs and nursing note reviewed.  Constitutional:      Appearance: Normal appearance.  Cardiovascular:     Rate and Rhythm: Rhythm irregularly irregular.     Pulses: Normal pulses.     Heart sounds: Normal heart sounds.  Pulmonary:     Effort: Pulmonary effort is normal.     Breath sounds: Normal breath sounds.  Abdominal:     General: Abdomen is flat. Bowel sounds are normal. There is no distension.     Palpations: Abdomen is soft.     Tenderness: There is no  abdominal tenderness.  Genitourinary:    Rectum: Guaiac result negative. External hemorrhoid present. No tenderness. Normal anal tone.  Musculoskeletal: Normal range of motion.  Skin:    General: Skin is warm and dry.  Neurological:     General: No focal deficit present.     Mental Status: He is alert and oriented to person, place, and time.  Psychiatric:        Mood and Affect: Mood normal.        Behavior: Behavior normal.        Thought Content: Thought content normal.        Judgment: Judgment normal.       Assessment & Plan:  1. Diarrhea, unspecified type - hemoccult negative.  - Will check lab work but seems to be resolving.  - Advised to cutback on sodas and supplement more water - CBC with Differential/Platelet - Basic Metabolic Panel   [name], NP

## 2018-10-12 LAB — BASIC METABOLIC PANEL
BUN: 23 mg/dL (ref 6–23)
CO2: 32 mEq/L (ref 19–32)
Calcium: 9.6 mg/dL (ref 8.4–10.5)
Chloride: 102 mEq/L (ref 96–112)
Creatinine, Ser: 1.34 mg/dL (ref 0.40–1.50)
GFR: 50.11 mL/min — ABNORMAL LOW (ref >60.00)
Glucose, Bld: 92 mg/dL (ref 70–99)
Potassium: 3.7 mEq/L (ref 3.5–5.1)
Sodium: 143 mEq/L (ref 135–145)

## 2018-10-12 LAB — CBC WITH DIFFERENTIAL/PLATELET
Basophils Absolute: 0 10*3/uL (ref 0.0–0.1)
Basophils Relative: 0.6 % (ref 0.0–3.0)
Eosinophils Absolute: 0.2 10*3/uL (ref 0.0–0.7)
Eosinophils Relative: 2.9 % (ref 0.0–5.0)
HCT: 41.5 % (ref 39.0–52.0)
Hemoglobin: 13.8 g/dL (ref 13.0–17.0)
Lymphocytes Relative: 20.7 % (ref 12.0–46.0)
Lymphs Abs: 1.6 10*3/uL (ref 0.7–4.0)
MCHC: 33.1 g/dL (ref 30.0–36.0)
MCV: 97.4 fl (ref 78.0–100.0)
Monocytes Absolute: 0.4 10*3/uL (ref 0.1–1.0)
Monocytes Relative: 5.9 % (ref 3.0–12.0)
Neutro Abs: 5.2 10*3/uL (ref 1.4–7.7)
Neutrophils Relative %: 69.9 % (ref 43.0–77.0)
Platelets: 162 10*3/uL (ref 150.0–400.0)
RBC: 4.26 Mil/uL (ref 4.22–5.81)
RDW: 13.5 % (ref 11.5–15.5)
WBC: 7.5 10*3/uL (ref 4.0–10.5)

## 2018-10-12 NOTE — Telephone Encounter (Signed)
Spoke to patient. He started on the Protonix today. Gave him instructions on taking. Scheduled in person office visit with Tye Savoy 11/06/18 at 2:00pm

## 2018-10-12 NOTE — Telephone Encounter (Signed)
Patient is returning your call.  

## 2018-10-20 ENCOUNTER — Other Ambulatory Visit: Payer: Self-pay | Admitting: Adult Health

## 2018-10-20 MED ORDER — DOXYCYCLINE HYCLATE 100 MG PO CAPS: 100.0000 mg | ORAL_CAPSULE | Freq: Two times a day (BID) | ORAL | 0 refills | Status: DC

## 2018-10-25 ENCOUNTER — Telehealth: Payer: Self-pay | Admitting: Cardiovascular Disease

## 2018-10-25 ENCOUNTER — Telehealth: Payer: Self-pay | Admitting: Family Medicine

## 2018-10-25 DIAGNOSIS — H5010 Unspecified exotropia: Secondary | ICD-10-CM | POA: Diagnosis not present

## 2018-10-25 DIAGNOSIS — H532 Diplopia: Secondary | ICD-10-CM | POA: Diagnosis not present

## 2018-10-25 DIAGNOSIS — H5021 Vertical strabismus, right eye: Secondary | ICD-10-CM | POA: Diagnosis not present

## 2018-10-25 NOTE — Telephone Encounter (Signed)
New Message ° ° ° °Left message to confirm appt and answer covid questions  °

## 2018-10-25 NOTE — Telephone Encounter (Signed)
DMV forms have been completed and mailed back to Jeremy Kelley attorney at law.  Nothing further needed.

## 2018-10-26 ENCOUNTER — Ambulatory Visit: Payer: Medicare Other | Admitting: Cardiovascular Disease

## 2018-10-27 ENCOUNTER — Encounter: Payer: Self-pay | Admitting: Cardiovascular Disease

## 2018-10-27 ENCOUNTER — Ambulatory Visit (INDEPENDENT_AMBULATORY_CARE_PROVIDER_SITE_OTHER): Payer: Medicare Other | Admitting: Cardiovascular Disease

## 2018-10-27 ENCOUNTER — Other Ambulatory Visit: Payer: Self-pay

## 2018-10-27 VITALS — BP 106/58 | HR 78 | Ht 67.0 in | Wt 116.0 lb

## 2018-10-27 DIAGNOSIS — N3946 Mixed incontinence: Secondary | ICD-10-CM | POA: Diagnosis not present

## 2018-10-27 DIAGNOSIS — I351 Nonrheumatic aortic (valve) insufficiency: Secondary | ICD-10-CM | POA: Diagnosis not present

## 2018-10-27 DIAGNOSIS — I5032 Chronic diastolic (congestive) heart failure: Secondary | ICD-10-CM

## 2018-10-27 DIAGNOSIS — I4821 Permanent atrial fibrillation: Secondary | ICD-10-CM

## 2018-10-27 DIAGNOSIS — I10 Essential (primary) hypertension: Secondary | ICD-10-CM | POA: Diagnosis not present

## 2018-10-27 DIAGNOSIS — Z8551 Personal history of malignant neoplasm of bladder: Secondary | ICD-10-CM | POA: Diagnosis not present

## 2018-10-27 DIAGNOSIS — Z8546 Personal history of malignant neoplasm of prostate: Secondary | ICD-10-CM | POA: Diagnosis not present

## 2018-10-27 NOTE — Patient Instructions (Addendum)
Medication Instructions:  Your physician recommends that you continue on your current medications as directed. Please refer to the Current Medication list given to you today.  Labwork: None ordered.  Testing/Procedures: None ordered.  Follow-Up:  Dr. Acie Fredrickson- October 12 at 11:20am  Any Other Special Instructions Will Be Listed Below (If Applicable).     If you need a refill on your cardiac medications before your next appointment, please call your pharmacy.
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
## 2018-11-01 ENCOUNTER — Other Ambulatory Visit: Payer: Self-pay | Admitting: Internal Medicine

## 2018-11-02 ENCOUNTER — Other Ambulatory Visit: Payer: Self-pay

## 2018-11-02 ENCOUNTER — Telehealth (INDEPENDENT_AMBULATORY_CARE_PROVIDER_SITE_OTHER): Payer: Medicare Other | Admitting: Adult Health

## 2018-11-02 DIAGNOSIS — F329 Major depressive disorder, single episode, unspecified: Secondary | ICD-10-CM | POA: Diagnosis not present

## 2018-11-02 MED ORDER — SERTRALINE HCL 25 MG PO TABS: 25.0000 mg | ORAL_TABLET | Freq: Every day | ORAL | 1 refills | Status: DC

## 2018-11-02 NOTE — Progress Notes (Signed)
Virtual Visit via Telephone Note  I connected with [name] on 11/02/18 at  4:00 PM EDT by telephone and verified that I am speaking with the correct person using two identifiers.   I discussed the limitations, risks, security and privacy concerns of performing an evaluation and management service by telephone and the availability of in person appointments. I also discussed with the patient that there may be a patient responsible charge related to this service. The patient expressed understanding and agreed to proceed.  Location patient: home Location provider: work or home office Participants present for the call: patient, provider Patient did not have a visit in the prior 7 days to address this/these issue(s).   History of Present Illness: 85 year old male who  has a past medical history of Arthritis, Carotid artery stenosis (11/2008), Colon polyps, Diverticulosis, Hepatitis, Hyperlipidemia, Hypertension, Numbness, Phimosis, Prostate cancer ([place]) (2001), and Urothelial carcinoma of left distal ureter ([place]).  Patient with long standing history of fatigue, DOE, and chronic back pain.  Luz Lex a lot to [place] where in the past he has spent about half a year at a time.  Since the COVID pandemic has started he has been unable to travel, has not been able to interact with his friends, go to the gym, and is pretty much stayed his home, except to exercise outside.  .  Reports that with all the compounding health issues in the state of the world he started to feel little depressed.  He does report history of depression "when I was a very young man".  He denies suicidal ideation   Observations/Objective: Patient sounds cheerful and well on the phone. I do not appreciate any SOB. Speech and thought processing are grossly intact. Patient reported vitals:  Assessment and Plan: 1. Reactive depression -We discussed treatment options.  Including medication and therapy.  He is interested in  trying medication to see if she will feel better overall.  I reviewed his most recent EKG showed no prolonged QT interval.  I am ok with starting on low dose Zoloft.  Follow up in one month     Follow Up Instructions:   I did not refer this patient for an OV in the next 24 hours for this/these issue(s).  I discussed the assessment and treatment plan with the patient. The patient was provided an opportunity to ask questions and all were answered. The patient agreed with the plan and demonstrated an understanding of the instructions.   The patient was advised to call back or seek an in-person evaluation if the symptoms worsen or if the condition fails to improve as anticipated.  I provided 15 minutes of non-face-to-face time during this encounter.   [name], NP

## 2018-11-03 ENCOUNTER — Telehealth: Payer: Self-pay

## 2018-11-03 ENCOUNTER — Telehealth: Payer: Self-pay | Admitting: General Surgery

## 2018-11-03 NOTE — Telephone Encounter (Signed)
I have already talked to [name] and told him that I think this would be a good idea to try

## 2018-11-03 NOTE — Telephone Encounter (Signed)
Covid-19 screening questions   Do you now or have you had a fever in the last 14 days? No  Do you have any respiratory symptoms of shortness of breath or cough now or in the last 14 days?no  Do you have any family members or close contacts with diagnosed or suspected Covid-19 in the past 14 days? No  Have you been tested for Covid-19 and found to be positive? No  The patient was informed to wear a mask to his appointment. He verbalized understanding.

## 2018-11-03 NOTE — Telephone Encounter (Signed)
Copied from [place] [id number]. Topic: Quick Communication - Rx Refill/Question >> Nov 03, 2018  2:36 PM Erick Blinks wrote: Roselyn Reef from [facility] called stating that pt is considering PTNS for bladder nerve stimulation. Pt wants PCP input and opinion before proceeding with treatment [phone] ex [id number] PTNS - low nerve stimulation for the bladder to reduce frequent urination.

## 2018-11-06 ENCOUNTER — Encounter: Payer: Self-pay | Admitting: Nurse Practitioner

## 2018-11-06 ENCOUNTER — Ambulatory Visit (INDEPENDENT_AMBULATORY_CARE_PROVIDER_SITE_OTHER): Payer: Medicare Other | Admitting: Nurse Practitioner

## 2018-11-06 VITALS — BP 100/60 | HR 66 | Temp 97.9°F | Ht 68.0 in | Wt 118.8 lb

## 2018-11-06 DIAGNOSIS — R1110 Vomiting, unspecified: Secondary | ICD-10-CM

## 2018-11-06 DIAGNOSIS — K5900 Constipation, unspecified: Secondary | ICD-10-CM | POA: Diagnosis not present

## 2018-11-06 NOTE — Patient Instructions (Addendum)
If you are age 83 or older, your body mass index should be between 23-30. Your Body mass index is 18.06 kg/m. If this is out of the aforementioned range listed, please consider follow up with your Primary Care Provider.  If you are age 1 or younger, your body mass index should be between 19-25. Your Body mass index is 18.06 kg/m. If this is out of the aformentioned range listed, please consider follow up with your Primary Care Provider.   Start Benefiber twice daily.  (this is over-the-counter)  Follow up with me on December 04, 2018 at1:30 pm  Thank you for choosing me and [facility].   Tye Savoy, NP

## 2018-11-06 NOTE — Progress Notes (Addendum)
Chief Complaint:    Diarrhea, regurgitation of phlegm  IMPRESSION and PLAN:    22. 85 yo male with longstanding laryngopharyngeal reflux with episodes of silent aspiration MBSS. He was working with SLP in [place], [place] feel the exercises helped but only did them for a month. Not utilizing compensatory mechanisms such as chin tuck for help prevent aspiration.  -sounds like he had a MBSS in [place]. He will get [place] a copy of the report but most likely SLP therapy is all we can offer.    2. Fecal soling. Suspect overflow. Has normal BM 3 x a week but in retrospect does feel constipation is possibility. Of note, CTAP w/ contrast in January 2020 showed large stool burden. He has noticed that soiling improved after using an enema.  -start Benefiber twice daily.  -offered him to call in a couple of weeks with condition update but he would like to see me in clinic instead.   3. Weight loss. Weight 118 pounds, down from 128 pounds mid March. Patient says he no longer dines out everyday given COVID pandemic and this has led to weight loss Weight loss still seems out of proportion.   Addendum: Reviewed and agree with assessment and management plan. Pyrtle, Lajuan Lines, MD    HPI:     Patient is an 85 yo male with Afib on Eliquis, CAD, aortic insufficiency, pulmonary HTN and hyperlipidemia. He was followed by Dr. Olevia Perches for laryngopharyngeal reflux with episodes of aspiration. Over the years SLP has given him xercises / maneuvers such  by chin tucks to help with silent aspiration.    I saw Mr. Osias in Aug 2019 for ongoing LPR symptoms with build up excessive phlegm / secretions in throat. Esophagram and MBSS showed moderate silent aspiration, patulous with mild esophageal stasis but no stricture. Plan was for referral to SLP but patient was leaving for [place] and wanted to resume care there.   Mr. Linarez has returned. He continues to have problems build of thick secretions.  He  describes build of "snot" in throat / nose, especially while eating. No dysphagia but chews with great care.  He did end up seeing SLP in [place], believes a MBSS was done but doesn't have report with him. He did go to speech therapy a couple of times a week for a month or so but didn't see any improvement and getting to their office was inconvenient.   Patient describes episodes of fecal soiling. He has normal formed stools a few times a week, no diarrhea. Has used enemas a few times and noticed soiling got better. He has no abdominal pain. No rectal bleeding.   He looks thin and weight is down significantly. Says weight loss is due to not eating out much these days.   Review of systems:     No chest pain, no SOB, no fevers, no urinary sx   Past Medical History:  Diagnosis Date  . Arthritis   . Carotid artery stenosis 11/2008   bilateral 40-59% stenosis  . Colon polyps   . Diverticulosis   . Hepatitis    doesn't know which type 1969  . Hyperlipidemia   . Hypertension   . Numbness    fingertips  . Phimosis   . Prostate cancer ([place]) 2001  . Urothelial carcinoma of left distal ureter ([facility])     Patient's surgical history, family medical history, social history, medications and allergies were all reviewed in Epic   Creatinine  clearance cannot be calculated (Patient's most recent lab result is older than the maximum 21 days allowed.)  Current Outpatient Medications  Medication Sig Dispense Refill  . apixaban (ELIQUIS) 2.5 MG TABS tablet Take 1 tablet (2.5 mg total) by mouth 2 (two) times daily. 180 tablet 0  . B Complex-C (B-COMPLEX WITH VITAMIN C) tablet Take 1 tablet by mouth daily. Reported on 07/24/2015 90 tablet 3  . cholecalciferol (VITAMIN D) 1000 units tablet Take 1 tablet (1,000 Units total) by mouth daily. 90 tablet 3  . KLOR-CON M10 10 MEQ tablet Take 1 tablet (10 mEq total) by mouth 2 (two) times daily. 180 tablet 3  . metoprolol succinate (TOPROL-XL) 25 MG 24 hr tablet  Take 1 tablet (25 mg total) by mouth at bedtime. 90 tablet 0  . pantoprazole (PROTONIX) 40 MG tablet TAKE 1 TABLET BY MOUTH EVERY DAY 28 tablet 0  . pravastatin (PRAVACHOL) 20 MG tablet Take 20 mg by mouth daily.    . sertraline (ZOLOFT) 25 MG tablet Take 1 tablet (25 mg total) by mouth at bedtime. 30 tablet 1  . torsemide (DEMADEX) 20 MG tablet Take 20 mg by mouth daily.    [name] XIIDRA 5 % SOLN Place 1 drop into both eyes 2 (two) times a day.     No current facility-administered medications for this visit.     Physical Exam:     BP 100/60   Pulse 66   Temp 97.9 F (36.6 C)   Ht 5\' 8"  (1.727 m)   Wt 118 lb 12.8 oz (53.9 kg)   BMI 18.06 kg/m   GENERAL:  Pleasant frail male in NAD PSYCH: : Cooperative, normal affect EENT:  conjunctiva pink, mucous membranes moist, neck supple without masses CARDIAC:  RRR, no peripheral edema  PULM: Normal respiratory effort, lungs CTA bilaterally, no wheezing ABDOMEN:  Nondistended, soft, nontender. No obvious masses, no hepatomegaly,  normal bowel sounds Musculoskeletal:  Normal muscle tone, normal strength NEURO: Alert and oriented x 3, no focal neurologic deficits  I spent 25 minutes of face-to-face time with the patient. Greater than 50% of the time was spent counseling and coordinating care. Questions answered  Tye Savoy , NP 11/06/2018, 2:46 PM

## 2018-11-07 ENCOUNTER — Telehealth: Payer: Self-pay | Admitting: Nurse Practitioner

## 2018-11-07 NOTE — Telephone Encounter (Signed)
Spoke with the patient. He wanted to know what he could take as an anti-diarrheal for the fecal seepage. He knows the Benefiber was advised.  Discussed the action of anti-diarrheal. Reviewed the note and plan from the visit. Encouraged him to try the Benefiber first and encouraged him to call me back in a few days to discuss how he is doing.  Patient agrees to this.

## 2018-11-07 NOTE — Telephone Encounter (Signed)
No anti-diarrheal for now. I think he has overflow diarrhea from constipation. Please continue Benefiber and follow up in clinic. Thanks

## 2018-11-07 NOTE — Telephone Encounter (Signed)
Patient would like a call back regarding his medication. Patient denied in telling me what it was about other than his medication and his visit with Rubbie Battiest yesterday.

## 2018-11-17 ENCOUNTER — Encounter: Payer: Self-pay | Admitting: Adult Health

## 2018-11-17 ENCOUNTER — Ambulatory Visit (INDEPENDENT_AMBULATORY_CARE_PROVIDER_SITE_OTHER): Payer: Medicare Other | Admitting: Adult Health

## 2018-11-17 ENCOUNTER — Other Ambulatory Visit: Payer: Self-pay

## 2018-11-17 VITALS — BP 100/60 | Temp 97.3°F

## 2018-11-17 DIAGNOSIS — R5383 Other fatigue: Secondary | ICD-10-CM

## 2018-11-17 DIAGNOSIS — R634 Abnormal weight loss: Secondary | ICD-10-CM

## 2018-11-17 DIAGNOSIS — F329 Major depressive disorder, single episode, unspecified: Secondary | ICD-10-CM | POA: Diagnosis not present

## 2018-11-17 DIAGNOSIS — K5901 Slow transit constipation: Secondary | ICD-10-CM

## 2018-11-17 NOTE — Progress Notes (Signed)
Subjective:    Patient ID: Jeremy Kelley, male    DOB: 27-Oct-1926, 85 y.o.   MRN: [id number]  HPI 85 year old male who  has a past medical history of Arthritis, Carotid artery stenosis (11/2008), Colon polyps, Diverticulosis, Hepatitis, Hyperlipidemia, Hypertension, Numbness, Phimosis, Prostate cancer ([place]) (2001), and Urothelial carcinoma of left distal ureter ([place]).  Presents to the office today for follow-up.  He was seen last week by gastroenterology for constipation and fecal soiling.  Suspected overflow.  He was advised to start Benefiber twice a day, he reports that he did and his constipation fecal soiling have improved.  He denies any abdominal pain or rectal bleeding.  He has had some weight loss over the last 6 months or so, he does endorse this to not eating out as much.  Pre-COVID he would eat out almost every meal currently he is mostly eating sandwiches while at home and snacking throughout the day.  He does drink Ensure meal replacements twice a day Wt Readings from Last 3 Encounters:  11/06/18 118 lb 12.8 oz (53.9 kg)  10/27/18 116 lb (52.6 kg)  10/11/18 120 lb (54.4 kg)    11/02/2018 we had a telephone encounter at which time he expressed concern for depression due to his compounding health issues as well as lack of interaction with friends due to [place] pandemic.  He was started on a low-dose of Zoloft.  Unfortunately he has not picked this prescription up yet.  Continues to feel slightly depressed due to the above-mentioned.  Review of Systems See HPI   Past Medical History:  Diagnosis Date  . Arthritis   . Carotid artery stenosis 11/2008   bilateral 40-59% stenosis  . Colon polyps   . Diverticulosis   . Hepatitis    doesn't know which type 1969  . Hyperlipidemia   . Hypertension   . Numbness    fingertips  . Phimosis   . Prostate cancer ([place]) 2001  . Urothelial carcinoma of left distal ureter [facility])     Social History   Socioeconomic History  . Marital  status: Single    Spouse name: Not on file  . Number of children: 0  . Years of education: Not on file  . Highest education level: Not on file  Occupational History  . Occupation: Retired  Scientific laboratory technician  . Financial resource strain: Not on file  . Food insecurity    Worry: Not on file    Inability: Not on file  . Transportation needs    Medical: Not on file    Non-medical: Not on file  Tobacco Use  . Smoking status: Never Smoker  . Smokeless tobacco: Never Used  . Tobacco comment: 07/29/2015 "might smoke a cigar once/year"  Substance and Sexual Activity  . Alcohol use: Yes    Alcohol/week: 14.0 standard drinks    Types: 14 Glasses of wine per week    Comment: 07/29/2015 "big glass of wine q night"  . Drug use: No  . Sexual activity: Not on file  Lifestyle  . Physical activity    Days per week: Not on file    Minutes per session: Not on file  . Stress: Not on file  Relationships  . Social Herbalist on phone: Not on file    Gets together: Not on file    Attends religious service: Not on file    Active member of club or organization: Not on file    Attends meetings  of clubs or organizations: Not on file    Relationship status: Not on file  . Intimate partner violence    Fear of current or ex partner: Not on file    Emotionally abused: Not on file    Physically abused: Not on file    Forced sexual activity: Not on file  Other Topics Concern  . Not on file  Social History Narrative   Patient is single, has never been married.  Does not have any children.  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   He drinks approximately 3-4 glasses of wine per week.  No history of excessive alcohol use.  Occasionally smokes a cigar.   Spends winters in [place]         Past Surgical History:  Procedure Laterality Date  . BIOPSY  10/01/2011   Procedure: BIOPSY;  Surgeon: [name], MD;  Location: [facility];  Service: Urology;;  biopsy of bladder tumor  .  BLADDER SUSPENSION  08/18/10   [name]  . CARDIAC CATHETERIZATION N/A 07/29/2015   Procedure: Right/Left Heart Cath and Coronary Angiography;  Surgeon: Peter M Martinique, MD;  Location: [facility];  Service: Cardiovascular;  Laterality: N/A;  . CATARACT EXTRACTION W/ INTRAOCULAR LENS  IMPLANT, BILATERAL Bilateral   . CYSTOSCOPY W/ RETROGRADES  01/27/2012   Procedure: CYSTOSCOPY WITH RETROGRADE PYELOGRAM;  Surgeon: [name], MD;  Location: [facility];  Service: Urology;  Laterality: Left;  . CYSTOSCOPY WITH RETROGRADE PYELOGRAM, URETEROSCOPY AND STENT PLACEMENT Left 11/24/2012   Procedure: CYSTOSCOPY WITH left RETROGRADE PYELOGRAM, bladder washings, ureteroscopy;  Surgeon: [name], MD;  Location: [facility];  Service: Urology;  Laterality: Left;  . CYSTOSCOPY WITH RETROGRADE PYELOGRAM, URETEROSCOPY AND STENT PLACEMENT Bilateral 11/19/2013   Procedure: CYSTOSCOPY WITH BILATERAL RETROGRADE PYELOGRAM,;  Surgeon: [name], MD;  Location: [facility];  Service: Urology;  Laterality: Bilateral;  . EYE SURGERY  09/24/10   left eye. "Retina surgery"  . INGUINAL HERNIA REPAIR  1960  . PROSTATE SURGERY     s/p laparoscopic prostate surgery  . SKIN BIOPSY  05/20/2017   Superficial and nodular basal cell carcinoma (left temple)    basal cell carcinoma with sclerosis (right temple)  . SKIN CANCER EXCISION     a/p skin cancer right foot-non melanoma  . skin laceration    . TEE WITHOUT CARDIOVERSION N/A 07/29/2015   Procedure: TRANSESOPHAGEAL ECHOCARDIOGRAM (TEE);  Surgeon: Pixie Casino, MD;  Location: [facility];  Service: Cardiovascular;  Laterality: N/A;  cath to follow  . TRANSURETHRAL RESECTION OF BLADDER TUMOR  10/01/2011   Procedure: TRANSURETHRAL RESECTION OF BLADDER TUMOR (TURBT);  Surgeon: [name], MD;  [facility]: [facility];  Service: Urology;  Laterality: N/A;  . TRANSURETHRAL RESECTION OF BLADDER TUMOR N/A 11/19/2013   Procedure: TRANSURETHRAL RESECTION OF BLADDER TUMOR (TURBT);  Surgeon:  [name], MD;  Location: [facility];  Service: Urology;  Laterality: N/A;  . URETEROSCOPY  01/27/2012   Procedure: URETEROSCOPY;  Surgeon: [name], MD;  Location[facility];  Service: Urology;  Laterality: Left;  CYSTO, Left RETROGRADE PYELOGRPHY, LEFT URETEROSCOPY   . URINARY SPHINCTER IMPLANT    . VARICOSE VEIN SURGERY      Family History  Problem Relation Age of Onset  . Colon cancer Neg Hx   . Colon polyps Neg Hx     No Known Allergies  Current Outpatient Medications on File Prior to Visit  Medication Sig Dispense Refill  . apixaban (ELIQUIS) 2.5 MG TABS tablet  Take 1 tablet (2.5 mg total) by mouth 2 (two) times daily. 180 tablet 0  . B Complex-C (B-COMPLEX WITH VITAMIN C) tablet Take 1 tablet by mouth daily. Reported on 07/24/2015 90 tablet 3  . cholecalciferol (VITAMIN D) 1000 units tablet Take 1 tablet (1,000 Units total) by mouth daily. 90 tablet 3  . KLOR-CON M10 10 MEQ tablet Take 1 tablet (10 mEq total) by mouth 2 (two) times daily. 180 tablet 3  . metoprolol succinate (TOPROL-XL) 25 MG 24 hr tablet Take 1 tablet (25 mg total) by mouth at bedtime. 90 tablet 0  . pantoprazole (PROTONIX) 40 MG tablet TAKE 1 TABLET BY MOUTH EVERY DAY 28 tablet 0  . pravastatin (PRAVACHOL) 20 MG tablet Take 20 mg by mouth daily.    . sertraline (ZOLOFT) 25 MG tablet Take 1 tablet (25 mg total) by mouth at bedtime. 30 tablet 1  . torsemide (DEMADEX) 20 MG tablet Take 20 mg by mouth daily.    [name] XIIDRA 5 % SOLN Place 1 drop into both eyes 2 (two) times a day.     No current facility-administered medications on file prior to visit.     BP 100/60   Temp (!) 97.3 F (36.3 C) (Temporal)       Objective:   Physical Exam Vitals signs and nursing note reviewed.  Constitutional:      Appearance: He is underweight.     Comments: Chronically ill appearing  Cardiovascular:     Rate and Rhythm: Normal rate. Rhythm irregularly irregular.     Pulses: Normal pulses.     Heart sounds: Normal heart  sounds.  Pulmonary:     Effort: Pulmonary effort is normal.     Breath sounds: Normal breath sounds.  Abdominal:     General: Abdomen is flat. Bowel sounds are normal. There is no distension.     Palpations: Abdomen is soft. There is no mass.     Tenderness: There is no abdominal tenderness. There is no right CVA tenderness, left CVA tenderness, guarding or rebound.     Hernia: No hernia is present.  Musculoskeletal:     Comments: Walking with a single prong cane.  Has steady gait  Skin:    General: Skin is warm and dry.     Capillary Refill: Capillary refill takes less than 2 seconds.  Neurological:     General: No focal deficit present.     Mental Status: He is alert and oriented to person, place, and time.  Psychiatric:        Mood and Affect: Mood normal.        Behavior: Behavior normal.        Thought Content: Thought content normal.        Judgment: Judgment normal.       Assessment & Plan:  1. Reactive depression -Advised to take Zoloft.  Follow-up in 1 month after starting  2. Slow transit constipation -Has resolved with Benefiber.  He was advised to continue with Benefiber  3. Fatigue, unspecified type -Type factorial due to chronic cardiac conditions, aging, and probably some degree due to depression.  4. Weight loss -Likely multifactorial due to diet and possibly depression as well.  We will have him start Zoloft and weight should increase   [name], NP

## 2018-11-28 ENCOUNTER — Other Ambulatory Visit: Payer: Self-pay | Admitting: Internal Medicine

## 2018-12-04 ENCOUNTER — Other Ambulatory Visit (HOSPITAL_COMMUNITY): Payer: Self-pay | Admitting: *Deleted

## 2018-12-04 ENCOUNTER — Encounter: Payer: Self-pay | Admitting: Nurse Practitioner

## 2018-12-04 ENCOUNTER — Ambulatory Visit (INDEPENDENT_AMBULATORY_CARE_PROVIDER_SITE_OTHER): Payer: Medicare Other | Admitting: Nurse Practitioner

## 2018-12-04 VITALS — BP 142/76 | HR 63 | Temp 98.0°F | Ht 68.0 in | Wt 121.0 lb

## 2018-12-04 DIAGNOSIS — R1310 Dysphagia, unspecified: Secondary | ICD-10-CM

## 2018-12-04 DIAGNOSIS — K5909 Other constipation: Secondary | ICD-10-CM | POA: Diagnosis not present

## 2018-12-04 DIAGNOSIS — K219 Gastro-esophageal reflux disease without esophagitis: Secondary | ICD-10-CM

## 2018-12-04 NOTE — Patient Instructions (Signed)
If you are age 83 or older, your body mass index should be between 23-30. Your Body mass index is 18.4 kg/m. If this is out of the aforementioned range listed, please consider follow up with your Primary Care Provider.  If you are age 43 or younger, your body mass index should be between 19-25. Your Body mass index is 18.4 kg/m. If this is out of the aformentioned range listed, please consider follow up with your Primary Care Provider.   You have been scheduled for a modified barium swallow on 12/08/18 at 1 pm. Please arrive 15 minutes prior to your test for registration. You will go to Radiology (1st Floor) for your appointment. Should you need to cancel or reschedule your appointment, please contact o [phone] [place]). _____________________________________________________________________ A Modified Barium Swallow Study, or MBS, is a special x-ray that is taken to check swallowing skills. It is carried out by a Stage manager and a Psychologist, clinical (SLP). During this test, yourmouth, throat, and esophagus, a muscular tube which connects your mouth to your stomach, is checked. The test will help you, your doctor, and the SLP plan what types of foods and liquids are easier for you to swallow. The SLP will also identify positions and ways to help you swallow more easily and safely. What will happen during an MBS? You will be taken to an x-ray room and seated comfortably. You will be asked to swallow small amounts of food and liquid mixed with barium. Barium is a liquid or paste that allows images of your mouth, throat and esophagus to be seen on x-ray. The x-ray captures moving images of the food you are swallowing as it travels from your mouth through your throat and into your esophagus. This test helps identify whether food or liquid is entering your lungs (aspiration). The test also shows which part of your mouth or throat lacks strength or coordination to move the food or liquid in  the right direction. This test typically takes 30 minutes to 1 hour to complete. ______________________________________________________________  We will call you with results.  Thank you for choosing me and [facility].   Tye Savoy, NP

## 2018-12-04 NOTE — Progress Notes (Signed)
Chief Complaint:   Follow up   IMPRESSION and PLAN:     1. Fecal leakage. Resolved. Started him on BID fiber for possible overflow diarrhea and leakage has stopped. Having normal BM every 3 days. Wants to reduce dose to once daily as twice daily is disruptive to his schedule.  -ok to decrease fiber to once daily. If fecal leakage recurs then resume fiber BID   2. Excessive secretions / throat clearing / coughing with meals. This is a major concern for him. It is embarrassing, difficult to dine out with others.  Longstanding history of LPR and also oropharyngeal dysphagia with silent aspiration of thin and nectar thick liquids.  Last MBSS was 4 years ago.  -Update MBSS and depending on findings refer to SLP for exercises / compensatory maneuvers. Patient is agreeable with this plan  3. Weight loss, stable. Weight up 3 pounds since I last saw him late July.   HPI:     Patient is an 85 yo male with PMH remarkable for Afib, on Eliquis, aortic insufficiency and chronic diastolic heart failure who I saw late July for complaints of excessive phlegm, fecal soiling, weight loss. Weight was down from 128 to 118 pounds, he attributed it to not dining out during [place]. Fecal soiling was felt to be secondary to overflow, I started him on BID fiber. The excessive phlegm was a chronic issue. Dr. Olevia Perches followed him years ago for LPR. MBSS 4 years ago show tracheal aspiration of thin and nectar thick liquids, often silent but improved with chin-tuck. Also had mid / distal esophageal dysmotility. I actually saw him August 2019 with same symptoms, offered referral to SLP but he was leaving for [place] and agreed to see SLP there. He saw a SLP several times over a month or so while in [place]. He hasn't been able to return to [place] due to [place].   Mr. Suiter is pleased with bowel movements. Having a BM about every 3 days and the fecal leakage has stopped. However, the BID dosing is disruptive to  his schedule and he would like to decrease dose to once daily.   He is still very much bothered by excessive phlegm like drainage from nose during meals. Often has to blow his nose during meal. He does cough and has to clear throat while eating. Food doesn't feel like it is getting stuck in his esophagus just the excessive secretions / throat clearing during meals is embarrassing. It takes him twice as long as others to complete a meal when dining out.  He brings in a tip sheet given to him years ago ( by Dr. Olevia Perches?). He tries to follow tips listed such as eating slowly, avoiding foods with mixture of solids and liquids, etc. We reviewed bullet points on the sheet. He has gained a few pounds   Review of systems:     No chest pain, no SOB, no fevers, no urinary sx   Past Medical History:  Diagnosis Date  . Arthritis   . Carotid artery stenosis 11/2008   bilateral 40-59% stenosis  . Colon polyps   . Diverticulosis   . Hepatitis    doesn't know which type 1969  . Hyperlipidemia   . Hypertension   . Numbness    fingertips  . Phimosis   . Prostate cancer ([place]) 2001  . Urothelial carcinoma of left distal ureter [facility])     Patient's surgical history, family medical history, social history, medications and  allergies were all reviewed in Epic    Current Outpatient Medications  Medication Sig Dispense Refill  . apixaban (ELIQUIS) 2.5 MG TABS tablet Take 1 tablet (2.5 mg total) by mouth 2 (two) times daily. 180 tablet 0  . B Complex-C (B-COMPLEX WITH VITAMIN C) tablet Take 1 tablet by mouth daily. Reported on 07/24/2015 90 tablet 3  . cholecalciferol (VITAMIN D) 1000 units tablet Take 1 tablet (1,000 Units total) by mouth daily. 90 tablet 3  . KLOR-CON M10 10 MEQ tablet Take 1 tablet (10 mEq total) by mouth 2 (two) times daily. 180 tablet 3  . metoprolol succinate (TOPROL-XL) 25 MG 24 hr tablet Take 1 tablet (25 mg total) by mouth at bedtime. 90 tablet 0  . pantoprazole (PROTONIX) 40 MG tablet  TAKE 1 TABLET BY MOUTH EVERY DAY 30 tablet 0  . polyethylene glycol (MIRALAX / GLYCOLAX) 17 g packet Take 17 g by mouth 2 (two) times daily.    . pravastatin (PRAVACHOL) 20 MG tablet Take 20 mg by mouth daily.    [name] torsemide (DEMADEX) 20 MG tablet Take 20 mg by mouth daily.    [name] XIIDRA 5 % SOLN Place 1 drop into both eyes 2 (two) times a day.    . sertraline (ZOLOFT) 25 MG tablet Take 1 tablet (25 mg total) by mouth at bedtime. 30 tablet 1   No current facility-administered medications for this visit.     Physical Exam:     BP (!) 142/76   Pulse 63   Temp 98 F (36.7 C) (Oral)   Ht 5\' 8"  (1.727 m)   Wt 121 lb (54.9 kg)   BMI 18.40 kg/m   GENERAL:  Pleasant frail male in NAD PSYCH: : Cooperative, normal affect EENT:  conjunctiva pink, mucous membranes moist, neck supple without masses CARDIAC:  RRR,  no peripheral edema PULM: Normal respiratory effort, lungs CTA bilaterally, no wheezing ABDOMEN:  Nondistended, soft, nontender. Normal bowel sounds NEURO: Alert and oriented x 3, no focal neurologic deficits  I spent 25 minutes of face-to-face time with the patient. Greater than 50% of the time was spent counseling and coordinating care. Questions answered  Tye Savoy , NP 12/04/2018, 1:26 PM

## 2018-12-07 NOTE — Progress Notes (Signed)
Addendum: Reviewed and agree with assessment and management plan. Hulen Mandler M, MD  

## 2018-12-08 ENCOUNTER — Other Ambulatory Visit: Payer: Self-pay

## 2018-12-08 ENCOUNTER — Ambulatory Visit (HOSPITAL_COMMUNITY)
Admission: RE | Admit: 2018-12-08 | Discharge: 2018-12-08 | Disposition: A | Discharge status: Home / Self Care | Payer: Medicare Other | Source: Ambulatory Visit | Attending: Nurse Practitioner | Admitting: Nurse Practitioner

## 2018-12-08 DIAGNOSIS — R1310 Dysphagia, unspecified: Secondary | ICD-10-CM

## 2018-12-08 DIAGNOSIS — K224 Dyskinesia of esophagus: Secondary | ICD-10-CM | POA: Diagnosis not present

## 2018-12-08 DIAGNOSIS — R633 Feeding difficulties: Secondary | ICD-10-CM | POA: Diagnosis not present

## 2018-12-08 DIAGNOSIS — K219 Gastro-esophageal reflux disease without esophagitis: Secondary | ICD-10-CM | POA: Insufficient documentation

## 2018-12-13 ENCOUNTER — Other Ambulatory Visit: Payer: Self-pay

## 2018-12-13 ENCOUNTER — Encounter: Payer: Self-pay | Admitting: Adult Health

## 2018-12-13 ENCOUNTER — Ambulatory Visit (INDEPENDENT_AMBULATORY_CARE_PROVIDER_SITE_OTHER): Payer: Medicare Other | Admitting: Adult Health

## 2018-12-13 VITALS — BP 130/68 | Temp 98.0°F

## 2018-12-13 DIAGNOSIS — J310 Chronic rhinitis: Secondary | ICD-10-CM | POA: Diagnosis not present

## 2018-12-13 DIAGNOSIS — R32 Unspecified urinary incontinence: Secondary | ICD-10-CM | POA: Diagnosis not present

## 2018-12-13 NOTE — Progress Notes (Signed)
Subjective:    Patient ID: Jeremy Kelley, male    DOB: [birth year], 85 y.o.   MRN: [id number]  HPI  85 year old male who is being evaluated today for follow-up after having barium swallow study done last week.  This test showed that he had mild oral and moderate pharyngeal dysphasia and continues with ongoing sensorimotor deficits.  Decreased lingual strength resulted in delay of oral transitioning with solids/pure more than liquids.  Decreased laryngeal ovation/closure allows laryngeal penetration/aspiration of thin.  Patient did not sense trace aspiration but did produce reflexive cough x2 with larger amounts of aspiration.  Cough did not clear aspirates however.  Since the patient was also complaining of problems with nasal drainage that he stated was not associated with p.o. intake, gastroenterology recommended ear nose and throat evaluation.  He would like this Probation officer to make referral to ENT for chronic rhinorrhea  Additionally, , he needs a refill of Myrbetriq   Review of Systems See HPI   Past Medical History:  Diagnosis Date  . Arthritis   . Carotid artery stenosis 11/2008   bilateral 40-59% stenosis  . Colon polyps   . Diverticulosis   . Hepatitis    doesn't know which type 1969  . Hyperlipidemia   . Hypertension   . Numbness    fingertips  . Phimosis   . Prostate cancer ([place]) 2001  . Urothelial carcinoma of left distal ureter [facility])     Social History   Socioeconomic History  . Marital status: Single    Spouse name: Not on file  . Number of children: 0  . Years of education: Not on file  . Highest education level: Not on file  Occupational History  . Occupation: Retired  Scientific laboratory technician  . Financial resource strain: Not on file  . Food insecurity    Worry: Not on file    Inability: Not on file  . Transportation needs    Medical: Not on file    Non-medical: Not on file  Tobacco Use  . Smoking status: Never Smoker  . Smokeless tobacco: Never Used  .  Tobacco comment: 07/29/2015 "might smoke a cigar once/year"  Substance and Sexual Activity  . Alcohol use: Yes    Alcohol/week: 14.0 standard drinks    Types: 14 Glasses of wine per week    Comment: 07/29/2015 "big glass of wine q night"  . Drug use: No  . Sexual activity: Not on file  Lifestyle  . Physical activity    Days per week: Not on file    Minutes per session: Not on file  . Stress: Not on file  Relationships  . Social Herbalist on phone: Not on file    Gets together: Not on file    Attends religious service: Not on file    Active member of club or organization: Not on file    Attends meetings of clubs or organizations: Not on file    Relationship status: Not on file  . Intimate partner violence    Fear of current or ex partner: Not on file    Emotionally abused: Not on file    Physically abused: Not on file    Forced sexual activity: Not on file  Other Topics Concern  . Not on file  Social History Narrative   Patient is single, has never been married.  Does not have any children.  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   He drinks approximately 3-4  glasses of wine per week.  No history of excessive alcohol use.  Occasionally smokes a cigar.   Spends winters in [place]         Past Surgical History:  Procedure Laterality Date  . BIOPSY  10/01/2011   Procedure: BIOPSY;  Surgeon: [name], MD;  [facility]: [facility];  Service: Urology;;  biopsy of bladder tumor  . BLADDER SUSPENSION  08/18/10   [name]  . CARDIAC CATHETERIZATION N/A 07/29/2015   Procedure: Right/Left Heart Cath and Coronary Angiography;  Surgeon: Peter M Martinique, MD;  Location: [facility];  Service: Cardiovascular;  Laterality: N/A;  . CATARACT EXTRACTION W/ INTRAOCULAR LENS  IMPLANT, BILATERAL Bilateral   . CYSTOSCOPY W/ RETROGRADES  01/27/2012   Procedure: CYSTOSCOPY WITH RETROGRADE PYELOGRAM;  Surgeon: [name], MD;  [facility];  Service: Urology;   Laterality: Left;  . CYSTOSCOPY WITH RETROGRADE PYELOGRAM, URETEROSCOPY AND STENT PLACEMENT Left 11/24/2012   Procedure: CYSTOSCOPY WITH left RETROGRADE PYELOGRAM, bladder washings, ureteroscopy;  Surgeon: [name], MD;  [facility];  Service: Urology;  Laterality: Left;  . CYSTOSCOPY WITH RETROGRADE PYELOGRAM, URETEROSCOPY AND STENT PLACEMENT Bilateral 11/19/2013   Procedure: CYSTOSCOPY WITH BILATERAL RETROGRADE PYELOGRAM,;  Surgeon: [name], MD;  [facility];  Service: Urology;  Laterality: Bilateral;  . EYE SURGERY  09/24/10   left eye. "Retina surgery"  . INGUINAL HERNIA REPAIR  1960  . PROSTATE SURGERY     s/p laparoscopic prostate surgery  . SKIN BIOPSY  05/20/2017   Superficial and nodular basal cell carcinoma (left temple)    basal cell carcinoma with sclerosis (right temple)  . SKIN CANCER EXCISION     a/p skin cancer right foot-non melanoma  . skin laceration    . TEE WITHOUT CARDIOVERSION N/A 07/29/2015   Procedure: TRANSESOPHAGEAL ECHOCARDIOGRAM (TEE);  Surgeon: Pixie Casino, MD;  Location[facility];  Service: Cardiovascular;  Laterality: N/A;  cath to follow  . TRANSURETHRAL RESECTION OF BLADDER TUMOR  10/01/2011   Procedure: TRANSURETHRAL RESECTION OF BLADDER TUMOR (TURBT);  Surgeon: [name], MD;  [facility];  Service: Urology;  Laterality: N/A;  . TRANSURETHRAL RESECTION OF BLADDER TUMOR N/A 11/19/2013   Procedure: TRANSURETHRAL RESECTION OF BLADDER TUMOR (TURBT);  Surgeon: [name], MD;  [facility];  Service: Urology;  Laterality: N/A;  . URETEROSCOPY  01/27/2012   Procedure: URETEROSCOPY;  Surgeon: [name], MD;  [facility];  Service: Urology;  Laterality: Left;  CYSTO, Left RETROGRADE PYELOGRPHY, LEFT URETEROSCOPY   . URINARY SPHINCTER IMPLANT    . VARICOSE VEIN SURGERY      Family History  Problem Relation Age of Onset  . Colon cancer Neg Hx   . Colon polyps Neg Hx     No Known Allergies  Current Outpatient  Medications on File Prior to Visit  Medication Sig Dispense Refill  . apixaban (ELIQUIS) 2.5 MG TABS tablet Take 1 tablet (2.5 mg total) by mouth 2 (two) times daily. 180 tablet 0  . B Complex-C (B-COMPLEX WITH VITAMIN C) tablet Take 1 tablet by mouth daily. Reported on 07/24/2015 90 tablet 3  . cholecalciferol (VITAMIN D) 1000 units tablet Take 1 tablet (1,000 Units total) by mouth daily. 90 tablet 3  . KLOR-CON M10 10 MEQ tablet Take 1 tablet (10 mEq total) by mouth 2 (two) times daily. 180 tablet 3  . metoprolol succinate (TOPROL-XL) 25 MG 24 hr tablet Take 1 tablet (25 mg total) by mouth at bedtime. 90 tablet 0  .  pantoprazole (PROTONIX) 40 MG tablet TAKE 1 TABLET BY MOUTH EVERY DAY 30 tablet 0  . polyethylene glycol (MIRALAX / GLYCOLAX) 17 g packet Take 17 g by mouth 2 (two) times daily.    . pravastatin (PRAVACHOL) 20 MG tablet Take 20 mg by mouth daily.    [name] torsemide (DEMADEX) 20 MG tablet Take 20 mg by mouth daily.    [name] XIIDRA 5 % SOLN Place 1 drop into both eyes 2 (two) times a day.    . sertraline (ZOLOFT) 25 MG tablet Take 1 tablet (25 mg total) by mouth at bedtime. 30 tablet 1   No current facility-administered medications on file prior to visit.     BP 130/68   Temp 98 F (36.7 C) (Temporal)       Objective:   Physical Exam Vitals signs and nursing note reviewed.  Constitutional:      Appearance: He is underweight.  HENT:     Nose: Rhinorrhea present. No congestion.     Mouth/Throat:     Mouth: Mucous membranes are moist.     Pharynx: Oropharynx is clear. No oropharyngeal exudate.  Cardiovascular:     Rate and Rhythm: Normal rate. Rhythm irregular.     Pulses: Normal pulses.     Heart sounds: Normal heart sounds.  Pulmonary:     Effort: Pulmonary effort is normal.     Breath sounds: Normal breath sounds.  Abdominal:     General: Abdomen is flat.     Palpations: Abdomen is soft.  Skin:    General: Skin is warm and dry.     Capillary Refill: Capillary refill  takes less than 2 seconds.  Neurological:     General: No focal deficit present.     Mental Status: He is alert and oriented to person, place, and time.  Psychiatric:        Mood and Affect: Mood normal.        Behavior: Behavior normal.        Thought Content: Thought content normal.        Judgment: Judgment normal.       Assessment & Plan:  1. Chronic rhinitis  - Ambulatory referral to ENT  2. Urinary incontinence, unspecified type  - mirabegron ER (MYRBETRIQ) 50 MG TB24 tablet; Take 1 tablet (50 mg total) by mouth daily.  Dispense: 90 tablet; Refill: 1  [name], NP

## 2018-12-14 MED ORDER — MIRABEGRON ER 50 MG PO TB24: 50.0000 mg | ORAL_TABLET | Freq: Every day | ORAL | 1 refills | Status: DC

## 2018-12-20 ENCOUNTER — Other Ambulatory Visit: Payer: Self-pay | Admitting: Adult Health

## 2018-12-20 DIAGNOSIS — I10 Essential (primary) hypertension: Secondary | ICD-10-CM

## 2018-12-20 DIAGNOSIS — Z760 Encounter for issue of repeat prescription: Secondary | ICD-10-CM

## 2018-12-20 DIAGNOSIS — I482 Chronic atrial fibrillation, unspecified: Secondary | ICD-10-CM

## 2018-12-22 ENCOUNTER — Other Ambulatory Visit: Payer: Self-pay

## 2018-12-22 ENCOUNTER — Ambulatory Visit (INDEPENDENT_AMBULATORY_CARE_PROVIDER_SITE_OTHER): Payer: Medicare Other | Admitting: Adult Health

## 2018-12-22 ENCOUNTER — Encounter: Payer: Self-pay | Admitting: Adult Health

## 2018-12-22 VITALS — BP 120/84 | Temp 97.9°F | Wt 122.0 lb

## 2018-12-22 DIAGNOSIS — N481 Balanitis: Secondary | ICD-10-CM

## 2018-12-22 MED ORDER — NYSTATIN 100000 UNIT/GM EX CREA: 1.0000 "application " | TOPICAL_CREAM | Freq: Two times a day (BID) | CUTANEOUS | 0 refills | Status: DC

## 2018-12-22 NOTE — Progress Notes (Signed)
Subjective:    Patient ID: Jeremy Kelley, male    DOB: 1926/06/14, 85 y.o.   MRN: [id number]  HPI  85 year old male who  has a past medical history of Arthritis, Carotid artery stenosis (11/2008), Colon polyps, Diverticulosis, Hepatitis, Hyperlipidemia, Hypertension, Numbness, Phimosis, Prostate cancer ([place]) (2001), and Urothelial carcinoma of left distal ureter ([place]).  He presents to the office today for an acute issue of rash on his penis.  Patient reports that over the last week he has developed a red itching/burning rash on his foreskin and round the head of the penis.  He has no difficulty retracting his foreskin.  He has not noticed any ulcerations  Review of Systems See HPI   Past Medical History:  Diagnosis Date   Arthritis    Carotid artery stenosis 11/2008   bilateral 40-59% stenosis   Colon polyps    Diverticulosis    Hepatitis    doesn't know which type 1969   Hyperlipidemia    Hypertension    Numbness    fingertips   Phimosis    Prostate cancer ([place]) 2001   Urothelial carcinoma of left distal ureter ([place])     Social History   Socioeconomic History   Marital status: Single    Spouse name: Not on file   Number of children: 0   Years of education: Not on file   Highest education level: Not on file  Occupational History   Occupation: Retired  Scientist, product/process development strain: Not on file   Food insecurity    Worry: Not on file    Inability: Not on file   Transportation needs    Medical: Not on file    Non-medical: Not on file  Tobacco Use   Smoking status: Never Smoker   Smokeless tobacco: Never Used   Tobacco comment: 07/29/2015 "might smoke a cigar once/year"  Substance and Sexual Activity   Alcohol use: Yes    Alcohol/week: 14.0 standard drinks    Types: 14 Glasses of wine per week    Comment: 07/29/2015 "big glass of wine q night"   Drug use: No   Sexual activity: Not on file  Lifestyle   Physical activity      Days per week: Not on file    Minutes per session: Not on file   Stress: Not on file  Relationships   Social connections    Talks on phone: Not on file    Gets together: Not on file    Attends religious service: Not on file    Active member of club or organization: Not on file    Attends meetings of clubs or organizations: Not on file    Relationship status: Not on file   Intimate partner violence    Fear of current or ex partner: Not on file    Emotionally abused: Not on file    Physically abused: Not on file    Forced sexual activity: Not on file  Other Topics Concern   Not on file  Social History Narrative   Patient is single, has never been married.  Does not have any children.  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   He drinks approximately 3-4 glasses of wine per week.  No history of excessive alcohol use.  Occasionally smokes a cigar.   Spends winters in [place]         Past Surgical History:  Procedure Laterality Date   BIOPSY  10/01/2011  Procedure: BIOPSY;  Surgeon: [name], MD;  [facility];  Service: Urology;;  biopsy of bladder tumor   BLADDER SUSPENSION  08/18/10   [name]   CARDIAC CATHETERIZATION N/A 07/29/2015   Procedure: Right/Left Heart Cath and Coronary Angiography;  Surgeon: Peter M Martinique, MD;  Location: [facility];  Service: Cardiovascular;  Laterality: N/A;   CATARACT EXTRACTION W/ INTRAOCULAR LENS  IMPLANT, BILATERAL Bilateral    CYSTOSCOPY W/ RETROGRADES  01/27/2012   Procedure: CYSTOSCOPY WITH RETROGRADE PYELOGRAM;  Surgeon: [name], MD;  [facility];  Service: Urology;  Laterality: Left;   CYSTOSCOPY WITH RETROGRADE PYELOGRAM, URETEROSCOPY AND STENT PLACEMENT Left 11/24/2012   Procedure: CYSTOSCOPY WITH left RETROGRADE PYELOGRAM, bladder washings, ureteroscopy;  Surgeon: [name], MD;  [facility];  Service: Urology;  Laterality: Left;   CYSTOSCOPY WITH RETROGRADE PYELOGRAM,  URETEROSCOPY AND STENT PLACEMENT Bilateral 11/19/2013   Procedure: CYSTOSCOPY WITH BILATERAL RETROGRADE PYELOGRAM,;  Surgeon: [name], MD;  [facility];  Service: Urology;  Laterality: Bilateral;   EYE SURGERY  09/24/10   left eye. "Retina surgery"   INGUINAL HERNIA REPAIR  1960   PROSTATE SURGERY     s/p laparoscopic prostate surgery   SKIN BIOPSY  05/20/2017   Superficial and nodular basal cell carcinoma (left temple)    basal cell carcinoma with sclerosis (right temple)   SKIN CANCER EXCISION     a/p skin cancer right foot-non melanoma   skin laceration     TEE WITHOUT CARDIOVERSION N/A 07/29/2015   Procedure: TRANSESOPHAGEAL ECHOCARDIOGRAM (TEE);  Surgeon: Pixie Casino, MD;  Location: [facility];  Service: Cardiovascular;  Laterality: N/A;  cath to follow   TRANSURETHRAL RESECTION OF BLADDER TUMOR  10/01/2011   Procedure: TRANSURETHRAL RESECTION OF BLADDER TUMOR (TURBT);  Surgeon: [name], MD;  [facility];  Service: Urology;  Laterality: N/A;   TRANSURETHRAL RESECTION OF BLADDER TUMOR N/A 11/19/2013   Procedure: TRANSURETHRAL RESECTION OF BLADDER TUMOR (TURBT);  Surgeon: [name], MD;  [facility];  Service: Urology;  Laterality: N/A;   URETEROSCOPY  01/27/2012   Procedure: URETEROSCOPY;  Surgeon: [name], MD;  [facility];  Service: Urology;  Laterality: Left;  CYSTO, Left RETROGRADE PYELOGRPHY, LEFT URETEROSCOPY    URINARY SPHINCTER IMPLANT     VARICOSE VEIN SURGERY      Family History  Problem Relation Age of Onset   Colon cancer Neg Hx    Colon polyps Neg Hx     No Known Allergies  Current Outpatient Medications on File Prior to Visit  Medication Sig Dispense Refill   apixaban (ELIQUIS) 2.5 MG TABS tablet Take 1 tablet (2.5 mg total) by mouth 2 (two) times daily. 180 tablet 0   B Complex-C (B-COMPLEX WITH VITAMIN C) tablet Take 1 tablet by mouth daily. Reported on 07/24/2015 90 tablet 3   cholecalciferol (VITAMIN D)  1000 units tablet Take 1 tablet (1,000 Units total) by mouth daily. 90 tablet 3   KLOR-CON M10 10 MEQ tablet Take 1 tablet (10 mEq total) by mouth 2 (two) times daily. 180 tablet 3   metoprolol succinate (TOPROL-XL) 25 MG 24 hr tablet TAKE 1 TABLET BY MOUTH EVERYDAY AT BEDTIME 90 tablet 1   mirabegron ER (MYRBETRIQ) 50 MG TB24 tablet Take 1 tablet (50 mg total) by mouth daily. 90 tablet 1   pantoprazole (PROTONIX) 40 MG tablet TAKE 1 TABLET BY MOUTH EVERY DAY 30 tablet 0   polyethylene glycol (MIRALAX / GLYCOLAX) 17 g  packet Take 17 g by mouth 2 (two) times daily.     pravastatin (PRAVACHOL) 20 MG tablet Take 20 mg by mouth daily.     torsemide (DEMADEX) 20 MG tablet Take 20 mg by mouth daily.     XIIDRA 5 % SOLN Place 1 drop into both eyes 2 (two) times a day.     sertraline (ZOLOFT) 25 MG tablet Take 1 tablet (25 mg total) by mouth at bedtime. 30 tablet 1   No current facility-administered medications on file prior to visit.     BP 120/84    Temp 97.9 F (36.6 C)    Wt 122 lb (55.3 kg)    BMI 18.55 kg/m       Objective:   Physical Exam Vitals signs and nursing note reviewed.  Constitutional:      Appearance: Normal appearance.  Cardiovascular:     Rate and Rhythm: Regular rhythm.     Pulses: Normal pulses.  Skin:    General: Skin is warm and dry.     Capillary Refill: Capillary refill takes less than 2 seconds.     Findings: Erythema present.     Comments: Pruritic erythematous rash noted on foreskin and glans of penis.  No discharge noted  Foreskin is easily retracted  Neurological:     Mental Status: He is alert.       Assessment & Plan:  1. Balanitis - nystatin cream (MYCOSTATIN); Apply 1 application topically 2 (two) times daily.  Dispense: 30 g; Refill: 0 - Keep area clean and dry  - Follow up if symptoms do not start to resolve in 2-3 days or sooner if symptoms worsen   [name], NP

## 2018-12-22 NOTE — Progress Notes (Signed)
   Subjective:    Patient ID: Jeremy Kelley, male    DOB: 12/21/1926, 85 y.o.   MRN: [id number]  HPI    Review of Systems     Objective:   Physical Exam        Assessment & Plan:

## 2018-12-28 DIAGNOSIS — J310 Chronic rhinitis: Secondary | ICD-10-CM | POA: Diagnosis not present

## 2019-01-02 ENCOUNTER — Other Ambulatory Visit: Payer: Self-pay | Admitting: Adult Health

## 2019-01-02 DIAGNOSIS — I482 Chronic atrial fibrillation, unspecified: Secondary | ICD-10-CM

## 2019-01-02 DIAGNOSIS — Z760 Encounter for issue of repeat prescription: Secondary | ICD-10-CM

## 2019-01-03 DIAGNOSIS — C44722 Squamous cell carcinoma of skin of right lower limb, including hip: Secondary | ICD-10-CM | POA: Diagnosis not present

## 2019-01-03 DIAGNOSIS — L578 Other skin changes due to chronic exposure to nonionizing radiation: Secondary | ICD-10-CM | POA: Diagnosis not present

## 2019-01-03 DIAGNOSIS — D485 Neoplasm of uncertain behavior of skin: Secondary | ICD-10-CM | POA: Diagnosis not present

## 2019-01-03 HISTORY — PX: SKIN BIOPSY: SHX1

## 2019-01-08 ENCOUNTER — Other Ambulatory Visit: Payer: Self-pay | Admitting: Adult Health

## 2019-01-08 DIAGNOSIS — F329 Major depressive disorder, single episode, unspecified: Secondary | ICD-10-CM

## 2019-01-09 ENCOUNTER — Other Ambulatory Visit: Payer: Self-pay | Admitting: Adult Health

## 2019-01-11 ENCOUNTER — Other Ambulatory Visit: Payer: Self-pay | Admitting: Adult Health

## 2019-01-11 ENCOUNTER — Ambulatory Visit: Payer: Medicare Other | Admitting: Adult Health

## 2019-01-12 ENCOUNTER — Other Ambulatory Visit: Payer: Self-pay

## 2019-01-12 ENCOUNTER — Telehealth (INDEPENDENT_AMBULATORY_CARE_PROVIDER_SITE_OTHER): Payer: Medicare Other | Admitting: Adult Health

## 2019-01-12 DIAGNOSIS — M5442 Lumbago with sciatica, left side: Secondary | ICD-10-CM

## 2019-01-12 DIAGNOSIS — S81811A Laceration without foreign body, right lower leg, initial encounter: Secondary | ICD-10-CM | POA: Diagnosis not present

## 2019-01-12 MED ORDER — METHYLPREDNISOLONE 4 MG PO TBPK: ORAL_TABLET | ORAL | 0 refills | Status: DC

## 2019-01-12 MED ORDER — DOXYCYCLINE HYCLATE 100 MG PO CAPS: 100.0000 mg | ORAL_CAPSULE | Freq: Two times a day (BID) | ORAL | 0 refills | Status: DC

## 2019-01-12 NOTE — Progress Notes (Signed)
Virtual Visit via Telephone Note  I connected with Jeremy Kelley on 01/12/19 at  2:30 PM EDT by telephone and verified that I am speaking with the correct person using two identifiers.   I discussed the limitations, risks, security and privacy concerns of performing an evaluation and management service by telephone and the availability of in person appointments. I also discussed with the patient that there may be a patient responsible charge related to this service. The patient expressed understanding and agreed to proceed.  Location patient: home Location provider: work or home office Participants present for the call: patient, provider Patient did not have a visit in the prior 7 days to address this/these issue(s).   History of Present Illness: 85 year old male who is being evaluated today for 2 separate acute issues.  Sustained a skin tear on his right leg last week has been trying to bandage the wound.  He reports that he has had some localized redness, and discolored drainage from the wound.  He is believes that the wound is infected.  Secondly, starting about 3 days ago he has had some left lower back pain with radiating pain down his left leg.  Pain is worse when sitting for long periods of time and with weightbearing.  He is unable to describe the pain but states that " it just hurts and I have lost some mobility."  Denies urinary incontinence past baseline or bowel incontinence.  He believes he aggravated his lower back when he was doing exercises at the beginning of the week.   Observations/Objective: Patient sounds cheerful and well on the phone. I do not appreciate any SOB. Speech and thought processing are grossly intact. Patient reported vitals:  Assessment and Plan: 1. Acute left-sided low back pain with left-sided sciatica - advised gentle stretching exercises and warm compress - methylPREDNISolone (MEDROL DOSEPAK) 4 MG TBPK tablet; Take as directed  Dispense: 21  tablet; Refill: 0 - Follow up PRN  2. Noninfected skin tear of right lower extremity, initial encounter - doxycycline (VIBRAMYCIN) 100 MG capsule; Take 1 capsule (100 mg total) by mouth 2 (two) times daily.  Dispense: 14 capsule; Refill: 0 - Follow up PRN   Follow Up Instructions:   I did not refer this patient for an OV in the next 24 hours for this/these issue(s).  I discussed the assessment and treatment plan with the patient. The patient was provided an opportunity to ask questions and all were answered. The patient agreed with the plan and demonstrated an understanding of the instructions.   The patient was advised to call back or seek an in-person evaluation if the symptoms worsen or if the condition fails to improve as anticipated.  I provided 18 minutes of non-face-to-face time during this encounter.   [name], NP

## 2019-01-18 ENCOUNTER — Encounter: Payer: Self-pay | Admitting: Family Medicine
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
## 2019-01-22 ENCOUNTER — Ambulatory Visit (INDEPENDENT_AMBULATORY_CARE_PROVIDER_SITE_OTHER): Payer: Medicare Other | Admitting: Cardiovascular Disease

## 2019-01-22 ENCOUNTER — Encounter: Payer: Self-pay | Admitting: Cardiovascular Disease

## 2019-01-22 ENCOUNTER — Other Ambulatory Visit: Payer: Self-pay

## 2019-01-22 VITALS — BP 118/56 | HR 62 | Ht 69.0 in | Wt 120.1 lb

## 2019-01-22 DIAGNOSIS — R5383 Other fatigue: Secondary | ICD-10-CM

## 2019-01-22 DIAGNOSIS — I482 Chronic atrial fibrillation, unspecified: Secondary | ICD-10-CM

## 2019-01-22 DIAGNOSIS — I10 Essential (primary) hypertension: Secondary | ICD-10-CM | POA: Diagnosis not present

## 2019-01-22 DIAGNOSIS — I351 Nonrheumatic aortic (valve) insufficiency: Secondary | ICD-10-CM | POA: Diagnosis not present

## 2019-01-22 LAB — BASIC METABOLIC PANEL
BUN/Creatinine Ratio: 24 (ref 10–24)
BUN: 32 mg/dL (ref 10–36)
CO2: 30 mmol/L — ABNORMAL HIGH (ref 20–29)
Calcium: 10.1 mg/dL (ref 8.6–10.2)
Chloride: 97 mmol/L (ref 96–106)
Creatinine, Ser: 1.36 mg/dL — ABNORMAL HIGH (ref 0.76–1.27)
GFR calc Af Amer: 53 mL/min/{1.73_m2} — ABNORMAL LOW (ref >59)
GFR calc non Af Amer: 45 mL/min/{1.73_m2} — ABNORMAL LOW (ref >59)
Glucose: 97 mg/dL (ref 65–99)
Potassium: 4.7 mmol/L (ref 3.5–5.2)
Sodium: 141 mmol/L (ref 134–144)

## 2019-01-22 LAB — CBC
Hematocrit: 40.2 % (ref 37.5–51.0)
Hemoglobin: 13.7 g/dL (ref 13.0–17.7)
MCH: 32.5 pg (ref 26.6–33.0)
MCHC: 34.1 g/dL (ref 31.5–35.7)
MCV: 95 fL (ref 79–97)
Platelets: 241 10*3/uL (ref 150–450)
RBC: 4.22 x10E6/uL (ref 4.14–5.80)
RDW: 12.1 % (ref 11.6–15.4)
WBC: 8.8 10*3/uL (ref 3.4–10.8)

## 2019-01-22 LAB — TSH: TSH: 0.737 u[IU]/mL (ref 0.450–4.500)

## 2019-01-22 NOTE — Patient Instructions (Signed)
Medication Instructions:  Your physician recommends that you continue on your current medications as directed. Please refer to the Current Medication list given to you today.  If you need a refill on your cardiac medications before your next appointment, please call your pharmacy.   Lab work: TODAY - CBC, TSH, BMET If you have labs (blood work) drawn today and your tests are completely normal, you will receive your results only by: [name] MyChart Message (if you have MyChart) OR . A paper copy in the mail If you have any lab test that is abnormal or we need to change your treatment, we will call you to review the results.  Testing/Procedures: None Ordered   Follow-Up: At [facility], you and your health needs are our priority.  As part of our continuing mission to provide you with exceptional heart care, we have created designated Provider Care Teams.  These Care Teams include your primary Cardiologist (physician) and Advanced Practice Providers (APPs -  Physician Assistants and Nurse Practitioners) who all work together to provide you with the care you need, when you need it. You will need a follow up appointment in:  3 months.  You will receive a phone call to schedule your appointment. You may see Mertie Moores, MD or one of the following Advanced Practice Providers on your designated Care Team: Richardson Dopp, PA-C [place], [place] . Daune Perch, NP

## 2019-01-23 ENCOUNTER — Other Ambulatory Visit: Payer: Self-pay | Admitting: Adult Health

## 2019-01-24 ENCOUNTER — Encounter: Payer: Self-pay | Admitting: Neurology

## 2019-01-24 ENCOUNTER — Ambulatory Visit (INDEPENDENT_AMBULATORY_CARE_PROVIDER_SITE_OTHER): Payer: Medicare Other | Admitting: Adult Health

## 2019-01-24 ENCOUNTER — Encounter: Payer: Self-pay | Admitting: Adult Health

## 2019-01-24 ENCOUNTER — Other Ambulatory Visit: Payer: Self-pay

## 2019-01-24 VITALS — BP 118/62 | Temp 97.7°F | Wt 120.0 lb

## 2019-01-24 DIAGNOSIS — C44712 Basal cell carcinoma of skin of right lower limb, including hip: Secondary | ICD-10-CM

## 2019-01-24 DIAGNOSIS — R4189 Other symptoms and signs involving cognitive functions and awareness: Secondary | ICD-10-CM | POA: Diagnosis not present

## 2019-01-24 NOTE — Progress Notes (Signed)
Subjective:    Patient ID: Jeremy Kelley, male    DOB: [birth year], 85 y.o.   MRN: [id number]  HPI 85 year old male who  has a past medical history of Arthritis, Carotid artery stenosis (11/2008), Colon polyps, Diverticulosis, Hepatitis, Hyperlipidemia, Hypertension, Numbness, Phimosis, Prostate cancer ([place]) (2001), and Urothelial carcinoma of left distal ureter ([place]).  He presents to the office today for follow-up regarding multiple issues.  Most recently he has been seen by dermatology and has a large skin cancer on his right leg.  He has been scheduled for surgical removal at the skin surgery Center in November.  He would like me to see if I can get him a sooner appointment.  Additionally, he is worried about cognitive impairment.  He has been seen for this multiple times in the past.  His biggest complaint is that he will often forget what he is talking about in the middle of a sentence or forget somebody's name.  Recently he started noticing that he is dialing the wrong phone numbers on his cell phone.  He is not getting lost while driving.  He does stay active and reads a lot at home.  His most recent MRI from February 2020 showed   IMPRESSION: 1. No acute intracranial abnormality. 2. Generalized cerebral atrophy and mild-to-moderate chronic small vessel ischemic disease.  He is adamant about seeing a neurologist to make sure nothing is wrong with him.   Review of Systems See HPI   Past Medical History:  Diagnosis Date  . Arthritis   . Carotid artery stenosis 11/2008   bilateral 40-59% stenosis  . Colon polyps   . Diverticulosis   . Hepatitis    doesn't know which type 1969  . Hyperlipidemia   . Hypertension   . Numbness    fingertips  . Phimosis   . Prostate cancer ([place]) 2001  . Urothelial carcinoma of left distal ureter [facility])     Social History   Socioeconomic History  . Marital status: Single    Spouse name: Not on file  . Number of children: 0  . Years of  education: Not on file  . Highest education level: Not on file  Occupational History  . Occupation: Retired  Scientific laboratory technician  . Financial resource strain: Not on file  . Food insecurity    Worry: Not on file    Inability: Not on file  . Transportation needs    Medical: Not on file    Non-medical: Not on file  Tobacco Use  . Smoking status: Never Smoker  . Smokeless tobacco: Never Used  . Tobacco comment: 07/29/2015 "might smoke a cigar once/year"  Substance and Sexual Activity  . Alcohol use: Yes    Alcohol/week: 14.0 standard drinks    Types: 14 Glasses of wine per week    Comment: 07/29/2015 "big glass of wine q night"  . Drug use: No  . Sexual activity: Not on file  Lifestyle  . Physical activity    Days per week: Not on file    Minutes per session: Not on file  . Stress: Not on file  Relationships  . Social Herbalist on phone: Not on file    Gets together: Not on file    Attends religious service: Not on file    Active member of club or organization: Not on file    Attends meetings of clubs or organizations: Not on file    Relationship status: Not on file  .  Intimate partner violence    Fear of current or ex partner: Not on file    Emotionally abused: Not on file    Physically abused: Not on file    Forced sexual activity: Not on file  Other Topics Concern  . Not on file  Social History Narrative   Patient is single, has never been married.  Does not have any children.  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   He drinks approximately 3-4 glasses of wine per week.  No history of excessive alcohol use.  Occasionally smokes a cigar.   Spends winters in [place]         Past Surgical History:  Procedure Laterality Date  . BIOPSY  10/01/2011   Procedure: BIOPSY;  Surgeon: [name], MD;  [facility];  Service: Urology;;  biopsy of bladder tumor  . BLADDER SUSPENSION  08/18/10   [name]  . CARDIAC CATHETERIZATION N/A 07/29/2015    Procedure: Right/Left Heart Cath and Coronary Angiography;  Surgeon: Peter M Martinique, MD;  Location: [facility];  Service: Cardiovascular;  Laterality: N/A;  . CATARACT EXTRACTION W/ INTRAOCULAR LENS  IMPLANT, BILATERAL Bilateral   . CYSTOSCOPY W/ RETROGRADES  01/27/2012   Procedure: CYSTOSCOPY WITH RETROGRADE PYELOGRAM;  Surgeon: [name], MD;  [facility];  Service: Urology;  Laterality: Left;  . CYSTOSCOPY WITH RETROGRADE PYELOGRAM, URETEROSCOPY AND STENT PLACEMENT Left 11/24/2012   Procedure: CYSTOSCOPY WITH left RETROGRADE PYELOGRAM, bladder washings, ureteroscopy;  Surgeon: [name], MD;  [facility];  Service: Urology;  Laterality: Left;  . CYSTOSCOPY WITH RETROGRADE PYELOGRAM, URETEROSCOPY AND STENT PLACEMENT Bilateral 11/19/2013   Procedure: CYSTOSCOPY WITH BILATERAL RETROGRADE PYELOGRAM,;  Surgeon: [name], MD;  [facility];  Service: Urology;  Laterality: Bilateral;  . EYE SURGERY  09/24/10   left eye. "Retina surgery"  . INGUINAL HERNIA REPAIR  1960  . PROSTATE SURGERY     s/p laparoscopic prostate surgery  . SKIN BIOPSY  05/20/2017   Superficial and nodular basal cell carcinoma (left temple)    basal cell carcinoma with sclerosis (right temple)  . 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 SKIN CANCER EXCISION     a/p skin cancer right foot-non melanoma  . skin laceration    . TEE WITHOUT CARDIOVERSION N/A 07/29/2015   Procedure: TRANSESOPHAGEAL ECHOCARDIOGRAM (TEE);  Surgeon: Pixie Casino, MD;  Location: [facility];  Service: Cardiovascular;  Laterality: N/A;  cath to follow  . TRANSURETHRAL RESECTION OF BLADDER TUMOR  10/01/2011   Procedure: TRANSURETHRAL RESECTION OF BLADDER TUMOR (TURBT);  Surgeon: [name], MD;  [facility];  Service: Urology;  Laterality: N/A;  . TRANSURETHRAL RESECTION OF BLADDER TUMOR N/A 11/19/2013   Procedure: TRANSURETHRAL RESECTION OF BLADDER TUMOR (TURBT);   Surgeon: [name], MD;  [facility];  Service: Urology;  Laterality: N/A;  . URETEROSCOPY  01/27/2012   Procedure: URETEROSCOPY;  Surgeon: [name], MD;  [facility];  Service: Urology;  Laterality: Left;  CYSTO, Left RETROGRADE PYELOGRPHY, LEFT URETEROSCOPY   . URINARY SPHINCTER IMPLANT    . VARICOSE VEIN SURGERY      Family History  Problem Relation Age of Onset  . Colon cancer Neg Hx   . Colon polyps Neg Hx     No Known Allergies  Current Outpatient Medications on File Prior to Visit  Medication Sig Dispense Refill  . B Complex-C (B-COMPLEX WITH VITAMIN C)  tablet Take 1 tablet by mouth daily. Reported on 07/24/2015 90 tablet 3  . cholecalciferol (VITAMIN D) 1000 units tablet Take 1 tablet (1,000 Units total) by mouth daily. 90 tablet 3  . ELIQUIS 2.5 MG TABS tablet TAKE 1 TABLET BY MOUTH TWICE A DAY 180 tablet 1  . KLOR-CON M10 10 MEQ tablet Take 1 tablet (10 mEq total) by mouth 2 (two) times daily. 180 tablet 3  . methylPREDNISolone (MEDROL DOSEPAK) 4 MG TBPK tablet Take as directed 21 tablet 0  . metoprolol succinate (TOPROL-XL) 25 MG 24 hr tablet TAKE 1 TABLET BY MOUTH EVERYDAY AT BEDTIME 90 tablet 1  . mirabegron ER (MYRBETRIQ) 50 MG TB24 tablet Take 1 tablet (50 mg total) by mouth daily. 90 tablet 1  . nystatin cream (MYCOSTATIN) Apply 1 application topically 2 (two) times daily. 30 g 0  . polyethylene glycol (MIRALAX / GLYCOLAX) 17 g packet Take 17 g by mouth 2 (two) times daily.    . pravastatin (PRAVACHOL) 20 MG tablet TAKE 1 TABLET BY MOUTH EVERYDAY AT BEDTIME 90 tablet 1  . torsemide (DEMADEX) 20 MG tablet Take 20 mg by mouth daily.    [name] XIIDRA 5 % SOLN Place 1 drop into both eyes 2 (two) times a day.     No current facility-administered medications on file prior to visit.     BP 118/62 (BP Location: Left Arm, Patient Position: Sitting, Cuff Size: Normal)   Temp 97.7 F (36.5 C) (Temporal)   Wt 120 lb (54.4 kg)   BMI 17.72 kg/m       Objective:    Physical Exam Vitals signs reviewed.  Constitutional:      Appearance: Normal appearance.  Cardiovascular:     Rate and Rhythm: Normal rate. Rhythm irregularly irregular.     Pulses: Normal pulses.     Heart sounds: Murmur present.  Pulmonary:     Effort: Pulmonary effort is normal.     Breath sounds: Normal breath sounds.  Skin:    General: Skin is warm and dry.     Capillary Refill: Capillary refill takes less than 2 seconds.     Comments: Large basal cell carcinoma noted on medial aspect of right lower leg  Neurological:     General: No focal deficit present.     Mental Status: He is alert and oriented to person, place, and time.  Psychiatric:        Mood and Affect: Mood normal.        Behavior: Behavior normal.        Thought Content: Thought content normal.        Judgment: Judgment normal.       Assessment & Plan:  1. Cognitive impairment -Reassurance given to the patient today, what he is experiencing is likely normal age-related cognitive impairment.  We again reviewed his MRI from February.  He continues to be adamant about seeing a neurologist - Ambulatory referral to Neurology  2. Basal cell carcinoma (BCC) of skin of right lower extremity including hip  - Ambulatory referral to Dermatology   [name], NP

## 2019-01-29 DIAGNOSIS — R208 Other disturbances of skin sensation: Secondary | ICD-10-CM | POA: Diagnosis not present

## 2019-01-29 DIAGNOSIS — D485 Neoplasm of uncertain behavior of skin: Secondary | ICD-10-CM | POA: Diagnosis not present

## 2019-01-30 ENCOUNTER — Other Ambulatory Visit: Payer: Self-pay | Admitting: Adult Health

## 2019-01-30 DIAGNOSIS — C44722 Squamous cell carcinoma of skin of right lower limb, including hip: Secondary | ICD-10-CM | POA: Diagnosis not present

## 2019-01-30 DIAGNOSIS — R4189 Other symptoms and signs involving cognitive functions and awareness: Secondary | ICD-10-CM

## 2019-01-30 DIAGNOSIS — D485 Neoplasm of uncertain behavior of skin: Secondary | ICD-10-CM | POA: Diagnosis not present

## 2019-02-06 ENCOUNTER — Ambulatory Visit: Payer: Medicare Other | Admitting: Neurology

## 2019-02-21 ENCOUNTER — Other Ambulatory Visit: Payer: Self-pay

## 2019-02-21 ENCOUNTER — Inpatient Hospital Stay (HOSPITAL_COMMUNITY)
Admission: EM | Admit: 2019-02-21 | Discharge: 2019-02-24 | DRG: 389 | Disposition: A | Discharge status: Home / Self Care | Payer: Medicare Other | Attending: Family Medicine | Admitting: Family Medicine

## 2019-02-21 ENCOUNTER — Encounter (HOSPITAL_COMMUNITY): Payer: Self-pay

## 2019-02-21 ENCOUNTER — Emergency Department (HOSPITAL_COMMUNITY): Payer: Medicare Other

## 2019-02-21 DIAGNOSIS — I4891 Unspecified atrial fibrillation: Secondary | ICD-10-CM | POA: Diagnosis not present

## 2019-02-21 DIAGNOSIS — R1110 Vomiting, unspecified: Secondary | ICD-10-CM | POA: Diagnosis not present

## 2019-02-21 DIAGNOSIS — K3189 Other diseases of stomach and duodenum: Secondary | ICD-10-CM

## 2019-02-21 DIAGNOSIS — Z85828 Personal history of other malignant neoplasm of skin: Secondary | ICD-10-CM | POA: Diagnosis not present

## 2019-02-21 DIAGNOSIS — Z9079 Acquired absence of other genital organ(s): Secondary | ICD-10-CM | POA: Diagnosis not present

## 2019-02-21 DIAGNOSIS — Z681 Body mass index (BMI) 19 or less, adult: Secondary | ICD-10-CM | POA: Diagnosis not present

## 2019-02-21 DIAGNOSIS — R1084 Generalized abdominal pain: Secondary | ICD-10-CM | POA: Diagnosis not present

## 2019-02-21 DIAGNOSIS — Z8719 Personal history of other diseases of the digestive system: Secondary | ICD-10-CM

## 2019-02-21 DIAGNOSIS — I10 Essential (primary) hypertension: Secondary | ICD-10-CM | POA: Diagnosis present

## 2019-02-21 DIAGNOSIS — E785 Hyperlipidemia, unspecified: Secondary | ICD-10-CM | POA: Diagnosis present

## 2019-02-21 DIAGNOSIS — R64 Cachexia: Secondary | ICD-10-CM | POA: Diagnosis present

## 2019-02-21 DIAGNOSIS — R112 Nausea with vomiting, unspecified: Secondary | ICD-10-CM | POA: Diagnosis not present

## 2019-02-21 DIAGNOSIS — I272 Pulmonary hypertension, unspecified: Secondary | ICD-10-CM | POA: Diagnosis present

## 2019-02-21 DIAGNOSIS — R42 Dizziness and giddiness: Secondary | ICD-10-CM | POA: Diagnosis present

## 2019-02-21 DIAGNOSIS — I080 Rheumatic disorders of both mitral and aortic valves: Secondary | ICD-10-CM | POA: Diagnosis present

## 2019-02-21 DIAGNOSIS — I482 Chronic atrial fibrillation, unspecified: Secondary | ICD-10-CM | POA: Diagnosis present

## 2019-02-21 DIAGNOSIS — K5900 Constipation, unspecified: Secondary | ICD-10-CM | POA: Diagnosis present

## 2019-02-21 DIAGNOSIS — Z7901 Long term (current) use of anticoagulants: Secondary | ICD-10-CM

## 2019-02-21 DIAGNOSIS — K56600 Partial intestinal obstruction, unspecified as to cause: Principal | ICD-10-CM | POA: Diagnosis present

## 2019-02-21 DIAGNOSIS — K567 Ileus, unspecified: Secondary | ICD-10-CM | POA: Diagnosis not present

## 2019-02-21 DIAGNOSIS — Z03818 Encounter for observation for suspected exposure to other biological agents ruled out: Secondary | ICD-10-CM | POA: Diagnosis not present

## 2019-02-21 DIAGNOSIS — Z8551 Personal history of malignant neoplasm of bladder: Secondary | ICD-10-CM

## 2019-02-21 DIAGNOSIS — I2510 Atherosclerotic heart disease of native coronary artery without angina pectoris: Secondary | ICD-10-CM | POA: Diagnosis present

## 2019-02-21 DIAGNOSIS — E46 Unspecified protein-calorie malnutrition: Secondary | ICD-10-CM | POA: Diagnosis present

## 2019-02-21 DIAGNOSIS — Z79899 Other long term (current) drug therapy: Secondary | ICD-10-CM

## 2019-02-21 DIAGNOSIS — K56609 Unspecified intestinal obstruction, unspecified as to partial versus complete obstruction: Secondary | ICD-10-CM | POA: Diagnosis present

## 2019-02-21 DIAGNOSIS — Z8546 Personal history of malignant neoplasm of prostate: Secondary | ICD-10-CM | POA: Diagnosis not present

## 2019-02-21 DIAGNOSIS — R1310 Dysphagia, unspecified: Secondary | ICD-10-CM | POA: Diagnosis present

## 2019-02-21 DIAGNOSIS — Z20828 Contact with and (suspected) exposure to other viral communicable diseases: Secondary | ICD-10-CM | POA: Diagnosis present

## 2019-02-21 DIAGNOSIS — R140 Abdominal distension (gaseous): Secondary | ICD-10-CM | POA: Diagnosis not present

## 2019-02-21 DIAGNOSIS — Z66 Do not resuscitate: Secondary | ICD-10-CM | POA: Diagnosis present

## 2019-02-21 HISTORY — DX: Unspecified intestinal obstruction, unspecified as to partial versus complete obstruction: K56.609

## 2019-02-21 LAB — COMPREHENSIVE METABOLIC PANEL
ALT: 17 U/L (ref 0–44)
AST: 31 U/L (ref 15–41)
Albumin: 4 g/dL (ref 3.5–5.0)
Alkaline Phosphatase: 79 U/L (ref 38–126)
Anion gap: 9 (ref 5–15)
BUN: 23 mg/dL (ref 8–23)
CO2: 28 mmol/L (ref 22–32)
Calcium: 9.8 mg/dL (ref 8.9–10.3)
Chloride: 101 mmol/L (ref 98–111)
Creatinine, Ser: 1.13 mg/dL (ref 0.61–1.24)
GFR calc Af Amer: >60 mL/min (ref >60)
GFR calc non Af Amer: 57 mL/min — ABNORMAL LOW (ref >60)
Glucose, Bld: 138 mg/dL — ABNORMAL HIGH (ref 70–99)
Potassium: 4.2 mmol/L (ref 3.5–5.1)
Sodium: 138 mmol/L (ref 135–145)
Total Bilirubin: 1.6 mg/dL — ABNORMAL HIGH (ref 0.3–1.2)
Total Protein: 7 g/dL (ref 6.5–8.1)

## 2019-02-21 LAB — CBC
HCT: 44.6 % (ref 39.0–52.0)
Hemoglobin: 14.2 g/dL (ref 13.0–17.0)
MCH: 32.3 pg (ref 26.0–34.0)
MCHC: 31.8 g/dL (ref 30.0–36.0)
MCV: 101.4 fL — ABNORMAL HIGH (ref 80.0–100.0)
Platelets: 225 10*3/uL (ref 150–400)
RBC: 4.4 MIL/uL (ref 4.22–5.81)
RDW: 13.4 % (ref 11.5–15.5)
WBC: 15.8 10*3/uL — ABNORMAL HIGH (ref 4.0–10.5)
nRBC: 0 % (ref 0.0–0.2)

## 2019-02-21 LAB — URINALYSIS, ROUTINE W REFLEX MICROSCOPIC
Bilirubin Urine: NEGATIVE
Glucose, UA: NEGATIVE mg/dL
Hgb urine dipstick: NEGATIVE
Ketones, ur: NEGATIVE mg/dL
Leukocytes,Ua: NEGATIVE
Nitrite: NEGATIVE
Protein, ur: NEGATIVE mg/dL
Specific Gravity, Urine: 1.015 (ref 1.005–1.030)
pH: 7 (ref 5.0–8.0)

## 2019-02-21 LAB — LIPASE, BLOOD: Lipase: 26 U/L (ref 11–51)

## 2019-02-21 LAB — LACTIC ACID, PLASMA: Lactic Acid, Venous: 1.4 mmol/L (ref 0.5–1.9)

## 2019-02-21 LAB — GLUCOSE, CAPILLARY: Glucose-Capillary: 99 mg/dL (ref 70–99)

## 2019-02-21 LAB — SARS CORONAVIRUS 2 (TAT 6-24 HRS): SARS Coronavirus 2: NEGATIVE

## 2019-02-21 MED ORDER — SODIUM CHLORIDE (PF) 0.9 % IJ SOLN
INTRAMUSCULAR | Status: AC
  Filled 2019-02-21: qty 50

## 2019-02-21 MED ORDER — FENTANYL CITRATE (PF) 100 MCG/2ML IJ SOLN
25.0000 ug | Freq: Once | INTRAMUSCULAR | Status: AC
  Administered 2019-02-21: 25 ug via INTRAVENOUS
  Filled 2019-02-21: qty 2

## 2019-02-21 MED ORDER — ACETAMINOPHEN 650 MG RE SUPP: 650.0000 mg | Freq: Four times a day (QID) | RECTAL | Status: DC | PRN

## 2019-02-21 MED ORDER — ONDANSETRON HCL 4 MG/2ML IJ SOLN
4.0000 mg | Freq: Four times a day (QID) | INTRAMUSCULAR | Status: DC | PRN
  Administered 2019-02-21: 22:00:00 4 mg via INTRAVENOUS
  Filled 2019-02-21: qty 2

## 2019-02-21 MED ORDER — ACETAMINOPHEN 325 MG PO TABS
650.0000 mg | ORAL_TABLET | Freq: Four times a day (QID) | ORAL | Status: DC | PRN
  Administered 2019-02-22: 650 mg via ORAL
  Filled 2019-02-21: qty 2

## 2019-02-21 MED ORDER — SODIUM CHLORIDE 0.9 % IV SOLN
INTRAVENOUS | Status: DC
  Administered 2019-02-21 – 2019-02-23 (×2): via INTRAVENOUS

## 2019-02-21 MED ORDER — SODIUM CHLORIDE 0.9% FLUSH
3.0000 mL | Freq: Once | INTRAVENOUS | Status: AC
  Administered 2019-02-21: 14:00:00 3 mL via INTRAVENOUS

## 2019-02-21 MED ORDER — ONDANSETRON HCL 4 MG PO TABS: 4.0000 mg | ORAL_TABLET | Freq: Four times a day (QID) | ORAL | Status: DC | PRN

## 2019-02-21 MED ORDER — LIFITEGRAST 5 % OP SOLN
1.0000 [drp] | Freq: Two times a day (BID) | OPHTHALMIC | Status: DC
  Administered 2019-02-24: 1 [drp] via OPHTHALMIC

## 2019-02-21 MED ORDER — NYSTATIN 100000 UNIT/GM EX CREA
1.0000 "application " | TOPICAL_CREAM | Freq: Two times a day (BID) | CUTANEOUS | Status: DC
  Administered 2019-02-22: 1 via TOPICAL
  Filled 2019-02-21: qty 15

## 2019-02-21 MED ORDER — ENOXAPARIN SODIUM 60 MG/0.6ML ~~LOC~~ SOLN
1.0000 mg/kg | Freq: Two times a day (BID) | SUBCUTANEOUS | Status: DC
  Administered 2019-02-21 – 2019-02-24 (×5): 60 mg via SUBCUTANEOUS
  Filled 2019-02-21 (×5): qty 0.6

## 2019-02-21 MED ORDER — ONDANSETRON HCL 4 MG/2ML IJ SOLN
4.0000 mg | Freq: Once | INTRAMUSCULAR | Status: AC
  Administered 2019-02-21: 4 mg via INTRAVENOUS
  Filled 2019-02-21: qty 2

## 2019-02-21 MED ORDER — IOHEXOL 300 MG/ML  SOLN
100.0000 mL | Freq: Once | INTRAMUSCULAR | Status: AC | PRN
  Administered 2019-02-21: 100 mL via INTRAVENOUS

## 2019-02-21 NOTE — ED Notes (Signed)
This Programme researcher, broadcasting/film/video attempted to place NG tube. Patient told RN's to stop as the pain was too much. Patient refusing to allow another RN to attempt placement. Patient has not had any recent bouts of emesis and states "my stomach is fine"

## 2019-02-21 NOTE — ED Notes (Signed)
Patient aware that urine sample needed

## 2019-02-21 NOTE — ED Notes (Signed)
ED TO INPATIENT HANDOFF REPORT  ED Nurse Name and Phone #: [place] G, [phone]  S Name/Age/Gender Jeremy Kelley 85 y.o. male Room/Bed: WA20/WA20  Code Status   Code Status: Prior  Home/SNF/Other Home Patient oriented to: self, place, time and situation Is this baseline? Yes   Triage Complete: Triage complete  Chief Complaint abd pain, dizzy  Triage Note Patient c/o mid and lower abdominal pain since last night. Patient also c/o dizziness and states, "I can barely stand up." Patient states that he has an appointment with a neurologist for dizziness and being forgetful.   Allergies No Known Allergies  Level of Care/Admitting Diagnosis ED Disposition    ED Disposition Condition Comment   Admit  Hospital Area: [facility] [[id number]]  Level of Care: Med-Surg [16]  Covid Evaluation: Asymptomatic Screening Protocol (No Symptoms)  Diagn[facility]: [id number]  Admitting Physician: Truddie Hidden [id number]  Attending Physician: Truddie Hidden [id number]  Estimated length of stay: past midnight tomorrow  Certification:: I certify this patient will need inpatient services for at least 2 midnights  PT Class (Do Not Modify): Inpatient [101]  PT Acc Code (Do Not Modify): Private [1]       B Medical/Surgery History Past Medical History:  Diagnosis Date  . Arthritis   . Carotid artery stenosis 11/2008   bilateral 40-59% stenosis  . Colon polyps   . Diverticulosis   . Hepatitis    doesn't know which type 1969  . Hyperlipidemia   . Hypertension   . Numbness    fingertips  . Phimosis   . Prostate cancer ([place]) 2001  . Urothelial carcinoma of left distal ureter [facility])    Past Surgical History:  Procedure Laterality Date  . BIOPSY  10/01/2011   Procedure: BIOPSY;  Surgeon: [name], MD;  [facility];  Service: Urology;;  biopsy of bladder tumor  . BLADDER SUSPENSION  08/18/10   [name]  . CARDIAC CATHETERIZATION N/A  07/29/2015   Procedure: Right/Left Heart Cath and Coronary Angiography;  Surgeon: Peter M Martinique, MD;  Location: [place];  Service: Cardiovascular;  Laterality: N/A;  . CATARACT EXTRACTION W/ INTRAOCULAR LENS  IMPLANT, BILATERAL Bilateral   . CYSTOSCOPY W/ RETROGRADES  01/27/2012   Procedure: CYSTOSCOPY WITH RETROGRADE PYELOGRAM;  Surgeon: [name], MD;  [facility];  Service: Urology;  Laterality: Left;  . CYSTOSCOPY WITH RETROGRADE PYELOGRAM, URETEROSCOPY AND STENT PLACEMENT Left 11/24/2012   Procedure: CYSTOSCOPY WITH left RETROGRADE PYELOGRAM, bladder washings, ureteroscopy;  Surgeon: [name], MD;  [facility];  Service: Urology;  Laterality: Left;  . CYSTOSCOPY WITH RETROGRADE PYELOGRAM, URETEROSCOPY AND STENT PLACEMENT Bilateral 11/19/2013   Procedure: CYSTOSCOPY WITH BILATERAL RETROGRADE PYELOGRAM,;  Surgeon: [name], MD;  [facility];  Service: Urology;  Laterality: Bilateral;  . EYE SURGERY  09/24/10   left eye. "Retina surgery"  . INGUINAL HERNIA REPAIR  1960  . PROSTATE SURGERY     s/p laparoscopic prostate surgery  . SKIN BIOPSY  05/20/2017   Superficial and nodular basal cell carcinoma (left temple)    basal cell carcinoma with sclerosis (right temple)  . 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 SKIN CANCER EXCISION     a/p skin cancer right foot-non melanoma  . skin laceration    . TEE WITHOUT CARDIOVERSION N/A 07/29/2015   Procedure: TRANSESOPHAGEAL ECHOCARDIOGRAM (TEE);  Surgeon: Pixie Casino, MD;  Location: [facility] ENDOSCOPY;  Service: Cardiovascular;  Laterality: N/A;  cath to follow  . TRANSURETHRAL RESECTION OF BLADDER TUMOR  10/01/2011   Procedure: TRANSURETHRAL RESECTION OF BLADDER TUMOR (TURBT);  Surgeon: [name], MD;  [facility];  Service: Urology;  Laterality: N/A;  . TRANSURETHRAL RESECTION OF BLADDER TUMOR N/A 11/19/2013   Procedure: TRANSURETHRAL RESECTION OF BLADDER TUMOR  (TURBT);  Surgeon: [name], MD;  [facility];  Service: Urology;  Laterality: N/A;  . URETEROSCOPY  01/27/2012   Procedure: URETEROSCOPY;  Surgeon: [name], MD;  [facility];  Service: Urology;  Laterality: Left;  CYSTO, Left RETROGRADE PYELOGRPHY, LEFT URETEROSCOPY   . URINARY SPHINCTER IMPLANT    . VARICOSE VEIN SURGERY       A IV Location/Drains/Wounds Patient Lines/Drains/Airways Status   Active Line/Drains/Airways    Name:   Placement date:   Placement time:   Site:   Days:   Peripheral IV 02/21/19 Left Forearm   02/21/19    1103    Forearm   less than 1          Intake/Output Last 24 hours No intake or output data in the 24 hours ending 02/21/19 1914  Labs/Imaging Results for orders placed or performed during the hospital encounter of 02/21/19 (from the past 48 hour(s))  Lipase, blood     Status: None   Collection Time: 02/21/19 11:00 AM  Result Value Ref Range   Lipase 26 11 - 51 U/L    Comment: Performed at [facility], [place] [address], [place], [place] [address]  Comprehensive metabolic panel     Status: Abnormal   Collection Time: 02/21/19 11:00 AM  Result Value Ref Range   Sodium 138 135 - 145 mmol/L   Potassium 4.2 3.5 - 5.1 mmol/L   Chloride 101 98 - 111 mmol/L   CO2 28 22 - 32 mmol/L   Glucose, Bld 138 (H) 70 - 99 mg/dL   BUN 23 8 - 23 mg/dL   Creatinine, Ser 1.13 0.61 - 1.24 mg/dL   Calcium 9.8 8.9 - 10.3 mg/dL   Total Protein 7.0 6.5 - 8.1 g/dL   Albumin 4.0 3.5 - 5.0 g/dL   AST 31 15 - 41 U/L   ALT 17 0 - 44 U/L   Alkaline Phosphatase 79 38 - 126 U/L   Total Bilirubin 1.6 (H) 0.3 - 1.2 mg/dL   GFR calc non Af Amer 57 (L) >60 mL/min   GFR calc Af Amer >60 >60 mL/min   Anion gap 9 5 - 15    Comment: Performed at [facility], [place] [address] [place], [place] [address]  CBC     Status: Abnormal   Collection Time: 02/21/19 11:00 AM  Result Value Ref Range   WBC 15.8 (H) 4.0 - 10.5 K/uL   RBC  4.40 4.22 - 5.81 MIL/uL   Hemoglobin 14.2 13.0 - 17.0 g/dL   HCT 44.6 39.0 - 52.0 %   MCV 101.4 (H) 80.0 - 100.0 fL   MCH 32.3 26.0 - 34.0 pg   MCHC 31.8 30.0 - 36.0 g/dL   RDW 13.4 11.5 - 15.5 %   Platelets 225 150 - 400 K/uL   nRBC 0.0 0.0 - 0.2 %    Comment: Performed at [facility], [place] [address], [place],  [address]  Urinalysis, Routine w reflex microscopic     Status: None   Collection Time: 02/21/19 11:00 AM  Result Value Ref Range   Color,  Urine YELLOW YELLOW   APPearance CLEAR CLEAR   Specific Gravity, Urine 1.015 1.005 - 1.030   pH 7.0 5.0 - 8.0   Glucose, UA NEGATIVE NEGATIVE mg/dL   Hgb urine dipstick NEGATIVE NEGATIVE   Bilirubin Urine NEGATIVE NEGATIVE   Ketones, ur NEGATIVE NEGATIVE mg/dL   Protein, ur NEGATIVE NEGATIVE mg/dL   Nitrite NEGATIVE NEGATIVE   Leukocytes,Ua NEGATIVE NEGATIVE    Comment: Performed at [place] [address], [place], [place] [address]  Lactic acid, plasma     Status: None   Collection Time: 02/21/19  1:44 PM  Result Value Ref Range   Lactic Acid, Venous 1.4 0.5 - 1.9 mmol/L    Comment: Performed at [facility], [place] [address] [place], [place] [address]   Ct Abdomen Pelvis W Contrast  Result Date: 02/21/2019 CLINICAL DATA:  Nausea vomiting abdominal pain. EXAM: CT ABDOMEN AND PELVIS WITH CONTRAST TECHNIQUE: Multidetector CT imaging of the abdomen and pelvis was performed using the standard protocol following bolus administration of intravenous contrast. CONTRAST:  138mL OMNIPAQUE IOHEXOL 300 MG/ML  SOLN COMPARISON:  04/27/2018 FINDINGS: Lower chest: Heart is enlarged. Distal esophagus is fluid-filled. Emphysema noted in the lung bases. Hepatobiliary: No suspicious focal abnormality within the liver parenchyma. Gallbladder nondistended. No intrahepatic or extrahepatic biliary dilation. Pancreas: Pancreatic parenchyma diffusely atrophic. No dilatation of the main  pancreatic duct. Spleen: No splenomegaly. No focal mass lesion. Adrenals/Urinary Tract: No adrenal nodule or mass. Kidneys unremarkable. No evidence for hydroureter. The urinary bladder appears normal for the degree of distention. Stomach/Bowel: Stomach is markedly distended and fluid-filled. Duodenum is normally positioned as is the ligament of Treitz. Proximal small bowel loops are fluid-filled and distended up to 2.8 cm diameter. No small bowel wall thickening or small bowel pneumatosis. Terminal ileum is nondistended but fluid is visible in the lumen. Colon is decompressed throughout. Diverticular changes noted left colon without definite diverticulitis. Vascular/Lymphatic: There is abdominal aortic atherosclerosis without aneurysm. Portal vein and superior mesenteric vein are patent. Celiac axis, SMA, and IMA are patent. There is no gastrohepatic or hepatoduodenal ligament lymphadenopathy. No intraperitoneal or retroperitoneal lymphadenopathy. No pelvic sidewall lymphadenopathy. Reproductive: The prostate gland is apparently surgically absent. Calcification noted in the base of the penis, stable. Other: Trace free fluid noted in the pelvis and anterior to the hepatic dome. Musculoskeletal: Right lower quadrant surgical change likely related to prior herniorrhaphy No worrisome lytic or sclerotic osseous abnormality. IMPRESSION: 1. Markedly distended fluid-filled stomach with mildly distended fluid-filled small bowel. No evidence for small bowel wall thickening or pneumatosis. No abrupt transition zone. Terminal ileum is nondistended but is fluid-filled. Imaging features may reflect marked adynamic ileus or evolving small bowel obstruction. 2. Fluid in the distal esophagus, likely reflux. 3. Trace free fluid in the pelvis and anterior to the hepatic dome. 4. Status post prostatectomy. 5. Left colonic diverticulosis without diverticulitis. 6. Aortic Atherosclerosis (ICD10-I70.0) and Emphysema (ICD10-J43.9).  Electronically Signed   By: Misty Stanley M.D.   On: 02/21/2019 14:57    Pending Labs Unresulted Labs (From admission, onward)    Start     Ordered   02/21/19 1503  SARS CORONAVIRUS 2 (TAT 6-24 HRS) Nasopharyngeal Nasopharyngeal Swab  (Asymptomatic/Tier 2 Patients Labs)  Once,   STAT    Question Answer Comment  Is this test for diagnosis or screening Screening   Symptomatic for COVID-19 as defined by CDC No   Hospitalized for COVID-19 No   Admitted to ICU for COVID-19 No  Previously tested for COVID-19 Unknown   Resident in a congregate (group) care setting No   Employed in healthcare setting No      02/21/19 1502   02/21/19 1116  Urine culture  ONCE - STAT,   STAT     02/21/19 1116   Signed and Held  CBC  (enoxaparin (LOVENOX)    CrCl >/= 30 ml/min)  Once,   R    Comments: Baseline for enoxaparin therapy IF NOT ALREADY DRAWN.  Notify MD if PLT < 100 K.    Signed and Held   Signed and Held  Creatinine, serum  (enoxaparin (LOVENOX)    CrCl >/= 30 ml/min)  Once,   R    Comments: Baseline for enoxaparin therapy IF NOT ALREADY DRAWN.    Signed and Held   Signed and Held  Creatinine, serum  (enoxaparin (LOVENOX)    CrCl >/= 30 ml/min)  Weekly,   R    Comments: while on enoxaparin therapy    Signed and Held   Signed and Held  Basic metabolic panel  Tomorrow morning,   R     Signed and Held   Signed and Held  CBC  Tomorrow morning,   R     Signed and Held          Vitals/Pain Today's Vitals   02/21/19 1417 02/21/19 1557 02/21/19 1602 02/21/19 1913  BP:   128/78 129/67  Pulse:  76 80 71  Resp:  18 18 18   Temp:      TempSrc:      SpO2:  94% 96% 94%  Weight:      Height:      PainSc: 1   6      Isolation Precautions No active isolations  Medications Medications  sodium chloride (PF) 0.9 % injection (has no administration in time range)  sodium chloride flush (NS) 0.9 % injection 3 mL (3 mLs Intravenous Given 02/21/19 1355)  ondansetron (ZOFRAN) injection 4 mg (4 mg  Intravenous Given 02/21/19 1354)  fentaNYL (SUBLIMAZE) injection 25 mcg (25 mcg Intravenous Given 02/21/19 1355)  iohexol (OMNIPAQUE) 300 MG/ML solution 100 mL (100 mLs Intravenous Contrast Given 02/21/19 1435)    Mobility walks High fall risk

## 2019-02-21 NOTE — H&P (Signed)
History and Physical    Jeremy Kelley [id number] DOB: Jan 29, 1927 DOA: 02/21/2019  PCP: [name], NP  Patient coming from: home  I have personally briefly reviewed patient's old medical records in [place]  Chief Complaint: abdominal pain, nausea/vomiting  HPI: Jeremy Kelley is a 85 y.o. male with medical history significant of HTN, HLD, Carotid artery stenosis, Chronic atrial fibrillation on apixaban, AI/MR, Hx of prostate cancer, urothelial carcinoma, and remote inguinal hernia repair presenting with new abdominal pain, nausea/vomiting.  Patient reports onset of symptoms overnight.  He states he had dinner last night and food went down but then began to develop severe abdominal pain, nausea and vomiting.  He reports he had a friend bring him in this morning at 9 am due to worsening symptoms.  He states he has not passed any flatus this morning and no bowel movements.  He states he has not tried to eat anything.    He states he has once had an NGT before and reports upon attempted placement today in ER, he does not want it unless someone else tries and would prefere laparascopic surgery instead because it's less painful.  No fevers, chills.  No chest pain, palpitations, sob, cough.  No urinary symptoms.  He does not smoker, drink or use drugs.  He lives independently.  Uses a walking stick to ambulate longer distances.   Review of Systems: As per HPI otherwise 10 point review of systems negative.    Past Medical History:  Diagnosis Date   Arthritis    Carotid artery stenosis 11/2008   bilateral 40-59% stenosis   Colon polyps    Diverticulosis    Hepatitis    doesn't know which type 1969   Hyperlipidemia    Hypertension    Numbness    fingertips   Phimosis    Prostate cancer ([place]) 2001   Urothelial carcinoma of left distal ureter [facility])     Past Surgical History:  Procedure Laterality Date   BIOPSY  10/01/2011   Procedure: BIOPSY;   Surgeon: [name], MD;  Location: [facility];  Service: Urology;;  biopsy of bladder tumor   BLADDER SUSPENSION  08/18/10   [name]   CARDIAC CATHETERIZATION N/A 07/29/2015   Procedure: Right/Left Heart Cath and Coronary Angiography;  Surgeon: Peter M Martinique, MD;  Location: [facility];  Service: Cardiovascular;  Laterality: N/A;   CATARACT EXTRACTION W/ INTRAOCULAR LENS  IMPLANT, BILATERAL Bilateral    CYSTOSCOPY W/ RETROGRADES  01/27/2012   Procedure: CYSTOSCOPY WITH RETROGRADE PYELOGRAM;  Surgeon: [name], MD;  Location: [facility];  Service: Urology;  Laterality: Left;   CYSTOSCOPY WITH RETROGRADE PYELOGRAM, URETEROSCOPY AND STENT PLACEMENT Left 11/24/2012   Procedure: CYSTOSCOPY WITH left RETROGRADE PYELOGRAM, bladder washings, ureteroscopy;  Surgeon: [name], MD;  Location: [facility];  Service: Urology;  Laterality: Left;   CYSTOSCOPY WITH RETROGRADE PYELOGRAM, URETEROSCOPY AND STENT PLACEMENT Bilateral 11/19/2013   Procedure: CYSTOSCOPY WITH BILATERAL RETROGRADE PYELOGRAM,;  Surgeon: [name], MD;  Location: [facility];  Service: Urology;  Laterality: Bilateral;   EYE SURGERY  09/24/10   left eye. "Retina surgery"   INGUINAL HERNIA REPAIR  1960   PROSTATE SURGERY     s/p laparoscopic prostate surgery   SKIN BIOPSY  05/20/2017   Superficial and nodular basal cell carcinoma (left temple)    basal cell carcinoma with sclerosis (right temple)   SKIN BIOPSY Right 01/03/2019   INVASIVE SQUAMOUS CELL CARCINOMA EXTENDING TO THE DEEP MARGIN  SKIN CANCER EXCISION     a/p skin cancer right foot-non melanoma   skin laceration     TEE WITHOUT CARDIOVERSION N/A 07/29/2015   Procedure: TRANSESOPHAGEAL ECHOCARDIOGRAM (TEE);  Surgeon: Pixie Casino, MD;  Location: [facility];  Service: Cardiovascular;  Laterality: N/A;  cath to follow   TRANSURETHRAL RESECTION OF BLADDER TUMOR  10/01/2011   Procedure: TRANSURETHRAL RESECTION OF BLADDER TUMOR (TURBT);  Surgeon: [name], MD;   Location: [facility];  Service: Urology;  Laterality: N/A;   TRANSURETHRAL RESECTION OF BLADDER TUMOR N/A 11/19/2013   Procedure: TRANSURETHRAL RESECTION OF BLADDER TUMOR (TURBT);  Surgeon: [name], MD;  Location: [facility];  Service: Urology;  Laterality: N/A;   URETEROSCOPY  01/27/2012   Procedure: URETEROSCOPY;  Surgeon: [name], MD;  Location: [facility];  Service: Urology;  Laterality: Left;  CYSTO, Left RETROGRADE PYELOGRPHY, LEFT URETEROSCOPY    URINARY SPHINCTER IMPLANT     VARICOSE VEIN SURGERY       reports that he has never smoked. He has never used smokeless tobacco. He reports current alcohol use of about 14.0 standard drinks of alcohol per week. He reports that he does not use drugs.  No Known Allergies  Family History  Problem Relation Age of Onset   Colon cancer Neg Hx    Colon polyps Neg Hx     Prior to Admission medications   Medication Sig Start Date End Date Taking? Authorizing Provider  B Complex-C (B-COMPLEX WITH VITAMIN C) tablet Take 1 tablet by mouth daily. Reported on 07/24/2015 08/18/16   [name], NP  cholecalciferol (VITAMIN D) 1000 units tablet Take 1 tablet (1,000 Units total) by mouth daily. 08/18/16   Nafziger, [name], NP  ELIQUIS 2.5 MG TABS tablet TAKE 1 TABLET BY MOUTH TWICE A DAY 01/03/19   Nafziger, [name], NP  KLOR-CON M10 10 MEQ tablet Take 1 tablet (10 mEq total) by mouth 2 (two) times daily. 09/07/18   Nahser, [name], MD  methylPREDNISolone (MEDROL DOSEPAK) 4 MG TBPK tablet Take as directed 01/12/19   [name], NP  metoprolol succinate (TOPROL-XL) 25 MG 24 hr tablet TAKE 1 TABLET BY MOUTH EVERYDAY AT BEDTIME 12/20/18   Nafziger, [name], NP  mirabegron ER (MYRBETRIQ) 50 MG TB24 tablet Take 1 tablet (50 mg total) by mouth daily. 12/14/18   Nafziger, [name], NP  nystatin cream (MYCOSTATIN) Apply 1 application topically 2 (two) times daily. 12/22/18   Nafziger, [name], NP  polyethylene glycol (MIRALAX / GLYCOLAX) 17 g packet Take 17 g by mouth 2 (two)  times daily.    [provider]  pravastatin (PRAVACHOL) 20 MG tablet TAKE 1 TABLET BY MOUTH EVERYDAY AT BEDTIME 01/23/19   Nafziger, [name], NP  torsemide (DEMADEX) 20 MG tablet Take 20 mg by mouth daily.    [provider]  XIIDRA 5 % SOLN Place 1 drop into both eyes 2 (two) times a day. 08/22/18   [provider]    Physical Exam: Vitals:   02/21/19 1040 02/21/19 1133 02/21/19 1557 02/21/19 1602  BP:    128/78  Pulse:   76 80  Resp:   18 18  Temp:  99.4 F (37.4 C)    TempSrc:  Rectal    SpO2:   94% 96%  Weight: 61.2 kg     Height: 5\' 10"  (1.778 m)       Constitutional: NAD, calm, comfortable Vitals:   02/21/19 1040 02/21/19 1133 02/21/19 1557 02/21/19 1602  BP:    128/78  Pulse:   76 80  Resp:   18 18  Temp:  99.4 F (37.4 C)    TempSrc:  Rectal    SpO2:   94% 96%  Weight: 61.2 kg     Height: 5\' 10"  (1.778 m)      General: pleasant, thin, NAD Eyes: PERRL, EOMI ENMT: Mucous membranes are moist. Posterior pharynx clear of any exudate or lesions.Normal dentition.  Neck: normal, supple, no masses, no thyromegaly Respiratory: clear to auscultation bilaterally, no wheezing, no crackles. Normal respiratory effort. No accessory muscle use.  Cardiovascular: bradycardic rate, irregular rhythm, no murmurs / rubs / gallops. No extremity edema. 2+ pedal pulses. No carotid bruits.  Abdomen: tender throughout, most notable in LUQ, no rebound or guarding.  no masses palpated. No hepatosplenomegaly. Bowel sounds positive.  Musculoskeletal: no clubbing / cyanosis. No joint deformity upper and lower extremities. Good ROM, no contractures. Normal muscle tone.  Skin: recent excision of RLE lesion, stitches intact Neurologic: CN 2-12 grossly intact. Strength and sensation grossly intact.  Psychiatric: Alert and oriented x 3. Normal mood.    Labs on Admission: I have personally reviewed following labs and imaging studies  CBC: Recent Labs  Lab 02/21/19 1100    WBC 15.8*  HGB 14.2  HCT 44.6  MCV 101.4*  PLT 123456   Basic Metabolic Panel: Recent Labs  Lab 02/21/19 1100  NA 138  K 4.2  CL 101  CO2 28  GLUCOSE 138*  BUN 23  CREATININE 1.13  CALCIUM 9.8   GFR: Estimated Creatinine Clearance: 37.6 mL/min (by C-G formula based on SCr of 1.13 mg/dL). Liver Function Tests: Recent Labs  Lab 02/21/19 1100  AST 31  ALT 17  ALKPHOS 79  BILITOT 1.6*  PROT 7.0  ALBUMIN 4.0   Recent Labs  Lab 02/21/19 1100  LIPASE 26   No results for input(s): AMMONIA in the last 168 hours. Coagulation Profile: No results for input(s): INR, PROTIME in the last 168 hours. Cardiac Enzymes: No results for input(s): CKTOTAL, CKMB, CKMBINDEX, TROPONINI in the last 168 hours. BNP (last 3 results) Recent Labs    08/31/18 1247  PROBNP 459.0*   HbA1C: No results for input(s): HGBA1C in the last 72 hours. CBG: No results for input(s): GLUCAP in the last 168 hours. Lipid Profile: No results for input(s): CHOL, HDL, LDLCALC, TRIG, CHOLHDL, LDLDIRECT in the last 72 hours. Thyroid Function Tests: No results for input(s): TSH, T4TOTAL, FREET4, T3FREE, THYROIDAB in the last 72 hours. Anemia Panel: No results for input(s): VITAMINB12, FOLATE, FERRITIN, TIBC, IRON, RETICCTPCT in the last 72 hours. Urine analysis:    Component Value Date/Time   COLORURINE YELLOW 02/21/2019 1100   APPEARANCEUR CLEAR 02/21/2019 1100   LABSPEC 1.015 02/21/2019 1100   PHURINE 7.0 02/21/2019 1100   GLUCOSEU NEGATIVE 02/21/2019 1100   HGBUR NEGATIVE 02/21/2019 1100   BILIRUBINUR NEGATIVE 02/21/2019 1100   BILIRUBINUR n 07/15/2015 1320   KETONESUR NEGATIVE 02/21/2019 1100   PROTEINUR NEGATIVE 02/21/2019 1100   UROBILINOGEN 0.2 07/15/2015 1320   NITRITE NEGATIVE 02/21/2019 1100   LEUKOCYTESUR NEGATIVE 02/21/2019 1100    Radiological Exams on Admission: Ct Abdomen Pelvis W Contrast  Result Date: 02/21/2019 CLINICAL DATA:  Nausea vomiting abdominal pain. EXAM: CT ABDOMEN  AND PELVIS WITH CONTRAST TECHNIQUE: Multidetector CT imaging of the abdomen and pelvis was performed using the standard protocol following bolus administration of intravenous contrast. CONTRAST:  178mL OMNIPAQUE IOHEXOL 300 MG/ML  SOLN COMPARISON:  04/27/2018 FINDINGS: Lower chest: Heart is enlarged.  Distal esophagus is fluid-filled. Emphysema noted in the lung bases. Hepatobiliary: No suspicious focal abnormality within the liver parenchyma. Gallbladder nondistended. No intrahepatic or extrahepatic biliary dilation. Pancreas: Pancreatic parenchyma diffusely atrophic. No dilatation of the main pancreatic duct. Spleen: No splenomegaly. No focal mass lesion. Adrenals/Urinary Tract: No adrenal nodule or mass. Kidneys unremarkable. No evidence for hydroureter. The urinary bladder appears normal for the degree of distention. Stomach/Bowel: Stomach is markedly distended and fluid-filled. Duodenum is normally positioned as is the ligament of Treitz. Proximal small bowel loops are fluid-filled and distended up to 2.8 cm diameter. No small bowel wall thickening or small bowel pneumatosis. Terminal ileum is nondistended but fluid is visible in the lumen. Colon is decompressed throughout. Diverticular changes noted left colon without definite diverticulitis. Vascular/Lymphatic: There is abdominal aortic atherosclerosis without aneurysm. Portal vein and superior mesenteric vein are patent. Celiac axis, SMA, and IMA are patent. There is no gastrohepatic or hepatoduodenal ligament lymphadenopathy. No intraperitoneal or retroperitoneal lymphadenopathy. No pelvic sidewall lymphadenopathy. Reproductive: The prostate gland is apparently surgically absent. Calcification noted in the base of the penis, stable. Other: Trace free fluid noted in the pelvis and anterior to the hepatic dome. Musculoskeletal: Right lower quadrant surgical change likely related to prior herniorrhaphy No worrisome lytic or sclerotic osseous abnormality.  IMPRESSION: 1. Markedly distended fluid-filled stomach with mildly distended fluid-filled small bowel. No evidence for small bowel wall thickening or pneumatosis. No abrupt transition zone. Terminal ileum is nondistended but is fluid-filled. Imaging features may reflect marked adynamic ileus or evolving small bowel obstruction. 2. Fluid in the distal esophagus, likely reflux. 3. Trace free fluid in the pelvis and anterior to the hepatic dome. 4. Status post prostatectomy. 5. Left colonic diverticulosis without diverticulitis. 6. Aortic Atherosclerosis (ICD10-I70.0) and Emphysema (ICD10-J43.9). Electronically Signed   By: Misty Stanley M.D.   On: 02/21/2019 14:57    EKG: Independently reviewed.   Assessment/Plan Jeremy Kelley is a 85 y.o. male with medical history significant of HTN, HLD, Carotid artery stenosis, Chronic atrial fibrillation on apixaban, AI/MR, Hx of prostate cancer s/p prostatectomy, urothelial carcinoma, and remote inguinal hernia repair presenting with new abdominal pain, nausea/vomiting and imaging concern for developing SBO.  # Abdominal pain/N/V 2/2 ileus and possible developing SBO - symptoms and currently with absence of flatus and BM; CT shows markedly distended stomach, fluid-filled.  Proximal small bowel loops are fluid-filled and distended up to 2/8 cm in diameter.  Terminal ileum non-distended.  No abrupt transition zone noted. - attempted NGT decompression - patient refused, but amenable to re-trial - continue NPO, IVF and monitor electrolytes - monitor symptoms, monitor for flatus and BM - avoid opiates and other medications which may slow bowel, contribute to ileus - if failure to improve, would consider d/w surgery vs. Possible gastrograffin challenge given possibility of adhesion but for now will continue to monitor for improvement  # Chronic atrial fibrillation # Aortic insufficiency, Mitral regurg # Pulmonary HTN - have held oral agents at present, patient  also not volume overloaded at present - switched Apixaban to Lovenox - if diuresis to be resumed, can transition to IV  # HLD - held statin  # hx of Prostate Ca s/p prostatectomy # Hx of urothelia ca - no acute issues  # Malnutrition - patient with recent stable weights, BMI of 19.37 - holding PO intake for now  DVT prophylaxis: transitioned Apixaban to therapeutic lovenox, due to concern for developing SBO Code Status: DNR/DNI Disposition Plan: pending resolution of SBO Admission status:  inpatient   Truddie Hidden MD [facility] Pager [phone]  If 7PM-7AM, please contact night-coverage [email or web address] Password [facility]  02/21/2019, 5:56 PM

## 2019-02-21 NOTE — ED Notes (Signed)
Attempted to call report, but that RN is receiving report and requests that I call back in 5 min.

## 2019-02-21 NOTE — ED Provider Notes (Signed)
[facility] DEPT Provider Note   CSN: [id number] Arrival date & time: 02/21/19  [id number]     History   Chief Complaint Chief Complaint  Patient presents with   Abdominal Pain    HPI Jeremy Kelley is a 85 y.o. male resenting for evaluation of abdominal pain, nausea, vomiting.  Patient states around midnight he started develop generalized abdominal cramping.  He reports 3-4 episodes of emesis, though states he did not throw up very much.  Patient states he was feeling dizzy when he was having a lot of pain.  Patient tells me upon arrival to the ED he was feeling better, pain and nausea is improved.  Dizziness improved.  He denies fevers, chills, chest pain, shortness of breath, urinary symptoms.  He has not had a bowel movement yet today.  His last meal was last night.  Additional history obtained from chart review.  Patient with a history A. fib on Eliquis, carotid artery stenosis, diverticulosis, hepatitis, hyperlipidemia, hypertension.   Pt told my attending he is still having pain and nausea.      HPI  Past Medical History:  Diagnosis Date   Arthritis    Carotid artery stenosis 11/2008   bilateral 40-59% stenosis   Colon polyps    Diverticulosis    Hepatitis    doesn't know which type 1969   Hyperlipidemia    Hypertension    Numbness    fingertips   Phimosis    Prostate cancer ([place]) 2001   Urothelial carcinoma of left distal ureter [facility])     Patient Active Problem List   Diagnosis Date Noted   Closed pilon fracture, left, sequela 10/11/2016   Pain in left ankle and joints of left foot 10/04/2016   Idiopathic chronic venous hypertension of left lower extremity with ulcer and inflammation ([place]) 10/04/2016   Fatigue 11/04/2015   CAD (coronary artery disease) 08/01/2015   Pulmonary hypertension ([place]) 07/24/2015   Atrial fibrillation ([place]) 07/16/2015   Aortic insufficiency 01/04/2014   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 11/13/2013   Lentigo maligna of cheek ([place]) 09/19/2013   Actinic keratosis 11/06/2012   Macular degeneration 08/10/2012   Allergic rhinitis 08/10/2012   Bladder cancer ([place]) 10/19/2011   Eczema 02/23/2011   Hyperglycemia 02/11/2011   Heart murmur 09/10/2010   ABNORMAL THYROID FUNCTION TESTS 08/05/2009   ABNORMAL HEART RHYTHMS 07/11/2009   Hyperlipidemia 11/28/2008   CAROTID BRUIT 11/19/2008   UNSPECIFIED CATARACT 10/17/2008   PARESTHESIA 12/04/2007   Varicose veins of bilateral lower extremities with other complications [id number]   COLONIC POLYPS, HX OF 08/28/2007   Essential hypertension 02/08/2007   PROSTATE CANCER, HX OF 11/04/2006   INGUINAL HERNIA, HX OF 11/04/2006    Past Surgical History:  Procedure Laterality Date   BIOPSY  10/01/2011   Procedure: BIOPSY;  Surgeon: [name], MD;  Location: [facility];  Service: Urology;;  biopsy of bladder tumor   BLADDER SUSPENSION  08/18/10   [name]   CARDIAC CATHETERIZATION N/A 07/29/2015   Procedure: Right/Left Heart Cath and Coronary Angiography;  Surgeon: Peter M Martinique, MD;  Location: [facility];  Service: Cardiovascular;  Laterality: N/A;   CATARACT EXTRACTION W/ INTRAOCULAR LENS  IMPLANT, BILATERAL Bilateral    CYSTOSCOPY W/ RETROGRADES  01/27/2012   Procedure: CYSTOSCOPY WITH RETROGRADE PYELOGRAM;  Surgeon: [name], MD;  Location: [facility];  Service: Urology;  Laterality: Left;   CYSTOSCOPY WITH RETROGRADE PYELOGRAM, URETEROSCOPY AND STENT PLACEMENT  Left 11/24/2012   Procedure: CYSTOSCOPY WITH left RETROGRADE PYELOGRAM, bladder washings, ureteroscopy;  Surgeon: [name], MD;  Location: [facility];  Service: Urology;  Laterality: Left;   CYSTOSCOPY WITH RETROGRADE PYELOGRAM, URETEROSCOPY AND STENT PLACEMENT Bilateral 11/19/2013   Procedure: CYSTOSCOPY WITH BILATERAL RETROGRADE PYELOGRAM,;  Surgeon: [name], MD;  Location: [facility];  Service: Urology;  Laterality:  Bilateral;   EYE SURGERY  09/24/10   left eye. "Retina surgery"   [place]     s/p laparoscopic prostate surgery   SKIN BIOPSY  05/20/2017   Superficial and nodular basal cell carcinoma (left temple)    basal cell carcinoma with sclerosis (right temple)   SKIN BIOPSY Right 01/03/2019   INVASIVE SQUAMOUS CELL CARCINOMA EXTENDING TO THE DEEP MARGIN   SKIN CANCER EXCISION     a/p skin cancer right foot-non melanoma   skin laceration     TEE WITHOUT CARDIOVERSION N/A 07/29/2015   Procedure: TRANSESOPHAGEAL ECHOCARDIOGRAM (TEE);  Surgeon: Pixie Casino, MD;  Location: [facility];  Service: Cardiovascular;  Laterality: N/A;  cath to follow   TRANSURETHRAL RESECTION OF BLADDER TUMOR  10/01/2011   Procedure: TRANSURETHRAL RESECTION OF BLADDER TUMOR (TURBT);  Surgeon: [name], MD;  Location: [facility];  Service: Urology;  Laterality: N/A;   TRANSURETHRAL RESECTION OF BLADDER TUMOR N/A 11/19/2013   Procedure: TRANSURETHRAL RESECTION OF BLADDER TUMOR (TURBT);  Surgeon: [name], MD;  Location: [facility];  Service: Urology;  Laterality: N/A;   URETEROSCOPY  01/27/2012   Procedure: URETEROSCOPY;  Surgeon: [name], MD;  Location: [facility];  Service: Urology;  Laterality: Left;  CYSTO, Left RETROGRADE PYELOGRPHY, LEFT URETEROSCOPY    URINARY SPHINCTER IMPLANT     VARICOSE VEIN SURGERY          Home Medications    Prior to Admission medications   Medication Sig Start Date End Date Taking? Authorizing Provider  B Complex-C (B-COMPLEX WITH VITAMIN C) tablet Take 1 tablet by mouth daily. Reported on 07/24/2015 08/18/16   [name], NP  cholecalciferol (VITAMIN D) 1000 units tablet Take 1 tablet (1,000 Units total) by mouth daily. 08/18/16   Nafziger, [name], NP  ELIQUIS 2.5 MG TABS tablet TAKE 1 TABLET BY MOUTH TWICE A DAY 01/03/19   Nafziger, [name], NP  KLOR-CON M10 10 MEQ tablet Take 1 tablet (10 mEq total) by mouth 2 (two) times daily. 09/07/18    Nahser, [name], MD  methylPREDNISolone (MEDROL DOSEPAK) 4 MG TBPK tablet Take as directed 01/12/19   [name], NP  metoprolol succinate (TOPROL-XL) 25 MG 24 hr tablet TAKE 1 TABLET BY MOUTH EVERYDAY AT BEDTIME 12/20/18   Nafziger, [name], NP  mirabegron ER (MYRBETRIQ) 50 MG TB24 tablet Take 1 tablet (50 mg total) by mouth daily. 12/14/18   Nafziger, [name], NP  nystatin cream (MYCOSTATIN) Apply 1 application topically 2 (two) times daily. 12/22/18   Nafziger, [name], NP  polyethylene glycol (MIRALAX / GLYCOLAX) 17 g packet Take 17 g by mouth 2 (two) times daily.    [provider]  pravastatin (PRAVACHOL) 20 MG tablet TAKE 1 TABLET BY MOUTH EVERYDAY AT BEDTIME 01/23/19   Nafziger, [name], NP  torsemide (DEMADEX) 20 MG tablet Take 20 mg by mouth daily.    [provider]  XIIDRA 5 % SOLN Place 1 drop into both eyes 2 (two) times a day. 08/22/18   [provider]    Family History Family History  Problem Relation Age of Onset  Colon cancer Neg Hx    Colon polyps Neg Hx     Social History Social History   Tobacco Use   Smoking status: Never Smoker   Smokeless tobacco: Never Used   Tobacco comment: 07/29/2015 "might smoke a cigar once/year"  Substance Use Topics   Alcohol use: Yes    Alcohol/week: 14.0 standard drinks    Types: 14 Glasses of wine per week    Comment: 07/29/2015 "big glass of wine q night"   Drug use: No     Allergies   Patient has no known allergies.   Review of Systems Review of Systems  Gastrointestinal: Positive for abdominal pain, nausea and vomiting.  All other systems reviewed and are negative.    Physical Exam Updated Vital Signs BP 137/71 (BP Location: Left Arm)    Pulse 76    Temp 99.4 F (37.4 C) (Rectal)    Resp 18    Ht 5\' 10"  (1.778 m)    Wt 61.2 kg    SpO2 94%    BMI 19.37 kg/m   Physical Exam Vitals signs and nursing note reviewed.  Constitutional:      General: He is not in acute distress.    Appearance: He  is well-developed.     Comments: Elderly male who appears nontoxic  HENT:     Head: Normocephalic and atraumatic.  Eyes:     Conjunctiva/sclera: Conjunctivae normal.     Pupils: Pupils are equal, round, and reactive to light.  Neck:     Musculoskeletal: Normal range of motion and neck supple.  Cardiovascular:     Rate and Rhythm: Normal rate and regular rhythm.     Pulses: Normal pulses.  Pulmonary:     Effort: Pulmonary effort is normal. No respiratory distress.     Breath sounds: Normal breath sounds. No wheezing.  Abdominal:     General: There is no distension.     Palpations: Abdomen is soft.     Tenderness: There is abdominal tenderness.     Comments: Generalized abdominal tenderness.  No rigidity.  Negative rebound.  No signs of peritonitis.  Musculoskeletal: Normal range of motion.  Skin:    General: Skin is warm and dry.     Capillary Refill: Capillary refill takes less than 2 seconds.  Neurological:     Mental Status: He is alert and oriented to person, place, and time.      ED Treatments / Results  Labs (all labs ordered are listed, but only abnormal results are displayed) Labs Reviewed  COMPREHENSIVE METABOLIC PANEL - Abnormal; Notable for the following components:      Result Value   Glucose, Bld 138 (*)    Total Bilirubin 1.6 (*)    GFR calc non Af Amer 57 (*)    All other components within normal limits  CBC - Abnormal; Notable for the following components:   WBC 15.8 (*)    MCV 101.4 (*)    All other components within normal limits  URINE CULTURE  SARS CORONAVIRUS 2 (TAT 6-24 HRS)  LIPASE, BLOOD  URINALYSIS, ROUTINE W REFLEX MICROSCOPIC  LACTIC ACID, PLASMA  LACTIC ACID, PLASMA    EKG None  Radiology Ct Abdomen Pelvis W Contrast  Result Date: 02/21/2019 CLINICAL DATA:  Nausea vomiting abdominal pain. EXAM: CT ABDOMEN AND PELVIS WITH CONTRAST TECHNIQUE: Multidetector CT imaging of the abdomen and pelvis was performed using the standard protocol  following bolus administration of intravenous contrast. CONTRAST:  173mL OMNIPAQUE IOHEXOL 300 MG/ML  SOLN COMPARISON:  04/27/2018 FINDINGS: Lower chest: Heart is enlarged. Distal esophagus is fluid-filled. Emphysema noted in the lung bases. Hepatobiliary: No suspicious focal abnormality within the liver parenchyma. Gallbladder nondistended. No intrahepatic or extrahepatic biliary dilation. Pancreas: Pancreatic parenchyma diffusely atrophic. No dilatation of the main pancreatic duct. Spleen: No splenomegaly. No focal mass lesion. Adrenals/Urinary Tract: No adrenal nodule or mass. Kidneys unremarkable. No evidence for hydroureter. The urinary bladder appears normal for the degree of distention. Stomach/Bowel: Stomach is markedly distended and fluid-filled. Duodenum is normally positioned as is the ligament of Treitz. Proximal small bowel loops are fluid-filled and distended up to 2.8 cm diameter. No small bowel wall thickening or small bowel pneumatosis. Terminal ileum is nondistended but fluid is visible in the lumen. Colon is decompressed throughout. Diverticular changes noted left colon without definite diverticulitis. Vascular/Lymphatic: There is abdominal aortic atherosclerosis without aneurysm. Portal vein and superior mesenteric vein are patent. Celiac axis, SMA, and IMA are patent. There is no gastrohepatic or hepatoduodenal ligament lymphadenopathy. No intraperitoneal or retroperitoneal lymphadenopathy. No pelvic sidewall lymphadenopathy. Reproductive: The prostate gland is apparently surgically absent. Calcification noted in the base of the penis, stable. Other: Trace free fluid noted in the pelvis and anterior to the hepatic dome. Musculoskeletal: Right lower quadrant surgical change likely related to prior herniorrhaphy No worrisome lytic or sclerotic osseous abnormality. IMPRESSION: 1. Markedly distended fluid-filled stomach with mildly distended fluid-filled small bowel. No evidence for small bowel wall  thickening or pneumatosis. No abrupt transition zone. Terminal ileum is nondistended but is fluid-filled. Imaging features may reflect marked adynamic ileus or evolving small bowel obstruction. 2. Fluid in the distal esophagus, likely reflux. 3. Trace free fluid in the pelvis and anterior to the hepatic dome. 4. Status post prostatectomy. 5. Left colonic diverticulosis without diverticulitis. 6. Aortic Atherosclerosis (ICD10-I70.0) and Emphysema (ICD10-J43.9). Electronically Signed   By: Misty Stanley M.D.   On: 02/21/2019 14:57    Procedures Procedures (including critical care time)  Medications Ordered in ED Medications  sodium chloride (PF) 0.9 % injection (has no administration in time range)  sodium chloride flush (NS) 0.9 % injection 3 mL (3 mLs Intravenous Given 02/21/19 1355)  ondansetron (ZOFRAN) injection 4 mg (4 mg Intravenous Given 02/21/19 1354)  fentaNYL (SUBLIMAZE) injection 25 mcg (25 mcg Intravenous Given 02/21/19 1355)  iohexol (OMNIPAQUE) 300 MG/ML solution 100 mL (100 mLs Intravenous Contrast Given 02/21/19 1435)     Initial Impression / Assessment and Plan / ED Course  I have reviewed the triage vital signs and the nursing notes.  Pertinent labs & imaging results that were available during my care of the patient were reviewed by me and considered in my medical decision making (see chart for details).        Patient presenting for evaluation of abdominal pain, nausea, vomiting.  Physical exam shows patient appears nontoxic.  He does have generalized abdominal tenderness.  Patient states pain is worse in his lower abdomen.  Consider UTI versus kidney stone.  Consider colitis.  Consider gallstones due to worsening at night with associated nausea or vomiting.  Will obtain labs and CT abdomen pelvis for further evaluation. Case discussed with attending, Dr. Maryan Rued evaluated the pt.  Labs show elevated white count at 15.8.  Otherwise reassuring. Will order lactic based on  WBC.   CT shows significant distention of the stomach without obvious transition point.  Consider adynamic ileus versus evolving small bowel obstruction.  On reassessment, nausea is resolved, patient still some mild abdominal discomfort.  Will call for admission.  Discussed with Dr Andria Frames from [facility], pt to be admitted.   Final Clinical Impressions(s) / ED Diagnoses   Final diagnoses:  Gastric distention  Ileus of unspecified type [facility])    ED Discharge Orders    None       Franchot Heidelberg, PA-C 02/21/19 1601    Blanchie Dessert, MD 02/22/19 [phone]

## 2019-02-21 NOTE — ED Triage Notes (Signed)
Patient c/o mid and lower abdominal pain since last night. Patient also c/o dizziness and states, "I can barely stand up." Patient states that he has an appointment with a neurologist for dizziness and being forgetful.

## 2019-02-22 ENCOUNTER — Inpatient Hospital Stay (HOSPITAL_COMMUNITY): Payer: Medicare Other

## 2019-02-22 LAB — BASIC METABOLIC PANEL
Anion gap: 10 (ref 5–15)
BUN: 21 mg/dL (ref 8–23)
CO2: 26 mmol/L (ref 22–32)
Calcium: 8.9 mg/dL (ref 8.9–10.3)
Chloride: 99 mmol/L (ref 98–111)
Creatinine, Ser: 1 mg/dL (ref 0.61–1.24)
GFR calc Af Amer: >60 mL/min (ref >60)
GFR calc non Af Amer: >60 mL/min (ref >60)
Glucose, Bld: 110 mg/dL — ABNORMAL HIGH (ref 70–99)
Potassium: 4 mmol/L (ref 3.5–5.1)
Sodium: 135 mmol/L (ref 135–145)

## 2019-02-22 LAB — CBC
HCT: 42.4 % (ref 39.0–52.0)
Hemoglobin: 13.3 g/dL (ref 13.0–17.0)
MCH: 31.8 pg (ref 26.0–34.0)
MCHC: 31.4 g/dL (ref 30.0–36.0)
MCV: 101.4 fL — ABNORMAL HIGH (ref 80.0–100.0)
Platelets: 199 10*3/uL (ref 150–400)
RBC: 4.18 MIL/uL — ABNORMAL LOW (ref 4.22–5.81)
RDW: 13.4 % (ref 11.5–15.5)
WBC: 14.1 10*3/uL — ABNORMAL HIGH (ref 4.0–10.5)
nRBC: 0 % (ref 0.0–0.2)

## 2019-02-22 LAB — URINE CULTURE: Culture: NO GROWTH

## 2019-02-22 MED ORDER — POLYETHYLENE GLYCOL 3350 17 G PO PACK
17.0000 g | PACK | Freq: Every day | ORAL | Status: DC
  Administered 2019-02-22 – 2019-02-24 (×3): 17 g via ORAL
  Filled 2019-02-22 (×3): qty 1

## 2019-02-22 MED ORDER — BISACODYL 10 MG RE SUPP: 10.0000 mg | Freq: Once | RECTAL | Status: DC

## 2019-02-22 MED ORDER — FLEET ENEMA 7-19 GM/118ML RE ENEM
1.0000 | ENEMA | Freq: Once | RECTAL | Status: AC
  Administered 2019-02-22: 11:00:00 1 via RECTAL
  Filled 2019-02-22: qty 1

## 2019-02-22 MED ORDER — DOCUSATE SODIUM 100 MG PO CAPS
100.0000 mg | ORAL_CAPSULE | Freq: Two times a day (BID) | ORAL | Status: DC
  Administered 2019-02-22 – 2019-02-24 (×3): 100 mg via ORAL
  Filled 2019-02-22 (×4): qty 1

## 2019-02-22 NOTE — Progress Notes (Signed)
Pt refused for NG tube placement.

## 2019-02-22 NOTE — Consult Note (Signed)
[facility] Surgery Consult Note  Jeremy Kelley August 07, 1926  WU:[id number].    Requesting MD: Matcha Chief Complaint/Reason for Consult: sbo HPI:  Patient is a 85 year old male who presented to [facility] 11/11 with abdominal pain, nausea and vomiting since 11/10. Ate dinner 11/10 and developed severe abdominal pain, n/v shortly after. Symptoms progressed overnight and patient decided to come the ED. No flatus or BM. NGT insertion was attempted in ED but unsuccessful and patient reports that he does not think NGT can be placed due to his anatomy. He has some chronic dysphagia related to this. He also has chronic constipation and takes fiber for this. He is followed by [facility]. Currently patient denies abdominal pain or nausea, but has not had any return in bowel function. Past abdominal surgery includes prostatectomy, bladder suspension, and inguinal hernia repair. PMH otherwise significant for HTN, Hx of prostate and bladder cancer. Patient is fairly independent at home. He reports that if needed he would want surgery.   ROS: Review of Systems  Constitutional: Negative for chills and fever.  Respiratory: Negative for shortness of breath and wheezing.   Cardiovascular: Negative for chest pain and palpitations.  Gastrointestinal: Positive for abdominal pain, constipation, nausea and vomiting. Negative for blood in stool, diarrhea and melena.       Dysphagia  Genitourinary: Negative for dysuria, frequency and urgency.  All other systems reviewed and are negative.   Family History  Problem Relation Age of Onset  . Colon cancer Neg Hx   . Colon polyps Neg Hx     Past Medical History:  Diagnosis Date  . Arthritis   . Carotid artery stenosis 11/2008   bilateral 40-59% stenosis  . Colon polyps   . Diverticulosis   . Hepatitis    doesn't know which type 1969  . Hyperlipidemia   . Hypertension   . Numbness    fingertips  . Phimosis   . Prostate cancer ([place]) 2001  . Urothelial  carcinoma of left distal ureter [facility])     Past Surgical History:  Procedure Laterality Date  . BIOPSY  10/01/2011   Procedure: BIOPSY;  Surgeon: [name], MD;  Location: [facility];  Service: Urology;;  biopsy of bladder tumor  . BLADDER SUSPENSION  08/18/10   [name]  . CARDIAC CATHETERIZATION N/A 07/29/2015   Procedure: Right/Left Heart Cath and Coronary Angiography;  Surgeon: Peter M Martinique, MD;  Location: [place] [facility] LAB;  Service: Cardiovascular;  Laterality: N/A;  . CATARACT EXTRACTION W/ INTRAOCULAR LENS  IMPLANT, BILATERAL Bilateral   . CYSTOSCOPY W/ RETROGRADES  01/27/2012   Procedure: CYSTOSCOPY WITH RETROGRADE PYELOGRAM;  Surgeon: [name], MD;  Location: [facility];  Service: Urology;  Laterality: Left;  . CYSTOSCOPY WITH RETROGRADE PYELOGRAM, URETEROSCOPY AND STENT PLACEMENT Left 11/24/2012   Procedure: CYSTOSCOPY WITH left RETROGRADE PYELOGRAM, bladder washings, ureteroscopy;  Surgeon: [name], MD;  Location: [facility];  Service: Urology;  Laterality: Left;  . CYSTOSCOPY WITH RETROGRADE PYELOGRAM, URETEROSCOPY AND STENT PLACEMENT Bilateral 11/19/2013   Procedure: CYSTOSCOPY WITH BILATERAL RETROGRADE PYELOGRAM,;  Surgeon: [name], MD;  Location: [facility];  Service: Urology;  Laterality: Bilateral;  . EYE SURGERY  09/24/10   left eye. "Retina surgery"  . INGUINAL HERNIA REPAIR  1960  . PROSTATE SURGERY     s/p laparoscopic prostate surgery  . SKIN BIOPSY  05/20/2017   Superficial and nodular basal cell carcinoma (left temple)    basal cell carcinoma with sclerosis (right temple)  . SKIN  BIOPSY Right 01/03/2019   INVASIVE SQUAMOUS CELL CARCINOMA EXTENDING TO THE DEEP MARGIN  . SKIN CANCER EXCISION     a/p skin cancer right foot-non melanoma  . skin laceration    . TEE WITHOUT CARDIOVERSION N/A 07/29/2015   Procedure: TRANSESOPHAGEAL ECHOCARDIOGRAM (TEE);  Surgeon: Pixie Casino, MD;  Location: [facility] ENDOSCOPY;  Service: Cardiovascular;  Laterality: N/A;  cath to follow  .  TRANSURETHRAL RESECTION OF BLADDER TUMOR  10/01/2011   Procedure: TRANSURETHRAL RESECTION OF BLADDER TUMOR (TURBT);  Surgeon: [name], MD;  Location: [facility];  Service: Urology;  Laterality: N/A;  . TRANSURETHRAL RESECTION OF BLADDER TUMOR N/A 11/19/2013   Procedure: TRANSURETHRAL RESECTION OF BLADDER TUMOR (TURBT);  Surgeon: [name], MD;  Location: [facility];  Service: Urology;  Laterality: N/A;  . URETEROSCOPY  01/27/2012   Procedure: URETEROSCOPY;  Surgeon: [name], MD;  Location: [facility];  Service: Urology;  Laterality: Left;  CYSTO, Left RETROGRADE PYELOGRPHY, LEFT URETEROSCOPY   . URINARY SPHINCTER IMPLANT    . VARICOSE VEIN SURGERY      Social History:  reports that he has never smoked. He has never used smokeless tobacco. He reports current alcohol use of about 14.0 standard drinks of alcohol per week. He reports that he does not use drugs.  Allergies: No Known Allergies  Medications Prior to Admission  Medication Sig Dispense Refill  . B Complex-C (B-COMPLEX WITH VITAMIN C) tablet Take 1 tablet by mouth daily. Reported on 07/24/2015 90 tablet 3  . cholecalciferol (VITAMIN D) 1000 units tablet Take 1 tablet (1,000 Units total) by mouth daily. 90 tablet 3  . ELIQUIS 2.5 MG TABS tablet TAKE 1 TABLET BY MOUTH TWICE A DAY 180 tablet 1  . KLOR-CON M10 10 MEQ tablet Take 1 tablet (10 mEq total) by mouth 2 (two) times daily. (Patient taking differently: Take 10 mEq by mouth daily. ) 180 tablet 3  . metoprolol succinate (TOPROL-XL) 25 MG 24 hr tablet TAKE 1 TABLET BY MOUTH EVERYDAY AT BEDTIME (Patient taking differently: Take 25 mg by mouth daily. ) 90 tablet 1  . mirabegron ER (MYRBETRIQ) 50 MG TB24 tablet Take 1 tablet (50 mg total) by mouth daily. 90 tablet 1  . polyethylene glycol (MIRALAX / GLYCOLAX) 17 g packet Take 17 g by mouth daily as needed for mild constipation.     . pravastatin (PRAVACHOL) 20 MG tablet TAKE 1 TABLET BY MOUTH EVERYDAY AT BEDTIME (Patient taking differently:  Take 20 mg by mouth daily. ) 90 tablet 1  . torsemide (DEMADEX) 20 MG tablet Take 20 mg by mouth daily.    [name] XIIDRA 5 % SOLN Place 1 drop into both eyes 2 (two) times a day.      Blood pressure 117/62, pulse 64, temperature 98.5 F (36.9 C), temperature source Oral, resp. rate 16, height 5\' 10"  (1.778 m), weight 61.2 kg, SpO2 96 %. Physical Exam: Physical Exam Constitutional:      General: He is not in acute distress.    Appearance: He is well-developed. He is cachectic. He is not toxic-appearing.  HENT:     Head: Normocephalic and atraumatic.     Right Ear: External ear normal.     Left Ear: External ear normal.     Nose: Nose normal.  Eyes:     General: No scleral icterus.    Extraocular Movements: Extraocular movements intact.     Conjunctiva/sclera: Conjunctivae normal.  Neck:     Musculoskeletal: Normal range of motion and neck  supple.  Cardiovascular:     Rate and Rhythm: Normal rate and regular rhythm.  Pulmonary:     Effort: Pulmonary effort is normal.     Breath sounds: Normal breath sounds.  Abdominal:     General: Bowel sounds are decreased. There is no distension.     Palpations: Abdomen is soft.     Tenderness: There is no abdominal tenderness. There is no guarding or rebound.     Hernia: No hernia is present.  Musculoskeletal:     Comments: ROM grossly intact in bilateral upper and lower extremities  Skin:    General: Skin is warm and dry.     Comments: Incision/lac to RLE with sutures in place  Neurological:     Mental Status: He is alert and oriented to person, place, and time.     Gait: Gait is intact.  Psychiatric:        Attention and Perception: Attention and perception normal.        Mood and Affect: Mood and affect normal.        Speech: Speech normal.        Behavior: Behavior normal. Behavior is cooperative.     Results for orders placed or performed during the hospital encounter of 02/21/19 (from the past 48 hour(s))  Lipase, blood      Status: None   Collection Time: 02/21/19 11:00 AM  Result Value Ref Range   Lipase 26 11 - 51 U/L    Comment: Performed at [facility], [place] [address] [place], [place] [address]  Comprehensive metabolic panel     Status: Abnormal   Collection Time: 02/21/19 11:00 AM  Result Value Ref Range   Sodium 138 135 - 145 mmol/L   Potassium 4.2 3.5 - 5.1 mmol/L   Chloride 101 98 - 111 mmol/L   CO2 28 22 - 32 mmol/L   Glucose, Bld 138 (H) 70 - 99 mg/dL   BUN 23 8 - 23 mg/dL   Creatinine, Ser 1.13 0.61 - 1.24 mg/dL   Calcium 9.8 8.9 - 10.3 mg/dL   Total Protein 7.0 6.5 - 8.1 g/dL   Albumin 4.0 3.5 - 5.0 g/dL   AST 31 15 - 41 U/L   ALT 17 0 - 44 U/L   Alkaline Phosphatase 79 38 - 126 U/L   Total Bilirubin 1.6 (H) 0.3 - 1.2 mg/dL   GFR calc non Af Amer 57 (L) >60 mL/min   GFR calc Af Amer >60 >60 mL/min   Anion gap 9 5 - 15    Comment: Performed at [facility], [place] [address] [place], [place] [address]  CBC     Status: Abnormal   Collection Time: 02/21/19 11:00 AM  Result Value Ref Range   WBC 15.8 (H) 4.0 - 10.5 K/uL   RBC 4.40 4.22 - 5.81 MIL/uL   Hemoglobin 14.2 13.0 - 17.0 g/dL   HCT 44.6 39.0 - 52.0 %   MCV 101.4 (H) 80.0 - 100.0 fL   MCH 32.3 26.0 - 34.0 pg   MCHC 31.8 30.0 - 36.0 g/dL   RDW 13.4 11.5 - 15.5 %   Platelets 225 150 - 400 K/uL   nRBC 0.0 0.0 - 0.2 %    Comment: Performed at [facility], [place] [address], [place], [place] [address]  Urinalysis, Routine w reflex microscopic     Status: None   Collection Time: 02/21/19 11:00 AM  Result Value Ref Range  Color, Urine YELLOW YELLOW   APPearance CLEAR CLEAR   Specific Gravity, Urine 1.015 1.005 - 1.030   pH 7.0 5.0 - 8.0   Glucose, UA NEGATIVE NEGATIVE mg/dL   Hgb urine dipstick NEGATIVE NEGATIVE   Bilirubin Urine NEGATIVE NEGATIVE   Ketones, ur NEGATIVE NEGATIVE mg/dL   Protein, ur NEGATIVE NEGATIVE mg/dL   Nitrite NEGATIVE NEGATIVE   Leukocytes,Ua  NEGATIVE NEGATIVE    Comment: Performed at [place] [address], [place], [place] [address]  Urine culture     Status: None   Collection Time: 02/21/19 11:16 AM   Specimen: Urine, Clean Catch  Result Value Ref Range   Specimen Description      URINE, CLEAN CATCH Performed at [facility], [place] [address] [place], [place] [address]    Special Requests      NONE Performed at [facility], [place] [address] [place],  [place] [address]    Culture      NO GROWTH Performed at [facility], [place] [address], [place], [place] [address]    Report Status 02/22/2019 FINAL   Lactic acid, plasma     Status: None   Collection Time: 02/21/19  1:44 PM  Result Value Ref Range   Lactic Acid, Venous 1.4 0.5 - 1.9 mmol/L    Comment: Performed at [facility], [place] [address], [place], [place] [address]  SARS CORONAVIRUS 2 (TAT 6-24 HRS) Nasopharyngeal Nasopharyngeal Swab     Status: None   Collection Time: 02/21/19  3:03 PM   Specimen: Nasopharyngeal Swab  Result Value Ref Range   SARS Coronavirus 2 NEGATIVE NEGATIVE    Comment: (NOTE) SARS-CoV-2 target nucleic acids are NOT DETECTED. The SARS-CoV-2 RNA is generally detectable in upper and lower respiratory specimens during the acute phase of infection. Negative results do not preclude SARS-CoV-2 infection, do not rule out co-infections with other pathogens, and should not be used as the sole basis for treatment or other patient management decisions. Negative results must be combined with clinical observations, patient history, and epidemiological information. The expected result is Negative. Fact Sheet for Patients: SugarRoll.be Fact Sheet for Healthcare Providers: https://www.woods-mathews.com/ This test is not yet approved or cleared by the [place] FDA and  has been authorized for detection and/or diagnosis of  SARS-CoV-2 by FDA under an Emergency Use Authorization (EUA). This EUA will remain  in effect (meaning this test can be used) for the duration of the COVID-19 declaration under Section 56 4(b)(1) of the Act, 21 U.S.C. section 360bbb-3(b)(1), unless the authorization is terminated or revoked sooner. Performed at [facility], [place] [address], [place], [place] [address]   Glucose, capillary     Status: None   Collection Time: 02/21/19 10:43 PM  Result Value Ref Range   Glucose-Capillary 99 70 - 99 mg/dL  Basic metabolic panel     Status: Abnormal   Collection Time: 02/22/19  2:51 AM  Result Value Ref Range   Sodium 135 135 - 145 mmol/L   Potassium 4.0 3.5 - 5.1 mmol/L   Chloride 99 98 - 111 mmol/L   CO2 26 22 - 32 mmol/L   Glucose, Bld 110 (H) 70 - 99 mg/dL   BUN 21 8 - 23 mg/dL   Creatinine, Ser 1.00 0.61 - 1.24 mg/dL   Calcium 8.9 8.9 - 10.3 mg/dL   GFR calc non Af Amer >60 >60 mL/min   GFR calc Af Amer >60 >60 mL/min  Anion gap 10 5 - 15    Comment: Performed at [facility], [place] [address] [place], [place] [address]  CBC     Status: Abnormal   Collection Time: 02/22/19  2:51 AM  Result Value Ref Range   WBC 14.1 (H) 4.0 - 10.5 K/uL   RBC 4.18 (L) 4.22 - 5.81 MIL/uL   Hemoglobin 13.3 13.0 - 17.0 g/dL   HCT 42.4 39.0 - 52.0 %   MCV 101.4 (H) 80.0 - 100.0 fL   MCH 31.8 26.0 - 34.0 pg   MCHC 31.4 30.0 - 36.0 g/dL   RDW 13.4 11.5 - 15.5 %   Platelets 199 150 - 400 K/uL   nRBC 0.0 0.0 - 0.2 %    Comment: Performed at [facility], [place] [address], [place], [place] [address]   Ct Abdomen Pelvis W Contrast  Result Date: 02/21/2019 CLINICAL DATA:  Nausea vomiting abdominal pain. EXAM: CT ABDOMEN AND PELVIS WITH CONTRAST TECHNIQUE: Multidetector CT imaging of the abdomen and pelvis was performed using the standard protocol following bolus administration of intravenous contrast. CONTRAST:  155mL OMNIPAQUE IOHEXOL 300 MG/ML  SOLN  COMPARISON:  04/27/2018 FINDINGS: Lower chest: Heart is enlarged. Distal esophagus is fluid-filled. Emphysema noted in the lung bases. Hepatobiliary: No suspicious focal abnormality within the liver parenchyma. Gallbladder nondistended. No intrahepatic or extrahepatic biliary dilation. Pancreas: Pancreatic parenchyma diffusely atrophic. No dilatation of the main pancreatic duct. Spleen: No splenomegaly. No focal mass lesion. Adrenals/Urinary Tract: No adrenal nodule or mass. Kidneys unremarkable. No evidence for hydroureter. The urinary bladder appears normal for the degree of distention. Stomach/Bowel: Stomach is markedly distended and fluid-filled. Duodenum is normally positioned as is the ligament of Treitz. Proximal small bowel loops are fluid-filled and distended up to 2.8 cm diameter. No small bowel wall thickening or small bowel pneumatosis. Terminal ileum is nondistended but fluid is visible in the lumen. Colon is decompressed throughout. Diverticular changes noted left colon without definite diverticulitis. Vascular/Lymphatic: There is abdominal aortic atherosclerosis without aneurysm. Portal vein and superior mesenteric vein are patent. Celiac axis, SMA, and IMA are patent. There is no gastrohepatic or hepatoduodenal ligament lymphadenopathy. No intraperitoneal or retroperitoneal lymphadenopathy. No pelvic sidewall lymphadenopathy. Reproductive: The prostate gland is apparently surgically absent. Calcification noted in the base of the penis, stable. Other: Trace free fluid noted in the pelvis and anterior to the hepatic dome. Musculoskeletal: Right lower quadrant surgical change likely related to prior herniorrhaphy No worrisome lytic or sclerotic osseous abnormality. IMPRESSION: 1. Markedly distended fluid-filled stomach with mildly distended fluid-filled small bowel. No evidence for small bowel wall thickening or pneumatosis. No abrupt transition zone. Terminal ileum is nondistended but is fluid-filled.  Imaging features may reflect marked adynamic ileus or evolving small bowel obstruction. 2. Fluid in the distal esophagus, likely reflux. 3. Trace free fluid in the pelvis and anterior to the hepatic dome. 4. Status post prostatectomy. 5. Left colonic diverticulosis without diverticulitis. 6. Aortic Atherosclerosis (ICD10-I70.0) and Emphysema (ICD10-J43.9). Electronically Signed   By: Misty Stanley M.D.   On: 02/21/2019 14:57      Assessment/Plan HTN HLD Hx of prostate cancer s/p laparoscopic prostatectomy in 2001 Hx of bladder cancer s/p TURBT Dysphagia - followed by ENT and GI for this   Chronic constipation SBO vs ileus - CT 11/11: distended fluid-filled stomach with mildly distended fluid filled small bowel, possible adynamic ileus vs evolving sbo - patient abdominal exam fairly benign this AM - repeat film - appears to be a  large stool burden in rectum on CT, pt reports hx of constipation - pt would like to avoid NGT if able due to chronic dysphagia related to anatomic difference - pt may need insertion of NGT in radiology if stomach still markedly dilated - discussed possibility of surgical intervention with patient who expressed that he would want surgery if needed - no emergent surgical intervention indicated at this time - mobilize as able  FEN: NPO, IVF VTE: SCDs, lovenox ID: no current abx  Brigid Re, [facility] Surgery 02/22/2019, 9:13 AM Please see Amion for pager number during day hours 7:00am-4:30pm

## 2019-02-22 NOTE — Progress Notes (Addendum)
PROGRESS NOTE    Jeremy Kelley  [id number] DOB: 20-Feb-1927 DOA: 02/21/2019 PCP: [name], NP    Brief Narrative:  Jeremy Kelley is a 85 y.o. male with medical history significant of HTN, HLD, Carotid artery stenosis, Chronic atrial fibrillation on apixaban, AI/MR, Hx of prostate cancer, urothelial carcinoma, and remote inguinal hernia repair presented with new abdominal pain, nausea/vomiting. Imaging concerning for developing SBO.  He is continuing to complain of abdominal pain.  He is refusing NG tube placement.   Assessment & Plan:   Active Problems:   Small bowel obstruction (HCC)  # Abdominal pain/N/V 2/2 ileus and possible developing SBO - symptoms and currently with absence of flatus and BM; CT showed markedly distended stomach, fluid-filled.  Proximal small bowel loops are fluid-filled and distended up to 2/8 cm in diameter.  Terminal ileum non-distended.  No abrupt transition zone noted. - attempted NGT decompression.  However, patient keeps refusing.  He says he has a narrow passage of and does not think NG tube can be placed. - continue NPO, IVF and monitor electrolytes - avoid opiates and other medications which may slow bowel, contribute to ileus -Consulted surgery.  # Leukocytosis -Likely reactive?  No evidence of infectious etiology at this time.  Continue to monitor.  If worsening WBC count or fever and consider starting empiric antimicrobials.  # Chronic atrial fibrillation -On metoprolol at home.  However, currently n.p.o. -Heart rate at this time stable.  However, the heart rate is increasing or widely fluctuant consider IV metoprolol.  # Aortic insufficiency, Mitral regurg # Pulmonary HTN - held oral agents at present given the SBO, patient also not volume overloaded at present - switched Apixaban to Lovenox - if diuresis to be resumed, can transition to IV  # HLD - held statin  # hx of Prostate Ca s/p prostatectomy # Hx of urothelia ca  - no acute issues  # Malnutrition - patient with recent stable weights, BMI of 19.37 - holding PO intake for now  DVT prophylaxis: transitioned Apixaban to therapeutic lovenox, oral meds held due to concern for developing SBO Code Status: DNR/DNI Disposition Plan: pending resolution of SBO   Consultants:   Surgery   Procedures:   None  Antimicrobials:  None  Subjective: He is continuing to complain of some abdominal discomfort.  Says that he is not passing any gas.  Refusing NG tube placement.  Objective: Vitals:   02/21/19 1913 02/21/19 1959 02/21/19 2100 02/22/19 0539  BP: 129/67 (!) 145/75 138/70 117/62  Pulse: 71 80 77 64  Resp: 18 18 18 16   Temp:  97.8 F (36.6 C) 97.9 F (36.6 C) 98.5 F (36.9 C)  TempSrc:  Oral Oral Oral  SpO2: 94% 93% 92% 96%  Weight:      Height:        Intake/Output Summary (Last 24 hours) at 02/22/2019 1300 Last data filed at 02/22/2019 0950 Gross per 24 hour  Intake 796.23 ml  Output 0 ml  Net 796.23 ml   Filed Weights   02/21/19 1040  Weight: 61.2 kg    Examination:  General exam: Thin male, awake and oriented, not in any acute distress at this time Respiratory system: Clear to auscultation anteriorly. Respiratory effort normal. Cardiovascular system: Irregular, no murmur Gastrointestinal system: Nonspecific tenderness without any guarding or rebound, diminished bowel sounds Central nervous system: Alert and oriented.  Hard of hearing.  No focal neurological deficits. Extremities: No lower extremity edema Skin: No rashes, recent excision of  right lower extremity lesion with stitches. Psychiatry: Mood & affect appropriate.     Data Reviewed: I have personally reviewed following labs and imaging studies  CBC: Recent Labs  Lab 02/21/19 1100 02/22/19 0251  WBC 15.8* 14.1*  HGB 14.2 13.3  HCT 44.6 42.4  MCV 101.4* 101.4*  PLT 225 [id number]   Basic Metabolic Panel: Recent Labs  Lab 02/21/19 1100 02/22/19 0251   NA 138 135  K 4.2 4.0  CL 101 99  CO2 28 26  GLUCOSE 138* 110*  BUN 23 21  CREATININE 1.13 1.00  CALCIUM 9.8 8.9   GFR: Estimated Creatinine Clearance: 42.5 mL/min (by C-G formula based on SCr of 1 mg/dL). Liver Function Tests: Recent Labs  Lab 02/21/19 1100  AST 31  ALT 17  ALKPHOS 79  BILITOT 1.6*  PROT 7.0  ALBUMIN 4.0   Recent Labs  Lab 02/21/19 1100  LIPASE 26   No results for input(s): AMMONIA in the last 168 hours. Coagulation Profile: No results for input(s): INR, PROTIME in the last 168 hours. Cardiac Enzymes: No results for input(s): CKTOTAL, CKMB, CKMBINDEX, TROPONINI in the last 168 hours. BNP (last 3 results) Recent Labs    08/31/18 1247  PROBNP 459.0*   HbA1C: No results for input(s): HGBA1C in the last 72 hours. CBG: Recent Labs  Lab 02/21/19 2243  GLUCAP 99   Lipid Profile: No results for input(s): CHOL, HDL, LDLCALC, TRIG, CHOLHDL, LDLDIRECT in the last 72 hours. Thyroid Function Tests: No results for input(s): TSH, T4TOTAL, FREET4, T3FREE, THYROIDAB in the last 72 hours. Anemia Panel: No results for input(s): VITAMINB12, FOLATE, FERRITIN, TIBC, IRON, RETICCTPCT in the last 72 hours. Sepsis Labs: Recent Labs  Lab 02/21/19 1344  LATICACIDVEN 1.4    Recent Results (from the past 240 hour(s))  Urine culture     Status: None   Collection Time: 02/21/19 11:16 AM   Specimen: Urine, Clean Catch  Result Value Ref Range Status   Specimen Description   Final    URINE, CLEAN CATCH Performed at [facility], [place] [address], [place], [place] [address]    Special Requests   Final    NONE Performed at [facility], [place] [address], [place], [place] [address]    Culture   Final    NO GROWTH Performed at [facility], [place] [address] [place], [place] [address]    Report Status 02/22/2019 FINAL  Final  SARS CORONAVIRUS 2 (TAT 6-24 HRS) Nasopharyngeal Nasopharyngeal Swab     Status: None    Collection Time: 02/21/19  3:03 PM   Specimen: Nasopharyngeal Swab  Result Value Ref Range Status   SARS Coronavirus 2 NEGATIVE NEGATIVE Final    Comment: (NOTE) SARS-CoV-2 target nucleic acids are NOT DETECTED. The SARS-CoV-2 RNA is generally detectable in upper and lower respiratory specimens during the acute phase of infection. Negative results do not preclude SARS-CoV-2 infection, do not rule out co-infections with other pathogens, and should not be used as the sole basis for treatment or other patient management decisions. Negative results must be combined with clinical observations, patient history, and epidemiological information. The expected result is Negative. Fact Sheet for Patients: SugarRoll.be Fact Sheet for Healthcare Providers: https://www.woods-mathews.com/ This test is not yet approved or cleared by the [place] FDA and  has been authorized for detection and/or diagnosis of SARS-CoV-2 by FDA under an Emergency Use Authorization (EUA). This EUA will remain  in effect (meaning this test can be used)  for the duration of the COVID-19 declaration under Section 56 4(b)(1) of the Act, 21 U.S.C. section 360bbb-3(b)(1), unless the authorization is terminated or revoked sooner. Performed at [facility], [place] [address] [place], [place] [address]          Radiology Studies: Ct Abdomen Pelvis W Contrast  Result Date: 02/21/2019 CLINICAL DATA:  Nausea vomiting abdominal pain. EXAM: CT ABDOMEN AND PELVIS WITH CONTRAST TECHNIQUE: Multidetector CT imaging of the abdomen and pelvis was performed using the standard protocol following bolus administration of intravenous contrast. CONTRAST:  111mL OMNIPAQUE IOHEXOL 300 MG/ML  SOLN COMPARISON:  04/27/2018 FINDINGS: Lower chest: Heart is enlarged. Distal esophagus is fluid-filled. Emphysema noted in the lung bases. Hepatobiliary: No suspicious focal abnormality within the liver  parenchyma. Gallbladder nondistended. No intrahepatic or extrahepatic biliary dilation. Pancreas: Pancreatic parenchyma diffusely atrophic. No dilatation of the main pancreatic duct. Spleen: No splenomegaly. No focal mass lesion. Adrenals/Urinary Tract: No adrenal nodule or mass. Kidneys unremarkable. No evidence for hydroureter. The urinary bladder appears normal for the degree of distention. Stomach/Bowel: Stomach is markedly distended and fluid-filled. Duodenum is normally positioned as is the ligament of Treitz. Proximal small bowel loops are fluid-filled and distended up to 2.8 cm diameter. No small bowel wall thickening or small bowel pneumatosis. Terminal ileum is nondistended but fluid is visible in the lumen. Colon is decompressed throughout. Diverticular changes noted left colon without definite diverticulitis. Vascular/Lymphatic: There is abdominal aortic atherosclerosis without aneurysm. Portal vein and superior mesenteric vein are patent. Celiac axis, SMA, and IMA are patent. There is no gastrohepatic or hepatoduodenal ligament lymphadenopathy. No intraperitoneal or retroperitoneal lymphadenopathy. No pelvic sidewall lymphadenopathy. Reproductive: The prostate gland is apparently surgically absent. Calcification noted in the base of the penis, stable. Other: Trace free fluid noted in the pelvis and anterior to the hepatic dome. Musculoskeletal: Right lower quadrant surgical change likely related to prior herniorrhaphy No worrisome lytic or sclerotic osseous abnormality. IMPRESSION: 1. Markedly distended fluid-filled stomach with mildly distended fluid-filled small bowel. No evidence for small bowel wall thickening or pneumatosis. No abrupt transition zone. Terminal ileum is nondistended but is fluid-filled. Imaging features may reflect marked adynamic ileus or evolving small bowel obstruction. 2. Fluid in the distal esophagus, likely reflux. 3. Trace free fluid in the pelvis and anterior to the hepatic  dome. 4. Status post prostatectomy. 5. Left colonic diverticulosis without diverticulitis. 6. Aortic Atherosclerosis (ICD10-I70.0) and Emphysema (ICD10-J43.9). Electronically Signed   By: Misty Stanley M.D.   On: 02/21/2019 14:57   Dg Abd Portable 1v  Result Date: 02/22/2019 CLINICAL DATA:  Small-bowel obstruction EXAM: PORTABLE ABDOMEN - 1 VIEW COMPARISON:  CT abdomen and pelvis 02/21/2019 FINDINGS: Normal bowel gas pattern. No bowel dilatation or bowel wall thickening. Stool present in rectum. Stomach decompressed versus prior CT. Excreted contrast present within urinary bladder. Bones demineralized with degenerative changes and scoliosis of lumbar spine. Scattered atherosclerotic calcifications. Surgical clips RIGHT pelvis. IMPRESSION: Normal bowel gas pattern. Electronically Signed   By: Lavonia Dana M.D.   On: 02/22/2019 09:33        Scheduled Meds: . docusate sodium  100 mg Oral BID  . enoxaparin (LOVENOX) injection  1 mg/kg Subcutaneous Q12H  . Lifitegrast  1 drop Both Eyes BID  . nystatin cream  1 application Topical BID  . polyethylene glycol  17 g Oral Daily   Continuous Infusions: . sodium chloride 100 mL/hr at 02/21/19 2202     LOS: 1 day    Yaakov Guthrie, MD  [facility] Pager on [place]  If 7PM-7AM, please contact night-coverage [email or web address] Password [email or web address] 02/22/2019, 1:00 PM

## 2019-02-22 NOTE — Progress Notes (Signed)
Stitches removed from right lower leg. Donne Hazel, RN

## 2019-02-23 ENCOUNTER — Ambulatory Visit: Payer: Medicare Other | Admitting: Adult Health

## 2019-02-23 LAB — BASIC METABOLIC PANEL
Anion gap: 8 (ref 5–15)
BUN: 16 mg/dL (ref 8–23)
CO2: 25 mmol/L (ref 22–32)
Calcium: 9 mg/dL (ref 8.9–10.3)
Chloride: 108 mmol/L (ref 98–111)
Creatinine, Ser: 0.86 mg/dL (ref 0.61–1.24)
GFR calc Af Amer: >60 mL/min (ref >60)
GFR calc non Af Amer: >60 mL/min (ref >60)
Glucose, Bld: 97 mg/dL (ref 70–99)
Potassium: 3.9 mmol/L (ref 3.5–5.1)
Sodium: 141 mmol/L (ref 135–145)

## 2019-02-23 LAB — CBC
HCT: 40.4 % (ref 39.0–52.0)
Hemoglobin: 12.7 g/dL — ABNORMAL LOW (ref 13.0–17.0)
MCH: 32.6 pg (ref 26.0–34.0)
MCHC: 31.4 g/dL (ref 30.0–36.0)
MCV: 103.6 fL — ABNORMAL HIGH (ref 80.0–100.0)
Platelets: 169 10*3/uL (ref 150–400)
RBC: 3.9 MIL/uL — ABNORMAL LOW (ref 4.22–5.81)
RDW: 13.8 % (ref 11.5–15.5)
WBC: 7.4 10*3/uL (ref 4.0–10.5)
nRBC: 0 % (ref 0.0–0.2)

## 2019-02-23 MED ORDER — MENTHOL 3 MG MT LOZG
1.0000 | LOZENGE | OROMUCOSAL | Status: DC | PRN
  Administered 2019-02-23: 3 mg via ORAL
  Filled 2019-02-23: qty 9

## 2019-02-23 NOTE — Progress Notes (Signed)
Patient has refused his medications.

## 2019-02-23 NOTE — Evaluation (Signed)
Physical Therapy Evaluation Patient Details Name: Jeremy Kelley MRN: [id number] DOB: 10-22-1926 Today's Date: 02/23/2019   History of Present Illness  85yo male c/o new abdominal pain and nausea, vomiting. Admitted for these symptoms as well as possible developing SBO. PMH HLD, HTN, prostate CA, cardiac cath, A-fib, mitral regurg  Clinical Impression   Patient received in chair, pleasant and willing to work with therapy today. Able to complete functional transfers with min guard, and gait approximately 564f with U UE support on IV pole. Initially unsteady on feet but this improved with ongoing mobility, does require at least U UE support for dynamic situations. Gait mechanics and balance improved significantly with ongoing mobility today. Educated him to kindly use RW until HHPT is able to improve strength/balance. He was left up in the chair with all needs met, chair alarm active. Currently recommending skilled HHPT services with intermittent S from friends/family moving forward.     Follow Up Recommendations Home health PT;Supervision - Intermittent    Equipment Recommendations  Rolling walker with 5" wheels    Recommendations for Other Services       Precautions / Restrictions Precautions Precautions: Fall Restrictions Weight Bearing Restrictions: No      Mobility  Bed Mobility               General bed mobility comments: OOB in chair  Transfers Overall transfer level: Needs assistance Equipment used: None Transfers: Sit to/from Stand Sit to Stand: Min guard         General transfer comment: min guard for safety  Ambulation/Gait Ambulation/Gait assistance: Min guard Gait Distance (Feet): 500 Feet Assistive device: IV Pole Gait Pattern/deviations: Step-through pattern;Drifts right/left Gait velocity: initially decreased then WNL   General Gait Details: gait initially slow and hesitant, improved as patient continued to walk, benefits from support of IV  pole  Stairs            Wheelchair Mobility    Modified Rankin (Stroke Patients Only)       Balance Overall balance assessment: Needs assistance Sitting-balance support: No upper extremity supported;Feet supported Sitting balance-Leahy Scale: Normal     Standing balance support: No upper extremity supported;During functional activity Standing balance-Leahy Scale: Fair Standing balance comment: posterior bias initially after standing up, able to self correct with increased time, needs at least U UE support in dynamic situations                             Pertinent Vitals/Pain Pain Assessment: 0-10 Pain Score: 4  Pain Location: throat with swallowing Pain Descriptors / Indicators: Sore Pain Intervention(s): Limited activity within patient's tolerance;Monitored during session    Home Living Family/patient expects to be discharged to:: Private residence Living Arrangements: Alone Available Help at Discharge: Family;Friend(s);Available PRN/intermittently Type of Home: House(townhome) Home Access: Stairs to enter Entrance Stairs-Rails: Right Entrance Stairs-Number of Steps: 8 Home Layout: One level Home Equipment: Walker - 2 wheels;Other (comment)(walking stick)      Prior Function Level of Independence: Independent with assistive device(s)         Comments: no device in home, walking stick in community     Hand Dominance        Extremity/Trunk Assessment   Upper Extremity Assessment Upper Extremity Assessment: Generalized weakness    Lower Extremity Assessment Lower Extremity Assessment: Generalized weakness    Cervical / Trunk Assessment Cervical / Trunk Assessment: Kyphotic  Communication   Communication: HOH  Cognition Arousal/Alertness:  Awake/alert Behavior During Therapy: WFL for tasks assessed/performed Overall Cognitive Status: Within Functional Limits for tasks assessed                                 General  Comments: mild intermittent confusion but functional- when outside room patient states "my room is there yes, but I'm here"      General Comments      Exercises     Assessment/Plan    PT Assessment Patient needs continued PT services  PT Problem List Decreased strength;Decreased safety awareness;Decreased balance       PT Treatment Interventions DME instruction;Balance training;Gait training;Neuromuscular re-education;Stair training;Functional mobility training;Patient/family education;Therapeutic activities;Therapeutic exercise    PT Goals (Current goals can be found in the Care Plan section)  Acute Rehab PT Goals Patient Stated Goal: go home PT Goal Formulation: With patient Time For Goal Achievement: 03/09/19 Potential to Achieve Goals: Good    Frequency Min 3X/week   Barriers to discharge        Co-evaluation               AM-PAC PT "6 Clicks" Mobility  Outcome Measure Help needed turning from your back to your side while in a flat bed without using bedrails?: None Help needed moving from lying on your back to sitting on the side of a flat bed without using bedrails?: A Little Help needed moving to and from a bed to a chair (including a wheelchair)?: A Little Help needed standing up from a chair using your arms (e.g., wheelchair or bedside chair)?: A Little Help needed to walk in hospital room?: A Little Help needed climbing 3-5 steps with a railing? : A Little 6 Click Score: 19    End of Session Equipment Utilized During Treatment: Gait belt Activity Tolerance: Patient tolerated treatment well Patient left: in chair;with call bell/phone within reach;with chair alarm set   PT Visit Diagnosis: Unsteadiness on feet (R26.81);Muscle weakness (generalized) (M62.81)    Time: 6387-5643 PT Time Calculation (min) (ACUTE ONLY): 21 min   Charges:   PT Evaluation $PT Eval Low Complexity: 1 Low          Windell Norfolk, DPT, CBIS  Supplemental Physical  Therapist [place]    Pager [phone] Acute Rehab Office [phone]

## 2019-02-23 NOTE — Progress Notes (Signed)
Patient has refused IV placement. Patient was educated and all concerns were addressed.

## 2019-02-23 NOTE — Progress Notes (Signed)
PT Cancellation Note  Patient Details Name: Jeremy Kelley MRN: [id number] DOB: 1926/05/02   Cancelled Treatment:    Reason Eval/Treat Not Completed: Patient declined, no reason specified patient declines PT stating "I need to get cleaned up". Willing to participate after doing so. Will return later in AM.    Ann Lions PT, DPT, CBIS  Supplemental Physical Therapist [facility]    Pager [phone] Acute Rehab Office [phone]

## 2019-02-23 NOTE — Progress Notes (Signed)
Patient has refused CBG check and vital signs per NT.

## 2019-02-23 NOTE — Progress Notes (Signed)
Pt stated his throat feels better and he does not believe he needs the swallow eval.  He is worried that he will not be discharged tomorrow as he has ride arrangements made and an appointment made for Monday.

## 2019-02-23 NOTE — Progress Notes (Signed)
PROGRESS NOTE    Jeremy Kelley  [id number] DOB: 07-25-1926 DOA: 02/21/2019 PCP: [name], NP   Brief Narrative:  Jeremy Kelley a 85 y.o.malewith medical history significant ofHTN, HLD, Carotid artery stenosis, Chronic atrial fibrillation on apixaban, AI/MR, Hx of prostate cancer, urothelial carcinoma, and remote inguinal hernia repair presented with new abdominal pain, nausea/vomiting. Imaging concerning for developing SBO.  He is continuing to complain of abdominal pain.  He is refusing NG tube placement.  Patient was evaluated by general surgery reports there is no bowel obstruction patient can be discharged.  The patient complains of difficulty swallowing,  speech evaluation ordered.  PT evaluation ordered.   Assessment & Plan:   Active Problems:   Small bowel obstruction (HCC)  # Abdominal pain/N/V 2/2 ileus and possible developing SBO - symptoms and currently with absence of flatus and BM; CT showed markedly distended stomach, fluid-filled. Proximal small bowel loops are fluid-filled and distended up to 2/8 cm in diameter. Terminal ileum non-distended. No abrupt transition zone noted. - attempted NGT decompression.   Patient refused.   -  IVF and monitor electrolytes, clear liquid diet and advance as tolerated. - avoid opiates and other medications which may slow bowel, contribute to ileus -Consulted surgery.  Bowel obstruction resolved,  patient can be discharged.  # Leukocytosis -Likely reactive?  No evidence of infectious etiology at this time.  Continue to monitor.  If worsening WBC count or fever and consider starting empiric antimicrobials.  # Chronic atrial fibrillation -On metoprolol at home. . -Heart rate at this time stable.  However, if heart rate is increasing or widely fluctuant consider IV metoprolol.  # Aortic insufficiency, Mitral regurg # Pulmonary HTN - held oral agents at present given the SBO, patient also not volume overloaded at  present - switched Apixaban to Lovenox - if diuresis to be resumed, can start home dose.  # HLD - held statin  # hx of Prostate Ca s/p prostatectomy # Hx of urothelia ca - no acute issues  # Malnutrition - patient with recent stable weights, BMI of 19.37   DVT prophylaxis:transitioned Apixaban to therapeutic lovenox, oral meds held due to concern for developing SBO Code Status:DNR/DNI Disposition Plan:anticipated DC tomorrow.   Consultants:   General surgery  Procedures: None    Antimicrobials: ( Anti-infectives (From admission, onward)   None      Subjective: Patient is seen and examined at bedside,  he denies any abdominal pain but complains of having difficulty swallowing.  Speech evaluation ordered.  Objective: Vitals:   02/22/19 1353 02/22/19 2122 02/23/19 0539 02/23/19 1352  BP: (!) 109/51 (!) 141/71 133/69 119/66  Pulse: (!) 55 (!) 47 75 73  Resp:  16 12   Temp: 97.7 F (36.5 C) (!) 97.5 F (36.4 C) 97.7 F (36.5 C) (!) 97.5 F (36.4 C)  TempSrc: Oral Oral Oral Oral  SpO2: 98% 98% 93%   Weight:      Height:        Intake/Output Summary (Last 24 hours) at 02/23/2019 1811 Last data filed at 02/23/2019 1808 Gross per 24 hour  Intake 2982.88 ml  Output 300 ml  Net 2682.88 ml   Filed Weights   02/21/19 1040  Weight: 61.2 kg    Examination:  General exam: Appears calm and comfortable  Respiratory system: Clear to auscultation. Respiratory effort normal. Cardiovascular system: S1 & S2 heard, RRR. No JVD, murmurs, rubs, gallops or clicks. No pedal edema. Gastrointestinal system: Abdomen is nondistended, soft and  nontender. No organomegaly or masses felt. Normal bowel sounds heard. Central nervous system: Alert and oriented. No focal neurological deficits. Extremities: Symmetric 5 x 5 power. Skin: No rashes, lesions or ulcers Psychiatry: Judgement and insight appear normal. Mood & affect appropriate.   Data Reviewed: I have personally  reviewed following labs and imaging studies  CBC: Recent Labs  Lab 02/21/19 1100 02/22/19 0251 02/23/19 0350  WBC 15.8* 14.1* 7.4  HGB 14.2 13.3 12.7*  HCT 44.6 42.4 40.4  MCV 101.4* 101.4* 103.6*  PLT 225 199 [id number]   Basic Metabolic Panel: Recent Labs  Lab 02/21/19 1100 02/22/19 0251 02/23/19 0350  NA 138 135 141  K 4.2 4.0 3.9  CL 101 99 108  CO2 28 26 25   GLUCOSE 138* 110* 97  BUN 23 21 16   CREATININE 1.13 1.00 0.86  CALCIUM 9.8 8.9 9.0   GFR: Estimated Creatinine Clearance: 49.4 mL/min (by C-G formula based on SCr of 0.86 mg/dL). Liver Function Tests: Recent Labs  Lab 02/21/19 1100  AST 31  ALT 17  ALKPHOS 79  BILITOT 1.6*  PROT 7.0  ALBUMIN 4.0   Recent Labs  Lab 02/21/19 1100  LIPASE 26   No results for input(s): AMMONIA in the last 168 hours. Coagulation Profile: No results for input(s): INR, PROTIME in the last 168 hours. Cardiac Enzymes: No results for input(s): CKTOTAL, CKMB, CKMBINDEX, TROPONINI in the last 168 hours. BNP (last 3 results) Recent Labs    08/31/18 1247  PROBNP 459.0*   HbA1C: No results for input(s): HGBA1C in the last 72 hours. CBG: Recent Labs  Lab 02/21/19 2243  GLUCAP 99   Lipid Profile: No results for input(s): CHOL, HDL, LDLCALC, TRIG, CHOLHDL, LDLDIRECT in the last 72 hours. Thyroid Function Tests: No results for input(s): TSH, T4TOTAL, FREET4, T3FREE, THYROIDAB in the last 72 hours. Anemia Panel: No results for input(s): VITAMINB12, FOLATE, FERRITIN, TIBC, IRON, RETICCTPCT in the last 72 hours. Sepsis Labs: Recent Labs  Lab 02/21/19 1344  LATICACIDVEN 1.4    Recent Results (from the past 240 hour(s))  Urine culture     Status: None   Collection Time: 02/21/19 11:16 AM   Specimen: Urine, Clean Catch  Result Value Ref Range Status   Specimen Description   Final    URINE, CLEAN CATCH Performed at [facility], [place] [address], [place], [place] [address]    Special Requests   Final     NONE Performed at [facility], [place] [address] [place], [place] [address]    Culture   Final    NO GROWTH Performed at [facility], [place] [address] [place], [place] [address]    Report Status 02/22/2019 FINAL  Final  SARS CORONAVIRUS 2 (TAT 6-24 HRS) Nasopharyngeal Nasopharyngeal Swab     Status: None   Collection Time: 02/21/19  3:03 PM   Specimen: Nasopharyngeal Swab  Result Value Ref Range Status   SARS Coronavirus 2 NEGATIVE NEGATIVE Final    Comment: (NOTE) SARS-CoV-2 target nucleic acids are NOT DETECTED. The SARS-CoV-2 RNA is generally detectable in upper and lower respiratory specimens during the acute phase of infection. Negative results do not preclude SARS-CoV-2 infection, do not rule out co-infections with other pathogens, and should not be used as the sole basis for treatment or other patient management decisions. Negative results must be combined with clinical observations, patient history, and epidemiological information. The expected result is Negative. Fact Sheet for Patients: SugarRoll.be Fact Sheet for Healthcare Providers:  https://www.woods-mathews.com/ This test is not yet approved or cleared by the [place] and  has been authorized for detection and/or diagnosis of SARS-CoV-2 by FDA under an Emergency Use Authorization (EUA). This EUA will remain  in effect (meaning this test can be used) for the duration of the COVID-19 declaration under Section 56 4(b)(1) of the Act, 21 U.S.C. section 360bbb-3(b)(1), unless the authorization is terminated or revoked sooner. Performed at [facility], [place] [address] [place], [place] [address]       Radiology Studies: Dg Abd Portable 1v  Result Date: 02/22/2019 CLINICAL DATA:  Small-bowel obstruction EXAM: PORTABLE ABDOMEN - 1 VIEW COMPARISON:  CT abdomen and pelvis 02/21/2019 FINDINGS: Normal bowel gas pattern. No bowel dilatation or  bowel wall thickening. Stool present in rectum. Stomach decompressed versus prior CT. Excreted contrast present within urinary bladder. Bones demineralized with degenerative changes and scoliosis of lumbar spine. Scattered atherosclerotic calcifications. Surgical clips RIGHT pelvis. IMPRESSION: Normal bowel gas pattern. Electronically Signed   By: Lavonia Dana M.D.   On: 02/22/2019 09:33    Scheduled Meds: . docusate sodium  100 mg Oral BID  . enoxaparin (LOVENOX) injection  1 mg/kg Subcutaneous Q12H  . Lifitegrast  1 drop Both Eyes BID  . nystatin cream  1 application Topical BID  . polyethylene glycol  17 g Oral Daily   Continuous Infusions: . sodium chloride 100 mL/hr at 02/23/19 1730     LOS: 2 days    Time spent:     Shawna Clamp, MD [facility]   If 7PM-7AM, please contact night-coverage

## 2019-02-23 NOTE — Progress Notes (Signed)
CC: Abdominal pain nausea and vomiting  Subjective: His abdomen is soft he reports a bowel movement yesterday after the enema.  Nurses confirm this occurred.  He is only been up to the chair is not been walking and reports he lives alone.  His abdomen is soft and not distended, he appears to be tolerating clears well.   Objective: Vital signs in last 24 hours: Temp:  [97.5 F (36.4 C)-97.7 F (36.5 C)] 97.7 F (36.5 C) (11/13 0539) Pulse Rate:  [47-75] 75 (11/13 0539) Resp:  [12-16] 12 (11/13 0539) BP: (109-141)/(51-71) 133/69 (11/13 0539) SpO2:  [93 %-98 %] 93 % (11/13 0539) Last BM Date: 02/22/19 900 p.o. 2184 IV 300 urine recorded No stool recorded Afebrile vital signs are stable WBC 7.4, H/H 12.7/40.4 K+ 3.9 Abdominal film yesterday showed no bowel dilatation or bowel wall thickening, stool in the rectum, stomach was decompressed excreted contrast was in the urinary bladder.   Intake/Output from previous day: 11/12 0701 - 11/13 0700 In: 3084.6 [P.O.:900; I.V.:2184.6] Out: 300 [Urine:300] Intake/Output this shift: No intake/output data recorded.  General appearance: alert, cooperative and no distress GI: soft, non-tender; bowel sounds normal; no masses,  no organomegaly  Lab Results:  Recent Labs    02/22/19 0251 02/23/19 0350  WBC 14.1* 7.4  HGB 13.3 12.7*  HCT 42.4 40.4  PLT 199 169    BMET Recent Labs    02/22/19 0251 02/23/19 0350  NA 135 141  K 4.0 3.9  CL 99 108  CO2 26 25  GLUCOSE 110* 97  BUN 21 16  CREATININE 1.00 0.86  CALCIUM 8.9 9.0   PT/INR No results for input(s): LABPROT, INR in the last 72 hours.  Recent Labs  Lab 02/21/19 1100  AST 31  ALT 17  ALKPHOS 79  BILITOT 1.6*  PROT 7.0  ALBUMIN 4.0     Lipase     Component Value Date/Time   LIPASE 26 02/21/2019 1100   Prior to Admission medications   Medication Sig Start Date End Date Taking? Authorizing Provider  B Complex-C (B-COMPLEX WITH VITAMIN C) tablet Take 1  tablet by mouth daily. Reported on 07/24/2015 08/18/16  Yes Nafziger, [name], NP  cholecalciferol (VITAMIN D) 1000 units tablet Take 1 tablet (1,000 Units total) by mouth daily. 08/18/16  Yes Nafziger, [name], NP  ELIQUIS 2.5 MG TABS tablet TAKE 1 TABLET BY MOUTH TWICE A DAY 01/03/19  Yes Nafziger, [name], NP  KLOR-CON M10 10 MEQ tablet Take 1 tablet (10 mEq total) by mouth 2 (two) times daily. Patient taking differently: Take 10 mEq by mouth daily.  09/07/18  Yes Nahser, [name], MD  metoprolol succinate (TOPROL-XL) 25 MG 24 hr tablet TAKE 1 TABLET BY MOUTH EVERYDAY AT BEDTIME Patient taking differently: Take 25 mg by mouth daily.  12/20/18  Yes Nafziger, [name], NP  mirabegron ER (MYRBETRIQ) 50 MG TB24 tablet Take 1 tablet (50 mg total) by mouth daily. 12/14/18  Yes Nafziger, [name], NP  polyethylene glycol (MIRALAX / GLYCOLAX) 17 g packet Take 17 g by mouth daily as needed for mild constipation.    Yes [provider]  pravastatin (PRAVACHOL) 20 MG tablet TAKE 1 TABLET BY MOUTH EVERYDAY AT BEDTIME Patient taking differently: Take 20 mg by mouth daily.  01/23/19  Yes Nafziger, [name], NP  torsemide (DEMADEX) 20 MG tablet Take 20 mg by mouth daily.   Yes [provider]  XIIDRA 5 % SOLN Place 1 drop into both eyes 2 (two) times  a day. 08/22/18  Yes [provider]      Medications: . docusate sodium  100 mg Oral BID  . enoxaparin (LOVENOX) injection  1 mg/kg Subcutaneous Q12H  . Lifitegrast  1 drop Both Eyes BID  . nystatin cream  1 application Topical BID  . polyethylene glycol  17 g Oral Daily   . sodium chloride 100 mL/hr at 02/21/19 2202    Assessment/Plan HTN HLD Hx of prostate cancer s/p laparoscopic prostatectomy in 2001 Hx of bladder cancer s/p TURBT Dysphagia - followed by ENT and GI for this   Chronic constipation SBO vs ileus - CT 11/11: distended fluid-filled stomach with mildly distended fluid filled small bowel, possible adynamic ileus vs evolving sbo - patient  abdominal exam fairly benign this AM - repeat film - appears to be a large stool burden in rectum on CT, pt reports hx of constipation - pt would like to avoid NGT if able due to chronic dysphagia related to anatomic difference - pt may need insertion of NGT in radiology if stomach still markedly dilated - discussed possibility of surgical intervention with patient who expressed that he would want surgery if needed - no emergent surgical intervention indicated at this time - mobilize as able -Bowel regime: Colace 100 mg twice daily, MiraLAX 17 g x 1; fleets enema x1   FEN: Clear liquids, IVF VTE: SCDs, lovenox ID: no current abx   Plan: He does not have a small bowel obstruction.  Abdomen soft and nondistended.  I would recommend PT consult mobilization, advance his diet, work on bowel regime for his chronic constipation.  There is no surgical issue currently.  Please call if we can be of further assistance.  LOS: 2 days    Tamecia Mcdougald 02/23/2019 Please see Amion

## 2019-02-24 MED ORDER — DOCUSATE SODIUM 100 MG PO CAPS: 100.0000 mg | ORAL_CAPSULE | Freq: Two times a day (BID) | ORAL | 0 refills | Status: DC

## 2019-02-24 NOTE — Progress Notes (Signed)
SLP Cancellation Note  Patient Details Name: Jeremy Kelley MRN: [id number] DOB: 19-Aug-1926   Cancelled treatment:       Reason Eval/Treat Not Completed: Pt has a long history of mild dysphagia, which he is aware of. He had an OP MBS this year, ordered by Dr. Renaee Munda that revealed mild, stable dysphagia. SLP discussed this history with the pt and he was able to verbalize awareness of deficits and appropriate strategies. SLP reiterated strategies and precautions with pills, which was the problem he had during admission.  He has not had any negative consequences from this problem (ex: severe choking/aspiration pna). No further acute w/u needed at this time. Pt is aware that if his problem worsens he should discuss with his GI MD.   Jeremy Baltimore, MA [place] Pager [phone] Office [phone]  Lynann Beaver 02/24/2019, 7:55 AM

## 2019-02-24 NOTE — Discharge Summary (Signed)
Physician Discharge Summary  [name] [id number] DOB: 22-Oct-1926 DOA: 02/21/2019  PCP: [name], NP  Admit date: 02/21/2019 Discharge date: 02/24/2019  Admitted From: Home.  Disposition:  Home.  Recommendations for Outpatient Follow-up:  1. Follow up with PCP in 1-2 weeks 2. Please obtain BMP/CBC in one week 3. Please follow up GI for difficulty swallowing. Patient reports having appointment in 2 weeks.  Home Health:  No  Equipment/Devices: None  Discharge Condition: Stable.  CODE STATUS:DNR   Diet recommendation: Heart Healthy / Carb Modified / Regular / Dysphagia   Brief/Interim Summary: Jeremy Kelley a 85 y.o.malewith medical history significant ofHTN, HLD, Carotid artery stenosis, Chronic atrial fibrillation on apixaban, AI/MR, Hx of prostate cancer, urothelial carcinoma, and remote inguinal hernia repair presentedwith new abdominal pain, nausea/vomiting. Imaging concerningfor developing SBO. He was admitted for partial small bowel obstruction , He was kept  NPO ,  patient refused NG tube placement.  He was evaluated by general surgery and was continued on supportive care,  did not require surgical intervention.  He continued to complain of abdominal pain. Patient was evaluated by general surgery next day,  reports there is no bowel obstruction.  Patient had normal bowel movement reports improvement in abdominal pain, tolerated full liquid diet . There was leukocytosis likely reactive,  patient remained afebrile. He was continued on his home medication metoprolol for A. Fib, Eliquis was held but resumed on discharge. He will follow-up with urology for prostate cancer.  He will follow-up with his primary care physician.   Discharge Diagnoses:  Principal Problem:   Small bowel obstruction [facility])  Discharge Instructions  Discharge Instructions    Call MD for:  difficulty breathing, headache or visual disturbances   Complete by: As directed    Call  MD for:  extreme fatigue   Complete by: As directed    Call MD for:  persistant dizziness or light-headedness   Complete by: As directed    Call MD for:  persistant nausea and vomiting   Complete by: As directed    Call MD for:  redness, tenderness, or signs of infection (pain, swelling, redness, odor or green/yellow discharge around incision site)   Complete by: As directed    Call MD for:  severe uncontrolled pain   Complete by: As directed    Call MD for:  temperature >100.4   Complete by: As directed    Diet - low sodium heart healthy   Complete by: As directed    Discharge instructions   Complete by: As directed    Follow up [place] 1 week.,  Resume home meds.   Increase activity slowly   Complete by: As directed      Allergies as of 02/24/2019   No Known Allergies     Medication List    STOP taking these medications   Klor-Con M10 10 MEQ tablet Generic drug: potassium chloride     TAKE these medications   B-complex with vitamin C tablet Take 1 tablet by mouth daily. Reported on 07/24/2015   cholecalciferol 1000 units tablet Commonly known as: VITAMIN D Take 1 tablet (1,000 Units total) by mouth daily.   docusate sodium 100 MG capsule Commonly known as: COLACE Take 1 capsule (100 mg total) by mouth 2 (two) times daily.   Eliquis 2.5 MG Tabs tablet Generic drug: apixaban TAKE 1 TABLET BY MOUTH TWICE A DAY   metoprolol succinate 25 MG 24 hr tablet Commonly known as: TOPROL-XL TAKE 1 TABLET BY MOUTH  EVERYDAY AT BEDTIME What changed: See the new instructions.   mirabegron ER 50 MG Tb24 tablet Commonly known as: Myrbetriq Take 1 tablet (50 mg total) by mouth daily.   polyethylene glycol 17 g packet Commonly known as: MIRALAX / GLYCOLAX Take 17 g by mouth daily as needed for mild constipation.   pravastatin 20 MG tablet Commonly known as: PRAVACHOL TAKE 1 TABLET BY MOUTH EVERYDAY AT BEDTIME What changed: See the new instructions.   torsemide 20 MG  tablet Commonly known as: DEMADEX Take 20 mg by mouth daily.   Xiidra 5 % Soln Generic drug: Lifitegrast Place 1 drop into both eyes 2 (two) times a day.      Follow-up Information    Nafziger, [name], NP Follow up.   Specialty: Family Medicine Why: Ralene Ok up PCP in 1 week.  Contact information: [place] [address] [phone]        Nahser, [name], MD .   Specialty: Cardiology Contact information: [address] [place] [address] [phone]          No Known Allergies  Consultations: General Surgery  Procedures/Studies: Ct Abdomen Pelvis W Contrast  Result Date: 02/21/2019 CLINICAL DATA:  Nausea vomiting abdominal pain. EXAM: CT ABDOMEN AND PELVIS WITH CONTRAST TECHNIQUE: Multidetector CT imaging of the abdomen and pelvis was performed using the standard protocol following bolus administration of intravenous contrast. CONTRAST:  188mL OMNIPAQUE IOHEXOL 300 MG/ML  SOLN COMPARISON:  04/27/2018 FINDINGS: Lower chest: Heart is enlarged. Distal esophagus is fluid-filled. Emphysema noted in the lung bases. Hepatobiliary: No suspicious focal abnormality within the liver parenchyma. Gallbladder nondistended. No intrahepatic or extrahepatic biliary dilation. Pancreas: Pancreatic parenchyma diffusely atrophic. No dilatation of the main pancreatic duct. Spleen: No splenomegaly. No focal mass lesion. Adrenals/Urinary Tract: No adrenal nodule or mass. Kidneys unremarkable. No evidence for hydroureter. The urinary bladder appears normal for the degree of distention. Stomach/Bowel: Stomach is markedly distended and fluid-filled. Duodenum is normally positioned as is the ligament of Treitz. Proximal small bowel loops are fluid-filled and distended up to 2.8 cm diameter. No small bowel wall thickening or small bowel pneumatosis. Terminal ileum is nondistended but fluid is visible in the lumen. Colon is decompressed throughout. Diverticular  changes noted left colon without definite diverticulitis. Vascular/Lymphatic: There is abdominal aortic atherosclerosis without aneurysm. Portal vein and superior mesenteric vein are patent. Celiac axis, SMA, and IMA are patent. There is no gastrohepatic or hepatoduodenal ligament lymphadenopathy. No intraperitoneal or retroperitoneal lymphadenopathy. No pelvic sidewall lymphadenopathy. Reproductive: The prostate gland is apparently surgically absent. Calcification noted in the base of the penis, stable. Other: Trace free fluid noted in the pelvis and anterior to the hepatic dome. Musculoskeletal: Right lower quadrant surgical change likely related to prior herniorrhaphy No worrisome lytic or sclerotic osseous abnormality. IMPRESSION: 1. Markedly distended fluid-filled stomach with mildly distended fluid-filled small bowel. No evidence for small bowel wall thickening or pneumatosis. No abrupt transition zone. Terminal ileum is nondistended but is fluid-filled. Imaging features may reflect marked adynamic ileus or evolving small bowel obstruction. 2. Fluid in the distal esophagus, likely reflux. 3. Trace free fluid in the pelvis and anterior to the hepatic dome. 4. Status post prostatectomy. 5. Left colonic diverticulosis without diverticulitis. 6. Aortic Atherosclerosis (ICD10-I70.0) and Emphysema (ICD10-J43.9). Electronically Signed   By: Misty Stanley M.D.   On: 02/21/2019 14:57   Dg Abd Portable 1v  Result Date: 02/22/2019 CLINICAL DATA:  Small-bowel obstruction EXAM: PORTABLE ABDOMEN - 1 VIEW COMPARISON:  CT abdomen and pelvis 02/21/2019 FINDINGS: Normal bowel gas pattern. No bowel dilatation or bowel wall thickening. Stool present in rectum. Stomach decompressed versus prior CT. Excreted contrast present within urinary bladder. Bones demineralized with degenerative changes and scoliosis of lumbar spine. Scattered atherosclerotic calcifications. Surgical clips RIGHT pelvis. IMPRESSION: Normal bowel gas  pattern. Electronically Signed   By: Lavonia Dana M.D.   On: 02/22/2019 09:33      Subjective: Patient was seen and examined at bedside,  He denies any abdominal pain,  nausea and vomiting.  He is able to ambulate by self.   He reports he has called his friends to pick him up today.  Discharge Exam: Vitals:   02/23/19 0539 02/23/19 1352  BP: 133/69 119/66  Pulse: 75 73  Resp: 12   Temp: 97.7 F (36.5 C) (!) 97.5 F (36.4 C)  SpO2: 93%    Vitals:   02/22/19 2122 02/23/19 0539 02/23/19 1352 02/24/19 0607  BP: (!) 141/71 133/69 119/66   Pulse: (!) 47 75 73   Resp: 16 12    Temp:  97.7 F (36.5 C) (!) 97.5 F (36.4 C)   TempSrc: Oral Oral Oral   SpO2: 98% 93%    Weight:    60.8 kg  Height:        General: Pt is alert, awake, not in acute distress Cardiovascular: RRR, S1/S2 +, no rubs, no gallops Respiratory: CTA bilaterally, no wheezing, no rhonchi Abdominal: Soft, NT, ND, bowel sounds + Extremities: no edema, no cyanosis    The results of significant diagnostics from this hospitalization (including imaging, microbiology, ancillary and laboratory) are listed below for reference.     Microbiology: Recent Results (from the past 240 hour(s))  Urine culture     Status: None   Collection Time: 02/21/19 11:16 AM   Specimen: Urine, Clean Catch  Result Value Ref Range Status   Specimen Description   Final    URINE, CLEAN CATCH Performed at [facility], [place] [name] [place], [place] [address]    Special Requests   Final    NONE Performed at [facility], [place] [address], [place], [place] [address]    Culture   Final    NO GROWTH Performed at [facility], [place] [address], [place], [place] [address]    Report Status 02/22/2019 FINAL  Final  SARS CORONAVIRUS 2 (TAT 6-24 HRS) Nasopharyngeal Nasopharyngeal Swab     Status: None   Collection Time: 02/21/19  3:03 PM   Specimen: Nasopharyngeal Swab  Result Value Ref Range  Status   SARS Coronavirus 2 NEGATIVE NEGATIVE Final    Comment: (NOTE) SARS-CoV-2 target nucleic acids are NOT DETECTED. The SARS-CoV-2 RNA is generally detectable in upper and lower respiratory specimens during the acute phase of infection. Negative results do not preclude SARS-CoV-2 infection, do not rule out co-infections with other pathogens, and should not be used as the sole basis for treatment or other patient management decisions. Negative results must be combined with clinical observations, patient history, and epidemiological information. The expected result is Negative. Fact Sheet for Patients: SugarRoll.be Fact Sheet for Healthcare Providers: https://www.woods-mathews.com/ This test is not yet approved or cleared by the [place] FDA and  has been authorized for detection and/or diagnosis of SARS-CoV-2 by FDA under an Emergency Use Authorization (EUA). This EUA will remain  in effect (meaning this test can be used) for the duration of the COVID-19 declaration under Section 56 4(b)(1) of the  Act, 21 U.S.C. section 360bbb-3(b)(1), unless the authorization is terminated or revoked sooner. Performed at [facility], [place] [address] [place], [place] [address]      Labs: BNP (last 3 results) No results for input(s): BNP in the last 8760 hours. Basic Metabolic Panel: Recent Labs  Lab 02/21/19 1100 02/22/19 0251 02/23/19 0350  NA 138 135 141  K 4.2 4.0 3.9  CL 101 99 108  CO2 28 26 25   GLUCOSE 138* 110* 97  BUN 23 21 16   CREATININE 1.13 1.00 0.86  CALCIUM 9.8 8.9 9.0   Liver Function Tests: Recent Labs  Lab 02/21/19 1100  AST 31  ALT 17  ALKPHOS 79  BILITOT 1.6*  PROT 7.0  ALBUMIN 4.0   Recent Labs  Lab 02/21/19 1100  LIPASE 26   No results for input(s): AMMONIA in the last 168 hours. CBC: Recent Labs  Lab 02/21/19 1100 02/22/19 0251 02/23/19 0350  WBC 15.8* 14.1* 7.4  HGB 14.2 13.3 12.7*   HCT 44.6 42.4 40.4  MCV 101.4* 101.4* 103.6*  PLT 225 199 169   Cardiac Enzymes: No results for input(s): CKTOTAL, CKMB, CKMBINDEX, TROPONINI in the last 168 hours. BNP: Invalid input(s): POCBNP CBG: Recent Labs  Lab 02/21/19 2243  GLUCAP 99   D-Dimer No results for input(s): DDIMER in the last 72 hours. Hgb A1c No results for input(s): HGBA1C in the last 72 hours. Lipid Profile No results for input(s): CHOL, HDL, LDLCALC, TRIG, CHOLHDL, LDLDIRECT in the last 72 hours. Thyroid function studies No results for input(s): TSH, T4TOTAL, T3FREE, THYROIDAB in the last 72 hours.  Invalid input(s): FREET3 Anemia work up No results for input(s): VITAMINB12, FOLATE, FERRITIN, TIBC, IRON, RETICCTPCT in the last 72 hours. Urinalysis    Component Value Date/Time   COLORURINE YELLOW 02/21/2019 1100   APPEARANCEUR CLEAR 02/21/2019 1100   LABSPEC 1.015 02/21/2019 1100   PHURINE 7.0 02/21/2019 1100   GLUCOSEU NEGATIVE 02/21/2019 1100   HGBUR NEGATIVE 02/21/2019 1100   BILIRUBINUR NEGATIVE 02/21/2019 1100   BILIRUBINUR n 07/15/2015 1320   KETONESUR NEGATIVE 02/21/2019 1100   PROTEINUR NEGATIVE 02/21/2019 1100   UROBILINOGEN 0.2 07/15/2015 1320   NITRITE NEGATIVE 02/21/2019 1100   LEUKOCYTESUR NEGATIVE 02/21/2019 1100   Sepsis Labs Invalid input(s): PROCALCITONIN,  WBC,  LACTICIDVEN Microbiology Recent Results (from the past 240 hour(s))  Urine culture     Status: None   Collection Time: 02/21/19 11:16 AM   Specimen: Urine, Clean Catch  Result Value Ref Range Status   Specimen Description   Final    URINE, CLEAN CATCH Performed at [facility], [place] [address], [place], [place] [address]    Special Requests   Final    NONE Performed at [facility], [place] [address] [place], [place] [address]    Culture   Final    NO GROWTH Performed at [facility], [place] [address] [place], [place] [address]    Report Status 02/22/2019 FINAL   Final  SARS CORONAVIRUS 2 (TAT 6-24 HRS) Nasopharyngeal Nasopharyngeal Swab     Status: None   Collection Time: 02/21/19  3:03 PM   Specimen: Nasopharyngeal Swab  Result Value Ref Range Status   SARS Coronavirus 2 NEGATIVE NEGATIVE Final    Comment: (NOTE) SARS-CoV-2 target nucleic acids are NOT DETECTED. The SARS-CoV-2 RNA is generally detectable in upper and lower respiratory specimens during the acute phase of infection. Negative results do not preclude SARS-CoV-2 infection, do not rule  out co-infections with other pathogens, and should not be used as the sole basis for treatment or other patient management decisions. Negative results must be combined with clinical observations, patient history, and epidemiological information. The expected result is Negative. Fact Sheet for Patients: SugarRoll.be Fact Sheet for Healthcare Providers: https://www.woods-mathews.com/ This test is not yet approved or cleared by the [place] FDA and  has been authorized for detection and/or diagnosis of SARS-CoV-2 by FDA under an Emergency Use Authorization (EUA). This EUA will remain  in effect (meaning this test can be used) for the duration of the COVID-19 declaration under Section 56 4(b)(1) of the Act, 21 U.S.C. section 360bbb-3(b)(1), unless the authorization is terminated or revoked sooner. Performed at [facility], [place] [address] [place], [place] [address]      Time coordinating discharge: Over 30 minutes  SIGNED:   Shawna Clamp, MD  [facility] 02/24/2019, 11:48 AM Pager   If 7PM-7AM, please contact night-coverage [email or web address]

## 2019-02-24 NOTE — Plan of Care (Signed)
Assessment unchanged. Pt verbalized understanding of dc instructions through teach back. Discharged via wc to front entrance accompanied.

## 2019-02-26 ENCOUNTER — Ambulatory Visit (INDEPENDENT_AMBULATORY_CARE_PROVIDER_SITE_OTHER): Payer: Medicare Other | Admitting: Neurology

## 2019-02-26 ENCOUNTER — Other Ambulatory Visit: Payer: Self-pay

## 2019-02-26 ENCOUNTER — Encounter: Payer: Self-pay | Admitting: Neurology

## 2019-02-26 VITALS — BP 146/75 | HR 77 | Ht 69.0 in | Wt 127.1 lb

## 2019-02-26 DIAGNOSIS — R413 Other amnesia: Secondary | ICD-10-CM | POA: Diagnosis not present

## 2019-02-26 MED ORDER — DONEPEZIL HCL 5 MG PO TABS: ORAL_TABLET | ORAL | 11 refills | Status: DC

## 2019-02-26 NOTE — Patient Instructions (Signed)
1. Start Donepezil 5mg : Take 1/2 tablet daily for 2 weeks, then increase to 1 tablet daily  2. Continue to monitor home safety and driving  3. Follow-up in 6 months, call for any changes  FALL PRECAUTIONS: Be cautious when walking. Scan the area for obstacles that may increase the risk of trips and falls. When getting up in the mornings, sit up at the edge of the bed for a few minutes before getting out of bed. Consider elevating the bed at the head end to avoid drop of blood pressure when getting up. Walk always in a well-lit room (use night lights in the walls). Avoid area rugs or power cords from appliances in the middle of the walkways. Use a walker or a cane if necessary and consider physical therapy for balance exercise. Get your eyesight checked regularly.  RECOMMENDATIONS FOR ALL PATIENTS WITH MEMORY PROBLEMS: 1. Continue to exercise (Recommend 30 minutes of walking everyday, or 3 hours every week) 2. Increase social interactions - continue going to [place] and enjoy social gatherings with friends and family 3. Eat healthy, avoid fried foods and eat more fruits and vegetables 4. Maintain adequate blood pressure, blood sugar, and blood cholesterol level. Reducing the risk of stroke and cardiovascular disease also helps promoting better memory. 5. Avoid stressful situations. Live a simple life and avoid aggravations. Organize your time and prepare for the next day in anticipation. 6. Sleep well, avoid any interruptions of sleep and avoid any distractions in the bedroom that may interfere with adequate sleep quality 7. Avoid sugar, avoid sweets as there is a strong link between excessive sugar intake, diabetes, and cognitive impairment The Mediterranean diet has been shown to help patients reduce the risk of progressive memory disorders and reduces cardiovascular risk. This includes eating fish, eat fruits and green leafy vegetables, nuts like almonds and hazelnuts, walnuts, and also use olive  oil. Avoid fast foods and fried foods as much as possible. Avoid sweets and sugar as sugar use has been linked to worsening of memory function.  There is always a concern of gradual progression of memory problems. If this is the case, then we may need to adjust level of care according to patient needs. Support, both to the patient and caregiver, should then be put into place.
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
## 2019-02-27 ENCOUNTER — Encounter: Payer: Self-pay | Admitting: Neurology

## 2019-02-27 ENCOUNTER — Telehealth: Payer: Self-pay | Admitting: *Deleted

## 2019-02-27 ENCOUNTER — Encounter: Payer: Self-pay | Admitting: Adult Health

## 2019-02-27 ENCOUNTER — Ambulatory Visit (INDEPENDENT_AMBULATORY_CARE_PROVIDER_SITE_OTHER): Payer: Medicare Other | Admitting: Adult Health

## 2019-02-27 VITALS — BP 114/64 | Temp 98.0°F | Wt 124.0 lb

## 2019-02-27 DIAGNOSIS — K56609 Unspecified intestinal obstruction, unspecified as to partial versus complete obstruction: Secondary | ICD-10-CM

## 2019-02-27 DIAGNOSIS — R600 Localized edema: Secondary | ICD-10-CM | POA: Diagnosis not present

## 2019-02-27 DIAGNOSIS — K5901 Slow transit constipation: Secondary | ICD-10-CM | POA: Diagnosis not present

## 2019-02-27 LAB — BASIC METABOLIC PANEL
BUN: 19 mg/dL (ref 6–23)
CO2: 32 mEq/L (ref 19–32)
Calcium: 9.8 mg/dL (ref 8.4–10.5)
Chloride: 102 mEq/L (ref 96–112)
Creatinine, Ser: 1.06 mg/dL (ref 0.40–1.50)
GFR: 65.62 mL/min (ref >60.00)
Glucose, Bld: 92 mg/dL (ref 70–99)
Potassium: 4.1 mEq/L (ref 3.5–5.1)
Sodium: 142 mEq/L (ref 135–145)

## 2019-02-27 LAB — CBC WITH DIFFERENTIAL/PLATELET
Basophils Absolute: 0 10*3/uL (ref 0.0–0.1)
Basophils Relative: 0.4 % (ref 0.0–3.0)
Eosinophils Absolute: 0.2 10*3/uL (ref 0.0–0.7)
Eosinophils Relative: 3 % (ref 0.0–5.0)
HCT: 37.5 % — ABNORMAL LOW (ref 39.0–52.0)
Hemoglobin: 12.8 g/dL — ABNORMAL LOW (ref 13.0–17.0)
Lymphocytes Relative: 16.1 % (ref 12.0–46.0)
Lymphs Abs: 1.1 10*3/uL (ref 0.7–4.0)
MCHC: 34.1 g/dL (ref 30.0–36.0)
MCV: 95.7 fl (ref 78.0–100.0)
Monocytes Absolute: 0.4 10*3/uL (ref 0.1–1.0)
Monocytes Relative: 6.2 % (ref 3.0–12.0)
Neutro Abs: 5.2 10*3/uL (ref 1.4–7.7)
Neutrophils Relative %: 74.3 % (ref 43.0–77.0)
Platelets: 196 10*3/uL (ref 150.0–400.0)
RBC: 3.92 Mil/uL — ABNORMAL LOW (ref 4.22–5.81)
RDW: 13.9 % (ref 11.5–15.5)
WBC: 7 10*3/uL (ref 4.0–10.5)

## 2019-02-27 NOTE — Telephone Encounter (Signed)
Attempted to contact patient. No answer. LVM for patient to return call. Please transfer to Roselyn Reef to complete TCM for hospital f/u.

## 2019-02-27 NOTE — Progress Notes (Addendum)
Subjective:    Patient ID: Jeremy Kelley, male    DOB: 05-Feb-1927, 85 y.o.   MRN: [id number]  HPI 85 year old male who  has a past medical history of Arthritis, Carotid artery stenosis (11/2008), Colon polyps, Diverticulosis, Hepatitis, Hyperlipidemia, Hypertension, Numbness, Phimosis, Prostate cancer ([place]) (2001), and Urothelial carcinoma of left distal ureter ([place]).  TCM visit   Admit Date 02/21/2019 Discharge Date 02/24/2019  He presented to the emergency room with new abdominal pain, nausea and vomiting.  Imaging was concerning for developing SBO.  He was admitted for partial small bowel obstruction, he was kept n.p.o., patient refused NG tube placement.  He was evaluated by general surgery and was continued on supportive care, he did not require any surgical intervention.  He continued to complain of abdominal pain and was evaluated again by general surgery the next day, at this time there was no bowel obstruction.  Patient had a normal bowel movement and reported improvement in abdominal pain and was tolerating full liquid diet.  Was continued on home medication metoprolol for A. fib, Eliquis was held but resumed on discharge.  Today he reports that he continues to have mild left lower quadrant abdominal pain.  He did have a bowel movement this morning but it was hard and he reports "they were small pebbles".  Pain is worse when he coughs.  This generally he reports bilateral lower extremity swelling.  He denies fevers, chills, nausea, vomiting, or feeling acutely ill.  Is being discharged his diet has continued to be mostly full liquid he has had very little regular diet.   Review of Systems  Constitutional: Positive for fatigue.  Respiratory: Negative.   Cardiovascular: Negative.   Gastrointestinal: Positive for abdominal pain and constipation. Negative for abdominal distention.  Endocrine: Negative.   Genitourinary: Negative.   Musculoskeletal: Negative.   Skin: Negative.    Neurological: Negative.   Psychiatric/Behavioral: Negative.   All other systems reviewed and are negative.    Past Medical History:  Diagnosis Date  . Arthritis   . Carotid artery stenosis 11/2008   bilateral 40-59% stenosis  . Colon polyps   . Diverticulosis   . Hepatitis    doesn't know which type 1969  . Hyperlipidemia   . Hypertension   . Numbness    fingertips  . Phimosis   . Prostate cancer ([place]) 2001  . Urothelial carcinoma of left distal ureter [facility])     Social History   Socioeconomic History  . Marital status: Single    Spouse name: Not on file  . Number of children: 0  . Years of education: Not on file  . Highest education level: Not on file  Occupational History  . Occupation: Retired  Scientific laboratory technician  . Financial resource strain: Not on file  . Food insecurity    Worry: Not on file    Inability: Not on file  . Transportation needs    Medical: Not on file    Non-medical: Not on file  Tobacco Use  . Smoking status: Never Smoker  . Smokeless tobacco: Never Used  . Tobacco comment: 07/29/2015 "might smoke a cigar once/year"  Substance and Sexual Activity  . Alcohol use: Yes    Alcohol/week: 14.0 standard drinks    Types: 14 Glasses of wine per week    Comment: 07/29/2015 "big glass of wine q night"  . Drug use: No  . Sexual activity: Not on file  Lifestyle  . Physical activity    Days  per week: Not on file    Minutes per session: Not on file  . Stress: Not on file  Relationships  . Social Herbalist on phone: Not on file    Gets together: Not on file    Attends religious service: Not on file    Active member of club or organization: Not on file    Attends meetings of clubs or organizations: Not on file    Relationship status: Not on file  . Intimate partner violence    Fear of current or ex partner: Not on file    Emotionally abused: Not on file    Physically abused: Not on file    Forced sexual activity: Not on file  Other Topics  Concern  . Not on file  Social History Narrative   Patient is single, has never been married.  Does not have any children.  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   He drinks approximately 3-4 glasses of wine per week.  No history of excessive alcohol use.  Occasionally smokes a cigar.   Spends winters in [place]         Past Surgical History:  Procedure Laterality Date  . BIOPSY  10/01/2011   Procedure: BIOPSY;  Surgeon: [name], MD;  [facility];  Service: Urology;;  biopsy of bladder tumor  . BLADDER SUSPENSION  08/18/10   [name]  . CARDIAC CATHETERIZATION N/A 07/29/2015   Procedure: Right/Left Heart Cath and Coronary Angiography;  Surgeon: Peter M Martinique, MD;  Location: [place] [facility];  Service: Cardiovascular;  Laterality: N/A;  . CATARACT EXTRACTION W/ INTRAOCULAR LENS  IMPLANT, BILATERAL Bilateral   . CYSTOSCOPY W/ RETROGRADES  01/27/2012   Procedure: CYSTOSCOPY WITH RETROGRADE PYELOGRAM;  Surgeon: [name], MD;  [facility];  Service: Urology;  Laterality: Left;  . CYSTOSCOPY WITH RETROGRADE PYELOGRAM, URETEROSCOPY AND STENT PLACEMENT Left 11/24/2012   Procedure: CYSTOSCOPY WITH left RETROGRADE PYELOGRAM, bladder washings, ureteroscopy;  Surgeon: [name], MD;  [facility];  Service: Urology;  Laterality: Left;  . CYSTOSCOPY WITH RETROGRADE PYELOGRAM, URETEROSCOPY AND STENT PLACEMENT Bilateral 11/19/2013   Procedure: CYSTOSCOPY WITH BILATERAL RETROGRADE PYELOGRAM,;  Surgeon: [name], MD;  [facility];  Service: Urology;  Laterality: Bilateral;  . EYE SURGERY  09/24/10   left eye. "Retina surgery"  . INGUINAL HERNIA REPAIR  1960  . PROSTATE SURGERY     s/p laparoscopic prostate surgery  . SKIN BIOPSY  05/20/2017   Superficial and nodular basal cell carcinoma (left temple)    basal cell carcinoma with sclerosis (right temple)  . 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  SKIN CANCER EXCISION     a/p skin cancer right foot-non melanoma  . skin laceration    . TEE WITHOUT CARDIOVERSION N/A 07/29/2015   Procedure: TRANSESOPHAGEAL ECHOCARDIOGRAM (TEE);  Surgeon: Pixie Casino, MD;  Location: [facility] ENDOSCOPY;  Service: Cardiovascular;  Laterality: N/A;  cath to follow  . TRANSURETHRAL RESECTION OF BLADDER TUMOR  10/01/2011   Procedure: TRANSURETHRAL RESECTION OF BLADDER TUMOR (TURBT);  Surgeon: [name], MD;  [facility];  Service: Urology;  Laterality: N/A;  . TRANSURETHRAL RESECTION OF BLADDER TUMOR N/A 11/19/2013   Procedure: TRANSURETHRAL RESECTION OF BLADDER TUMOR (TURBT);  Surgeon: [name], MD;  [facility];  Service: Urology;  Laterality: N/A;  . URETEROSCOPY  01/27/2012   Procedure: URETEROSCOPY;  Surgeon: [name],  MD;  [facility];  Service: Urology;  Laterality: Left;  CYSTO, Left RETROGRADE PYELOGRPHY, LEFT URETEROSCOPY   . URINARY SPHINCTER IMPLANT    . VARICOSE VEIN SURGERY      Family History  Problem Relation Age of Onset  . Colon cancer Neg Hx   . Colon polyps Neg Hx     No Known Allergies  Current Outpatient Medications on File Prior to Visit  Medication Sig Dispense Refill  . B Complex-C (B-COMPLEX WITH VITAMIN C) tablet Take 1 tablet by mouth daily. Reported on 07/24/2015 90 tablet 3  . cholecalciferol (VITAMIN D) 1000 units tablet Take 1 tablet (1,000 Units total) by mouth daily. 90 tablet 3  . docusate sodium (COLACE) 100 MG capsule Take 1 capsule (100 mg total) by mouth 2 (two) times daily. 10 capsule 0  . donepezil (ARICEPT) 5 MG tablet Take 1/2 tablet daily for 2 weeks, then increase to 1 tablet daily 30 tablet 11  . ELIQUIS 2.5 MG TABS tablet TAKE 1 TABLET BY MOUTH TWICE A DAY 180 tablet 1  . metoprolol succinate (TOPROL-XL) 25 MG 24 hr tablet TAKE 1 TABLET BY MOUTH EVERYDAY AT BEDTIME (Patient taking differently: Take 25 mg by mouth daily. ) 90 tablet 1  . mirabegron ER (MYRBETRIQ) 50 MG TB24 tablet Take 1  tablet (50 mg total) by mouth daily. 90 tablet 1  . polyethylene glycol (MIRALAX / GLYCOLAX) 17 g packet Take 17 g by mouth daily as needed for mild constipation.     . pravastatin (PRAVACHOL) 20 MG tablet TAKE 1 TABLET BY MOUTH EVERYDAY AT BEDTIME (Patient taking differently: Take 20 mg by mouth daily. ) 90 tablet 1  . torsemide (DEMADEX) 20 MG tablet Take 20 mg by mouth daily.    [name] XIIDRA 5 % SOLN Place 1 drop into both eyes 2 (two) times a day.     No current facility-administered medications on file prior to visit.     BP 114/64   Temp 98 F (36.7 C) (Temporal)   Wt 124 lb (56.2 kg)   BMI 18.31 kg/m       Objective:   Physical Exam Vitals signs and nursing note reviewed.  Constitutional:      Appearance: He is well-groomed and underweight.  Cardiovascular:     Rate and Rhythm: Rhythm regularly irregular.     Pulses: Normal pulses.     Heart sounds: Murmur present.  Pulmonary:     Effort: Pulmonary effort is normal.     Breath sounds: Normal breath sounds.  Abdominal:     General: Abdomen is flat. Bowel sounds are normal.     Palpations: Abdomen is soft. There is no mass.     Tenderness: There is abdominal tenderness. There is no guarding or rebound.     Hernia: No hernia is present.  Musculoskeletal: Normal range of motion.        General: No swelling or tenderness.     Right lower leg: 2+ Pitting Edema present.     Left lower leg: 2+ Pitting Edema present.  Skin:    General: Skin is warm and dry.     Capillary Refill: Capillary refill takes less than 2 seconds.  Neurological:     General: No focal deficit present.     Mental Status: He is alert and oriented to person, place, and time.  Psychiatric:        Mood and Affect: Mood normal.        Behavior: Behavior  normal.        Thought Content: Thought content normal.        Judgment: Judgment normal.        Assessment & Plan:  1. Slow transit constipation - Encouraged more fiber in his diet. Ok to use stool  softener.  - CBC with Differential/Platelet - Basic Metabolic Panel  2. Small bowel obstruction (HCC) - has resolved. Normoactive bowel sounds  - CBC with Differential/Platelet - Basic Metabolic Panel  3. Lower extremity edema -He is unsure if he is taking torsemide at home.  We will have him check we will call at the end of the day. - CBC with Differential/Platelet - Basic Metabolic Panel  [name], NP

## 2019-03-05 DIAGNOSIS — H02532 Eyelid retraction right lower eyelid: Secondary | ICD-10-CM | POA: Diagnosis not present

## 2019-03-05 DIAGNOSIS — H0102B Squamous blepharitis left eye, upper and lower eyelids: Secondary | ICD-10-CM | POA: Diagnosis not present

## 2019-03-05 DIAGNOSIS — H02535 Eyelid retraction left lower eyelid: Secondary | ICD-10-CM | POA: Diagnosis not present

## 2019-03-05 DIAGNOSIS — H16143 Punctate keratitis, bilateral: Secondary | ICD-10-CM | POA: Diagnosis not present

## 2019-03-05 DIAGNOSIS — H538 Other visual disturbances: Secondary | ICD-10-CM | POA: Diagnosis not present

## 2019-03-05 DIAGNOSIS — H0102A Squamous blepharitis right eye, upper and lower eyelids: Secondary | ICD-10-CM | POA: Diagnosis not present

## 2019-03-15 ENCOUNTER — Encounter: Payer: Self-pay | Admitting: Adult Health

## 2019-03-15 ENCOUNTER — Ambulatory Visit (HOSPITAL_COMMUNITY)
Admission: RE | Admit: 2019-03-15 | Discharge: 2019-03-15 | Disposition: A | Discharge status: Home / Self Care | Payer: Medicare Other | Source: Ambulatory Visit | Attending: Cardiology | Admitting: Cardiology

## 2019-03-15 ENCOUNTER — Other Ambulatory Visit: Payer: Self-pay

## 2019-03-15 ENCOUNTER — Encounter

## 2019-03-15 ENCOUNTER — Ambulatory Visit (INDEPENDENT_AMBULATORY_CARE_PROVIDER_SITE_OTHER): Payer: Medicare Other | Admitting: Adult Health

## 2019-03-15 VITALS — BP 124/68 | Temp 97.6°F | Wt 123.0 lb

## 2019-03-15 DIAGNOSIS — M5441 Lumbago with sciatica, right side: Secondary | ICD-10-CM | POA: Diagnosis not present

## 2019-03-15 DIAGNOSIS — R600 Localized edema: Secondary | ICD-10-CM

## 2019-03-15 MED ORDER — PREDNISONE 10 MG PO TABS: ORAL_TABLET | ORAL | 0 refills | Status: DC

## 2019-03-15 NOTE — Progress Notes (Signed)
Subjective:    Patient ID: Jeremy Kelley, male    DOB: Jul 07, 1926, 85 y.o.   MRN: [id number]  HPI  85 year old male who is being evaluated today for 2 separate acute issues.  First issue is that of right lower extremity edema.  First noticed this approximately 3 weeks ago after a recent hospitalization for gastric distention and small bowel obstruction.  When he got home he noticed that his right leg was swelling.  Swelling resolves at night but then once he is out of bed in the morning swelling starts back again.  He denies chest pain or shortness of breath but does have some mild tenderness to his calf and throughout his lower leg with palpation.  He is taking torsemide and Eliquis as directed  Additionally, a week to 10 days ago he started developing right lower back pain with shooting discomfort down the outside of his right leg.  Pain stops at his knee.  He reports that the pain is worse with changing positions and sitting for extended periods of time.  When he starts walking it is painful but as he gains a few steps the pain becomes more manageable.  He denies falls or aggravating injury.  Review of Systems See HPI   Past Medical History:  Diagnosis Date  . Arthritis   . Carotid artery stenosis 11/2008   bilateral 40-59% stenosis  . Colon polyps   . Diverticulosis   . Hepatitis    doesn't know which type 1969  . Hyperlipidemia   . Hypertension   . Numbness    fingertips  . Phimosis   . Prostate cancer ([place]) 2001  . Urothelial carcinoma of left distal ureter [facility])     Social History   Socioeconomic History  . Marital status: Single    Spouse name: Not on file  . Number of children: 0  . Years of education: Not on file  . Highest education level: Not on file  Occupational History  . Occupation: Retired  Scientific laboratory technician  . Financial resource strain: Not on file  . Food insecurity    Worry: Not on file    Inability: Not on file  . Transportation needs    Medical:  Not on file    Non-medical: Not on file  Tobacco Use  . Smoking status: Never Smoker  . Smokeless tobacco: Never Used  . Tobacco comment: 07/29/2015 "might smoke a cigar once/year"  Substance and Sexual Activity  . Alcohol use: Yes    Alcohol/week: 14.0 standard drinks    Types: 14 Glasses of wine per week    Comment: 07/29/2015 "big glass of wine q night"  . Drug use: No  . Sexual activity: Not on file  Lifestyle  . Physical activity    Days per week: Not on file    Minutes per session: Not on file  . Stress: Not on file  Relationships  . Social Herbalist on phone: Not on file    Gets together: Not on file    Attends religious service: Not on file    Active member of club or organization: Not on file    Attends meetings of clubs or organizations: Not on file    Relationship status: Not on file  . Intimate partner violence    Fear of current or ex partner: Not on file    Emotionally abused: Not on file    Physically abused: Not on file    Forced sexual activity:  Not on file  Other Topics Concern  . Not on file  Social History Narrative   Patient is single, has never been married.  Does not have any children.  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   He drinks approximately 3-4 glasses of wine per week.  No history of excessive alcohol use.  Occasionally smokes a cigar.   Spends winters in [place]         Past Surgical History:  Procedure Laterality Date  . BIOPSY  10/01/2011   Procedure: BIOPSY;  Surgeon: [name], MD;  Location: [facility];  Service: Urology;;  biopsy of bladder tumor  . BLADDER SUSPENSION  08/18/10   [name]  . CARDIAC CATHETERIZATION N/A 07/29/2015   Procedure: Right/Left Heart Cath and Coronary Angiography;  Surgeon: Peter M Martinique, MD;  Location: [facility];  Service: Cardiovascular;  Laterality: N/A;  . CATARACT EXTRACTION W/ INTRAOCULAR LENS  IMPLANT, BILATERAL Bilateral   . CYSTOSCOPY W/ RETROGRADES   01/27/2012   Procedure: CYSTOSCOPY WITH RETROGRADE PYELOGRAM;  Surgeon: [name], MD;  Location: [facility];  Service: Urology;  Laterality: Left;  . CYSTOSCOPY WITH RETROGRADE PYELOGRAM, URETEROSCOPY AND STENT PLACEMENT Left 11/24/2012   Procedure: CYSTOSCOPY WITH left RETROGRADE PYELOGRAM, bladder washings, ureteroscopy;  Surgeon: [name], MD;  Location: [facility];  Service: Urology;  Laterality: Left;  . CYSTOSCOPY WITH RETROGRADE PYELOGRAM, URETEROSCOPY AND STENT PLACEMENT Bilateral 11/19/2013   Procedure: CYSTOSCOPY WITH BILATERAL RETROGRADE PYELOGRAM,;  Surgeon: [name], MD;  Location: [facility];  Service: Urology;  Laterality: Bilateral;  . EYE SURGERY  09/24/10   left eye. "Retina surgery"  . INGUINAL HERNIA REPAIR  1960  . PROSTATE SURGERY     s/p laparoscopic prostate surgery  . SKIN BIOPSY  05/20/2017   Superficial and nodular basal cell carcinoma (left temple)    basal cell carcinoma with sclerosis (right temple)  . 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 SKIN CANCER EXCISION     a/p skin cancer right foot-non melanoma  . skin laceration    . TEE WITHOUT CARDIOVERSION N/A 07/29/2015   Procedure: TRANSESOPHAGEAL ECHOCARDIOGRAM (TEE);  Surgeon: Pixie Casino, MD;  [facility];  Service: Cardiovascular;  Laterality: N/A;  cath to follow  . TRANSURETHRAL RESECTION OF BLADDER TUMOR  10/01/2011   Procedure: TRANSURETHRAL RESECTION OF BLADDER TUMOR (TURBT);  Surgeon: [name], MD;  Location: [facility];  Service: Urology;  Laterality: N/A;  . TRANSURETHRAL RESECTION OF BLADDER TUMOR N/A 11/19/2013   Procedure: TRANSURETHRAL RESECTION OF BLADDER TUMOR (TURBT);  Surgeon: [name], MD;  Location: [facility];  Service: Urology;  Laterality: N/A;  . URETEROSCOPY  01/27/2012   Procedure: URETEROSCOPY;  Surgeon: [name], MD;  Location: [facility];  Service: Urology;  Laterality: Left;  CYSTO, Left RETROGRADE PYELOGRPHY, LEFT  URETEROSCOPY   . URINARY SPHINCTER IMPLANT    . VARICOSE VEIN SURGERY      Family History  Problem Relation Age of Onset  . Colon cancer Neg Hx   . Colon polyps Neg Hx     No Known Allergies  Current Outpatient Medications on File Prior to Visit  Medication Sig Dispense Refill  . B Complex-C (B-COMPLEX WITH VITAMIN C) tablet Take 1 tablet by mouth daily. Reported on 07/24/2015 90 tablet 3  . cholecalciferol (VITAMIN D) 1000 units tablet Take 1 tablet (1,000 Units total) by mouth daily. 90 tablet 3  . docusate sodium (  COLACE) 100 MG capsule Take 1 capsule (100 mg total) by mouth 2 (two) times daily. 10 capsule 0  . donepezil (ARICEPT) 5 MG tablet Take 1/2 tablet daily for 2 weeks, then increase to 1 tablet daily 30 tablet 11  . ELIQUIS 2.5 MG TABS tablet TAKE 1 TABLET BY MOUTH TWICE A DAY 180 tablet 1  . metoprolol succinate (TOPROL-XL) 25 MG 24 hr tablet TAKE 1 TABLET BY MOUTH EVERYDAY AT BEDTIME (Patient taking differently: Take 25 mg by mouth daily. ) 90 tablet 1  . mirabegron ER (MYRBETRIQ) 50 MG TB24 tablet Take 1 tablet (50 mg total) by mouth daily. 90 tablet 1  . polyethylene glycol (MIRALAX / GLYCOLAX) 17 g packet Take 17 g by mouth daily as needed for mild constipation.     . Potassium Chloride (KLOR-CON 10 PO) Take 1 tablet by mouth 2 (two) times daily.    . pravastatin (PRAVACHOL) 20 MG tablet TAKE 1 TABLET BY MOUTH EVERYDAY AT BEDTIME (Patient taking differently: Take 20 mg by mouth daily. ) 90 tablet 1  . torsemide (DEMADEX) 20 MG tablet Take 20 mg by mouth daily.    [name] XIIDRA 5 % SOLN Place 1 drop into both eyes 2 (two) times a day.     No current facility-administered medications on file prior to visit.     BP 124/68   Temp 97.6 F (36.4 C)   Wt 123 lb (55.8 kg)   BMI 18.16 kg/m       Objective:   Physical Exam Vitals signs and nursing note reviewed.  Constitutional:      Appearance: Normal appearance.  Cardiovascular:     Rate and Rhythm: Normal rate and  regular rhythm.     Pulses: Normal pulses.     Heart sounds: Normal heart sounds.  Pulmonary:     Effort: Pulmonary effort is normal.     Breath sounds: Normal breath sounds.  Musculoskeletal:        General: Tenderness (Right lower back as well as along the sciatic nerve) present.     Right lower leg: Edema present.     Comments: +2 pitting edema noted to right lower extremity from ankle to mid calf.  He does have tenderness to his calf as well as throughout his right foot with palpation.  No redness or warmth noted  Skin:    General: Skin is warm and dry.     Capillary Refill: Capillary refill takes less than 2 seconds.  Neurological:     General: No focal deficit present.     Mental Status: He is alert and oriented to person, place, and time.  Psychiatric:        Mood and Affect: Mood normal.        Behavior: Behavior normal.        Thought Content: Thought content normal.        Judgment: Judgment normal.       Assessment & Plan:  1. Edema of right lower extremity - some concern for DVT despite taking Eliquis. Will get [place] of right lower extremity. Consider referral to vascualr  - VAS [place] LOWER EXTREMITY VENOUS (DVT); Future  2. Acute right-sided low back pain with right-sided sciatica  - predniSONE (DELTASONE) 10 MG tablet; 40 mg x 3 days, 20 mg x 3 days, 10 mg x 3 days  Dispense: 21 tablet; Refill: 0   [name], NP

## 2019-03-16 ENCOUNTER — Other Ambulatory Visit: Payer: Self-pay | Admitting: Adult Health

## 2019-03-16 DIAGNOSIS — R600 Localized edema: Secondary | ICD-10-CM

## 2019-03-19 DIAGNOSIS — L905 Scar conditions and fibrosis of skin: Secondary | ICD-10-CM | POA: Diagnosis not present

## 2019-03-19 DIAGNOSIS — Z85828 Personal history of other malignant neoplasm of skin: Secondary | ICD-10-CM | POA: Diagnosis not present

## 2019-03-20 ENCOUNTER — Ambulatory Visit: Payer: Medicare Other | Admitting: Adult Health

## 2019-03-20 ENCOUNTER — Telehealth (INDEPENDENT_AMBULATORY_CARE_PROVIDER_SITE_OTHER): Payer: Medicare Other | Admitting: Adult Health

## 2019-03-20 ENCOUNTER — Telehealth: Payer: Self-pay | Admitting: Adult Health

## 2019-03-20 ENCOUNTER — Other Ambulatory Visit: Payer: Self-pay

## 2019-03-20 DIAGNOSIS — M5442 Lumbago with sciatica, left side: Secondary | ICD-10-CM | POA: Diagnosis not present

## 2019-03-20 MED ORDER — PREDNISONE 10 MG PO TABS: ORAL_TABLET | ORAL | 0 refills | Status: DC

## 2019-03-20 NOTE — Telephone Encounter (Signed)
Pt is scheduled for telephone visit today at 2 PM.  Nothing further needed.

## 2019-03-20 NOTE — Progress Notes (Signed)
Virtual Visit via Telephone Note  I connected with Jeremy Kelley on 03/20/19 at  2:00 PM EST by telephone and verified that I am speaking with the correct person using two identifiers.   I discussed the limitations, risks, security and privacy concerns of performing an evaluation and management service by telephone and the availability of in person appointments. I also discussed with the patient that there may be a patient responsible charge related to this service. The patient expressed understanding and agreed to proceed.  Location patient: home Location provider: work or home office Participants present for the call: patient, provider, friend Bethena Roys)  Patient did not have a visit in the prior 7 days to address this/these issue(s).   History of Present Illness: 85 year old male who is being evaluated today for the acute issue of left leg pain.  He was seen on 03/15/2019 for right sided low back pain with sciatica.  He was prescribed a prednisone taper.  He started this medication 2 days ago and reports that his right side started to feel better.  Yesterday around 5 PM he was in a parking lot walking to his car, he believes that he "took a wrong step" which caused mild pain to start in his left lower back and radiate down his left leg.  Last night pain became worse with a deep pain and he fell off of balance.  Pain is also present with changes in positions chest is going from a sitting to a standing position.  Pain is located on the outside of his left leg radiates from his hip down to below his knee.  He has not taken his prednisone dose yet today and does report that he accidentally dropped multiple prednisone pills on the floor earlier in the week and he threw those away.  His friend who is with him today, is a tired nurse.  She does not endorse any redness, warmth, or edema to left calf and there is no swelling around the left leg.   Observations/Objective: Patient sounds cheerful and well  on the phone. I do not appreciate any SOB. Speech and thought processing are grossly intact. Patient reported vitals:  Assessment and Plan: 1. Acute left-sided low back pain with left-sided sciatica -We will treat likely left-sided sciatica.  Will send in a new prednisone course for him to take since he threw away the pills that he dropped on the floor. - predniSONE (DELTASONE) 10 MG tablet; 40 mg x 3 days, 20 mg x 3 days, 10 mg x 3 days  Dispense: 21 tablet; Refill: 0   Follow Up Instructions:  I did not refer this patient for an OV in the next 24 hours for this/these issue(s).  I discussed the assessment and treatment plan with the patient. The patient was provided an opportunity to ask questions and all were answered. The patient agreed with the plan and demonstrated an understanding of the instructions.   The patient was advised to call back or seek an in-person evaluation if the symptoms worsen or if the condition fails to improve as anticipated.  I provided 20 minutes of non-face-to-face time during this encounter.   [name], NP

## 2019-03-20 NOTE — Telephone Encounter (Signed)
Pt is calling in wanting to be seen in the office for his sciatic nerve on his left side.  Pt state that he is not able to put any pressure on his left leg since he had a procedure done last week.  Pt would like to see if Jeremy Kelley would allow him to come in the office.  Pt would like to have a call back today please.

## 2019-03-23 ENCOUNTER — Telehealth: Payer: Self-pay | Admitting: Adult Health

## 2019-03-23 MED ORDER — TRAMADOL HCL 50 MG PO TABS: 50.0000 mg | ORAL_TABLET | Freq: Two times a day (BID) | ORAL | 0 refills | Status: DC | PRN

## 2019-03-23 NOTE — Telephone Encounter (Signed)
Patient still having sciatica pain. Has not improved with prednisone, feels as though some days it is getting worse.   Will send in short course of tramadol and then reevaluate him early next week   Likely will need MRI

## 2019-03-25 ENCOUNTER — Encounter (HOSPITAL_COMMUNITY): Payer: Self-pay | Admitting: Emergency Medicine

## 2019-03-25 ENCOUNTER — Emergency Department (HOSPITAL_COMMUNITY): Payer: Medicare Other

## 2019-03-25 ENCOUNTER — Inpatient Hospital Stay (HOSPITAL_COMMUNITY)
Admission: EM | Admit: 2019-03-25 | Discharge: 2019-03-30 | DRG: 522 | Disposition: A | Discharge status: Skilled Nursing Facility | Payer: Medicare Other | Attending: Internal Medicine | Admitting: Internal Medicine

## 2019-03-25 ENCOUNTER — Other Ambulatory Visit: Payer: Self-pay

## 2019-03-25 DIAGNOSIS — S0990XA Unspecified injury of head, initial encounter: Secondary | ICD-10-CM | POA: Diagnosis present

## 2019-03-25 DIAGNOSIS — Z960 Presence of urogenital implants: Secondary | ICD-10-CM | POA: Diagnosis present

## 2019-03-25 DIAGNOSIS — I499 Cardiac arrhythmia, unspecified: Secondary | ICD-10-CM | POA: Diagnosis not present

## 2019-03-25 DIAGNOSIS — I4891 Unspecified atrial fibrillation: Secondary | ICD-10-CM | POA: Diagnosis present

## 2019-03-25 DIAGNOSIS — E785 Hyperlipidemia, unspecified: Secondary | ICD-10-CM | POA: Diagnosis present

## 2019-03-25 DIAGNOSIS — Z66 Do not resuscitate: Secondary | ICD-10-CM | POA: Diagnosis present

## 2019-03-25 DIAGNOSIS — W010XXA Fall on same level from slipping, tripping and stumbling without subsequent striking against object, initial encounter: Secondary | ICD-10-CM | POA: Diagnosis present

## 2019-03-25 DIAGNOSIS — J309 Allergic rhinitis, unspecified: Secondary | ICD-10-CM | POA: Diagnosis present

## 2019-03-25 DIAGNOSIS — I69828 Other speech and language deficits following other cerebrovascular disease: Secondary | ICD-10-CM | POA: Diagnosis not present

## 2019-03-25 DIAGNOSIS — Z96642 Presence of left artificial hip joint: Secondary | ICD-10-CM | POA: Diagnosis not present

## 2019-03-25 DIAGNOSIS — H3530 Unspecified macular degeneration: Secondary | ICD-10-CM | POA: Diagnosis not present

## 2019-03-25 DIAGNOSIS — Z20828 Contact with and (suspected) exposure to other viral communicable diseases: Secondary | ICD-10-CM | POA: Diagnosis present

## 2019-03-25 DIAGNOSIS — F0390 Unspecified dementia without behavioral disturbance: Secondary | ICD-10-CM | POA: Diagnosis present

## 2019-03-25 DIAGNOSIS — Z8551 Personal history of malignant neoplasm of bladder: Secondary | ICD-10-CM | POA: Diagnosis not present

## 2019-03-25 DIAGNOSIS — I110 Hypertensive heart disease with heart failure: Secondary | ICD-10-CM | POA: Diagnosis present

## 2019-03-25 DIAGNOSIS — X501XXA Overexertion from prolonged static or awkward postures, initial encounter: Secondary | ICD-10-CM | POA: Diagnosis not present

## 2019-03-25 DIAGNOSIS — D72829 Elevated white blood cell count, unspecified: Secondary | ICD-10-CM | POA: Diagnosis present

## 2019-03-25 DIAGNOSIS — Z85828 Personal history of other malignant neoplasm of skin: Secondary | ICD-10-CM | POA: Diagnosis not present

## 2019-03-25 DIAGNOSIS — Z79899 Other long term (current) drug therapy: Secondary | ICD-10-CM

## 2019-03-25 DIAGNOSIS — I5032 Chronic diastolic (congestive) heart failure: Secondary | ICD-10-CM | POA: Diagnosis present

## 2019-03-25 DIAGNOSIS — Z96649 Presence of unspecified artificial hip joint: Secondary | ICD-10-CM | POA: Diagnosis not present

## 2019-03-25 DIAGNOSIS — Z79891 Long term (current) use of opiate analgesic: Secondary | ICD-10-CM

## 2019-03-25 DIAGNOSIS — Z7901 Long term (current) use of anticoagulants: Secondary | ICD-10-CM

## 2019-03-25 DIAGNOSIS — I272 Pulmonary hypertension, unspecified: Secondary | ICD-10-CM | POA: Diagnosis not present

## 2019-03-25 DIAGNOSIS — S72012A Unspecified intracapsular fracture of left femur, initial encounter for closed fracture: Principal | ICD-10-CM | POA: Diagnosis present

## 2019-03-25 DIAGNOSIS — S72009A Fracture of unspecified part of neck of unspecified femur, initial encounter for closed fracture: Secondary | ICD-10-CM | POA: Diagnosis present

## 2019-03-25 DIAGNOSIS — R2681 Unsteadiness on feet: Secondary | ICD-10-CM | POA: Diagnosis not present

## 2019-03-25 DIAGNOSIS — I2510 Atherosclerotic heart disease of native coronary artery without angina pectoris: Secondary | ICD-10-CM | POA: Diagnosis present

## 2019-03-25 DIAGNOSIS — R41841 Cognitive communication deficit: Secondary | ICD-10-CM | POA: Diagnosis not present

## 2019-03-25 DIAGNOSIS — Z7289 Other problems related to lifestyle: Secondary | ICD-10-CM | POA: Diagnosis not present

## 2019-03-25 DIAGNOSIS — Z8546 Personal history of malignant neoplasm of prostate: Secondary | ICD-10-CM | POA: Diagnosis not present

## 2019-03-25 DIAGNOSIS — I10 Essential (primary) hypertension: Secondary | ICD-10-CM | POA: Diagnosis not present

## 2019-03-25 DIAGNOSIS — I482 Chronic atrial fibrillation, unspecified: Secondary | ICD-10-CM | POA: Diagnosis present

## 2019-03-25 DIAGNOSIS — R1312 Dysphagia, oropharyngeal phase: Secondary | ICD-10-CM | POA: Diagnosis not present

## 2019-03-25 DIAGNOSIS — I509 Heart failure, unspecified: Secondary | ICD-10-CM | POA: Diagnosis not present

## 2019-03-25 DIAGNOSIS — I351 Nonrheumatic aortic (valve) insufficiency: Secondary | ICD-10-CM | POA: Diagnosis not present

## 2019-03-25 DIAGNOSIS — M6281 Muscle weakness (generalized): Secondary | ICD-10-CM | POA: Diagnosis not present

## 2019-03-25 DIAGNOSIS — S72002A Fracture of unspecified part of neck of left femur, initial encounter for closed fracture: Secondary | ICD-10-CM

## 2019-03-25 DIAGNOSIS — S72002D Fracture of unspecified part of neck of left femur, subsequent encounter for closed fracture with routine healing: Secondary | ICD-10-CM | POA: Diagnosis not present

## 2019-03-25 DIAGNOSIS — C679 Malignant neoplasm of bladder, unspecified: Secondary | ICD-10-CM | POA: Diagnosis not present

## 2019-03-25 DIAGNOSIS — Z9181 History of falling: Secondary | ICD-10-CM | POA: Diagnosis not present

## 2019-03-25 LAB — CBC WITH DIFFERENTIAL/PLATELET
Abs Immature Granulocytes: 0.09 10*3/uL — ABNORMAL HIGH (ref 0.00–0.07)
Basophils Absolute: 0 10*3/uL (ref 0.0–0.1)
Basophils Relative: 0 %
Eosinophils Absolute: 0 10*3/uL (ref 0.0–0.5)
Eosinophils Relative: 0 %
HCT: 43.9 % (ref 39.0–52.0)
Hemoglobin: 14.3 g/dL (ref 13.0–17.0)
Immature Granulocytes: 1 %
Lymphocytes Relative: 6 %
Lymphs Abs: 0.9 10*3/uL (ref 0.7–4.0)
MCH: 32.6 pg (ref 26.0–34.0)
MCHC: 32.6 g/dL (ref 30.0–36.0)
MCV: 100.2 fL — ABNORMAL HIGH (ref 80.0–100.0)
Monocytes Absolute: 0.7 10*3/uL (ref 0.1–1.0)
Monocytes Relative: 5 %
Neutro Abs: 12.4 10*3/uL — ABNORMAL HIGH (ref 1.7–7.7)
Neutrophils Relative %: 88 %
Platelets: 237 10*3/uL (ref 150–400)
RBC: 4.38 MIL/uL (ref 4.22–5.81)
RDW: 13.5 % (ref 11.5–15.5)
WBC: 14.2 10*3/uL — ABNORMAL HIGH (ref 4.0–10.5)
nRBC: 0 % (ref 0.0–0.2)

## 2019-03-25 LAB — BASIC METABOLIC PANEL
Anion gap: 11 (ref 5–15)
BUN: 39 mg/dL — ABNORMAL HIGH (ref 8–23)
CO2: 34 mmol/L — ABNORMAL HIGH (ref 22–32)
Calcium: 9.1 mg/dL (ref 8.9–10.3)
Chloride: 96 mmol/L — ABNORMAL LOW (ref 98–111)
Creatinine, Ser: 1.15 mg/dL (ref 0.61–1.24)
GFR calc Af Amer: >60 mL/min (ref >60)
GFR calc non Af Amer: 56 mL/min — ABNORMAL LOW (ref >60)
Glucose, Bld: 118 mg/dL — ABNORMAL HIGH (ref 70–99)
Potassium: 4.6 mmol/L (ref 3.5–5.1)
Sodium: 141 mmol/L (ref 135–145)

## 2019-03-25 LAB — SARS CORONAVIRUS 2 (TAT 6-24 HRS): SARS Coronavirus 2: NEGATIVE

## 2019-03-25 MED ORDER — METOPROLOL SUCCINATE ER 25 MG PO TB24
25.0000 mg | ORAL_TABLET | Freq: Every day | ORAL | Status: DC
  Administered 2019-03-26 – 2019-03-30 (×5): 25 mg via ORAL
  Filled 2019-03-25 (×6): qty 1

## 2019-03-25 MED ORDER — METHOCARBAMOL 500 MG PO TABS
750.0000 mg | ORAL_TABLET | Freq: Three times a day (TID) | ORAL | Status: DC
  Administered 2019-03-25 – 2019-03-30 (×11): 750 mg via ORAL
  Filled 2019-03-25 (×12): qty 2

## 2019-03-25 MED ORDER — ACETAMINOPHEN 325 MG PO TABS: 650.0000 mg | ORAL_TABLET | Freq: Four times a day (QID) | ORAL | Status: DC | PRN

## 2019-03-25 MED ORDER — ALBUTEROL SULFATE (2.5 MG/3ML) 0.083% IN NEBU: 2.5000 mg | INHALATION_SOLUTION | RESPIRATORY_TRACT | Status: DC | PRN

## 2019-03-25 MED ORDER — OXYCODONE HCL 5 MG PO TABS
5.0000 mg | ORAL_TABLET | ORAL | Status: DC | PRN
  Administered 2019-03-26: 5 mg via ORAL
  Filled 2019-03-25: qty 1

## 2019-03-25 MED ORDER — POLYETHYLENE GLYCOL 3350 17 G PO PACK: 17.0000 g | PACK | Freq: Every day | ORAL | Status: DC | PRN

## 2019-03-25 MED ORDER — ACETAMINOPHEN 500 MG PO TABS
1000.0000 mg | ORAL_TABLET | Freq: Once | ORAL | Status: AC
  Administered 2019-03-25: 1000 mg via ORAL
  Filled 2019-03-25: qty 2

## 2019-03-25 MED ORDER — PRAVASTATIN SODIUM 20 MG PO TABS
20.0000 mg | ORAL_TABLET | Freq: Every day | ORAL | Status: DC
  Administered 2019-03-27 – 2019-03-30 (×4): 20 mg via ORAL
  Filled 2019-03-25 (×4): qty 1

## 2019-03-25 MED ORDER — CHLORHEXIDINE GLUCONATE 4 % EX LIQD: 60.0000 mL | Freq: Once | CUTANEOUS | Status: DC

## 2019-03-25 MED ORDER — SODIUM CHLORIDE 0.9% FLUSH
3.0000 mL | Freq: Two times a day (BID) | INTRAVENOUS | Status: DC
  Administered 2019-03-27 – 2019-03-30 (×2): 3 mL via INTRAVENOUS

## 2019-03-25 MED ORDER — VITAMIN D 25 MCG (1000 UNIT) PO TABS
1000.0000 [IU] | ORAL_TABLET | Freq: Every day | ORAL | Status: DC
  Administered 2019-03-27 – 2019-03-30 (×4): 1000 [IU] via ORAL
  Filled 2019-03-25 (×4): qty 1

## 2019-03-25 MED ORDER — SODIUM CHLORIDE 0.9 % IV SOLN: 250.0000 mL | INTRAVENOUS | Status: DC | PRN

## 2019-03-25 MED ORDER — MORPHINE SULFATE (PF) 4 MG/ML IV SOLN
4.0000 mg | Freq: Once | INTRAVENOUS | Status: AC
  Administered 2019-03-25: 4 mg via INTRAVENOUS
  Filled 2019-03-25: qty 1

## 2019-03-25 MED ORDER — LIFITEGRAST 5 % OP SOLN: 1.0000 [drp] | Freq: Every day | OPHTHALMIC | Status: DC

## 2019-03-25 MED ORDER — B COMPLEX-C PO TABS
1.0000 | ORAL_TABLET | Freq: Every day | ORAL | Status: DC
  Administered 2019-03-27 – 2019-03-30 (×4): 1 via ORAL
  Filled 2019-03-25 (×5): qty 1

## 2019-03-25 MED ORDER — ONDANSETRON HCL 4 MG/2ML IJ SOLN
4.0000 mg | Freq: Once | INTRAMUSCULAR | Status: AC
  Administered 2019-03-25: 4 mg via INTRAVENOUS
  Filled 2019-03-25: qty 2

## 2019-03-25 MED ORDER — ENSURE PRE-SURGERY PO LIQD
296.0000 mL | Freq: Once | ORAL | Status: AC
  Administered 2019-03-25: 296 mL via ORAL
  Filled 2019-03-25: qty 296

## 2019-03-25 MED ORDER — OXYCODONE HCL 5 MG PO TABS
2.5000 mg | ORAL_TABLET | Freq: Once | ORAL | Status: AC
  Administered 2019-03-25: 2.5 mg via ORAL
  Filled 2019-03-25: qty 1

## 2019-03-25 MED ORDER — TRAZODONE HCL 50 MG PO TABS: 50.0000 mg | ORAL_TABLET | Freq: Every evening | ORAL | Status: DC | PRN

## 2019-03-25 MED ORDER — DONEPEZIL HCL 10 MG PO TABS
5.0000 mg | ORAL_TABLET | Freq: Every day | ORAL | Status: DC
  Administered 2019-03-26 – 2019-03-29 (×4): 5 mg via ORAL
  Filled 2019-03-25 (×4): qty 1

## 2019-03-25 MED ORDER — MIRABEGRON ER 25 MG PO TB24
50.0000 mg | ORAL_TABLET | Freq: Every day | ORAL | Status: DC
  Administered 2019-03-27 – 2019-03-30 (×4): 50 mg via ORAL
  Filled 2019-03-25 (×5): qty 2

## 2019-03-25 MED ORDER — SENNOSIDES-DOCUSATE SODIUM 8.6-50 MG PO TABS
2.0000 | ORAL_TABLET | Freq: Two times a day (BID) | ORAL | Status: DC
  Administered 2019-03-25 – 2019-03-29 (×7): 2 via ORAL
  Filled 2019-03-25 (×9): qty 2

## 2019-03-25 MED ORDER — CHLORHEXIDINE GLUCONATE CLOTH 2 % EX PADS
6.0000 | MEDICATED_PAD | Freq: Every day | CUTANEOUS | Status: DC
  Administered 2019-03-27: 6 via TOPICAL

## 2019-03-25 MED ORDER — ONDANSETRON HCL 4 MG/2ML IJ SOLN: 4.0000 mg | Freq: Four times a day (QID) | INTRAMUSCULAR | Status: DC | PRN

## 2019-03-25 MED ORDER — DEXTROSE-NACL 5-0.45 % IV SOLN
INTRAVENOUS | Status: DC
  Administered 2019-03-25 (×2): via INTRAVENOUS

## 2019-03-25 MED ORDER — SODIUM CHLORIDE 0.9% FLUSH: 3.0000 mL | INTRAVENOUS | Status: DC | PRN

## 2019-03-25 MED ORDER — MORPHINE SULFATE (PF) 2 MG/ML IV SOLN
2.0000 mg | INTRAVENOUS | Status: DC | PRN
  Administered 2019-03-25 – 2019-03-26 (×2): 2 mg via INTRAVENOUS
  Filled 2019-03-25 (×2): qty 1

## 2019-03-25 MED ORDER — ONDANSETRON HCL 4 MG PO TABS: 4.0000 mg | ORAL_TABLET | Freq: Four times a day (QID) | ORAL | Status: DC | PRN

## 2019-03-25 MED ORDER — ACETAMINOPHEN 650 MG RE SUPP: 650.0000 mg | Freq: Four times a day (QID) | RECTAL | Status: DC | PRN

## 2019-03-25 MED ORDER — LABETALOL HCL 5 MG/ML IV SOLN
10.0000 mg | INTRAVENOUS | Status: DC | PRN
  Filled 2019-03-25: qty 4

## 2019-03-25 MED ORDER — DONEPEZIL HCL 10 MG PO TABS: 10.0000 mg | ORAL_TABLET | Freq: Every day | ORAL | Status: DC

## 2019-03-25 NOTE — ED Notes (Signed)
Pt is saying that he came in because of his sciatic. He isn't discussing the head injury.
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
## 2019-03-25 NOTE — ED Notes (Signed)
Pt said he has difficulty swallowing, so I gave his medications in applesauce.

## 2019-03-25 NOTE — H&P (View-Only) (Signed)
Reason for Consult:left hip fracture Referring Physician: Courage MD  CHAPEL ZELLAR is an 85 y.o. male.  HPI: 85 year old male states he was walking to his car on around 12 6 when he turned thinking he took a wrong step and had mild pain in his left hip region.  He started having some increased pain the following day which gradually progressed to the point presented to emergency room yesterday on 03/24/2019 with significant increased left hip pain and inability to walk.  X-rays demonstrated displaced subcapital fracture of the left femoral neck.  Patient has some disc degeneration and lumbar spurring and originally his pain was felt to possibly be due to his lumbar region and MRI scan of his back has been pending.  When he fell he hit his hip and x-rays and CT scan of his hip were negative for fracture or subdural or epidural hematoma.  Patient has been on Eliquis with history of atrial fibrillation.  Patient is originally from [place] he worked for [employer] for many years and then retired.  Past history of atrial fibrillation bowel bladder cancer coronary artery disease carotid bruit with 40 to 60% carotid stenosis bilaterally.  Past Medical History:  Diagnosis Date  . Arthritis   . Carotid artery stenosis 11/2008   bilateral 40-59% stenosis  . Colon polyps   . Diverticulosis   . Hepatitis    doesn't know which type 1969  . Hyperlipidemia   . Hypertension   . Numbness    fingertips  . Phimosis   . Prostate cancer ([place]) 2001  . Urothelial carcinoma of left distal ureter [facility])     Past Surgical History:  Procedure Laterality Date  . BIOPSY  10/01/2011   Procedure: BIOPSY;  Surgeon: [name], MD;  Location: [facility];  Service: Urology;;  biopsy of bladder tumor  . BLADDER SUSPENSION  08/18/10   [name]  . CARDIAC CATHETERIZATION N/A 07/29/2015   Procedure: Right/Left Heart Cath and Coronary Angiography;  Surgeon: Peter M Martinique, MD;  Location: [place] [facility];  Service:  Cardiovascular;  Laterality: N/A;  . CATARACT EXTRACTION W/ INTRAOCULAR LENS  IMPLANT, BILATERAL Bilateral   . CYSTOSCOPY W/ RETROGRADES  01/27/2012   Procedure: CYSTOSCOPY WITH RETROGRADE PYELOGRAM;  Surgeon: [name], MD;  Location: [facility];  Service: Urology;  Laterality: Left;  . CYSTOSCOPY WITH RETROGRADE PYELOGRAM, URETEROSCOPY AND STENT PLACEMENT Left 11/24/2012   Procedure: CYSTOSCOPY WITH left RETROGRADE PYELOGRAM, bladder washings, ureteroscopy;  Surgeon: [name], MD;  Location: [facility];  Service: Urology;  Laterality: Left;  . CYSTOSCOPY WITH RETROGRADE PYELOGRAM, URETEROSCOPY AND STENT PLACEMENT Bilateral 11/19/2013   Procedure: CYSTOSCOPY WITH BILATERAL RETROGRADE PYELOGRAM,;  Surgeon: [name], MD;  Location: [facility];  Service: Urology;  Laterality: Bilateral;  . EYE SURGERY  09/24/10   left eye. "Retina surgery"  . INGUINAL HERNIA REPAIR  1960  . PROSTATE SURGERY     s/p laparoscopic prostate surgery  . SKIN BIOPSY  05/20/2017   Superficial and nodular basal cell carcinoma (left temple)    basal cell carcinoma with sclerosis (right temple)  . 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 SKIN CANCER EXCISION     a/p skin cancer right foot-non melanoma  . skin laceration    . TEE WITHOUT CARDIOVERSION N/A 07/29/2015   Procedure: TRANSESOPHAGEAL ECHOCARDIOGRAM (TEE);  Surgeon: Pixie Casino, MD;  Location: [facility];  Service: Cardiovascular;  Laterality: N/A;  cath  to follow  . TRANSURETHRAL RESECTION OF BLADDER TUMOR  10/01/2011   Procedure: TRANSURETHRAL RESECTION OF BLADDER TUMOR (TURBT);  Surgeon: [name], MD;  Location: [facility];  Service: Urology;  Laterality: N/A;  . TRANSURETHRAL RESECTION OF BLADDER TUMOR N/A 11/19/2013   Procedure: TRANSURETHRAL RESECTION OF BLADDER TUMOR (TURBT);  Surgeon: [name], MD;  Location: [facility];  Service: Urology;  Laterality: N/A;  . URETEROSCOPY  01/27/2012   Procedure:  URETEROSCOPY;  Surgeon: [name], MD;  Location: [facility];  Service: Urology;  Laterality: Left;  CYSTO, Left RETROGRADE PYELOGRPHY, LEFT URETEROSCOPY   . URINARY SPHINCTER IMPLANT    . VARICOSE VEIN SURGERY      Family History  Problem Relation Age of Onset  . Colon cancer Neg Hx   . Colon polyps Neg Hx     Social History:  reports that he has never smoked. He has never used smokeless tobacco. He reports current alcohol use of about 14.0 standard drinks of alcohol per week. He reports that he does not use drugs.  Allergies: No Known Allergies  Medications: I have reviewed the patient's current medications.  Results for orders placed or performed during the hospital encounter of 03/25/19 (from the past 48 hour(s))  CBC with Differential     Status: Abnormal   Collection Time: 03/25/19  6:01 AM  Result Value Ref Range   WBC 14.2 (H) 4.0 - 10.5 K/uL   RBC 4.38 4.22 - 5.81 MIL/uL   Hemoglobin 14.3 13.0 - 17.0 g/dL   HCT 43.9 39.0 - 52.0 %   MCV 100.2 (H) 80.0 - 100.0 fL   MCH 32.6 26.0 - 34.0 pg   MCHC 32.6 30.0 - 36.0 g/dL   RDW 13.5 11.5 - 15.5 %   Platelets 237 150 - 400 K/uL   nRBC 0.0 0.0 - 0.2 %   Neutrophils Relative % 88 %   Neutro Abs 12.4 (H) 1.7 - 7.7 K/uL   Lymphocytes Relative 6 %   Lymphs Abs 0.9 0.7 - 4.0 K/uL   Monocytes Relative 5 %   Monocytes Absolute 0.7 0.1 - 1.0 K/uL   Eosinophils Relative 0 %   Eosinophils Absolute 0.0 0.0 - 0.5 K/uL   Basophils Relative 0 %   Basophils Absolute 0.0 0.0 - 0.1 K/uL   Immature Granulocytes 1 %   Abs Immature Granulocytes 0.09 (H) 0.00 - 0.07 K/uL    Comment: Performed at [facility], [place] [address] [place], [place] [id number]  Basic metabolic panel     Status: Abnormal   Collection Time: 03/25/19  6:01 AM  Result Value Ref Range   Sodium 141 135 - 145 mmol/L   Potassium 4.6 3.5 - 5.1 mmol/L   Chloride 96 (L) 98 - 111 mmol/L   CO2 34 (H) 22 - 32 mmol/L   Glucose, Bld 118 (H) 70 - 99 mg/dL   BUN  39 (H) 8 - 23 mg/dL   Creatinine, Ser 1.15 0.61 - 1.24 mg/dL   Calcium 9.1 8.9 - 10.3 mg/dL   GFR calc non Af Amer 56 (L) >60 mL/min   GFR calc Af Amer >60 >60 mL/min   Anion gap 11 5 - 15    Comment: Performed at [facility], [place] [address] [place], [place] [address]  SARS CORONAVIRUS 2 (TAT 6-24 HRS) Nasopharyngeal Nasopharyngeal Swab     Status: None   Collection Time: 03/25/19  6:01 AM   Specimen: Nasopharyngeal Swab  Result Value Ref  Range   SARS Coronavirus 2 NEGATIVE NEGATIVE    Comment: (NOTE) SARS-CoV-2 target nucleic acids are NOT DETECTED. The SARS-CoV-2 RNA is generally detectable in upper and lower respiratory specimens during the acute phase of infection. Negative results do not preclude SARS-CoV-2 infection, do not rule out co-infections with other pathogens, and should not be used as the sole basis for treatment or other patient management decisions. Negative results must be combined with clinical observations, patient history, and epidemiological information. The expected result is Negative. Fact Sheet for Patients: SugarRoll.be Fact Sheet for Healthcare Providers: https://www.woods-mathews.com/ This test is not yet approved or cleared by the [place] FDA and  has been authorized for detection and/or diagnosis of SARS-CoV-2 by FDA under an Emergency Use Authorization (EUA). This EUA will remain  in effect (meaning this test can be used) for the duration of the COVID-19 declaration under Section 56 4(b)(1) of the Act, 21 U.S.C. section 360bbb-3(b)(1), unless the authorization is terminated or revoked sooner. Performed at [facility], [place] [address], [place], [place] [address]     DG Lumbar Spine Complete  Result Date: 03/25/2019 CLINICAL DATA:  Fall, hip pain EXAM: LUMBAR SPINE - COMPLETE 4+ VIEW COMPARISON:  None. FINDINGS: Normal alignment of the vertebral bodies. There is severe joint  space narrowing from L2-S1. No loss vertebral body height. Endplate osteophytosis. IMPRESSION: No acute findings lumbar spine. Multilevel disc osteophytic disease and joint space narrowing. Electronically Signed   By: Suzy Bouchard M.D.   On: 03/25/2019 05:01   CT Head Wo Contrast  Result Date: 03/25/2019 CLINICAL DATA:  Minor trauma EXAM: CT HEAD WITHOUT CONTRAST TECHNIQUE: Contiguous axial images were obtained from the base of the skull through the vertex without intravenous contrast. COMPARISON:  None. FINDINGS: Brain: No acute intracranial hemorrhage. No focal mass lesion. No CT evidence of acute infarction. No midline shift or mass effect. No hydrocephalus. Basilar cisterns are patent. There are periventricular and subcortical white matter hypodensities. Generalized cortical atrophy. Vascular: No hyperdense vessel or unexpected calcification. Skull: Normal. Negative for fracture or focal lesion. Sinuses/Orbits: Paranasal sinuses and mastoid air cells are clear. Orbits are clear. A polypoid mucosal lesion the LEFT ethmoid sinus not changed from comparison MRI. Consistent with mucous retention cyst or polyp. Other: None. IMPRESSION: 1. No acute intracranial findings. 2. Atrophy and white matter microvascular disease. Electronically Signed   By: Suzy Bouchard M.D.   On: 03/25/2019 04:54   DG Chest Port 1 View  Result Date: 03/25/2019 CLINICAL DATA:  Preoperative chest radiograph EXAM: PORTABLE CHEST 1 VIEW COMPARISON:  None. FINDINGS: Moderate cardiomegaly. Lungs are mildly hyperinflated. Pulmonary arteries are prominent. No sizable pleural effusion. No focal airspace consolidation or pulmonary edema. IMPRESSION: 1. No active cardiopulmonary disease. 2. Moderate cardiomegaly. Electronically Signed   By: Ulyses Jarred M.D.   On: 03/25/2019 05:40   DG Knee Complete 4 Views Left  Result Date: 03/25/2019 CLINICAL DATA:  Left hip pain.  Fall. EXAM: LEFT KNEE - COMPLETE 4+ VIEW COMPARISON:  None.  FINDINGS: No evidence of fracture, dislocation, or joint effusion. No evidence of arthropathy or other focal bone abnormality. Soft tissues are unremarkable. IMPRESSION: Negative. Electronically Signed   By: Ulyses Jarred M.D.   On: 03/25/2019 05:09   DG Hip Unilat W or Wo Pelvis 2-3 Views Left  Result Date: 03/25/2019 CLINICAL DATA:  Fall, hip pain EXAM: DG HIP (WITH OR WITHOUT PELVIS) 2-3V LEFT COMPARISON:  None. FINDINGS: Subcapital fracture of the LEFT femoral neck with varus  angulation. No dislocation. There is some proximal migration of the distal fracture segment. IMPRESSION: Subcapital fracture of the LEFT femoral neck. Electronically Signed   By: Suzy Bouchard M.D.   On: 03/25/2019 04:59    ROS positive for atrial fibrillation coronary artery disease, acute left hip fracture.  Carotid bruit with some carotid stenosis right and left.  Allergic rhinitis.  Hyperlipidemia, hypertension, hyperglycemia.  Macular degeneration.  Otherwise negative is obtains HPI. Blood pressure (!) 121/55, pulse (!) 57, temperature 98 F (36.7 C), temperature source Oral, resp. rate 16, height 5\' 10"  (1.778 m), weight 56.7 kg, SpO2 91 %. Physical Exam  Constitutional: He is oriented to person, place, and time. He appears well-developed and well-nourished.  HENT:  Head: Normocephalic and atraumatic.  Eyes: Pupils are equal, round, and reactive to light.  Neck: No tracheal deviation present. No thyromegaly present.  Cardiovascular:  A fib irregular irregular rate  Respiratory: Effort normal and breath sounds normal. He has no wheezes.  GI: Soft. He exhibits no distension. There is no abdominal tenderness.  Musculoskeletal:     Cervical back: Normal range of motion and neck supple.  Neurological: He is alert and oriented to person, place, and time.  Skin: Skin is warm and dry. No rash noted. No erythema.  Psychiatric: He has a normal mood and affect. His behavior is normal. Thought content normal.     Assessment/Plan: Left femoral neck fracture , displaced. Will require surgery. Eliquis held today, will proceed with left hip surgery Monday.  Procedure discussed with patient risk surgery discussed questions were elicited and answered he understands request to proceed.currently not sure of exact surgery time possibly late morning or else late afternoon.  My cell [phone] Marybelle Killings 03/25/2019, 6:23 PM

## 2019-03-25 NOTE — ED Triage Notes (Addendum)
[facility] transported pt from home to [facility] ED and reports the following:  Pt getting out of bed to go to bathroom, tripped, and fell face first onto carpeted floor. No loss of consciousness, dizziness, or headache. Unremarkable ECG.  Pt said he has sciatic in right leg which might have contributed to fall. He is on relinquish.

## 2019-03-25 NOTE — Consult Note (Addendum)
Reason for Consult:left hip fracture Referring Physician: Courage MD  Jeremy Kelley is an 85 y.o. male.  HPI: 85 year old male states he was walking to his car on around 12 6 when he turned thinking he took a wrong step and had mild pain in his left hip region.  He started having some increased pain the following day which gradually progressed to the point presented to emergency room yesterday on 03/24/2019 with significant increased left hip pain and inability to walk.  X-rays demonstrated displaced subcapital fracture of the left femoral neck.  Patient has some disc degeneration and lumbar spurring and originally his pain was felt to possibly be due to his lumbar region and MRI scan of his back has been pending.  When he fell he hit his hip and x-rays and CT scan of his hip were negative for fracture or subdural or epidural hematoma.  Patient has been on Eliquis with history of atrial fibrillation.  Patient is originally from [place] he worked for [employer] for many years and then retired.  Past history of atrial fibrillation bowel bladder cancer coronary artery disease carotid bruit with 40 to 60% carotid stenosis bilaterally.  Past Medical History:  Diagnosis Date  . Arthritis   . Carotid artery stenosis 11/2008   bilateral 40-59% stenosis  . Colon polyps   . Diverticulosis   . Hepatitis    doesn't know which type 1969  . Hyperlipidemia   . Hypertension   . Numbness    fingertips  . Phimosis   . Prostate cancer ([place]) 2001  . Urothelial carcinoma of left distal ureter [facility])     Past Surgical History:  Procedure Laterality Date  . BIOPSY  10/01/2011   Procedure: BIOPSY;  Surgeon: [name], MD;  Location: [facility];  Service: Urology;;  biopsy of bladder tumor  . BLADDER SUSPENSION  08/18/10   [name]  . CARDIAC CATHETERIZATION N/A 07/29/2015   Procedure: Right/Left Heart Cath and Coronary Angiography;  Surgeon: Peter M Martinique, MD;  Location: [facility];  Service:  Cardiovascular;  Laterality: N/A;  . CATARACT EXTRACTION W/ INTRAOCULAR LENS  IMPLANT, BILATERAL Bilateral   . CYSTOSCOPY W/ RETROGRADES  01/27/2012   Procedure: CYSTOSCOPY WITH RETROGRADE PYELOGRAM;  Surgeon: [name], MD;  Location: [facility];  Service: Urology;  Laterality: Left;  . CYSTOSCOPY WITH RETROGRADE PYELOGRAM, URETEROSCOPY AND STENT PLACEMENT Left 11/24/2012   Procedure: CYSTOSCOPY WITH left RETROGRADE PYELOGRAM, bladder washings, ureteroscopy;  Surgeon: [name], MD;  Location: [facility];  Service: Urology;  Laterality: Left;  . CYSTOSCOPY WITH RETROGRADE PYELOGRAM, URETEROSCOPY AND STENT PLACEMENT Bilateral 11/19/2013   Procedure: CYSTOSCOPY WITH BILATERAL RETROGRADE PYELOGRAM,;  Surgeon: [name], MD;  Location: [facility];  Service: Urology;  Laterality: Bilateral;  . EYE SURGERY  09/24/10   left eye. "Retina surgery"  . INGUINAL HERNIA REPAIR  1960  . PROSTATE SURGERY     s/p laparoscopic prostate surgery  . SKIN BIOPSY  05/20/2017   Superficial and nodular basal cell carcinoma (left temple)    basal cell carcinoma with sclerosis (right temple)  . 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 SKIN CANCER EXCISION     a/p skin cancer right foot-non melanoma  . skin laceration    . TEE WITHOUT CARDIOVERSION N/A 07/29/2015   Procedure: TRANSESOPHAGEAL ECHOCARDIOGRAM (TEE);  Surgeon: Pixie Casino, MD;  Location: [facility] END[facility]COPY;  Service: Cardiovascular;  Laterality: N/A;  cath  to follow  . TRANSURETHRAL RESECTION OF BLADDER TUMOR  10/01/2011   Procedure: TRANSURETHRAL RESECTION OF BLADDER TUMOR (TURBT);  Surgeon: [name], MD;  Location: [facility];  Service: Urology;  Laterality: N/A;  . TRANSURETHRAL RESECTION OF BLADDER TUMOR N/A 11/19/2013   Procedure: TRANSURETHRAL RESECTION OF BLADDER TUMOR (TURBT);  Surgeon: [name], MD;  Location: [facility];  Service: Urology;  Laterality: N/A;  . URETEROSCOPY  01/27/2012   Procedure:  URETEROSCOPY;  Surgeon: [name], MD;  Location: [facility];  Service: Urology;  Laterality: Left;  CYSTO, Left RETROGRADE PYELOGRPHY, LEFT URETEROSCOPY   . URINARY SPHINCTER IMPLANT    . VARICOSE VEIN SURGERY      Family History  Problem Relation Age of Onset  . Colon cancer Neg Hx   . Colon polyps Neg Hx     Social History:  reports that he has never smoked. He has never used smokeless tobacco. He reports current alcohol use of about 14.0 standard drinks of alcohol per week. He reports that he does not use drugs.  Allergies: No Known Allergies  Medications: I have reviewed the patient's current medications.  Results for orders placed or performed during the hospital encounter of 03/25/19 (from the past 48 hour(s))  CBC with Differential     Status: Abnormal   Collection Time: 03/25/19  6:01 AM  Result Value Ref Range   WBC 14.2 (H) 4.0 - 10.5 K/uL   RBC 4.38 4.22 - 5.81 MIL/uL   Hemoglobin 14.3 13.0 - 17.0 g/dL   HCT 43.9 39.0 - 52.0 %   MCV 100.2 (H) 80.0 - 100.0 fL   MCH 32.6 26.0 - 34.0 pg   MCHC 32.6 30.0 - 36.0 g/dL   RDW 13.5 11.5 - 15.5 %   Platelets 237 150 - 400 K/uL   nRBC 0.0 0.0 - 0.2 %   Neutrophils Relative % 88 %   Neutro Abs 12.4 (H) 1.7 - 7.7 K/uL   Lymphocytes Relative 6 %   Lymphs Abs 0.9 0.7 - 4.0 K/uL   Monocytes Relative 5 %   Monocytes Absolute 0.7 0.1 - 1.0 K/uL   Eosinophils Relative 0 %   Eosinophils Absolute 0.0 0.0 - 0.5 K/uL   Basophils Relative 0 %   Basophils Absolute 0.0 0.0 - 0.1 K/uL   Immature Granulocytes 1 %   Abs Immature Granulocytes 0.09 (H) 0.00 - 0.07 K/uL    Comment: Performed at [facility], [place] [address], [place], [place] [id number]  Basic metabolic panel     Status: Abnormal   Collection Time: 03/25/19  6:01 AM  Result Value Ref Range   Sodium 141 135 - 145 mmol/L   Potassium 4.6 3.5 - 5.1 mmol/L   Chloride 96 (L) 98 - 111 mmol/L   CO2 34 (H) 22 - 32 mmol/L   Glucose, Bld 118 (H) 70 - 99 mg/dL   BUN  39 (H) 8 - 23 mg/dL   Creatinine, Ser 1.15 0.61 - 1.24 mg/dL   Calcium 9.1 8.9 - 10.3 mg/dL   GFR calc non Af Amer 56 (L) >60 mL/min   GFR calc Af Amer >60 >60 mL/min   Anion gap 11 5 - 15    Comment: Performed at [facility], [place] [address] [place], [place] [address]  SARS CORONAVIRUS 2 (TAT 6-24 HRS) Nasopharyngeal Nasopharyngeal Swab     Status: None   Collection Time: 03/25/19  6:01 AM   Specimen: Nasopharyngeal Swab  Result Value Ref  Range   SARS Coronavirus 2 NEGATIVE NEGATIVE    Comment: (NOTE) SARS-CoV-2 target nucleic acids are NOT DETECTED. The SARS-CoV-2 RNA is generally detectable in upper and lower respiratory specimens during the acute phase of infection. Negative results do not preclude SARS-CoV-2 infection, do not rule out co-infections with other pathogens, and should not be used as the sole basis for treatment or other patient management decisions. Negative results must be combined with clinical observations, patient history, and epidemiological information. The expected result is Negative. Fact Sheet for Patients: SugarRoll.be Fact Sheet for Healthcare Providers: https://www.woods-mathews.com/ This test is not yet approved or cleared by the [place] FDA and  has been authorized for detection and/or diagnosis of SARS-CoV-2 by FDA under an Emergency Use Authorization (EUA). This EUA will remain  in effect (meaning this test can be used) for the duration of the COVID-19 declaration under Section 56 4(b)(1) of the Act, 21 U.S.C. section 360bbb-3(b)(1), unless the authorization is terminated or revoked sooner. Performed at [facility], [place] [address], [place], [place] [address]     DG Lumbar Spine Complete  Result Date: 03/25/2019 CLINICAL DATA:  Fall, hip pain EXAM: LUMBAR SPINE - COMPLETE 4+ VIEW COMPARISON:  None. FINDINGS: Normal alignment of the vertebral bodies. There is severe joint  space narrowing from L2-S1. No loss vertebral body height. Endplate osteophytosis. IMPRESSION: No acute findings lumbar spine. Multilevel disc osteophytic disease and joint space narrowing. Electronically Signed   By: Suzy Bouchard M.D.   On: 03/25/2019 05:01   CT Head Wo Contrast  Result Date: 03/25/2019 CLINICAL DATA:  Minor trauma EXAM: CT HEAD WITHOUT CONTRAST TECHNIQUE: Contiguous axial images were obtained from the base of the skull through the vertex without intravenous contrast. COMPARISON:  None. FINDINGS: Brain: No acute intracranial hemorrhage. No focal mass lesion. No CT evidence of acute infarction. No midline shift or mass effect. No hydrocephalus. Basilar cisterns are patent. There are periventricular and subcortical white matter hypodensities. Generalized cortical atrophy. Vascular: No hyperdense vessel or unexpected calcification. Skull: Normal. Negative for fracture or focal lesion. Sinuses/Orbits: Paranasal sinuses and mastoid air cells are clear. Orbits are clear. A polypoid mucosal lesion the LEFT ethmoid sinus not changed from comparison MRI. Consistent with mucous retention cyst or polyp. Other: None. IMPRESSION: 1. No acute intracranial findings. 2. Atrophy and white matter microvascular disease. Electronically Signed   By: Suzy Bouchard M.D.   On: 03/25/2019 04:54   DG Chest Port 1 View  Result Date: 03/25/2019 CLINICAL DATA:  Preoperative chest radiograph EXAM: PORTABLE CHEST 1 VIEW COMPARISON:  None. FINDINGS: Moderate cardiomegaly. Lungs are mildly hyperinflated. Pulmonary arteries are prominent. No sizable pleural effusion. No focal airspace consolidation or pulmonary edema. IMPRESSION: 1. No active cardiopulmonary disease. 2. Moderate cardiomegaly. Electronically Signed   By: Ulyses Jarred M.D.   On: 03/25/2019 05:40   DG Knee Complete 4 Views Left  Result Date: 03/25/2019 CLINICAL DATA:  Left hip pain.  Fall. EXAM: LEFT KNEE - COMPLETE 4+ VIEW COMPARISON:  None.  FINDINGS: No evidence of fracture, dislocation, or joint effusion. No evidence of arthropathy or other focal bone abnormality. Soft tissues are unremarkable. IMPRESSION: Negative. Electronically Signed   By: Ulyses Jarred M.D.   On: 03/25/2019 05:09   DG Hip Unilat W or Wo Pelvis 2-3 Views Left  Result Date: 03/25/2019 CLINICAL DATA:  Fall, hip pain EXAM: DG HIP (WITH OR WITHOUT PELVIS) 2-3V LEFT COMPARISON:  None. FINDINGS: Subcapital fracture of the LEFT femoral neck with varus  angulation. No dislocation. There is some proximal migration of the distal fracture segment. IMPRESSION: Subcapital fracture of the LEFT femoral neck. Electronically Signed   By: Suzy Bouchard M.D.   On: 03/25/2019 04:59    ROS positive for atrial fibrillation coronary artery disease, acute left hip fracture.  Carotid bruit with some carotid stenosis right and left.  Allergic rhinitis.  Hyperlipidemia, hypertension, hyperglycemia.  Macular degeneration.  Otherwise negative is obtains HPI. Blood pressure (!) 121/55, pulse (!) 57, temperature 98 F (36.7 C), temperature source Oral, resp. rate 16, height 5\' 10"  (1.778 m), weight 56.7 kg, SpO2 91 %. Physical Exam  Constitutional: He is oriented to person, place, and time. He appears well-developed and well-nourished.  HENT:  Head: Normocephalic and atraumatic.  Eyes: Pupils are equal, round, and reactive to light.  Neck: No tracheal deviation present. No thyromegaly present.  Cardiovascular:  A fib irregular irregular rate  Respiratory: Effort normal and breath sounds normal. He has no wheezes.  GI: Soft. He exhibits no distension. There is no abdominal tenderness.  Musculoskeletal:     Cervical back: Normal range of motion and neck supple.  Neurological: He is alert and oriented to person, place, and time.  Skin: Skin is warm and dry. No rash noted. No erythema.  Psychiatric: He has a normal mood and affect. His behavior is normal. Thought content normal.     Assessment/Plan: Left femoral neck fracture , displaced. Will require surgery. Eliquis held today, will proceed with left hip surgery Monday.  Procedure discussed with patient risk surgery discussed questions were elicited and answered he understands request to proceed.currently not sure of exact surgery time possibly late morning or else late afternoon.  My cell [phone] Marybelle Killings 03/25/2019, 6:23 PM

## 2019-03-25 NOTE — H&P (Signed)
Patient Demographics:    Jeremy Kelley, is a 85 y.o. male  MRN: [id number]   DOB - 02/18/1927  Admit Date - 03/25/2019  Outpatient Primary MD for the patient is [name], NP   Assessment & Plan:    Principal Problem:   Hip fracture [facility]) Active Problems:   Hyperlipidemia   Essential hypertension   Atrial fibrillation ([place])   Pulmonary hypertension ([place])   CAD (coronary artery disease)    1) left hip fracture--- awaiting orthopedic input regarding surgical fixation -Initial fracture may have been on 03/18/2019, with reinjury on 03/24/2019 - -Pain control as ordered -Hold Eliquis to allow for surgical/Ortho intervention-----Last dose of Eliquis was 03/24/2019 in the Evening -Orthopedic consult from Dr. Lorin Mercy appreciated  2)Social/Ethics- d/w  Mr Bo Merino and Mr Emeline Darling wife and Mr Emeline Darling daughter --- who are POA's for patient -Patient is a DNR/DNI -PTA patient lives alone was mostly independent and drove  3) chronic A. Fib--hold Eliquis to allow for left hip surgery, continue metoprolol for rate control --Last dose of Eliquis was 03/24/2019 in the Evening  4) mild dementia--- recently started on Aricept  5)HFpEF and Pulm HTN--Echo from January 2020 with preserved EF of 55 to [id number] with diastolic dysfunction and pulmonary hypertension (PA pressure up to 76 mmHG) -Torsemide is on hold as patient will be n.p.o. for orthopedic procedure  6) leukocytosis--- white count is 14.2, ??  Patient recently treated with prednisone for back pain--suspect this is the reason for the elevated white count however check UA and get blood cultures  With History of - Reviewed by me  Past Medical History:  Diagnosis Date  . Arthritis   . Carotid artery stenosis 11/2008   bilateral 40-59% stenosis  . Colon  polyps   . Diverticulosis   . Hepatitis    doesn't know which type 1969  . Hyperlipidemia   . Hypertension   . Numbness    fingertips  . Phimosis   . Prostate cancer ([place]) 2001  . Urothelial carcinoma of left distal ureter [facility])       Past Surgical History:  Procedure Laterality Date  . BIOPSY  10/01/2011   Procedure: BIOPSY;  Surgeon: [name], MD;  Location: [facility];  Service: Urology;;  biopsy of bladder tumor  . BLADDER SUSPENSION  08/18/10   [name]  . CARDIAC CATHETERIZATION N/A 07/29/2015   Procedure: Right/Left Heart Cath and Coronary Angiography;  Surgeon: Peter M Martinique, MD;  Location: [place] [facility] LAB;  Service: Cardiovascular;  Laterality: N/A;  . CATARACT EXTRACTION W/ INTRAOCULAR LENS  IMPLANT, BILATERAL Bilateral   . CYSTOSCOPY W/ RETROGRADES  01/27/2012   Procedure: CYSTOSCOPY WITH RETROGRADE PYELOGRAM;  Surgeon: [name], MD;  Location: [facility];  Service: Urology;  Laterality: Left;  . CYSTOSCOPY WITH RETROGRADE PYELOGRAM, URETEROSCOPY AND STENT PLACEMENT Left 11/24/2012   Procedure: CYSTOSCOPY WITH left RETROGRADE PYELOGRAM, bladder washings, ureteroscopy;  Surgeon: [name], MD;  Location: [facility];  Service: Urology;  Laterality: Left;  . CYSTOSCOPY WITH RETROGRADE PYELOGRAM, URETEROSCOPY AND STENT PLACEMENT Bilateral 11/19/2013   Procedure: CYSTOSCOPY WITH BILATERAL RETROGRADE PYELOGRAM,;  Surgeon: [name], MD;  Location: [facility];  Service: Urology;  Laterality: Bilateral;  . EYE SURGERY  09/24/10   left eye. "Retina surgery"  . INGUINAL HERNIA REPAIR  1960  . PROSTATE SURGERY     s/p laparoscopic prostate surgery  . SKIN BIOPSY  05/20/2017   Superficial and nodular basal cell carcinoma (left temple)    basal cell carcinoma with sclerosis (right temple)  . 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 SKIN CANCER EXCISION     a/p skin cancer right foot-non melanoma  . skin laceration    . TEE WITHOUT  CARDIOVERSION N/A 07/29/2015   Procedure: TRANSESOPHAGEAL ECHOCARDIOGRAM (TEE);  Surgeon: Pixie Casino, MD;  Location: [facility] ENDOSCOPY;  Service: Cardiovascular;  Laterality: N/A;  cath to follow  . TRANSURETHRAL RESECTION OF BLADDER TUMOR  10/01/2011   Procedure: TRANSURETHRAL RESECTION OF BLADDER TUMOR (TURBT);  Surgeon: [name], MD;  Location: [facility];  Service: Urology;  Laterality: N/A;  . TRANSURETHRAL RESECTION OF BLADDER TUMOR N/A 11/19/2013   Procedure: TRANSURETHRAL RESECTION OF BLADDER TUMOR (TURBT);  Surgeon: [name], MD;  Location: [facility];  Service: Urology;  Laterality: N/A;  . URETEROSCOPY  01/27/2012   Procedure: URETEROSCOPY;  Surgeon: [name], MD;  Location: [facility];  Service: Urology;  Laterality: Left;  CYSTO, Left RETROGRADE PYELOGRPHY, LEFT URETEROSCOPY   . URINARY SPHINCTER IMPLANT    . VARICOSE VEIN SURGERY      Chief Complaint  Patient presents with  . Head Injury      HPI:    Jeremy Kelley  is a 85 y.o. male with past medical history relevant for dementia, diastolic dysfunction CHF, pulmonary hypertension, chronic atrial fibrillation on anticoagulation sciatica and chronic back pain/DDD, history of bladder cancer, history of PAD with 4060% carotid stenosis bilaterally -Initial fracture may have been on 03/18/2019, with reinjury on 03/24/2019 - On 03/18/2019 patient apparently had mild left hip pain after taking a wrong step/twisting while walking to his car, he pain persisted and worsened over the last 24 hours -Patient apparently was going to use the bathroom at home today when he lost his balance and fell--presented by EMS -X-rays in the ED at this time shows displaced subcapital fracture of the left femoral neck  -Last dose of Eliquis was 03/24/2019 in the ED -EKG in the ED with A. fib with heart rate of 62 -Chest x-ray with cardiomegaly otherwise no acute finding -CT head without acute findings -Knee x-rays without acute findings -Lumbar x-rays  without acute findings patient does have multilevel disc osteophytic disease and joint space narrowing -Creatinine is 1.1 -Hemoglobin is 14.3   -Patient has been having increasing back pain prior to this fall, PCP has been treating with prednisone and tramadol -WBC with a white count of 14.2--- currently taking prednisone for back pain    Review of systems:    In addition to the HPI above,   A full Review of  Systems was done, all other systems reviewed are negative except as noted above in HPI , .    Social History:  Reviewed by me    Social History   Tobacco Use  . Smoking status: Never Smoker  . Smokeless tobacco: Never Used  . Tobacco comment: 07/29/2015 "might smoke a cigar once/year"  Substance Use Topics  . Alcohol use: Yes    Alcohol/week: 14.0 standard drinks    Types: 14 Glasses of wine per week    Comment: 07/29/2015 "big glass of wine q night"       Family History :  Reviewed by me    Family History  Problem Relation Age of Onset  . Colon cancer Neg Hx   . Colon polyps Neg Hx     Home Medications:   Prior to Admission medications   Medication Sig Start Date End Date Taking? Authorizing Provider  ELIQUIS 2.5 MG TABS tablet TAKE 1 TABLET BY MOUTH TWICE A DAY Patient taking differently: Take 2.5 mg by mouth 2 (two) times daily.  01/03/19  Yes Nafziger, [name], NP  B Complex-C (B-COMPLEX WITH VITAMIN C) tablet Take 1 tablet by mouth daily. Reported on 07/24/2015 08/18/16   [name], NP  cholecalciferol (VITAMIN D) 1000 units tablet Take 1 tablet (1,000 Units total) by mouth daily. 08/18/16   Nafziger, [name], NP  docusate sodium (COLACE) 100 MG capsule Take 1 capsule (100 mg total) by mouth 2 (two) times daily. 02/24/19   Shawna Clamp, MD  donepezil (ARICEPT) 5 MG tablet Take 1/2 tablet daily for 2 weeks, then increase to 1 tablet daily Patient taking differently: Take 2.5-5 mg by mouth See admin instructions. Take 2.5mg  daily for 2 weeks, then increase to  5mg  daily 02/26/19   Cameron Sprang, MD  fluorometholone (FML) 0.1 % ophthalmic suspension Place 1 drop into both eyes 4 (four) times daily. 10/06/18   [provider]  fluticasone (FLONASE) 50 MCG/ACT nasal spray Place 2 sprays into both nostrils at bedtime. 12/28/18   [provider]  ipratropium (ATROVENT) 0.06 % nasal spray Place 2 sprays into the nose 2 (two) times daily as needed. Nasal drainage 12/28/18   [provider]  KLOR-CON M10 10 MEQ tablet Take 10 mEq by mouth 2 (two) times daily. 02/28/19   [provider]  metoprolol succinate (TOPROL-XL) 25 MG 24 hr tablet TAKE 1 TABLET BY MOUTH EVERYDAY AT BEDTIME Patient taking differently: Take 25 mg by mouth daily.  12/20/18   Nafziger, [name], NP  mirabegron ER (MYRBETRIQ) 50 MG TB24 tablet Take 1 tablet (50 mg total) by mouth daily. 12/14/18   Nafziger, [name], NP  polyethylene glycol (MIRALAX / GLYCOLAX) 17 g packet Take 17 g by mouth daily as needed for mild constipation.     [provider]  Potassium Chloride (KLOR-CON 10 PO) Take 1 tablet by mouth 2 (two) times daily.    [provider]  pravastatin (PRAVACHOL) 20 MG tablet TAKE 1 TABLET BY MOUTH EVERYDAY AT BEDTIME Patient taking differently: Take 20 mg by mouth daily.  01/23/19   Nafziger, [name], NP  predniSONE (DELTASONE) 10 MG tablet 40 mg x 3 days, 20 mg x 3 days, 10 mg x 3 days Patient taking differently: Take 10-40 mg by mouth See admin instructions. 40 mg x 3 days, 20 mg x 3 days, 10 mg x 3 days 03/20/19   [name], NP  torsemide (DEMADEX) 20 MG tablet Take 20 mg by mouth 2 (two) times daily.     [provider]  traMADol (ULTRAM) 50 MG tablet Take 1 tablet (50 mg total) by mouth every 12 (twelve) hours as needed for up to 10 doses. Patient taking differently: Take 50 mg by mouth every 12 (twelve) hours as needed for moderate pain.  03/23/19   [name], NP  Shirley Friar  5 % SOLN Place 1 drop into both eyes 2 (two) times a  day. 08/22/18   [provider]     Allergies:    No Known Allergies   Physical Exam:   Vitals  Blood pressure (!) 121/55, pulse (!) 57, temperature 98 F (36.7 C), temperature source Oral, resp. rate 16, height 5\' 10"  (1.778 m), weight 56.7 kg, SpO2 91 %.  Physical Examination: General appearance - alert, cachectic appearing, and in no distress a Mental status - alert, oriented to person, place, and time,  Eyes - sclera anicteric Neck - supple, no JVD elevation , Chest - clear  to auscultation bilaterally, symmetrical air movement,  Heart - S1 and S2 normal, irregular  Abdomen - soft, nontender, nondistended, no masses or organomegaly Neurological - screening mental status exam normal, neck supple without rigidity, cranial nerves II through XII intact, DTR's normal and symmetric Extremities - no pedal edema noted, intact peripheral pulses  Skin - warm, dry     Data Review:    CBC Recent Labs  Lab 03/25/19 0601  WBC 14.2*  HGB 14.3  HCT 43.9  PLT 237  MCV 100.2*  MCH 32.6  MCHC 32.6  RDW 13.5  LYMPHSABS 0.9  MONOABS 0.7  EOSABS 0.0  BASOSABS 0.0   ------------------------------------------------------------------------------------------------------------------  Chemistries  Recent Labs  Lab 03/25/19 0601  NA 141  K 4.6  CL 96*  CO2 34*  GLUCOSE 118*  BUN 39*  CREATININE 1.15  CALCIUM 9.1   ------------------------------------------------------------------------------------------------------------------ estimated creatinine clearance is 34.2 mL/min (by C-G formula based on SCr of 1.15 mg/dL). ------------------------------------------------------------------------------------------------------------------ No results for input(s): TSH, T4TOTAL, T3FREE, THYROIDAB in the last 72 hours.  Invalid input(s): FREET3   Coagulation profile No results for input(s): INR, PROTIME in the last 168  hours. ------------------------------------------------------------------------------------------------------------------- No results for input(s): DDIMER in the last 72 hours. -------------------------------------------------------------------------------------------------------------------  Cardiac Enzymes No results for input(s): CKMB, TROPONINI, MYOGLOBIN in the last 168 hours.  Invalid input(s): CK ------------------------------------------------------------------------------------------------------------------ No results found for: BNP   ---------------------------------------------------------------------------------------------------------------  Urinalysis    Component Value Date/Time   COLORURINE YELLOW 02/21/2019 1100   APPEARANCEUR CLEAR 02/21/2019 1100   LABSPEC 1.015 02/21/2019 1100   PHURINE 7.0 02/21/2019 1100   GLUCOSEU NEGATIVE 02/21/2019 1100   HGBUR NEGATIVE 02/21/2019 1100   BILIRUBINUR NEGATIVE 02/21/2019 1100   BILIRUBINUR n 07/15/2015 1320   KETONESUR NEGATIVE 02/21/2019 1100   PROTEINUR NEGATIVE 02/21/2019 1100   UROBILINOGEN 0.2 07/15/2015 1320   NITRITE NEGATIVE 02/21/2019 1100   LEUKOCYTESUR NEGATIVE 02/21/2019 1100    ----------------------------------------------------------------------------------------------------------------   Imaging Results:    DG Lumbar Spine Complete  Result Date: 03/25/2019 CLINICAL DATA:  Fall, hip pain EXAM: LUMBAR SPINE - COMPLETE 4+ VIEW COMPARISON:  None. FINDINGS: Normal alignment of the vertebral bodies. There is severe joint space narrowing from L2-S1. No loss vertebral body height. Endplate osteophytosis. IMPRESSION: No acute findings lumbar spine. Multilevel disc osteophytic disease and joint space narrowing. Electronically Signed   By: Suzy Bouchard M.D.   On: 03/25/2019 05:01   CT Head Wo Contrast  Result Date: 03/25/2019 CLINICAL DATA:  Minor trauma EXAM: CT HEAD WITHOUT CONTRAST TECHNIQUE: Contiguous  axial images were obtained from the base of the skull through the vertex without intravenous contrast. COMPARISON:  None. FINDINGS: Brain: No acute intracranial hemorrhage. No focal mass lesion. No CT evidence of acute infarction. No midline shift or mass effect. No hydrocephalus. Basilar cisterns are patent. There are periventricular and subcortical white matter hypodensities. Generalized cortical atrophy.  Vascular: No hyperdense vessel or unexpected calcification. Skull: Normal. Negative for fracture or focal lesion. Sinuses/Orbits: Paranasal sinuses and mastoid air cells are clear. Orbits are clear. A polypoid mucosal lesion the LEFT ethmoid sinus not changed from comparison MRI. Consistent with mucous retention cyst or polyp. Other: None. IMPRESSION: 1. No acute intracranial findings. 2. Atrophy and white matter microvascular disease. Electronically Signed   By: Suzy Bouchard M.D.   On: 03/25/2019 04:54   DG Chest Port 1 View  Result Date: 03/25/2019 CLINICAL DATA:  Preoperative chest radiograph EXAM: PORTABLE CHEST 1 VIEW COMPARISON:  None. FINDINGS: Moderate cardiomegaly. Lungs are mildly hyperinflated. Pulmonary arteries are prominent. No sizable pleural effusion. No focal airspace consolidation or pulmonary edema. IMPRESSION: 1. No active cardiopulmonary disease. 2. Moderate cardiomegaly. Electronically Signed   By: Ulyses Jarred M.D.   On: 03/25/2019 05:40   DG Knee Complete 4 Views Left  Result Date: 03/25/2019 CLINICAL DATA:  Left hip pain.  Fall. EXAM: LEFT KNEE - COMPLETE 4+ VIEW COMPARISON:  None. FINDINGS: No evidence of fracture, dislocation, or joint effusion. No evidence of arthropathy or other focal bone abnormality. Soft tissues are unremarkable. IMPRESSION: Negative. Electronically Signed   By: Ulyses Jarred M.D.   On: 03/25/2019 05:09   DG Hip Unilat W or Wo Pelvis 2-3 Views Left  Result Date: 03/25/2019 CLINICAL DATA:  Fall, hip pain EXAM: DG HIP (WITH OR WITHOUT PELVIS) 2-3V  LEFT COMPARISON:  None. FINDINGS: Subcapital fracture of the LEFT femoral neck with varus angulation. No dislocation. There is some proximal migration of the distal fracture segment. IMPRESSION: Subcapital fracture of the LEFT femoral neck. Electronically Signed   By: Suzy Bouchard M.D.   On: 03/25/2019 04:59    Radiological Exams on Admission: DG Lumbar Spine Complete  Result Date: 03/25/2019 CLINICAL DATA:  Fall, hip pain EXAM: LUMBAR SPINE - COMPLETE 4+ VIEW COMPARISON:  None. FINDINGS: Normal alignment of the vertebral bodies. There is severe joint space narrowing from L2-S1. No loss vertebral body height. Endplate osteophytosis. IMPRESSION: No acute findings lumbar spine. Multilevel disc osteophytic disease and joint space narrowing. Electronically Signed   By: Suzy Bouchard M.D.   On: 03/25/2019 05:01   CT Head Wo Contrast  Result Date: 03/25/2019 CLINICAL DATA:  Minor trauma EXAM: CT HEAD WITHOUT CONTRAST TECHNIQUE: Contiguous axial images were obtained from the base of the skull through the vertex without intravenous contrast. COMPARISON:  None. FINDINGS: Brain: No acute intracranial hemorrhage. No focal mass lesion. No CT evidence of acute infarction. No midline shift or mass effect. No hydrocephalus. Basilar cisterns are patent. There are periventricular and subcortical white matter hypodensities. Generalized cortical atrophy. Vascular: No hyperdense vessel or unexpected calcification. Skull: Normal. Negative for fracture or focal lesion. Sinuses/Orbits: Paranasal sinuses and mastoid air cells are clear. Orbits are clear. A polypoid mucosal lesion the LEFT ethmoid sinus not changed from comparison MRI. Consistent with mucous retention cyst or polyp. Other: None. IMPRESSION: 1. No acute intracranial findings. 2. Atrophy and white matter microvascular disease. Electronically Signed   By: Suzy Bouchard M.D.   On: 03/25/2019 04:54   DG Chest Port 1 View  Result Date:  03/25/2019 CLINICAL DATA:  Preoperative chest radiograph EXAM: PORTABLE CHEST 1 VIEW COMPARISON:  None. FINDINGS: Moderate cardiomegaly. Lungs are mildly hyperinflated. Pulmonary arteries are prominent. No sizable pleural effusion. No focal airspace consolidation or pulmonary edema. IMPRESSION: 1. No active cardiopulmonary disease. 2. Moderate cardiomegaly. Electronically Signed   By: Ulyses Jarred M.D.   On:  03/25/2019 05:40   DG Knee Complete 4 Views Left  Result Date: 03/25/2019 CLINICAL DATA:  Left hip pain.  Fall. EXAM: LEFT KNEE - COMPLETE 4+ VIEW COMPARISON:  None. FINDINGS: No evidence of fracture, dislocation, or joint effusion. No evidence of arthropathy or other focal bone abnormality. Soft tissues are unremarkable. IMPRESSION: Negative. Electronically Signed   By: Ulyses Jarred M.D.   On: 03/25/2019 05:09   DG Hip Unilat W or Wo Pelvis 2-3 Views Left  Result Date: 03/25/2019 CLINICAL DATA:  Fall, hip pain EXAM: DG HIP (WITH OR WITHOUT PELVIS) 2-3V LEFT COMPARISON:  None. FINDINGS: Subcapital fracture of the LEFT femoral neck with varus angulation. No dislocation. There is some proximal migration of the distal fracture segment. IMPRESSION: Subcapital fracture of the LEFT femoral neck. Electronically Signed   By: Suzy Bouchard M.D.   On: 03/25/2019 04:59    DVT Prophylaxis -SCD AM Labs Ordered, also please review Full Orders  Family Communication: Admission, patients condition and plan of care including tests being ordered have been discussed with the patient and POA (Mr Bo Merino and his wife/daughter ) who indicate understanding and agree with the plan   Code Status - Full Code  Likely DC to   SNF Vs HH   Condition   stable  Roxan Hockey M.D on 03/25/2019 at 6:34 PM Go to [email or web address] -  for contact info  [facility] - Office  [phone]

## 2019-03-25 NOTE — ED Notes (Signed)
Patient transported to X-ray 

## 2019-03-26 ENCOUNTER — Encounter (HOSPITAL_COMMUNITY): Payer: Self-pay | Admitting: Family Medicine

## 2019-03-26 ENCOUNTER — Inpatient Hospital Stay (HOSPITAL_COMMUNITY): Payer: Medicare Other | Admitting: Certified Registered Nurse Anesthetist

## 2019-03-26 ENCOUNTER — Encounter (HOSPITAL_COMMUNITY)
Admission: EM | Disposition: A | Discharge status: Skilled Nursing Facility | Payer: Self-pay | Source: Home / Self Care | Attending: Internal Medicine

## 2019-03-26 ENCOUNTER — Inpatient Hospital Stay (HOSPITAL_COMMUNITY): Payer: Medicare Other

## 2019-03-26 HISTORY — PX: HIP ARTHROPLASTY: SHX981

## 2019-03-26 LAB — CBC
HCT: 43.3 % (ref 39.0–52.0)
Hemoglobin: 13.5 g/dL (ref 13.0–17.0)
MCH: 32.3 pg (ref 26.0–34.0)
MCHC: 31.2 g/dL (ref 30.0–36.0)
MCV: 103.6 fL — ABNORMAL HIGH (ref 80.0–100.0)
Platelets: 217 10*3/uL (ref 150–400)
RBC: 4.18 MIL/uL — ABNORMAL LOW (ref 4.22–5.81)
RDW: 13.6 % (ref 11.5–15.5)
WBC: 12.7 10*3/uL — ABNORMAL HIGH (ref 4.0–10.5)
nRBC: 0 % (ref 0.0–0.2)

## 2019-03-26 LAB — SURGICAL PCR SCREEN
MRSA, PCR: NEGATIVE
Staphylococcus aureus: NEGATIVE

## 2019-03-26 SURGERY — HEMIARTHROPLASTY, HIP, DIRECT ANTERIOR APPROACH, FOR FRACTURE
Anesthesia: General | Site: Hip | Laterality: Left

## 2019-03-26 MED ORDER — BACITRACIN ZINC 500 UNIT/GM EX OINT
TOPICAL_OINTMENT | CUTANEOUS | Status: DC | PRN
  Administered 2019-03-26: 1 via TOPICAL

## 2019-03-26 MED ORDER — FLUTICASONE PROPIONATE 50 MCG/ACT NA SUSP
2.0000 | Freq: Every day | NASAL | Status: DC
  Filled 2019-03-26 (×2): qty 16

## 2019-03-26 MED ORDER — POVIDONE-IODINE 10 % EX SWAB: 2.0000 "application " | Freq: Once | CUTANEOUS | Status: DC

## 2019-03-26 MED ORDER — LIDOCAINE 2% (20 MG/ML) 5 ML SYRINGE
INTRAMUSCULAR | Status: AC
  Filled 2019-03-26: qty 5

## 2019-03-26 MED ORDER — PHENYLEPHRINE HCL (PRESSORS) 10 MG/ML IV SOLN
INTRAVENOUS | Status: DC | PRN
  Administered 2019-03-26: 40 ug via INTRAVENOUS
  Administered 2019-03-26: 80 ug via INTRAVENOUS

## 2019-03-26 MED ORDER — MENTHOL 3 MG MT LOZG: 1.0000 | LOZENGE | OROMUCOSAL | Status: DC | PRN

## 2019-03-26 MED ORDER — PROPOFOL 10 MG/ML IV BOLUS
INTRAVENOUS | Status: DC | PRN
  Administered 2019-03-26: 40 ug via INTRAVENOUS

## 2019-03-26 MED ORDER — ONDANSETRON HCL 4 MG/2ML IJ SOLN
INTRAMUSCULAR | Status: DC | PRN
  Administered 2019-03-26: 4 mg via INTRAVENOUS

## 2019-03-26 MED ORDER — ROCURONIUM BROMIDE 10 MG/ML (PF) SYRINGE
PREFILLED_SYRINGE | INTRAVENOUS | Status: AC
  Filled 2019-03-26: qty 10

## 2019-03-26 MED ORDER — PROPOFOL 10 MG/ML IV BOLUS
INTRAVENOUS | Status: AC
  Filled 2019-03-26: qty 20

## 2019-03-26 MED ORDER — IPRATROPIUM BROMIDE 0.06 % NA SOLN
2.0000 | Freq: Two times a day (BID) | NASAL | Status: DC | PRN
  Filled 2019-03-26: qty 15

## 2019-03-26 MED ORDER — SODIUM CHLORIDE 0.45 % IV SOLN
INTRAVENOUS | Status: DC
  Administered 2019-03-26: 17:00:00 via INTRAVENOUS

## 2019-03-26 MED ORDER — FLUOROMETHOLONE 0.1 % OP SUSP: 1.0000 [drp] | Freq: Four times a day (QID) | OPHTHALMIC | Status: DC

## 2019-03-26 MED ORDER — ROCURONIUM BROMIDE 100 MG/10ML IV SOLN
INTRAVENOUS | Status: DC | PRN
  Administered 2019-03-26: 50 mg via INTRAVENOUS

## 2019-03-26 MED ORDER — PHENOL 1.4 % MT LIQD
1.0000 | OROMUCOSAL | Status: DC | PRN
  Filled 2019-03-26: qty 177

## 2019-03-26 MED ORDER — DOCUSATE SODIUM 100 MG PO CAPS
100.0000 mg | ORAL_CAPSULE | Freq: Two times a day (BID) | ORAL | Status: DC
  Administered 2019-03-26 – 2019-03-27 (×2): 100 mg via ORAL
  Filled 2019-03-26 (×3): qty 1

## 2019-03-26 MED ORDER — ONDANSETRON HCL 4 MG/2ML IJ SOLN
INTRAMUSCULAR | Status: AC
  Filled 2019-03-26: qty 2

## 2019-03-26 MED ORDER — METOCLOPRAMIDE HCL 5 MG PO TABS: 5.0000 mg | ORAL_TABLET | Freq: Three times a day (TID) | ORAL | Status: DC | PRN

## 2019-03-26 MED ORDER — LACTATED RINGERS IV SOLN
INTRAVENOUS | Status: DC
  Administered 2019-03-26: 11:00:00 via INTRAVENOUS

## 2019-03-26 MED ORDER — LIDOCAINE HCL (CARDIAC) PF 100 MG/5ML IV SOSY
PREFILLED_SYRINGE | INTRAVENOUS | Status: DC | PRN
  Administered 2019-03-26: 40 mg via INTRATRACHEAL

## 2019-03-26 MED ORDER — HYDROCODONE-ACETAMINOPHEN 5-325 MG PO TABS
1.0000 | ORAL_TABLET | ORAL | Status: DC | PRN
  Filled 2019-03-26: qty 1

## 2019-03-26 MED ORDER — ACETAMINOPHEN 500 MG PO TABS
500.0000 mg | ORAL_TABLET | Freq: Four times a day (QID) | ORAL | Status: AC
  Administered 2019-03-26 – 2019-03-27 (×2): 500 mg via ORAL
  Filled 2019-03-26 (×2): qty 1

## 2019-03-26 MED ORDER — POTASSIUM CHLORIDE CRYS ER 10 MEQ PO TBCR
10.0000 meq | EXTENDED_RELEASE_TABLET | Freq: Two times a day (BID) | ORAL | Status: DC
  Administered 2019-03-26 – 2019-03-27 (×2): 10 meq via ORAL
  Filled 2019-03-26 (×3): qty 1

## 2019-03-26 MED ORDER — ALBUMIN HUMAN 5 % IV SOLN
INTRAVENOUS | Status: DC | PRN
  Administered 2019-03-26: 12:00:00 via INTRAVENOUS

## 2019-03-26 MED ORDER — BACITRACIN ZINC 500 UNIT/GM EX OINT
TOPICAL_OINTMENT | CUTANEOUS | Status: AC
  Filled 2019-03-26: qty 28.35

## 2019-03-26 MED ORDER — FENTANYL CITRATE (PF) 100 MCG/2ML IJ SOLN
INTRAMUSCULAR | Status: AC
  Filled 2019-03-26: qty 2

## 2019-03-26 MED ORDER — STERILE WATER FOR IRRIGATION IR SOLN
Status: DC | PRN
  Administered 2019-03-26: 2000 mL

## 2019-03-26 MED ORDER — MORPHINE SULFATE (PF) 2 MG/ML IV SOLN: 0.5000 mg | INTRAVENOUS | Status: DC | PRN

## 2019-03-26 MED ORDER — BUPIVACAINE HCL (PF) 0.5 % IJ SOLN
INTRAMUSCULAR | Status: AC
  Filled 2019-03-26: qty 30

## 2019-03-26 MED ORDER — ACETAMINOPHEN 325 MG PO TABS: 325.0000 mg | ORAL_TABLET | Freq: Four times a day (QID) | ORAL | Status: DC | PRN

## 2019-03-26 MED ORDER — HYDROCODONE-ACETAMINOPHEN 7.5-325 MG PO TABS: 1.0000 | ORAL_TABLET | ORAL | Status: DC | PRN

## 2019-03-26 MED ORDER — ONDANSETRON HCL 4 MG PO TABS: 4.0000 mg | ORAL_TABLET | Freq: Four times a day (QID) | ORAL | Status: DC | PRN

## 2019-03-26 MED ORDER — FENTANYL CITRATE (PF) 100 MCG/2ML IJ SOLN
INTRAMUSCULAR | Status: DC | PRN
  Administered 2019-03-26: 100 ug via INTRAVENOUS

## 2019-03-26 MED ORDER — SUCCINYLCHOLINE CHLORIDE 200 MG/10ML IV SOSY
PREFILLED_SYRINGE | INTRAVENOUS | Status: AC
  Filled 2019-03-26: qty 10

## 2019-03-26 MED ORDER — TRANEXAMIC ACID-NACL 1000-0.7 MG/100ML-% IV SOLN
1000.0000 mg | INTRAVENOUS | Status: AC
  Administered 2019-03-26: 1000 mg via INTRAVENOUS
  Filled 2019-03-26: qty 100

## 2019-03-26 MED ORDER — LACTATED RINGERS IV SOLN
INTRAVENOUS | Status: DC | PRN
  Administered 2019-03-26: 11:00:00 via INTRAVENOUS

## 2019-03-26 MED ORDER — CEFAZOLIN SODIUM-DEXTROSE 2-4 GM/100ML-% IV SOLN
2.0000 g | INTRAVENOUS | Status: AC
  Administered 2019-03-26: 2 g via INTRAVENOUS
  Filled 2019-03-26: qty 100

## 2019-03-26 MED ORDER — BUPIVACAINE HCL (PF) 0.5 % IJ SOLN
INTRAMUSCULAR | Status: DC | PRN
  Administered 2019-03-26: 15 mL

## 2019-03-26 MED ORDER — 0.9 % SODIUM CHLORIDE (POUR BTL) OPTIME
TOPICAL | Status: DC | PRN
  Administered 2019-03-26: 1000 mL

## 2019-03-26 MED ORDER — ONDANSETRON HCL 4 MG/2ML IJ SOLN: 4.0000 mg | Freq: Four times a day (QID) | INTRAMUSCULAR | Status: DC | PRN

## 2019-03-26 MED ORDER — METOCLOPRAMIDE HCL 5 MG/ML IJ SOLN: 5.0000 mg | Freq: Three times a day (TID) | INTRAMUSCULAR | Status: DC | PRN

## 2019-03-26 SURGICAL SUPPLY — 63 items
APL PRP STRL LF DISP 70% ISPRP (MISCELLANEOUS) ×1
BIPOLAR DEPUY 51 (Hips) ×2 IMPLANT
BIPOLAR DEPUY 51MM (Hips) ×1 IMPLANT
BLADE SAW SAG 73X25 THK (BLADE) ×1
BLADE SAW SGTL 73X25 THK (BLADE) ×2 IMPLANT
BRUSH FEMORAL CANAL (MISCELLANEOUS) IMPLANT
CHLORAPREP W/TINT 26 (MISCELLANEOUS) ×3 IMPLANT
COVER SURGICAL LIGHT HANDLE (MISCELLANEOUS) ×3 IMPLANT
COVER WAND RF STERILE (DRAPES) IMPLANT
DRAPE INCISE IOBAN 66X45 STRL (DRAPES) ×3 IMPLANT
DRAPE ORTHO SPLIT 77X108 STRL (DRAPES) ×6
DRAPE POUCH INSTRU U-SHP 10X18 (DRAPES) ×3 IMPLANT
DRAPE SURG ORHT 6 SPLT 77X108 (DRAPES) ×2 IMPLANT
DRAPE U-SHAPE 47X51 STRL (DRAPES) ×3 IMPLANT
DRAPE WARM FLUID 44X44 (DRAPES) ×3 IMPLANT
DRSG PAD ABDOMINAL 8X10 ST (GAUZE/BANDAGES/DRESSINGS) ×2 IMPLANT
ELECT BLADE TIP CTD 4 INCH (ELECTRODE) ×3 IMPLANT
ELECT REM PT RETURN 15FT ADLT (MISCELLANEOUS) ×3 IMPLANT
EVACUATOR 1/8 PVC DRAIN (DRAIN) ×3 IMPLANT
FACESHIELD WRAPAROUND (MASK) ×15 IMPLANT
FACESHIELD WRAPAROUND OR TEAM (MASK) ×5 IMPLANT
GAUZE SPONGE 4X4 12PLY STRL (GAUZE/BANDAGES/DRESSINGS) ×4 IMPLANT
GAUZE XEROFORM 5X9 LF (GAUZE/BANDAGES/DRESSINGS) ×3 IMPLANT
GLOVE BIOGEL PI IND STRL 8 (GLOVE) ×1 IMPLANT
GLOVE BIOGEL PI INDICATOR 8 (GLOVE) ×2
GLOVE ORTHO TXT STRL SZ7.5 (GLOVE) ×3 IMPLANT
GOWN STRL REUS W/TWL LRG LVL3 (GOWN DISPOSABLE) ×3 IMPLANT
HANDPIECE INTERPULSE COAX TIP (DISPOSABLE)
HEAD BIPOLAR DEPUY 51 (Hips) IMPLANT
HEAD FEM STD 28X+1.5 STRL (Hips) ×2 IMPLANT
IMMOBILIZER KNEE 20 (SOFTGOODS) ×3 IMPLANT
IMMOBILIZER KNEE 20 THIGH 36 (SOFTGOODS) ×1 IMPLANT
IMMOBILIZER KNEE 22 UNIV (SOFTGOODS) ×2 IMPLANT
KIT BASIN OR (CUSTOM PROCEDURE TRAY) ×3 IMPLANT
KIT TURNOVER KIT A (KITS) IMPLANT
NDL MAYO CATGUT SZ4 TPR NDL (NEEDLE) ×1 IMPLANT
NEEDLE HYPO 22GX1.5 SAFETY (NEEDLE) IMPLANT
NEEDLE MAYO CATGUT SZ4 (NEEDLE) ×3 IMPLANT
NS IRRIG 1000ML POUR BTL (IV SOLUTION) ×6 IMPLANT
PACK TOTAL JOINT (CUSTOM PROCEDURE TRAY) ×3 IMPLANT
PASSER SUT SWANSON 36MM LOOP (INSTRUMENTS) ×3 IMPLANT
PENCIL SMOKE EVACUATOR (MISCELLANEOUS) IMPLANT
PROTECTOR NERVE ULNAR (MISCELLANEOUS) ×3 IMPLANT
SET HNDPC FAN SPRY TIP SCT (DISPOSABLE) IMPLANT
SPONGE LAP 18X18 RF (DISPOSABLE) IMPLANT
STAPLER VISISTAT 35W (STAPLE) ×3 IMPLANT
STEM SUMMIT BASIC PRESSFIT SZ4 (Hips) ×2 IMPLANT
SUCTION FRAZIER HANDLE 12FR (TUBING) ×2
SUCTION TUBE FRAZIER 12FR DISP (TUBING) ×1 IMPLANT
SUT ETHIBOND NAB CT1 #1 30IN (SUTURE) ×12 IMPLANT
SUT VIC AB 0 CT1 27 (SUTURE) ×3
SUT VIC AB 0 CT1 27XBRD ANTBC (SUTURE) ×1 IMPLANT
SUT VIC AB 1 CT1 27 (SUTURE)
SUT VIC AB 1 CT1 27XBRD ANTBC (SUTURE) IMPLANT
SUT VIC AB 1 CTX 36 (SUTURE)
SUT VIC AB 1 CTX36XBRD ANBCTR (SUTURE) IMPLANT
SUT VIC AB 2-0 CT1 36 (SUTURE) ×9 IMPLANT
SYR 30ML LL (SYRINGE) IMPLANT
TAPE CLOTH SURG 4X10 WHT LF (GAUZE/BANDAGES/DRESSINGS) ×2 IMPLANT
TOWEL OR 17X26 10 PK STRL BLUE (TOWEL DISPOSABLE) ×6 IMPLANT
TOWER CARTRIDGE SMART MIX (DISPOSABLE) IMPLANT
TRAY FOLEY MTR SLVR 16FR STAT (SET/KITS/TRAYS/PACK) ×3 IMPLANT
WATER STERILE IRR 1000ML POUR (IV SOLUTION) ×3 IMPLANT

## 2019-03-26 NOTE — Transfer of Care (Signed)
Immediate Anesthesia Transfer of Care Note  Patient: Jeremy Kelley  Procedure(s) Performed: ARTHROPLASTY BIPOLAR HIP (HEMIARTHROPLASTY) (Left Hip)  Patient Location: PACU  Anesthesia Type:General  Level of Consciousness: awake and drowsy  Airway & Oxygen Therapy: Patient Spontanous Breathing and Patient connected to face mask oxygen  Post-op Assessment: Report given to RN and Post -op Vital signs reviewed and stable  Post vital signs: Reviewed and stable  Last Vitals:  Vitals Value Taken Time  BP 176/105 03/26/19 1242  Temp    Pulse 79 03/26/19 1244  Resp 17 03/26/19 1246  SpO2 100 % 03/26/19 1244  Vitals shown include unvalidated device data.  Last Pain:  Vitals:   03/26/19 1034  TempSrc: Oral  PainSc:          Complications: No apparent anesthesia complications

## 2019-03-26 NOTE — Progress Notes (Addendum)
PROGRESS NOTE    Jeremy Kelley  [id number] DOB: 31-Oct-1926 DOA: 03/25/2019 PCP: [name], NP    Brief Narrative:  85 year old Caucasian male with history of chronic A. fib on anticoagulation, diastolic heart failure admitted with left hip fracture requiring surgical intervention  Assessment & Plan:   Principal Problem:   Hip fracture ([place]) Active Problems:   Hyperlipidemia   Essential hypertension   Atrial fibrillation ([place])   Pulmonary hypertension ([place])   CAD (coronary artery disease)  Left hip fracture S/p hemiarthroplasty by Dr. Lorin Mercy  Orthopedics on board-we will resume Eliquis tomorrow further recommendation Pain control PT/OT  Chronic atrial fibrillation Eliquis currently on hold.  Will restart Eliquis tomorrow Continue metoprolol for rate control  Mild dementia Continue Aricept  HFpEF Prior history of diastolic dysfunction elevated pulmonary artery pressures Torsemide is on hold given he was n.p.o. We will restart tomorrow as blood pressure allows  Leukocytosis Improving.  Likely reactive. We will follow up on cultures ordered on admission.   DVT prophylaxis: None:Eliquis on hold due to surgery.  Restart tomorrow. Code Status: DNR  Family Communication:  Disposition Plan: We will discuss with patient and family (may need short-term rehab)   Consultants:   Orthopedics  Procedures:  Left hip hemiarthroplasty  Antimicrobials:   None   Subjective: No acute events overnight.  Patient was being taken to surgery when I went to see him.  He reported no complaints.  Family at bedside talking to him prior to surgery.  Objective: Vitals:   03/26/19 1330 03/26/19 1345 03/26/19 1401 03/26/19 1508  BP: (!) 150/94 (!) 166/66 (!) 132/59 120/60  Pulse: 89 82 85 66  Resp: 16 16 16 16   Temp:  98.7 F (37.1 C) 98.2 F (36.8 C) 98.7 F (37.1 C)  TempSrc:   Oral Oral  SpO2: 95% 95% 98% 100%  Weight:      Height:        Intake/Output  Summary (Last 24 hours) at 03/26/2019 1645 Last data filed at 03/26/2019 1249 Gross per 24 hour  Intake 1500.07 ml  Output 1300 ml  Net 200.07 ml   Filed Weights   03/25/19 0338  Weight: 56.7 kg    Examination:  General exam: Appears calm and comfortable  Respiratory system: Respiratory effort normal. Cardiovascular system: S1 & S2 heard, RRR.  Gastrointestinal system: Abdomen is nondistended, soft and nontender.  Central nervous system: Alert and oriented. No focal neurological deficits. Extremities: strength in LE not assessed  Skin: No rashes, lesions or ulcers Psychiatry: Judgement and insight appear normal. Mood & affect appropriate.   Data Reviewed: I have personally reviewed following labs and imaging studies  CBC: Recent Labs  Lab 03/25/19 0601 03/26/19 0449  WBC 14.2* 12.7*  NEUTROABS 12.4*  --   HGB 14.3 13.5  HCT 43.9 43.3  MCV 100.2* 103.6*  PLT 237 [id number]   Basic Metabolic Panel: Recent Labs  Lab 03/25/19 0601  NA 141  K 4.6  CL 96*  CO2 34*  GLUCOSE 118*  BUN 39*  CREATININE 1.15  CALCIUM 9.1   GFR: Estimated Creatinine Clearance: 34.2 mL/min (by C-G formula based on SCr of 1.15 mg/dL). Liver Function Tests: No results for input(s): AST, ALT, ALKPHOS, BILITOT, PROT, ALBUMIN in the last 168 hours. No results for input(s): LIPASE, AMYLASE in the last 168 hours. No results for input(s): AMMONIA in the last 168 hours. Coagulation Profile: No results for input(s): INR, PROTIME in the last 168 hours. Cardiac Enzymes: No results for  input(s): CKTOTAL, CKMB, CKMBINDEX, TROPONINI in the last 168 hours. BNP (last 3 results) Recent Labs    08/31/18 1247  PROBNP 459.0*   HbA1C: No results for input(s): HGBA1C in the last 72 hours. CBG: No results for input(s): GLUCAP in the last 168 hours. Lipid Profile: No results for input(s): CHOL, HDL, LDLCALC, TRIG, CHOLHDL, LDLDIRECT in the last 72 hours. Thyroid Function Tests: No results for input(s):  TSH, T4TOTAL, FREET4, T3FREE, THYROIDAB in the last 72 hours. Anemia Panel: No results for input(s): VITAMINB12, FOLATE, FERRITIN, TIBC, IRON, RETICCTPCT in the last 72 hours. Sepsis Labs: No results for input(s): PROCALCITON, LATICACIDVEN in the last 168 hours.  Recent Results (from the past 240 hour(s))  SARS CORONAVIRUS 2 (TAT 6-24 HRS) Nasopharyngeal Nasopharyngeal Swab     Status: None   Collection Time: 03/25/19  6:01 AM   Specimen: Nasopharyngeal Swab  Result Value Ref Range Status   SARS Coronavirus 2 NEGATIVE NEGATIVE Final    Comment: (NOTE) SARS-CoV-2 target nucleic acids are NOT DETECTED. The SARS-CoV-2 RNA is generally detectable in upper and lower respiratory specimens during the acute phase of infection. Negative results do not preclude SARS-CoV-2 infection, do not rule out co-infections with other pathogens, and should not be used as the sole basis for treatment or other patient management decisions. Negative results must be combined with clinical observations, patient history, and epidemiological information. The expected result is Negative. Fact Sheet for Patients: SugarRoll.be Fact Sheet for Healthcare Providers: https://www.woods-mathews.com/ This test is not yet approved or cleared by the [place] FDA and  has been authorized for detection and/or diagnosis of SARS-CoV-2 by FDA under an Emergency Use Authorization (EUA). This EUA will remain  in effect (meaning this test can be used) for the duration of the COVID-19 declaration under Section 56 4(b)(1) of the Act, 21 U.S.C. section 360bbb-3(b)(1), unless the authorization is terminated or revoked sooner. Performed at [facility], [place] [address], [place], [place] [address]   Surgical pcr screen     Status: None   Collection Time: 03/26/19  7:13 AM  Result Value Ref Range Status   MRSA, PCR NEGATIVE NEGATIVE Final   Staphylococcus aureus NEGATIVE NEGATIVE  Final    Comment: (NOTE) The Xpert SA Assay (FDA approved for NASAL specimens in patients 20 years of age and older), is one component of a comprehensive surveillance program. It is not intended to diagnose infection nor to guide or monitor treatment. Performed at [facility], [place] [address] [place], [place] [address]          Radiology Studies: DG Lumbar Spine Complete  Result Date: 03/25/2019 CLINICAL DATA:  Fall, hip pain EXAM: LUMBAR SPINE - COMPLETE 4+ VIEW COMPARISON:  None. FINDINGS: Normal alignment of the vertebral bodies. There is severe joint space narrowing from L2-S1. No loss vertebral body height. Endplate osteophytosis. IMPRESSION: No acute findings lumbar spine. Multilevel disc osteophytic disease and joint space narrowing. Electronically Signed   By: Suzy Bouchard M.D.   On: 03/25/2019 05:01   CT Head Wo Contrast  Result Date: 03/25/2019 CLINICAL DATA:  Minor trauma EXAM: CT HEAD WITHOUT CONTRAST TECHNIQUE: Contiguous axial images were obtained from the base of the skull through the vertex without intravenous contrast. COMPARISON:  None. FINDINGS: Brain: No acute intracranial hemorrhage. No focal mass lesion. No CT evidence of acute infarction. No midline shift or mass effect. No hydrocephalus. Basilar cisterns are patent. There are periventricular and subcortical white matter hypodensities. Generalized cortical atrophy. Vascular:  No hyperdense vessel or unexpected calcification. Skull: Normal. Negative for fracture or focal lesion. Sinuses/Orbits: Paranasal sinuses and mastoid air cells are clear. Orbits are clear. A polypoid mucosal lesion the LEFT ethmoid sinus not changed from comparison MRI. Consistent with mucous retention cyst or polyp. Other: None. IMPRESSION: 1. No acute intracranial findings. 2. Atrophy and white matter microvascular disease. Electronically Signed   By: Suzy Bouchard M.D.   On: 03/25/2019 04:54   Pelvis  Portable  Result Date: 03/26/2019 CLINICAL DATA:  Status post left hip replacement. EXAM: PORTABLE PELVIS 1-2 VIEWS COMPARISON:  None. FINDINGS: Left hip prosthesis in satisfactory position and alignment. No fracture or dislocation seen. The bones are diffusely osteopenic. IMPRESSION: Satisfactory appearance of a left hip prosthesis. Electronically Signed   By: Claudie Revering M.D.   On: 03/26/2019 13:35   DG Chest Port 1 View  Result Date: 03/25/2019 CLINICAL DATA:  Preoperative chest radiograph EXAM: PORTABLE CHEST 1 VIEW COMPARISON:  None. FINDINGS: Moderate cardiomegaly. Lungs are mildly hyperinflated. Pulmonary arteries are prominent. No sizable pleural effusion. No focal airspace consolidation or pulmonary edema. IMPRESSION: 1. No active cardiopulmonary disease. 2. Moderate cardiomegaly. Electronically Signed   By: Ulyses Jarred M.D.   On: 03/25/2019 05:40   DG Knee Complete 4 Views Left  Result Date: 03/25/2019 CLINICAL DATA:  Left hip pain.  Fall. EXAM: LEFT KNEE - COMPLETE 4+ VIEW COMPARISON:  None. FINDINGS: No evidence of fracture, dislocation, or joint effusion. No evidence of arthropathy or other focal bone abnormality. Soft tissues are unremarkable. IMPRESSION: Negative. Electronically Signed   By: Ulyses Jarred M.D.   On: 03/25/2019 05:09   DG Hip Unilat W or Wo Pelvis 2-3 Views Left  Result Date: 03/25/2019 CLINICAL DATA:  Fall, hip pain EXAM: DG HIP (WITH OR WITHOUT PELVIS) 2-3V LEFT COMPARISON:  None. FINDINGS: Subcapital fracture of the LEFT femoral neck with varus angulation. No dislocation. There is some proximal migration of the distal fracture segment. IMPRESSION: Subcapital fracture of the LEFT femoral neck. Electronically Signed   By: Suzy Bouchard M.D.   On: 03/25/2019 04:59        Scheduled Meds: . acetaminophen  500 mg Oral Q6H  . B-complex with vitamin C  1 tablet Oral Daily  . Chlorhexidine Gluconate Cloth  6 each Topical Daily  . cholecalciferol  1,000  Units Oral Daily  . docusate sodium  100 mg Oral BID  . donepezil  5 mg Oral QHS  . fluorometholone  1 drop Both Eyes QID  . fluticasone  2 spray Each Nare QHS  . Lifitegrast  1 drop Both Eyes QHS  . methocarbamol  750 mg Oral TID  . metoprolol succinate  25 mg Oral Daily  . mirabegron ER  50 mg Oral Daily  . potassium chloride  10 mEq Oral BID  . pravastatin  20 mg Oral Daily  . senna-docusate  2 tablet Oral BID  . sodium chloride flush  3 mL Intravenous Q12H   Continuous Infusions: . sodium chloride    . sodium chloride    . dextrose 5 % and 0.45% NaCl 50 mL/hr at 03/26/19 0300     LOS: 1 day    Time spent: Spent more than 30 minutes in coordinating care for this patient including bedside patient care.   Lucky Cowboy, MD [facility] If 7PM-7AM, please contact night-coverage 03/26/2019, 4:45 PM

## 2019-03-26 NOTE — Anesthesia Procedure Notes (Signed)
Procedure Name: Intubation Date/Time: 03/26/2019 11:39 AM Performed by: [place] Ashkan Chamberland C, CRNA Pre-anesthesia Checklist: Patient identified, Emergency Drugs available, Suction available and Patient being monitored Patient Re-evaluated:Patient Re-evaluated prior to induction Oxygen Delivery Method: Circle system utilized Preoxygenation: Pre-oxygenation with 100% oxygen Induction Type: IV induction Ventilation: Mask ventilation without difficulty Laryngoscope Size: Mac and 4 Grade View: Grade I Tube type: Oral Tube size: 7.5 mm Number of attempts: 1 Airway Equipment and Method: Stylet and Oral airway Placement Confirmation: ETT inserted through vocal cords under direct vision,  positive ETCO2 and breath sounds checked- equal and bilateral Tube secured with: Tape Dental Injury: Teeth and Oropharynx as per pre-operative assessment

## 2019-03-26 NOTE — Anesthesia Preprocedure Evaluation (Addendum)
Anesthesia Evaluation  Patient identified by MRN, date of birth, ID band Patient awake    Reviewed: Allergy & Precautions, NPO status , Patient's Chart, lab work & pertinent test results, reviewed documented beta blocker date and time   History of Anesthesia Complications Negative for: history of anesthetic complications  Airway Mallampati: II  TM Distance: >3 FB Neck ROM: Full    Dental  (+) Dental Advisory Given   Pulmonary neg recent URI,    breath sounds clear to auscultation       Cardiovascular hypertension, Pt. on medications and Pt. on home beta blockers (-) angina+ CAD  (-) Past MI + Valvular Problems/Murmurs AI  Rhythm:Regular + Diastolic murmurs    Neuro/Psych negative psych ROS   GI/Hepatic negative GI ROS, (+) Hepatitis -  Endo/Other  negative endocrine ROS  Renal/GU Renal InsufficiencyRenal disease     Musculoskeletal  (+) Arthritis , Left femoral neck fracture   Abdominal   Peds  Hematology  (+) Blood dyscrasia, , eliquis last dose 12/12   Anesthesia Other Findings   Reproductive/Obstetrics                            Anesthesia Physical Anesthesia Plan  ASA: III  Anesthesia Plan: General   Post-op Pain Management:    Induction: Intravenous  PONV Risk Score and Plan: 2 and Ondansetron and Dexamethasone  Airway Management Planned: Oral ETT  Additional Equipment: None  Intra-op Plan:   Post-operative Plan: Extubation in OR  Informed Consent: I have reviewed the patients History and Physical, chart, labs and discussed the procedure including the risks, benefits and alternatives for the proposed anesthesia with the patient or authorized representative who has indicated his/her understanding and acceptance.     Dental advisory given  Plan Discussed with: CRNA and Surgeon  Anesthesia Plan Comments:         Anesthesia Quick Evaluation

## 2019-03-26 NOTE — Op Note (Signed)
Preop diagnosis: Left displaced femoral neck fracture  Postop diagnosis: Same  Procedure: Left hip bipolar hemiarthroplasty for femoral neck fracture.  Surgeon: Rodell Perna, MD  Anesthesia: General +15 cc Marcaine skin local  Drains: None  Implants:Depuy Summit basic press-fit size 4 stem.  Bipolar 51 mm ball.  +1.5 mm neck length.  Procedure after standard prepping and draping with patient placed in lateral position axillary roll Foley catheter already been placed and preoperative Ancef was given.  Hip was prepped with ChloraPrep usual split sheets drapes impervious stockinette Coban sterile skin marker and Betadine Steri-Drape x2 was used to seal the skin.  Timeout procedure was completed.  Ancef was given prophylactically.  Posterior approach was made tensor fascia was split gluteus maximus was split in line with the fibers.  Piriformis tagged cut posterior capsule was opened immediate hemarthrosis evacuated.  Patient had a high neck fracture and neck was cut 1 fingerbreadth above the lesser trochanter initially was cut a little bit long and as we progressed with came back and recut it 11 mm above the lesser trochanter.  Head was removed sized at 51.  Sequential  On the femoral side up to the size 4 Summit basic stem which gave excellent tight fit.  Trials were placed initially patient seemed a little bit long and tight and that is when we recut the neck with 11 mm neck cut.  Repeat broaching followed by placement of the permanent stem permanent ball with restoration of leg length good stability flexion 90 internal rotation 80 degrees with no instability.  There was trace shock good stability with external rotation.  Sciatic nerve was preserved during no reduction and dislocation each time.  Cup and socket was checked with no arthritic changes in the socket and care was taken to make sure all pieces of bone were removed from the socket before reducing the hip.  Irrigation standard layered closure  piriformis repaired to gluteus medius tensor fascia gluteus maximus repaired with #1 Ethibond suture 2-0 Vicryl subtenons tissue skin staple closure Marcaine infiltration 15 cc postop dressing and knee immobilizer.  Patient tolerated procedure well.

## 2019-03-26 NOTE — Interval H&P Note (Signed)
History and Physical Interval Note:  03/26/2019 10:50 AM  Jeremy Kelley  has presented today for surgery, with the diagnosis of Left femoral neck fracture.  The various methods of treatment have been discussed with the patient and family. After consideration of risks, benefits and other options for treatment, the patient has consented to  Procedure(s) with comments: ARTHROPLASTY BIPOLAR HIP (HEMIARTHROPLASTY) (Left) - Depuy press fit, lateral position as a surgical intervention.  The patient's history has been reviewed, patient examined, no change in status, stable for surgery.  I have reviewed the patient's chart and labs.  Questions were answered to the patient's satisfaction.     Jeremy Kelley

## 2019-03-27 ENCOUNTER — Encounter: Payer: Self-pay | Admitting: *Deleted

## 2019-03-27 DIAGNOSIS — Z96649 Presence of unspecified artificial hip joint: Secondary | ICD-10-CM

## 2019-03-27 DIAGNOSIS — I482 Chronic atrial fibrillation, unspecified: Secondary | ICD-10-CM

## 2019-03-27 LAB — BASIC METABOLIC PANEL
Anion gap: 8 (ref 5–15)
BUN: 27 mg/dL — ABNORMAL HIGH (ref 8–23)
CO2: 29 mmol/L (ref 22–32)
Calcium: 8.8 mg/dL — ABNORMAL LOW (ref 8.9–10.3)
Chloride: 99 mmol/L (ref 98–111)
Creatinine, Ser: 0.99 mg/dL (ref 0.61–1.24)
GFR calc Af Amer: >60 mL/min (ref >60)
GFR calc non Af Amer: >60 mL/min (ref >60)
Glucose, Bld: 112 mg/dL — ABNORMAL HIGH (ref 70–99)
Potassium: 4.1 mmol/L (ref 3.5–5.1)
Sodium: 136 mmol/L (ref 135–145)

## 2019-03-27 LAB — CBC
HCT: 38.8 % — ABNORMAL LOW (ref 39.0–52.0)
Hemoglobin: 12.3 g/dL — ABNORMAL LOW (ref 13.0–17.0)
MCH: 32.2 pg (ref 26.0–34.0)
MCHC: 31.7 g/dL (ref 30.0–36.0)
MCV: 101.6 fL — ABNORMAL HIGH (ref 80.0–100.0)
Platelets: 186 10*3/uL (ref 150–400)
RBC: 3.82 MIL/uL — ABNORMAL LOW (ref 4.22–5.81)
RDW: 13.6 % (ref 11.5–15.5)
WBC: 15.2 10*3/uL — ABNORMAL HIGH (ref 4.0–10.5)
nRBC: 0 % (ref 0.0–0.2)

## 2019-03-27 MED ORDER — LABETALOL HCL 5 MG/ML IV SOLN
5.0000 mg | INTRAVENOUS | Status: DC | PRN
  Filled 2019-03-27: qty 4

## 2019-03-27 MED ORDER — APIXABAN 2.5 MG PO TABS
2.5000 mg | ORAL_TABLET | Freq: Two times a day (BID) | ORAL | Status: DC
  Administered 2019-03-27 – 2019-03-30 (×7): 2.5 mg via ORAL
  Filled 2019-03-27 (×7): qty 1

## 2019-03-27 NOTE — Progress Notes (Signed)
Subjective: Doing well.  Pain controlled left hip.     Objective: Vital signs in last 24 hours: Temp:  [97.8 F (36.6 C)-99.8 F (37.7 C)] 98.2 F (36.8 C) (12/15 0238) Pulse Rate:  [66-89] 71 (12/15 0238) Resp:  [15-20] 18 (12/15 0238) BP: (113-191)/(57-105) 113/57 (12/15 0238) SpO2:  [93 %-100 %] 97 % (12/15 0238) Weight:  [54.6 kg] 54.6 kg (12/15 0500)  Intake/Output from previous day: 12/14 0701 - 12/15 0700 In: 2817.1 [P.O.:920; I.V.:1447.1; IV Piggyback:450] Out: 1450 [Urine:1300; Blood:150] Intake/Output this shift: Total I/O In: 120 [P.O.:120] Out: -   Recent Labs    03/25/19 0601 03/26/19 0449 03/27/19 0459  HGB 14.3 13.5 12.3*   Recent Labs    03/26/19 0449 03/27/19 0459  WBC 12.7* 15.2*  RBC 4.18* 3.82*  HCT 43.3 38.8*  PLT 217 186   Recent Labs    03/25/19 0601 03/27/19 0459  NA 141 136  K 4.6 4.1  CL 96* 99  CO2 34* 29  BUN 39* 27*  CREATININE 1.15 0.99  GLUCOSE 118* 112*  CALCIUM 9.1 8.8*   No results for input(s): LABPT, INR in the last 72 hours.  Exam: Very pleasant elderly male.  NAD. Left hip dressing C/D/I.  NVI.      Assessment/Plan: Continue present care.  Restarting Eliquis today.  Up with PT.      Benjiman Core 03/27/2019, 9:28 AM

## 2019-03-27 NOTE — Progress Notes (Signed)
PROGRESS NOTE    Jeremy Kelley  [id number] DOB: 08-22-1926 DOA: 03/25/2019 PCP: [name], NP    Brief Narrative:  85 year old Caucasian male with history of chronic A. fib on anticoagulation, diastolic heart failure admitted with left hip fracture requiring surgical intervention  Assessment & Plan:   Principal Problem:   Hip fracture [facility]) Active Problems:   Hyperlipidemia   Essential hypertension   Atrial fibrillation ([place])   Pulmonary hypertension ([place])   CAD (coronary artery disease)  Left hip fracture s/p hemiarthroplasty S/p hemiarthroplasty by Dr. Lorin Mercy  Orthopedics on board-Eliquis resumed Pain control PT/OT recommending SNF placement Case management consulted  Chronic atrial fibrillation Eliquis restarted 03/27/2019.  We will get H&H tomorrow morning Continue metoprolol for rate control  Mild dementia Continue Aricept  HFpEF Prior history of diastolic dysfunction elevated pulmonary artery pressures We'll hold off on torsemide today as he looks euvolemic. Plan to restart torsemide tomorrow  Leukocytosis Improving.  Likely reactive. Monitor for other signs of infection   DVT prophylaxis: [id number] Code Status: DNR  Family Communication:  Disposition Plan: PT/OT recommending SNF placement, case management consulted to assist with disposition   Consultants:   Orthopedics  Procedures:  Left hip hemiarthroplasty  Antimicrobials:   None   Subjective: No acute events overnight.  Patient reports doing better today.  Denies any shortness of breath, chest pain, abdominal pain.  Worked with PT/OT today who recommends SNF.  Pain controlled.  Objective: Vitals:   03/26/19 2051 03/27/19 0238 03/27/19 0500 03/27/19 1327  BP: (!) 113/57 (!) 113/57  (!) 134/58  Pulse: 69 71  79  Resp: 18 18  16   Temp: 99.8 F (37.7 C) 98.2 F (36.8 C)  97.6 F (36.4 C)  TempSrc: Oral Oral  Oral  SpO2: 97% 97%  96%  Weight:   54.6 kg   Height:          Intake/Output Summary (Last 24 hours) at 03/27/2019 1722 Last data filed at 03/27/2019 1400 Gross per 24 hour  Intake 2306.41 ml  Output 1500 ml  Net 806.41 ml   Filed Weights   03/25/19 0338 03/27/19 0500  Weight: 56.7 kg 54.6 kg    Examination:  General exam: Appears calm and comfortable  Respiratory system: Respiratory effort normal. Cardiovascular system: S1 & S2 heard, RRR.  Gastrointestinal system: Abdomen is nondistended, soft and nontender.  Central nervous system: Alert and oriented. No focal neurological deficits. Extremities: strength in LE not assessed  Skin: No rashes, lesions or ulcers Psychiatry: Judgement and insight appear normal. Mood & affect appropriate.   Data Reviewed: I have personally reviewed following labs and imaging studies  CBC: Recent Labs  Lab 03/25/19 0601 03/26/19 0449 03/27/19 0459  WBC 14.2* 12.7* 15.2*  NEUTROABS 12.4*  --   --   HGB 14.3 13.5 12.3*  HCT 43.9 43.3 38.8*  MCV 100.2* 103.6* 101.6*  PLT 237 217 [id number]   Basic Metabolic Panel: Recent Labs  Lab 03/25/19 0601 03/27/19 0459  NA 141 136  K 4.6 4.1  CL 96* 99  CO2 34* 29  GLUCOSE 118* 112*  BUN 39* 27*  CREATININE 1.15 0.99  CALCIUM 9.1 8.8*   GFR: Estimated Creatinine Clearance: 38.3 mL/min (by C-G formula based on SCr of 0.99 mg/dL). Liver Function Tests: No results for input(s): AST, ALT, ALKPHOS, BILITOT, PROT, ALBUMIN in the last 168 hours. No results for input(s): LIPASE, AMYLASE in the last 168 hours. No results for input(s): AMMONIA in the last 168 hours. Coagulation  Profile: No results for input(s): INR, PROTIME in the last 168 hours. Cardiac Enzymes: No results for input(s): CKTOTAL, CKMB, CKMBINDEX, TROPONINI in the last 168 hours. BNP (last 3 results) Recent Labs    08/31/18 1247  PROBNP 459.0*   HbA1C: No results for input(s): HGBA1C in the last 72 hours. CBG: No results for input(s): GLUCAP in the last 168 hours. Lipid Profile: No  results for input(s): CHOL, HDL, LDLCALC, TRIG, CHOLHDL, LDLDIRECT in the last 72 hours. Thyroid Function Tests: No results for input(s): TSH, T4TOTAL, FREET4, T3FREE, THYROIDAB in the last 72 hours. Anemia Panel: No results for input(s): VITAMINB12, FOLATE, FERRITIN, TIBC, IRON, RETICCTPCT in the last 72 hours. Sepsis Labs: No results for input(s): PROCALCITON, LATICACIDVEN in the last 168 hours.  Recent Results (from the past 240 hour(s))  SARS CORONAVIRUS 2 (TAT 6-24 HRS) Nasopharyngeal Nasopharyngeal Swab     Status: None   Collection Time: 03/25/19  6:01 AM   Specimen: Nasopharyngeal Swab  Result Value Ref Range Status   SARS Coronavirus 2 NEGATIVE NEGATIVE Final    Comment: (NOTE) SARS-CoV-2 target nucleic acids are NOT DETECTED. The SARS-CoV-2 RNA is generally detectable in upper and lower respiratory specimens during the acute phase of infection. Negative results do not preclude SARS-CoV-2 infection, do not rule out co-infections with other pathogens, and should not be used as the sole basis for treatment or other patient management decisions. Negative results must be combined with clinical observations, patient history, and epidemiological information. The expected result is Negative. Fact Sheet for Patients: SugarRoll.be Fact Sheet for Healthcare Providers: https://www.woods-mathews.com/ This test is not yet approved or cleared by the [place] FDA and  has been authorized for detection and/or diagnosis of SARS-CoV-2 by FDA under an Emergency Use Authorization (EUA). This EUA will remain  in effect (meaning this test can be used) for the duration of the COVID-19 declaration under Section 56 4(b)(1) of the Act, 21 U.S.C. section 360bbb-3(b)(1), unless the authorization is terminated or revoked sooner. Performed at [facility], [place] [address], [place], [place] [address]   Surgical pcr screen     Status: None    Collection Time: 03/26/19  7:13 AM  Result Value Ref Range Status   MRSA, PCR NEGATIVE NEGATIVE Final   Staphylococcus aureus NEGATIVE NEGATIVE Final    Comment: (NOTE) The Xpert SA Assay (FDA approved for NASAL specimens in patients 67 years of age and older), is one component of a comprehensive surveillance program. It is not intended to diagnose infection nor to guide or monitor treatment. Performed at [facility], [place] [address], [place], [place] [address]          Radiology Studies: Pelvis Portable  Result Date: 03/26/2019 CLINICAL DATA:  Status post left hip replacement. EXAM: PORTABLE PELVIS 1-2 VIEWS COMPARISON:  None. FINDINGS: Left hip prosthesis in satisfactory position and alignment. No fracture or dislocation seen. The bones are diffusely osteopenic. IMPRESSION: Satisfactory appearance of a left hip prosthesis. Electronically Signed   By: Claudie Revering M.D.   On: 03/26/2019 13:35        Scheduled Meds: . acetaminophen  500 mg Oral Q6H  . apixaban  2.5 mg Oral BID  . B-complex with vitamin C  1 tablet Oral Daily  . Chlorhexidine Gluconate Cloth  6 each Topical Daily  . cholecalciferol  1,000 Units Oral Daily  . docusate sodium  100 mg Oral BID  . donepezil  5 mg Oral QHS  . fluticasone  2  spray Each Nare QHS  . Lifitegrast  1 drop Both Eyes QHS  . methocarbamol  750 mg Oral TID  . metoprolol succinate  25 mg Oral Daily  . mirabegron ER  50 mg Oral Daily  . potassium chloride  10 mEq Oral BID  . pravastatin  20 mg Oral Daily  . senna-docusate  2 tablet Oral BID  . sodium chloride flush  3 mL Intravenous Q12H   Continuous Infusions: . sodium chloride 50 mL/hr at 03/27/19 1333  . sodium chloride    . dextrose 5 % and 0.45% NaCl 50 mL/hr at 03/26/19 0300     LOS: 2 days    Time spent: Spent more than 30 minutes in coordinating care for this patient including bedside patient care.   Lucky Cowboy, MD [facility] If 7PM-7AM,  please contact night-coverage 03/27/2019, 5:22 PM

## 2019-03-27 NOTE — Evaluation (Signed)
Physical Therapy Evaluation Patient Details Name: Jeremy Kelley MRN: [id number] DOB: 1926/06/07 Today's Date: 03/27/2019   History of Present Illness  85 year old Caucasian male with history of mild dementia, chronic A. fib on anticoagulation, diastolic heart failure admitted with left hip fracture. s/P left hip bipolar hemiarthroplasty for femoral neck fracture.   Clinical Impression  Pt admitted with above diagnosis. Pt ambulated 6' with RW, distance limited by pain/fatigue. +2 max assist for bed mobility. ST-SNF recommended.  Pt currently with functional limitations due to the deficits listed below (see PT Problem List). Pt will benefit from skilled PT to increase their independence and safety with mobility to allow discharge to the venue listed below.       Follow Up Recommendations SNF;Supervision/Assistance - 24 hour;Supervision for mobility/OOB    Equipment Recommendations  Rolling walker with 5" wheels;3in1 (PT)    Recommendations for Other Services       Precautions / Restrictions Precautions Precautions: Fall;Posterior Hip Precaution Booklet Issued: Yes (comment) Precaution Comments: h/o 1 other fall in past 1 year; instructed pt in posterior hip precautions, placed sign on door and issued precautions handout to pt Restrictions Weight Bearing Restrictions: No LLE Weight Bearing: Weight bearing as tolerated      Mobility  Bed Mobility Overal bed mobility: Needs Assistance Bed Mobility: Supine to Sit     Supine to sit: +2 for physical assistance;Max assist     General bed mobility comments: assist to raise trunk and pivot hips to edge of bed  Transfers Overall transfer level: Needs assistance Equipment used: Rolling walker (2 wheeled) Transfers: Sit to/from Stand Sit to Stand: Mod assist;+2 physical assistance         General transfer comment: assist to rise/steady, VCs hand placement  Ambulation/Gait Ambulation/Gait assistance: Min assist;+2  safety/equipment Gait Distance (Feet): 6 Feet Assistive device: Rolling walker (2 wheeled) Gait Pattern/deviations: Step-to pattern;Trunk flexed;Decreased step length - right;Decreased step length - left Gait velocity: decr   General Gait Details: VCs for posture, distance limited by L hip pain/fatigue, VCs sequencing  Stairs            Wheelchair Mobility    Modified Rankin (Stroke Patients Only)       Balance Overall balance assessment: Needs assistance   Sitting balance-Leahy Scale: Fair     Standing balance support: Bilateral upper extremity supported Standing balance-Leahy Scale: Poor                               Pertinent Vitals/Pain Pain Assessment: Faces Faces Pain Scale: Hurts even more Pain Location: L hip with movement Pain Descriptors / Indicators: Sore;Grimacing Pain Intervention(s): Limited activity within patient's tolerance;Monitored during session;Premedicated before session;Ice applied    Home Living Family/patient expects to be discharged to:: Private residence Living Arrangements: Alone     Home Access: Level entry     Home Layout: Two level;Bed/bath upstairs Home Equipment: Cane - single point      Prior Function Level of Independence: Independent with assistive device(s)         Comments: walks with SPC, independent ADLs, drives     Hand Dominance        Extremity/Trunk Assessment   Upper Extremity Assessment Upper Extremity Assessment: Overall WFL for tasks assessed    Lower Extremity Assessment Lower Extremity Assessment: LLE deficits/detail LLE: Unable to fully assess due to pain LLE Sensation: WNL    Cervical / Trunk Assessment Cervical / Trunk Assessment: Kyphotic  Communication   Communication: HOH  Cognition Arousal/Alertness: Awake/alert Behavior During Therapy: WFL for tasks assessed/performed Overall Cognitive Status: Within Functional Limits for tasks assessed                                         General Comments      Exercises Total Joint Exercises Ankle Circles/Pumps: AROM;Both;10 reps;Supine   Assessment/Plan    PT Assessment Patient needs continued PT services  PT Problem List Decreased strength;Decreased range of motion;Decreased activity tolerance;Decreased mobility;Pain;Decreased knowledge of use of DME       PT Treatment Interventions Gait training;DME instruction;Functional mobility training;Therapeutic exercise;Therapeutic activities;Patient/family education    PT Goals (Current goals can be found in the Care Plan section)  Acute Rehab PT Goals Patient Stated Goal: be able to walk PT Goal Formulation: With patient Time For Goal Achievement: 04/10/19 Potential to Achieve Goals: Good    Frequency Min 3X/week   Barriers to discharge        Co-evaluation               AM-PAC PT "6 Clicks" Mobility  Outcome Measure Help needed turning from your back to your side while in a flat bed without using bedrails?: A Lot Help needed moving from lying on your back to sitting on the side of a flat bed without using bedrails?: A Lot Help needed moving to and from a bed to a chair (including a wheelchair)?: A Lot Help needed standing up from a chair using your arms (e.g., wheelchair or bedside chair)?: A Lot Help needed to walk in hospital room?: A Little Help needed climbing 3-5 steps with a railing? : A Lot 6 Click Score: 13    End of Session Equipment Utilized During Treatment: Gait belt Activity Tolerance: Patient limited by fatigue;Patient limited by pain Patient left: in chair;with call bell/phone within reach;with chair alarm set Nurse Communication: Mobility status PT Visit Diagnosis: Difficulty in walking, not elsewhere classified (R26.2);Pain Pain - Right/Left: Left Pain - part of body: Hip    Time: [id number] PT Time Calculation (min) (ACUTE ONLY): 23 min   Charges:   PT Evaluation $PT Eval Moderate Complexity: 1  Mod PT Treatments $Therapeutic Activity: 8-22 mins        Blondell Reveal Kistler PT 03/27/2019  Acute Rehabilitation Services Pager [phone] Office [phone]

## 2019-03-27 NOTE — Progress Notes (Signed)
Occupational Therapy Evaluation Patient Details Name: Jeremy Kelley MRN: [id number] DOB: 1926-08-01 Today's Date: 03/27/2019    History of Present Illness 85 year old Caucasian male with history of mild dementia, chronic A. fib on anticoagulation, diastolic heart failure admitted with left hip fracture. s/P left hip bipolar hemiarthroplasty for femoral neck fracture.    Clinical Impression   PTA, pt was living at home alone, and reports he was independent with ADL/IADL and functional mobility with use of single point cane. Pt reports he was still driving. Pt currently requires modA for functional mobility at RW level. During transfer to the commode, pt demonstrated limitations with navigating his environment and proper use of RW, required cueing for sequencing/navigating environment. Began education on use of adaptive equipment to assist with ADL. Due to decline in current level of function, pt would benefit from acute OT to address established goals to facilitate safe D/C to venue listed below. At this time, recommend SNF follow-up. Will continue to follow acutely.     Follow Up Recommendations  SNF;Supervision/Assistance - 24 hour    Equipment Recommendations  3 in 1 bedside commode    Recommendations for Other Services       Precautions / Restrictions Precautions Precautions: Fall;Posterior Hip Precaution Booklet Issued: Yes (comment) Precaution Comments: h/o 1 other fall in past 1 year; instructed pt in posterior hip precautions, placed sign on door and issued precautions handout to pt Required Braces or Orthoses: Knee Immobilizer - Left Restrictions Weight Bearing Restrictions: No LLE Weight Bearing: Weight bearing as tolerated      Mobility Bed Mobility Overal bed mobility: Needs Assistance Bed Mobility: Supine to Sit     Supine to sit: +2 for physical assistance;Max assist     General bed mobility comments: pt sitting in recliner upon  arrival  Transfers Overall transfer level: Needs assistance Equipment used: Rolling walker (2 wheeled) Transfers: Sit to/from Stand Sit to Stand: Mod assist         General transfer comment: assist to rise/steady, VCs hand placement    Balance Overall balance assessment: Needs assistance Sitting-balance support: Single extremity supported;Feet supported Sitting balance-Leahy Scale: Fair     Standing balance support: Bilateral upper extremity supported Standing balance-Leahy Scale: Poor Standing balance comment: heavy reliance on BUE support                           ADL either performed or assessed with clinical judgement   ADL Overall ADL's : Needs assistance/impaired Eating/Feeding: Set up;Sitting   Grooming: Minimal assistance;Sitting;Standing   Upper Body Bathing: Minimal assistance;Sitting   Lower Body Bathing: Maximal assistance;Sit to/from stand   Upper Body Dressing : Minimal assistance;Sitting   Lower Body Dressing: Maximal assistance;Sit to/from stand   Toilet Transfer: Moderate assistance;Ambulation;RW;Cueing for sequencing   Toileting- Clothing Manipulation and Hygiene: Moderate assistance;Sit to/from stand       Functional mobility during ADLs: Moderate assistance;Rolling walker;Cueing for safety;Cueing for sequencing General ADL Comments: cues for technique during mobility;pt appeared to have difficulty navigating environment, decreased awareness of surrounding obstacles, bumping into wall/door     Vision Baseline Vision/History: Wears glasses Wears Glasses: Reading only Patient Visual Report: No change from baseline Vision Assessment?: Vision impaired- to be further tested in functional context Additional Comments: pt demonstrated difficulty navigating his environment, keeps head in forward flexed posture, decreased visual awareness of objects in superior field, required vc to lift head to upright posture     Perception  Praxis       Pertinent Vitals/Pain Pain Assessment: Faces Faces Pain Scale: Hurts little more Pain Location: L hip with movement Pain Descriptors / Indicators: Sore;Grimacing Pain Intervention(s): Limited activity within patient's tolerance;Monitored during session     Hand Dominance Right   Extremity/Trunk Assessment Upper Extremity Assessment Upper Extremity Assessment: Generalized weakness   Lower Extremity Assessment Lower Extremity Assessment: Defer to PT evaluation LLE: Unable to fully assess due to pain LLE Sensation: WNL   Cervical / Trunk Assessment Cervical / Trunk Assessment: Kyphotic   Communication Communication Communication: HOH   Cognition Arousal/Alertness: Awake/alert Behavior During Therapy: WFL for tasks assessed/performed Overall Cognitive Status: History of cognitive impairments - at baseline                                 General Comments: per chart, hx of mild dementia;pt required increase time for processing multistep commands, appeared to have limitations with short term memory (unsure if due to hearing limitations)   General Comments  pt's family friend present at beginning of session;began education on AE to assist with dressing;SpO2 90-96% throughout session on RA    Exercises Total Joint Exercises Ankle Circles/Pumps: AROM;Both;10 reps;Supine   Shoulder Instructions      Home Living Family/patient expects to be discharged to:: Private residence Living Arrangements: Alone Available Help at Discharge: Family;Friend(s);Available PRN/intermittently Type of Home: House(townhome) Home Access: Level entry     Home Layout: Two level;Bed/bath upstairs Alternate Level Stairs-Number of Steps: 10   Bathroom Shower/Tub: Teacher, early years/pre: Standard Bathroom Accessibility: Yes   Home Equipment: Cane - single point          Prior Functioning/Environment Level of Independence: Independent with assistive device(s)         Comments: walks with SPC, independent ADLs, drives        OT Problem List: Decreased strength;Decreased range of motion;Decreased activity tolerance;Impaired balance (sitting and/or standing);Decreased cognition;Decreased safety awareness;Decreased knowledge of precautions;Decreased knowledge of use of DME or AE;Pain      OT Treatment/Interventions: Self-care/ADL training;Therapeutic exercise;Energy conservation;DME and/or AE instruction;Therapeutic activities;Cognitive remediation/compensation;Visual/perceptual remediation/compensation;Patient/family education;Balance training    OT Goals(Current goals can be found in the care plan section) Acute Rehab OT Goals Patient Stated Goal: to get back home safely OT Goal Formulation: With patient Time For Goal Achievement: 04/10/19 Potential to Achieve Goals: Good ADL Goals Pt Will Perform Upper Body Dressing: with set-up;sitting Pt Will Perform Lower Body Dressing: with set-up;sit to/from stand;with adaptive equipment Pt Will Transfer to Toilet: with supervision;ambulating Additional ADL Goal #1: Pt will demonstrate independence with adherence to hip precautions during ADL and functional mobility.  OT Frequency: Min 2X/week   Barriers to D/C: Decreased caregiver support  pt lives alone       Co-evaluation              AM-PAC OT "6 Clicks" Daily Activity     Outcome Measure Help from another person eating meals?: A Little Help from another person taking care of personal grooming?: A Little Help from another person toileting, which includes using toliet, bedpan, or urinal?: A Lot Help from another person bathing (including washing, rinsing, drying)?: A Lot Help from another person to put on and taking off regular upper body clothing?: A Little Help from another person to put on and taking off regular lower body clothing?: A Lot 6 Click Score: 15   End of Session Equipment Utilized During  Treatment: Gait belt;Rolling  walker Nurse Communication: Mobility status  Activity Tolerance: Patient tolerated treatment well Patient left: in chair;with call bell/phone within reach  OT Visit Diagnosis: Unsteadiness on feet (R26.81);Other abnormalities of gait and mobility (R26.89);Muscle weakness (generalized) (M62.81);History of falling (Z91.81);Other symptoms and signs involving cognitive function;Pain Pain - Right/Left: Left Pain - part of body: Hip                Time: 1108-1150 OT Time Calculation (min): 42 min Charges:  OT General Charges $OT Visit: 1 Visit OT Evaluation $OT Eval Moderate Complexity: 1 Mod OT Treatments $Self Care/Home Management : 23-37 mins  Dorinda Hill OTR/L Acute Rehabilitation Services Office: [place] 03/27/2019, 12:13 PM

## 2019-03-27 NOTE — Consult Note (Signed)
   [facility] CM Inpatient Consult   03/27/2019  Jeremy Kelley 1926-09-30 [id number]    Patient'schart reviewed for readmission less than 30 days, with 2 readmissions in the past 6 months, as well as 27% high risk score for unplanned readmissions. Patient assessed for community [place] Managementfollow up needs under his Medicare/ [employer] A[employer] plan.  Chart review and MD brief narrative on 03/26/19 reveals that: 85 year old Caucasian male with history of chronic A. fib on anticoagulation, diastolic heart failure, admitted with left hip fracture requiring surgical intervention, underwent left hip bipolar hemiarthroplasty for femoral neck fracture.  His primary care provider is [name], NP with [facility] [place], listed to provide transition of care follow-up.  Therapy evaluation completed with recommendation for short term (SNF) skilled nursing facility rehab.  Ifthere are anydisposition plan changes, please make a referral to [facility] managementfor follow-upof needsas appropriate.   Plan: [facility] post acute RN coordinator will be made aware of patient's disposition to any facility covered by [facility] post discharge follow up of needs.   For questions and referral, please contact:  Reyana Leisey A. Ahmadou Bolz, BSN, RN-BC [facility] Liaison Cell: [phone]

## 2019-03-28 LAB — BASIC METABOLIC PANEL
Anion gap: 7 (ref 5–15)
BUN: 28 mg/dL — ABNORMAL HIGH (ref 8–23)
CO2: 28 mmol/L (ref 22–32)
Calcium: 9.1 mg/dL (ref 8.9–10.3)
Chloride: 102 mmol/L (ref 98–111)
Creatinine, Ser: 0.98 mg/dL (ref 0.61–1.24)
GFR calc Af Amer: >60 mL/min (ref >60)
GFR calc non Af Amer: >60 mL/min (ref >60)
Glucose, Bld: 117 mg/dL — ABNORMAL HIGH (ref 70–99)
Potassium: 4.6 mmol/L (ref 3.5–5.1)
Sodium: 137 mmol/L (ref 135–145)

## 2019-03-28 LAB — CBC
HCT: 37.3 % — ABNORMAL LOW (ref 39.0–52.0)
Hemoglobin: 11.9 g/dL — ABNORMAL LOW (ref 13.0–17.0)
MCH: 31.7 pg (ref 26.0–34.0)
MCHC: 31.9 g/dL (ref 30.0–36.0)
MCV: 99.5 fL (ref 80.0–100.0)
Platelets: 182 10*3/uL (ref 150–400)
RBC: 3.75 MIL/uL — ABNORMAL LOW (ref 4.22–5.81)
RDW: 13.4 % (ref 11.5–15.5)
WBC: 14.4 10*3/uL — ABNORMAL HIGH (ref 4.0–10.5)
nRBC: 0 % (ref 0.0–0.2)

## 2019-03-28 LAB — MAGNESIUM: Magnesium: 2.1 mg/dL (ref 1.7–2.4)

## 2019-03-28 LAB — PHOSPHORUS: Phosphorus: 1.7 mg/dL — ABNORMAL LOW (ref 2.5–4.6)

## 2019-03-28 MED ORDER — DOCUSATE SODIUM 50 MG/5ML PO LIQD
100.0000 mg | Freq: Two times a day (BID) | ORAL | Status: DC
  Administered 2019-03-28 – 2019-03-29 (×3): 100 mg via ORAL
  Filled 2019-03-28 (×6): qty 10

## 2019-03-28 MED ORDER — K PHOS MONO-SOD PHOS DI & MONO 155-852-130 MG PO TABS
500.0000 mg | ORAL_TABLET | Freq: Two times a day (BID) | ORAL | Status: DC
  Administered 2019-03-28 – 2019-03-30 (×5): 500 mg via ORAL
  Filled 2019-03-28 (×6): qty 2

## 2019-03-28 MED ORDER — TRAMADOL HCL 50 MG PO TABS: 50.0000 mg | ORAL_TABLET | Freq: Four times a day (QID) | ORAL | 0 refills | Status: DC | PRN

## 2019-03-28 NOTE — Progress Notes (Signed)
PROGRESS NOTE    Jeremy Kelley  [id number] DOB: Apr 24, 1926 DOA: 03/25/2019 PCP: [name], NP    Brief Narrative:  85 year old Caucasian male with history of chronic A. fib on anticoagulation, diastolic heart failure admitted with left hip fracture requiring surgical intervention  Assessment & Plan:   Principal Problem:   Hip fracture ([place]) Active Problems:   Hyperlipidemia   Essential hypertension   Atrial fibrillation ([place])   Pulmonary hypertension ([place])   CAD (coronary artery disease)   S/P hip hemiarthroplasty  Left hip fracture s/p hemiarthroplasty S/p hemiarthroplasty by Dr. Lorin Mercy  Orthopedics on board-Eliquis resumed  Pain control PT/OT recommending SNF placement Case management consulted to assist with disposition  Chronic atrial fibrillation Eliquis restarted 03/27/2019.  Hemoglobin stable.  We will get H&H tomorrow morning Continue metoprolol for rate control  Mild dementia Continue Aricept  HFpEF Prior history of diastolic dysfunction elevated pulmonary artery pressures We'll hold off on torsemide today as he looks euvolemic. Continue to hold torsemide as he appears euvolemic  Leukocytosis Improving.  Likely reactive. Monitor for other signs of infection   DVT prophylaxis: [id number] Code Status: DNR  Family Communication: Discussed plan with patient's power of attorney Mr. Alexander via the telephone Disposition Plan: PT/OT recommending SNF placement, pending SNF placement   Consultants:   Orthopedics  Procedures:  Left hip hemiarthroplasty  Antimicrobials:   None   Subjective: No acute events overnight.  Patient reports doing better today.  Denies any shortness of breath, chest pain, abdominal pain.   Complains of pain in his left leg declines any pain medications other than Tylenol.  He is otherwise doing well.  Objective: Vitals:   03/27/19 1327 03/27/19 2121 03/28/19 0506 03/28/19 0626  BP: (!) 134/58 (!) 118/53 (!)  162/67 (!) 124/59  Pulse: 79 66 72 63  Resp: 16 18 18 18   Temp: 97.6 F (36.4 C) 97.7 F (36.5 C) 98.5 F (36.9 C)   TempSrc: Oral Oral Oral   SpO2: 96% 95% 91% 91%  Weight:      Height:        Intake/Output Summary (Last 24 hours) at 03/28/2019 1006 Last data filed at 03/28/2019 0600 Gross per 24 hour  Intake 1680.77 ml  Output 1550 ml  Net 130.77 ml   Filed Weights   03/25/19 0338 03/27/19 0500  Weight: 56.7 kg 54.6 kg    Examination:  General exam: Appears calm and comfortable  Respiratory system: Respiratory effort normal. Cardiovascular system: S1 & S2 heard, RRR.  Gastrointestinal system: Abdomen is nondistended, soft and nontender.  Central nervous system: Alert and oriented. No focal neurological deficits. Extremities: strength in LE not assessed  Skin: No rashes, lesions or ulcers Psychiatry: Judgement and insight appear normal. Mood & affect appropriate.   Data Reviewed: I have personally reviewed following labs and imaging studies  CBC: Recent Labs  Lab 03/25/19 0601 03/26/19 0449 03/27/19 0459 03/28/19 0500  WBC 14.2* 12.7* 15.2* 14.4*  NEUTROABS 12.4*  --   --   --   HGB 14.3 13.5 12.3* 11.9*  HCT 43.9 43.3 38.8* 37.3*  MCV 100.2* 103.6* 101.6* 99.5  PLT 237 217 186 [id number]   Basic Metabolic Panel: Recent Labs  Lab 03/25/19 0601 03/27/19 0459 03/28/19 0500  NA 141 136 137  K 4.6 4.1 4.6  CL 96* 99 102  CO2 34* 29 28  GLUCOSE 118* 112* 117*  BUN 39* 27* 28*  CREATININE 1.15 0.99 0.98  CALCIUM 9.1 8.8* 9.1  MG  --   --  2.1  PHOS  --   --  1.7*   GFR: Estimated Creatinine Clearance: 38.7 mL/min (by C-G formula based on SCr of 0.98 mg/dL). Liver Function Tests: No results for input(s): AST, ALT, ALKPHOS, BILITOT, PROT, ALBUMIN in the last 168 hours. No results for input(s): LIPASE, AMYLASE in the last 168 hours. No results for input(s): AMMONIA in the last 168 hours. Coagulation Profile: No results for input(s): INR, PROTIME in the  last 168 hours. Cardiac Enzymes: No results for input(s): CKTOTAL, CKMB, CKMBINDEX, TROPONINI in the last 168 hours. BNP (last 3 results) Recent Labs    08/31/18 1247  PROBNP 459.0*   HbA1C: No results for input(s): HGBA1C in the last 72 hours. CBG: No results for input(s): GLUCAP in the last 168 hours. Lipid Profile: No results for input(s): CHOL, HDL, LDLCALC, TRIG, CHOLHDL, LDLDIRECT in the last 72 hours. Thyroid Function Tests: No results for input(s): TSH, T4TOTAL, FREET4, T3FREE, THYROIDAB in the last 72 hours. Anemia Panel: No results for input(s): VITAMINB12, FOLATE, FERRITIN, TIBC, IRON, RETICCTPCT in the last 72 hours. Sepsis Labs: No results for input(s): PROCALCITON, LATICACIDVEN in the last 168 hours.  Recent Results (from the past 240 hour(s))  SARS CORONAVIRUS 2 (TAT 6-24 HRS) Nasopharyngeal Nasopharyngeal Swab     Status: None   Collection Time: 03/25/19  6:01 AM   Specimen: Nasopharyngeal Swab  Result Value Ref Range Status   SARS Coronavirus 2 NEGATIVE NEGATIVE Final    Comment: (NOTE) SARS-CoV-2 target nucleic acids are NOT DETECTED. The SARS-CoV-2 RNA is generally detectable in upper and lower respiratory specimens during the acute phase of infection. Negative results do not preclude SARS-CoV-2 infection, do not rule out co-infections with other pathogens, and should not be used as the sole basis for treatment or other patient management decisions. Negative results must be combined with clinical observations, patient history, and epidemiological information. The expected result is Negative. Fact Sheet for Patients: SugarRoll.be Fact Sheet for Healthcare Providers: https://www.woods-mathews.com/ This test is not yet approved or cleared by the [place] FDA and  has been authorized for detection and/or diagnosis of SARS-CoV-2 by FDA under an Emergency Use Authorization (EUA). This EUA will remain  in effect  (meaning this test can be used) for the duration of the COVID-19 declaration under Section 56 4(b)(1) of the Act, 21 U.S.C. section 360bbb-3(b)(1), unless the authorization is terminated or revoked sooner. Performed at [facility], [place] [address], [place], [place] [address]   Surgical pcr screen     Status: None   Collection Time: 03/26/19  7:13 AM  Result Value Ref Range Status   MRSA, PCR NEGATIVE NEGATIVE Final   Staphylococcus aureus NEGATIVE NEGATIVE Final    Comment: (NOTE) The Xpert SA Assay (FDA approved for NASAL specimens in patients 5 years of age and older), is one component of a comprehensive surveillance program. It is not intended to diagnose infection nor to guide or monitor treatment. Performed at [facility], [place] [address] [place], [place] [address]          Radiology Studies: Pelvis Portable  Result Date: 03/26/2019 CLINICAL DATA:  Status post left hip replacement. EXAM: PORTABLE PELVIS 1-2 VIEWS COMPARISON:  None. FINDINGS: Left hip prosthesis in satisfactory position and alignment. No fracture or dislocation seen. The bones are diffusely osteopenic. IMPRESSION: Satisfactory appearance of a left hip prosthesis. Electronically Signed   By: Claudie Revering M.D.   On: 03/26/2019 13:35        Scheduled  Meds: . apixaban  2.5 mg Oral BID  . B-complex with vitamin C  1 tablet Oral Daily  . Chlorhexidine Gluconate Cloth  6 each Topical Daily  . cholecalciferol  1,000 Units Oral Daily  . docusate  100 mg Oral BID  . donepezil  5 mg Oral QHS  . fluticasone  2 spray Each Nare QHS  . Lifitegrast  1 drop Both Eyes QHS  . methocarbamol  750 mg Oral TID  . metoprolol succinate  25 mg Oral Daily  . mirabegron ER  50 mg Oral Daily  . phosphorus  500 mg Oral BID  . pravastatin  20 mg Oral Daily  . senna-docusate  2 tablet Oral BID  . sodium chloride flush  3 mL Intravenous Q12H   Continuous Infusions: . sodium chloride 50 mL/hr at  03/28/19 0951  . sodium chloride    . dextrose 5 % and 0.45% NaCl 50 mL/hr at 03/26/19 0300     LOS: 3 days    Time spent: Spent more than 30 minutes in coordinating care for this patient including bedside patient care.   Lucky Cowboy, MD [facility] If 7PM-7AM, please contact night-coverage 03/28/2019, 10:06 AM

## 2019-03-28 NOTE — Progress Notes (Signed)
Patient requires medications to be crushed and taken with applesauce. Currently has PO ER potassium. Patient verbalized he can not take this medication. MD paged for alternative to medication.

## 2019-03-28 NOTE — TOC Initial Note (Addendum)
Transition of Care [facility]) - Initial/Assessment Note    Patient Details  Name: Jeremy Kelley MRN: [id number] Date of Birth: January 22, 1927  Transition of Care [facility]) CM/SW Contact:    Trish Mage, LCSW Phone Number: 03/28/2019, 8:25 AM  Clinical Narrative:   Jeremy Kelley was seen based on PT recommendation of SNF following L Hip surgery.  After establishing that he is open to going for rehab, Jeremy Kelley fairly quickly refers me to Jeremy Kelley, his "son" and POA.  Jeremy Kelley states Jeremy Kelley was never married, has no children, and he and their family became friends some 26 years ago.  Jeremy Kelley is the Alexander's daughter's God father. Everyone is aware of the need for rehab if Jeremy Kelley is to continue to live alone in his apartment.  I explained the referral process.  Jeremy Kelley identified no facility in particular. PASSR secured, FL2 sent out. TOC will continue to follow during the course of hospitalization. [facility]leen [facility] made bed offer immediately.  Waiting to hear from others.                 Expected Discharge Plan: Skilled Nursing Facility Barriers to Discharge: SNF Pending bed offer   Patient Goals and CMS Choice Patient states their goals for this hospitalization and ongoing recovery are:: "I guess I need to go to a nursing home for awhile." CMS Medicare.gov Compare Post Acute Care list provided to:: Patient Choice offered to / list presented to : Patient, [name] POA / Guardian  Expected Discharge Plan and Services Expected Discharge Plan: [place]   Discharge Planning Services: CM Consult Post Acute Care Choice: [place] Living arrangements for the past 2 months: Apartment                                      Prior Living Arrangements/Services Living arrangements for the past 2 months: Apartment Lives with:: Self Patient language and need for interpreter reviewed:: Yes Do you feel safe going back to the place where you  live?: Yes      Need for Family Participation in Patient Care: Yes (Comment) Care giver support system in place?: Yes (comment) Current home services: DME Criminal Activity/Legal Involvement Pertinent to Current Situation/Hospitalization: No - Comment as needed  Activities of Daily Living Home Assistive Devices/Equipment: None ADL Screening (condition at time of admission) Patient's cognitive ability adequate to safely complete daily activities?: Yes Is the patient deaf or have difficulty hearing?: Yes Does the patient have difficulty seeing, even when wearing glasses/contacts?: No Does the patient have difficulty concentrating, remembering, or making decisions?: No Patient able to express need for assistance with ADLs?: Yes Does the patient have difficulty dressing or bathing?: No Independently performs ADLs?: Yes (appropriate for developmental age) Does the patient have difficulty walking or climbing stairs?: Yes Weakness of Legs: Left Weakness of Arms/Hands: None  Permission Sought/Granted Permission sought to share information with : Family Supports Permission granted to share information with : Yes, Verbal Permission Granted  Share Information with NAME: Jeremy Kelley     Permission granted to share info w Relationship: "like a son" healthcare POA  Permission granted to share info w Contact Information: [id number]  Emotional Assessment Appearance:: Appears stated age Attitude/Demeanor/Rapport: Engaged Affect (typically observed): Appropriate Orientation: : Oriented to Self, Oriented to Place, Oriented to  Time, Oriented to Situation Alcohol / Substance Use: Not  Applicable Psych Involvement: No (comment)  Admission diagnosis:  Hip fracture ([place]) [S72.009A] Left displaced femoral neck fracture ([place]) [S72.002A] Injury of head, initial encounter [S09.90XA] Patient Active Problem List   Diagnosis Date Noted  . S/P hip hemiarthroplasty   . Hip fracture ([place]) 03/25/2019   . Small bowel obstruction ([place]) 02/21/2019  . Closed pilon fracture, left, sequela 10/11/2016  . Pain in left ankle and joints of left foot 10/04/2016  . 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 Fatigue 11/04/2015  . CAD (coronary artery disease) 08/01/2015  . Pulmonary hypertension ([place]) 07/24/2015  . Atrial fibrillation ([place]) 07/16/2015  . Aortic insufficiency 01/04/2014  . Squamous cell carcinoma in situ of skin of right lower leg 11/13/2013  . Lentigo maligna of cheek ([place]) 09/19/2013  . Actinic keratosis 11/06/2012  . Macular degeneration 08/10/2012  . Allergic rhinitis 08/10/2012  . Bladder cancer ([name]) 10/19/2011  . Eczema 02/23/2011  . Hyperglycemia 02/11/2011  . Heart murmur 09/10/2010  . ABNORMAL THYROID FUNCTION TESTS 08/05/2009  . ABNORMAL HEART RHYTHMS 07/11/2009  . Hyperlipidemia 11/28/2008  . CAROTID BRUIT 11/19/2008  . UNSPECIFIED CATARACT 10/17/2008  . PARESTHESIA 12/04/2007  . Varicose veins of bilateral lower extremities with other complications [id number]  . COLONIC POLYPS, HX OF 08/28/2007  . Essential hypertension 02/08/2007  . PROSTATE CANCER, HX OF 11/04/2006  . INGUINAL HERNIA, HX OF 11/04/2006   PCP:  [name], NP Pharmacy:   [id number] - [place], [place] [id number] [place] [id number] Phone: [phone] Fax: [phone]     Social Determinants of Health (SDOH) Interventions    Readmission Risk Interventions No flowsheet data found.

## 2019-03-28 NOTE — Anesthesia Postprocedure Evaluation (Signed)
Anesthesia Post Note  Patient: Jeremy Kelley  Procedure(s) Performed: ARTHROPLASTY BIPOLAR HIP (HEMIARTHROPLASTY) (Left Hip)     Patient location during evaluation: PACU Anesthesia Type: General Level of consciousness: awake and patient cooperative Pain management: pain level controlled Vital Signs Assessment: post-procedure vital signs reviewed and stable Respiratory status: spontaneous breathing, nonlabored ventilation, respiratory function stable and patient connected to nasal cannula oxygen Cardiovascular status: blood pressure returned to baseline and stable Postop Assessment: no apparent nausea or vomiting Anesthetic complications: no    Last Vitals:  Vitals:   03/28/19 0626 03/28/19 1346  BP: (!) 124/59 (!) 106/55  Pulse: 63 66  Resp: 18 16  Temp:  36.8 C  SpO2: 91%     Last Pain:  Vitals:   03/28/19 1346  TempSrc: Oral  PainSc:                  Leasia Swann

## 2019-03-28 NOTE — Progress Notes (Signed)
Subjective: Doing well.  Left hip pain controlled.     Objective: Vital signs in last 24 hours: Temp:  [97.7 F (36.5 C)-98.5 F (36.9 C)] 98.3 F (36.8 C) (12/16 1346) Pulse Rate:  [63-72] 66 (12/16 1346) Resp:  [16-18] 16 (12/16 1346) BP: (106-162)/(53-67) 106/55 (12/16 1346) SpO2:  [91 %-95 %] 91 % (12/16 0626)  Intake/Output from previous day: 12/15 0701 - 12/16 0700 In: 2120.7 [P.O.:960; I.V.:1160.7] Out: 1750 [Urine:1750] Intake/Output this shift: Total I/O In: 520.4 [P.O.:300; I.V.:220.4] Out: -   Recent Labs    03/26/19 0449 03/27/19 0459 03/28/19 0500  HGB 13.5 12.3* 11.9*   Recent Labs    03/27/19 0459 03/28/19 0500  WBC 15.2* 14.4*  RBC 3.82* 3.75*  HCT 38.8* 37.3*  PLT 186 182   Recent Labs    03/27/19 0459 03/28/19 0500  NA 136 137  K 4.1 4.6  CL 99 102  CO2 29 28  BUN 27* 28*  CREATININE 0.99 0.98  GLUCOSE 112* 117*  CALCIUM 8.8* 9.1   No results for input(s): LABPT, INR in the last 72 hours.  Exam: Pleasant elderly male, NAD.  Hip wound looks good.  Staples intact.  No drainage or signs of infection.      Assessment/Plan: Continue present care. Stable from ortho standpoint.  D/c planning per medicine team.       Jeremy Kelley 03/28/2019, 3:28 PM

## 2019-03-28 NOTE — Discharge Instructions (Signed)
INSTRUCTIONS AFTER LEFT HIP HEMIARTHROPLASTY    o Remove items at home which could result in a fall. This includes throw rugs or furniture in walking pathways o ICE to the affected joint every three hours while awake for 30 minutes at a time, for at least the first 3-5 days, and then as needed for pain and swelling.  Continue to use ice for pain and swelling. You may notice swelling that will progress down to the foot and ankle.  This is normal after surgery.  Elevate your leg when you are not up walking on it.   o Continue to use the breathing machine you got in the hospital (incentive spirometer) which will help keep your temperature down.  It is common for your temperature to cycle up and down following surgery, especially at night when you are not up moving around and exerting yourself.  The breathing machine keeps your lungs expanded and your temperature down.   DIET:  As you were doing prior to hospitalization, we recommend a well-balanced diet.  DRESSING / WOUND CARE / SHOWERING  You may change your dressing 3-5 days after surgery.  Then change the dressing every day with sterile gauze.  Please use good hand washing techniques before changing the dressing.  Do not use any lotions or creams on the incision until instructed by your surgeon.  RN to perform daily wound checks while in skilled nursing facility.  If any questions regarding wound healing contact Dr Lorin Mercy immediately.   *HIP STAPLES WILL POSSIBLY BE REMOVED IN OUR OFFICE AT 2 WEEK POSTOP APPOINTMENT  ACTIVITY  o Increase activity slowly as tolerated, but follow the weight bearing instructions below.   o No driving for 6 weeks or until further direction given by your physician.  You cannot drive while taking narcotics.  o No lifting or carrying greater than 10 lbs. until further directed by your surgeon. o Avoid periods of inactivity such as sitting longer than an hour when not asleep. This helps prevent blood clots.  o You may  return to work once you are authorized by your doctor.     WEIGHT BEARING   Weight bearing as tolerated with assist device (walker, cane, etc) as directed, use it as long as suggested by your surgeon or therapist, typically at least 4-6 weeks.   EXERCISES  Results after joint replacement surgery are often greatly improved when you follow the exercise, range of motion and muscle strengthening exercises prescribed by your doctor. Safety measures are also important to protect the joint from further injury. Any time any of these exercises cause you to have increased pain or swelling, decrease what you are doing until you are comfortable again and then slowly increase them. If you have problems or questions, call your caregiver or physical therapist for advice.   Rehabilitation is important following a joint replacement. After just a few days of immobilization, the muscles of the leg can become weakened and shrink (atrophy).  These exercises are designed to build up the tone and strength of the thigh and leg muscles and to improve motion. Often times heat used for twenty to thirty minutes before working out will loosen up your tissues and help with improving the range of motion but do not use heat for the first two weeks following surgery (sometimes heat can increase post-operative swelling).   These exercises can be done on a training (exercise) mat, on the floor, on a table or on a bed. Use whatever works the best  and is most comfortable for you.    Use music or television while you are exercising so that the exercises are a pleasant break in your day. This will make your life better with the exercises acting as a break in your routine that you can look forward to.   Perform all exercises about fifteen times, three times per day or as directed.  You should exercise both the operative leg and the other leg as well.  Exercises include:   . Quad Sets - Tighten up the muscle on the front of the thigh  (Quad) and hold for 5-10 seconds.   . Straight Leg Raises - With your knee straight (if you were given a brace, keep it on), lift the leg to 60 degrees, hold for 3 seconds, and slowly lower the leg.  Perform this exercise against resistance later as your leg gets stronger.  . Leg Slides: Lying on your back, slowly slide your foot toward your buttocks, bending your knee up off the floor (only go as far as is comfortable). Then slowly slide your foot back down until your leg is flat on the floor again.  Glenard Haring Wings: Lying on your back spread your legs to the side as far apart as you can without causing discomfort.  . Hamstring Strength:  Lying on your back, push your heel against the floor with your leg straight by tightening up the muscles of your buttocks.  Repeat, but this time bend your knee to a comfortable angle, and push your heel against the floor.  You may put a pillow under the heel to make it more comfortable if necessary.   A rehabilitation program following joint replacement surgery can speed recovery and prevent re-injury in the future due to weakened muscles. Contact your doctor or a physical therapist for more information on knee rehabilitation.    CONSTIPATION  Constipation is defined medically as fewer than three stools per week and severe constipation as less than one stool per week.  Even if you have a regular bowel pattern at home, your normal regimen is likely to be disrupted due to multiple reasons following surgery.  Combination of anesthesia, postoperative narcotics, change in appetite and fluid intake all can affect your bowels.   YOU MUST use at least one of the following options; they are listed in order of increasing strength to get the job done.  They are all available over the counter, and you may need to use some, POSSIBLY even all of these options:    Drink plenty of fluids (prune juice may be helpful) and high fiber foods Colace 100 mg by mouth twice a day  Senokot  for constipation as directed and as needed Dulcolax (bisacodyl), take with full glass of water  Miralax (polyethylene glycol) once or twice a day as needed.  If you have tried all these things and are unable to have a bowel movement in the first 3-4 days after surgery call either your surgeon or your primary doctor.    If you experience loose stools or diarrhea, hold the medications until you stool forms back up.  If your symptoms do not get better within 1 week or if they get worse, check with your doctor.  If you experience "the worst abdominal pain ever" or develop nausea or vomiting, please contact the office immediately for further recommendations for treatment.   ITCHING:  If you experience itching with your medications, try taking only a single pain pill, or even half a  pain pill at a time.  You can also use Benadryl over the counter for itching or also to help with sleep.   TED HOSE STOCKINGS:  Use stockings on both legs until for at least 2 weeks or as directed by physician office. They may be removed at night for sleeping.  MEDICATIONS:  See your medication summary on the "After Visit Summary" that nursing will review with you.  You may have some home medications which will be placed on hold until you complete the course of blood thinner medication.  It is important for you to complete the blood thinner medication as prescribed.  PRECAUTIONS:  If you experience chest pain or shortness of breath - call 911 immediately for transfer to the hospital emergency department.   If you develop a fever greater that 101 F, purulent drainage from wound, increased redness or drainage from wound, foul odor from the wound/dressing, or calf pain - CONTACT YOUR SURGEON.                                                   FOLLOW-UP APPOINTMENTS:  If you do not already have a post-op appointment, please call the office for an appointment to be seen by your surgeon.  Guidelines for how soon to be seen are  listed in your "After Visit Summary", but are typically between 1-4 weeks after surgery.  OTHER INSTRUCTIONS:   Knee Replacement:  Do not place pillow under knee, focus on keeping the knee straight while resting. CPM instructions: 0-90 degrees, 2 hours in the morning, 2 hours in the afternoon, and 2 hours in the evening. Place foam block, curve side up under heel at all times except when in CPM or when walking.  DO NOT modify, tear, cut, or change the foam block in any way.  MAKE SURE YOU:  . Understand these instructions.  . Get help right away if you are not doing well or get worse.    Thank you for letting us be a part of your medical care team.  It is a privilege we respect greatly.  We hope these instructions will help you stay on track for a fast and full recovery!

## 2019-03-28 NOTE — NC FL2 (Signed)
[place] LEVEL OF CARE SCREENING TOOL     IDENTIFICATION  Patient Name: Jeremy Kelley Birthdate: [birth year] Sex: male Admission Date (Current Location): 03/25/2019  [facility] and [place] Number:  Herbalist and Address:  [facility],  [place] [address] [place]      Provider Number: [id number]  Attending Physician Name and Address:  Lucky Cowboy, MD  Relative Name and Phone Number:       Current Level of Care: Hospital Recommended Level of Care: [place] Prior Approval Number:    Date Approved/Denied:   PASRR Number:   [id number]  Discharge Plan: SNF    Current Diagnoses: Patient Active Problem List   Diagnosis Date Noted  . S/P hip hemiarthroplasty   . Hip fracture ([place]) 03/25/2019  . Small bowel obstruction ([place]) 02/21/2019  . Closed pilon fracture, left, sequela 10/11/2016  . Pain in left ankle and joints of left foot 10/04/2016  . Idiopathic chronic venous hypertension of left lower extremity with ulcer and inflammation ([place]) 10/04/2016  . Fatigue 11/04/2015  . CAD (coronary artery disease) 08/01/2015  . Pulmonary hypertension ([place]) 07/24/2015  . Atrial fibrillation ([place]) 07/16/2015  . Aortic insufficiency 01/04/2014  . Squamous cell carcinoma in situ of skin of right lower leg 11/13/2013  . Lentigo maligna of cheek ([place]) 09/19/2013  . Actinic keratosis 11/06/2012  . Macular degeneration 08/10/2012  . Allergic rhinitis 08/10/2012  . Bladder cancer ([place]) 10/19/2011  . Eczema 02/23/2011  . Hyperglycemia 02/11/2011  . Heart murmur 09/10/2010  . ABNORMAL THYROID FUNCTION TESTS 08/05/2009  . ABNORMAL HEART RHYTHMS 07/11/2009  . Hyperlipidemia 11/28/2008  . CAROTID BRUIT 11/19/2008  . UNSPECIFIED CATARACT 10/17/2008  . PARESTHESIA 12/04/2007  . Varicose veins of bilateral lower extremities with other complications [id number]  . COLONIC POLYPS, HX OF 08/28/2007  . Essential hypertension  02/08/2007  . PROSTATE CANCER, HX OF 11/04/2006  . INGUINAL HERNIA, HX OF 11/04/2006    Orientation RESPIRATION BLADDER Height & Weight     Self, Time, Situation, Place  Normal External catheter Weight: 54.6 kg Height:  5\' 10"  (177.8 cm)  BEHAVIORAL SYMPTOMS/MOOD NEUROLOGICAL BOWEL NUTRITION STATUS  (none) (none) Continent Diet(regular)  AMBULATORY STATUS COMMUNICATION OF NEEDS Skin   Extensive Assist Verbally Surgical wounds(L hip)                       Personal Care Assistance Level of Assistance  Bathing, Feeding, Dressing Bathing Assistance: Maximum assistance Feeding assistance: Independent Dressing Assistance: Limited assistance     Functional Limitations Info  Sight, Hearing, Speech Sight Info: Adequate Hearing Info: Adequate Speech Info: Adequate    SPECIAL CARE FACTORS FREQUENCY  PT (By licensed PT)     PT Frequency: 5X/W              Contractures Contractures Info: Not present    Additional Factors Info  Code Status, Allergies Code Status Info: DNR Allergies Info: NKA           Current Medications (03/28/2019):  This is the current hospital active medication list Current Facility-Administered Medications  Medication Dose Route Frequency Provider Last Rate Last Admin  . 0.45 % sodium chloride infusion   Intravenous Continuous Marybelle Killings, MD 50 mL/hr at 03/27/19 1333 New Bag at 03/27/19 1333  . 0.9 %  sodium chloride infusion  250 mL Intravenous PRN Emokpae, Courage, MD      . acetaminophen (TYLENOL) tablet 325-650 mg  325-650  mg Oral Q6H PRN Marybelle Killings, MD      . albuterol (PROVENTIL) (2.5 MG/3ML) 0.083% nebulizer solution 2.5 mg  2.5 mg Nebulization Q2H PRN Emokpae, Courage, MD      . apixaban (ELIQUIS) tablet 2.5 mg  2.5 mg Oral BID Lanae Crumbly, PA-C   2.5 mg at 03/27/19 2151  . B-complex with vitamin C tablet 1 tablet  1 tablet Oral Daily Emokpae, Courage, MD   1 tablet at 03/27/19 1012  . Chlorhexidine Gluconate Cloth 2 % PADS 6  each  6 each Topical Daily Roxan Hockey, MD   6 each at 03/27/19 1013  . cholecalciferol (VITAMIN D3) tablet 1,000 Units  1,000 Units Oral Daily Roxan Hockey, MD   1,000 Units at 03/27/19 1012  . dextrose 5 %-0.45 % sodium chloride infusion   Intravenous Continuous Emokpae, Courage, MD 50 mL/hr at 03/26/19 0300 Rate Verify at 03/26/19 0300  . docusate (COLACE) 50 MG/5ML liquid 100 mg  100 mg Oral BID Hayden Pedro M, NP      . donepezil (ARICEPT) tablet 5 mg  5 mg Oral QHS Emokpae, Courage, MD   5 mg at 03/27/19 2151  . fluticasone (FLONASE) 50 MCG/ACT nasal spray 2 spray  2 spray Each Nare QHS Marybelle Killings, MD      . ipratropium (ATROVENT) 0.06 % nasal spray 2 spray  2 spray Nasal BID PRN Marybelle Killings, MD      . labetalol (NORMODYNE) injection 5 mg  5 mg Intravenous Q4H PRN Lucky Cowboy, MD      . Lifitegrast 5 % SOLN 1 drop  1 drop Both Eyes QHS Emokpae, Courage, MD      . menthol-cetylpyridinium (CEPACOL) lozenge 3 mg  1 lozenge Oral PRN Marybelle Killings, MD       Or  . phenol (CHLORASEPTIC) mouth spray 1 spray  1 spray Mouth/Throat PRN Marybelle Killings, MD      . methocarbamol (ROBAXIN) tablet 750 mg  750 mg Oral TID Roxan Hockey, MD   750 mg at 03/27/19 2151  . metoCLOPramide (REGLAN) tablet 5 mg  5 mg Oral Q8H PRN Marybelle Killings, MD       Or  . metoCLOPramide (REGLAN) injection 5 mg  5 mg Intravenous Q8H PRN Marybelle Killings, MD      . metoprolol succinate (TOPROL-XL) 24 hr tablet 25 mg  25 mg Oral Daily Emokpae, Courage, MD   25 mg at 03/27/19 1012  . mirabegron ER (MYRBETRIQ) tablet 50 mg  50 mg Oral Daily Emokpae, Courage, MD   50 mg at 03/27/19 1012  . ondansetron (ZOFRAN) tablet 4 mg  4 mg Oral Q6H PRN Marybelle Killings, MD       Or  . ondansetron [facility]) injection 4 mg  4 mg Intravenous Q6H PRN Marybelle Killings, MD      . phosphorus (K PHOS NEUTRAL) tablet 500 mg  500 mg Oral BID Lucky Cowboy, MD      . polyethylene glycol (MIRALAX / GLYCOLAX) packet 17 g  17 g Oral Daily PRN  Emokpae, Courage, MD      . pravastatin (PRAVACHOL) tablet 20 mg  20 mg Oral Daily Emokpae, Courage, MD   20 mg at 03/27/19 1012  . senna-docusate (Senokot-S) tablet 2 tablet  2 tablet Oral BID Roxan Hockey, MD   2 tablet at 03/27/19 2149  . sodium chloride flush (NS) 0.9 % injection 3 mL  3 mL  Intravenous Q12H Roxan Hockey, MD   3 mL at 03/27/19 2152  . sodium chloride flush (NS) 0.9 % injection 3 mL  3 mL Intravenous PRN Emokpae, Courage, MD      . traZODone (DESYREL) tablet 50 mg  50 mg Oral QHS PRN Roxan Hockey, MD         Discharge Medications: Please see discharge summary for a list of discharge medications.  Relevant Imaging Results:  Relevant Lab Results:   Additional Information [address], [place]

## 2019-03-28 NOTE — Progress Notes (Signed)
Physical Therapy Treatment Patient Details Name: Jeremy Kelley MRN: [id number] DOB: 05-May-1926 Today's Date: 03/28/2019    History of Present Illness 85 year old Caucasian male with history of mild dementia, chronic A. fib on anticoagulation, diastolic heart failure admitted with left hip fracture. s/P left hip bipolar hemiarthroplasty for femoral neck fracture.     PT Comments    Pt reporting needing to place his dentures in his mouth and then wipe off face and then wanted to drink his cola prior to mobilizing.  Pt needing set up assist for these tasks.  Pt assisted with ambulating in hallway which was limited by UE fatigue. Continue to recommend SNF upon d/c.   Follow Up Recommendations  SNF;Supervision/Assistance - 24 hour     Equipment Recommendations  Rolling walker with 5" wheels;3in1 (PT)    Recommendations for Other Services       Precautions / Restrictions Precautions Precautions: Fall;Posterior Hip Precaution Comments: reviewed posterior hip precautions Required Braces or Orthoses: Knee Immobilizer - Left Restrictions Weight Bearing Restrictions: No LLE Weight Bearing: Weight bearing as tolerated    Mobility  Bed Mobility               General bed mobility comments: pt sitting in recliner upon arrival  Transfers Overall transfer level: Needs assistance Equipment used: Rolling walker (2 wheeled) Transfers: Sit to/from Stand Sit to Stand: Min assist         General transfer comment: assist to rise/steady, VCs hand placement  Ambulation/Gait Ambulation/Gait assistance: Min assist Gait Distance (Feet): 20 Feet Assistive device: Rolling walker (2 wheeled) Gait Pattern/deviations: Step-to pattern;Trunk flexed;Decreased stance time - left Gait velocity: decr   General Gait Details: distance limited by bil UE fatigue, VCs sequencing, posture and maintaining hip precautions   Stairs             Wheelchair Mobility    Modified Rankin  (Stroke Patients Only)       Balance                                            Cognition Arousal/Alertness: Awake/alert Behavior During Therapy: WFL for tasks assessed/performed Overall Cognitive Status: History of cognitive impairments - at baseline                                 General Comments: per chart, hx of mild dementia;pt required increase time for processing multistep commands, limitations with short term memory      Exercises      General Comments        Pertinent Vitals/Pain Pain Assessment: Faces Faces Pain Scale: Hurts little more Pain Location: L hip with movement Pain Descriptors / Indicators: Sore;Grimacing Pain Intervention(s): Repositioned;Monitored during session    Home Living                      Prior Function            PT Goals (current goals can now be found in the care plan section) Progress towards PT goals: Progressing toward goals    Frequency    Min 3X/week      PT Plan Current plan remains appropriate    Co-evaluation              AM-PAC PT "6 Clicks" Mobility   Outcome  Measure  Help needed turning from your back to your side while in a flat bed without using bedrails?: A Lot Help needed moving from lying on your back to sitting on the side of a flat bed without using bedrails?: A Lot Help needed moving to and from a bed to a chair (including a wheelchair)?: A Lot Help needed standing up from a chair using your arms (e.g., wheelchair or bedside chair)?: A Little Help needed to walk in hospital room?: A Little Help needed climbing 3-5 steps with a railing? : A Lot 6 Click Score: 14    End of Session Equipment Utilized During Treatment: Gait belt Activity Tolerance: Patient limited by fatigue;Patient limited by pain Patient left: in chair;with call bell/phone within reach(no box for chair alarm bed, pt already in chair on arrival) Nurse Communication: Mobility status PT  Visit Diagnosis: Difficulty in walking, not elsewhere classified (R26.2);Pain Pain - Right/Left: Left Pain - part of body: Hip     Time: 1022-1050 PT Time Calculation (min) (ACUTE ONLY): 28 min  Charges:  $Gait Training: 8-22 mins $Therapeutic Activity: 8-22 mins                     Arlyce Dice, DPT Acute Rehabilitation Services Office: [phone]  York Ram E 03/28/2019, 12:44 PM

## 2019-03-29 LAB — SARS CORONAVIRUS 2 (TAT 6-24 HRS): SARS Coronavirus 2: NEGATIVE

## 2019-03-29 LAB — HEMOGLOBIN AND HEMATOCRIT, BLOOD
HCT: 35.1 % — ABNORMAL LOW (ref 39.0–52.0)
Hemoglobin: 11.4 g/dL — ABNORMAL LOW (ref 13.0–17.0)

## 2019-03-29 NOTE — Progress Notes (Signed)
PROGRESS NOTE    Jeremy Kelley  [id number] DOB: 1927/01/15 DOA: 03/25/2019 PCP: [name], NP    Brief Narrative:  85 year old Caucasian male with history of chronic A. fib on anticoagulation, diastolic heart failure admitted with left hip fracture requiring surgical intervention  Assessment & Plan:   Principal Problem:   Hip fracture ([place]) Active Problems:   Hyperlipidemia   Essential hypertension   Atrial fibrillation ([place])   Pulmonary hypertension ([place])   CAD (coronary artery disease)   S/P hip hemiarthroplasty  Left hip fracture s/p hemiarthroplasty S/p hemiarthroplasty by Dr. Lorin Mercy  Orthopedics on board-Eliquis resumed  Pain control PT/OT recommending SNF placement Case management consulted to assist with disposition  Chronic atrial fibrillation Eliquis restarted 03/27/2019.  Hemoglobin stable.   Continue metoprolol for rate control  Mild dementia Continue Aricept  HFpEF Prior history of diastolic dysfunction elevated pulmonary artery pressures Holding off on torsemide  Leukocytosis Improving.  Likely reactive. Monitor for other signs of infection   DVT prophylaxis: [id number] Code Status: DNR  Family Communication: Discussed plan with patient and POA Jeremy Kelley Coil at bedside  Disposition Plan: Once screening covid test results, plan for discharge to SNF likely tomorrow    Consultants:   Orthopedics  Procedures:  Left hip hemiarthroplasty  Antimicrobials:   None   Subjective: No acute events overnight.  Patient reports doing better today.  Denies any shortness of breath, chest pain, abdominal pain.   He is ready to leave the hospital. Agreeable to wait until covid results.  He is otherwise doing well.  Objective: Vitals:   03/28/19 1346 03/28/19 2340 03/29/19 0646 03/29/19 1326  BP: (!) 106/55 117/63 (!) 127/55 130/62  Pulse: 66 71 61 65  Resp: 16 16 16 15   Temp: 98.3 F (36.8 C) 97.7 F (36.5 C) 98.2 F (36.8 C) 98.4 F  (36.9 C)  TempSrc: Oral Oral Oral Oral  SpO2:  96% 96% 99%  Weight:      Height:        Intake/Output Summary (Last 24 hours) at 03/29/2019 1615 Last data filed at 03/29/2019 1400 Gross per 24 hour  Intake 1867.15 ml  Output 275 ml  Net 1592.15 ml   Filed Weights   03/25/19 0338 03/27/19 0500  Weight: 56.7 kg 54.6 kg    Examination:  General exam: Appears calm and comfortable  Respiratory system: Respiratory effort normal. Cardiovascular system: S1 & S2 heard, RRR.  Gastrointestinal system: Abdomen is nondistended, soft and nontender.  Central nervous system: Alert and oriented. No focal neurological deficits. Extremities: strength in LE not assessed  Skin: No rashes, lesions or ulcers Psychiatry: Judgement and insight appear normal. Mood & affect appropriate.   Data Reviewed: I have personally reviewed following labs and imaging studies  CBC: Recent Labs  Lab 03/25/19 0601 03/26/19 0449 03/27/19 0459 03/28/19 0500 03/29/19 0440  WBC 14.2* 12.7* 15.2* 14.4*  --   NEUTROABS 12.4*  --   --   --   --   HGB 14.3 13.5 12.3* 11.9* 11.4*  HCT 43.9 43.3 38.8* 37.3* 35.1*  MCV 100.2* 103.6* 101.6* 99.5  --   PLT 237 217 186 182  --    Basic Metabolic Panel: Recent Labs  Lab 03/25/19 0601 03/27/19 0459 03/28/19 0500  NA 141 136 137  K 4.6 4.1 4.6  CL 96* 99 102  CO2 34* 29 28  GLUCOSE 118* 112* 117*  BUN 39* 27* 28*  CREATININE 1.15 0.99 0.98  CALCIUM 9.1 8.8* 9.1  MG  --   --  2.1  PHOS  --   --  1.7*   GFR: Estimated Creatinine Clearance: 38.7 mL/min (by C-G formula based on SCr of 0.98 mg/dL). Liver Function Tests: No results for input(s): AST, ALT, ALKPHOS, BILITOT, PROT, ALBUMIN in the last 168 hours. No results for input(s): LIPASE, AMYLASE in the last 168 hours. No results for input(s): AMMONIA in the last 168 hours. Coagulation Profile: No results for input(s): INR, PROTIME in the last 168 hours. Cardiac Enzymes: No results for input(s):  CKTOTAL, CKMB, CKMBINDEX, TROPONINI in the last 168 hours. BNP (last 3 results) Recent Labs    08/31/18 1247  PROBNP 459.0*   HbA1C: No results for input(s): HGBA1C in the last 72 hours. CBG: No results for input(s): GLUCAP in the last 168 hours. Lipid Profile: No results for input(s): CHOL, HDL, LDLCALC, TRIG, CHOLHDL, LDLDIRECT in the last 72 hours. Thyroid Function Tests: No results for input(s): TSH, T4TOTAL, FREET4, T3FREE, THYROIDAB in the last 72 hours. Anemia Panel: No results for input(s): VITAMINB12, FOLATE, FERRITIN, TIBC, IRON, RETICCTPCT in the last 72 hours. Sepsis Labs: No results for input(s): PROCALCITON, LATICACIDVEN in the last 168 hours.  Recent Results (from the past 240 hour(s))  SARS CORONAVIRUS 2 (TAT 6-24 HRS) Nasopharyngeal Nasopharyngeal Swab     Status: None   Collection Time: 03/25/19  6:01 AM   Specimen: Nasopharyngeal Swab  Result Value Ref Range Status   SARS Coronavirus 2 NEGATIVE NEGATIVE Final    Comment: (NOTE) SARS-CoV-2 target nucleic acids are NOT DETECTED. The SARS-CoV-2 RNA is generally detectable in upper and lower respiratory specimens during the acute phase of infection. Negative results do not preclude SARS-CoV-2 infection, do not rule out co-infections with other pathogens, and should not be used as the sole basis for treatment or other patient management decisions. Negative results must be combined with clinical observations, patient history, and epidemiological information. The expected result is Negative. Fact Sheet for Patients: SugarRoll.be Fact Sheet for Healthcare Providers: https://www.woods-mathews.com/ This test is not yet approved or cleared by the [place] FDA and  has been authorized for detection and/or diagnosis of SARS-CoV-2 by FDA under an Emergency Use Authorization (EUA). This EUA will remain  in effect (meaning this test can be used) for the duration of  the COVID-19 declaration under Section 56 4(b)(1) of the Act, 21 U.S.C. section 360bbb-3(b)(1), unless the authorization is terminated or revoked sooner. Performed at [facility], [place] [address] [place], [place] [address]   Surgical pcr screen     Status: None   Collection Time: 03/26/19  7:13 AM  Result Value Ref Range Status   MRSA, PCR NEGATIVE NEGATIVE Final   Staphylococcus aureus NEGATIVE NEGATIVE Final    Comment: (NOTE) The Xpert SA Assay (FDA approved for NASAL specimens in patients 52 years of age and older), is one component of a comprehensive surveillance program. It is not intended to diagnose infection nor to guide or monitor treatment. Performed at [facility], [place] [address], [place], [place] [address]          Radiology Studies: No results found.      Scheduled Meds: . apixaban  2.5 mg Oral BID  . B-complex with vitamin C  1 tablet Oral Daily  . Chlorhexidine Gluconate Cloth  6 each Topical Daily  . cholecalciferol  1,000 Units Oral Daily  . docusate  100 mg Oral BID  . donepezil  5 mg Oral QHS  . fluticasone  2 spray Each Nare QHS  .  Lifitegrast  1 drop Both Eyes QHS  . methocarbamol  750 mg Oral TID  . metoprolol succinate  25 mg Oral Daily  . mirabegron ER  50 mg Oral Daily  . phosphorus  500 mg Oral BID  . pravastatin  20 mg Oral Daily  . senna-docusate  2 tablet Oral BID  . sodium chloride flush  3 mL Intravenous Q12H   Continuous Infusions: . sodium chloride    . dextrose 5 % and 0.45% NaCl Stopped (03/29/19 1108)     LOS: 4 days    Time spent: Spent more than 30 minutes in coordinating care for this patient including bedside patient care.   Lucky Cowboy, MD [facility] If 7PM-7AM, please contact night-coverage 03/29/2019, 4:15 PM

## 2019-03-29 NOTE — Care Management Important Message (Signed)
Important Message  Patient Details IM Letter given to [place] Case Manager to present to the Patient Name: Jeremy Kelley MRN: [id number] Date of Birth: 1926/08/20   Medicare Important Message Given:  Yes     Kerin Salen 03/29/2019, 1:26 PM

## 2019-03-29 NOTE — Plan of Care (Signed)
Patient ambulated to bathroom with FWW and 1 assist; tolerated well. Back to sit up in chair with feel elevated. Pain controlled. No needs expressed. Will continue to monitor.

## 2019-03-30 ENCOUNTER — Ambulatory Visit: Payer: Medicare Other | Admitting: Cardiovascular Disease

## 2019-03-30 MED ORDER — TORSEMIDE 20 MG PO TABS: 20.0000 mg | ORAL_TABLET | Freq: Every day | ORAL | 1 refills | Status: DC

## 2019-03-30 MED ORDER — ACETAMINOPHEN 325 MG PO TABS: 325.0000 mg | ORAL_TABLET | Freq: Four times a day (QID) | ORAL | 1 refills | Status: AC | PRN

## 2019-03-30 MED ORDER — K PHOS MONO-SOD PHOS DI & MONO 155-852-130 MG PO TABS: 500.0000 mg | ORAL_TABLET | Freq: Two times a day (BID) | ORAL | 0 refills | Status: DC

## 2019-03-30 NOTE — Discharge Summary (Signed)
Physician Discharge Summary  Jeremy Kelley [id number] DOB: Aug 23, 1926 DOA: 03/25/2019  PCP: [name], NP  Admit date: 03/25/2019 Discharge date: 03/30/2019  Admitted From: Home  Disposition:  SNF  Recommendations for Outpatient Follow-up:  1. Follow up with PCP, orthopedic in 1-2 weeks 2. Please obtain BMP/CBC in one week to assess Hb and potassium  3. Torsemide dose was changed to 20 mg from 40 mg daily. Please reassess volume status and adjust dose accordingly.  4. He was on potassium supplements which were discontinued as he did not require replacement while in the hospital likely since he was not receiving torsemide.  5. Reassess risk and benefit of anticoagulation given increased risk of fall.   Home Health: No Equipment/Devices:none   Discharge Condition: Stable CODE STATUS: DNR Diet recommendation: regular diet   Brief/Interim Summary: 85 y/o CM w/ PMH of Afib on anticoagulation, HTN, CAD, HLD admitted with left hip fracture after a mechanical fall. He underwent hemiarthroplasty by Dr. Lorin Mercy. Post-operative course was uncomplicated and eliquis was restarted with no signs of bleeding. Torsemide was held as he appeared euvolemic and started at half home dose prior to discharge. Note to have leukocytosis which was thought to be reactive given no other signs of an infection. He was evaluated by PT/OT who recommended SNF for rehab which was arranged by case management. Screening covid test negative.   Discharge Diagnoses:  Principal Problem:   Hip fracture ([place]) Active Problems:   Hyperlipidemia   Essential hypertension   Atrial fibrillation (HCC)   Pulmonary hypertension (HCC)   CAD (coronary artery disease)   S/P hip hemiarthroplasty   Discharge Instructions:  Discharge Instructions    Call MD for:  difficulty breathing, headache or visual disturbances   Complete by: As directed    Call MD for:  persistant dizziness or light-headedness   Complete by: As  directed    Call MD for:  redness, tenderness, or signs of infection (pain, swelling, redness, odor or green/yellow discharge around incision site)   Complete by: As directed    Diet - low sodium heart healthy   Complete by: As directed    Increase activity slowly   Complete by: As directed      Allergies as of 03/30/2019   No Known Allergies     Medication List    STOP taking these medications   KLOR-CON 10 PO   Klor-Con M10 10 MEQ tablet Generic drug: potassium chloride   predniSONE 10 MG tablet Commonly known as: DELTASONE   traMADol 50 MG tablet Commonly known as: ULTRAM     TAKE these medications   acetaminophen 325 MG tablet Commonly known as: TYLENOL Take 1-2 tablets (325-650 mg total) by mouth every 6 (six) hours as needed for mild pain or moderate pain (pain score 1-3 or temp > 100.5).   B-complex with vitamin C tablet Take 1 tablet by mouth daily. Reported on 07/24/2015   cholecalciferol 1000 units tablet Commonly known as: VITAMIN D Take 1 tablet (1,000 Units total) by mouth daily.   docusate sodium 100 MG capsule Commonly known as: COLACE Take 1 capsule (100 mg total) by mouth 2 (two) times daily.   donepezil 5 MG tablet Commonly known as: ARICEPT Take 1/2 tablet daily for 2 weeks, then increase to 1 tablet daily What changed:   how much to take  how to take this  when to take this  additional instructions   Eliquis 2.5 MG Tabs tablet Generic drug: apixaban TAKE 1 TABLET  BY MOUTH TWICE A DAY What changed: how much to take   fluorometholone 0.1 % ophthalmic suspension Commonly known as: FML Place 1 drop into both eyes 4 (four) times daily.   fluticasone 50 MCG/ACT nasal spray Commonly known as: FLONASE Place 2 sprays into both nostrils at bedtime.   ipratropium 0.06 % nasal spray Commonly known as: ATROVENT Place 2 sprays into the nose 2 (two) times daily as needed. Nasal drainage   metoprolol succinate 25 MG 24 hr tablet Commonly  known as: TOPROL-XL TAKE 1 TABLET BY MOUTH EVERYDAY AT BEDTIME What changed: See the new instructions.   mirabegron ER 50 MG Tb24 tablet Commonly known as: Myrbetriq Take 1 tablet (50 mg total) by mouth daily.   phosphorus 155-852-130 MG tablet Commonly known as: K PHOS NEUTRAL Take 2 tablets (500 mg total) by mouth 2 (two) times daily.   polyethylene glycol 17 g packet Commonly known as: MIRALAX / GLYCOLAX Take 17 g by mouth daily as needed for mild constipation.   pravastatin 20 MG tablet Commonly known as: PRAVACHOL TAKE 1 TABLET BY MOUTH EVERYDAY AT BEDTIME What changed: See the new instructions.   torsemide 20 MG tablet Commonly known as: DEMADEX Take 1 tablet (20 mg total) by mouth daily. What changed: when to take this   Xiidra 5 % Soln Generic drug: Lifitegrast Place 1 drop into both eyes 2 (two) times a day.      Follow-up Information    Marybelle Killings, MD. Schedule an appointment as soon as possible for a visit today.   Specialty: Orthopedic Surgery Why: needs return office visit with Dr Lorin Mercy 2 weeks postop Contact information: [place] [address] [phone]          No Known Allergies  Consultations:  Orthopedic   Procedures/Studies: DG Lumbar Spine Complete  Result Date: 03/25/2019 CLINICAL DATA:  Fall, hip pain EXAM: LUMBAR SPINE - COMPLETE 4+ VIEW COMPARISON:  None. FINDINGS: Normal alignment of the vertebral bodies. There is severe joint space narrowing from L2-S1. No loss vertebral body height. Endplate osteophytosis. IMPRESSION: No acute findings lumbar spine. Multilevel disc osteophytic disease and joint space narrowing. Electronically Signed   By: Suzy Bouchard M.D.   On: 03/25/2019 05:01   CT Head Wo Contrast  Result Date: 03/25/2019 CLINICAL DATA:  Minor trauma EXAM: CT HEAD WITHOUT CONTRAST TECHNIQUE: Contiguous axial images were obtained from the base of the skull through the vertex without intravenous  contrast. COMPARISON:  None. FINDINGS: Brain: No acute intracranial hemorrhage. No focal mass lesion. No CT evidence of acute infarction. No midline shift or mass effect. No hydrocephalus. Basilar cisterns are patent. There are periventricular and subcortical white matter hypodensities. Generalized cortical atrophy. Vascular: No hyperdense vessel or unexpected calcification. Skull: Normal. Negative for fracture or focal lesion. Sinuses/Orbits: Paranasal sinuses and mastoid air cells are clear. Orbits are clear. A polypoid mucosal lesion the LEFT ethmoid sinus not changed from comparison MRI. Consistent with mucous retention cyst or polyp. Other: None. IMPRESSION: 1. No acute intracranial findings. 2. Atrophy and white matter microvascular disease. Electronically Signed   By: Suzy Bouchard M.D.   On: 03/25/2019 04:54   Pelvis Portable  Result Date: 03/26/2019 CLINICAL DATA:  Status post left hip replacement. EXAM: PORTABLE PELVIS 1-2 VIEWS COMPARISON:  None. FINDINGS: Left hip prosthesis in satisfactory position and alignment. No fracture or dislocation seen. The bones are diffusely osteopenic. IMPRESSION: Satisfactory appearance of a left hip prosthesis. Electronically Signed   By: Remo Lipps  Joneen Caraway M.D.   On: 03/26/2019 13:35   DG Chest Port 1 View  Result Date: 03/25/2019 CLINICAL DATA:  Preoperative chest radiograph EXAM: PORTABLE CHEST 1 VIEW COMPARISON:  None. FINDINGS: Moderate cardiomegaly. Lungs are mildly hyperinflated. Pulmonary arteries are prominent. No sizable pleural effusion. No focal airspace consolidation or pulmonary edema. IMPRESSION: 1. No active cardiopulmonary disease. 2. Moderate cardiomegaly. Electronically Signed   By: Ulyses Jarred M.D.   On: 03/25/2019 05:40   DG Knee Complete 4 Views Left  Result Date: 03/25/2019 CLINICAL DATA:  Left hip pain.  Fall. EXAM: LEFT KNEE - COMPLETE 4+ VIEW COMPARISON:  None. FINDINGS: No evidence of fracture, dislocation, or joint effusion. No  evidence of arthropathy or other focal bone abnormality. Soft tissues are unremarkable. IMPRESSION: Negative. Electronically Signed   By: Ulyses Jarred M.D.   On: 03/25/2019 05:09   DG Hip Unilat W or Wo Pelvis 2-3 Views Left  Result Date: 03/25/2019 CLINICAL DATA:  Fall, hip pain EXAM: DG HIP (WITH OR WITHOUT PELVIS) 2-3V LEFT COMPARISON:  None. FINDINGS: Subcapital fracture of the LEFT femoral neck with varus angulation. No dislocation. There is some proximal migration of the distal fracture segment. IMPRESSION: Subcapital fracture of the LEFT femoral neck. Electronically Signed   By: Suzy Bouchard M.D.   On: 03/25/2019 04:59   VAS [place] LOWER EXTREMITY VENOUS (DVT)  Result Date: 03/16/2019  Lower Venous Study Indications: Pain, Swelling, and right calf. Patient had a skin cancer removed off right mid section medial calf about 2 weeks ago. There is still somewhat of an open wound there which was not covered when he came in. Since the procedure he has had increased swelling and redness. He denies any chest pain or SOB.  Anticoagulation: Eliquis. Comparison Study: Bilateral venous reflux exam 08/23/2016 that was negative for                   DVT, positive for reflux in both femoral veins and left                   popiteal vein. Performing Technologist: Salvadore Dom RVT, RDCS (AE), RDMS  Examination Guidelines: A complete evaluation includes B-mode imaging, spectral Doppler, color Doppler, and power Doppler as needed of all accessible portions of each vessel. Bilateral testing is considered an integral part of a complete examination. Limited examinations for reoccurring indications may be performed as noted.  +---------+---------------+---------+-----------+----------+--------------+ RIGHT    CompressibilityPhasicitySpontaneityPropertiesThrombus Aging +---------+---------------+---------+-----------+----------+--------------+ CFV      Full           Yes      Yes                                  +---------+---------------+---------+-----------+----------+--------------+ SFJ      Full           Yes      Yes                                 +---------+---------------+---------+-----------+----------+--------------+ FV Prox  Full           Yes      Yes                                 +---------+---------------+---------+-----------+----------+--------------+ FV Mid   Full  Yes      Yes                                 +---------+---------------+---------+-----------+----------+--------------+ FV DistalFull           Yes      Yes                                 +---------+---------------+---------+-----------+----------+--------------+ PFV      Full                                                        +---------+---------------+---------+-----------+----------+--------------+ POP      Full           Yes      Yes                                 +---------+---------------+---------+-----------+----------+--------------+ PTV      Full           Yes      Yes                                 +---------+---------------+---------+-----------+----------+--------------+ PERO     Full           Yes      Yes                                 +---------+---------------+---------+-----------+----------+--------------+ Gastroc  Full                                                        +---------+---------------+---------+-----------+----------+--------------+ GSV      Full           Yes      Yes                                 +---------+---------------+---------+-----------+----------+--------------+   +----+---------------+---------+-----------+----------+--------------+ LEFTCompressibilityPhasicitySpontaneityPropertiesThrombus Aging +----+---------------+---------+-----------+----------+--------------+ CFV Full           Yes      Yes                                  +----+---------------+---------+-----------+----------+--------------+  Findings reported to ory Nafziger,NP email through EPIC at 4:35 pm.  Summary: Right: There is no evidence of deep vein thrombosis in the lower extremity. No cystic structure found in the popliteal fossa. Superficial edema seen in medial and posterior calf.  Left: No evidence of common femoral vein obstruction.  *See table(s) above for measurements and observations. Electronically signed by Ida Rogue MD on 03/16/2019 at 4:46:46 PM.    Final       Subjective:   Discharge Exam: Vitals:   03/30/19 0636 03/30/19 0838  BP: (!) 126/57 127/65  Pulse: [name])  56 64  Resp: 18 16  Temp: 97.7 F (36.5 C) 98.2 F (36.8 C)  SpO2: 96% 97%   Vitals:   03/29/19 1326 03/29/19 2048 03/30/19 0636 03/30/19 0838  BP: 130/62 130/64 (!) 126/57 127/65  Pulse: 65 (!) 59 (!) 56 64  Resp: 15 16 18 16   Temp: 98.4 F (36.9 C) 98.9 F (37.2 C) 97.7 F (36.5 C) 98.2 F (36.8 C)  TempSrc: Oral Oral Oral Oral  SpO2: 99% 99% 96% 97%  Weight:   54.6 kg   Height:        General: Pt is alert, awake, not in acute distress, hard of hearing  Cardiovascular: RRR, S1/S2 +, no murmurs Respiratory: CTA bilaterally, no wheezing, no rhonchi Abdominal: Soft, NT, ND, bowel sounds + Extremities: no edema, no cyanosis. Left extremity in boot.  Skin: bruising bilateral UE    The results of significant diagnostics from this hospitalization (including imaging, microbiology, ancillary and laboratory) are listed below for reference.     Microbiology: Recent Results (from the past 240 hour(s))  SARS CORONAVIRUS 2 (TAT 6-24 HRS) Nasopharyngeal Nasopharyngeal Swab     Status: None   Collection Time: 03/25/19  6:01 AM   Specimen: Nasopharyngeal Swab  Result Value Ref Range Status   SARS Coronavirus 2 NEGATIVE NEGATIVE Final    Comment: (NOTE) SARS-CoV-2 target nucleic acids are NOT DETECTED. The SARS-CoV-2 RNA is generally detectable in upper and  lower respiratory specimens during the acute phase of infection. Negative results do not preclude SARS-CoV-2 infection, do not rule out co-infections with other pathogens, and should not be used as the sole basis for treatment or other patient management decisions. Negative results must be combined with clinical observations, patient history, and epidemiological information. The expected result is Negative. Fact Sheet for Patients: SugarRoll.be Fact Sheet for Healthcare Providers: https://www.woods-mathews.com/ This test is not yet approved or cleared by the [place] FDA and  has been authorized for detection and/or diagnosis of SARS-CoV-2 by FDA under an Emergency Use Authorization (EUA). This EUA will remain  in effect (meaning this test can be used) for the duration of the COVID-19 declaration under Section 56 4(b)(1) of the Act, 21 U.S.C. section 360bbb-3(b)(1), unless the authorization is terminated or revoked sooner. Performed at [facility], [place] [address], [place], [place] [address]   Surgical pcr screen     Status: None   Collection Time: 03/26/19  7:13 AM  Result Value Ref Range Status   MRSA, PCR NEGATIVE NEGATIVE Final   Staphylococcus aureus NEGATIVE NEGATIVE Final    Comment: (NOTE) The Xpert SA Assay (FDA approved for NASAL specimens in patients 75 years of age and older), is one component of a comprehensive surveillance program. It is not intended to diagnose infection nor to guide or monitor treatment. Performed at [facility], [place] [address], [place], [place] [address]   SARS CORONAVIRUS 2 (TAT 6-24 HRS) Nasopharyngeal Nasopharyngeal Swab     Status: None   Collection Time: 03/29/19 12:07 PM   Specimen: Nasopharyngeal Swab  Result Value Ref Range Status   SARS Coronavirus 2 NEGATIVE NEGATIVE Final    Comment: (NOTE) SARS-CoV-2 target nucleic acids are NOT DETECTED. The SARS-CoV-2 RNA is  generally detectable in upper and lower respiratory specimens during the acute phase of infection. Negative results do not preclude SARS-CoV-2 infection, do not rule out co-infections with other pathogens, and should not be used as the sole basis for treatment or other patient management decisions. Negative  results must be combined with clinical observations, patient history, and epidemiological information. The expected result is Negative. Fact Sheet for Patients: SugarRoll.be Fact Sheet for Healthcare Providers: https://www.woods-mathews.com/ This test is not yet approved or cleared by the [place] FDA and  has been authorized for detection and/or diagnosis of SARS-CoV-2 by FDA under an Emergency Use Authorization (EUA). This EUA will remain  in effect (meaning this test can be used) for the duration of the COVID-19 declaration under Section 56 4(b)(1) of the Act, 21 U.S.C. section 360bbb-3(b)(1), unless the authorization is terminated or revoked sooner. Performed at [facility], [place] [address], [place], [place] [address]      Labs: BNP (last 3 results) No results for input(s): BNP in the last 8760 hours. Basic Metabolic Panel: Recent Labs  Lab 03/25/19 0601 03/27/19 0459 03/28/19 0500  NA 141 136 137  K 4.6 4.1 4.6  CL 96* 99 102  CO2 34* 29 28  GLUCOSE 118* 112* 117*  BUN 39* 27* 28*  CREATININE 1.15 0.99 0.98  CALCIUM 9.1 8.8* 9.1  MG  --   --  2.1  PHOS  --   --  1.7*   Liver Function Tests: No results for input(s): AST, ALT, ALKPHOS, BILITOT, PROT, ALBUMIN in the last 168 hours. No results for input(s): LIPASE, AMYLASE in the last 168 hours. No results for input(s): AMMONIA in the last 168 hours. CBC: Recent Labs  Lab 03/25/19 0601 03/26/19 0449 03/27/19 0459 03/28/19 0500 03/29/19 0440  WBC 14.2* 12.7* 15.2* 14.4*  --   NEUTROABS 12.4*  --   --   --   --   HGB 14.3 13.5 12.3* 11.9* 11.4*  HCT 43.9  43.3 38.8* 37.3* 35.1*  MCV 100.2* 103.6* 101.6* 99.5  --   PLT 237 217 186 182  --    Cardiac Enzymes: No results for input(s): CKTOTAL, CKMB, CKMBINDEX, TROPONINI in the last 168 hours. BNP: Invalid input(s): POCBNP CBG: No results for input(s): GLUCAP in the last 168 hours. D-Dimer No results for input(s): DDIMER in the last 72 hours. Hgb A1c No results for input(s): HGBA1C in the last 72 hours. Lipid Profile No results for input(s): CHOL, HDL, LDLCALC, TRIG, CHOLHDL, LDLDIRECT in the last 72 hours. Thyroid function studies No results for input(s): TSH, T4TOTAL, T3FREE, THYROIDAB in the last 72 hours.  Invalid input(s): FREET3 Anemia work up No results for input(s): VITAMINB12, FOLATE, FERRITIN, TIBC, IRON, RETICCTPCT in the last 72 hours. Urinalysis    Component Value Date/Time   COLORURINE YELLOW 02/21/2019 1100   APPEARANCEUR CLEAR 02/21/2019 1100   LABSPEC 1.015 02/21/2019 1100   PHURINE 7.0 02/21/2019 1100   GLUCOSEU NEGATIVE 02/21/2019 1100   HGBUR NEGATIVE 02/21/2019 1100   BILIRUBINUR NEGATIVE 02/21/2019 1100   BILIRUBINUR n 07/15/2015 1320   KETONESUR NEGATIVE 02/21/2019 1100   PROTEINUR NEGATIVE 02/21/2019 1100   UROBILINOGEN 0.2 07/15/2015 1320   NITRITE NEGATIVE 02/21/2019 1100   LEUKOCYTESUR NEGATIVE 02/21/2019 1100   Sepsis Labs Invalid input(s): PROCALCITONIN,  WBC,  LACTICIDVEN Microbiology Recent Results (from the past 240 hour(s))  SARS CORONAVIRUS 2 (TAT 6-24 HRS) Nasopharyngeal Nasopharyngeal Swab     Status: None   Collection Time: 03/25/19  6:01 AM   Specimen: Nasopharyngeal Swab  Result Value Ref Range Status   SARS Coronavirus 2 NEGATIVE NEGATIVE Final    Comment: (NOTE) SARS-CoV-2 target nucleic acids are NOT DETECTED. The SARS-CoV-2 RNA is generally detectable in upper and lower respiratory specimens during the acute  phase of infection. Negative results do not preclude SARS-CoV-2 infection, do not rule out co-infections with other  pathogens, and should not be used as the sole basis for treatment or other patient management decisions. Negative results must be combined with clinical observations, patient history, and epidemiological information. The expected result is Negative. Fact Sheet for Patients: SugarRoll.be Fact Sheet for Healthcare Providers: https://www.woods-mathews.com/ This test is not yet approved or cleared by the [place] FDA and  has been authorized for detection and/or diagnosis of SARS-CoV-2 by FDA under an Emergency Use Authorization (EUA). This EUA will remain  in effect (meaning this test can be used) for the duration of the COVID-19 declaration under Section 56 4(b)(1) of the Act, 21 U.S.C. section 360bbb-3(b)(1), unless the authorization is terminated or revoked sooner. Performed at [facility], [place] [address] [place], [place] [address]   Surgical pcr screen     Status: None   Collection Time: 03/26/19  7:13 AM  Result Value Ref Range Status   MRSA, PCR NEGATIVE NEGATIVE Final   Staphylococcus aureus NEGATIVE NEGATIVE Final    Comment: (NOTE) The Xpert SA Assay (FDA approved for NASAL specimens in patients 71 years of age and older), is one component of a comprehensive surveillance program. It is not intended to diagnose infection nor to guide or monitor treatment. Performed at [facility], [place] [address] [place], [place] [address]   SARS CORONAVIRUS 2 (TAT 6-24 HRS) Nasopharyngeal Nasopharyngeal Swab     Status: None   Collection Time: 03/29/19 12:07 PM   Specimen: Nasopharyngeal Swab  Result Value Ref Range Status   SARS Coronavirus 2 NEGATIVE NEGATIVE Final    Comment: (NOTE) SARS-CoV-2 target nucleic acids are NOT DETECTED. The SARS-CoV-2 RNA is generally detectable in upper and lower respiratory specimens during the acute phase of infection. Negative results do not preclude SARS-CoV-2 infection, do not  rule out co-infections with other pathogens, and should not be used as the sole basis for treatment or other patient management decisions. Negative results must be combined with clinical observations, patient history, and epidemiological information. The expected result is Negative. Fact Sheet for Patients: SugarRoll.be Fact Sheet for Healthcare Providers: https://www.woods-mathews.com/ This test is not yet approved or cleared by the [place] FDA and  has been authorized for detection and/or diagnosis of SARS-CoV-2 by FDA under an Emergency Use Authorization (EUA). This EUA will remain  in effect (meaning this test can be used) for the duration of the COVID-19 declaration under Section 56 4(b)(1) of the Act, 21 U.S.C. section 360bbb-3(b)(1), unless the authorization is terminated or revoked sooner. Performed at [facility], [place] [address], [place], [place] [address]      Time coordinating discharge: Over 30 minutes  SIGNED:   Lucky Cowboy, MD  [facility] 03/30/2019, 10:37 AM  If 7PM-7AM, please contact night-coverage

## 2019-03-30 NOTE — Progress Notes (Signed)
[facility] here to transport pt at this time.  Pt stable, discharge paperwork placed in packet for facility.

## 2019-03-30 NOTE — Progress Notes (Signed)
Report called to RN at [facility].  IV removed, pt dressed, belongings gathered and placed in bag. Pt awaiting to be transferred via [facility].

## 2019-03-30 NOTE — Plan of Care (Signed)

## 2019-03-30 NOTE — TOC Transition Note (Addendum)
Transition of Care [facility]) - CM/SW Discharge Note   Patient Details  Name: Jeremy Kelley MRN: [id number] Date of Birth: [birth year]  [facility] of [facility] CM/SW Contact:  Wende Neighbors, LCSW Phone Number: 03/30/2019, 11:10 AM   Clinical Narrative:   Patient to discharge to [name] via [place]. Rn to call [phone] (rm# [phone]) for report     Final next level of care: Skilled Nursing Facility Barriers to Discharge: No Barriers Identified   Patient Goals and CMS Choice Patient states their goals for this hospitalization and ongoing recovery are:: "I guess I need to go to a nursing home for awhile." CMS Medicare.gov Compare Post Acute Care list provided to:: Patient Choice offered to / list presented to : Patient, [name] POA / [place]  Discharge Placement              Patient chooses bed at: [name] Patient to be transferred to facility by: [facility] Name of family member notified: POA wyhnn Patient and family notified of of transfer: 03/30/19  Discharge Plan and Services   Discharge Planning Services: CM Consult Post Acute Care Choice: [name]                               Social Determinants of Health (SDOH) Interventions     Readmission Risk Interventions No flowsheet data found.

## 2019-04-02 ENCOUNTER — Non-Acute Institutional Stay (SKILLED_NURSING_FACILITY): Payer: Medicare Other | Admitting: Internal Medicine

## 2019-04-02 ENCOUNTER — Telehealth: Payer: Self-pay

## 2019-04-02 ENCOUNTER — Encounter: Payer: Self-pay | Admitting: Internal Medicine

## 2019-04-02 DIAGNOSIS — G301 Alzheimer's disease with late onset: Secondary | ICD-10-CM

## 2019-04-02 DIAGNOSIS — I482 Chronic atrial fibrillation, unspecified: Secondary | ICD-10-CM | POA: Diagnosis not present

## 2019-04-02 DIAGNOSIS — F0280 Dementia in other diseases classified elsewhere without behavioral disturbance: Secondary | ICD-10-CM

## 2019-04-02 DIAGNOSIS — I10 Essential (primary) hypertension: Secondary | ICD-10-CM

## 2019-04-02 DIAGNOSIS — E785 Hyperlipidemia, unspecified: Secondary | ICD-10-CM

## 2019-04-02 DIAGNOSIS — S72002A Fracture of unspecified part of neck of left femur, initial encounter for closed fracture: Secondary | ICD-10-CM | POA: Diagnosis not present

## 2019-04-02 LAB — COMPREHENSIVE METABOLIC PANEL
Calcium: 8.8 (ref 8.7–10.7)
GFR calc Af Amer: 88.78
GFR calc non Af Amer: 76.6

## 2019-04-02 LAB — BASIC METABOLIC PANEL
BUN: 18 (ref 4–21)
CO2: 28 — AB (ref 13–22)
Chloride: 107 (ref 99–108)
Creatinine: 0.9 (ref 0.6–1.3)
Glucose: 98
Potassium: 3.7 (ref 3.4–5.3)
Sodium: 145 (ref 137–147)

## 2019-04-02 LAB — CBC AND DIFFERENTIAL
HCT: 33 — AB (ref 41–53)
Hemoglobin: 11.4 — AB (ref 13.5–17.5)
Neutrophils Absolute: 8
Platelets: 225 (ref 150–399)
WBC: 9.5

## 2019-04-02 LAB — CBC: RBC: 3.49 — AB (ref 3.87–5.11)

## 2019-04-02 NOTE — Telephone Encounter (Signed)
Jeremy Kelley, wound care nurse with [facility].would like to know if patient's dressing needs to stay in place?  Patient had surgery on Monday, 03/26/2019.  Cb# is [phone].  Please advise.  Thank you.

## 2019-04-02 NOTE — Progress Notes (Signed)
: Provider:  Hennie Duos., MD Location:  [place] [facility] [place] Room Number: 509-P Place of Service:  SNF (31)  PCP: [name], NP Patient Care Team: [name], NP as PCP - General (Family Medicine) Nahser, [name], MD as PCP - Cardiology (Cardiology) Bjorn Loser, MD as Consulting Physician (Urology) [facility] as Physical Therapist (Physical Therapy)  Extended Emergency Contact Information Primary Emergency Contact: wynn,Ardelia Wrede Mobile Phone: [phone] Relation: Other Secondary Emergency Contact: Tomie China, [place] [address] [place] Mobile Phone: [phone] Relation: Other     Allergies: Patient has no known allergies.  Chief Complaint  Patient presents with  . New Admit To SNF    New admission to [facility] SNF     HPI: Patient is a 85 y.o. male with atrial fibrillation on anticoagulation, hypertension, CAD, hyperlipidemia, who was admitted to [facility] from 12/13-18 with left hip fracture after mechanical fall.  He underwent hemiarthroplasty by Dr. Lorin Mercy.  Patient is admitted to skilled nursing facility for OT/PT.  While at skilled nursing facility patient will be followed for atrial fibrillation treated with Eliquis and metoprolol, hypertension treated with metoprolol and Demadex and hyperlipidemia treated with Pravachol.  Past Medical History:  Diagnosis Date  . Arthritis   . Carotid artery stenosis 11/2008   bilateral 40-59% stenosis  . Colon polyps   . Diverticulosis   . Hepatitis    doesn't know which type 1969  . Hyperlipidemia   . Hypertension   . Numbness    fingertips  . Phimosis   . Prostate cancer ([place]) 2001  . Urothelial carcinoma of left distal ureter [facility])     Past Surgical History:  Procedure Laterality Date  . BIOPSY  10/01/2011   Procedure: BIOPSY;  Surgeon: [name], MD;  [facility];  Service: Urology;;  biopsy of bladder  tumor  . BLADDER SUSPENSION  08/18/10   [name]  . CARDIAC CATHETERIZATION N/A 07/29/2015   Procedure: Right/Left Heart Cath and Coronary Angiography;  Surgeon: Peter M Martinique, MD;  Location: [place] CV LAB;  Service: Cardiovascular;  Laterality: N/A;  . CATARACT EXTRACTION W/ INTRAOCULAR LENS  IMPLANT, BILATERAL Bilateral   . CYSTOSCOPY W/ RETROGRADES  01/27/2012   Procedure: CYSTOSCOPY WITH RETROGRADE PYELOGRAM;  Surgeon: [name], MD;  [facility];  Service: Urology;  Laterality: Left;  . CYSTOSCOPY WITH RETROGRADE PYELOGRAM, URETEROSCOPY AND STENT PLACEMENT Left 11/24/2012   Procedure: CYSTOSCOPY WITH left RETROGRADE PYELOGRAM, bladder washings, ureteroscopy;  Surgeon: [name], MD;  [facility];  Service: Urology;  Laterality: Left;  . CYSTOSCOPY WITH RETROGRADE PYELOGRAM, URETEROSCOPY AND STENT PLACEMENT Bilateral 11/19/2013   Procedure: CYSTOSCOPY WITH BILATERAL RETROGRADE PYELOGRAM,;  Surgeon: [name], MD;  [facility];  Service: Urology;  Laterality: Bilateral;  . EYE SURGERY  09/24/10   left eye. "Retina surgery"  . HIP ARTHROPLASTY Left 03/26/2019   Procedure: ARTHROPLASTY BIPOLAR HIP (HEMIARTHROPLASTY);  Surgeon: Marybelle Killings, MD;  [facility];  Service: Orthopedics;  Laterality: Left;  Depuy press fit, lateral position  . INGUINAL HERNIA REPAIR  1960  . PROSTATE SURGERY     s/p laparoscopic prostate surgery  . SKIN BIOPSY  05/20/2017   Superficial and nodular basal cell carcinoma (left temple)    basal cell carcinoma with sclerosis (right temple)  . 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  SKIN CANCER EXCISION     a/p skin cancer right foot-non melanoma  . skin laceration    . TEE WITHOUT CARDIOVERSION N/A 07/29/2015   Procedure: TRANSESOPHAGEAL ECHOCARDIOGRAM (TEE);  Surgeon: Pixie Casino, MD;  Location: [facility] ENDOSCOPY;  Service: Cardiovascular;  Laterality: N/A;  cath to follow  .  TRANSURETHRAL RESECTION OF BLADDER TUMOR  10/01/2011   Procedure: TRANSURETHRAL RESECTION OF BLADDER TUMOR (TURBT);  Surgeon: [name], MD;  [facility];  Service: Urology;  Laterality: N/A;  . TRANSURETHRAL RESECTION OF BLADDER TUMOR N/A 11/19/2013   Procedure: TRANSURETHRAL RESECTION OF BLADDER TUMOR (TURBT);  Surgeon: [name], MD;  [facility];  Service: Urology;  Laterality: N/A;  . URETEROSCOPY  01/27/2012   Procedure: URETEROSCOPY;  Surgeon: [name], MD;  [facility];  Service: Urology;  Laterality: Left;  CYSTO, Left RETROGRADE PYELOGRPHY, LEFT URETEROSCOPY   . URINARY SPHINCTER IMPLANT    . VARICOSE VEIN SURGERY      Allergies as of 04/02/2019   No Known Allergies     Medication List       Accurate as of April 02, 2019 11:59 PM. If you have any questions, ask your nurse or doctor.        acetaminophen 325 MG tablet Commonly known as: TYLENOL Take 1-2 tablets (325-650 mg total) by mouth every 6 (six) hours as needed for mild pain or moderate pain (pain score 1-3 or temp > 100.5).   B-complex with vitamin C tablet Take 1 tablet by mouth daily. Reported on 07/24/2015   cholecalciferol 1000 units tablet Commonly known as: VITAMIN D Take 1 tablet (1,000 Units total) by mouth daily.   docusate sodium 100 MG capsule Commonly known as: COLACE Take 1 capsule (100 mg total) by mouth 2 (two) times daily.   donepezil 5 MG tablet Commonly known as: ARICEPT Take 1/2 tablet daily for 2 weeks, then increase to 1 tablet daily What changed:   how much to take  how to take this  when to take this  additional instructions   Eliquis 2.5 MG Tabs tablet Generic drug: apixaban TAKE 1 TABLET BY MOUTH TWICE A DAY What changed: how much to take   fluorometholone 0.1 % ophthalmic suspension Commonly known as: FML Place 1 drop into both eyes 4 (four) times daily.   fluticasone 50 MCG/ACT nasal spray Commonly known as: FLONASE Place 2 sprays into both  nostrils at bedtime.   ipratropium 0.06 % nasal spray Commonly known as: ATROVENT Place 2 sprays into the nose 2 (two) times daily as needed. Nasal drainage   metoprolol succinate 25 MG 24 hr tablet Commonly known as: TOPROL-XL TAKE 1 TABLET BY MOUTH EVERYDAY AT BEDTIME What changed: See the new instructions.   mirabegron ER 50 MG Tb24 tablet Commonly known as: Myrbetriq Take 1 tablet (50 mg total) by mouth daily.   phosphorus 155-852-130 MG tablet Commonly known as: K PHOS NEUTRAL Take 2 tablets (500 mg total) by mouth 2 (two) times daily.   polyethylene glycol 17 g packet Commonly known as: MIRALAX / GLYCOLAX Take 17 g by mouth daily as needed for mild constipation.   pravastatin 20 MG tablet Commonly known as: PRAVACHOL TAKE 1 TABLET BY MOUTH EVERYDAY AT BEDTIME What changed: See the new instructions.   torsemide 20 MG tablet Commonly known as: DEMADEX Take 1 tablet (20 mg total) by mouth daily.   Xiidra 5 % Soln Generic drug: Lifitegrast Place 1 drop into both eyes 2 (two) times a  day.       No orders of the defined types were placed in this encounter.   Immunization History  Administered Date(s) Administered  . Influenza Split 02/08/2011  . Influenza Whole 02/09/2007  . Influenza, High Dose Seasonal PF 02/02/2017  . Influenza,inj,Quad PF,6+ Mos 03/21/2014, 12/06/2014  . Influenza-Unspecified 01/11/2019  . Pneumococcal Conjugate-13 12/06/2014  . Pneumococcal Polysaccharide-23 02/09/2007  . Td 04/12/1995  . Tdap 02/01/2012, 12/16/2015    Social History   Tobacco Use  . Smoking status: Never Smoker  . Smokeless tobacco: Never Used  . Tobacco comment: 07/29/2015 "might smoke a cigar once/year"  Substance Use Topics  . Alcohol use: Yes    Alcohol/week: 14.0 standard drinks    Types: 14 Glasses of wine per week    Comment: 07/29/2015 "big glass of wine q night"    Family history is positive for dad who had a stroke.  Family History  Problem Relation  Age of Onset  . Colon cancer Neg Hx   . Colon polyps Neg Hx       Review of Systems  GENERAL:  no fevers, fatigue, appetite changes SKIN: No itching, or rash EYES: No eye pain, redness, discharge EARS: No earache, tinnitus, change in hearing NOSE: No congestion, drainage or bleeding  MOUTH/THROAT: No mouth or tooth pain, No sore throat RESPIRATORY: No cough, wheezing, SOB CARDIAC: No chest pain, palpitations, lower extremity edema  GI: No abdominal pain, No N/V/D or constipation, No heartburn or reflux  GU: No dysuria, frequency or urgency, or incontinence  MUSCULOSKELETAL: No unrelieved bone/joint pain NEUROLOGIC: No headache, dizziness or focal weakness PSYCHIATRIC: No c/o anxiety or sadness   Vitals:   04/02/19 1229  BP: (!) 115/51  Pulse: 73  Resp: 18  Temp: 97.6 F (36.4 C)    SpO2 Readings from Last 1 Encounters:  03/30/19 97%   Body mass index is 17.27 kg/m.     Physical Exam  GENERAL APPEARANCE: Alert, conversant,  No acute distress.  SKIN: No diaphoresis rash HEAD: Normocephalic, atraumatic  EYES: Conjunctiva/lids clear. Pupils round, reactive. EOMs intact.  EARS: External exam WNL, canals clear. Hearing grossly normal.  NOSE: No deformity or discharge.  MOUTH/THROAT: Lips w/o lesions  RESPIRATORY: Breathing is even, unlabored. Lung sounds are clear   CARDIOVASCULAR: Heart irregular, 4/6 systolic murmur, no rubs or gallops. No peripheral edema.   GASTROINTESTINAL: Abdomen is soft, non-tender, not distended w/ normal bowel sounds. GENITOURINARY: Bladder non tender, not distended  MUSCULOSKELETAL: No abnormal joints or musculature NEUROLOGIC:  Cranial nerves 2-12 grossly intact. Moves all extremities  PSYCHIATRIC: Mood and affect appropriate to situation with dementia, no behavioral issues  Patient Active Problem List   Diagnosis Date Noted  . S/P hip hemiarthroplasty   . Hip fracture ([place]) 03/25/2019  . Small bowel obstruction ([place]) 02/21/2019  .  Closed pilon fracture, left, sequela 10/11/2016  . Pain in left ankle and joints of left foot 10/04/2016  . Idiopathic chronic venous hypertension of left lower extremity with ulcer and inflammation ([place]) 10/04/2016  . Fatigue 11/04/2015  . CAD (coronary artery disease) 08/01/2015  . Pulmonary hypertension ([place]) 07/24/2015  . Atrial fibrillation ([place]) 07/16/2015  . Aortic insufficiency 01/04/2014  . Squamous cell carcinoma in situ of skin of right lower leg 11/13/2013  . Lentigo maligna of cheek ([place]) 09/19/2013  . Actinic keratosis 11/06/2012  . Macular degeneration 08/10/2012  . Allergic rhinitis 08/10/2012  . Bladder cancer ([place]) 10/19/2011  . Eczema 02/23/2011  . Hyperglycemia 02/11/2011  .  Heart murmur 09/10/2010  . ABNORMAL THYROID FUNCTION TESTS 08/05/2009  . ABNORMAL HEART RHYTHMS 07/11/2009  . Hyperlipidemia 11/28/2008  . CAROTID BRUIT 11/19/2008  . UNSPECIFIED CATARACT 10/17/2008  . PARESTHESIA 12/04/2007  . Varicose veins of bilateral lower extremities with other complications [id number]  . COLONIC POLYPS, HX OF 08/28/2007  . Essential hypertension 02/08/2007  . PROSTATE CANCER, HX OF 11/04/2006  . INGUINAL HERNIA, HX OF 11/04/2006      Labs reviewed: Basic Metabolic Panel:    Component Value Date/Time   NA 145 04/02/2019 0000   K 3.7 04/02/2019 0000   CL 107 04/02/2019 0000   CO2 28 (A) 04/02/2019 0000   GLUCOSE 117 (H) 03/28/2019 0500   BUN 18 04/02/2019 0000   CREATININE 0.9 04/02/2019 0000   CREATININE 0.98 03/28/2019 0500   CREATININE 1.32 (H) 12/12/2015 1228   CALCIUM 8.8 04/02/2019 0000   PROT 7.0 02/21/2019 1100   ALBUMIN 4.0 02/21/2019 1100   AST 31 02/21/2019 1100   ALT 17 02/21/2019 1100   ALKPHOS 79 02/21/2019 1100   BILITOT 1.6 (H) 02/21/2019 1100   GFRNONAA 76.60 04/02/2019 0000   GFRAA 88.78 04/02/2019 0000    Recent Labs    03/25/19 0601 03/27/19 0459 03/28/19 0500 04/02/19 0000  NA 141 136 137 145  K 4.6 4.1 4.6 3.7  CL 96*  99 102 107  CO2 34* 29 28 28*  GLUCOSE 118* 112* 117*  --   BUN 39* 27* 28* 18  CREATININE 1.15 0.99 0.98 0.9  CALCIUM 9.1 8.8* 9.1 8.8  MG  --   --  2.1  --   PHOS  --   --  1.7*  --    Liver Function Tests: Recent Labs    08/31/18 1247 02/21/19 1100  AST 23 31  ALT 14 17  ALKPHOS 53 79  BILITOT 0.9 1.6*  PROT 6.3 7.0  ALBUMIN 3.9 4.0   Recent Labs    02/21/19 1100  LIPASE 26   No results for input(s): AMMONIA in the last 8760 hours. CBC: Recent Labs    02/27/19 1358 03/25/19 0601 03/26/19 0449 03/27/19 0459 03/28/19 0500 03/29/19 0440 04/02/19 0000  WBC 7.0 14.2* 12.7* 15.2* 14.4*  --  9.5  NEUTROABS 5.2 12.4*  --   --   --   --  8  HGB 12.8* 14.3 13.5 12.3* 11.9* 11.4* 11.4*  HCT 37.5* 43.9 43.3 38.8* 37.3* 35.1* 33*  MCV 95.7 100.2* 103.6* 101.6* 99.5  --   --   PLT 196.0 237 217 186 182  --  225   Lipid No results for input(s): CHOL, HDL, LDLCALC, TRIG in the last 8760 hours.  Cardiac Enzymes: No results for input(s): CKTOTAL, CKMB, CKMBINDEX, TROPONINI in the last 8760 hours. BNP: No results for input(s): BNP in the last 8760 hours. No results found for: [facility] Lab Results  Component Value Date   HGBA1C 5.9 (H) 02/22/2011   Lab Results  Component Value Date   TSH 0.737 01/22/2019   Lab Results  Component Value Date   [id number] 08/31/2018   Lab Results  Component Value Date   FOLATE > 20.0 ng/mL 12/04/2007   Lab Results  Component Value Date   IRON 63 08/31/2018   TIBC 320 08/31/2018   FERRITIN 50 04/07/2018    Imaging and Procedures obtained prior to SNF admission: DG Lumbar Spine Complete  Result Date: 03/25/2019 CLINICAL DATA:  Fall, hip pain EXAM: LUMBAR SPINE - COMPLETE 4+ VIEW COMPARISON:  None. FINDINGS: Normal alignment of the vertebral bodies. There is severe joint space narrowing from L2-S1. No loss vertebral body height. Endplate osteophytosis. IMPRESSION: No acute findings lumbar spine. Multilevel disc  osteophytic disease and joint space narrowing. Electronically Signed   By: Suzy Bouchard M.D.   On: 03/25/2019 05:01   CT Head Wo Contrast  Result Date: 03/25/2019 CLINICAL DATA:  Minor trauma EXAM: CT HEAD WITHOUT CONTRAST TECHNIQUE: Contiguous axial images were obtained from the base of the skull through the vertex without intravenous contrast. COMPARISON:  None. FINDINGS: Brain: No acute intracranial hemorrhage. No focal mass lesion. No CT evidence of acute infarction. No midline shift or mass effect. No hydrocephalus. Basilar cisterns are patent. There are periventricular and subcortical white matter hypodensities. Generalized cortical atrophy. Vascular: No hyperdense vessel or unexpected calcification. Skull: Normal. Negative for fracture or focal lesion. Sinuses/Orbits: Paranasal sinuses and mastoid air cells are clear. Orbits are clear. A polypoid mucosal lesion the LEFT ethmoid sinus not changed from comparison MRI. Consistent with mucous retention cyst or polyp. Other: None. IMPRESSION: 1. No acute intracranial findings. 2. Atrophy and white matter microvascular disease. Electronically Signed   By: Suzy Bouchard M.D.   On: 03/25/2019 04:54   Pelvis Portable  Result Date: 03/26/2019 CLINICAL DATA:  Status post left hip replacement. EXAM: PORTABLE PELVIS 1-2 VIEWS COMPARISON:  None. FINDINGS: Left hip prosthesis in satisfactory position and alignment. No fracture or dislocation seen. The bones are diffusely osteopenic. IMPRESSION: Satisfactory appearance of a left hip prosthesis. Electronically Signed   By: Claudie Revering M.D.   On: 03/26/2019 13:35   DG Chest Port 1 View  Result Date: 03/25/2019 CLINICAL DATA:  Preoperative chest radiograph EXAM: PORTABLE CHEST 1 VIEW COMPARISON:  None. FINDINGS: Moderate cardiomegaly. Lungs are mildly hyperinflated. Pulmonary arteries are prominent. No sizable pleural effusion. No focal airspace consolidation or pulmonary edema. IMPRESSION: 1. No active  cardiopulmonary disease. 2. Moderate cardiomegaly. Electronically Signed   By: Ulyses Jarred M.D.   On: 03/25/2019 05:40   DG Knee Complete 4 Views Left  Result Date: 03/25/2019 CLINICAL DATA:  Left hip pain.  Fall. EXAM: LEFT KNEE - COMPLETE 4+ VIEW COMPARISON:  None. FINDINGS: No evidence of fracture, dislocation, or joint effusion. No evidence of arthropathy or other focal bone abnormality. Soft tissues are unremarkable. IMPRESSION: Negative. Electronically Signed   By: Ulyses Jarred M.D.   On: 03/25/2019 05:09   DG Hip Unilat W or Wo Pelvis 2-3 Views Left  Result Date: 03/25/2019 CLINICAL DATA:  Fall, hip pain EXAM: DG HIP (WITH OR WITHOUT PELVIS) 2-3V LEFT COMPARISON:  None. FINDINGS: Subcapital fracture of the LEFT femoral neck with varus angulation. No dislocation. There is some proximal migration of the distal fracture segment. IMPRESSION: Subcapital fracture of the LEFT femoral neck. Electronically Signed   By: Suzy Bouchard M.D.   On: 03/25/2019 04:59     Not all labs, radiology exams or other studies done during hospitalization come through on my EPIC note; however they are reviewed by me.    Assessment and Plan  Left hip fracture/left hip arthroplasty-from a mechanical fall; uncomplicated.  Eliquis was restarted with no sign of bleeding And will be used as prophylaxis since patient is already on it for atrial fibrillation SNF-admitted for OT/PT  Atrial fibrillation SNF-rate control with metoprolol 25 XL daily and prophylaxed with Eliquis 2.5 mg twice daily  Hypertension SNF-controlled; continue Toprol-XL 25 mg daily and Demadex 20 mg daily  Hyperlipidemia SNF-not stated as uncontrolled; continue Pravachol  20 mg daily  Dementia SNF daily Aricept 2.5 mg daily for 2 weeks then 5 mg daily   Time spent grain 35 minutes;> 50% of time with patient was spent reviewing records, labs, tests and studies, counseling and developing plan of care  Hennie Duos, MD

## 2019-04-03 ENCOUNTER — Encounter: Payer: Self-pay | Admitting: Internal Medicine

## 2019-04-03 ENCOUNTER — Ambulatory Visit: Payer: Medicare Other | Admitting: Neurology

## 2019-04-03 DIAGNOSIS — J069 Acute upper respiratory infection, unspecified: Secondary | ICD-10-CM | POA: Diagnosis not present

## 2019-04-03 NOTE — Telephone Encounter (Signed)
I called Jeremy Kelley and advised, leave dressing until first post op appt in office unless complications.

## 2019-04-08 ENCOUNTER — Encounter: Payer: Self-pay | Admitting: Internal Medicine

## 2019-04-08 DIAGNOSIS — F0390 Unspecified dementia without behavioral disturbance: Secondary | ICD-10-CM

## 2019-04-08 HISTORY — DX: Unspecified dementia, unspecified severity, without behavioral disturbance, psychotic disturbance, mood disturbance, and anxiety: F03.90

## 2019-04-09 LAB — CBC AND DIFFERENTIAL
HCT: 31 — AB (ref 41–53)
Hemoglobin: 10.8 — AB (ref 13.5–17.5)
Neutrophils Absolute: 5
Platelets: 300 (ref 150–399)
WBC: 6.4

## 2019-04-09 LAB — BASIC METABOLIC PANEL
BUN: 16 (ref 4–21)
CO2: 32 — AB (ref 13–22)
Chloride: 105 (ref 99–108)
Creatinine: 0.9 (ref 0.6–1.3)
Glucose: 97
Potassium: 3.5 (ref 3.4–5.3)
Sodium: 145 (ref 137–147)

## 2019-04-09 LAB — CBC: RBC: 3.29 — AB (ref 3.87–5.11)

## 2019-04-09 LAB — COMPREHENSIVE METABOLIC PANEL
Calcium: 8.9 (ref 8.7–10.7)
GFR calc Af Amer: 88.77
GFR calc non Af Amer: 76.59

## 2019-04-10 ENCOUNTER — Ambulatory Visit (INDEPENDENT_AMBULATORY_CARE_PROVIDER_SITE_OTHER): Payer: Medicare Other | Admitting: Orthopaedic Surgery

## 2019-04-10 ENCOUNTER — Other Ambulatory Visit: Payer: Self-pay

## 2019-04-10 ENCOUNTER — Ambulatory Visit (INDEPENDENT_AMBULATORY_CARE_PROVIDER_SITE_OTHER): Payer: Medicare Other

## 2019-04-10 ENCOUNTER — Encounter: Payer: Self-pay | Admitting: Orthopaedic Surgery

## 2019-04-10 VITALS — Ht 70.0 in | Wt 120.0 lb

## 2019-04-10 DIAGNOSIS — Z96649 Presence of unspecified artificial hip joint: Secondary | ICD-10-CM

## 2019-04-10 NOTE — Progress Notes (Signed)
Post-Op Visit Note   Patient: Jeremy Kelley           Date of Birth: Aug 11, 1926           MRN: [id number] Visit Date: 04/10/2019 PCP: [name], NP   Assessment & Plan: Continue weightbearing as tolerated.  Gradual increase walking distance and increase strength and stamina.  Chief Complaint:  Chief Complaint  Patient presents with  . Left Hip - Routine Post Op    03/26/2019 Left Hip Hemiarthroplasty   Visit Diagnoses:  1. S/P hip hemiarthroplasty     Plan: Post left hip hemiarthroplasty Staples removed x-rays look good.  Continue weightbearing as tolerated.  Follow-Up Instructions: Return in 2 months (on 06/10/2019), or if symptoms worsen or fail to improve.   Orders:  Orders Placed This Encounter  Procedures  . XR HIP UNILAT W OR W/O PELVIS 2-3 VIEWS LEFT   No orders of the defined types were placed in this encounter.   Imaging: XR HIP UNILAT W OR W/O PELVIS 2-3 VIEWS LEFT  Result Date: 04/10/2019 AP frog-leg x-rays left hip demonstrates bipolar hemiarthroplasty in satisfactory position alignment leg lengths are equal by radiographs. Impression: Satisfactory left hip hemiarthroplasty.   PMFS History: Patient Active Problem List   Diagnosis Date Noted  . Dementia ([place]) 04/08/2019  . S/P hip hemiarthroplasty   . Small bowel obstruction ([place]) 02/21/2019  . Closed pilon fracture, left, sequela 10/11/2016  . Pain in left ankle and joints of left foot 10/04/2016  . Idiopathic chronic venous hypertension of left lower extremity with ulcer and inflammation ([place]) 10/04/2016  . Fatigue 11/04/2015  . CAD (coronary artery disease) 08/01/2015  . Pulmonary hypertension ([place]) 07/24/2015  . Atrial fibrillation ([place]) 07/16/2015  . Aortic insufficiency 01/04/2014  . Squamous cell carcinoma in situ of skin of right lower leg 11/13/2013  . Lentigo maligna of cheek ([place]) 09/19/2013  . Actinic keratosis 11/06/2012  . Macular degeneration 08/10/2012  . Allergic rhinitis  08/10/2012  . Bladder cancer ([place]) 10/19/2011  . Eczema 02/23/2011  . Hyperglycemia 02/11/2011  . Heart murmur 09/10/2010  . ABNORMAL THYROID FUNCTION TESTS 08/05/2009  . ABNORMAL HEART RHYTHMS 07/11/2009  . Hyperlipidemia 11/28/2008  . CAROTID BRUIT 11/19/2008  . UNSPECIFIED CATARACT 10/17/2008  . PARESTHESIA 12/04/2007  . Varicose veins of bilateral lower extremities with other complications [id number]  . COLONIC POLYPS, HX OF 08/28/2007  . Essential hypertension 02/08/2007  . PROSTATE CANCER, HX OF 11/04/2006  . INGUINAL HERNIA, HX OF 11/04/2006   Past Medical History:  Diagnosis Date  . Arthritis   . Carotid artery stenosis 11/2008   bilateral 40-59% stenosis  . Colon polyps   . Diverticulosis   . Hepatitis    doesn't know which type 1969  . Hyperlipidemia   . Hypertension   . Numbness    fingertips  . Phimosis   . Prostate cancer ([place]) 2001  . Urothelial carcinoma of left distal ureter ([facility])     Family History  Problem Relation Age of Onset  . Colon cancer Neg Hx   . Colon polyps Neg Hx     Past Surgical History:  Procedure Laterality Date  . BIOPSY  10/01/2011   Procedure: BIOPSY;  Surgeon: [name], MD;  [facility];  Service: Urology;;  biopsy of bladder tumor  . BLADDER SUSPENSION  08/18/10   [name]  . CARDIAC CATHETERIZATION N/A 07/29/2015   Procedure: Right/Left Heart Cath and Coronary Angiography;  Surgeon: Peter M Martinique, MD;  Location:  [place] IN[facility]AS[facility] CV LAB;  Service: Cardiovascular;  Laterality: N/A;  . CATARACT EXTRACTION W/ INTRAOCULAR LENS  IMPLANT, BILATERAL Bilateral   . CYSTOSCOPY W/ RETROGRADES  01/27/2012   Procedure: CYSTOSCOPY WITH RETROGRADE PYELOGRAM;  Surgeon: [name], MD;  [facility];  Service: Urology;  Laterality: Left;  . CYSTOSCOPY WITH RETROGRADE PYELOGRAM, URETEROSCOPY AND STENT PLACEMENT Left 11/24/2012   Procedure: CYSTOSCOPY WITH left RETROGRADE PYELOGRAM, bladder washings, ureteroscopy;  Surgeon: [name], MD;   [facility];  Service: Urology;  Laterality: Left;  . CYSTOSCOPY WITH RETROGRADE PYELOGRAM, URETEROSCOPY AND STENT PLACEMENT Bilateral 11/19/2013   Procedure: CYSTOSCOPY WITH BILATERAL RETROGRADE PYELOGRAM,;  Surgeon: [name], MD;  [facility];  Service: Urology;  Laterality: Bilateral;  . EYE SURGERY  09/24/10   left eye. "Retina surgery"  . HIP ARTHROPLASTY Left 03/26/2019   Procedure: ARTHROPLASTY BIPOLAR HIP (HEMIARTHROPLASTY);  Surgeon: Marybelle Killings, MD;  [facility];  Service: Orthopedics;  Laterality: Left;  Depuy press fit, lateral position  . INGUINAL HERNIA REPAIR  1960  . PROSTATE SURGERY     s/p laparoscopic prostate surgery  . SKIN BIOPSY  05/20/2017   Superficial and nodular basal cell carcinoma (left temple)    basal cell carcinoma with sclerosis (right temple)  . 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 SKIN CANCER EXCISION     a/p skin cancer right foot-non melanoma  . skin laceration    . TEE WITHOUT CARDIOVERSION N/A 07/29/2015   Procedure: TRANSESOPHAGEAL ECHOCARDIOGRAM (TEE);  Surgeon: Pixie Casino, MD;  Location: [facility] ENDOSCOPY;  Service: Cardiovascular;  Laterality: N/A;  cath to follow  . TRANSURETHRAL RESECTION OF BLADDER TUMOR  10/01/2011   Procedure: TRANSURETHRAL RESECTION OF BLADDER TUMOR (TURBT);  Surgeon: [name], MD;  [facility];  Service: Urology;  Laterality: N/A;  . TRANSURETHRAL RESECTION OF BLADDER TUMOR N/A 11/19/2013   Procedure: TRANSURETHRAL RESECTION OF BLADDER TUMOR (TURBT);  Surgeon: [name], MD;  [facility];  Service: Urology;  Laterality: N/A;  . URETEROSCOPY  01/27/2012   Procedure: URETEROSCOPY;  Surgeon: [name], MD;  [facility];  Service: Urology;  Laterality: Left;  CYSTO, Left RETROGRADE PYELOGRPHY, LEFT URETEROSCOPY   . URINARY SPHINCTER IMPLANT    . VARICOSE VEIN SURGERY     Social History   Occupational History  . Occupation:  Retired  Tobacco Use  . Smoking status: Never Smoker  . Smokeless tobacco: Never Used  . Tobacco comment: 07/29/2015 "might smoke a cigar once/year"  Substance and Sexual Activity  . Alcohol use: Yes    Alcohol/week: 14.0 standard drinks    Types: 14 Glasses of wine per week    Comment: 07/29/2015 "big glass of wine q night"  . Drug use: No  . Sexual activity: Not on file

## 2019-04-11 ENCOUNTER — Other Ambulatory Visit: Payer: Self-pay | Admitting: *Deleted

## 2019-04-11 NOTE — Patient Outreach (Signed)
Screened for potential [facility] Care Management needs as a benefit of  [employer] Medicare.  Mr. Barder is currently receiving skilled therapy at [facility].   Writer attended telephonic interdisciplinary team meeting to assess for disposition needs and transition plan for resident.   Facility reports member is from home alone. Has some confusion. Has HCPOA who assists with groceries and meds.   Writer will continue to follow for disposition planning needs and for potential [facility] Care Management services.    Marthenia Rolling, MSN-Ed, RN,BSN [place] Acute Care Coordinator [phone] [facility]) [phone]  (Toll free office)

## 2019-04-12 DIAGNOSIS — Z23 Encounter for immunization: Secondary | ICD-10-CM | POA: Diagnosis not present

## 2019-04-13 DIAGNOSIS — H3530 Unspecified macular degeneration: Secondary | ICD-10-CM | POA: Diagnosis not present

## 2019-04-13 DIAGNOSIS — I272 Pulmonary hypertension, unspecified: Secondary | ICD-10-CM | POA: Diagnosis not present

## 2019-04-13 DIAGNOSIS — Z96649 Presence of unspecified artificial hip joint: Secondary | ICD-10-CM | POA: Diagnosis not present

## 2019-04-13 DIAGNOSIS — E870 Hyperosmolality and hypernatremia: Secondary | ICD-10-CM | POA: Diagnosis not present

## 2019-04-13 DIAGNOSIS — R600 Localized edema: Secondary | ICD-10-CM | POA: Diagnosis not present

## 2019-04-13 DIAGNOSIS — I351 Nonrheumatic aortic (valve) insufficiency: Secondary | ICD-10-CM | POA: Diagnosis not present

## 2019-04-13 DIAGNOSIS — M6281 Muscle weakness (generalized): Secondary | ICD-10-CM | POA: Diagnosis not present

## 2019-04-13 DIAGNOSIS — R32 Unspecified urinary incontinence: Secondary | ICD-10-CM | POA: Diagnosis not present

## 2019-04-13 DIAGNOSIS — I10 Essential (primary) hypertension: Secondary | ICD-10-CM | POA: Diagnosis not present

## 2019-04-13 DIAGNOSIS — S72002D Fracture of unspecified part of neck of left femur, subsequent encounter for closed fracture with routine healing: Secondary | ICD-10-CM | POA: Diagnosis not present

## 2019-04-13 DIAGNOSIS — Z760 Encounter for issue of repeat prescription: Secondary | ICD-10-CM | POA: Diagnosis not present

## 2019-04-13 DIAGNOSIS — J069 Acute upper respiratory infection, unspecified: Secondary | ICD-10-CM | POA: Diagnosis not present

## 2019-04-13 DIAGNOSIS — I509 Heart failure, unspecified: Secondary | ICD-10-CM | POA: Diagnosis not present

## 2019-04-13 DIAGNOSIS — I482 Chronic atrial fibrillation, unspecified: Secondary | ICD-10-CM | POA: Diagnosis not present

## 2019-04-13 DIAGNOSIS — Z9181 History of falling: Secondary | ICD-10-CM | POA: Diagnosis not present

## 2019-04-13 DIAGNOSIS — C679 Malignant neoplasm of bladder, unspecified: Secondary | ICD-10-CM | POA: Diagnosis not present

## 2019-04-13 DIAGNOSIS — K56609 Unspecified intestinal obstruction, unspecified as to partial versus complete obstruction: Secondary | ICD-10-CM | POA: Diagnosis not present

## 2019-04-13 DIAGNOSIS — I2510 Atherosclerotic heart disease of native coronary artery without angina pectoris: Secondary | ICD-10-CM | POA: Diagnosis not present

## 2019-04-13 DIAGNOSIS — R2681 Unsteadiness on feet: Secondary | ICD-10-CM | POA: Diagnosis not present

## 2019-04-13 DIAGNOSIS — I69828 Other speech and language deficits following other cerebrovascular disease: Secondary | ICD-10-CM | POA: Diagnosis not present

## 2019-04-13 DIAGNOSIS — I499 Cardiac arrhythmia, unspecified: Secondary | ICD-10-CM | POA: Diagnosis not present

## 2019-04-13 DIAGNOSIS — R1312 Dysphagia, oropharyngeal phase: Secondary | ICD-10-CM | POA: Diagnosis not present

## 2019-04-13 DIAGNOSIS — E785 Hyperlipidemia, unspecified: Secondary | ICD-10-CM | POA: Diagnosis not present

## 2019-04-13 DIAGNOSIS — R41841 Cognitive communication deficit: Secondary | ICD-10-CM | POA: Diagnosis not present

## 2019-04-13 DIAGNOSIS — Z96642 Presence of left artificial hip joint: Secondary | ICD-10-CM | POA: Diagnosis not present

## 2019-04-13 DIAGNOSIS — I4891 Unspecified atrial fibrillation: Secondary | ICD-10-CM | POA: Diagnosis not present

## 2019-04-16 ENCOUNTER — Other Ambulatory Visit: Payer: Self-pay | Admitting: *Deleted

## 2019-04-16 NOTE — Patient Outreach (Signed)
Member screened for potential [facility] Care Management needs as a benefit of [place] [employer].  Jeremy Kelley is receiving skilled therapy at [facility].   Telephone call made to Jeremy Kelley to discuss disposition plans and [facility] Care Management follow up. However, he reports, "I am eating a snack. Call back another time."  Writer will plan outreach to [facility] on file to discuss disposition plans.   Marthenia Rolling, MSN-Ed, RN,BSN [place] Acute Care Coordinator [phone] [facility]) [phone]  (Toll free office)

## 2019-04-17 ENCOUNTER — Encounter: Payer: Self-pay | Admitting: Internal Medicine

## 2019-04-17 ENCOUNTER — Non-Acute Institutional Stay (SKILLED_NURSING_FACILITY): Payer: Medicare Other | Admitting: Internal Medicine

## 2019-04-17 ENCOUNTER — Other Ambulatory Visit: Payer: Self-pay | Admitting: *Deleted

## 2019-04-17 DIAGNOSIS — I482 Chronic atrial fibrillation, unspecified: Secondary | ICD-10-CM | POA: Diagnosis not present

## 2019-04-17 DIAGNOSIS — R600 Localized edema: Secondary | ICD-10-CM

## 2019-04-17 DIAGNOSIS — I10 Essential (primary) hypertension: Secondary | ICD-10-CM

## 2019-04-17 NOTE — Patient Outreach (Addendum)
Member screened for potential [facility] Care Management needs as a benefit of [place] Medicare.  Mr. Kuczkowski is currently receiving skilled therapy at [facility].   Telephone call made to Mr. [place] at [phone]. Patient identifiers confirmed.  [name] Coil reports that member lives alone. [name] Coil and his wife [name] are Power of Attorneys for member. [name] is also a Marine scientist. [name] have been primary caregivers for Mr. Cunningham. Mr. Shareef lived alone independently prior.  He does not have any family. Win Sheppard Coil "is more like a son" to member. [name] have been very supportive of member and assist him with his affairs.  [name] Coil reports that member will have a friend stay with him for 2 weeks 24/7 post SNF discharge. [name] Coil states he received information from the facility for life alert systems. States he intends on speaking with member about it. It does not appear Mr. Piekarski has been interested in ALF and wishes to return home.  [name] Coil is interested in discussing mobile meals program with [facility] Social Worker. He is agreeable to [place] Management follow from [place] [facility] [place] post SNF discharge.  Win Alexander contact information is [phone]. Will plan to make [facility] referral upon SNF discharge. Likely dc next week. Will plan on making [facility] Social Worker referral now for discussion of meals program.   Provided [name] Coil with [place] Management and writer's contact information.  Will continue to follow for disposition plans while Mr. Sahli remains in SNF.    Marthenia Rolling, MSN-Ed, RN,BSN [place] Acute Care Coordinator [phone] [facility]) [phone]  (Toll free office)

## 2019-04-17 NOTE — Progress Notes (Signed)
Location:  [facility] Room Number: 101-P Place of Service:  SNF (31) Provider:  Granville Lewis  Patient Care Team: [name], NP as PCP - General (Family Medicine) Nahser, [name], MD as PCP - Cardiology (Cardiology) Bjorn Loser, MD as Consulting Physician (Urology) [facility] as Physical Therapist (Physical Therapy)  Extended Emergency Contact Information Primary Emergency Contact: wynn,alexander Mobile Phone: [phone] Relation: Other Secondary Emergency Contact: Tomie China, [place] [address] [place] [employer] Phone: [phone] Relation: Other  Code Status:  DNR Goals of care: Advanced Directive information Advanced Directives 04/17/2019  Does Patient Have a Medical Advance Directive? Yes  Type of Advance Directive Out of facility DNR (pink MOST or yellow form)  Does patient want to make changes to medical advance directive? No - Patient declined  Copy of [place] in Chart? -  Would patient like information on creating a medical advance directive? -  Pre-existing out of facility DNR order (yellow form or pink MOST form) Yellow form placed in chart (order not valid for inpatient use)     Chief Complaint  Patient presents with  . Acute Visit    Seen for left leg swelling    HPI:  Pt is a 86 y.o. male seen today for an acute visit for left leg swelling.  He is not complaining of any pain.  He does have a history of a recent left hip fracture that was surgically repaired.  He apparently is doing well with physical therapy.  He has had some increased edema of his left leg he is on Eliquis 2.5 mg twice daily he is also on this with a history of atrial fibrillation.  He is not giving any history of trauma to the left leg does not really complain of any pain with the edema.  He does continue on Demadex 20 mg a day     Past Medical History:  Diagnosis Date  . Arthritis    . Carotid artery stenosis 11/2008   bilateral 40-59% stenosis  . Colon polyps   . Diverticulosis   . Hepatitis    doesn't know which type 1969  . Hyperlipidemia   . Hypertension   . Numbness    fingertips  . Phimosis   . Prostate cancer ([place]) 2001  . Urothelial carcinoma of left distal ureter [facility])    Past Surgical History:  Procedure Laterality Date  . BIOPSY  10/01/2011   Procedure: BIOPSY;  Surgeon: [name], MD;  Location: [facility]S;  Service: Urology;;  biopsy of bladder tumor  . BLADDER SUSPENSION  08/18/10   [name]  . CARDIAC CATHETERIZATION N/A 07/29/2015   Procedure: Right/Left Heart Cath and Coronary Angiography;  Surgeon: Peter M Martinique, MD;  Location: [place] CV LAB;  Service: Cardiovascular;  Laterality: N/A;  . CATARACT EXTRACTION W/ INTRAOCULAR LENS  IMPLANT, BILATERAL Bilateral   . CYSTOSCOPY W/ RETROGRADES  01/27/2012   Procedure: CYSTOSCOPY WITH RETROGRADE PYELOGRAM;  Surgeon: [name], MD;  Location: [facility];  Service: Urology;  Laterality: Left;  . CYSTOSCOPY WITH RETROGRADE PYELOGRAM, URETEROSCOPY AND STENT PLACEMENT Left 11/24/2012   Procedure: CYSTOSCOPY WITH left RETROGRADE PYELOGRAM, bladder washings, ureteroscopy;  Surgeon: [name], MD;  Location: [facility];  Service: Urology;  Laterality: Left;  . CYSTOSCOPY WITH RETROGRADE PYELOGRAM, URETEROSCOPY AND STENT PLACEMENT Bilateral 11/19/2013   Procedure: CYSTOSCOPY WITH BILATERAL RETROGRADE PYELOGRAM,;  Surgeon: [name], MD;  Location: [facility];  Service: Urology;  Laterality: Bilateral;  . EYE SURGERY  09/24/10   left eye. "Retina surgery"  . HIP ARTHROPLASTY Left 03/26/2019   Procedure: ARTHROPLASTY BIPOLAR HIP (HEMIARTHROPLASTY);  Surgeon: Marybelle Killings, MD;  Location: [facility];  Service: Orthopedics;  Laterality: Left;  Depuy press fit, lateral position  . INGUINAL HERNIA REPAIR  1960  . PROSTATE SURGERY     s/p laparoscopic prostate surgery  . SKIN BIOPSY  05/20/2017   Superficial and nodular  basal cell carcinoma (left temple)    basal cell carcinoma with sclerosis (right temple)  . 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 SKIN CANCER EXCISION     a/p skin cancer right foot-non melanoma  . skin laceration    . TEE WITHOUT CARDIOVERSION N/A 07/29/2015   Procedure: TRANSESOPHAGEAL ECHOCARDIOGRAM (TEE);  Surgeon: Pixie Casino, MD;  Location: [facility] ENDOSCOPY;  Service: Cardiovascular;  Laterality: N/A;  cath to follow  . TRANSURETHRAL RESECTION OF BLADDER TUMOR  10/01/2011   Procedure: TRANSURETHRAL RESECTION OF BLADDER TUMOR (TURBT);  Surgeon: [name], MD;  [facility];  Service: Urology;  Laterality: N/A;  . TRANSURETHRAL RESECTION OF BLADDER TUMOR N/A 11/19/2013   Procedure: TRANSURETHRAL RESECTION OF BLADDER TUMOR (TURBT);  Surgeon: [name], MD;  [facility];  Service: Urology;  Laterality: N/A;  . URETEROSCOPY  01/27/2012   Procedure: URETEROSCOPY;  Surgeon: [name], MD;  [facility];  Service: Urology;  Laterality: Left;  CYSTO, Left RETROGRADE PYELOGRPHY, LEFT URETEROSCOPY   . URINARY SPHINCTER IMPLANT    . VARICOSE VEIN SURGERY      No Known Allergies  Outpatient Encounter Medications as of 04/17/2019  Medication Sig  . acetaminophen (TYLENOL) 325 MG tablet Take 1-2 tablets (325-650 mg total) by mouth every 6 (six) hours as needed for mild pain or moderate pain (pain score 1-3 or temp > 100.5).  . B Complex-C (B-COMPLEX WITH VITAMIN C) tablet Take 1 tablet by mouth daily. Reported on 07/24/2015  . bisacodyl (DULCOLAX) 10 MG suppository Place 10 mg rectally daily as needed for moderate constipation. If not relieved by MOM  . cholecalciferol (VITAMIN D) 1000 units tablet Take 1 tablet (1,000 Units total) by mouth daily.  [name] docusate sodium (COLACE) 100 MG capsule Take 1 capsule (100 mg total) by mouth 2 (two) times daily.  [name] donepezil (ARICEPT) 5 MG tablet Take 1/2 tablet daily for 2 weeks, then  increase to 1 tablet daily  . ELIQUIS 2.5 MG TABS tablet TAKE 1 TABLET BY MOUTH TWICE A DAY  . fluorometholone (FML) 0.1 % ophthalmic suspension Place 1 drop into both eyes 4 (four) times daily.  . fluticasone (FLONASE) 50 MCG/ACT nasal spray Place 2 sprays into both nostrils at bedtime.  [name] ipratropium (ATROVENT) 0.06 % nasal spray Place 2 sprays into the nose 2 (two) times daily as needed. Nasal drainage  . magnesium hydroxide (MILK OF MAGNESIA) 400 MG/5ML suspension Take 30 mLs by mouth daily as needed for mild constipation.  . metoprolol succinate (TOPROL-XL) 25 MG 24 hr tablet TAKE 1 TABLET BY MOUTH EVERYDAY AT BEDTIME  . mirabegron ER (MYRBETRIQ) 50 MG TB24 tablet Take 1 tablet (50 mg total) by mouth daily.  . Nutritional Supplement LIQD Take 120 mLs by mouth daily. MedPass  . phosphorus (K PHOS NEUTRAL) 155-852-130 MG tablet Take 2 tablets (500 mg total) by mouth 2 (two) times daily.  . polyethylene glycol (MIRALAX / GLYCOLAX) 17 g packet  Take 17 g by mouth daily as needed for mild constipation.   . potassium chloride (MICRO-K) 10 MEQ CR capsule Take 10 mEq by mouth daily.  . pravastatin (PRAVACHOL) 20 MG tablet TAKE 1 TABLET BY MOUTH EVERYDAY AT BEDTIME  . Sodium Phosphates (RA SALINE ENEMA RE) Place 1 each rectally daily as needed. Constipation not relieved by bisacodyl  . torsemide (DEMADEX) 20 MG tablet Take 1 tablet (20 mg total) by mouth daily.  [name] XIIDRA 5 % SOLN Place 1 drop into both eyes 2 (two) times a day.   No facility-administered encounter medications on file as of 04/17/2019.    Review of Systems  General the he not complaining of any fever or chills.  Skin does not complain of rashes or itching he does have venous stasis changes of his legs bilaterally.  Head ears eyes nose mouth and throat does not complain of visual changes or sore throat.  Respiratory does not complain of shortness of breath or cough.  Cardiac does not complain of chest pain does have some edema of  his left leg mild edema of his right leg which appears to be baseline.  GI does not complain of abdominal discomfort nausea vomiting diarrhea constipation.  GU is not complaining of dysuria.  Musculoskeletal does not really complain of pain.  Neurologic does not complain of dizziness headache or increased numbness or syncope.  And psych is not complaining of being depressed or anxious.     Immunization History  Administered Date(s) Administered  . Influenza Split 02/08/2011  . Influenza Whole 02/09/2007  . Influenza, High Dose Seasonal PF 02/02/2017  . Influenza,inj,Quad PF,6+ Mos 03/21/2014, 12/06/2014  . Influenza-Unspecified 01/11/2019  . Pneumococcal Conjugate-13 12/06/2014  . Pneumococcal Polysaccharide-23 02/09/2007  . Td 04/12/1995  . Tdap 02/01/2012, 12/16/2015   Pertinent  Health Maintenance Due  Topic Date Due  . INFLUENZA VACCINE  Completed  . PNA vac Low Risk Adult  Completed   Fall Risk  02/26/2019 10/02/2014 08/03/2013  Falls in the past year? 0 No No  Number falls in past yr: 0 - -  Injury with Fall? 0 - -  Follow up Falls evaluation completed - -   Functional Status Survey:    Vitals:   04/17/19 1046  BP: 132/72  Pulse: (!) 51  Resp: 18  Temp: (!) 97.5 F (36.4 C)  TempSrc: Oral  Weight: 125 lb 12.8 oz (57.1 kg)  Height: 5\' 9"  (1.753 m)  Pulse on auscultation was in the mid 60s Body mass index is 18.58 kg/m. Physical Exam In general this is a pleasant elderly male in no distress.  His skin is warm and dry he does have venous stasis changes of his lower extremities bilaterally.  Eyes visual acuity appears to be intact sclera and conjunctive are clear he has prescription lenses.  Oropharynx is clear mucous membranes moist.  Chest is clear to auscultation with somewhat shallow air entry there is no labored breathing.  Heart is regular irregular rate and rhythm in the 60s he does have a systolic murmur.  He does have some edema bilaterally  more so of his right foot and does have increased edema it appears of his left lower leg and foot this is cool to touch not really erythematous he does have venous stasis changes.  There is not really significant tenderness to palpation or concerning warmth.  Abdomen is soft nontender with positive bowel sounds.  Musculoskeletal does move all extremities x4 it appears at baseline he has  worked with therapy and apparently doing fairly well.  Neurologic is grossly intact cannot appreciate lateralizing findings his speech is clear.  Psych he is pleasant and appropriate does have some mild cognitive deficits but is largely alert and oriented.   Labs reviewed:  April 09, 2019.  WBC 6.4 hemoglobin 10.8 platelets 300.  Sodium 145 potassium 3.5 BUN 16.1 creatinine 0.85   Recent Labs    03/25/19 0601 03/27/19 0459 03/28/19 0500 04/02/19 0000  NA 141 136 137 145  K 4.6 4.1 4.6 3.7  CL 96* 99 102 107  CO2 34* 29 28 28*  GLUCOSE 118* 112* 117*  --   BUN 39* 27* 28* 18  CREATININE 1.15 0.99 0.98 0.9  CALCIUM 9.1 8.8* 9.1 8.8  MG  --   --  2.1  --   PHOS  --   --  1.7*  --    Recent Labs    08/31/18 1247 02/21/19 1100  AST 23 31  ALT 14 17  ALKPHOS 53 79  BILITOT 0.9 1.6*  PROT 6.3 7.0  ALBUMIN 3.9 4.0   Recent Labs    02/27/19 1358 03/25/19 0601 03/26/19 0449 03/27/19 0459 03/28/19 0500 03/29/19 0440 04/02/19 0000  WBC 7.0 14.2* 12.7* 15.2* 14.4*  --  9.5  NEUTROABS 5.2 12.4*  --   --   --   --  8  HGB 12.8* 14.3 13.5 12.3* 11.9* 11.4* 11.4*  HCT 37.5* 43.9 43.3 38.8* 37.3* 35.1* 33*  MCV 95.7 100.2* 103.6* 101.6* 99.5  --   --   PLT 196.0 237 217 186 182  --  225   Lab Results  Component Value Date   TSH 0.737 01/22/2019   Lab Results  Component Value Date   HGBA1C 5.9 (H) 02/22/2011   Lab Results  Component Value Date   CHOL 135 09/14/2013   HDL 47.00 09/14/2013   LDLCALC 76 09/14/2013   TRIG 62.0 09/14/2013   CHOLHDL 3 09/14/2013     Significant Diagnostic Results in last 30 days:  DG Lumbar Spine Complete  Result Date: 03/25/2019 CLINICAL DATA:  Fall, hip pain EXAM: LUMBAR SPINE - COMPLETE 4+ VIEW COMPARISON:  None. FINDINGS: Normal alignment of the vertebral bodies. There is severe joint space narrowing from L2-S1. No loss vertebral body height. Endplate osteophytosis. IMPRESSION: No acute findings lumbar spine. Multilevel disc osteophytic disease and joint space narrowing. Electronically Signed   By: Suzy Bouchard M.D.   On: 03/25/2019 05:01   CT Head Wo Contrast  Result Date: 03/25/2019 CLINICAL DATA:  Minor trauma EXAM: CT HEAD WITHOUT CONTRAST TECHNIQUE: Contiguous axial images were obtained from the base of the skull through the vertex without intravenous contrast. COMPARISON:  None. FINDINGS: Brain: No acute intracranial hemorrhage. No focal mass lesion. No CT evidence of acute infarction. No midline shift or mass effect. No hydrocephalus. Basilar cisterns are patent. There are periventricular and subcortical white matter hypodensities. Generalized cortical atrophy. Vascular: No hyperdense vessel or unexpected calcification. Skull: Normal. Negative for fracture or focal lesion. Sinuses/Orbits: Paranasal sinuses and mastoid air cells are clear. Orbits are clear. A polypoid mucosal lesion the LEFT ethmoid sinus not changed from comparison MRI. Consistent with mucous retention cyst or polyp. Other: None. IMPRESSION: 1. No acute intracranial findings. 2. Atrophy and white matter microvascular disease. Electronically Signed   By: Suzy Bouchard M.D.   On: 03/25/2019 04:54   Pelvis Portable  Result Date: 03/26/2019 CLINICAL DATA:  Status post left hip replacement. EXAM: PORTABLE PELVIS  1-2 VIEWS COMPARISON:  None. FINDINGS: Left hip prosthesis in satisfactory position and alignment. No fracture or dislocation seen. The bones are diffusely osteopenic. IMPRESSION: Satisfactory appearance of a left hip prosthesis.  Electronically Signed   By: Claudie Revering M.D.   On: 03/26/2019 13:35   DG Chest Port 1 View  Result Date: 03/25/2019 CLINICAL DATA:  Preoperative chest radiograph EXAM: PORTABLE CHEST 1 VIEW COMPARISON:  None. FINDINGS: Moderate cardiomegaly. Lungs are mildly hyperinflated. Pulmonary arteries are prominent. No sizable pleural effusion. No focal airspace consolidation or pulmonary edema. IMPRESSION: 1. No active cardiopulmonary disease. 2. Moderate cardiomegaly. Electronically Signed   By: Ulyses Jarred M.D.   On: 03/25/2019 05:40   DG Knee Complete 4 Views Left  Result Date: 03/25/2019 CLINICAL DATA:  Left hip pain.  Fall. EXAM: LEFT KNEE - COMPLETE 4+ VIEW COMPARISON:  None. FINDINGS: No evidence of fracture, dislocation, or joint effusion. No evidence of arthropathy or other focal bone abnormality. Soft tissues are unremarkable. IMPRESSION: Negative. Electronically Signed   By: Ulyses Jarred M.D.   On: 03/25/2019 05:09   DG Hip Unilat W or Wo Pelvis 2-3 Views Left  Result Date: 03/25/2019 CLINICAL DATA:  Fall, hip pain EXAM: DG HIP (WITH OR WITHOUT PELVIS) 2-3V LEFT COMPARISON:  None. FINDINGS: Subcapital fracture of the LEFT femoral neck with varus angulation. No dislocation. There is some proximal migration of the distal fracture segment. IMPRESSION: Subcapital fracture of the LEFT femoral neck. Electronically Signed   By: Suzy Bouchard M.D.   On: 03/25/2019 04:59   XR HIP UNILAT W OR W/O PELVIS 2-3 VIEWS LEFT  Result Date: 04/10/2019 AP frog-leg x-rays left hip demonstrates bipolar hemiarthroplasty in satisfactory position alignment leg lengths are equal by radiographs. Impression: Satisfactory left hip hemiarthroplasty.   Assessment/Plan  #1 history of left leg edema-we will check a venous Doppler to rule out possibility of DVT status post hip surgery-he is on Eliquis 2.5 mg twice daily for anticoagulation-he is not really having significant pain with this I do not see evidence of  cellulitis but will need to be monitored.  2.  History of atrial fibrillation this appears rate controlled on Toprol XL 25 mg a day he is on Eliquis 2.5 mg a day for anticoagulation.  3.  History of hypertension he continues on Toprol he is also on Demadex 20 mg a day at this point appears to be stable blood pressure today 132/72.  Will update a CBC and metabolic panel to keep an eye on his hemoglobin which was most recently 10.8 as well as electrolytes sodium was 145 potassium 3.5 on most recent lab.  Addendum.  We have received results of the Doppler which are reassuring and does not show evidence of a DVT-we have encouraged leg elevation at this point will monitor.  [id number].      Granville Lewis, PA-C [phone]

## 2019-04-18 ENCOUNTER — Other Ambulatory Visit: Payer: Self-pay | Admitting: *Deleted

## 2019-04-18 LAB — BASIC METABOLIC PANEL
BUN: 16 (ref 4–21)
CO2: 30 — AB (ref 13–22)
Chloride: 105 (ref 99–108)
Creatinine: 1.1 (ref 0.6–1.3)
Glucose: 106
Potassium: 4.2 (ref 3.4–5.3)
Sodium: 145 (ref 137–147)

## 2019-04-18 LAB — CBC AND DIFFERENTIAL
HCT: 36 — AB (ref 41–53)
Hemoglobin: 11.9 — AB (ref 13.5–17.5)
Neutrophils Absolute: 5
Platelets: 270 (ref 150–399)
WBC: 6.2

## 2019-04-18 LAB — COMPREHENSIVE METABOLIC PANEL
Calcium: 9.4 (ref 8.7–10.7)
GFR calc Af Amer: 70.33
GFR calc non Af Amer: 60.68

## 2019-04-18 LAB — CBC: RBC: 3.77 — AB (ref 3.87–5.11)

## 2019-04-18 NOTE — Patient Outreach (Signed)
Screened for potential [facility] Care Management needs as a benefit of  [employer].  Member is currently receiving skilled therapy at [facility].   Writer attended telephonic interdisciplinary team meeting earlier today to assess for disposition needs and transition plan for resident.   Facility reports Mr. Exley likely transition with home health early next week. Will confirm home health agency closer to dc. Facility dc planner states member likely to have [place].   [facility] Care Management referral made. Will alert [facility] Care Management RNCM upon Mr. Stones's transition to home from SNF.   Marthenia Rolling, MSN-Ed, RN,BSN [place] Acute Care Coordinator [phone] [facility]) [phone]  (Toll free office)

## 2019-04-19 ENCOUNTER — Encounter: Payer: Self-pay | Admitting: Internal Medicine

## 2019-04-19 ENCOUNTER — Non-Acute Institutional Stay (SKILLED_NURSING_FACILITY): Payer: Medicare Other | Admitting: Internal Medicine

## 2019-04-19 DIAGNOSIS — E870 Hyperosmolality and hypernatremia: Secondary | ICD-10-CM

## 2019-04-19 DIAGNOSIS — R600 Localized edema: Secondary | ICD-10-CM | POA: Diagnosis not present

## 2019-04-19 NOTE — Progress Notes (Signed)
Location:  [place] Room Number: 101-P Place of Service:  SNF [phone]) Provider:  Granville Lewis, PA-C  Patient Care Team: [name], NP as PCP - General (Family Medicine) Nahser, [name], MD as PCP - Cardiology (Cardiology) Bjorn Loser, MD as Consulting Physician (Urology) [facility] as Physical Therapist (Physical Therapy) Tobi Bastos, RN as [place] [facility]  Extended Emergency Contact Information Primary Emergency Contact: Jeremy Kelley Mobile Phone: [phone] Relation: Other Secondary Emergency Contact: Jeremy Kelley,  [id number] Jeremy Kelley of [employer] Phone: [phone] Relation: Other  Code Status:  DNR Goals of care: Advanced Directive information Advanced Directives 04/19/2019  Does Patient Have a Medical Advance Directive? Yes  Type of Advance Directive Out of facility DNR (pink MOST or yellow form)  Does patient want to make changes to medical advance directive? No - Patient declined  Copy of [place] in Chart? -  Would patient like information on creating a medical advance directive? -  Pre-existing out of facility DNR order (yellow form or pink MOST form) Yellow form placed in chart (order not valid for inpatient use)     Chief complaint-acute visit follow-up left leg edema-labs  HPI:  Pt is a 86 y.o. male seen today for an acute visit for follow-up of left leg edema as well as recent labs.  Patient is status post repair of a left hip fracture.  And has done fairly well with therapy but developed some increased edema of his left leg-per nursing the edema has been there for a while but he is concerned about it.  He is on Eliquis 2.5 mg twice daily for anticoagulation he is also on this for history of atrial fibrillation.  Earlier this week we did do a Doppler ultrasound of the left leg which did not show any evidence of a DVT.  He is not  complaining of any pain.  He says the edema is somewhat better after he has his leg elevated in bed overnight.  He continues on Demadex 20 mg a day-we ordered labs because his last sodium was 145 which is borderline high normal-update lab has shown stability however with sodium still 145 it has not risen which is encouraging.  He does not really have any other complaints this evening.  Apparently he will be going home early next week    Past Medical History:  Diagnosis Date  . Arthritis   . Carotid artery stenosis 11/2008   bilateral 40-59% stenosis  . Colon polyps   . Diverticulosis   . Hepatitis    doesn't know which type 1969  . Hyperlipidemia   . Hypertension   . Numbness    fingertips  . Phimosis   . Prostate cancer ([place]) 2001  . Urothelial carcinoma of left distal ureter [facility])    Past Surgical History:  Procedure Laterality Date  . BIOPSY  10/01/2011   Procedure: BIOPSY;  Surgeon: [name], MD;  Location: [facility];  Service: Urology;;  biopsy of bladder tumor  . BLADDER SUSPENSION  08/18/10   [name]  . CARDIAC CATHETERIZATION N/A 07/29/2015   Procedure: Right/Left Heart Cath and Coronary Angiography;  Surgeon: Peter M Martinique, MD;  Location: [place] [facility];  Service: Cardiovascular;  Laterality: N/A;  . CATARACT EXTRACTION W/ INTRAOCULAR LENS  IMPLANT, BILATERAL Bilateral   . CYSTOSCOPY W/ RETROGRADES  01/27/2012   Procedure: CYSTOSCOPY WITH RETROGRADE PYELOGRAM;  Surgeon: Romilda Joy  Alinda Money, MD;  Location: [facility];  Service: Urology;  Laterality: Left;  . CYSTOSCOPY WITH RETROGRADE PYELOGRAM, URETEROSCOPY AND STENT PLACEMENT Left 11/24/2012   Procedure: CYSTOSCOPY WITH left RETROGRADE PYELOGRAM, bladder washings, ureteroscopy;  Surgeon: [name], MD;  Location: [facility];  Service: Urology;  Laterality: Left;  . CYSTOSCOPY WITH RETROGRADE PYELOGRAM, URETEROSCOPY AND STENT PLACEMENT Bilateral 11/19/2013   Procedure: CYSTOSCOPY WITH BILATERAL RETROGRADE PYELOGRAM,;  Surgeon:  [name], MD;  Location: [facility];  Service: Urology;  Laterality: Bilateral;  . EYE SURGERY  09/24/10   left eye. "Retina surgery"  . HIP ARTHROPLASTY Left 03/26/2019   Procedure: ARTHROPLASTY BIPOLAR HIP (HEMIARTHROPLASTY);  Surgeon: Marybelle Killings, MD;  Location: [facility];  Service: Orthopedics;  Laterality: Left;  Depuy press fit, lateral position  . INGUINAL HERNIA REPAIR  1960  . PROSTATE SURGERY     s/p laparoscopic prostate surgery  . SKIN BIOPSY  05/20/2017   Superficial and nodular basal cell carcinoma (left temple)    basal cell carcinoma with sclerosis (right temple)  . 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 SKIN CANCER EXCISION     a/p skin cancer right foot-non melanoma  . skin laceration    . TEE WITHOUT CARDIOVERSION N/A 07/29/2015   Procedure: TRANSESOPHAGEAL ECHOCARDIOGRAM (TEE);  Surgeon: Pixie Casino, MD;  Location: [facility];  Service: Cardiovascular;  Laterality: N/A;  cath to follow  . TRANSURETHRAL RESECTION OF BLADDER TUMOR  10/01/2011   Procedure: TRANSURETHRAL RESECTION OF BLADDER TUMOR (TURBT);  Surgeon: [name], MD;  Location: [facility];  Service: Urology;  Laterality: N/A;  . TRANSURETHRAL RESECTION OF BLADDER TUMOR N/A 11/19/2013   Procedure: TRANSURETHRAL RESECTION OF BLADDER TUMOR (TURBT);  Surgeon: [name], MD;  Location: [facility];  Service: Urology;  Laterality: N/A;  . URETEROSCOPY  01/27/2012   Procedure: URETEROSCOPY;  Surgeon: [name], MD;  Location: [facility];  Service: Urology;  Laterality: Left;  CYSTO, Left RETROGRADE PYELOGRPHY, LEFT URETEROSCOPY   . URINARY SPHINCTER IMPLANT    . VARICOSE VEIN SURGERY      No Known Allergies  Outpatient Encounter Medications as of 04/19/2019  Medication Sig  . acetaminophen (TYLENOL) 325 MG tablet Take 1-2 tablets (325-650 mg total) by mouth every 6 (six) hours as needed for mild pain or moderate pain (pain score 1-3 or temp > 100.5).  . B  Complex-C (B-COMPLEX WITH VITAMIN C) tablet Take 1 tablet by mouth daily. Reported on 07/24/2015  . bisacodyl (DULCOLAX) 10 MG suppository Place 10 mg rectally daily as needed for moderate constipation. If not relieved by MOM  . cholecalciferol (VITAMIN D) 1000 units tablet Take 1 tablet (1,000 Units total) by mouth daily.  [name] docusate sodium (COLACE) 100 MG capsule Take 1 capsule (100 mg total) by mouth 2 (two) times daily.  [name] donepezil (ARICEPT) 5 MG tablet Take 1/2 tablet daily for 2 weeks, then increase to 1 tablet daily  . ELIQUIS 2.5 MG TABS tablet TAKE 1 TABLET BY MOUTH TWICE A DAY  . fluorometholone (FML) 0.1 % ophthalmic suspension Place 1 drop into both eyes 4 (four) times daily.  . fluticasone (FLONASE) 50 MCG/ACT nasal spray Place 2 sprays into both nostrils at bedtime.  [name] ipratropium (ATROVENT) 0.06 % nasal spray Place 2 sprays into the nose 2 (two) times daily as needed. Nasal drainage  . magnesium hydroxide (MILK OF MAGNESIA) 400 MG/5ML suspension Take 30 mLs by mouth daily as needed for mild  constipation.  . metoprolol succinate (TOPROL-XL) 25 MG 24 hr tablet TAKE 1 TABLET BY MOUTH EVERYDAY AT BEDTIME  . mirabegron ER (MYRBETRIQ) 50 MG TB24 tablet Take 1 tablet (50 mg total) by mouth daily.  . Nutritional Supplement LIQD Take 120 mLs by mouth daily. MedPass  . phosphorus (K PHOS NEUTRAL) 155-852-130 MG tablet Take 2 tablets (500 mg total) by mouth 2 (two) times daily.  . polyethylene glycol (MIRALAX / GLYCOLAX) 17 g packet Take 17 g by mouth daily as needed for mild constipation.   . potassium chloride (MICRO-K) 10 MEQ CR capsule Take 10 mEq by mouth daily.  . pravastatin (PRAVACHOL) 20 MG tablet TAKE 1 TABLET BY MOUTH EVERYDAY AT BEDTIME  . Sodium Phosphates (RA SALINE ENEMA RE) Place 1 each rectally daily as needed. Constipation not relieved by bisacodyl  . torsemide (DEMADEX) 20 MG tablet Take 1 tablet (20 mg total) by mouth daily.  [name] XIIDRA 5 % SOLN Place 1 drop into both eyes 2  (two) times a day.   No facility-administered encounter medications on file as of 04/19/2019.    Review of Systems   General is not complaining of any fever or chills.  Skin does not complain of rashes or itching he does have venous stasis changes lower extremities bilaterally.  Head ears eyes nose mouth and throat does not complain of visual changes or sore throat.  Respiratory denies increased shortness of breath or cough.  Cardiac does not complain of chest pain does have left lower leg edema and some mild right lower leg edema which apparently is relatively baseline.  GI is not complaining of abdominal discomfort nausea vomiting diarrhea constipation.  GU no complaints of dysuria.  Musculoskeletal does not complain of pain at this time.  Neurologic is not complaining of dizziness headache or numbness.  And psych does not complain of being depressed or anxious.    Immunization History  Administered Date(s) Administered  . Influenza Split 02/08/2011  . Influenza Whole 02/09/2007  . Influenza, High Dose Seasonal PF 02/02/2017  . Influenza,inj,Quad PF,6+ Mos 03/21/2014, 12/06/2014  . Influenza-Unspecified 01/11/2019  . Pneumococcal Conjugate-13 12/06/2014  . Pneumococcal Polysaccharide-23 02/09/2007  . Td 04/12/1995  . Tdap 02/01/2012, 12/16/2015   Pertinent  Health Maintenance Due  Topic Date Due  . INFLUENZA VACCINE  Completed  . PNA vac Low Risk Adult  Completed   Fall Risk  02/26/2019 10/02/2014 08/03/2013  Falls in the past year? 0 No No  Number falls in past yr: 0 - -  Injury with Fall? 0 - -  Follow up Falls evaluation completed - -   Functional Status Survey:    Vitals:   04/19/19 1540  BP: 132/61  Pulse: 68  Resp: 18  Temp: 98.3 F (36.8 C)  TempSrc: Oral  Weight: 125 lb 12.8 oz (57.1 kg)  Height: 5\' 9"  (1.753 m)   Body mass index is 18.58 kg/m. Physical Exam In general this is a pleasant elderly male in no distress.  His skin is warm and  dry he does have venous stasis changes lower extremities bilaterally.  Eyes visual acuity appears to be intact sclera and conjunctive are clear.  Oropharynx is clear mucous membranes moist.  Chest is clear to auscultation with somewhat shallow air entry there is no labored breathing.  Heart is regular irregular rate and rhythm with a baseline systolic murmur-he continues to have some edema more so of his right foot which is baseline and left lower leg edema this does  not really increased warm to touch or tender-he does have venous stasis changes-- pedal pulse appears to be intact Abdomen is soft nontender with positive bowel sounds.  Musculoskeletal is able to move all extremities x4-does ambulate with a walker-left leg description please see Olga Millers continues to have some left lower leg edema most prominently this is not increased from previous exam.  Neurologic appears grossly intact his speech is clear could not appreciate lateralizing findings.  Psych he is pleasant and appropriate has some very mild cognitive deficits but is largely alert and oriented.   Labs reviewed: Recent Labs    03/25/19 0601 03/27/19 0459 03/28/19 0500 04/02/19 0000 04/09/19 0000 04/18/19 0000  NA 141 136 137 145 145 145  K 4.6 4.1 4.6 3.7 3.5 4.2  CL 96* 99 102 107 105 105  CO2 34* 29 28 28* 32* 30*  GLUCOSE 118* 112* 117*  --   --   --   BUN 39* 27* 28* 18 16 16   CREATININE 1.15 0.99 0.98 0.9 0.9 1.1  CALCIUM 9.1 8.8* 9.1 8.8 8.9 9.4  MG  --   --  2.1  --   --   --   PHOS  --   --  1.7*  --   --   --    Recent Labs    08/31/18 1247 02/21/19 1100  AST 23 31  ALT 14 17  ALKPHOS 53 79  BILITOT 0.9 1.6*  PROT 6.3 7.0  ALBUMIN 3.9 4.0   Recent Labs    03/26/19 0449 03/27/19 0459 03/28/19 0500 04/02/19 0000 04/09/19 0000 04/18/19 0000  WBC 12.7* 15.2* 14.4* 9.5 6.4 6.2  NEUTROABS  --   --   --  8 5 5   HGB 13.5 12.3* 11.9* 11.4* 10.8* 11.9*  HCT 43.3 38.8* 37.3* 33* 31* 36*  MCV  103.6* 101.6* 99.5  --   --   --   PLT 217 186 182 225 300 270   Lab Results  Component Value Date   TSH 0.737 01/22/2019   Lab Results  Component Value Date   HGBA1C 5.9 (H) 02/22/2011   Lab Results  Component Value Date   CHOL 135 09/14/2013   HDL 47.00 09/14/2013   LDLCALC 76 09/14/2013   TRIG 62.0 09/14/2013   CHOLHDL 3 09/14/2013    Significant Diagnostic Results in last 30 days:  DG Lumbar Spine Complete  Result Date: 03/25/2019 CLINICAL DATA:  Fall, hip pain EXAM: LUMBAR SPINE - COMPLETE 4+ VIEW COMPARISON:  None. FINDINGS: Normal alignment of the vertebral bodies. There is severe joint space narrowing from L2-S1. No loss vertebral body height. Endplate osteophytosis. IMPRESSION: No acute findings lumbar spine. Multilevel disc osteophytic disease and joint space narrowing. Electronically Signed   By: Suzy Bouchard M.D.   On: 03/25/2019 05:01   CT Head Wo Contrast  Result Date: 03/25/2019 CLINICAL DATA:  Minor trauma EXAM: CT HEAD WITHOUT CONTRAST TECHNIQUE: Contiguous axial images were obtained from the base of the skull through the vertex without intravenous contrast. COMPARISON:  None. FINDINGS: Brain: No acute intracranial hemorrhage. No focal mass lesion. No CT evidence of acute infarction. No midline shift or mass effect. No hydrocephalus. Basilar cisterns are patent. There are periventricular and subcortical white matter hypodensities. Generalized cortical atrophy. Vascular: No hyperdense vessel or unexpected calcification. Skull: Normal. Negative for fracture or focal lesion. Sinuses/Orbits: Paranasal sinuses and mastoid air cells are clear. Orbits are clear. A polypoid mucosal lesion the LEFT ethmoid sinus not changed from  comparison MRI. Consistent with mucous retention cyst or polyp. Other: None. IMPRESSION: 1. No acute intracranial findings. 2. Atrophy and white matter microvascular disease. Electronically Signed   By: Suzy Bouchard M.D.   On: 03/25/2019 04:54    Pelvis Portable  Result Date: 03/26/2019 CLINICAL DATA:  Status post left hip replacement. EXAM: PORTABLE PELVIS 1-2 VIEWS COMPARISON:  None. FINDINGS: Left hip prosthesis in satisfactory position and alignment. No fracture or dislocation seen. The bones are diffusely osteopenic. IMPRESSION: Satisfactory appearance of a left hip prosthesis. Electronically Signed   By: Claudie Revering M.D.   On: 03/26/2019 13:35   DG Chest Port 1 View  Result Date: 03/25/2019 CLINICAL DATA:  Preoperative chest radiograph EXAM: PORTABLE CHEST 1 VIEW COMPARISON:  None. FINDINGS: Moderate cardiomegaly. Lungs are mildly hyperinflated. Pulmonary arteries are prominent. No sizable pleural effusion. No focal airspace consolidation or pulmonary edema. IMPRESSION: 1. No active cardiopulmonary disease. 2. Moderate cardiomegaly. Electronically Signed   By: Ulyses Jarred M.D.   On: 03/25/2019 05:40   DG Knee Complete 4 Views Left  Result Date: 03/25/2019 CLINICAL DATA:  Left hip pain.  Fall. EXAM: LEFT KNEE - COMPLETE 4+ VIEW COMPARISON:  None. FINDINGS: No evidence of fracture, dislocation, or joint effusion. No evidence of arthropathy or other focal bone abnormality. Soft tissues are unremarkable. IMPRESSION: Negative. Electronically Signed   By: Ulyses Jarred M.D.   On: 03/25/2019 05:09   DG Hip Unilat W or Wo Pelvis 2-3 Views Left  Result Date: 03/25/2019 CLINICAL DATA:  Fall, hip pain EXAM: DG HIP (WITH OR WITHOUT PELVIS) 2-3V LEFT COMPARISON:  None. FINDINGS: Subcapital fracture of the LEFT femoral neck with varus angulation. No dislocation. There is some proximal migration of the distal fracture segment. IMPRESSION: Subcapital fracture of the LEFT femoral neck. Electronically Signed   By: Suzy Bouchard M.D.   On: 03/25/2019 04:59   XR HIP UNILAT W OR W/O PELVIS 2-3 VIEWS LEFT  Result Date: 04/10/2019 AP frog-leg x-rays left hip demonstrates bipolar hemiarthroplasty in satisfactory position alignment leg lengths  are equal by radiographs. Impression: Satisfactory left hip hemiarthroplasty.   Assessment/Plan  #1 continue left lower extremity leg edema-this appears relatively unchanged from earlier in the week-Doppler ultrasound was reassuring-saying there was no DVT-at this point will monitor continue leg elevation-he will have follow-up with Dr. Sheppard Coil tomorrow. This could have a element of typical postop edema after surgery on his left hip.  2.  History of borderline hypernatremia this appears stable with sodium of 145.  Which is considered high normal  Apparently he is eating and drinking pretty well this will have to be encouraged.  I note his hemoglobin also was stable at 11.9-.  [place], PA-C [phone]

## 2019-04-20 ENCOUNTER — Encounter: Payer: Self-pay | Admitting: Internal Medicine

## 2019-04-20 ENCOUNTER — Non-Acute Institutional Stay (SKILLED_NURSING_FACILITY): Payer: Medicare Other | Admitting: Internal Medicine

## 2019-04-20 DIAGNOSIS — Z96649 Presence of unspecified artificial hip joint: Secondary | ICD-10-CM | POA: Diagnosis not present

## 2019-04-20 DIAGNOSIS — S72002D Fracture of unspecified part of neck of left femur, subsequent encounter for closed fracture with routine healing: Secondary | ICD-10-CM | POA: Diagnosis not present

## 2019-04-20 DIAGNOSIS — I10 Essential (primary) hypertension: Secondary | ICD-10-CM

## 2019-04-20 DIAGNOSIS — K56609 Unspecified intestinal obstruction, unspecified as to partial versus complete obstruction: Secondary | ICD-10-CM

## 2019-04-20 DIAGNOSIS — E785 Hyperlipidemia, unspecified: Secondary | ICD-10-CM

## 2019-04-20 DIAGNOSIS — I482 Chronic atrial fibrillation, unspecified: Secondary | ICD-10-CM

## 2019-04-20 DIAGNOSIS — R32 Unspecified urinary incontinence: Secondary | ICD-10-CM | POA: Diagnosis not present

## 2019-04-20 DIAGNOSIS — Z760 Encounter for issue of repeat prescription: Secondary | ICD-10-CM | POA: Diagnosis not present

## 2019-04-20 NOTE — Progress Notes (Addendum)
Location:  [place] Room Number: 101-P Place of Service:  SNF (31)  PCP: [name], NP Patient Care Team: [name], NP as PCP - General (Family Medicine) Nahser, [name], MD as PCP - Cardiology (Cardiology) Bjorn Loser, MD as Consulting Physician (Urology) [facility] as Physical Therapist (Physical Therapy) Tobi Bastos, RN as [place] [facility]  Extended Emergency Contact Information Primary Emergency Contact: wynn,Rocsi Hazelbaker Mobile Phone: [phone] Relation: Other Secondary Emergency Contact: Tomie China, [place] [address] Johnnette Litter of [employer] Phone: [phone] Relation: Other  No Known Allergies  Chief Complaint  Patient presents with  . Discharge Note    Patient is seen for discharge from SNF    HPI:  86 y.o. male  with atrial fibrillation on anticoagulation, hypertension, CAD, hyperlipidemia, who was admitted to [facility] from 8/13-18 with a left hip fracture after mechanical fall.  He underwent a's hemiarthroplasty per Dr. Lorin Mercy.  Patient was admitted to skilled nursing facility for OT/PT and is now ready to be discharged to home.      Past Medical History:  Diagnosis Date  . Arthritis   . Carotid artery stenosis 11/2008   bilateral 40-59% stenosis  . Colon polyps   . Diverticulosis   . Hepatitis    doesn't know which type 1969  . Hyperlipidemia   . Hypertension   . Numbness    fingertips  . Phimosis   . Prostate cancer ([place]) 2001  . Urothelial carcinoma of left distal ureter [facility])     Past Surgical History:  Procedure Laterality Date  . BIOPSY  10/01/2011   Procedure: BIOPSY;  Surgeon: [name], MD;  [facility];  Service: Urology;;  biopsy of bladder tumor  . BLADDER SUSPENSION  08/18/10   [name]  . CARDIAC CATHETERIZATION N/A 07/29/2015   Procedure: Right/Left Heart Cath and Coronary Angiography;   Surgeon: Peter M Martinique, MD;  Location: [place] CV LAB;  Service: Cardiovascular;  Laterality: N/A;  . CATARACT EXTRACTION W/ INTRAOCULAR LENS  IMPLANT, BILATERAL Bilateral   . CYSTOSCOPY W/ RETROGRADES  01/27/2012   Procedure: CYSTOSCOPY WITH RETROGRADE PYELOGRAM;  Surgeon: [name], MD;  [facility];  Service: Urology;  Laterality: Left;  . CYSTOSCOPY WITH RETROGRADE PYELOGRAM, URETEROSCOPY AND STENT PLACEMENT Left 11/24/2012   Procedure: CYSTOSCOPY WITH left RETROGRADE PYELOGRAM, bladder washings, ureteroscopy;  Surgeon: [name], MD;  [facility];  Service: Urology;  Laterality: Left;  . CYSTOSCOPY WITH RETROGRADE PYELOGRAM, URETEROSCOPY AND STENT PLACEMENT Bilateral 11/19/2013   Procedure: CYSTOSCOPY WITH BILATERAL RETROGRADE PYELOGRAM,;  Surgeon: [name], MD;  [facility];  Service: Urology;  Laterality: Bilateral;  . EYE SURGERY  09/24/10   left eye. "Retina surgery"  . HIP ARTHROPLASTY Left 03/26/2019   Procedure: ARTHROPLASTY BIPOLAR HIP (HEMIARTHROPLASTY);  Surgeon: Marybelle Killings, MD;  [facility];  Service: Orthopedics;  Laterality: Left;  Depuy press fit, lateral position  . INGUINAL HERNIA REPAIR  1960  . PROSTATE SURGERY     s/p laparoscopic prostate surgery  . SKIN BIOPSY  05/20/2017   Superficial and nodular basal cell carcinoma (left temple)    basal cell carcinoma with sclerosis (right temple)  . 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 SKIN CANCER EXCISION     a/p skin cancer right foot-non melanoma  . skin laceration    .  TEE WITHOUT CARDIOVERSION N/A 07/29/2015   Procedure: TRANSESOPHAGEAL ECHOCARDIOGRAM (TEE);  Surgeon: Pixie Casino, MD;  Location: [facility] ENDOSCOPY;  Service: Cardiovascular;  Laterality: N/A;  cath to follow  . TRANSURETHRAL RESECTION OF BLADDER TUMOR  10/01/2011   Procedure: TRANSURETHRAL RESECTION OF BLADDER TUMOR (TURBT);  Surgeon: [name], MD;  [facility];   Service: Urology;  Laterality: N/A;  . TRANSURETHRAL RESECTION OF BLADDER TUMOR N/A 11/19/2013   Procedure: TRANSURETHRAL RESECTION OF BLADDER TUMOR (TURBT);  Surgeon: [name], MD;  [facility];  Service: Urology;  Laterality: N/A;  . URETEROSCOPY  01/27/2012   Procedure: URETEROSCOPY;  Surgeon: [name], MD;  [facility];  Service: Urology;  Laterality: Left;  CYSTO, Left RETROGRADE PYELOGRPHY, LEFT URETEROSCOPY   . URINARY SPHINCTER IMPLANT    . VARICOSE VEIN SURGERY       reports that he has never smoked. He has never used smokeless tobacco. He reports current alcohol use of about 14.0 standard drinks of alcohol per week. He reports that he does not use drugs. Social History   Socioeconomic History  . Marital status: Single    Spouse name: Not on file  . Number of children: 0  . Years of education: Not on file  . Highest education level: Not on file  Occupational History  . Occupation: Retired  Tobacco Use  . Smoking status: Never Smoker  . Smokeless tobacco: Never Used  . Tobacco comment: 07/29/2015 "might smoke a cigar once/year"  Substance and Sexual Activity  . Alcohol use: Yes    Alcohol/week: 14.0 standard drinks    Types: 14 Glasses of wine per week    Comment: 07/29/2015 "big glass of wine q night"  . Drug use: No  . Sexual activity: Not on file  Other Topics Concern  . Not on file  Social History Narrative   Patient is single, has never been married.  Does not have any children.  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   He drinks approximately 3-4 glasses of wine per week.  No history of excessive alcohol use.  Occasionally smokes a cigar.   Spends winters in [place]        Social Determinants of Health   Financial Resource Strain:   . Difficulty of Paying Living Expenses: Not on file  Food Insecurity:   . Worried About Charity fundraiser in the Last Year: Not on file  . Ran Out of Food in the Last Year: Not on file    Transportation Needs:   . Lack of Transportation (Medical): Not on file  . Lack of Transportation (Non-Medical): Not on file  Physical Activity:   . Days of Exercise per Week: Not on file  . Minutes of Exercise per Session: Not on file  Stress:   . Feeling of Stress : Not on file  Social Connections:   . Frequency of Communication with Friends and Family: Not on file  . Frequency of Social Gatherings with Friends and Family: Not on file  . Attends Religious Services: Not on file  . Active Member of Clubs or Organizations: Not on file  . Attends Archivist Meetings: Not on file  . Marital Status: Not on file  Intimate Partner Violence:   . Fear of Current or Ex-Partner: Not on file  . Emotionally Abused: Not on file  . Physically Abused: Not on file  . Sexually Abused: Not on file    Pertinent  Health Maintenance Due  Topic Date Due  . INFLUENZA VACCINE  Completed  . PNA vac Low Risk Adult  Completed    Medications: Allergies as of 04/20/2019   No Known Allergies     Medication List       Accurate as of April 20, 2019 11:59 PM. If you have any questions, ask your nurse or doctor.        STOP taking these medications   bisacodyl 10 MG suppository Commonly known as: DULCOLAX Stopped by: Inocencio Homes, MD   magnesium hydroxide 400 MG/5ML suspension Commonly known as: MILK OF MAGNESIA Stopped by: Inocencio Homes, MD   RA SALINE ENEMA RE Stopped by: Inocencio Homes, MD     TAKE these medications   acetaminophen 325 MG tablet Commonly known as: TYLENOL Take 1-2 tablets (325-650 mg total) by mouth every 6 (six) hours as needed for mild pain or moderate pain (pain score 1-3 or temp > 100.5).   apixaban 2.5 MG Tabs tablet Commonly known as: Eliquis Take 1 tablet (2.5 mg total) by mouth 2 (two) times daily. What changed: how much to take Changed by: Inocencio Homes, MD   B-complex with vitamin C tablet Take 1 tablet by mouth daily. Reported on  07/24/2015   cholecalciferol 1000 units tablet Commonly known as: VITAMIN D Take 1 tablet (1,000 Units total) by mouth daily.   docusate sodium 100 MG capsule Commonly known as: COLACE Take 1 capsule (100 mg total) by mouth 2 (two) times daily.   donepezil 5 MG tablet Commonly known as: ARICEPT One tablet daily What changed: additional instructions Changed by: Inocencio Homes, MD   fluorometholone 0.1 % ophthalmic suspension Commonly known as: FML Place 1 drop into both eyes 4 (four) times daily.   fluticasone 50 MCG/ACT nasal spray Commonly known as: FLONASE Place 2 sprays into both nostrils at bedtime.   ipratropium 0.06 % nasal spray Commonly known as: ATROVENT Place 2 sprays into the nose 2 (two) times daily as needed. Nasal drainage   metoprolol succinate 25 MG 24 hr tablet Commonly known as: TOPROL-XL TAKE 1 TABLET BY MOUTH EVERYDAY AT BEDTIME   mirabegron ER 50 MG Tb24 tablet Commonly known as: Myrbetriq Take 1 tablet (50 mg total) by mouth daily.   Nutritional Supplement Liqd Take 120 mLs by mouth daily. MedPass   phosphorus 155-852-130 MG tablet Commonly known as: K PHOS NEUTRAL Take 2 tablets (500 mg total) by mouth 2 (two) times daily.   polyethylene glycol 17 g packet Commonly known as: MIRALAX / GLYCOLAX Take 17 g by mouth daily as needed for mild constipation.   potassium chloride 10 MEQ CR capsule Commonly known as: MICRO-K Take 1 capsule (10 mEq total) by mouth daily.   pravastatin 20 MG tablet Commonly known as: PRAVACHOL TAKE 1 TABLET BY MOUTH EVERYDAY AT BEDTIME   torsemide 20 MG tablet Commonly known as: DEMADEX Take 1 tablet (20 mg total) by mouth daily.   Xiidra 5 % Soln Generic drug: Lifitegrast Place 1 drop into both eyes 2 (two) times daily. What changed: when to take this Changed by: Inocencio Homes, MD        Vitals:   04/20/19 1530  BP: 126/67  Pulse: 76  Resp: 19  Temp: (!) 97.2 F (36.2 C)  TempSrc: Oral  Weight:  126 lb 1.6 oz (57.2 kg)  Height: 5\' 9"  (1.753 m)   Body mass index is 18.62 kg/m.  Physical Exam  GENERAL APPEARANCE: Alert, conversant. No acute distress.  HEENT: Unremarkable. RESPIRATORY:  Breathing is even, unlabored. Lung sounds are clear   CARDIOVASCULAR: Heart RRR no murmurs, rubs or gallops. No peripheral edema.  GASTROINTESTINAL: Abdomen is soft, non-tender, not distended w/ normal bowel sounds.  NEUROLOGIC: Cranial nerves 2-12 grossly intact. Moves all extremities   Labs reviewed: Basic Metabolic Panel: Recent Labs    03/25/19 0601 03/27/19 0459 03/28/19 0500 04/02/19 0000 04/09/19 0000 04/18/19 0000  NA 141 136 137 145 145 145  K 4.6 4.1 4.6 3.7 3.5 4.2  CL 96* 99 102 107 105 105  CO2 34* 29 28 28* 32* 30*  GLUCOSE 118* 112* 117*  --   --   --   BUN 39* 27* 28* 18 16 16   CREATININE 1.15 0.99 0.98 0.9 0.9 1.1  CALCIUM 9.1 8.8* 9.1 8.8 8.9 9.4  MG  --   --  2.1  --   --   --   PHOS  --   --  1.7*  --   --   --    No results found for: [facility] Liver Function Tests: Recent Labs    08/31/18 1247 02/21/19 1100  AST 23 31  ALT 14 17  ALKPHOS 53 79  BILITOT 0.9 1.6*  PROT 6.3 7.0  ALBUMIN 3.9 4.0   Recent Labs    02/21/19 1100  LIPASE 26   No results for input(s): AMMONIA in the last 8760 hours. CBC: Recent Labs    03/26/19 0449 03/27/19 0459 03/28/19 0500 04/02/19 0000 04/09/19 0000 04/18/19 0000  WBC 12.7* 15.2* 14.4* 9.5 6.4 6.2  NEUTROABS  --   --   --  8 5 5   HGB 13.5 12.3* 11.9* 11.4* 10.8* 11.9*  HCT 43.3 38.8* 37.3* 33* 31* 36*  MCV 103.6* 101.6* 99.5  --   --   --   PLT 217 186 182 225 300 270   Lipid No results for input(s): CHOL, HDL, LDLCALC, TRIG in the last 8760 hours. Cardiac Enzymes: No results for input(s): CKTOTAL, CKMB, CKMBINDEX, TROPONINI in the last 8760 hours. BNP: No results for input(s): BNP in the last 8760 hours. CBG: Recent Labs    02/21/19 2243  GLUCAP 99    Procedures and Imaging Studies During  Stay: DG Lumbar Spine Complete  Result Date: 03/25/2019 CLINICAL DATA:  Fall, hip pain EXAM: LUMBAR SPINE - COMPLETE 4+ VIEW COMPARISON:  None. FINDINGS: Normal alignment of the vertebral bodies. There is severe joint space narrowing from L2-S1. No loss vertebral body height. Endplate osteophytosis. IMPRESSION: No acute findings lumbar spine. Multilevel disc osteophytic disease and joint space narrowing. Electronically Signed   By: Suzy Bouchard M.D.   On: 03/25/2019 05:01   CT Head Wo Contrast  Result Date: 03/25/2019 CLINICAL DATA:  Minor trauma EXAM: CT HEAD WITHOUT CONTRAST TECHNIQUE: Contiguous axial images were obtained from the base of the skull through the vertex without intravenous contrast. COMPARISON:  None. FINDINGS: Brain: No acute intracranial hemorrhage. No focal mass lesion. No CT evidence of acute infarction. No midline shift or mass effect. No hydrocephalus. Basilar cisterns are patent. There are periventricular and subcortical white matter hypodensities. Generalized cortical atrophy. Vascular: No hyperdense vessel or unexpected calcification. Skull: Normal. Negative for fracture or focal lesion. Sinuses/Orbits: Paranasal sinuses and mastoid air cells are clear. Orbits are clear. A polypoid mucosal lesion the LEFT ethmoid sinus not changed from comparison MRI. Consistent with mucous retention cyst or polyp. Other: None. IMPRESSION: 1. No acute intracranial findings. 2. Atrophy and white matter microvascular disease. Electronically Signed  By: Suzy Bouchard M.D.   On: 03/25/2019 04:54   Pelvis Portable  Result Date: 03/26/2019 CLINICAL DATA:  Status post left hip replacement. EXAM: PORTABLE PELVIS 1-2 VIEWS COMPARISON:  None. FINDINGS: Left hip prosthesis in satisfactory position and alignment. No fracture or dislocation seen. The bones are diffusely osteopenic. IMPRESSION: Satisfactory appearance of a left hip prosthesis. Electronically Signed   By: Claudie Revering M.D.   On:  03/26/2019 13:35   DG Chest Port 1 View  Result Date: 03/25/2019 CLINICAL DATA:  Preoperative chest radiograph EXAM: PORTABLE CHEST 1 VIEW COMPARISON:  None. FINDINGS: Moderate cardiomegaly. Lungs are mildly hyperinflated. Pulmonary arteries are prominent. No sizable pleural effusion. No focal airspace consolidation or pulmonary edema. IMPRESSION: 1. No active cardiopulmonary disease. 2. Moderate cardiomegaly. Electronically Signed   By: Ulyses Jarred M.D.   On: 03/25/2019 05:40   DG Knee Complete 4 Views Left  Result Date: 03/25/2019 CLINICAL DATA:  Left hip pain.  Fall. EXAM: LEFT KNEE - COMPLETE 4+ VIEW COMPARISON:  None. FINDINGS: No evidence of fracture, dislocation, or joint effusion. No evidence of arthropathy or other focal bone abnormality. Soft tissues are unremarkable. IMPRESSION: Negative. Electronically Signed   By: Ulyses Jarred M.D.   On: 03/25/2019 05:09   DG Hip Unilat W or Wo Pelvis 2-3 Views Left  Result Date: 03/25/2019 CLINICAL DATA:  Fall, hip pain EXAM: DG HIP (WITH OR WITHOUT PELVIS) 2-3V LEFT COMPARISON:  None. FINDINGS: Subcapital fracture of the LEFT femoral neck with varus angulation. No dislocation. There is some proximal migration of the distal fracture segment. IMPRESSION: Subcapital fracture of the LEFT femoral neck. Electronically Signed   By: Suzy Bouchard M.D.   On: 03/25/2019 04:59   XR HIP UNILAT W OR W/O PELVIS 2-3 VIEWS LEFT  Result Date: 04/10/2019 AP frog-leg x-rays left hip demonstrates bipolar hemiarthroplasty in satisfactory position alignment leg lengths are equal by radiographs. Impression: Satisfactory left hip hemiarthroplasty.   Assessment/Plan:   S/P hip hemiarthroplasty  Medication refill - Plan: apixaban (ELIQUIS) 2.5 MG TABS tablet, metoprolol succinate (TOPROL-XL) 25 MG 24 hr tablet  Chronic atrial fibrillation (HCC) - Plan: apixaban (ELIQUIS) 2.5 MG TABS tablet, metoprolol succinate (TOPROL-XL) 25 MG 24 hr tablet  Essential  hypertension - Plan: metoprolol succinate (TOPROL-XL) 25 MG 24 hr tablet  Urinary incontinence, unspecified type - Plan: mirabegron ER (MYRBETRIQ) 50 MG TB24 tablet  Closed fracture of left hip with routine healing, subsequent encounter  Hyperlipidemia, unspecified hyperlipidemia type  Small bowel obstruction ([place])   Patient is being discharged with the following home health services: OT/PT  Patient is being discharged with the following durable medical equipment: Rolling walker, bedside commode  Patient has been advised to f/u with their PCP in 1-2 weeks to bring them up to date on their rehab stay.  Social services at facility was responsible for arranging this appointment.  Pt was provided with a 30 day supply of prescriptions for medications and refills must be obtained from their PCP.  For controlled substances, a more limited supply may be provided adequate until PCP appointment only.   Time spent greater than 35 minutes;> 50% of time with patient was spent reviewing records, labs, tests and studies, counseling and developing plan of care  Hennie Duos MD

## 2019-04-21 ENCOUNTER — Other Ambulatory Visit: Payer: Self-pay | Admitting: Adult Health

## 2019-04-21 ENCOUNTER — Other Ambulatory Visit: Payer: Self-pay | Admitting: Internal Medicine

## 2019-04-21 ENCOUNTER — Encounter: Payer: Self-pay | Admitting: Internal Medicine

## 2019-04-21 DIAGNOSIS — S72009A Fracture of unspecified part of neck of unspecified femur, initial encounter for closed fracture: Secondary | ICD-10-CM | POA: Insufficient documentation

## 2019-04-21 DIAGNOSIS — I482 Chronic atrial fibrillation, unspecified: Secondary | ICD-10-CM

## 2019-04-21 DIAGNOSIS — I10 Essential (primary) hypertension: Secondary | ICD-10-CM

## 2019-04-21 DIAGNOSIS — Z760 Encounter for issue of repeat prescription: Secondary | ICD-10-CM

## 2019-04-21 HISTORY — DX: Fracture of unspecified part of neck of unspecified femur, initial encounter for closed fracture: S72.009A

## 2019-04-21 MED ORDER — XIIDRA 5 % OP SOLN: 1.0000 [drp] | Freq: Two times a day (BID) | OPHTHALMIC | 0 refills | Status: DC

## 2019-04-21 MED ORDER — FLUTICASONE PROPIONATE 50 MCG/ACT NA SUSP: 2.0000 | Freq: Every day | NASAL | 0 refills | Status: DC

## 2019-04-21 MED ORDER — APIXABAN 2.5 MG PO TABS: 2.5000 mg | ORAL_TABLET | Freq: Two times a day (BID) | ORAL | 0 refills | Status: DC

## 2019-04-21 MED ORDER — FLUOROMETHOLONE 0.1 % OP SUSP: 1.0000 [drp] | Freq: Four times a day (QID) | OPHTHALMIC | 0 refills | Status: DC

## 2019-04-21 MED ORDER — MIRABEGRON ER 50 MG PO TB24: 50.0000 mg | ORAL_TABLET | Freq: Every day | ORAL | 0 refills | Status: DC

## 2019-04-21 MED ORDER — PRAVASTATIN SODIUM 20 MG PO TABS: ORAL_TABLET | ORAL | 0 refills | Status: DC

## 2019-04-21 MED ORDER — DONEPEZIL HCL 5 MG PO TABS: ORAL_TABLET | ORAL | 0 refills | Status: DC

## 2019-04-21 MED ORDER — K PHOS MONO-SOD PHOS DI & MONO 155-852-130 MG PO TABS: 500.0000 mg | ORAL_TABLET | Freq: Two times a day (BID) | ORAL | 0 refills | Status: DC

## 2019-04-21 MED ORDER — IPRATROPIUM BROMIDE 0.06 % NA SOLN: 2.0000 | Freq: Two times a day (BID) | NASAL | 0 refills | Status: DC | PRN

## 2019-04-21 MED ORDER — POTASSIUM CHLORIDE ER 10 MEQ PO CPCR: 10.0000 meq | ORAL_CAPSULE | Freq: Every day | ORAL | 0 refills | Status: AC

## 2019-04-21 MED ORDER — TORSEMIDE 20 MG PO TABS: 20.0000 mg | ORAL_TABLET | Freq: Every day | ORAL | 0 refills | Status: AC

## 2019-04-21 MED ORDER — METOPROLOL SUCCINATE ER 25 MG PO TB24: ORAL_TABLET | ORAL | 0 refills | Status: DC

## 2019-04-22 ENCOUNTER — Encounter: Payer: Self-pay | Admitting: Internal Medicine

## 2019-04-25 ENCOUNTER — Ambulatory Visit (INDEPENDENT_AMBULATORY_CARE_PROVIDER_SITE_OTHER): Payer: Medicare Other | Admitting: Orthopaedic Surgery

## 2019-04-25 ENCOUNTER — Encounter: Payer: Self-pay | Admitting: Orthopaedic Surgery

## 2019-04-25 ENCOUNTER — Other Ambulatory Visit: Payer: Self-pay

## 2019-04-25 VITALS — Ht 69.0 in | Wt 126.0 lb

## 2019-04-25 DIAGNOSIS — Z96642 Presence of left artificial hip joint: Secondary | ICD-10-CM

## 2019-04-25 DIAGNOSIS — S72002D Fracture of unspecified part of neck of left femur, subsequent encounter for closed fracture with routine healing: Secondary | ICD-10-CM

## 2019-04-25 HISTORY — DX: Presence of left artificial hip joint: Z96.642

## 2019-04-25 NOTE — Patient Outreach (Signed)
[facility]) Care Management  04/25/2019  CAIGE TIMMERMANN 1926-05-22 [id number]   Social work referral received today from [place], Delon [place], regarding meal delivery program post SNF discharge.  Referral requested that HCPOA, Bo Merino, be contacted to discuss program. Successful outreach to Mr. Alexander today.  Informed him about Mom's Meals program and that [facility] can cover cost for meal delivery for 30 days.  He inquired about patient receiving this long-term.  Informed [name] Coil that patient can continue to receive meals after 30 days but he would be responsible for cost at that time.  Discussed Meals on Wheels program and informed him that wait list is quite lengthy at this time.  [name] Coil stated that patient has someone staying with him for the next two weeks and that person can assist with meal preparation etc.  He does feel that patient could benefit from program once he is living alone again.  He requested that I contact patient next week to discuss program and submit referral if patient desires.    [name], BSW Social Worker [phone]

## 2019-04-25 NOTE — Progress Notes (Signed)
Post-Op Visit Note   Patient: Jeremy Kelley           Date of Birth: Oct 02, 1926           MRN: [id number] Visit Date: 04/25/2019 PCP: [name], NP   Assessment & Plan: Post left hip hemiarthroplasty.  Chief Complaint:  Chief Complaint  Patient presents with  . Left Leg - Pain    03/26/2019 Left Hip Hemiarthroplasty   Visit Diagnoses:  1. Closed fracture of left hip with routine healing, subsequent encounter   2. History of hemiarthroplasty of left hip   3.   Chronic venous stasis bilateral LE  Plan: Patient had recent Doppler negative for DVT still has swelling he is on Eliquis.  He has chronic brown discoloration from tibial tubercle down bilaterally.  We discussed the importance of using teds to prevent further skin tears, cellulitis and then likely having to be admitted.  He tried some home rehab states are really tight.  Will order some low weight pressure knee-high teds.  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  Follow-Up Instructions: Return if symptoms worsen or fail to improve.   Orders:  No orders of the defined types were placed in this encounter.  No orders of the defined types were placed in this encounter.   Imaging: No results found.  PMFS History: Patient Active Problem List   Diagnosis Date Noted  . History of hemiarthroplasty of left hip 04/25/2019  . Hip fracture ([place]) 04/21/2019  . Dementia ([place]) 04/08/2019  . S/P hip hemiarthroplasty   . Small bowel obstruction ([place]) 02/21/2019  . Closed pilon fracture, left, sequela 10/11/2016  . Pain in left ankle and joints of left foot 10/04/2016  . Idiopathic chronic venous hypertension of left lower extremity with ulcer and inflammation ([place]) 10/04/2016  . Fatigue 11/04/2015  . CAD (coronary artery disease) 08/01/2015  . Pulmonary hypertension ([place]) 07/24/2015  . Atrial fibrillation ([place]) 07/16/2015  . Aortic insufficiency 01/04/2014  . Squamous cell carcinoma in situ of skin  of right lower leg 11/13/2013  . Lentigo maligna of cheek ([place]) 09/19/2013  . Actinic keratosis 11/06/2012  . Macular degeneration 08/10/2012  . Allergic rhinitis 08/10/2012  . Bladder cancer ([place]) 10/19/2011  . Eczema 02/23/2011  . Hyperglycemia 02/11/2011  . Heart murmur 09/10/2010  . ABNORMAL THYROID FUNCTION TESTS 08/05/2009  . ABNORMAL HEART RHYTHMS 07/11/2009  . Hyperlipidemia 11/28/2008  . CAROTID BRUIT 11/19/2008  . UNSPECIFIED CATARACT 10/17/2008  . PARESTHESIA 12/04/2007  . Varicose veins of bilateral lower extremities with other complications [id number]  . COLONIC POLYPS, HX OF 08/28/2007  . Essential hypertension 02/08/2007  . PROSTATE CANCER, HX OF 11/04/2006  . INGUINAL HERNIA, HX OF 11/04/2006   Past Medical History:  Diagnosis Date  . Arthritis   . Carotid artery stenosis 11/2008   bilateral 40-59% stenosis  . Colon polyps   . Diverticulosis   . Hepatitis    doesn't know which type 1969  . Hyperlipidemia   . Hypertension   . Numbness    fingertips  . Phimosis   . Prostate cancer ([place]) 2001  . Urothelial carcinoma of left distal ureter (HCC)     Family History  Problem Relation Age of Onset  . Colon cancer Neg Hx   . Colon polyps Neg Hx     Past Surgical History:  Procedure Laterality Date  . BIOPSY  10/01/2011   Procedure: BIOPSY;  Surgeon: [name], MD;  [facility];  Service:  Urology;;  biopsy of bladder tumor  . BLADDER SUSPENSION  08/18/10   [name]  . CARDIAC CATHETERIZATION N/A 07/29/2015   Procedure: Right/Left Heart Cath and Coronary Angiography;  Surgeon: Peter M Martinique, MD;  Location: [place] CV LAB;  Service: Cardiovascular;  Laterality: N/A;  . CATARACT EXTRACTION W/ INTRAOCULAR LENS  IMPLANT, BILATERAL Bilateral   . CYSTOSCOPY W/ RETROGRADES  01/27/2012   Procedure: CYSTOSCOPY WITH RETROGRADE PYELOGRAM;  Surgeon: [name], MD;  [facility];  Service: Urology;  Laterality: Left;  . CYSTOSCOPY WITH RETROGRADE  PYELOGRAM, URETEROSCOPY AND STENT PLACEMENT Left 11/24/2012   Procedure: CYSTOSCOPY WITH left RETROGRADE PYELOGRAM, bladder washings, ureteroscopy;  Surgeon: [name], MD;  [facility];  Service: Urology;  Laterality: Left;  . CYSTOSCOPY WITH RETROGRADE PYELOGRAM, URETEROSCOPY AND STENT PLACEMENT Bilateral 11/19/2013   Procedure: CYSTOSCOPY WITH BILATERAL RETROGRADE PYELOGRAM,;  Surgeon: [name], MD;  [facility];  Service: Urology;  Laterality: Bilateral;  . EYE SURGERY  09/24/10   left eye. "Retina surgery"  . HIP ARTHROPLASTY Left 03/26/2019   Procedure: ARTHROPLASTY BIPOLAR HIP (HEMIARTHROPLASTY);  Surgeon: Marybelle Killings, MD;  [facility];  Service: Orthopedics;  Laterality: Left;  Depuy press fit, lateral position  . INGUINAL HERNIA REPAIR  1960  . PROSTATE SURGERY     s/p laparoscopic prostate surgery  . SKIN BIOPSY  05/20/2017   Superficial and nodular basal cell carcinoma (left temple)    basal cell carcinoma with sclerosis (right temple)  . 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 SKIN CANCER EXCISION     a/p skin cancer right foot-non melanoma  . skin laceration    . TEE WITHOUT CARDIOVERSION N/A 07/29/2015   Procedure: TRANSESOPHAGEAL ECHOCARDIOGRAM (TEE);  Surgeon: Pixie Casino, MD;  Location: [facility] ENDOSCOPY;  Service: Cardiovascular;  Laterality: N/A;  cath to follow  . TRANSURETHRAL RESECTION OF BLADDER TUMOR  10/01/2011   Procedure: TRANSURETHRAL RESECTION OF BLADDER TUMOR (TURBT);  Surgeon: [name], MD;  [facility];  Service: Urology;  Laterality: N/A;  . TRANSURETHRAL RESECTION OF BLADDER TUMOR N/A 11/19/2013   Procedure: TRANSURETHRAL RESECTION OF BLADDER TUMOR (TURBT);  Surgeon: [name], MD;  [facility];  Service: Urology;  Laterality: N/A;  . URETEROSCOPY  01/27/2012   Procedure: URETEROSCOPY;  Surgeon: [name], MD;  [facility];  Service: Urology;  Laterality: Left;   CYSTO, Left RETROGRADE PYELOGRPHY, LEFT URETEROSCOPY   . URINARY SPHINCTER IMPLANT    . VARICOSE VEIN SURGERY     Social History   Occupational History  . Occupation: Retired  Tobacco Use  . Smoking status: Never Smoker  . Smokeless tobacco: Never Used  . Tobacco comment: 07/29/2015 "might smoke a cigar once/year"  Substance and Sexual Activity  . Alcohol use: Yes    Alcohol/week: 14.0 standard drinks    Types: 14 Glasses of wine per week    Comment: 07/29/2015 "big glass of wine q night"  . Drug use: No  . Sexual activity: Not on file

## 2019-04-26 ENCOUNTER — Ambulatory Visit (INDEPENDENT_AMBULATORY_CARE_PROVIDER_SITE_OTHER): Payer: Medicare Other | Admitting: Adult Health

## 2019-04-26 ENCOUNTER — Encounter: Payer: Self-pay | Admitting: Adult Health

## 2019-04-26 ENCOUNTER — Inpatient Hospital Stay: Payer: Medicare Other | Admitting: Adult Health

## 2019-04-26 VITALS — BP 110/70 | HR 82 | Temp 97.4°F | Ht 69.0 in | Wt 125.0 lb

## 2019-04-26 DIAGNOSIS — R2681 Unsteadiness on feet: Secondary | ICD-10-CM | POA: Diagnosis not present

## 2019-04-26 DIAGNOSIS — I25119 Atherosclerotic heart disease of native coronary artery with unspecified angina pectoris: Secondary | ICD-10-CM

## 2019-04-26 DIAGNOSIS — Z96649 Presence of unspecified artificial hip joint: Secondary | ICD-10-CM | POA: Diagnosis not present

## 2019-04-26 DIAGNOSIS — Z96642 Presence of left artificial hip joint: Secondary | ICD-10-CM | POA: Diagnosis not present

## 2019-04-26 DIAGNOSIS — I872 Venous insufficiency (chronic) (peripheral): Secondary | ICD-10-CM | POA: Diagnosis not present

## 2019-04-26 DIAGNOSIS — I482 Chronic atrial fibrillation, unspecified: Secondary | ICD-10-CM | POA: Diagnosis not present

## 2019-04-26 DIAGNOSIS — I272 Pulmonary hypertension, unspecified: Secondary | ICD-10-CM | POA: Diagnosis not present

## 2019-04-26 LAB — CBC WITH DIFFERENTIAL/PLATELET
Basophils Absolute: 0.1 10*3/uL (ref 0.0–0.1)
Basophils Relative: 0.7 % (ref 0.0–3.0)
Eosinophils Absolute: 0.1 10*3/uL (ref 0.0–0.7)
Eosinophils Relative: 0.6 % (ref 0.0–5.0)
HCT: 39.8 % (ref 39.0–52.0)
Hemoglobin: 13.1 g/dL (ref 13.0–17.0)
Lymphocytes Relative: 9.8 % — ABNORMAL LOW (ref 12.0–46.0)
Lymphs Abs: 1 10*3/uL (ref 0.7–4.0)
MCHC: 33 g/dL (ref 30.0–36.0)
MCV: 96.1 fl (ref 78.0–100.0)
Monocytes Absolute: 0.5 10*3/uL (ref 0.1–1.0)
Monocytes Relative: 4.7 % (ref 3.0–12.0)
Neutro Abs: 8.7 10*3/uL — ABNORMAL HIGH (ref 1.4–7.7)
Neutrophils Relative %: 84.2 % — ABNORMAL HIGH (ref 43.0–77.0)
Platelets: 243 10*3/uL (ref 150.0–400.0)
RBC: 4.14 Mil/uL — ABNORMAL LOW (ref 4.22–5.81)
RDW: 14.2 % (ref 11.5–15.5)
WBC: 10.3 10*3/uL (ref 4.0–10.5)

## 2019-04-26 LAB — BASIC METABOLIC PANEL
BUN: 18 mg/dL (ref 6–23)
CO2: 32 mEq/L (ref 19–32)
Calcium: 9.8 mg/dL (ref 8.4–10.5)
Chloride: 104 mEq/L (ref 96–112)
Creatinine, Ser: 1 mg/dL (ref 0.40–1.50)
GFR: 70.16 mL/min (ref >60.00)
Glucose, Bld: 94 mg/dL (ref 70–99)
Potassium: 4.5 mEq/L (ref 3.5–5.1)
Sodium: 143 mEq/L (ref 135–145)

## 2019-04-26 NOTE — Progress Notes (Addendum)
Subjective:    Patient ID: Jeremy Kelley, male    DOB: 02/25/1927, 86 y.o.   MRN: [id number]  HPI 86 year old male who  has a past medical history of Arthritis, Carotid artery stenosis (11/2008), Colon polyps, Diverticulosis, Hepatitis, Hyperlipidemia, Hypertension, Numbness, Phimosis, Prostate cancer ([place]) (2001), and Urothelial carcinoma of left distal ureter ([place]).  TCM Visit   Admit Date: 03/25/2019 Discharge Date 12/18/202 Discharge from [facility] 04/24/2019  Was admitted with left hip fracture after mechanical fall.  He underwent hemiarthroplasty by Dr. Lorin Mercy.  His postoperative course was uncomplicated and Eliquis was restarted with no signs of bleeding.  Torsemide was held as he appeared euvolemic and started at half of his home dose prior to discharge.  He was evaluated by PT/OT who recommended skilled nursing facility for rehab and he completed this at [place].  He had a follow-up appointment with Dr. Lorin Mercy yesterday status post left hip hemiarthroplasty as well as sided lower extremity edema.  He did have a recent Doppler was negative for DVT and continues to be on Eliquis.  Dr. Lorin Mercy discussed the importance of Ted hose, and some low weight pressure knee-high teds were ordered for him.  Overall orthopedics very happy with his overall progress status post left hip hemiarthroplasty  Today he reports that he is doing well at home. He has a friend from [place] who came to stay with him for a few weeks and he has other friends from [place] checking in on him.  Has a bed in his downstairs den so he does not have to climb to the second floor where his bedroom is.  He has not had any falls since being home and is using his cane and walker when he is ambulating.  He denies any pain in his left hip s/p surgery.   Home health PT will start later on this afternoon.   Review of Systems  Constitutional: Positive for fatigue.  Respiratory: Negative.   Cardiovascular: Positive for leg  swelling (LLE).  Gastrointestinal: Negative.   Musculoskeletal: Positive for back pain (chronic ) and gait problem.  Hematological: Negative.   Psychiatric/Behavioral: Negative.   All other systems reviewed and are negative.  Past Medical History:  Diagnosis Date  . Arthritis   . Carotid artery stenosis 11/2008   bilateral 40-59% stenosis  . Colon polyps   . Diverticulosis   . Hepatitis    doesn't know which type 1969  . Hyperlipidemia   . Hypertension   . Numbness    fingertips  . Phimosis   . Prostate cancer ([place]) 2001  . Urothelial carcinoma of left distal ureter [facility])     Social History   Socioeconomic History  . Marital status: Single    Spouse name: Not on file  . Number of children: 0  . Years of education: Not on file  . Highest education level: Not on file  Occupational History  . Occupation: Retired  Tobacco Use  . Smoking status: Never Smoker  . Smokeless tobacco: Never Used  . Tobacco comment: 07/29/2015 "might smoke a cigar once/year"  Substance and Sexual Activity  . Alcohol use: Yes    Alcohol/week: 14.0 standard drinks    Types: 14 Glasses of wine per week    Comment: 07/29/2015 "big glass of wine q night"  . Drug use: No  . Sexual activity: Not on file  Other Topics Concern  . Not on file  Social History Narrative   Patient is single, has never  been married.  Does not have any children.  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   He drinks approximately 3-4 glasses of wine per week.  No history of excessive alcohol use.  Occasionally smokes a cigar.   Spends winters in [place]        Social Determinants of Health   Financial Resource Strain:   . Difficulty of Paying Living Expenses: Not on file  Food Insecurity:   . Worried About Charity fundraiser in the Last Year: Not on file  . Ran Out of Food in the Last Year: Not on file  Transportation Needs:   . Lack of Transportation (Medical): Not on file  . Lack of  Transportation (Non-Medical): Not on file  Physical Activity:   . Days of Exercise per Week: Not on file  . Minutes of Exercise per Session: Not on file  Stress:   . Feeling of Stress : Not on file  Social Connections:   . Frequency of Communication with Friends and Family: Not on file  . Frequency of Social Gatherings with Friends and Family: Not on file  . Attends Religious Services: Not on file  . Active Member of Clubs or Organizations: Not on file  . Attends Archivist Meetings: Not on file  . Marital Status: Not on file  Intimate Partner Violence:   . Fear of Current or Ex-Partner: Not on file  . Emotionally Abused: Not on file  . Physically Abused: Not on file  . Sexually Abused: Not on file    Past Surgical History:  Procedure Laterality Date  . BIOPSY  10/01/2011   Procedure: BIOPSY;  Surgeon: [name], MD;  Location: [facility];  Service: Urology;;  biopsy of bladder tumor  . BLADDER SUSPENSION  08/18/10   [name]  . CARDIAC CATHETERIZATION N/A 07/29/2015   Procedure: Right/Left Heart Cath and Coronary Angiography;  Surgeon: Peter M Martinique, MD;  Location: [place] [facility];  Service: Cardiovascular;  Laterality: N/A;  . CATARACT EXTRACTION W/ INTRAOCULAR LENS  IMPLANT, BILATERAL Bilateral   . CYSTOSCOPY W/ RETROGRADES  01/27/2012   Procedure: CYSTOSCOPY WITH RETROGRADE PYELOGRAM;  Surgeon: [name], MD;  Location: [facility];  Service: Urology;  Laterality: Left;  . CYSTOSCOPY WITH RETROGRADE PYELOGRAM, URETEROSCOPY AND STENT PLACEMENT Left 11/24/2012   Procedure: CYSTOSCOPY WITH left RETROGRADE PYELOGRAM, bladder washings, ureteroscopy;  Surgeon: [name], MD;  Location: [facility];  Service: Urology;  Laterality: Left;  . CYSTOSCOPY WITH RETROGRADE PYELOGRAM, URETEROSCOPY AND STENT PLACEMENT Bilateral 11/19/2013   Procedure: CYSTOSCOPY WITH BILATERAL RETROGRADE PYELOGRAM,;  Surgeon: [name], MD;  Location: [facility];  Service: Urology;  Laterality: Bilateral;  .  EYE SURGERY  09/24/10   left eye. "Retina surgery"  . HIP ARTHROPLASTY Left 03/26/2019   Procedure: ARTHROPLASTY BIPOLAR HIP (HEMIARTHROPLASTY);  Surgeon: Marybelle Killings, MD;  Location: [facility];  Service: Orthopedics;  Laterality: Left;  Depuy press fit, lateral position  . INGUINAL HERNIA REPAIR  1960  . PROSTATE SURGERY     s/p laparoscopic prostate surgery  . SKIN BIOPSY  05/20/2017   Superficial and nodular basal cell carcinoma (left temple)    basal cell carcinoma with sclerosis (right temple)  . 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 SKIN CANCER EXCISION     a/p skin cancer right foot-non melanoma  . skin laceration    . TEE WITHOUT CARDIOVERSION N/A 07/29/2015   Procedure:  TRANSESOPHAGEAL ECHOCARDIOGRAM (TEE);  Surgeon: Pixie Casino, MD;  Location: [facility];  Service: Cardiovascular;  Laterality: N/A;  cath to follow  . TRANSURETHRAL RESECTION OF BLADDER TUMOR  10/01/2011   Procedure: TRANSURETHRAL RESECTION OF BLADDER TUMOR (TURBT);  Surgeon: [name], MD;  Location: [facility];  Service: Urology;  Laterality: N/A;  . TRANSURETHRAL RESECTION OF BLADDER TUMOR N/A 11/19/2013   Procedure: TRANSURETHRAL RESECTION OF BLADDER TUMOR (TURBT);  Surgeon: [name], MD;  [facility];  Service: Urology;  Laterality: N/A;  . URETEROSCOPY  01/27/2012   Procedure: URETEROSCOPY;  Surgeon: [name], MD;  [facility];  Service: Urology;  Laterality: Left;  CYSTO, Left RETROGRADE PYELOGRPHY, LEFT URETEROSCOPY   . URINARY SPHINCTER IMPLANT    . VARICOSE VEIN SURGERY      Family History  Problem Relation Age of Onset  . Colon cancer Neg Hx   . Colon polyps Neg Hx     No Known Allergies  Current Outpatient Medications on File Prior to Visit  Medication Sig Dispense Refill  . acetaminophen (TYLENOL) 325 MG tablet Take 1-2 tablets (325-650 mg total) by mouth every 6 (six) hours as needed for mild pain or moderate pain (pain  score 1-3 or temp > 100.5). (Patient taking differently: Take 650 mg by mouth every 6 (six) hours as needed for mild pain or moderate pain (pain score 1-3 or temp > 100.5). ) 90 tablet 1  . apixaban (ELIQUIS) 2.5 MG TABS tablet Take 1 tablet (2.5 mg total) by mouth 2 (two) times daily. 60 tablet 0  . B Complex-C (B-COMPLEX WITH VITAMIN C) tablet Take 1 tablet by mouth daily. Reported on 07/24/2015 90 tablet 3  . cholecalciferol (VITAMIN D) 1000 units tablet Take 1 tablet (1,000 Units total) by mouth daily. 90 tablet 3  . docusate sodium (COLACE) 100 MG capsule Take 1 capsule (100 mg total) by mouth 2 (two) times daily. 10 capsule 0  . donepezil (ARICEPT) 5 MG tablet One tablet daily 30 tablet 0  . fluorometholone (FML) 0.1 % ophthalmic suspension Place 1 drop into both eyes 4 (four) times daily. 5 mL 0  . fluticasone (FLONASE) 50 MCG/ACT nasal spray Place 2 sprays into both nostrils at bedtime. 9.9 mL 0  . ipratropium (ATROVENT) 0.06 % nasal spray Place 2 sprays into the nose 2 (two) times daily as needed. Nasal drainage 15 mL 0  . metoprolol succinate (TOPROL-XL) 25 MG 24 hr tablet TAKE 1 TABLET BY MOUTH EVERYDAY AT BEDTIME 90 tablet 1  . mirabegron ER (MYRBETRIQ) 50 MG TB24 tablet Take 1 tablet (50 mg total) by mouth daily. 30 tablet 0  . Nutritional Supplement LIQD Take 120 mLs by mouth daily. [employer]    . phosphorus (K PHOS NEUTRAL) 155-852-130 MG tablet Take 2 tablets (500 mg total) by mouth 2 (two) times daily. 120 tablet 0  . polyethylene glycol (MIRALAX / GLYCOLAX) 17 g packet Take 17 g by mouth daily as needed for mild constipation.     . potassium chloride (MICRO-K) 10 MEQ CR capsule Take 1 capsule (10 mEq total) by mouth daily. 30 capsule 0  . pravastatin (PRAVACHOL) 20 MG tablet TAKE 1 TABLET BY MOUTH EVERYDAY AT BEDTIME 30 tablet 0  . torsemide (DEMADEX) 20 MG tablet Take 1 tablet (20 mg total) by mouth daily. 30 tablet 0  . XIIDRA 5 % SOLN Place 1 drop into both eyes 2 (two) times  daily. 5 each 0   No current facility-administered medications on  file prior to visit.    BP 110/70   Pulse 82   Temp (!) 97.4 F (36.3 C) (Other (Comment))   Ht 5\' 9"  (1.753 m)   Wt 125 lb (56.7 kg)   SpO2 (!) 85%   BMI 18.46 kg/m       Objective:   Physical Exam Vitals and nursing note reviewed.  Constitutional:      Appearance: Normal appearance.  Cardiovascular:     Rate and Rhythm: Rhythm irregularly irregular.     Pulses: Normal pulses.     Heart sounds: Normal heart sounds.  Pulmonary:     Effort: Pulmonary effort is normal.     Breath sounds: Normal breath sounds.  Musculoskeletal:     Left lower leg: Edema present.  Skin:    General: Skin is warm and dry.     Comments: Well healed surgical scar on left hip  Neurological:     General: No focal deficit present.     Mental Status: He is alert and oriented to person, place, and time.     Comments: Has slow gait with walker  Psychiatric:        Mood and Affect: Mood normal.        Behavior: Behavior normal.        Thought Content: Thought content normal.        Judgment: Judgment normal.       Assessment & Plan:  1. S/P hip hemiarthroplasty -We reviewed his hospital notes and discharge instructions.  All questions answered. -He does appear to be doing well.  Continue with home health PT. use walker when ambulating.  He can follow-up as needed. -Fall precautions reviewed - CBC with Differential/Platelet - Basic Metabolic Panel  2. Coronary artery disease involving native coronary artery of native heart with angina pectoris ([place]) - Continue with statin  - CBC with Differential/Platelet - Basic Metabolic Panel  3. Pulmonary hypertension (HCC) - Continue with Torsemide 20 mg. Appears Evolemic  - CBC with Differential/Platelet - Basic Metabolic Panel  4. Chronic atrial fibrillation (HCC) - Continue with Eliquis. Follow up with cardiology as directed - CBC with Differential/Platelet - Basic Metabolic  Panel  5. Venous insufficiency - Encouraged to wear compressions socks that orthopedics ordered for him yesterday  - CBC with Differential/Platelet - Basic Metabolic Panel   [name], NP

## 2019-04-27 ENCOUNTER — Other Ambulatory Visit: Payer: Self-pay | Admitting: *Deleted

## 2019-04-27 ENCOUNTER — Telehealth: Payer: Self-pay | Admitting: Adult Health

## 2019-04-27 NOTE — Telephone Encounter (Signed)
Is it okay to give verbal orders?  

## 2019-04-27 NOTE — Telephone Encounter (Signed)
Cecila called from [facility] requesting verbal orders for PT and OT 1x 1week 2x 3weeks 1x 5 weeks Call bacj [phone]

## 2019-04-27 NOTE — Patient Outreach (Signed)
[facility] Care Management  04/27/2019  Jeremy Kelley May 11, 1926 [id number]    Transition of care  RN reached out the the [facility] and introduced the [facility] program and purpose for today's call. RN further explained details on [facility] services with CSW and pharmacy. Further introduction completed and involvement with communicating to the provider office (Dr. Carlisle Cater) concerning pt's participation in the [facility] program and services. HCPOA at that time requested RN case manager to contact the pt directly as caregiver indicates pt can make his own decisions about his care and competent enough to communicate this information to at this time. States pt has live-in assistance for the next 1 1/2 week.   PLAN: RN will outreach to the pt on Monday concerning [facility] services and attempt to further engage with pt concerning his needs.   Raina Mina, RN Care Management Coordinator [place] Office [phone]

## 2019-04-27 NOTE — Telephone Encounter (Signed)
Ok for verbal orders ?

## 2019-04-27 NOTE — Telephone Encounter (Signed)
Verbal orders given to [facility].

## 2019-04-30 ENCOUNTER — Inpatient Hospital Stay (HOSPITAL_COMMUNITY): Payer: Medicare Other

## 2019-04-30 ENCOUNTER — Encounter (HOSPITAL_COMMUNITY): Payer: Self-pay | Admitting: Obstetrics and Gynecology

## 2019-04-30 ENCOUNTER — Inpatient Hospital Stay (HOSPITAL_COMMUNITY)
Admission: EM | Admit: 2019-04-30 | Discharge: 2019-05-08 | DRG: 522 | Disposition: A | Discharge status: Skilled Nursing Facility | Payer: Medicare Other | Attending: Internal Medicine | Admitting: Internal Medicine

## 2019-04-30 ENCOUNTER — Ambulatory Visit: Payer: Self-pay | Admitting: *Deleted

## 2019-04-30 ENCOUNTER — Emergency Department (HOSPITAL_COMMUNITY): Payer: Medicare Other

## 2019-04-30 ENCOUNTER — Other Ambulatory Visit: Payer: Self-pay

## 2019-04-30 DIAGNOSIS — R41841 Cognitive communication deficit: Secondary | ICD-10-CM | POA: Diagnosis not present

## 2019-04-30 DIAGNOSIS — Z7901 Long term (current) use of anticoagulants: Secondary | ICD-10-CM

## 2019-04-30 DIAGNOSIS — S40022A Contusion of left upper arm, initial encounter: Secondary | ICD-10-CM | POA: Diagnosis present

## 2019-04-30 DIAGNOSIS — Z7401 Bed confinement status: Secondary | ICD-10-CM | POA: Diagnosis not present

## 2019-04-30 DIAGNOSIS — Z66 Do not resuscitate: Secondary | ICD-10-CM | POA: Diagnosis not present

## 2019-04-30 DIAGNOSIS — I110 Hypertensive heart disease with heart failure: Secondary | ICD-10-CM | POA: Diagnosis not present

## 2019-04-30 DIAGNOSIS — Z96641 Presence of right artificial hip joint: Secondary | ICD-10-CM | POA: Diagnosis not present

## 2019-04-30 DIAGNOSIS — L89151 Pressure ulcer of sacral region, stage 1: Secondary | ICD-10-CM | POA: Diagnosis present

## 2019-04-30 DIAGNOSIS — R52 Pain, unspecified: Secondary | ICD-10-CM | POA: Diagnosis not present

## 2019-04-30 DIAGNOSIS — S72001S Fracture of unspecified part of neck of right femur, sequela: Secondary | ICD-10-CM | POA: Diagnosis not present

## 2019-04-30 DIAGNOSIS — Y92009 Unspecified place in unspecified non-institutional (private) residence as the place of occurrence of the external cause: Secondary | ICD-10-CM

## 2019-04-30 DIAGNOSIS — S40021A Contusion of right upper arm, initial encounter: Secondary | ICD-10-CM | POA: Diagnosis present

## 2019-04-30 DIAGNOSIS — S8011XA Contusion of right lower leg, initial encounter: Secondary | ICD-10-CM | POA: Diagnosis present

## 2019-04-30 DIAGNOSIS — F0280 Dementia in other diseases classified elsewhere without behavioral disturbance: Secondary | ICD-10-CM

## 2019-04-30 DIAGNOSIS — S299XXA Unspecified injury of thorax, initial encounter: Secondary | ICD-10-CM | POA: Diagnosis not present

## 2019-04-30 DIAGNOSIS — M25572 Pain in left ankle and joints of left foot: Secondary | ICD-10-CM | POA: Diagnosis not present

## 2019-04-30 DIAGNOSIS — F0390 Unspecified dementia without behavioral disturbance: Secondary | ICD-10-CM | POA: Diagnosis present

## 2019-04-30 DIAGNOSIS — R457 State of emotional shock and stress, unspecified: Secondary | ICD-10-CM | POA: Diagnosis not present

## 2019-04-30 DIAGNOSIS — H3530 Unspecified macular degeneration: Secondary | ICD-10-CM | POA: Diagnosis present

## 2019-04-30 DIAGNOSIS — G301 Alzheimer's disease with late onset: Secondary | ICD-10-CM | POA: Diagnosis not present

## 2019-04-30 DIAGNOSIS — R0902 Hypoxemia: Secondary | ICD-10-CM | POA: Diagnosis not present

## 2019-04-30 DIAGNOSIS — D62 Acute posthemorrhagic anemia: Secondary | ICD-10-CM | POA: Diagnosis not present

## 2019-04-30 DIAGNOSIS — I4891 Unspecified atrial fibrillation: Secondary | ICD-10-CM | POA: Diagnosis not present

## 2019-04-30 DIAGNOSIS — R32 Unspecified urinary incontinence: Secondary | ICD-10-CM

## 2019-04-30 DIAGNOSIS — M6281 Muscle weakness (generalized): Secondary | ICD-10-CM | POA: Diagnosis not present

## 2019-04-30 DIAGNOSIS — R2689 Other abnormalities of gait and mobility: Secondary | ICD-10-CM | POA: Diagnosis not present

## 2019-04-30 DIAGNOSIS — I272 Pulmonary hypertension, unspecified: Secondary | ICD-10-CM | POA: Diagnosis present

## 2019-04-30 DIAGNOSIS — Z79899 Other long term (current) drug therapy: Secondary | ICD-10-CM

## 2019-04-30 DIAGNOSIS — S8012XA Contusion of left lower leg, initial encounter: Secondary | ICD-10-CM | POA: Diagnosis present

## 2019-04-30 DIAGNOSIS — I2510 Atherosclerotic heart disease of native coronary artery without angina pectoris: Secondary | ICD-10-CM | POA: Diagnosis present

## 2019-04-30 DIAGNOSIS — Z8546 Personal history of malignant neoplasm of prostate: Secondary | ICD-10-CM

## 2019-04-30 DIAGNOSIS — Z96649 Presence of unspecified artificial hip joint: Secondary | ICD-10-CM

## 2019-04-30 DIAGNOSIS — R2681 Unsteadiness on feet: Secondary | ICD-10-CM | POA: Diagnosis not present

## 2019-04-30 DIAGNOSIS — Z85828 Personal history of other malignant neoplasm of skin: Secondary | ICD-10-CM | POA: Diagnosis not present

## 2019-04-30 DIAGNOSIS — M1990 Unspecified osteoarthritis, unspecified site: Secondary | ICD-10-CM | POA: Diagnosis present

## 2019-04-30 DIAGNOSIS — W19XXXA Unspecified fall, initial encounter: Secondary | ICD-10-CM

## 2019-04-30 DIAGNOSIS — W010XXA Fall on same level from slipping, tripping and stumbling without subsequent striking against object, initial encounter: Secondary | ICD-10-CM | POA: Diagnosis present

## 2019-04-30 DIAGNOSIS — I10 Essential (primary) hypertension: Secondary | ICD-10-CM | POA: Diagnosis present

## 2019-04-30 DIAGNOSIS — I6523 Occlusion and stenosis of bilateral carotid arteries: Secondary | ICD-10-CM | POA: Diagnosis not present

## 2019-04-30 DIAGNOSIS — L8990 Pressure ulcer of unspecified site, unspecified stage: Secondary | ICD-10-CM | POA: Insufficient documentation

## 2019-04-30 DIAGNOSIS — R1312 Dysphagia, oropharyngeal phase: Secondary | ICD-10-CM | POA: Diagnosis not present

## 2019-04-30 DIAGNOSIS — Z760 Encounter for issue of repeat prescription: Secondary | ICD-10-CM

## 2019-04-30 DIAGNOSIS — M25551 Pain in right hip: Secondary | ICD-10-CM | POA: Diagnosis present

## 2019-04-30 DIAGNOSIS — Z96642 Presence of left artificial hip joint: Secondary | ICD-10-CM | POA: Diagnosis present

## 2019-04-30 DIAGNOSIS — R64 Cachexia: Secondary | ICD-10-CM | POA: Diagnosis not present

## 2019-04-30 DIAGNOSIS — Z86010 Personal history of colonic polyps: Secondary | ICD-10-CM

## 2019-04-30 DIAGNOSIS — I5032 Chronic diastolic (congestive) heart failure: Secondary | ICD-10-CM | POA: Diagnosis present

## 2019-04-30 DIAGNOSIS — M2550 Pain in unspecified joint: Secondary | ICD-10-CM | POA: Diagnosis not present

## 2019-04-30 DIAGNOSIS — S72001A Fracture of unspecified part of neck of right femur, initial encounter for closed fracture: Principal | ICD-10-CM | POA: Diagnosis present

## 2019-04-30 DIAGNOSIS — I482 Chronic atrial fibrillation, unspecified: Secondary | ICD-10-CM | POA: Diagnosis not present

## 2019-04-30 DIAGNOSIS — E785 Hyperlipidemia, unspecified: Secondary | ICD-10-CM | POA: Diagnosis not present

## 2019-04-30 DIAGNOSIS — Z20822 Contact with and (suspected) exposure to covid-19: Secondary | ICD-10-CM | POA: Diagnosis not present

## 2019-04-30 DIAGNOSIS — S72041A Displaced fracture of base of neck of right femur, initial encounter for closed fracture: Secondary | ICD-10-CM | POA: Diagnosis not present

## 2019-04-30 DIAGNOSIS — Z419 Encounter for procedure for purposes other than remedying health state, unspecified: Secondary | ICD-10-CM

## 2019-04-30 DIAGNOSIS — Z4789 Encounter for other orthopedic aftercare: Secondary | ICD-10-CM | POA: Diagnosis not present

## 2019-04-30 DIAGNOSIS — Z681 Body mass index (BMI) 19 or less, adult: Secondary | ICD-10-CM

## 2019-04-30 DIAGNOSIS — Z8559 Personal history of malignant neoplasm of other urinary tract organ: Secondary | ICD-10-CM

## 2019-04-30 DIAGNOSIS — Z03818 Encounter for observation for suspected exposure to other biological agents ruled out: Secondary | ICD-10-CM | POA: Diagnosis not present

## 2019-04-30 DIAGNOSIS — Z471 Aftercare following joint replacement surgery: Secondary | ICD-10-CM | POA: Diagnosis not present

## 2019-04-30 DIAGNOSIS — Z9181 History of falling: Secondary | ICD-10-CM | POA: Diagnosis not present

## 2019-04-30 HISTORY — DX: Fracture of unspecified part of neck of right femur, initial encounter for closed fracture: S72.001A

## 2019-04-30 HISTORY — DX: Do not resuscitate: Z66

## 2019-04-30 LAB — CBC WITH DIFFERENTIAL/PLATELET
Abs Immature Granulocytes: 0.05 10*3/uL (ref 0.00–0.07)
Basophils Absolute: 0 10*3/uL (ref 0.0–0.1)
Basophils Relative: 0 %
Eosinophils Absolute: 0.1 10*3/uL (ref 0.0–0.5)
Eosinophils Relative: 1 %
HCT: 40.9 % (ref 39.0–52.0)
Hemoglobin: 13 g/dL (ref 13.0–17.0)
Immature Granulocytes: 1 %
Lymphocytes Relative: 6 %
Lymphs Abs: 0.6 10*3/uL — ABNORMAL LOW (ref 0.7–4.0)
MCH: 31.8 pg (ref 26.0–34.0)
MCHC: 31.8 g/dL (ref 30.0–36.0)
MCV: 100 fL (ref 80.0–100.0)
Monocytes Absolute: 0.5 10*3/uL (ref 0.1–1.0)
Monocytes Relative: 5 %
Neutro Abs: 8.9 10*3/uL — ABNORMAL HIGH (ref 1.7–7.7)
Neutrophils Relative %: 87 %
Platelets: 262 10*3/uL (ref 150–400)
RBC: 4.09 MIL/uL — ABNORMAL LOW (ref 4.22–5.81)
RDW: 13.8 % (ref 11.5–15.5)
WBC: 10.2 10*3/uL (ref 4.0–10.5)
nRBC: 0 % (ref 0.0–0.2)

## 2019-04-30 LAB — BASIC METABOLIC PANEL
Anion gap: 9 (ref 5–15)
BUN: 20 mg/dL (ref 8–23)
CO2: 27 mmol/L (ref 22–32)
Calcium: 10.1 mg/dL (ref 8.9–10.3)
Chloride: 105 mmol/L (ref 98–111)
Creatinine, Ser: 0.83 mg/dL (ref 0.61–1.24)
GFR calc Af Amer: >60 mL/min (ref >60)
GFR calc non Af Amer: >60 mL/min (ref >60)
Glucose, Bld: 111 mg/dL — ABNORMAL HIGH (ref 70–99)
Potassium: 4.5 mmol/L (ref 3.5–5.1)
Sodium: 141 mmol/L (ref 135–145)

## 2019-04-30 LAB — RESPIRATORY PANEL BY RT PCR (FLU A&B, COVID)
Influenza A by PCR: NEGATIVE
Influenza B by PCR: NEGATIVE
SARS Coronavirus 2 by RT PCR: NEGATIVE

## 2019-04-30 LAB — TYPE AND SCREEN
ABO/RH(D): A POS
Antibody Screen: NEGATIVE

## 2019-04-30 LAB — PROTIME-INR
INR: 1.3 — ABNORMAL HIGH (ref 0.8–1.2)
Prothrombin Time: 15.9 seconds — ABNORMAL HIGH (ref 11.4–15.2)

## 2019-04-30 MED ORDER — IPRATROPIUM BROMIDE 0.06 % NA SOLN
2.0000 | Freq: Two times a day (BID) | NASAL | Status: DC | PRN
  Filled 2019-04-30: qty 15

## 2019-04-30 MED ORDER — DOCUSATE SODIUM 100 MG PO CAPS
100.0000 mg | ORAL_CAPSULE | Freq: Two times a day (BID) | ORAL | Status: DC
  Administered 2019-04-30: 100 mg via ORAL
  Filled 2019-04-30 (×2): qty 1

## 2019-04-30 MED ORDER — FLUTICASONE PROPIONATE 50 MCG/ACT NA SUSP
2.0000 | Freq: Every day | NASAL | Status: DC
  Administered 2019-04-30 – 2019-05-07 (×5): 2 via NASAL
  Filled 2019-04-30 (×2): qty 16

## 2019-04-30 MED ORDER — K PHOS MONO-SOD PHOS DI & MONO 155-852-130 MG PO TABS
500.0000 mg | ORAL_TABLET | Freq: Two times a day (BID) | ORAL | Status: DC
  Administered 2019-05-01 – 2019-05-02 (×5): 500 mg via ORAL
  Filled 2019-04-30 (×9): qty 2

## 2019-04-30 MED ORDER — METOPROLOL SUCCINATE ER 25 MG PO TB24
25.0000 mg | ORAL_TABLET | Freq: Every day | ORAL | Status: DC
  Administered 2019-04-30 – 2019-05-07 (×6): 25 mg via ORAL
  Filled 2019-04-30 (×8): qty 1

## 2019-04-30 MED ORDER — PRAVASTATIN SODIUM 20 MG PO TABS
20.0000 mg | ORAL_TABLET | Freq: Every day | ORAL | Status: DC
  Administered 2019-04-30 – 2019-05-07 (×6): 20 mg via ORAL
  Filled 2019-04-30 (×8): qty 1

## 2019-04-30 MED ORDER — HYDROCODONE-ACETAMINOPHEN 5-325 MG PO TABS
1.0000 | ORAL_TABLET | Freq: Four times a day (QID) | ORAL | Status: DC | PRN
  Administered 2019-05-01 – 2019-05-03 (×4): 1 via ORAL
  Filled 2019-04-30 (×4): qty 1

## 2019-04-30 MED ORDER — FENTANYL CITRATE (PF) 100 MCG/2ML IJ SOLN
25.0000 ug | Freq: Once | INTRAMUSCULAR | Status: AC
  Administered 2019-04-30: 25 ug via INTRAVENOUS
  Filled 2019-04-30: qty 2

## 2019-04-30 MED ORDER — LIFITEGRAST 5 % OP SOLN: 1.0000 [drp] | Freq: Two times a day (BID) | OPHTHALMIC | Status: DC

## 2019-04-30 MED ORDER — MIRABEGRON ER 25 MG PO TB24
50.0000 mg | ORAL_TABLET | Freq: Every day | ORAL | Status: DC
  Administered 2019-04-30 – 2019-05-08 (×8): 50 mg via ORAL
  Filled 2019-04-30 (×9): qty 2

## 2019-04-30 MED ORDER — TORSEMIDE 20 MG PO TABS
20.0000 mg | ORAL_TABLET | Freq: Every day | ORAL | Status: DC
  Administered 2019-04-30 – 2019-05-08 (×8): 20 mg via ORAL
  Filled 2019-04-30 (×9): qty 1

## 2019-04-30 MED ORDER — DONEPEZIL HCL 5 MG PO TABS
5.0000 mg | ORAL_TABLET | Freq: Every day | ORAL | Status: DC
  Administered 2019-04-30 – 2019-05-07 (×7): 5 mg via ORAL
  Filled 2019-04-30 (×7): qty 1

## 2019-04-30 MED ORDER — ENOXAPARIN SODIUM 40 MG/0.4ML ~~LOC~~ SOLN
40.0000 mg | SUBCUTANEOUS | Status: AC
  Administered 2019-04-30 – 2019-05-02 (×3): 40 mg via SUBCUTANEOUS
  Filled 2019-04-30 (×3): qty 0.4

## 2019-04-30 MED ORDER — FLUOROMETHOLONE 0.1 % OP SUSP
1.0000 [drp] | Freq: Four times a day (QID) | OPHTHALMIC | Status: DC
  Administered 2019-04-30 – 2019-05-08 (×24): 1 [drp] via OPHTHALMIC
  Filled 2019-04-30: qty 5

## 2019-04-30 MED ORDER — SENNOSIDES-DOCUSATE SODIUM 8.6-50 MG PO TABS
1.0000 | ORAL_TABLET | Freq: Every day | ORAL | Status: DC
  Administered 2019-04-30 – 2019-05-07 (×5): 1 via ORAL
  Filled 2019-04-30 (×7): qty 1

## 2019-04-30 MED ORDER — MORPHINE SULFATE (PF) 2 MG/ML IV SOLN: 0.5000 mg | INTRAVENOUS | Status: DC | PRN

## 2019-04-30 MED ORDER — POTASSIUM CHLORIDE CRYS ER 10 MEQ PO TBCR
10.0000 meq | EXTENDED_RELEASE_TABLET | Freq: Every day | ORAL | Status: DC
  Administered 2019-05-01 – 2019-05-02 (×2): 10 meq via ORAL
  Filled 2019-04-30 (×4): qty 1

## 2019-04-30 MED ORDER — POLYETHYLENE GLYCOL 3350 17 G PO PACK: 17.0000 g | PACK | Freq: Every day | ORAL | Status: DC | PRN

## 2019-04-30 NOTE — H&P (Addendum)
History and Physical    Jeremy Kelley [id number] DOB: 1926-10-30 DOA: 04/30/2019  Referring MD/NP/PA: Carmin Muskrat, MD PCP: [name], NP  Patient coming from: home via EMS  Chief Complaint: Right leg pain  I have personally briefly reviewed patient's old medical records in [place]   HPI: Jeremy Kelley is a 86 y.o. male with medical history significant of hypertension, hyperlipidemia, diastolic congestive heart failure, pulmonary hypertension, chronic atrial fibrillation on anticoagulation, carotid artery stenosis 40-60% b/l, chronic back pain/DDD, urothelial carcinoma, prostate cancer, and recent closed fracture of the left hip status post hemiarthroplasty by Dr. Lorin Mercy on 03/26/19.  He presents after having a fall at home.  He had been going from the kitchen to the dining room when he bumped into a friend that was visiting, and he fell landing on his right hip.  He reports that he already was weak on the left side due to the previous fracture that he suffered last month and had just gotten back home from being in rehab.  He would like Dr. Lorin Mercy to repair his right leg as well.  Patient reports being unable to bear any weight on the right leg due to pain.  Denies any loss of consciousness, palpitations, chest pain, or significant trauma to his head.   ED Course: On admission patient's vital signs were noted to be stable.  X-rays of the right pelvis revealed a acute displaced femoral neck fracture.  Labs are relatively unremarkable.  COVID-19 screening was negative.  Orthopedics was consulted.  TRH called to admit.  Review of Systems  Constitutional: Negative for fever and malaise/fatigue.  HENT: Negative for ear pain and sinus pain.   Eyes: Negative for double vision and photophobia.  Respiratory: Negative for cough and shortness of breath.   Cardiovascular: Negative for chest pain and leg swelling.  Gastrointestinal: Negative for abdominal pain and diarrhea.    Genitourinary: Negative for dysuria and hematuria.  Musculoskeletal: Positive for falls and joint pain.  Skin: Negative for rash.  Neurological: Negative for focal weakness and loss of consciousness.  Psychiatric/Behavioral: Negative for substance abuse. The patient is not nervous/anxious.     Past Medical History:  Diagnosis Date  . Arthritis   . Carotid artery stenosis 11/2008   bilateral 40-59% stenosis  . Colon polyps   . Diverticulosis   . Hepatitis    doesn't know which type 1969  . Hyperlipidemia   . Hypertension   . Numbness    fingertips  . Phimosis   . Prostate cancer ([place]) 2001  . Urothelial carcinoma of left distal ureter [facility])     Past Surgical History:  Procedure Laterality Date  . BIOPSY  10/01/2011   Procedure: BIOPSY;  Surgeon: [name], MD;  Location: [facility];  Service: Urology;;  biopsy of bladder tumor  . BLADDER SUSPENSION  08/18/10   [name]  . CARDIAC CATHETERIZATION N/A 07/29/2015   Procedure: Right/Left Heart Cath and Coronary Angiography;  Surgeon: Peter M Martinique, MD;  Location: [place] [facility];  Service: Cardiovascular;  Laterality: N/A;  . CATARACT EXTRACTION W/ INTRAOCULAR LENS  IMPLANT, BILATERAL Bilateral   . CYSTOSCOPY W/ RETROGRADES  01/27/2012   Procedure: CYSTOSCOPY WITH RETROGRADE PYELOGRAM;  Surgeon: [name], MD;  Location: [facility];  Service: Urology;  Laterality: Left;  . CYSTOSCOPY WITH RETROGRADE PYELOGRAM, URETEROSCOPY AND STENT PLACEMENT Left 11/24/2012   Procedure: CYSTOSCOPY WITH left RETROGRADE PYELOGRAM, bladder washings, ureteroscopy;  Surgeon: [name], MD;  Location: [facility];  Service: Urology;  Laterality: Left;  . CYSTOSCOPY WITH RETROGRADE PYELOGRAM, URETEROSCOPY AND STENT PLACEMENT Bilateral 11/19/2013   Procedure: CYSTOSCOPY WITH BILATERAL RETROGRADE PYELOGRAM,;  Surgeon: [name], MD;  Location: [facility];  Service: Urology;  Laterality: Bilateral;  . EYE SURGERY  09/24/10   left eye. "Retina surgery"  . HIP  ARTHROPLASTY Left 03/26/2019   Procedure: ARTHROPLASTY BIPOLAR HIP (HEMIARTHROPLASTY);  Surgeon: Marybelle Killings, MD;  Location: [facility];  Service: Orthopedics;  Laterality: Left;  Depuy press fit, lateral position  . INGUINAL HERNIA REPAIR  1960  . PROSTATE SURGERY     s/p laparoscopic prostate surgery  . SKIN BIOPSY  05/20/2017   Superficial and nodular basal cell carcinoma (left temple)    basal cell carcinoma with sclerosis (right temple)  . 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 SKIN CANCER EXCISION     a/p skin cancer right foot-non melanoma  . skin laceration    . TEE WITHOUT CARDIOVERSION N/A 07/29/2015   Procedure: TRANSESOPHAGEAL ECHOCARDIOGRAM (TEE);  Surgeon: Pixie Casino, MD;  [facility];  Service: Cardiovascular;  Laterality: N/A;  cath to follow  . TRANSURETHRAL RESECTION OF BLADDER TUMOR  10/01/2011   Procedure: TRANSURETHRAL RESECTION OF BLADDER TUMOR (TURBT);  Surgeon: [name], MD;  Location: [facility];  Service: Urology;  Laterality: N/A;  . TRANSURETHRAL RESECTION OF BLADDER TUMOR N/A 11/19/2013   Procedure: TRANSURETHRAL RESECTION OF BLADDER TUMOR (TURBT);  Surgeon: [name], MD;  Location: [facility];  Service: Urology;  Laterality: N/A;  . URETEROSCOPY  01/27/2012   Procedure: URETEROSCOPY;  Surgeon: [name], MD;  Location: [facility];  Service: Urology;  Laterality: Left;  CYSTO, Left RETROGRADE PYELOGRPHY, LEFT URETEROSCOPY   . URINARY SPHINCTER IMPLANT    . VARICOSE VEIN SURGERY       reports that he has never smoked. He has never used smokeless tobacco. He reports current alcohol use of about 14.0 standard drinks of alcohol per week. He reports that he does not use drugs.  No Known Allergies  Family History  Problem Relation Age of Onset  . Colon cancer Neg Hx   . Colon polyps Neg Hx     Prior to Admission medications   Medication Sig Start Date End Date Taking? Authorizing Provider    acetaminophen (TYLENOL) 325 MG tablet Take 1-2 tablets (325-650 mg total) by mouth every 6 (six) hours as needed for mild pain or moderate pain (pain score 1-3 or temp > 100.5). Patient taking differently: Take 650 mg by mouth every 6 (six) hours as needed for mild pain or moderate pain (pain score 1-3 or temp > 100.5).  03/30/19   Lucky Cowboy, MD  apixaban (ELIQUIS) 2.5 MG TABS tablet Take 1 tablet (2.5 mg total) by mouth 2 (two) times daily. 04/21/19   Hennie Duos, MD  B Complex-C (B-COMPLEX WITH VITAMIN C) tablet Take 1 tablet by mouth daily. Reported on 07/24/2015 08/18/16   [name], NP  cholecalciferol (VITAMIN D) 1000 units tablet Take 1 tablet (1,000 Units total) by mouth daily. 08/18/16   Nafziger, [name], NP  docusate sodium (COLACE) 100 MG capsule Take 1 capsule (100 mg total) by mouth 2 (two) times daily. 02/24/19   Shawna Clamp, MD  donepezil (ARICEPT) 5 MG tablet One tablet daily 04/21/19   Hennie Duos, MD  fluorometholone (FML) 0.1 % ophthalmic suspension Place 1 drop into both eyes 4 (four) times daily. 04/21/19  Hennie Duos, MD  fluticasone [facility]) 50 MCG/ACT nasal spray Place 2 sprays into both nostrils at bedtime. 04/21/19   Hennie Duos, MD  ipratropium (ATROVENT) 0.06 % nasal spray Place 2 sprays into the nose 2 (two) times daily as needed. Nasal drainage 04/21/19   Hennie Duos, MD  metoprolol succinate (TOPROL-XL) 25 MG 24 hr tablet TAKE 1 TABLET BY MOUTH EVERYDAY AT BEDTIME 04/24/19   Nafziger, [name], NP  mirabegron ER (MYRBETRIQ) 50 MG TB24 tablet Take 1 tablet (50 mg total) by mouth daily. 04/21/19   Hennie Duos, MD  Nutritional Supplement LIQD Take 120 mLs by mouth daily. [employer]    [provider]  phosphorus (K PHOS NEUTRAL) 155-852-130 MG tablet Take 2 tablets (500 mg total) by mouth 2 (two) times daily. 04/21/19   Hennie Duos, MD  polyethylene glycol (MIRALAX / GLYCOLAX) 17 g packet Take 17 g by mouth daily as needed for mild  constipation.     [provider]  potassium chloride (MICRO-K) 10 MEQ CR capsule Take 1 capsule (10 mEq total) by mouth daily. 04/21/19   Hennie Duos, MD  pravastatin (PRAVACHOL) 20 MG tablet TAKE 1 TABLET BY MOUTH EVERYDAY AT BEDTIME 04/21/19   Hennie Duos, MD  torsemide (DEMADEX) 20 MG tablet Take 1 tablet (20 mg total) by mouth daily. 04/21/19   Hennie Duos, MD  XIIDRA 5 % SOLN Place 1 drop into both eyes 2 (two) times daily. 04/21/19   Hennie Duos, MD    Physical Exam:  Constitutional: elderly man who appears to be in no acute distress Vitals:   04/30/19 1115 04/30/19 1130 04/30/19 1138 04/30/19 1235  BP:  (!) 136/59 (!) 135/59   Pulse: 72 71 71 72  Resp:   17   Temp:      TempSrc:      SpO2: (!) 89% (!) 85% 93% 94%   Eyes: PERRL, lids and conjunctivae normal ENMT: Mucous membranes are dry. Posterior pharynx clear of any exudate or lesions.  Neck: normal, supple, no masses, no thyromegaly Respiratory: clear to auscultation bilaterally, no wheezing, no crackles. Normal respiratory effort. No accessory muscle use.  Cardiovascular: Irregular irregular, no murmurs / rubs / gallops. No extremity edema. 2+ pedal pulses. No carotid bruits.  Abdomen: no tenderness, no masses palpated. No hepatosplenomegaly. Bowel sounds positive.  Musculoskeletal: no clubbing / cyanosis.  Right hip externally rotated and mildly shortened. Skin: Multiple bruises noted of the upper lower extremities. Neurologic: CN 2-12 grossly intact. Sensation intact, DTR normal. Strength 5/5 in all 4.  Psychiatric: Normal judgment and insight. Alert and oriented x 3. Normal mood.     Labs on Admission: I have personally reviewed following labs and imaging studies  CBC: Recent Labs  Lab 04/26/19 1128 04/30/19 1015  WBC 10.3 10.2  NEUTROABS 8.7* 8.9*  HGB 13.1 13.0  HCT 39.8 40.9  MCV 96.1 100.0  PLT 243.0 99991111   Basic Metabolic Panel: Recent Labs  Lab 04/26/19 1128 04/30/19 1015    NA 143 141  K 4.5 4.5  CL 104 105  CO2 32 27  GLUCOSE 94 111*  BUN 18 20  CREATININE 1.00 0.83  CALCIUM 9.8 10.1   GFR: Estimated Creatinine Clearance: 47.4 mL/min (by C-G formula based on SCr of 0.83 mg/dL). Liver Function Tests: No results for input(s): AST, ALT, ALKPHOS, BILITOT, PROT, ALBUMIN in the last 168 hours. No results for input(s): LIPASE, AMYLASE in the last 168 hours. No  results for input(s): AMMONIA in the last 168 hours. Coagulation Profile: Recent Labs  Lab 04/30/19 1015  INR 1.3*   Cardiac Enzymes: No results for input(s): CKTOTAL, CKMB, CKMBINDEX, TROPONINI in the last 168 hours. BNP (last 3 results) Recent Labs    08/31/18 1247  PROBNP 459.0*   HbA1C: No results for input(s): HGBA1C in the last 72 hours. CBG: No results for input(s): GLUCAP in the last 168 hours. Lipid Profile: No results for input(s): CHOL, HDL, LDLCALC, TRIG, CHOLHDL, LDLDIRECT in the last 72 hours. Thyroid Function Tests: No results for input(s): TSH, T4TOTAL, FREET4, T3FREE, THYROIDAB in the last 72 hours. Anemia Panel: No results for input(s): VITAMINB12, FOLATE, FERRITIN, TIBC, IRON, RETICCTPCT in the last 72 hours. Urine analysis:    Component Value Date/Time   COLORURINE YELLOW 02/21/2019 1100   APPEARANCEUR CLEAR 02/21/2019 1100   LABSPEC 1.015 02/21/2019 1100   PHURINE 7.0 02/21/2019 1100   GLUCOSEU NEGATIVE 02/21/2019 1100   HGBUR NEGATIVE 02/21/2019 1100   BILIRUBINUR NEGATIVE 02/21/2019 1100   BILIRUBINUR n 07/15/2015 1320   KETONESUR NEGATIVE 02/21/2019 1100   PROTEINUR NEGATIVE 02/21/2019 1100   UROBILINOGEN 0.2 07/15/2015 1320   NITRITE NEGATIVE 02/21/2019 1100   LEUKOCYTESUR NEGATIVE 02/21/2019 1100   Sepsis Labs: Recent Results (from the past 240 hour(s))  Respiratory Panel by RT PCR (Flu A&B, Covid) - Nasopharyngeal Swab     Status: None   Collection Time: 04/30/19 10:13 AM   Specimen: Nasopharyngeal Swab  Result Value Ref Range Status   SARS  Coronavirus 2 by RT PCR NEGATIVE NEGATIVE Final    Comment: (NOTE) SARS-CoV-2 target nucleic acids are NOT DETECTED. The SARS-CoV-2 RNA is generally detectable in upper respiratoy specimens during the acute phase of infection. The lowest concentration of SARS-CoV-2 viral copies this assay can detect is 131 copies/mL. A negative result does not preclude SARS-Cov-2 infection and should not be used as the sole basis for treatment or other patient management decisions. A negative result may occur with  improper specimen collection/handling, submission of specimen other than nasopharyngeal swab, presence of viral mutation(s) within the areas targeted by this assay, and inadequate number of viral copies (<131 copies/mL). A negative result must be combined with clinical observations, patient history, and epidemiological information. The expected result is Negative. Fact Sheet for Patients:  PinkCheek.be Fact Sheet for Healthcare Providers:  GravelBags.it This test is not yet ap proved or cleared by the [place] FDA and  has been authorized for detection and/or diagnosis of SARS-CoV-2 by FDA under an Emergency Use Authorization (EUA). This EUA will remain  in effect (meaning this test can be used) for the duration of the COVID-19 declaration under Section 564(b)(1) of the Act, 21 U.S.C. section 360bbb-3(b)(1), unless the authorization is terminated or revoked sooner.    Influenza A by PCR NEGATIVE NEGATIVE Final   Influenza B by PCR NEGATIVE NEGATIVE Final    Comment: (NOTE) The Xpert Xpress SARS-CoV-2/FLU/RSV assay is intended as an aid in  the diagnosis of influenza from Nasopharyngeal swab specimens and  should not be used as a sole basis for treatment. Nasal washings and  aspirates are unacceptable for Xpert Xpress SARS-CoV-2/FLU/RSV  testing. Fact Sheet for Patients: PinkCheek.be Fact Sheet  for Healthcare Providers: GravelBags.it This test is not yet approved or cleared by the [place] FDA and  has been authorized for detection and/or diagnosis of SARS-CoV-2 by  FDA under an Emergency Use Authorization (EUA). This EUA will remain  in effect (meaning this test  can be used) for the duration of the  Covid-19 declaration under Section 564(b)(1) of the Act, 21  U.S.C. section 360bbb-3(b)(1), unless the authorization is  terminated or revoked. Performed at [facility], [place] [address], [place], [place] [address]      Radiological Exams on Admission: DG Hip Unilat With Pelvis 2-3 Views Right  Result Date: 04/30/2019 CLINICAL DATA:  [place]. Right hip pain. EXAM: DG HIP (WITH OR WITHOUT PELVIS) 2-3V RIGHT COMPARISON:  04/10/2019 FINDINGS: Displaced right femoral neck fracture. Left hip prosthesis is intact. No complicating features. The visualized bony pelvis is intact. IMPRESSION: Displaced right femoral neck fracture. Electronically Signed   By: Marijo Sanes M.D.   On: 04/30/2019 09:59    EKG: Independently reviewed.  Atrial fibrillation at 73 bpm  Assessment/Plan Displaced right femoral neck fracture secondary to fall: Acute.  Patient presents after having a fall at home with complaints of right hip pain.  Found to have an acute dislocated fracture at the right femoral neck.  Dr. Erlinda Hong Orthopedics was consulted, but family would like to have Dr. Lorin Mercy from surgery. -Admit to the MedSurg bed -Hip fracture order set utilized -Hydrocodone/morphine as needed pain -Transitions of care team as patient will likely need rehab again -Appreciate orthopedic consultative services, we will follow-up for further recommendation   Chronic atrial fibrillation on chronic anticoagulation: Patient appears to be rate controlled currently at this time.  Patient normally on Eliquis, but last dose taken on 1/17 at 1800. -Hold Eliquis -Continue  metoprolol  Mild dementia -Continue Aricept  Essential hypertension: Blood pressure stable -Continue current regimen  Pulmonary hypertension with diastolic CHF: Last echocardiogram from January 2020 noted preserved EF of 55 to [id number] with diastolic function unable to be evaluated.  Patient appears to be euvolemic at this time. -Continue torsemide 20 mg   Hyperlipidemia -Continue pravastatin  DNR: Present on admission.   DVT prophylaxis: Lovenox, but we will need to restart on anticoagulation medically appropriate Code Status: DNR Family Communication: Discussed plan of care with Mr. Orbie Hurst Disposition Plan: Likely discharge to rehab once medically stable Consults called: Orthopedics Admission status: Inpatient   Norval Morton MD [facility] Pager [phone]   If 7PM-7AM, please contact night-coverage [email or web address] Password [facility]  04/30/2019, 12:39 PM

## 2019-04-30 NOTE — ED Notes (Signed)
Patient given meal tray and repositioned in bed.

## 2019-04-30 NOTE — ED Notes (Signed)
Updated Win (POA) patients son on plan of care.

## 2019-04-30 NOTE — ED Notes (Signed)
Pts brief was saturated in urine upon arrival to the ED. Pt also had small BM. Peri care performed, brief changed, and condom cath applied.

## 2019-04-30 NOTE — ED Notes (Signed)
Pt states that the condom cath is providing him discomfort.  Removed condom cath and helped pt void while I held urinal.  Pt foreskin appears a bit red and irritated.  Pt states that he has had a BM, pt changed and cleaned and repositioned in bed (depend in place) pt has soft brown BM. Pt repositioned for comfort.

## 2019-04-30 NOTE — ED Notes (Signed)
Patient repositioned in bed and given meal tray. Patient feeding himself with no problems noted.

## 2019-04-30 NOTE — ED Triage Notes (Signed)
Patient presents to the ED with fall. Patient is from home. Patient hit his right hip when he fell and reports he is unable to stand. Patient just got home from rehab. Patient reportedly got up to go to the bathroom and fell.

## 2019-04-30 NOTE — ED Notes (Signed)
[facility] has been dispatched.  

## 2019-04-30 NOTE — ED Notes (Signed)
Report given to [facility] 

## 2019-04-30 NOTE — ED Notes (Signed)
Patient given water

## 2019-04-30 NOTE — ED Notes (Signed)
Rounded on pt.  Pt alert and oriented.  No needs at this time

## 2019-04-30 NOTE — ED Provider Notes (Signed)
[facility] DEPT Provider Note   CSN: [id number] Arrival date & time: 04/30/19  [id number]     History Chief Complaint  Patient presents with  . Fall    Jeremy Kelley is a 86 y.o. male.  HPI    Patient presents after mechanical fall.  Patient has notable history of fall last month requiring left hip replacement. He notes that he was recovering generally well, though he only recently began taking his Eliquis again, he was also taking his medications regularly. Today he had a mechanical fall, slipped, falling onto his right side. No head trauma, no loss of consciousness.  He has been unable to bear weight since the fall secondary to pain in the right hip.  Pain is sharp, severe, worse with any motion. It is unclear if he has had any medication for pain relief.   Past Medical History:  Diagnosis Date  . Arthritis   . Carotid artery stenosis 11/2008   bilateral 40-59% stenosis  . Colon polyps   . Diverticulosis   . Hepatitis    doesn't know which type 1969  . Hyperlipidemia   . Hypertension   . Numbness    fingertips  . Phimosis   . Prostate cancer ([place]) 2001  . Urothelial carcinoma of left distal ureter [facility])     Patient Active Problem List   Diagnosis Date Noted  . History of hemiarthroplasty of left hip 04/25/2019  . Hip fracture ([place]) 04/21/2019  . Dementia ([place]) 04/08/2019  . S/P hip hemiarthroplasty   . Small bowel obstruction ([place]) 02/21/2019  . Closed pilon fracture, left, sequela 10/11/2016  . Pain in left ankle and joints of left foot 10/04/2016  . Idiopathic chronic venous hypertension of left lower extremity with ulcer and inflammation ([place]) 10/04/2016  . Fatigue 11/04/2015  . CAD (coronary artery disease) 08/01/2015  . Pulmonary hypertension ([place]) 07/24/2015  . Atrial fibrillation ([place]) 07/16/2015  . Aortic insufficiency 01/04/2014  . Squamous cell carcinoma in situ of skin of right lower leg 11/13/2013  . Lentigo maligna  of cheek ([place]) 09/19/2013  . Actinic keratosis 11/06/2012  . Macular degeneration 08/10/2012  . Allergic rhinitis 08/10/2012  . Bladder cancer ([place]) 10/19/2011  . Eczema 02/23/2011  . Hyperglycemia 02/11/2011  . Heart murmur 09/10/2010  . ABNORMAL THYROID FUNCTION TESTS 08/05/2009  . ABNORMAL HEART RHYTHMS 07/11/2009  . Hyperlipidemia 11/28/2008  . CAROTID BRUIT 11/19/2008  . UNSPECIFIED CATARACT 10/17/2008  . PARESTHESIA 12/04/2007  . Varicose veins of bilateral lower extremities with other complications [id number]  . COLONIC POLYPS, HX OF 08/28/2007  . Essential hypertension 02/08/2007  . PROSTATE CANCER, HX OF 11/04/2006  . INGUINAL HERNIA, HX OF 11/04/2006    Past Surgical History:  Procedure Laterality Date  . BIOPSY  10/01/2011   Procedure: BIOPSY;  Surgeon: [name], MD;  [facility]: [facility];  Service: Urology;;  biopsy of bladder tumor  . BLADDER SUSPENSION  08/18/10   [name]  . CARDIAC CATHETERIZATION N/A 07/29/2015   Procedure: Right/Left Heart Cath and Coronary Angiography;  Surgeon: Peter M Martinique, MD;  Location: [facility] CV LAB;  Service: Cardiovascular;  Laterality: N/A;  . CATARACT EXTRACTION W/ INTRAOCULAR LENS  IMPLANT, BILATERAL Bilateral   . CYSTOSCOPY W/ RETROGRADES  01/27/2012   Procedure: CYSTOSCOPY WITH RETROGRADE PYELOGRAM;  Surgeon: [name], MD;  [facility];  Service: Urology;  Laterality: Left;  . CYSTOSCOPY WITH RETROGRADE PYELOGRAM, URETEROSCOPY AND STENT PLACEMENT Left 11/24/2012   Procedure: CYSTOSCOPY WITH left RETROGRADE  PYELOGRAM, bladder washings, ureteroscopy;  Surgeon: [name], MD;  [facility];  Service: Urology;  Laterality: Left;  . CYSTOSCOPY WITH RETROGRADE PYELOGRAM, URETEROSCOPY AND STENT PLACEMENT Bilateral 11/19/2013   Procedure: CYSTOSCOPY WITH BILATERAL RETROGRADE PYELOGRAM,;  Surgeon: [name], MD;  [facility];  Service: Urology;  Laterality: Bilateral;  . EYE SURGERY  09/24/10   left eye. "Retina  surgery"  . HIP ARTHROPLASTY Left 03/26/2019   Procedure: ARTHROPLASTY BIPOLAR HIP (HEMIARTHROPLASTY);  Surgeon: Marybelle Killings, MD;  [facility];  Service: Orthopedics;  Laterality: Left;  Depuy press fit, lateral position  . INGUINAL HERNIA REPAIR  1960  . PROSTATE SURGERY     s/p laparoscopic prostate surgery  . SKIN BIOPSY  05/20/2017   Superficial and nodular basal cell carcinoma (left temple)    basal cell carcinoma with sclerosis (right temple)  . 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 SKIN CANCER EXCISION     a/p skin cancer right foot-non melanoma  . skin laceration    . TEE WITHOUT CARDIOVERSION N/A 07/29/2015   Procedure: TRANSESOPHAGEAL ECHOCARDIOGRAM (TEE);  Surgeon: Pixie Casino, MD;  Location[facility] ENDOSCOPY;  Service: Cardiovascular;  Laterality: N/A;  cath to follow  . TRANSURETHRAL RESECTION OF BLADDER TUMOR  10/01/2011   Procedure: TRANSURETHRAL RESECTION OF BLADDER TUMOR (TURBT);  Surgeon: [name], MD;  [facility];  Service: Urology;  Laterality: N/A;  . TRANSURETHRAL RESECTION OF BLADDER TUMOR N/A 11/19/2013   Procedure: TRANSURETHRAL RESECTION OF BLADDER TUMOR (TURBT);  Surgeon: [name], MD;  [facility];  Service: Urology;  Laterality: N/A;  . URETEROSCOPY  01/27/2012   Procedure: URETEROSCOPY;  Surgeon: [name], MD;  [facility];  Service: Urology;  Laterality: Left;  CYSTO, Left RETROGRADE PYELOGRPHY, LEFT URETEROSCOPY   . URINARY SPHINCTER IMPLANT    . VARICOSE VEIN SURGERY         Family History  Problem Relation Age of Onset  . Colon cancer Neg Hx   . Colon polyps Neg Hx     Social History   Tobacco Use  . Smoking status: Never Smoker  . Smokeless tobacco: Never Used  . Tobacco comment: 07/29/2015 "might smoke a cigar once/year"  Substance Use Topics  . Alcohol use: Yes    Alcohol/week: 14.0 standard drinks    Types: 14 Glasses of wine per week    Comment:  07/29/2015 "big glass of wine q night"  . Drug use: No    Home Medications Prior to Admission medications   Medication Sig Start Date End Date Taking? Authorizing Provider  acetaminophen (TYLENOL) 325 MG tablet Take 1-2 tablets (325-650 mg total) by mouth every 6 (six) hours as needed for mild pain or moderate pain (pain score 1-3 or temp > 100.5). Patient taking differently: Take 650 mg by mouth every 6 (six) hours as needed for mild pain or moderate pain (pain score 1-3 or temp > 100.5).  03/30/19   Lucky Cowboy, MD  apixaban (ELIQUIS) 2.5 MG TABS tablet Take 1 tablet (2.5 mg total) by mouth 2 (two) times daily. 04/21/19   Hennie Duos, MD  B Complex-C (B-COMPLEX WITH VITAMIN C) tablet Take 1 tablet by mouth daily. Reported on 07/24/2015 08/18/16   [name], NP  cholecalciferol (VITAMIN D) 1000 units tablet Take 1 tablet (1,000 Units total) by mouth daily. 08/18/16   Nafziger, [name], NP  docusate sodium (COLACE) 100 MG capsule Take 1  capsule (100 mg total) by mouth 2 (two) times daily. 02/24/19   Shawna Clamp, MD  donepezil (ARICEPT) 5 MG tablet One tablet daily 04/21/19   Hennie Duos, MD  fluorometholone (FML) 0.1 % ophthalmic suspension Place 1 drop into both eyes 4 (four) times daily. 04/21/19   Hennie Duos, MD  fluticasone (FLONASE) 50 MCG/ACT nasal spray Place 2 sprays into both nostrils at bedtime. 04/21/19   Hennie Duos, MD  ipratropium (ATROVENT) 0.06 % nasal spray Place 2 sprays into the nose 2 (two) times daily as needed. Nasal drainage 04/21/19   Hennie Duos, MD  metoprolol succinate (TOPROL-XL) 25 MG 24 hr tablet TAKE 1 TABLET BY MOUTH EVERYDAY AT BEDTIME 04/24/19   Nafziger, [name], NP  mirabegron ER (MYRBETRIQ) 50 MG TB24 tablet Take 1 tablet (50 mg total) by mouth daily. 04/21/19   Hennie Duos, MD  Nutritional Supplement LIQD Take 120 mLs by mouth daily. MedPass    [provider]  phosphorus (K PHOS NEUTRAL) 155-852-130 MG tablet Take 2 tablets  (500 mg total) by mouth 2 (two) times daily. 04/21/19   Hennie Duos, MD  polyethylene glycol (MIRALAX / GLYCOLAX) 17 g packet Take 17 g by mouth daily as needed for mild constipation.     [provider]  potassium chloride (MICRO-K) 10 MEQ CR capsule Take 1 capsule (10 mEq total) by mouth daily. 04/21/19   Hennie Duos, MD  pravastatin (PRAVACHOL) 20 MG tablet TAKE 1 TABLET BY MOUTH EVERYDAY AT BEDTIME 04/21/19   Hennie Duos, MD  torsemide (DEMADEX) 20 MG tablet Take 1 tablet (20 mg total) by mouth daily. 04/21/19   Hennie Duos, MD  XIIDRA 5 % SOLN Place 1 drop into both eyes 2 (two) times daily. 04/21/19   Hennie Duos, MD    Allergies    Patient has no known allergies.  Review of Systems   Review of Systems  Constitutional:       Per HPI, otherwise negative  HENT:       Per HPI, otherwise negative  Respiratory:       Per HPI, otherwise negative  Cardiovascular:       Per HPI, otherwise negative  Gastrointestinal: Negative for vomiting.  Endocrine:       Negative aside from HPI  Genitourinary:       Neg aside from HPI   Musculoskeletal:       Per HPI, otherwise negative  Skin: Negative.   Neurological: Negative for syncope and weakness.    Physical Exam Updated Vital Signs BP (!) 135/59 (BP Location: Left Leg)   Pulse 71   Temp 97.8 F (36.6 C) (Oral)   Resp 17   SpO2 93%   Physical Exam Vitals and nursing note reviewed.  Constitutional:      General: He is not in acute distress.    Appearance: He is well-developed.  HENT:     Head: Normocephalic and atraumatic.  Eyes:     Conjunctiva/sclera: Conjunctivae normal.  Cardiovascular:     Rate and Rhythm: Normal rate and regular rhythm.  Pulmonary:     Effort: Pulmonary effort is normal. No respiratory distress.     Breath sounds: No stridor.  Abdominal:     General: There is no distension.  Musculoskeletal:       Legs:  Skin:    General: Skin is warm and dry.  Neurological:      Mental Status: He is alert and  oriented to person, place, and time.     ED Results / Procedures / Treatments   Labs (all labs ordered are listed, but only abnormal results are displayed) Labs Reviewed  BASIC METABOLIC PANEL - Abnormal; Notable for the following components:      Result Value   Glucose, Bld 111 (*)    All other components within normal limits  CBC WITH DIFFERENTIAL/PLATELET - Abnormal; Notable for the following components:   RBC 4.09 (*)    Neutro Abs 8.9 (*)    Lymphs Abs 0.6 (*)    All other components within normal limits  PROTIME-INR - Abnormal; Notable for the following components:   Prothrombin Time 15.9 (*)    INR 1.3 (*)    All other components within normal limits  RESPIRATORY PANEL BY RT PCR (FLU A&B, COVID)  TYPE AND SCREEN    EKG EKG Interpretation  Date/Time:  Monday April 30 2019 09:55:48 EST Ventricular Rate:  73 PR Interval:    QRS Duration: 86 QT Interval:  394 QTC Calculation: 434 R Axis:   -82 Text Interpretation: Atrial fibrillation Left axis deviation T wave abnormality Abnormal ECG Confirmed by Carmin Muskrat [phone]) on 04/30/2019 11:31:10 AM   Radiology DG Hip Unilat With Pelvis 2-3 Views Right  Result Date: 04/30/2019 CLINICAL DATA:  [place]. Right hip pain. EXAM: DG HIP (WITH OR WITHOUT PELVIS) 2-3V RIGHT COMPARISON:  04/10/2019 FINDINGS: Displaced right femoral neck fracture. Left hip prosthesis is intact. No complicating features. The visualized bony pelvis is intact. IMPRESSION: Displaced right femoral neck fracture. Electronically Signed   By: Marijo Sanes M.D.   On: 04/30/2019 09:59    Procedures Procedures (including critical care time)  Medications Ordered in ED Medications  fentaNYL (SUBLIMAZE) injection 25 mcg (25 mcg Intravenous Given 04/30/19 1033)    ED Course  I have reviewed the triage vital signs and the nursing notes.  Pertinent labs & imaging results that were available during my care of the patient were  reviewed by me and considered in my medical decision making (see chart for details).  Chart reviewed after initial evaluation notable for hemiarthroplasty within the past month,.  Outpatient follow-up with indication for ongoing physical therapy, Occupational Therapy.   MDM Rules/Calculators/A&P                      12:15 PM I discussed patient's case with her orthopedic surgeon on-call, a colleague of the patient's own orthopedic surgeon. With findings concerning for right femoral neck fracture will require admission for surgical repair. Initial labs generally unremarkable.  Patient is aware of findings as well, understands need for admission, surgery.  Final Clinical Impression(s) / ED Diagnoses Final diagnoses:  Fall, initial encounter  Closed fracture of right hip requiring operative repair, initial encounter [facility])     Carmin Muskrat, MD 04/30/19 1216

## 2019-04-30 NOTE — ED Notes (Signed)
Pt. Documented in error see above note in chart. 

## 2019-05-01 DIAGNOSIS — S72001A Fracture of unspecified part of neck of right femur, initial encounter for closed fracture: Principal | ICD-10-CM

## 2019-05-01 LAB — BASIC METABOLIC PANEL
Anion gap: 8 (ref 5–15)
BUN: 20 mg/dL (ref 8–23)
CO2: 28 mmol/L (ref 22–32)
Calcium: 9.6 mg/dL (ref 8.9–10.3)
Chloride: 106 mmol/L (ref 98–111)
Creatinine, Ser: 1.08 mg/dL (ref 0.61–1.24)
GFR calc Af Amer: >60 mL/min (ref >60)
GFR calc non Af Amer: >60 mL/min (ref >60)
Glucose, Bld: 100 mg/dL — ABNORMAL HIGH (ref 70–99)
Potassium: 4.1 mmol/L (ref 3.5–5.1)
Sodium: 142 mmol/L (ref 135–145)

## 2019-05-01 LAB — CBC
HCT: 39.5 % (ref 39.0–52.0)
Hemoglobin: 12.5 g/dL — ABNORMAL LOW (ref 13.0–17.0)
MCH: 31.7 pg (ref 26.0–34.0)
MCHC: 31.6 g/dL (ref 30.0–36.0)
MCV: 100.3 fL — ABNORMAL HIGH (ref 80.0–100.0)
Platelets: 228 10*3/uL (ref 150–400)
RBC: 3.94 MIL/uL — ABNORMAL LOW (ref 4.22–5.81)
RDW: 13.8 % (ref 11.5–15.5)
WBC: 9.1 10*3/uL (ref 4.0–10.5)
nRBC: 0 % (ref 0.0–0.2)

## 2019-05-01 MED ORDER — DOCUSATE SODIUM 50 MG/5ML PO LIQD
100.0000 mg | Freq: Two times a day (BID) | ORAL | Status: DC
  Administered 2019-05-01 – 2019-05-02 (×2): 100 mg via ORAL
  Filled 2019-05-01 (×6): qty 10

## 2019-05-01 NOTE — Consult Note (Signed)
ORTHOPAEDIC CONSULTATION  REQUESTING PHYSICIAN: Shelly Coss, MD  Chief Complaint: Right femoral neck fx  HPI: Jeremy Kelley is a 86 y.o. male who presents with right femoral neck fx s/p mechanical fall.  Underwent left partial hip replacement 5 weeks for femoral neck fx.    Past Medical History:  Diagnosis Date  . Arthritis   . Carotid artery stenosis 11/2008   bilateral 40-59% stenosis  . Colon polyps   . Diverticulosis   . Hepatitis    doesn't know which type 1969  . Hyperlipidemia   . Hypertension   . Numbness    fingertips  . Phimosis   . Prostate cancer ([place]) 2001  . Urothelial carcinoma of left distal ureter [facility])    Past Surgical History:  Procedure Laterality Date  . BIOPSY  10/01/2011   Procedure: BIOPSY;  Surgeon: [name], MD;  [facility];  Service: Urology;;  biopsy of bladder tumor  . BLADDER SUSPENSION  08/18/10   [name]  . CARDIAC CATHETERIZATION N/A 07/29/2015   Procedure: Right/Left Heart Cath and Coronary Angiography;  Surgeon: Peter M Martinique, MD;  Location: [place] CV LAB;  Service: Cardiovascular;  Laterality: N/A;  . CATARACT EXTRACTION W/ INTRAOCULAR LENS  IMPLANT, BILATERAL Bilateral   . CYSTOSCOPY W/ RETROGRADES  01/27/2012   Procedure: CYSTOSCOPY WITH RETROGRADE PYELOGRAM;  Surgeon: [name], MD;  [facility];  Service: Urology;  Laterality: Left;  . CYSTOSCOPY WITH RETROGRADE PYELOGRAM, URETEROSCOPY AND STENT PLACEMENT Left 11/24/2012   Procedure: CYSTOSCOPY WITH left RETROGRADE PYELOGRAM, bladder washings, ureteroscopy;  Surgeon: [name], MD;  [facility];  Service: Urology;  Laterality: Left;  . CYSTOSCOPY WITH RETROGRADE PYELOGRAM, URETEROSCOPY AND STENT PLACEMENT Bilateral 11/19/2013   Procedure: CYSTOSCOPY WITH BILATERAL RETROGRADE PYELOGRAM,;  Surgeon: [name], MD;  [facility];  Service: Urology;  Laterality: Bilateral;  . EYE SURGERY  09/24/10   left eye. "Retina surgery"  . HIP ARTHROPLASTY  Left 03/26/2019   Procedure: ARTHROPLASTY BIPOLAR HIP (HEMIARTHROPLASTY);  Surgeon: Marybelle Killings, MD;  [facility];  Service: Orthopedics;  Laterality: Left;  Depuy press fit, lateral position  . INGUINAL HERNIA REPAIR  1960  . PROSTATE SURGERY     s/p laparoscopic prostate surgery  . SKIN BIOPSY  05/20/2017   Superficial and nodular basal cell carcinoma (left temple)    basal cell carcinoma with sclerosis (right temple)  . 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 SKIN CANCER EXCISION     a/p skin cancer right foot-non melanoma  . skin laceration    . TEE WITHOUT CARDIOVERSION N/A 07/29/2015   Procedure: TRANSESOPHAGEAL ECHOCARDIOGRAM (TEE);  Surgeon: Pixie Casino, MD;  Location: [facility] ENDOSCOPY;  Service: Cardiovascular;  Laterality: N/A;  cath to follow  . TRANSURETHRAL RESECTION OF BLADDER TUMOR  10/01/2011   Procedure: TRANSURETHRAL RESECTION OF BLADDER TUMOR (TURBT);  Surgeon: [name], MD;  [facility];  Service: Urology;  Laterality: N/A;  . TRANSURETHRAL RESECTION OF BLADDER TUMOR N/A 11/19/2013   Procedure: TRANSURETHRAL RESECTION OF BLADDER TUMOR (TURBT);  Surgeon: [name], MD;  [facility];  Service: Urology;  Laterality: N/A;  . URETEROSCOPY  01/27/2012   Procedure: URETEROSCOPY;  Surgeon: [name], MD;  [facility];  Service: Urology;  Laterality: Left;  CYSTO, Left RETROGRADE PYELOGRPHY, LEFT URETEROSCOPY   . URINARY SPHINCTER IMPLANT    . VARICOSE VEIN SURGERY     Social History  Socioeconomic History  . Marital status: Single    Spouse name: Not on file  . Number of children: 0  . Years of education: Not on file  . Highest education level: Not on file  Occupational History  . Occupation: Retired  Tobacco Use  . Smoking status: Never Smoker  . Smokeless tobacco: Never Used  . Tobacco comment: 07/29/2015 "might smoke a cigar once/year"  Substance and Sexual Activity  . Alcohol use:  Yes    Alcohol/week: 14.0 standard drinks    Types: 14 Glasses of wine per week    Comment: 07/29/2015 "big glass of wine q night"  . Drug use: No  . Sexual activity: Not on file  Other Topics Concern  . Not on file  Social History Narrative   Patient is single, has never been married.  Does not have any children.  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   He drinks approximately 3-4 glasses of wine per week.  No history of excessive alcohol use.  Occasionally smokes a cigar.   Spends winters in [place]        Social Determinants of Health   Financial Resource Strain:   . Difficulty of Paying Living Expenses: Not on file  Food Insecurity:   . Worried About Charity fundraiser in the Last Year: Not on file  . Ran Out of Food in the Last Year: Not on file  Transportation Needs:   . Lack of Transportation (Medical): Not on file  . Lack of Transportation (Non-Medical): Not on file  Physical Activity:   . Days of Exercise per Week: Not on file  . Minutes of Exercise per Session: Not on file  Stress:   . Feeling of Stress : Not on file  Social Connections:   . Frequency of Communication with Friends and Family: Not on file  . Frequency of Social Gatherings with Friends and Family: Not on file  . Attends Religious Services: Not on file  . Active Member of Clubs or Organizations: Not on file  . Attends Archivist Meetings: Not on file  . Marital Status: Not on file   Family History  Problem Relation Age of Onset  . Colon cancer Neg Hx   . Colon polyps Neg Hx    - negative except otherwise stated in the family history section No Known Allergies Prior to Admission medications   Medication Sig Start Date End Date Taking? Authorizing Provider  acetaminophen (TYLENOL) 325 MG tablet Take 1-2 tablets (325-650 mg total) by mouth every 6 (six) hours as needed for mild pain or moderate pain (pain score 1-3 or temp > 100.5). Patient taking differently: Take 650  mg by mouth every 6 (six) hours as needed for mild pain or moderate pain (pain score 1-3 or temp > 100.5).  03/30/19  Yes Lucky Cowboy, MD  apixaban (ELIQUIS) 2.5 MG TABS tablet Take 1 tablet (2.5 mg total) by mouth 2 (two) times daily. 04/21/19  Yes Hennie Duos, MD  B Complex-C (B-COMPLEX WITH VITAMIN C) tablet Take 1 tablet by mouth daily. Reported on 07/24/2015 08/18/16  Yes Nafziger, [name], NP  docusate sodium (COLACE) 100 MG capsule Take 1 capsule (100 mg total) by mouth 2 (two) times daily. 02/24/19  Yes Shawna Clamp, MD  donepezil (ARICEPT) 5 MG tablet One tablet daily Patient taking differently: Take 5 mg by mouth at bedtime. One tablet daily 04/21/19  Yes Hennie Duos, MD  fluorometholone (FML) 0.1 %  ophthalmic suspension Place 1 drop into both eyes 4 (four) times daily. 04/21/19  Yes Hennie Duos, MD  fluticasone [facility]) 50 MCG/ACT nasal spray Place 2 sprays into both nostrils at bedtime. 04/21/19  Yes Hennie Duos, MD  ipratropium (ATROVENT) 0.06 % nasal spray Place 2 sprays into the nose 2 (two) times daily as needed. Nasal drainage Patient taking differently: Place 2 sprays into the nose 2 (two) times daily as needed (allergies). Nasal drainage 04/21/19  Yes Hennie Duos, MD  metoprolol succinate (TOPROL-XL) 25 MG 24 hr tablet TAKE 1 TABLET BY MOUTH EVERYDAY AT BEDTIME Patient taking differently: Take 25 mg by mouth at bedtime. TAKE 1 TABLET BY MOUTH EVERYDAY AT BEDTIME 04/24/19  Yes Nafziger, [name], NP  mirabegron ER (MYRBETRIQ) 50 MG TB24 tablet Take 1 tablet (50 mg total) by mouth daily. 04/21/19  Yes Hennie Duos, MD  Nutritional Supplement LIQD Take 120 mLs by mouth daily. [employer]Pass   Yes [provider]  phosphorus (K PHOS NEUTRAL) 155-852-130 MG tablet Take 2 tablets (500 mg total) by mouth 2 (two) times daily. 04/21/19  Yes Hennie Duos, MD  potassium chloride (MICRO-K) 10 MEQ CR capsule Take 1 capsule (10 mEq total) by mouth daily. 04/21/19  Yes  Hennie Duos, MD  pravastatin (PRAVACHOL) 20 MG tablet TAKE 1 TABLET BY MOUTH EVERYDAY AT BEDTIME 04/21/19  Yes Hennie Duos, MD  torsemide (DEMADEX) 20 MG tablet Take 1 tablet (20 mg total) by mouth daily. 04/21/19  Yes Hennie Duos, MD  XIIDRA 5 % SOLN Place 1 drop into both eyes 2 (two) times daily. 04/21/19  Yes Hennie Duos, MD  cholecalciferol (VITAMIN D) 1000 units tablet Take 1 tablet (1,000 Units total) by mouth daily. Patient not taking: Reported on 04/30/2019 08/18/16   [name], NP  polyethylene glycol (MIRALAX / GLYCOLAX) 17 g packet Take 17 g by mouth daily as needed for mild constipation.     [provider]   Chest Portable 1 View  Result Date: 04/30/2019 CLINICAL DATA:  Trauma secondary to a fall at home. EXAM: PORTABLE CHEST 1 VIEW COMPARISON:  Chest x-ray dated 03/25/2019 FINDINGS: There is marked chronic cardiomegaly with chronic enlargement of the main pulmonary arteries consistent with pulmonary arterial hypertension. There are no infiltrates. Chronic blunting of the right costophrenic angle laterally. No acute bone abnormality. IMPRESSION: No acute abnormality. Marked chronic cardiomegaly. Pulmonary arterial hypertension. Aortic Atherosclerosis (ICD10-I70.0). Electronically Signed   By: Lorriane Shire M.D.   On: 04/30/2019 14:39   DG Hip Unilat With Pelvis 2-3 Views Right  Result Date: 04/30/2019 CLINICAL DATA:  [place]. Right hip pain. EXAM: DG HIP (WITH OR WITHOUT PELVIS) 2-3V RIGHT COMPARISON:  04/10/2019 FINDINGS: Displaced right femoral neck fracture. Left hip prosthesis is intact. No complicating features. The visualized bony pelvis is intact. IMPRESSION: Displaced right femoral neck fracture. Electronically Signed   By: Marijo Sanes M.D.   On: 04/30/2019 09:59   - pertinent xrays, CT, MRI studies were reviewed and independently interpreted  Positive ROS: All other systems have been reviewed and were otherwise negative with the exception of those  mentioned in the HPI and as above.  Physical Exam: General: Alert, no acute distress Cardiovascular: No pedal edema Respiratory: No cyanosis, no use of accessory musculature GI: No organomegaly, abdomen is soft and non-tender Skin: No lesions in the area of chief complaint Neurologic: Sensation intact distally Psychiatric: Patient is competent for consent with normal mood and affect Lymphatic: No  axillary or cervical lymphadenopathy  MUSCULOSKELETAL:  - pain with movement of RLE - NVI distally  Assessment: Right femoral neck fx  Plan: - discussed findings with patient and I have recommended partial hip replacement - patient agreeable to proceed - surgery scheduled for Thursday   Thank you for the consult and the opportunity to see Mr. Lanier Clam Eduard Roux, MD [facility] 10:23 AM

## 2019-05-01 NOTE — Plan of Care (Signed)

## 2019-05-01 NOTE — Progress Notes (Signed)
PROGRESS NOTE    Jeremy Kelley  [id number] DOB: 1927/03/20 DOA: 04/30/2019 PCP: [name], NP   Brief Narrative:  Patient is a 86 year old male with history of hypertension, hyperlipidemia, Chronic diastolic CHF, pulmonary hypertension, chronic A. fib on anticoagulation, chronic carotid artery stenosis bilaterally, chronic back pain, urethral carcinoma, prostate cancer, recent closed fracture of left tibia status post hemiarthroplasty on 03/26/2019 who presents after having  fall at home.  X-ray of the right pelvis showed acute displaced humeral neck fracture.  Orthopedics consulted.  Planning for ORIF.  Assessment & Plan:   Principal Problem:   Displaced fracture of right femoral neck (HCC) Active Problems:   Hyperlipidemia   Essential hypertension   Atrial fibrillation, chronic (HCC)   Pulmonary hypertension (HCC)   CAD (coronary artery disease)   Dementia ([place])   DNR (do not resuscitate)   Fall at home, initial encounter   Displaced right femoral fracture secondary to mechanical fall: Orthopedics following.  Continue pain management.  Planning for ORIF.  Surgery has been scheduled for Thursday.  Chronic A. fib: On Eliquis at home.  Eliquis on hold for surgery.  On metoprolol for rate control.  Continue Lovenox for DVT prophylaxis for now.  Mild dementia: On Aricept  Hypertension: Blood pressure currently stable.  Continue current regimen  Pulmonary hypertension with diastolic CHF: Last echocardiogram done in January 2020 showed preserved ejection fraction of 55 to 60%.  Appears euvolemic.  On torsemide 20 mg daily which we will continue  Hyperlipidemia: Continue statin  CODE STATUS: DNR         DVT prophylaxis:Lovenox Code Status: DNR Family Communication: None present at the bedside Disposition Plan: Likely skilled nursing facility after surgery   Consultants: Orthopedics  Procedures: None  Antimicrobials:  Anti-infectives (From admission,  onward)   None      Subjective: Patient seen and examined at the bedside this morning.  Hemodynamically stable.  Pain on the right hip is well controlled.  Denies any new complaints.  Objective: Vitals:   04/30/19 2239 05/01/19 0013 05/01/19 0456 05/01/19 0751  BP: (!) 142/65 120/65 139/66 122/64  Pulse: 83 67 71 65  Resp: 20 14 16 14   Temp: 98.4 F (36.9 C) 97.7 F (36.5 C) 97.8 F (36.6 C) 97.8 F (36.6 C)  TempSrc: Oral Oral Oral Oral  SpO2: 94% 99% 99% 100%  Weight: 57.1 kg     Height: 5\' 9"  (1.753 m)       Intake/Output Summary (Last 24 hours) at 05/01/2019 1247 Last data filed at 05/01/2019 0000 Gross per 24 hour  Intake 240 ml  Output 450 ml  Net -210 ml   Filed Weights   04/30/19 2239  Weight: 57.1 kg    Examination:  General exam: Very pleasant, elderly deconditioned gentleman HEENT:PERRL,Oral mucosa moist, Ear/Nose normal on gross exam Respiratory system: Bilateral equal air entry, normal vesicular breath sounds, no wheezes or crackles  Cardiovascular system: A. Fib, no JVD, murmurs, rubs, gallops or clicks. No pedal edema. Gastrointestinal system: Abdomen is nondistended, soft and nontender. No organomegaly or masses felt. Normal bowel sounds heard. Central nervous system: Alert and oriented. No focal neurological deficits. Extremities: No edema, no clubbing ,no cyanosis, tenderness on the right hip, decreased range of motion of the right lower extremity skin: No rashes, lesions or ulcers,no icterus ,no pallor    Data Reviewed: I have personally reviewed following labs and imaging studies  CBC: Recent Labs  Lab 04/26/19 1128 04/30/19 1015 05/01/19 0527  WBC 10.3  10.2 9.1  NEUTROABS 8.7* 8.9*  --   HGB 13.1 13.0 12.5*  HCT 39.8 40.9 39.5  MCV 96.1 100.0 100.3*  PLT 243.0 262 [id number]   Basic Metabolic Panel: Recent Labs  Lab 04/26/19 1128 04/30/19 1015 05/01/19 0527  NA 143 141 142  K 4.5 4.5 4.1  CL 104 105 106  CO2 32 27 28  GLUCOSE 94  111* 100*  BUN 18 20 20   CREATININE 1.00 0.83 1.08  CALCIUM 9.8 10.1 9.6   GFR: Estimated Creatinine Clearance: 36.7 mL/min (by C-G formula based on SCr of 1.08 mg/dL). Liver Function Tests: No results for input(s): AST, ALT, ALKPHOS, BILITOT, PROT, ALBUMIN in the last 168 hours. No results for input(s): LIPASE, AMYLASE in the last 168 hours. No results for input(s): AMMONIA in the last 168 hours. Coagulation Profile: Recent Labs  Lab 04/30/19 1015  INR 1.3*   Cardiac Enzymes: No results for input(s): CKTOTAL, CKMB, CKMBINDEX, TROPONINI in the last 168 hours. BNP (last 3 results) Recent Labs    08/31/18 1247  PROBNP 459.0*   HbA1C: No results for input(s): HGBA1C in the last 72 hours. CBG: No results for input(s): GLUCAP in the last 168 hours. Lipid Profile: No results for input(s): CHOL, HDL, LDLCALC, TRIG, CHOLHDL, LDLDIRECT in the last 72 hours. Thyroid Function Tests: No results for input(s): TSH, T4TOTAL, FREET4, T3FREE, THYROIDAB in the last 72 hours. Anemia Panel: No results for input(s): VITAMINB12, FOLATE, FERRITIN, TIBC, IRON, RETICCTPCT in the last 72 hours. Sepsis Labs: No results for input(s): PROCALCITON, LATICACIDVEN in the last 168 hours.  Recent Results (from the past 240 hour(s))  Respiratory Panel by RT PCR (Flu A&B, Covid) - Nasopharyngeal Swab     Status: None   Collection Time: 04/30/19 10:13 AM   Specimen: Nasopharyngeal Swab  Result Value Ref Range Status   SARS Coronavirus 2 by RT PCR NEGATIVE NEGATIVE Final    Comment: (NOTE) SARS-CoV-2 target nucleic acids are NOT DETECTED. The SARS-CoV-2 RNA is generally detectable in upper respiratoy specimens during the acute phase of infection. The lowest concentration of SARS-CoV-2 viral copies this assay can detect is 131 copies/mL. A negative result does not preclude SARS-Cov-2 infection and should not be used as the sole basis for treatment or other patient management decisions. A negative result  may occur with  improper specimen collection/handling, submission of specimen other than nasopharyngeal swab, presence of viral mutation(s) within the areas targeted by this assay, and inadequate number of viral copies (<131 copies/mL). A negative result must be combined with clinical observations, patient history, and epidemiological information. The expected result is Negative. Fact Sheet for Patients:  PinkCheek.be Fact Sheet for Healthcare Providers:  GravelBags.it This test is not yet ap proved or cleared by the [place] FDA and  has been authorized for detection and/or diagnosis of SARS-CoV-2 by FDA under an Emergency Use Authorization (EUA). This EUA will remain  in effect (meaning this test can be used) for the duration of the COVID-19 declaration under Section 564(b)(1) of the Act, 21 U.S.C. section 360bbb-3(b)(1), unless the authorization is terminated or revoked sooner.    Influenza A by PCR NEGATIVE NEGATIVE Final   Influenza B by PCR NEGATIVE NEGATIVE Final    Comment: (NOTE) The Xpert Xpress SARS-CoV-2/FLU/RSV assay is intended as an aid in  the diagnosis of influenza from Nasopharyngeal swab specimens and  should not be used as a sole basis for treatment. Nasal washings and  aspirates are unacceptable for Xpert Xpress SARS-CoV-2/FLU/RSV  testing. Fact Sheet for Patients: PinkCheek.be Fact Sheet for Healthcare Providers: GravelBags.it This test is not yet approved or cleared by the [place] FDA and  has been authorized for detection and/or diagnosis of SARS-CoV-2 by  FDA under an Emergency Use Authorization (EUA). This EUA will remain  in effect (meaning this test can be used) for the duration of the  Covid-19 declaration under Section 564(b)(1) of the Act, 21  U.S.C. section 360bbb-3(b)(1), unless the authorization is  terminated or  revoked. Performed at [facility], [place] [address] [place], [place] [address]          Radiology Studies: Chest Portable 1 View  Result Date: 04/30/2019 CLINICAL DATA:  Trauma secondary to a fall at home. EXAM: PORTABLE CHEST 1 VIEW COMPARISON:  Chest x-ray dated 03/25/2019 FINDINGS: There is marked chronic cardiomegaly with chronic enlargement of the main pulmonary arteries consistent with pulmonary arterial hypertension. There are no infiltrates. Chronic blunting of the right costophrenic angle laterally. No acute bone abnormality. IMPRESSION: No acute abnormality. Marked chronic cardiomegaly. Pulmonary arterial hypertension. Aortic Atherosclerosis (ICD10-I70.0). Electronically Signed   By: Lorriane Shire M.D.   On: 04/30/2019 14:39   DG Hip Unilat With Pelvis 2-3 Views Right  Result Date: 04/30/2019 CLINICAL DATA:  [place]. Right hip pain. EXAM: DG HIP (WITH OR WITHOUT PELVIS) 2-3V RIGHT COMPARISON:  04/10/2019 FINDINGS: Displaced right femoral neck fracture. Left hip prosthesis is intact. No complicating features. The visualized bony pelvis is intact. IMPRESSION: Displaced right femoral neck fracture. Electronically Signed   By: Marijo Sanes M.D.   On: 04/30/2019 09:59        Scheduled Meds: . docusate  100 mg Oral BID  . donepezil  5 mg Oral QHS  . enoxaparin (LOVENOX) injection  40 mg Subcutaneous Q24H  . fluorometholone  1 drop Both Eyes QID  . fluticasone  2 spray Each Nare QHS  . Lifitegrast  1 drop Both Eyes BID  . metoprolol succinate  25 mg Oral QHS  . mirabegron ER  50 mg Oral Daily  . phosphorus  500 mg Oral BID  . potassium chloride  10 mEq Oral Daily  . pravastatin  20 mg Oral q1800  . senna-docusate  1 tablet Oral QHS  . torsemide  20 mg Oral Daily   Continuous Infusions:   LOS: 1 day    Time spent: 25 mins.More than 50% of that time was spent in counseling and/or coordination of care.      Shelly Coss, MD [facility] Pager [phone]  If 7PM-7AM, please contact night-coverage [email or web address] Password [facility] 05/01/2019, 12:47 PM

## 2019-05-02 DIAGNOSIS — L8990 Pressure ulcer of unspecified site, unspecified stage: Secondary | ICD-10-CM | POA: Insufficient documentation

## 2019-05-02 HISTORY — DX: Pressure ulcer of unspecified site, unspecified stage: L89.90

## 2019-05-02 LAB — CBC WITH DIFFERENTIAL/PLATELET
Abs Immature Granulocytes: 0.04 10*3/uL (ref 0.00–0.07)
Basophils Absolute: 0 10*3/uL (ref 0.0–0.1)
Basophils Relative: 0 %
Eosinophils Absolute: 0.2 10*3/uL (ref 0.0–0.5)
Eosinophils Relative: 2 %
HCT: 37.1 % — ABNORMAL LOW (ref 39.0–52.0)
Hemoglobin: 11.7 g/dL — ABNORMAL LOW (ref 13.0–17.0)
Immature Granulocytes: 1 %
Lymphocytes Relative: 10 %
Lymphs Abs: 0.9 10*3/uL (ref 0.7–4.0)
MCH: 31.6 pg (ref 26.0–34.0)
MCHC: 31.5 g/dL (ref 30.0–36.0)
MCV: 100.3 fL — ABNORMAL HIGH (ref 80.0–100.0)
Monocytes Absolute: 0.6 10*3/uL (ref 0.1–1.0)
Monocytes Relative: 7 %
Neutro Abs: 7 10*3/uL (ref 1.7–7.7)
Neutrophils Relative %: 80 %
Platelets: 222 10*3/uL (ref 150–400)
RBC: 3.7 MIL/uL — ABNORMAL LOW (ref 4.22–5.81)
RDW: 13.8 % (ref 11.5–15.5)
WBC: 8.8 10*3/uL (ref 4.0–10.5)
nRBC: 0 % (ref 0.0–0.2)

## 2019-05-02 MED ORDER — ENSURE PRE-SURGERY PO LIQD
296.0000 mL | Freq: Once | ORAL | Status: AC
  Administered 2019-05-02: 296 mL via ORAL
  Filled 2019-05-02: qty 296

## 2019-05-02 MED ORDER — TRANEXAMIC ACID 1000 MG/10ML IV SOLN
2000.0000 mg | INTRAVENOUS | Status: AC
  Filled 2019-05-02: qty 20

## 2019-05-02 MED ORDER — CEFAZOLIN SODIUM-DEXTROSE 2-4 GM/100ML-% IV SOLN
2.0000 g | INTRAVENOUS | Status: AC
  Administered 2019-05-03: 2 g via INTRAVENOUS
  Filled 2019-05-02: qty 100

## 2019-05-02 NOTE — Progress Notes (Signed)
PROGRESS NOTE    Jeremy Kelley  [id number] DOB: [birth year] DOA: 04/30/2019 PCP: [name], NP   Brief Narrative:  Patient is a 86 year old male with history of hypertension, hyperlipidemia, Chronic diastolic CHF, pulmonary hypertension, chronic A. fib on anticoagulation, chronic carotid artery stenosis bilaterally, chronic back pain, urethral carcinoma, prostate cancer, recent closed fracture of left tibia status post hemiarthroplasty on 03/26/2019 who presents after having  fall at home.  X-ray of the right pelvis showed acute displaced humeral neck fracture.  Orthopedics consulted.  Planning for ORIF tomorrow.  Assessment & Plan:   Principal Problem:   Displaced fracture of right femoral neck (HCC) Active Problems:   Hyperlipidemia   Essential hypertension   Atrial fibrillation, chronic (HCC)   Pulmonary hypertension (HCC)   CAD (coronary artery disease)   Dementia ([place])   DNR (do not resuscitate)   Fall at home, initial encounter   Displaced right femoral fracture secondary to mechanical fall: Orthopedics following.  Continue pain management.  Planning for ORIF.  Surgery has been scheduled for Thursday.  Chronic A. fib: On Eliquis at home.  Eliquis on hold for surgery.  On metoprolol for rate control.  Continue Lovenox for DVT prophylaxis for now.  Mild dementia: On Aricept  Hypertension: Blood pressure currently stable.  Continue current regimen  Pulmonary hypertension with diastolic CHF: Last echocardiogram done in January 2020 showed preserved ejection fraction of 55 to 60%.  Appears euvolemic.  On torsemide 20 mg daily which we will continue  Hyperlipidemia: Continue statin  CODE STATUS: DNR         DVT prophylaxis:Lovenox Code Status: DNR Family Communication: POA on 05/02/19 Disposition Plan: Likely skilled nursing facility after surgery   Consultants: Orthopedics  Procedures: None  Antimicrobials:  Anti-infectives (From admission, onward)     None      Subjective: Patient seen and examined the bedside this morning.  Hemodynamically stable.  Comfortable.  Pain well controlled.  Complains of some problem with his dentures and asking for toothbrush.  Objective: Vitals:   05/01/19 0751 05/01/19 1559 05/01/19 2111 05/02/19 0657  BP: 122/64 123/63 115/60 (!) 125/57  Pulse: 65 64 67 69  Resp: 14 16 18 17   Temp: 97.8 F (36.6 C) 98.5 F (36.9 C) 97.8 F (36.6 C) (!) 97.5 F (36.4 C)  TempSrc: Oral  Oral   SpO2: 100% 100% 98% 100%  Weight:      Height:        Intake/Output Summary (Last 24 hours) at 05/02/2019 1258 Last data filed at 05/01/2019 1329 Gross per 24 hour  Intake 240 ml  Output 800 ml  Net -560 ml   Filed Weights   04/30/19 2239  Weight: 57.1 kg    Examination:   General exam: Pleasant elderly gentleman, deconditioned, debilitated Respiratory system: Bilaterally clear  cardiovascular system:Afib Gastrointestinal system: Abdomen is nondistended, soft and nontender. No organomegaly or masses felt. Normal bowel sounds heard. Central nervous system: Alert and oriented. No focal neurological deficits. Extremities: No edema, no clubbing ,no cyanosis, tenderness on the right hip, decreased range of motion of the right lower extremity  skin: No rashes, lesions or ulcers,no icterus ,no pallor   Data Reviewed: I have personally reviewed following labs and imaging studies  CBC: Recent Labs  Lab 04/26/19 1128 04/30/19 1015 05/01/19 0527 05/02/19 0509  WBC 10.3 10.2 9.1 8.8  NEUTROABS 8.7* 8.9*  --  7.0  HGB 13.1 13.0 12.5* 11.7*  HCT 39.8 40.9 39.5 37.1*  MCV 96.1 100.0  100.3* 100.3*  PLT 243.0 262 228 [id number]   Basic Metabolic Panel: Recent Labs  Lab 04/26/19 1128 04/30/19 1015 05/01/19 0527  NA 143 141 142  K 4.5 4.5 4.1  CL 104 105 106  CO2 32 27 28  GLUCOSE 94 111* 100*  BUN 18 20 20   CREATININE 1.00 0.83 1.08  CALCIUM 9.8 10.1 9.6   GFR: Estimated Creatinine Clearance: 36.7 mL/min  (by C-G formula based on SCr of 1.08 mg/dL). Liver Function Tests: No results for input(s): AST, ALT, ALKPHOS, BILITOT, PROT, ALBUMIN in the last 168 hours. No results for input(s): LIPASE, AMYLASE in the last 168 hours. No results for input(s): AMMONIA in the last 168 hours. Coagulation Profile: Recent Labs  Lab 04/30/19 1015  INR 1.3*   Cardiac Enzymes: No results for input(s): CKTOTAL, CKMB, CKMBINDEX, TROPONINI in the last 168 hours. BNP (last 3 results) Recent Labs    08/31/18 1247  PROBNP 459.0*   HbA1C: No results for input(s): HGBA1C in the last 72 hours. CBG: No results for input(s): GLUCAP in the last 168 hours. Lipid Profile: No results for input(s): CHOL, HDL, LDLCALC, TRIG, CHOLHDL, LDLDIRECT in the last 72 hours. Thyroid Function Tests: No results for input(s): TSH, T4TOTAL, FREET4, T3FREE, THYROIDAB in the last 72 hours. Anemia Panel: No results for input(s): VITAMINB12, FOLATE, FERRITIN, TIBC, IRON, RETICCTPCT in the last 72 hours. Sepsis Labs: No results for input(s): PROCALCITON, LATICACIDVEN in the last 168 hours.  Recent Results (from the past 240 hour(s))  Respiratory Panel by RT PCR (Flu A&B, Covid) - Nasopharyngeal Swab     Status: None   Collection Time: 04/30/19 10:13 AM   Specimen: Nasopharyngeal Swab  Result Value Ref Range Status   SARS Coronavirus 2 by RT PCR NEGATIVE NEGATIVE Final    Comment: (NOTE) SARS-CoV-2 target nucleic acids are NOT DETECTED. The SARS-CoV-2 RNA is generally detectable in upper respiratoy specimens during the acute phase of infection. The lowest concentration of SARS-CoV-2 viral copies this assay can detect is 131 copies/mL. A negative result does not preclude SARS-Cov-2 infection and should not be used as the sole basis for treatment or other patient management decisions. A negative result may occur with  improper specimen collection/handling, submission of specimen other than nasopharyngeal swab, presence of viral  mutation(s) within the areas targeted by this assay, and inadequate number of viral copies (<131 copies/mL). A negative result must be combined with clinical observations, patient history, and epidemiological information. The expected result is Negative. Fact Sheet for Patients:  PinkCheek.be Fact Sheet for Healthcare Providers:  GravelBags.it This test is not yet ap proved or cleared by the [place] FDA and  has been authorized for detection and/or diagnosis of SARS-CoV-2 by FDA under an Emergency Use Authorization (EUA). This EUA will remain  in effect (meaning this test can be used) for the duration of the COVID-19 declaration under Section 564(b)(1) of the Act, 21 U.S.C. section 360bbb-3(b)(1), unless the authorization is terminated or revoked sooner.    Influenza A by PCR NEGATIVE NEGATIVE Final   Influenza B by PCR NEGATIVE NEGATIVE Final    Comment: (NOTE) The Xpert Xpress SARS-CoV-2/FLU/RSV assay is intended as an aid in  the diagnosis of influenza from Nasopharyngeal swab specimens and  should not be used as a sole basis for treatment. Nasal washings and  aspirates are unacceptable for Xpert Xpress SARS-CoV-2/FLU/RSV  testing. Fact Sheet for Patients: PinkCheek.be Fact Sheet for Healthcare Providers: GravelBags.it This test is not yet approved or cleared by  the [employer] and  has been authorized for detection and/or diagnosis of SARS-CoV-2 by  FDA under an Emergency Use Authorization (EUA). This EUA will remain  in effect (meaning this test can be used) for the duration of the  Covid-19 declaration under Section 564(b)(1) of the Act, 21  U.S.C. section 360bbb-3(b)(1), unless the authorization is  terminated or revoked. Performed at [facility], [place] [address] [place], [place] [address]          Radiology  Studies: Chest Portable 1 View  Result Date: 04/30/2019 CLINICAL DATA:  Trauma secondary to a fall at home. EXAM: PORTABLE CHEST 1 VIEW COMPARISON:  Chest x-ray dated 03/25/2019 FINDINGS: There is marked chronic cardiomegaly with chronic enlargement of the main pulmonary arteries consistent with pulmonary arterial hypertension. There are no infiltrates. Chronic blunting of the right costophrenic angle laterally. No acute bone abnormality. IMPRESSION: No acute abnormality. Marked chronic cardiomegaly. Pulmonary arterial hypertension. Aortic Atherosclerosis (ICD10-I70.0). Electronically Signed   By: Lorriane Shire M.D.   On: 04/30/2019 14:39        Scheduled Meds: . docusate  100 mg Oral BID  . donepezil  5 mg Oral QHS  . enoxaparin (LOVENOX) injection  40 mg Subcutaneous Q24H  . fluorometholone  1 drop Both Eyes QID  . fluticasone  2 spray Each Nare QHS  . Lifitegrast  1 drop Both Eyes BID  . metoprolol succinate  25 mg Oral QHS  . mirabegron ER  50 mg Oral Daily  . phosphorus  500 mg Oral BID  . potassium chloride  10 mEq Oral Daily  . pravastatin  20 mg Oral q1800  . senna-docusate  1 tablet Oral QHS  . torsemide  20 mg Oral Daily  . [START ON 05/03/2019] tranexamic acid (CYKLOKAPRON) topical -INTRAOP  2,000 mg Topical To OR   Continuous Infusions:   LOS: 2 days    Time spent: 25 mins.More than 50% of that time was spent in counseling and/or coordination of care.      Shelly Coss, MD [facility] Pager [phone]  If 7PM-7AM, please contact night-coverage [email or web address] Password [facility] 05/02/2019, 12:58 PM

## 2019-05-03 ENCOUNTER — Inpatient Hospital Stay (HOSPITAL_COMMUNITY): Payer: Medicare Other

## 2019-05-03 ENCOUNTER — Ambulatory Visit: Payer: Self-pay

## 2019-05-03 ENCOUNTER — Inpatient Hospital Stay (HOSPITAL_COMMUNITY): Payer: Medicare Other | Admitting: Physician Assistant

## 2019-05-03 ENCOUNTER — Encounter (HOSPITAL_COMMUNITY): Payer: Self-pay | Admitting: Internal Medicine

## 2019-05-03 ENCOUNTER — Encounter (HOSPITAL_COMMUNITY)
Admission: EM | Disposition: A | Discharge status: Skilled Nursing Facility | Payer: Self-pay | Source: Home / Self Care | Attending: Internal Medicine

## 2019-05-03 ENCOUNTER — Ambulatory Visit: Payer: Medicare Other | Admitting: Adult Health

## 2019-05-03 DIAGNOSIS — I2510 Atherosclerotic heart disease of native coronary artery without angina pectoris: Secondary | ICD-10-CM | POA: Diagnosis not present

## 2019-05-03 DIAGNOSIS — S72041A Displaced fracture of base of neck of right femur, initial encounter for closed fracture: Secondary | ICD-10-CM | POA: Diagnosis not present

## 2019-05-03 DIAGNOSIS — I10 Essential (primary) hypertension: Secondary | ICD-10-CM | POA: Diagnosis not present

## 2019-05-03 HISTORY — PX: TOTAL HIP ARTHROPLASTY: SHX124

## 2019-05-03 LAB — URINALYSIS, COMPLETE (UACMP) WITH MICROSCOPIC
Bilirubin Urine: NEGATIVE
Glucose, UA: NEGATIVE mg/dL
Hgb urine dipstick: NEGATIVE
Ketones, ur: NEGATIVE mg/dL
Nitrite: NEGATIVE
Protein, ur: 30 mg/dL — AB
Specific Gravity, Urine: 1.017 (ref 1.005–1.030)
WBC, UA: >50 WBC/hpf — ABNORMAL HIGH (ref 0–5)
pH: 7 (ref 5.0–8.0)

## 2019-05-03 SURGERY — ARTHROPLASTY, HIP, TOTAL, ANTERIOR APPROACH
Anesthesia: General | Site: Hip | Laterality: Right

## 2019-05-03 MED ORDER — HYDROCODONE-ACETAMINOPHEN 5-325 MG PO TABS: 1.0000 | ORAL_TABLET | Freq: Three times a day (TID) | ORAL | 0 refills | Status: DC | PRN

## 2019-05-03 MED ORDER — FENTANYL CITRATE (PF) 100 MCG/2ML IJ SOLN
INTRAMUSCULAR | Status: AC
  Administered 2019-05-03: 50 ug via INTRAVENOUS
  Filled 2019-05-03: qty 2

## 2019-05-03 MED ORDER — 0.9 % SODIUM CHLORIDE (POUR BTL) OPTIME
TOPICAL | Status: DC | PRN
  Administered 2019-05-03: 1000 mL

## 2019-05-03 MED ORDER — TRANEXAMIC ACID 1000 MG/10ML IV SOLN
INTRAVENOUS | Status: DC | PRN
  Administered 2019-05-03: 2000 mg via TOPICAL

## 2019-05-03 MED ORDER — CEFAZOLIN SODIUM-DEXTROSE 2-4 GM/100ML-% IV SOLN: 2.0000 g | INTRAVENOUS | Status: DC

## 2019-05-03 MED ORDER — FENTANYL CITRATE (PF) 100 MCG/2ML IJ SOLN
INTRAMUSCULAR | Status: AC
  Administered 2019-05-03: 25 ug via INTRAVENOUS
  Filled 2019-05-03: qty 2

## 2019-05-03 MED ORDER — VANCOMYCIN HCL 1 G IV SOLR
INTRAVENOUS | Status: DC | PRN
  Administered 2019-05-03: 1000 mg

## 2019-05-03 MED ORDER — ONDANSETRON HCL 4 MG/2ML IJ SOLN
INTRAMUSCULAR | Status: AC
  Filled 2019-05-03: qty 2

## 2019-05-03 MED ORDER — FENTANYL CITRATE (PF) 100 MCG/2ML IJ SOLN
INTRAMUSCULAR | Status: DC | PRN
  Administered 2019-05-03 (×4): 25 ug via INTRAVENOUS

## 2019-05-03 MED ORDER — ALUM & MAG HYDROXIDE-SIMETH 200-200-20 MG/5ML PO SUSP: 30.0000 mL | ORAL | Status: DC | PRN

## 2019-05-03 MED ORDER — LIDOCAINE 2% (20 MG/ML) 5 ML SYRINGE
INTRAMUSCULAR | Status: DC | PRN
  Administered 2019-05-03: 60 mg via INTRAVENOUS

## 2019-05-03 MED ORDER — PHENYLEPHRINE 40 MCG/ML (10ML) SYRINGE FOR IV PUSH (FOR BLOOD PRESSURE SUPPORT)
PREFILLED_SYRINGE | INTRAVENOUS | Status: DC | PRN
  Administered 2019-05-03 (×2): 80 ug via INTRAVENOUS
  Administered 2019-05-03: 40 ug via INTRAVENOUS

## 2019-05-03 MED ORDER — ONDANSETRON HCL 4 MG/2ML IJ SOLN: 4.0000 mg | Freq: Four times a day (QID) | INTRAMUSCULAR | Status: DC | PRN

## 2019-05-03 MED ORDER — ONDANSETRON HCL 4 MG/2ML IJ SOLN: 4.0000 mg | Freq: Once | INTRAMUSCULAR | Status: DC | PRN

## 2019-05-03 MED ORDER — PHENOL 1.4 % MT LIQD
1.0000 | OROMUCOSAL | Status: DC | PRN
  Filled 2019-05-03: qty 177

## 2019-05-03 MED ORDER — FENTANYL CITRATE (PF) 100 MCG/2ML IJ SOLN
25.0000 ug | INTRAMUSCULAR | Status: DC | PRN
  Administered 2019-05-03 (×3): 25 ug via INTRAVENOUS

## 2019-05-03 MED ORDER — PROPOFOL 10 MG/ML IV BOLUS
INTRAVENOUS | Status: AC
  Filled 2019-05-03: qty 20

## 2019-05-03 MED ORDER — DEXAMETHASONE SODIUM PHOSPHATE 10 MG/ML IJ SOLN
INTRAMUSCULAR | Status: AC
  Filled 2019-05-03: qty 1

## 2019-05-03 MED ORDER — SORBITOL 70 % SOLN
30.0000 mL | Freq: Every day | Status: DC | PRN
  Administered 2019-05-07: 30 mL via ORAL
  Filled 2019-05-03 (×2): qty 30

## 2019-05-03 MED ORDER — FENTANYL CITRATE (PF) 100 MCG/2ML IJ SOLN
INTRAMUSCULAR | Status: AC
  Filled 2019-05-03: qty 2

## 2019-05-03 MED ORDER — ONDANSETRON HCL 4 MG PO TABS: 4.0000 mg | ORAL_TABLET | Freq: Four times a day (QID) | ORAL | Status: DC | PRN

## 2019-05-03 MED ORDER — POVIDONE-IODINE 10 % EX SWAB
2.0000 "application " | Freq: Once | CUTANEOUS | Status: AC
  Administered 2019-05-03: 2 via TOPICAL

## 2019-05-03 MED ORDER — OXYCODONE HCL 5 MG/5ML PO SOLN: 5.0000 mg | Freq: Once | ORAL | Status: DC | PRN

## 2019-05-03 MED ORDER — LIDOCAINE 2% (20 MG/ML) 5 ML SYRINGE
INTRAMUSCULAR | Status: AC
  Filled 2019-05-03: qty 5

## 2019-05-03 MED ORDER — APIXABAN 2.5 MG PO TABS
2.5000 mg | ORAL_TABLET | Freq: Two times a day (BID) | ORAL | Status: DC
  Administered 2019-05-04 – 2019-05-08 (×9): 2.5 mg via ORAL
  Filled 2019-05-03 (×9): qty 1

## 2019-05-03 MED ORDER — ONDANSETRON HCL 4 MG/2ML IJ SOLN
INTRAMUSCULAR | Status: DC | PRN
  Administered 2019-05-03: 4 mg via INTRAVENOUS

## 2019-05-03 MED ORDER — PROPOFOL 500 MG/50ML IV EMUL
INTRAVENOUS | Status: DC | PRN
  Administered 2019-05-03: 50 mg via INTRAVENOUS
  Administered 2019-05-03: 20 mg via INTRAVENOUS

## 2019-05-03 MED ORDER — SODIUM CHLORIDE 0.9 % IV SOLN: INTRAVENOUS | Status: DC

## 2019-05-03 MED ORDER — LACTATED RINGERS IV SOLN: INTRAVENOUS | Status: DC

## 2019-05-03 MED ORDER — HYDROCODONE-ACETAMINOPHEN 7.5-325 MG PO TABS: 1.0000 | ORAL_TABLET | ORAL | Status: DC | PRN

## 2019-05-03 MED ORDER — MEPERIDINE HCL 50 MG/ML IJ SOLN: 6.2500 mg | INTRAMUSCULAR | Status: DC | PRN

## 2019-05-03 MED ORDER — METHOCARBAMOL 500 MG PO TABS
500.0000 mg | ORAL_TABLET | Freq: Four times a day (QID) | ORAL | Status: DC | PRN
  Administered 2019-05-04 – 2019-05-05 (×2): 500 mg via ORAL
  Filled 2019-05-03 (×2): qty 1

## 2019-05-03 MED ORDER — METHOCARBAMOL 500 MG IVPB - SIMPLE MED
500.0000 mg | Freq: Four times a day (QID) | INTRAVENOUS | Status: DC | PRN
  Filled 2019-05-03: qty 50

## 2019-05-03 MED ORDER — CEFAZOLIN SODIUM-DEXTROSE 2-4 GM/100ML-% IV SOLN
2.0000 g | Freq: Four times a day (QID) | INTRAVENOUS | Status: AC
  Administered 2019-05-03 – 2019-05-04 (×3): 2 g via INTRAVENOUS
  Filled 2019-05-03 (×3): qty 100

## 2019-05-03 MED ORDER — ACETAMINOPHEN 10 MG/ML IV SOLN
1000.0000 mg | Freq: Once | INTRAVENOUS | Status: AC
  Administered 2019-05-03: 1000 mg via INTRAVENOUS

## 2019-05-03 MED ORDER — ACETAMINOPHEN 10 MG/ML IV SOLN
INTRAVENOUS | Status: AC
  Filled 2019-05-03: qty 100

## 2019-05-03 MED ORDER — ACETAMINOPHEN 160 MG/5ML PO SOLN: 325.0000 mg | ORAL | Status: DC | PRN

## 2019-05-03 MED ORDER — ISOPROPYL ALCOHOL 70 % SOLN
Status: DC | PRN
  Administered 2019-05-03: 1 via TOPICAL

## 2019-05-03 MED ORDER — DEXAMETHASONE SODIUM PHOSPHATE 10 MG/ML IJ SOLN
INTRAMUSCULAR | Status: DC | PRN
  Administered 2019-05-03: 10 mg via INTRAVENOUS

## 2019-05-03 MED ORDER — MORPHINE SULFATE (PF) 2 MG/ML IV SOLN: 1.0000 mg | INTRAVENOUS | Status: DC | PRN

## 2019-05-03 MED ORDER — VANCOMYCIN HCL 1000 MG IV SOLR
INTRAVENOUS | Status: AC
  Filled 2019-05-03: qty 1000

## 2019-05-03 MED ORDER — ACETAMINOPHEN 500 MG PO TABS
500.0000 mg | ORAL_TABLET | Freq: Four times a day (QID) | ORAL | Status: AC
  Administered 2019-05-04: 500 mg via ORAL
  Filled 2019-05-03 (×2): qty 1

## 2019-05-03 MED ORDER — EPHEDRINE SULFATE-NACL 50-0.9 MG/10ML-% IV SOSY
PREFILLED_SYRINGE | INTRAVENOUS | Status: DC | PRN
  Administered 2019-05-03: 10 mg via INTRAVENOUS

## 2019-05-03 MED ORDER — ISOPROPYL ALCOHOL 70 % SOLN
Status: AC
  Filled 2019-05-03: qty 480

## 2019-05-03 MED ORDER — MAGNESIUM CITRATE PO SOLN: 1.0000 | Freq: Once | ORAL | Status: DC | PRN

## 2019-05-03 MED ORDER — MENTHOL 3 MG MT LOZG: 1.0000 | LOZENGE | OROMUCOSAL | Status: DC | PRN

## 2019-05-03 MED ORDER — ACETAMINOPHEN 325 MG PO TABS: 325.0000 mg | ORAL_TABLET | ORAL | Status: DC | PRN

## 2019-05-03 MED ORDER — DOCUSATE SODIUM 100 MG PO CAPS
100.0000 mg | ORAL_CAPSULE | Freq: Two times a day (BID) | ORAL | Status: DC
  Administered 2019-05-04 – 2019-05-08 (×8): 100 mg via ORAL
  Filled 2019-05-03 (×9): qty 1

## 2019-05-03 MED ORDER — ACETAMINOPHEN 325 MG PO TABS: 325.0000 mg | ORAL_TABLET | Freq: Four times a day (QID) | ORAL | Status: DC | PRN

## 2019-05-03 MED ORDER — HYDROCODONE-ACETAMINOPHEN 5-325 MG PO TABS
1.0000 | ORAL_TABLET | ORAL | Status: DC | PRN
  Administered 2019-05-05: 1 via ORAL
  Filled 2019-05-03: qty 1

## 2019-05-03 MED ORDER — POLYETHYLENE GLYCOL 3350 17 G PO PACK
17.0000 g | PACK | Freq: Every day | ORAL | Status: DC | PRN
  Administered 2019-05-04: 17 g via ORAL
  Filled 2019-05-03: qty 1

## 2019-05-03 MED ORDER — CHLORHEXIDINE GLUCONATE CLOTH 2 % EX PADS
6.0000 | MEDICATED_PAD | Freq: Every day | CUTANEOUS | Status: DC
  Administered 2019-05-05 – 2019-05-07 (×3): 6 via TOPICAL

## 2019-05-03 MED ORDER — TRANEXAMIC ACID-NACL 1000-0.7 MG/100ML-% IV SOLN
1000.0000 mg | INTRAVENOUS | Status: AC
  Administered 2019-05-03: 13:00:00 1000 mg via INTRAVENOUS
  Filled 2019-05-03: qty 100

## 2019-05-03 MED ORDER — OXYCODONE HCL 5 MG PO TABS: 5.0000 mg | ORAL_TABLET | Freq: Once | ORAL | Status: DC | PRN

## 2019-05-03 MED ORDER — SODIUM CHLORIDE 0.9 % IR SOLN
Status: DC | PRN
  Administered 2019-05-03: 1000 mL

## 2019-05-03 SURGICAL SUPPLY — 60 items
ADH SKN CLS APL DERMABOND .7 (GAUZE/BANDAGES/DRESSINGS) ×1
BAG SPEC THK2 15X12 ZIP CLS (MISCELLANEOUS) ×1
BAG ZIPLOCK 12X15 (MISCELLANEOUS) ×3 IMPLANT
BIPOLAR DEPUY 51 (Hips) ×2 IMPLANT
BIPOLAR DEPUY 51MM (Hips) ×1 IMPLANT
CELLS DAT CNTRL 66122 CELL SVR (MISCELLANEOUS) ×1 IMPLANT
COVER PERINEAL POST (MISCELLANEOUS) ×3 IMPLANT
COVER SURGICAL LIGHT HANDLE (MISCELLANEOUS) ×3 IMPLANT
COVER WAND RF STERILE (DRAPES) IMPLANT
DERMABOND ADVANCED (GAUZE/BANDAGES/DRESSINGS) ×2
DERMABOND ADVANCED .7 DNX12 (GAUZE/BANDAGES/DRESSINGS) IMPLANT
DRAPE IMP U-DRAPE 54X76 (DRAPES) ×6 IMPLANT
DRAPE POUCH INSTRU U-SHP 10X18 (DRAPES) ×3 IMPLANT
DRAPE STERI IOBAN 125X83 (DRAPES) ×3 IMPLANT
DRAPE U-SHAPE 47X51 STRL (DRAPES) ×6 IMPLANT
DRESSING AQUACEL AG SP 3.5X6 (GAUZE/BANDAGES/DRESSINGS) IMPLANT
DRSG AQUACEL AG SP 3.5X6 (GAUZE/BANDAGES/DRESSINGS) ×3
DRSG MEPILEX BORDER 4X8 (GAUZE/BANDAGES/DRESSINGS) ×3 IMPLANT
DURAPREP 26ML APPLICATOR (WOUND CARE) ×3 IMPLANT
ELECT BLADE TIP CTD 4 INCH (ELECTRODE) ×6 IMPLANT
ELECT REM PT RETURN 15FT ADLT (MISCELLANEOUS) ×3 IMPLANT
GLOVE BIOGEL PI IND STRL 7.0 (GLOVE) ×1 IMPLANT
GLOVE BIOGEL PI INDICATOR 7.0 (GLOVE) ×2
GLOVE ECLIPSE 7.0 STRL STRAW (GLOVE) ×3 IMPLANT
GLOVE SKINSENSE NS SZ7.5 (GLOVE) ×2
GLOVE SKINSENSE STRL SZ7.5 (GLOVE) ×1 IMPLANT
GLOVE SURG SYN 7.5  E (GLOVE) ×8
GLOVE SURG SYN 7.5 E (GLOVE) ×4 IMPLANT
GLOVE SURG SYN 7.5 PF PI (GLOVE) ×4 IMPLANT
GOWN STRL REIN XL XLG (GOWN DISPOSABLE) ×6 IMPLANT
GOWN STRL REUS W/TWL LRG LVL3 (GOWN DISPOSABLE) ×3 IMPLANT
HANDPIECE INTERPULSE COAX TIP (DISPOSABLE) ×3
HEAD BIPOLAR DEPUY 51 (Hips) IMPLANT
HEAD FEM STD 28X+1.5 STRL (Hips) ×2 IMPLANT
HOOD PEEL AWAY FLYTE STAYCOOL (MISCELLANEOUS) ×9 IMPLANT
KIT BASIN OR (CUSTOM PROCEDURE TRAY) ×3 IMPLANT
KIT TURNOVER KIT A (KITS) IMPLANT
MARKER SKIN DUAL TIP RULER LAB (MISCELLANEOUS) ×3 IMPLANT
NDL SPNL 18GX3.5 QUINCKE PK (NEEDLE) ×1 IMPLANT
NEEDLE SPNL 18GX3.5 QUINCKE PK (NEEDLE) ×3 IMPLANT
PACK ANTERIOR HIP CUSTOM (KITS) ×3 IMPLANT
PACK UNIVERSAL I (CUSTOM PROCEDURE TRAY) ×6 IMPLANT
PENCIL SMOKE EVACUATOR (MISCELLANEOUS) IMPLANT
RETRACTOR WND ALEXIS 18 MED (MISCELLANEOUS) ×1 IMPLANT
RTRCTR WOUND ALEXIS 18CM MED (MISCELLANEOUS) ×3
SAW OSC TIP CART 19.5X105X1.3 (SAW) ×3 IMPLANT
SET HNDPC FAN SPRY TIP SCT (DISPOSABLE) ×1 IMPLANT
STAPLER VISISTAT 35W (STAPLE) ×3 IMPLANT
STEM CORAIL KA15 (Stem) ×2 IMPLANT
SUT ETHIBOND 2 V 37 (SUTURE) ×3 IMPLANT
SUT MNCRL AB 3-0 PS2 18 (SUTURE) ×3 IMPLANT
SUT NYLON 3 0 (SUTURE) ×2 IMPLANT
SUT VIC AB 0 CT1 36 (SUTURE) ×3 IMPLANT
SUT VIC AB 1 CTX 36 (SUTURE) ×3
SUT VIC AB 1 CTX36XBRD ANBCTR (SUTURE) ×1 IMPLANT
SUT VIC AB 2-0 CT1 27 (SUTURE) ×3
SUT VIC AB 2-0 CT1 TAPERPNT 27 (SUTURE) ×1 IMPLANT
TOWEL OR 17X26 10 PK STRL BLUE (TOWEL DISPOSABLE) ×3 IMPLANT
TOWEL OR NON WOVEN STRL DISP B (DISPOSABLE) ×3 IMPLANT
TRAY FOLEY MTR SLVR 16FR STAT (SET/KITS/TRAYS/PACK) IMPLANT

## 2019-05-03 NOTE — Progress Notes (Signed)
Dentures have been hard to get out told dayshift nurse to pass on to pre-op

## 2019-05-03 NOTE — H&P (Signed)

## 2019-05-03 NOTE — Progress Notes (Signed)
Jeremy Kelley, Jeremy DOA: 04/30/2019 PCP: [name], Jeremy   Brief Narrative:  Patient is a 86 year old male with history of hypertension, hyperlipidemia, Chronic diastolic CHF, pulmonary hypertension, chronic A. fib on anticoagulation, chronic carotid artery stenosis bilaterally, chronic back pain, urethral carcinoma, prostate cancer, recent closed fracture of left tibia status post hemiarthroplasty on 03/26/2019 who presents after having  fall at home.  X-ray of the right pelvis showed acute displaced humeral neck fracture.  Orthopedics consulted.  Planning for ORIF today.  Assessment & Plan:   Principal Problem:   Displaced fracture of right femoral neck (HCC) Active Problems:   Hyperlipidemia   Essential hypertension   Atrial fibrillation, chronic (HCC)   Pulmonary hypertension (HCC)   CAD (coronary artery disease)   Dementia ([place])   DNR (do not resuscitate)   Fall at home, initial encounter   Pressure injury of skin   Displaced right femoral fracture secondary to mechanical fall: Orthopedics following.  Continue pain management.  Planning for ORIF.  Surgery has been scheduled for today.  Chronic A. fib: On Eliquis at home.  Eliquis on hold for surgery.  On metoprolol for rate control.  on Lovenox for DVT prophylaxis for now.  Mild dementia: On Aricept  Hypertension: Blood pressure currently stable.  Continue current regimen  Pulmonary hypertension with diastolic CHF: Last echocardiogram done in January 2020 showed preserved ejection fraction of 55 to 60%.  Appears euvolemic.  On torsemide 20 mg daily which we will continue  Hyperlipidemia: Continue statin  CODE STATUS: DNR         DVT prophylaxis:Lovenox Code Status: DNR Family Communication: POA on 05/02/19 Disposition Plan: Patient is from home.  He will undergo PT/OT evaluation after ORIF.   Consultants: Orthopedics  Procedures: Kelley  Antimicrobials:    Anti-infectives (From admission, onward)   Start     Dose/Rate Route Frequency Ordered Stop   05/03/19 1030  ceFAZolin (ANCEF) IVPB 2g/100 mL premix  Status:  Discontinued     2 g 200 mL/hr over 30 Minutes Intravenous On call to O.R. 05/03/19 1016 05/03/19 1021   05/03/19 0600  ceFAZolin (ANCEF) IVPB 2g/100 mL premix    Note to Pharmacy: Anesthesia to give preop   2 g 200 mL/hr over 30 Minutes Intravenous On call to O.R. 05/02/19 1830 05/04/19 0559      Subjective: Patient seen and examined at the bedside this morning.  Hemodynamically stable.  Appears comfortable.  Pain looks well controlled.  Waiting for surgery today  Objective: Vitals:   05/02/19 0657 05/02/19 1350 05/02/19 2144 05/03/19 0533  BP: (!) 125/57 117/62 125/61 (!) 118/51  Pulse: 69 67 68 60  Resp: 17  16 15   Temp: (!) 97.5 F (36.4 C) (!) 97.5 F (36.4 C) 98.2 F (36.8 C) 98.2 F (36.8 C)  TempSrc:  Oral Oral Oral  SpO2: 100% 96% 99% 100%  Weight:    55.2 kg  Height:        Intake/Output Summary (Last 24 hours) at 05/03/2019 1207 Last data filed at 05/03/2019 0715 Gross per 24 hour  Intake 472 ml  Output 800 ml  Net -328 ml   Filed Weights   04/30/19 2239 05/03/19 0533  Weight: 57.1 kg 55.2 kg    Examination:  General exam: Pleasant elderly gentleman, deconditioned, debilitated  Respiratory system: No wheezes or crackles Cardiovascular system: A. fib, rate controlled  gastrointestinal system: Abdomen is nondistended, soft and nontender.  Central nervous  system: Alert and oriented. No focal neurological deficits. Extremities: No edema, clubbing or cyanosis.  Tenderness on the right hip  Data Reviewed: I have personally reviewed following labs and imaging studies  CBC: Recent Labs  Lab 04/30/19 1015 05/01/19 0527 05/02/19 0509  WBC 10.2 9.1 8.8  NEUTROABS 8.9*  --  7.0  HGB 13.0 12.5* 11.7*  HCT 40.9 39.5 37.1*  MCV 100.0 100.3* 100.3*  PLT 262 228 [id number]   Basic Metabolic Panel: Recent  Labs  Lab 04/30/19 1015 05/01/19 0527  NA 141 142  K 4.5 4.1  CL 105 106  CO2 27 28  GLUCOSE 111* 100*  BUN 20 20  CREATININE 0.83 1.08  CALCIUM 10.1 9.6   GFR: Estimated Creatinine Clearance: 35.5 mL/min (by C-G formula based on SCr of 1.08 mg/dL). Liver Function Tests: No results for input(s): AST, ALT, ALKPHOS, BILITOT, PROT, ALBUMIN in the last 168 hours. No results for input(s): LIPASE, AMYLASE in the last 168 hours. No results for input(s): AMMONIA in the last 168 hours. Coagulation Profile: Recent Labs  Lab 04/30/19 1015  INR 1.3*   Cardiac Enzymes: No results for input(s): CKTOTAL, CKMB, CKMBINDEX, TROPONINI in the last 168 hours. BNP (last 3 results) Recent Labs    Kelley/21/20 1247  PROBNP 459.0*   HbA1C: No results for input(s): HGBA1C in the last 72 hours. CBG: No results for input(s): GLUCAP in the last 168 hours. Lipid Profile: No results for input(s): CHOL, HDL, LDLCALC, TRIG, CHOLHDL, LDLDIRECT in the last 72 hours. Thyroid Function Tests: No results for input(s): TSH, T4TOTAL, FREET4, T3FREE, THYROIDAB in the last 72 hours. Anemia Panel: No results for input(s): VITAMINB12, FOLATE, FERRITIN, TIBC, IRON, RETICCTPCT in the last 72 hours. Sepsis Labs: No results for input(s): PROCALCITON, LATICACIDVEN in the last 168 hours.  Recent Results (from the past 240 hour(s))  Respiratory Panel by RT PCR (Flu A&B, Covid) - Nasopharyngeal Swab     Status: Kelley   Collection Time: 04/30/19 10:13 AM   Specimen: Nasopharyngeal Swab  Result Value Ref Range Status   SARS Coronavirus 2 by RT PCR NEGATIVE NEGATIVE Final    Comment: (NOTE) SARS-CoV-2 target nucleic acids are NOT DETECTED. The SARS-CoV-2 RNA is generally detectable in upper respiratoy specimens during the acute phase of infection. The lowest concentration of SARS-CoV-2 viral copies this assay can detect is 131 copies/mL. A negative result does not preclude SARS-Cov-2 infection and should not be used as  the sole basis for treatment or other patient management decisions. A negative result may occur with  improper specimen collection/handling, submission of specimen other than nasopharyngeal swab, presence of viral mutation(s) within the areas targeted by this assay, and inadequate number of viral copies (<131 copies/mL). A negative result must be combined with clinical observations, patient history, and epidemiological information. The expected result is Negative. Fact Sheet for Patients:  PinkCheek.be Fact Sheet for Healthcare Providers:  GravelBags.it This test is not yet ap proved or cleared by the [place] FDA and  has been authorized for detection and/or diagnosis of SARS-CoV-2 by FDA under an Emergency Use Authorization (EUA). This EUA will remain  in effect (meaning this test can be used) for the duration of the COVID-19 declaration under Section 564(b)(1) of the Act, 21 U.S.C. section 360bbb-3(b)(1), unless the authorization is terminated or revoked sooner.    Influenza A by PCR NEGATIVE NEGATIVE Final   Influenza B by PCR NEGATIVE NEGATIVE Final    Comment: (NOTE) The Xpert Xpress SARS-CoV-2/FLU/RSV assay is intended  as an aid in  the diagnosis of influenza from Nasopharyngeal swab specimens and  should not be used as a sole basis for treatment. Nasal washings and  aspirates are unacceptable for Xpert Xpress SARS-CoV-2/FLU/RSV  testing. Fact Sheet for Patients: PinkCheek.be Fact Sheet for Healthcare Providers: GravelBags.it This test is not yet approved or cleared by the [place] FDA and  has been authorized for detection and/or diagnosis of SARS-CoV-2 by  FDA under an Emergency Use Authorization (EUA). This EUA will remain  in effect (meaning this test can be used) for the duration of the  Covid-19 declaration under Section 564(b)(1) of the Act, 21    U.S.C. section 360bbb-3(b)(1), unless the authorization is  terminated or revoked. Performed at [facility], [place] [address] [place] [address]          Radiology Studies: No results found.      Scheduled Meds: . docusate  100 mg Oral BID  . [MAR Hold] donepezil  5 mg Oral QHS  . [MAR Hold] fluorometholone  1 drop Both Eyes QID  . [MAR Hold] fluticasone  2 spray Each Nare QHS  . Lifitegrast  1 drop Both Eyes BID  . [MAR Hold] metoprolol succinate  25 mg Oral QHS  . [MAR Hold] mirabegron ER  50 mg Oral Daily  . [MAR Hold] phosphorus  500 mg Oral BID  . [MAR Hold] potassium chloride  10 mEq Oral Daily  . [MAR Hold] pravastatin  20 mg Oral q1800  . [MAR Hold] senna-docusate  1 tablet Oral QHS  . [MAR Hold] torsemide  20 mg Oral Daily  . tranexamic acid (CYKLOKAPRON) topical -INTRAOP  2,000 mg Topical To OR   Continuous Infusions: .  ceFAZolin (ANCEF) IV    . tranexamic acid       LOS: 3 days    Time spent: 25 mins.More than 50% of that time was spent in counseling and/or coordination of care.      Shelly Coss, MD [facility] Pager [phone]  If 7PM-7AM, please contact night-coverage [email or web address] Password [facility] 05/03/2019, 12:07 PM

## 2019-05-03 NOTE — Anesthesia Preprocedure Evaluation (Signed)
Anesthesia Evaluation  Patient identified by MRN, date of birth, ID band Patient awake    Reviewed: Allergy & Precautions, NPO status , Patient's Chart, lab work & pertinent test results, reviewed documented beta blocker date and time   History of Anesthesia Complications Negative for: history of anesthetic complications  Airway Mallampati: II  TM Distance: >3 FB Neck ROM: Full    Dental  (+) Dental Advisory Given   Pulmonary neg recent URI,    breath sounds clear to auscultation       Cardiovascular hypertension, Pt. on medications and Pt. on home beta blockers (-) angina+ CAD  (-) Past MI + Valvular Problems/Murmurs AI  Rhythm:Regular + Diastolic murmurs    Neuro/Psych Carotid artery stenosis  bilateral 40-59% stenosis   negative psych ROS   GI/Hepatic negative GI ROS, (+) Hepatitis -  Endo/Other  negative endocrine ROS  Renal/GU Renal InsufficiencyRenal disease     Musculoskeletal  (+) Arthritis , Left femoral neck fracture   Abdominal   Peds  Hematology  (+) Blood dyscrasia, , eliquis last dose 12/12   Anesthesia Other Findings   Reproductive/Obstetrics                             Anesthesia Physical  Anesthesia Plan  ASA: III  Anesthesia Plan: General   Post-op Pain Management:    Induction: Intravenous  PONV Risk Score and Plan: 2 and Ondansetron and Dexamethasone  Airway Management Planned: Oral ETT and LMA  Additional Equipment: None  Intra-op Plan:   Post-operative Plan: Extubation in OR  Informed Consent: I have reviewed the patients History and Physical, chart, labs and discussed the procedure including the risks, benefits and alternatives for the proposed anesthesia with the patient or authorized representative who has indicated his/her understanding and acceptance.     Dental advisory given  Plan Discussed with: CRNA, Surgeon and  Anesthesiologist  Anesthesia Plan Comments:         Anesthesia Quick Evaluation

## 2019-05-03 NOTE — Op Note (Signed)
RIGHT HEMI HIP ARTHROPLASTY ANTERIOR APPROACH  Procedure Note Jeremy Kelley   [id number]  Pre-op Diagnosis: fractured right femoral neck     Post-op Diagnosis: same   Operative Procedures  1. Prosthetic replacement for femoral neck fracture. CPT [phone]  Personnel  Surgeon(s): Leandrew Koyanagi, MD  ASSIST: Madalyn Rob, PA-C; necessary for the timely completion of procedure and due to complexity of procedure.   Anesthesia: general  Prosthesis: depuy Femur: corail ka 15 Head: 51 mm size: +1.5 Bearing Type: bipolar  Hip Hemiarthroplasty (Anterior Approach) Op Note:  After informed consent was obtained and the operative extremity marked in the holding area, the patient was brought back to the operating room and placed supine on the HANA table. Next, the operative extremity was prepped and draped in normal sterile fashion. Surgical timeout occurred verifying patient identification, surgical site, surgical procedure and administration of antibiotics.  A modified anterior Smith-Peterson approach to the hip was performed, using the interval between tensor fascia lata and sartorius.  Dissection was carried bluntly down onto the anterior hip capsule. The lateral femoral circumflex vessels were identified and coagulated. A capsulotomy was performed and the capsular flaps tagged for later repair.  The neck osteotomy was performed. The femoral head was removed and found a 51 mm head was the appropriate fit.    We then turned our attention to the femur.  After placing the femoral hook, the leg was taken to externally rotated, extended and adducted position taking care to perform soft tissue releases to allow for adequate mobilization of the femur. Soft tissue was cleared from the shoulder of the greater trochanter and the hook elevator used to improve exposure of the proximal femur. Sequential broaching performed up to a size 15. Trial neck and head were placed. The leg was brought back up to  neutral and the construct reduced. The position and sizing of components, offset and leg lengths were checked using fluoroscopy. Stability of the construct was checked in extension and external rotation without any subluxation or impingement of prosthesis. We dislocated the prosthesis, dropped the leg back into position, removed trial components, and irrigated copiously. The final stem and head was then placed, the leg brought back up, the system reduced and fluoroscopy used to verify positioning.  We irrigated, obtained hemostasis and closed the capsule using #2 ethibond suture.  The fascia was closed with #1 vicryl plus, the deep fat layer was closed with 0 vicryl, the subcutaneous layers closed with 2.0 Vicryl Plus and the skin closed with 2.0 nylon and dermabond. A sterile dressing was applied. The patient was awakened in the operating room and taken to recovery in stable condition. All sponge, needle, and instrument counts were correct at the end of the case.   Position: supine  Complications: see description of procedure.  Time Out: performed   Drains/Packing: none  Estimated blood loss: see anesthesia record  Returned to Recovery Room: in good condition.   Antibiotics: yes   Mechanical VTE (DVT) Prophylaxis: sequential compression devices, TED thigh-high  Chemical VTE (DVT) Prophylaxis: lovenox  Fluid Replacement: Crystalloid: see anesthesia record  Specimens Removed: 1 to pathology   Sponge and Instrument Count Correct? yes   PACU: portable radiograph - low AP   Admission: inpatient status, start PT & OT POD#1  Plan/RTC: Return in 2 weeks for staple removal. Return in 6 weeks to see MD.  Weight Bearing/Load Lower Extremity: full  Hip precautions: none  N. Eduard Roux, MD [facility] 2:16 PM  Implant Name Type Inv. Item Serial No. Manufacturer Lot No. LRB No. Used Action  STEM CORAIL KA15 - [id number] Stem STEM CORAIL KA15  DEPUY SYNTHES [id number] Right 1 Implanted   BIPOLAR DEPUY 51MM - [id number] Hips BIPOLAR DEPUY 51MM  DEPUY SYNTHES [id number] Right 1 Implanted  HIP BALL ARTICU DEPUY - [id number] Hips HIP BALL ARTICU DEPUY  DEPUY SYNTHES [id number] Right 1 Implanted

## 2019-05-03 NOTE — Anesthesia Procedure Notes (Signed)
Procedure Name: Intubation Date/Time: 05/03/2019 1:13 PM Performed by: Gerald Leitz, CRNA Pre-anesthesia Checklist: Patient identified, Patient being monitored, Timeout performed, Emergency Drugs available and Suction available Patient Re-evaluated:Patient Re-evaluated prior to induction Oxygen Delivery Method: Circle system utilized Preoxygenation: Pre-oxygenation with 100% oxygen Induction Type: IV induction Ventilation: Mask ventilation without difficulty Laryngoscope Size: Mac and 3 Grade View: Grade I Tube type: Oral Tube size: 7.5 mm Number of attempts: 1 Airway Equipment and Method: Stylet Placement Confirmation: ETT inserted through vocal cords under direct vision,  positive ETCO2 and breath sounds checked- equal and bilateral Secured at: 21 cm Tube secured with: Tape Dental Injury: Teeth and Oropharynx as per pre-operative assessment

## 2019-05-03 NOTE — Discharge Instructions (Signed)

## 2019-05-03 NOTE — Consult Note (Signed)
   [facility] Inpatient Consult   05/03/2019  OVID TERRY 1927/01/17 [id number]   [facility]: ACTIVE status   Patient is currently active with [facility])Care Management services.Patient has been engaged by [facility] RN CM Coordinatorfor transition of care/ disease management and [facility] SW for community resources.  Patienthas21%risk score for unplanned readmission and 3 hospitalizations in the past 6 months.He is inthe Medicare/ NextGenACO (Accountable Care Organization)plan.  Our community based plan of care has focused on disease management and community resource support. Patient will receive a post hospital call and will be evaluated for assessments and disease process education if transitioning to home.  Review of medical record shows MD brief narrative as: 86 year old male with history of hypertension, hyperlipidemia, Chronic diastolic CHF, pulmonary hypertension, chronic A. fib on anticoagulation, chronic carotid artery stenosis bilaterally, chronic back pain, urethral carcinoma, prostate cancer, recent closed fracture of left tibia status post hemiarthroplasty on 03/26/2019    Presented after having fall at home. X-ray of the right pelvis showed acute displaced humeral neck fracture, underwent right hip hemiarthroplasty.  Primary Care ProviderisCory Nafziger with [facility] [place], listed as providing transition of care follow-up.  Current discharge dispositionstill uncertain at this time. PT/ OT will evaluate patient post surgery.  Plan: Will followprogression/needsand recommendations fordisposition with Inpatient transition of care team. Will notify [facility]) RN care Nurse, children's and SW of disposition and transitional needs.  Of note, [facility] Care Management services does not replace or interfere with any services that are arranged by inpatient case management or social work.   For additional questions or  referrals please contact:  Edwena Felty A. Ailene Royal, BSN, RN-BC [facility] Liaison Cell: [phone]

## 2019-05-03 NOTE — Transfer of Care (Signed)
Immediate Anesthesia Transfer of Care Note  Patient: Jeremy Kelley  Procedure(s) Performed: Procedure(s) with comments: RIGHT HEMI HIP ARTHROPLASTY ANTERIOR APPROACH (Right) - WIll Need Retractor Holder  Patient Location: PACU  Anesthesia Type:General  Level of Consciousness: Alert, Awake, Oriented  Airway & Oxygen Therapy: Patient Spontanous Breathing  Post-op Assessment: Report given to RN  Post vital signs: Reviewed and stable  Last Vitals:  Vitals:   05/03/19 0533 05/03/19 1211  BP: (!) 118/51 124/62  Pulse: 60 64  Resp: 15 12  Temp: 36.8 C 36.8 C  SpO2: [id number]    Complications: No apparent anesthesia complications

## 2019-05-03 NOTE — Anesthesia Postprocedure Evaluation (Signed)
Anesthesia Post Note  Patient: Jeremy Kelley  Procedure(s) Performed: RIGHT HEMI HIP ARTHROPLASTY ANTERIOR APPROACH (Right Hip)     Patient location during evaluation: PACU Anesthesia Type: General Level of consciousness: awake and alert Pain management: pain level controlled Vital Signs Assessment: post-procedure vital signs reviewed and stable Respiratory status: spontaneous breathing, nonlabored ventilation, respiratory function stable and patient connected to nasal cannula oxygen Cardiovascular status: blood pressure returned to baseline and stable Postop Assessment: no apparent nausea or vomiting Anesthetic complications: no    Last Vitals:  Vitals:   05/03/19 1452 05/03/19 1500  BP: (!) 146/81 (!) 164/93  Pulse: 66   Resp: 16 20  Temp: 36.4 C   SpO2: 100% 100%    Last Pain:  Vitals:   05/03/19 1211  TempSrc: Oral  PainSc: 2                  Reve Crocket

## 2019-05-04 ENCOUNTER — Encounter: Payer: Self-pay | Admitting: *Deleted

## 2019-05-04 LAB — CBC WITH DIFFERENTIAL/PLATELET
Abs Immature Granulocytes: 0.06 10*3/uL (ref 0.00–0.07)
Basophils Absolute: 0 10*3/uL (ref 0.0–0.1)
Basophils Relative: 0 %
Eosinophils Absolute: 0 10*3/uL (ref 0.0–0.5)
Eosinophils Relative: 0 %
HCT: 38.1 % — ABNORMAL LOW (ref 39.0–52.0)
Hemoglobin: 12 g/dL — ABNORMAL LOW (ref 13.0–17.0)
Immature Granulocytes: 1 %
Lymphocytes Relative: 8 %
Lymphs Abs: 0.9 10*3/uL (ref 0.7–4.0)
MCH: 31.8 pg (ref 26.0–34.0)
MCHC: 31.5 g/dL (ref 30.0–36.0)
MCV: 101.1 fL — ABNORMAL HIGH (ref 80.0–100.0)
Monocytes Absolute: 0.6 10*3/uL (ref 0.1–1.0)
Monocytes Relative: 5 %
Neutro Abs: 10 10*3/uL — ABNORMAL HIGH (ref 1.7–7.7)
Neutrophils Relative %: 86 %
Platelets: 240 10*3/uL (ref 150–400)
RBC: 3.77 MIL/uL — ABNORMAL LOW (ref 4.22–5.81)
RDW: 13.4 % (ref 11.5–15.5)
WBC: 11.6 10*3/uL — ABNORMAL HIGH (ref 4.0–10.5)
nRBC: 0 % (ref 0.0–0.2)

## 2019-05-04 LAB — BASIC METABOLIC PANEL
Anion gap: 10 (ref 5–15)
BUN: 26 mg/dL — ABNORMAL HIGH (ref 8–23)
CO2: 28 mmol/L (ref 22–32)
Calcium: 9.5 mg/dL (ref 8.9–10.3)
Chloride: 104 mmol/L (ref 98–111)
Creatinine, Ser: 0.93 mg/dL (ref 0.61–1.24)
GFR calc Af Amer: >60 mL/min (ref >60)
GFR calc non Af Amer: >60 mL/min (ref >60)
Glucose, Bld: 145 mg/dL — ABNORMAL HIGH (ref 70–99)
Potassium: 5.1 mmol/L (ref 3.5–5.1)
Sodium: 142 mmol/L (ref 135–145)

## 2019-05-04 LAB — URINE CULTURE

## 2019-05-04 NOTE — Evaluation (Signed)
Physical Therapy Evaluation Patient Details Name: Jeremy Kelley MRN: [id number] DOB: November 26, 1926 Today's Date: 05/04/2019   History of Present Illness  Patient is a 86 year old male with history of hypertension, hyperlipidemia, Chronic dCHF, pulmonary hypertension, chronic A. fib on anticoagulation, carotid artery stenosis bilaterally, chronic back pain, urethral carcinoma, prostate cancer, recent closed fracture of left tibia status post hemiarthroplasty on 03/26/2019 who presents after having  fall at home.  X-ray of the right pelvis showed acute displaced femoral neck fracture.  Orthopedics consulted.  Underwent right hip hemi arthroplasty on 05/03/2019 per Dr Erlinda Hong.  Clinical Impression  Pt admitted with above diagnosis.   Pt currently with functional limitations due to the deficits listed below (see PT Problem List). Pt will benefit from skilled PT to increase their independence and safety with mobility to allow discharge to the venue listed below.    Follow Up Recommendations SNF    Equipment Recommendations  None recommended by PT    Recommendations for Other Services       Precautions / Restrictions Precautions Precautions: Fall Restrictions Weight Bearing Restrictions: No Other Position/Activity Restrictions: WBAT      Mobility  Bed Mobility Overal bed mobility: Needs Assistance Bed Mobility: Supine to Sit     Supine to sit: Mod assist;+2 for safety/equipment     General bed mobility comments: assist with LEs and trunk, incr time  Transfers Overall transfer level: Needs assistance Equipment used: Rolling walker (2 wheeled) Transfers: Sit to/from [facility] Sit to Stand: Mod assist Stand pivot transfers: +2 safety/equipment;Mod assist       General transfer comment: assist to rise and stabilize, transition to RW.  Ambulation/Gait Ambulation/Gait assistance: Min assist;Mod assist;+2 safety/equipment Gait Distance (Feet): 6 Feet(forward and  back to chair) Assistive device: Rolling walker (2 wheeled) Gait Pattern/deviations: Step-to pattern     General Gait Details: assist for repeated posterior LOB, cues for sequence adn RW position  Stairs            Wheelchair Mobility    Modified Rankin (Stroke Patients Only)       Balance Overall balance assessment: History of Falls;Needs assistance Sitting-balance support: No upper extremity supported Sitting balance-Leahy Scale: Fair     Standing balance support: Bilateral upper extremity supported Standing balance-Leahy Scale: Poor Standing balance comment: reliant on UEs and RW                             Pertinent Vitals/Pain Pain Assessment: No/denies pain    Home Living Family/patient expects to be discharged to:: Skilled nursing facility Living Arrangements: Alone                    Prior Function           Comments: pt just recently d/c from [facility]     Hand Dominance        Extremity/Trunk Assessment   Upper Extremity Assessment Upper Extremity Assessment: Generalized weakness    Lower Extremity Assessment Lower Extremity Assessment: Generalized weakness       Communication   Communication: HOH  Cognition Arousal/Alertness: Awake/alert Behavior During Therapy: WFL for tasks assessed/performed Overall Cognitive Status: Within Functional Limits for tasks assessed                                 General Comments: followed all commands. Appeared in [facility] but feels like  the teeth in his room are not his. Unsure if pt confused ?      General Comments      Exercises General Exercises - Lower Extremity Ankle Circles/Pumps: AROM;Both;10 reps   Assessment/Plan    PT Assessment Patient needs continued PT services  PT Problem List Decreased strength;Decreased range of motion;Decreased activity tolerance;Decreased balance;Decreased knowledge of use of DME;Decreased mobility;Decreased safety  awareness       PT Treatment Interventions DME instruction;Gait training;Functional mobility training;Therapeutic activities;Patient/family education;Therapeutic exercise    PT Goals (Current goals can be found in the Care Plan section)  Acute Rehab PT Goals Patient Stated Goal: get well PT Goal Formulation: With patient Time For Goal Achievement: 05/11/19 Potential to Achieve Goals: Good    Frequency Min 3X/week   Barriers to discharge        Co-evaluation PT/OT/SLP Co-Evaluation/Treatment: Yes Reason for Co-Treatment: To address functional/ADL transfers PT goals addressed during session: Mobility/safety with mobility;Proper use of DME OT goals addressed during session: ADL's and self-care       AM-PAC PT "6 Clicks" Mobility  Outcome Measure Help needed turning from your back to your side while in a flat bed without using bedrails?: A Lot Help needed moving from lying on your back to sitting on the side of a flat bed without using bedrails?: A Lot Help needed moving to and from a bed to a chair (including a wheelchair)?: A Lot Help needed standing up from a chair using your arms (e.g., wheelchair or bedside chair)?: A Lot Help needed to walk in hospital room?: A Lot Help needed climbing 3-5 steps with a railing? : Total 6 Click Score: 11    End of Session Equipment Utilized During Treatment: Gait belt Activity Tolerance: Patient tolerated treatment well;Patient limited by fatigue Patient left: in chair;with call bell/phone within reach;with chair alarm set Nurse Communication: Mobility status PT Visit Diagnosis: Difficulty in walking, not elsewhere classified (R26.2)    Time: [id number] PT Time Calculation (min) (ACUTE ONLY): 29 min   Charges:   PT Evaluation $PT Eval Moderate Complexity: 1 Mod          Nastacia Raybuck, PT   Acute Rehab Dept [facility]): [id number]   05/04/2019   [facility] 05/04/2019, 3:02 PM

## 2019-05-04 NOTE — TOC Initial Note (Signed)
Transition of Care [facility]) - Initial/Assessment Note    Patient Details  Name: Jeremy Kelley MRN: [id number] Date of Birth: 12/21/1926  Transition of [facility]) CM/SW Contact:    Trish Mage, LCSW Phone Number: 05/04/2019, 4:30 PM  Clinical Narrative:  Mr Jeremy Kelley is s/p surgery for fracture of ri femoral neck.  He is resigned to returning to rehab only a month or so having previously been.  He asks me to call [name], whom he refers to as his stepson.  [name] identifies [facility], [address] and [facility] as their top 3 choices.  Bed search sent out. TOC will continue to follow during the course of hospitalization.                  Expected Discharge Plan: Skilled Nursing Facility Barriers to Discharge: SNF Pending bed offer   Patient Goals and CMS Choice Patient states their goals for this hospitalization and ongoing recovery are:: "I have no choice but to go back to rehab." CMS Medicare.gov Compare Post Acute Care list provided to:: Patient Choice offered to / list presented to : Patient  Expected Discharge Plan and Services Expected Discharge Plan: [place]   Discharge Planning Services: CM Consult Post Acute Care Choice: [place] Living arrangements for the past 2 months: Apartment                                      Prior Living Arrangements/Services Living arrangements for the past 2 months: Apartment Lives with:: Self Patient language and need for interpreter reviewed:: Yes Do you feel safe going back to the place where you live?: Yes      Need for Family Participation in Patient Care: Yes (Comment) Care giver support system in place?: Yes (comment) Current home services: DME Criminal Activity/Legal Involvement Pertinent to Current Situation/Hospitalization: No - Comment as needed  Activities of Daily Living Home Assistive Devices/Equipment: Other (Comment), Walker (specify type), Cane (specify quad or  straight)(single point cane, front wheeled walker, tub/shower unit) ADL Screening (condition at time of admission) Patient's cognitive ability adequate to safely complete daily activities?: Yes Is the patient deaf or have difficulty hearing?: Yes(HOH) Does the patient have difficulty seeing, even when wearing glasses/contacts?: No Does the patient have difficulty concentrating, remembering, or making decisions?: No Patient able to express need for assistance with ADLs?: Yes Does the patient have difficulty dressing or bathing?: Yes Independently performs ADLs?: No Communication: Independent Dressing (OT): Needs assistance Is this a change from baseline?: Pre-admission baseline Grooming: Needs assistance Is this a change from baseline?: Pre-admission baseline Feeding: Needs assistance Is this a change from baseline?: Pre-admission baseline Bathing: Needs assistance Is this a change from baseline?: Pre-admission baseline Toileting: Dependent Is this a change from baseline?: Change from baseline, expected to last >3days In/Out Bed: Dependent Is this a change from baseline?: Change from baseline, expected to last >3 days Walks in Home: Dependent Is this a change from baseline?: Change from baseline, expected to last >3 days Does the patient have difficulty walking or climbing stairs?: Yes(secondary to right hip pain and left leg weakness) Weakness of Legs: Both Weakness of Arms/Hands: None  Permission Sought/Granted Permission sought to share information with : Family Supports Permission granted to share information with : Yes, Verbal Permission Granted  Share Information with NAME: Bo Merino     Permission granted to share info w  Relationship: step-son  Permission granted to share info w Contact Information: [id number]  Emotional Assessment Appearance:: Appears stated age Attitude/Demeanor/Rapport: Engaged Affect (typically observed): Appropriate Orientation: : Oriented to  Self, Oriented to Place, Oriented to Situation Alcohol / Substance Use: Not Applicable Psych Involvement: No (comment)  Admission diagnosis:  Fall [W19.XXXA] Fall, initial encounter [id number]] Displaced fracture of right femoral neck ([place]) [S72.001A] Closed fracture of right hip requiring operative repair, initial encounter [facility]) [S72.001A] Patient Active Problem List   Diagnosis Date Noted  . Pressure injury of skin 05/02/2019  . Displaced fracture of right femoral neck ([place]) 04/30/2019  . DNR (do not resuscitate) 04/30/2019  . Fall at home, initial encounter 04/30/2019  . History of hemiarthroplasty of left hip 04/25/2019  . Hip fracture ([place]) 04/21/2019  . Dementia ([place]) 04/08/2019  . S/P hip hemiarthroplasty   . Small bowel obstruction ([place]) 02/21/2019  . Closed pilon fracture, left, sequela 10/11/2016  . Pain in left ankle and joints of left foot 10/04/2016  . Idiopathic chronic venous hypertension of left lower extremity with ulcer and inflammation ([place]) 10/04/2016  . Fatigue 11/04/2015  . CAD (coronary artery disease) 08/01/2015  . Pulmonary hypertension ([place]) 07/24/2015  . Atrial fibrillation, chronic ([place]) 07/16/2015  . Aortic insufficiency 01/04/2014  . Squamous cell carcinoma in situ of skin of right lower leg 11/13/2013  . Lentigo maligna of cheek ([place]) 09/19/2013  . Actinic keratosis 11/06/2012  . Macular degeneration 08/10/2012  . Allergic rhinitis 08/10/2012  . Bladder cancer ([place]) 10/19/2011  . Eczema 02/23/2011  . Hyperglycemia 02/11/2011  . Heart murmur 09/10/2010  . ABNORMAL THYROID FUNCTION TESTS 08/05/2009  . ABNORMAL HEART RHYTHMS 07/11/2009  . Hyperlipidemia 11/28/2008  . CAROTID BRUIT 11/19/2008  . UNSPECIFIED CATARACT 10/17/2008  . PARESTHESIA 12/04/2007  . Varicose veins of bilateral lower extremities with other complications [id number]  . COLONIC POLYPS, HX OF 08/28/2007  . Essential hypertension 02/08/2007  . PROSTATE CANCER, HX OF 11/04/2006   . INGUINAL HERNIA, HX OF 11/04/2006   PCP:  [name], NP Pharmacy:   [id number] [place], [place] [id number] [place] [id number] Phone: [phone] Fax: [phone]     Social Determinants of Health (SDOH) Interventions    Readmission Risk Interventions No flowsheet data found.

## 2019-05-04 NOTE — Progress Notes (Signed)
PROGRESS NOTE    Jeremy Kelley  [id number] DOB: [birth year] DOA: 04/30/2019 PCP: [name], NP   Brief Narrative:  Patient is a 86 year old male with history of hypertension, hyperlipidemia, Chronic diastolic CHF, pulmonary hypertension, chronic A. fib on anticoagulation, chronic carotid artery stenosis bilaterally, chronic back pain, urethral carcinoma, prostate cancer, recent closed fracture of left tibia status post hemiarthroplasty on 03/26/2019 who presents after having  fall at home.  X-ray of the right pelvis showed acute displaced humeral neck fracture.  Orthopedics consulted.  Underwent right hemihip arthroplasty on 05/03/2019.  PT/OT evaluation pending.  Most likely he will need skilled nursing facility on discharge.  Assessment & Plan:   Principal Problem:   Displaced fracture of right femoral neck (HCC) Active Problems:   Hyperlipidemia   Essential hypertension   Atrial fibrillation, chronic (HCC)   Pulmonary hypertension (HCC)   CAD (coronary artery disease)   Dementia ([place])   DNR (do not resuscitate)   Fall at home, initial encounter   Pressure injury of skin   Displaced right femoral fracture secondary to mechanical fall: Orthopedics following.  Continue pain management. Underwent right hemihip arthroplasty on 05/03/2019.  PT/OT evaluation pending.  Most likely he will need skilled nursing facility on discharge.  Chronic A. fib: On Eliquis at home.Resumed.  On metoprolol for rate control.  .  Mild dementia: On Aricept.  Mostly alert and oriented.  Hypertension: Blood pressure currently stable.  Continue current regimen  Pulmonary hypertension with diastolic CHF: Last echocardiogram done in January 2020 showed preserved ejection fraction of 55 to 60%.  Appears euvolemic.  On torsemide 20 mg daily which we will continue  Hyperlipidemia: Continue statin  History of urethral/prostate cancer: Currently in remission  CODE STATUS: DNR         DVT  prophylaxis:Lovenox Code Status: DNR Family Communication: POA on 05/02/19 Disposition Plan: Patient is from home.  He is waiting for PT/OT evaluation after hip surgery.  Most likely he will need skilled nursing facility on discharge.  Consultants: Orthopedics  Procedures: None  Antimicrobials:  Anti-infectives (From admission, onward)   Start     Dose/Rate Route Frequency Ordered Stop   05/03/19 2000  ceFAZolin (ANCEF) IVPB 2g/100 mL premix     2 g 200 mL/hr over 30 Minutes Intravenous Every 6 hours 05/03/19 1657 05/04/19 1154   05/03/19 1354  vancomycin (VANCOCIN) powder  Status:  Discontinued       As needed 05/03/19 1355 05/03/19 1630   05/03/19 1030  ceFAZolin (ANCEF) IVPB 2g/100 mL premix  Status:  Discontinued     2 g 200 mL/hr over 30 Minutes Intravenous On call to O.R. 05/03/19 1016 05/03/19 1021   05/03/19 0600  ceFAZolin (ANCEF) IVPB 2g/100 mL premix    Note to Pharmacy: Anesthesia to give preop   2 g 200 mL/hr over 30 Minutes Intravenous On call to O.R. 05/02/19 1830 05/03/19 1315      Subjective: Patient seen and examined at the bedside this morning.  Hemodynamically stable.  Pain looks well controlled.  No active issues at this time.  Waiting for PT/OT evaluation  Objective: Vitals:   05/03/19 1730 05/03/19 2046 05/04/19 0600 05/04/19 0835  BP: 136/63 135/79 (!) 128/58 (!) 117/56  Pulse: 72 75 66 63  Resp: 16 20 18 16   Temp: (!) 97.5 F (36.4 C) 98.2 F (36.8 C) 98.4 F (36.9 C) (!) 97.5 F (36.4 C)  TempSrc: Oral Oral Oral Oral  SpO2: 100% 100% 97% 100%  Weight:  Height:        Intake/Output Summary (Last 24 hours) at 05/04/2019 1341 Last data filed at 05/04/2019 1335 Gross per 24 hour  Intake 3598.26 ml  Output 1575 ml  Net 2023.26 ml   Filed Weights   04/30/19 2239 05/03/19 0533  Weight: 57.1 kg 55.2 kg    Examination:   General exam: Pleasant elderly gentleman, deconditioned, debilitated  Respiratory system: Bilateral equal air entry,  normal vesicular breath sounds, no wheezes or crackles  Cardiovascular system: Afib, No JVD, murmurs, rubs, gallops or clicks. Gastrointestinal system: Abdomen is nondistended, soft and nontender.  Normal bowel sounds heard. Central nervous system: Alert and oriented. No focal neurological deficits. Extremities: No edema, clubbing or cyanosis.  Tenderness on the right hip.  Clean surgical wound on the right hip Skin: No  lesions or ulcers,no icterus ,no pallor    Data Reviewed: I have personally reviewed following labs and imaging studies  CBC: Recent Labs  Lab 04/30/19 1015 05/01/19 0527 05/02/19 0509 05/04/19 0553  WBC 10.2 9.1 8.8 11.6*  NEUTROABS 8.9*  --  7.0 10.0*  HGB 13.0 12.5* 11.7* 12.0*  HCT 40.9 39.5 37.1* 38.1*  MCV 100.0 100.3* 100.3* 101.1*  PLT 262 228 222 [id number]   Basic Metabolic Panel: Recent Labs  Lab 04/30/19 1015 05/01/19 0527 05/04/19 0553  NA 141 142 142  K 4.5 4.1 5.1  CL 105 106 104  CO2 27 28 28   GLUCOSE 111* 100* 145*  BUN 20 20 26*  CREATININE 0.83 1.08 0.93  CALCIUM 10.1 9.6 9.5   GFR: Estimated Creatinine Clearance: 41.2 mL/min (by C-G formula based on SCr of 0.93 mg/dL). Liver Function Tests: No results for input(s): AST, ALT, ALKPHOS, BILITOT, PROT, ALBUMIN in the last 168 hours. No results for input(s): LIPASE, AMYLASE in the last 168 hours. No results for input(s): AMMONIA in the last 168 hours. Coagulation Profile: Recent Labs  Lab 04/30/19 1015  INR 1.3*   Cardiac Enzymes: No results for input(s): CKTOTAL, CKMB, CKMBINDEX, TROPONINI in the last 168 hours. BNP (last 3 results) Recent Labs    08/31/18 1247  PROBNP 459.0*   HbA1C: No results for input(s): HGBA1C in the last 72 hours. CBG: No results for input(s): GLUCAP in the last 168 hours. Lipid Profile: No results for input(s): CHOL, HDL, LDLCALC, TRIG, CHOLHDL, LDLDIRECT in the last 72 hours. Thyroid Function Tests: No results for input(s): TSH, T4TOTAL, FREET4,  T3FREE, THYROIDAB in the last 72 hours. Anemia Panel: No results for input(s): VITAMINB12, FOLATE, FERRITIN, TIBC, IRON, RETICCTPCT in the last 72 hours. Sepsis Labs: No results for input(s): PROCALCITON, LATICACIDVEN in the last 168 hours.  Recent Results (from the past 240 hour(s))  Respiratory Panel by RT PCR (Flu A&B, Covid) - Nasopharyngeal Swab     Status: None   Collection Time: 04/30/19 10:13 AM   Specimen: Nasopharyngeal Swab  Result Value Ref Range Status   SARS Coronavirus 2 by RT PCR NEGATIVE NEGATIVE Final    Comment: (NOTE) SARS-CoV-2 target nucleic acids are NOT DETECTED. The SARS-CoV-2 RNA is generally detectable in upper respiratoy specimens during the acute phase of infection. The lowest concentration of SARS-CoV-2 viral copies this assay can detect is 131 copies/mL. A negative result does not preclude SARS-Cov-2 infection and should not be used as the sole basis for treatment or other patient management decisions. A negative result may occur with  improper specimen collection/handling, submission of specimen other than nasopharyngeal swab, presence of viral mutation(s) within the areas targeted  by this assay, and inadequate number of viral copies (<131 copies/mL). A negative result must be combined with clinical observations, patient history, and epidemiological information. The expected result is Negative. Fact Sheet for Patients:  PinkCheek.be Fact Sheet for Healthcare Providers:  GravelBags.it This test is not yet ap proved or cleared by the [place] FDA and  has been authorized for detection and/or diagnosis of SARS-CoV-2 by FDA under an Emergency Use Authorization (EUA). This EUA will remain  in effect (meaning this test can be used) for the duration of the COVID-19 declaration under Section 564(b)(1) of the Act, 21 U.S.C. section 360bbb-3(b)(1), unless the authorization is terminated or revoked  sooner.    Influenza A by PCR NEGATIVE NEGATIVE Final   Influenza B by PCR NEGATIVE NEGATIVE Final    Comment: (NOTE) The Xpert Xpress SARS-CoV-2/FLU/RSV assay is intended as an aid in  the diagnosis of influenza from Nasopharyngeal swab specimens and  should not be used as a sole basis for treatment. Nasal washings and  aspirates are unacceptable for Xpert Xpress SARS-CoV-2/FLU/RSV  testing. Fact Sheet for Patients: PinkCheek.be Fact Sheet for Healthcare Providers: GravelBags.it This test is not yet approved or cleared by the [place] FDA and  has been authorized for detection and/or diagnosis of SARS-CoV-2 by  FDA under an Emergency Use Authorization (EUA). This EUA will remain  in effect (meaning this test can be used) for the duration of the  Covid-19 declaration under Section 564(b)(1) of the Act, 21  U.S.C. section 360bbb-3(b)(1), unless the authorization is  terminated or revoked. Performed at [facility], [place] [address], [place], [place] [address]          Radiology Studies: Pelvis Portable  Result Date: 05/03/2019 CLINICAL DATA:  Right total hip replacement. EXAM: PORTABLE PELVIS 1-2 VIEWS COMPARISON:  Earlier same day as well as 03/26/2019 FINDINGS: Left hip arthroplasty intact and unchanged. Evidence of patient's recent right hip arthroplasty intact and unchanged. There is diffuse osteopenia present. There is no acute fracture or dislocation. There are surgical clips over the right pelvis. IMPRESSION: No acute findings. Bilateral hip arthroplasties intact. Electronically Signed   By: Marin Olp M.D.   On: 05/03/2019 15:22   DG C-Arm 1-60 Min-No Report  Result Date: 05/03/2019 Fluoroscopy was utilized by the requesting physician.  No radiographic interpretation.   DG HIP OPERATIVE UNILAT W OR W/O PELVIS RIGHT  Result Date: 05/03/2019 CLINICAL DATA:  Right hip replacement. EXAM:  OPERATIVE RIGHT HIP (WITH PELVIS IF PERFORMED) 2 VIEWS TECHNIQUE: Fluoroscopic spot image(s) were submitted for interpretation post-operatively. COMPARISON:  04/30/2019. FINDINGS: Total right hip replacement with anatomic alignment. Hardware intact. No acute bony abnormality. Prior total left hip replacement. IMPRESSION: Total right hip replacement with anatomic alignment. Electronically Signed   By: Marcello Moores  Register   On: 05/03/2019 14:37        Scheduled Meds: . acetaminophen  500 mg Oral Q6H  . apixaban  2.5 mg Oral BID  . Chlorhexidine Gluconate Cloth  6 each Topical Daily  . docusate sodium  100 mg Oral BID  . donepezil  5 mg Oral QHS  . fluorometholone  1 drop Both Eyes QID  . fluticasone  2 spray Each Nare QHS  . Lifitegrast  1 drop Both Eyes BID  . metoprolol succinate  25 mg Oral QHS  . mirabegron ER  50 mg Oral Daily  . pravastatin  20 mg Oral q1800  . senna-docusate  1 tablet Oral QHS  . torsemide  20 mg Oral Daily   Continuous Infusions: . sodium chloride 75 mL/hr at 05/04/19 1000  . methocarbamol (ROBAXIN) IV       LOS: 4 days    Time spent: 25 mins.More than 50% of that time was spent in counseling and/or coordination of care.      Shelly Coss, MD [facility] Pager [phone]  If 7PM-7AM, please contact night-coverage [email or web address] Password [facility] 05/04/2019, 1:41 PM

## 2019-05-04 NOTE — Progress Notes (Signed)
Subjective: 1 Day Post-Op Procedure(s) (LRB): RIGHT HEMI HIP ARTHROPLASTY ANTERIOR APPROACH (Right) Patient reports pain as mild.    Objective: Vital signs in last 24 hours: Temp:  [97.5 F (36.4 C)-98.4 F (36.9 C)] 98.4 F (36.9 C) (01/22 0600) Pulse Rate:  [64-77] 66 (01/22 0600) Resp:  [12-20] 18 (01/22 0600) BP: (124-165)/(58-93) 128/58 (01/22 0600) SpO2:  [92 %-100 %] 97 % (01/22 0600)  Intake/Output from previous day: 01/21 0701 - 01/22 0700 In: 2653.6 [P.O.:360; I.V.:1793.6; IV Piggyback:300] Out: 1125 [Urine:950; Blood:175] Intake/Output this shift: No intake/output data recorded.  Recent Labs    05/02/19 0509 05/04/19 0553  HGB 11.7* 12.0*   Recent Labs    05/02/19 0509 05/04/19 0553  WBC 8.8 11.6*  RBC 3.70* 3.77*  HCT 37.1* 38.1*  PLT 222 240   Recent Labs    05/04/19 0553  NA 142  K 5.1  CL 104  CO2 28  BUN 26*  CREATININE 0.93  GLUCOSE 145*  CALCIUM 9.5   No results for input(s): LABPT, INR in the last 72 hours.  Neurologically intact Neurovascular intact Sensation intact distally Intact pulses distally Dorsiflexion/Plantar flexion intact Incision: no drainage No cellulitis present Compartment soft   Assessment/Plan: 1 Day Post-Op Procedure(s) (LRB): RIGHT HEMI HIP ARTHROPLASTY ANTERIOR APPROACH (Right) Up with therapy  WBAT RLE ABLA- mild and stable Continue with Eliquis for dvt ppx F/u with Dr. Erlinda Hong 2 weeks post-op for suture removal       Jeremy Kelley 05/04/2019, 8:20 AM

## 2019-05-04 NOTE — Evaluation (Signed)
Occupational Therapy Evaluation Patient Details Name: Jeremy Kelley MRN: [id number] DOB: February 02, 1927 Today's Date: 05/04/2019    History of Present Illness Patient is a 86 year old male with history of hypertension, hyperlipidemia, Chronic dCHF, pulmonary hypertension, chronic A. fib on anticoagulation, carotid artery stenosis bilaterally, chronic back pain, urethral carcinoma, prostate cancer, recent closed fracture of left tibia status post hemiarthroplasty on 03/26/2019 who presents after having  fall at home.  X-ray of the right pelvis showed acute displaced femoral neck fracture.  Orthopedics consulted.  Underwent right hemihip arthroplasty on 05/03/2019 per Dr Erlinda Hong.   Clinical Impression   Pt admitted s/p fall with femoral  fracture . Pt currently with functional limitations due to the deficits listed below (see OT Problem List).  Pt will benefit from skilled OT to increase their safety and independence with ADL and functional mobility for ADL to facilitate discharge to venue listed below.      Follow Up Recommendations  SNF;Home health OT;Supervision/Assistance - 24 hour(only home is 24/7 assist)    Equipment Recommendations  None recommended by OT    Recommendations for Other Services       Precautions / Restrictions Precautions Precautions: Fall Restrictions Weight Bearing Restrictions: No Other Position/Activity Restrictions: WBAT      Mobility Bed Mobility Overal bed mobility: Needs Assistance Bed Mobility: Supine to Sit     Supine to sit: Mod assist;+2 for safety/equipment        Transfers Overall transfer level: Needs assistance Equipment used: Rolling walker (2 wheeled) Transfers: Sit to/from [facility] Sit to Stand: Mod assist Stand pivot transfers: +2 safety/equipment;Mod assist            Balance Overall balance assessment: History of Falls;Needs assistance Sitting-balance support: No upper extremity supported Sitting  balance-Leahy Scale: Fair     Standing balance support: Bilateral upper extremity supported Standing balance-Leahy Scale: Poor                             ADL either performed or assessed with clinical judgement   ADL Overall ADL's : Needs assistance/impaired Eating/Feeding: Set up;Sitting   Grooming: Wash/dry face;Oral care;Wash/dry hands;Minimal assistance;Sitting   Upper Body Bathing: Minimal assistance;Sitting   Lower Body Bathing: Sit to/from stand;Moderate assistance;Cueing for safety;Cueing for compensatory techniques;+2 for safety/equipment   Upper Body Dressing : Minimal assistance;Sitting   Lower Body Dressing: Moderate assistance;+2 for safety/equipment;Sit to/from stand;Cueing for compensatory techniques;Cueing for safety   Toilet Transfer: Moderate assistance;+2 for safety/equipment   Toileting- Clothing Manipulation and Hygiene: Moderate assistance;+2 for safety/equipment;Sit to/from stand         General ADL Comments: pt will need 24/7 A upon DC .  SNF or ALF with 24/7 A. Pt not safe to Dc home     Vision Patient Visual Report: No change from baseline              Pertinent Vitals/Pain Pain Assessment: No/denies pain     Hand Dominance     Extremity/Trunk Assessment Upper Extremity Assessment Upper Extremity Assessment: Generalized weakness           Communication     Cognition Arousal/Alertness: Awake/alert Behavior During Therapy: WFL for tasks assessed/performed Overall Cognitive Status: Within Functional Limits for tasks assessed                                 General Comments: followed all commands. Appeared in  WFL but feels like the teeth in his room are not his. Unsure if pt confused ?   General Comments               Home Living Family/patient expects to be discharged to:: Skilled nursing facility                                                 OT Problem List: Decreased  strength;Decreased activity tolerance;Impaired balance (sitting and/or standing);Decreased safety awareness;Decreased knowledge of use of DME or AE      OT Treatment/Interventions: Self-care/ADL training;Patient/family education;DME and/or AE instruction    OT Goals(Current goals can be found in the care plan section) Acute Rehab OT Goals Patient Stated Goal: get well OT Goal Formulation: With patient Time For Goal Achievement: 05/11/19 Potential to Achieve Goals: Good ADL Goals Pt Will Perform Grooming: with supervision;standing Pt Will Perform Lower Body Dressing: with supervision;sit to/from stand Pt Will Transfer to Toilet: with supervision;bedside commode;stand pivot transfer Pt Will Perform Toileting - Clothing Manipulation and hygiene: with supervision;sit to/from stand  OT Frequency: Min 2X/week   Barriers to D/C: Decreased caregiver support          Co-evaluation PT/OT/SLP Co-Evaluation/Treatment: Yes Reason for Co-Treatment: To address functional/ADL transfers PT goals addressed during session: Mobility/safety with mobility;Proper use of DME OT goals addressed during session: ADL's and self-care      AM-PAC OT "6 Clicks" Daily Activity     Outcome Measure Help from another person eating meals?: None Help from another person taking care of personal grooming?: A Little Help from another person toileting, which includes using toliet, bedpan, or urinal?: A Lot Help from another person bathing (including washing, rinsing, drying)?: A Little Help from another person to put on and taking off regular upper body clothing?: A Lot Help from another person to put on and taking off regular lower body clothing?: A Lot 6 Click Score: 16   End of Session Equipment Utilized During Treatment: Rolling walker Nurse Communication: Mobility status  Activity Tolerance: Patient tolerated treatment well Patient left: in chair  OT Visit Diagnosis: Unsteadiness on feet (R26.81);Other  abnormalities of gait and mobility (R26.89);Muscle weakness (generalized) (M62.81);History of falling (Z91.81);Repeated falls (R29.6)                Time: [id number] OT Time Calculation (min): 35 min Charges:  OT General Charges $OT Visit: 1 Visit OT Evaluation $OT Eval Moderate Complexity: 1 Mod  Kari Baars, OT Acute Rehabilitation Services [phone] Office- [phone], Edwena Felty D 05/04/2019, 2:48 PM

## 2019-05-04 NOTE — NC FL2 (Signed)
[place] LEVEL OF CARE SCREENING TOOL     IDENTIFICATION  Patient Name: Jeremy Kelley Birthdate: 01/17/1927 Sex: male Admission Date (Current Location): 04/30/2019  [facility] and [place] Number:  Herbalist and Address:  [facility],  [place] [address] [place]      Provider Number: [id number]  Attending Physician Name and Address:  Shelly Coss, MD  Relative Name and Phone Number:  [name], step-son, [phone]    Current Level of Care: Hospital Recommended Level of Care: [place] Prior Approval Number:    Date Approved/Denied:   PASRR Number: [id number]  Discharge Plan: SNF    Current Diagnoses: Patient Active Problem List   Diagnosis Date Noted  . Pressure injury of skin 05/02/2019  . Displaced fracture of right femoral neck ([place]) 04/30/2019  . DNR (do not resuscitate) 04/30/2019  . Fall at home, initial encounter 04/30/2019  . History of hemiarthroplasty of left hip 04/25/2019  . Hip fracture ([place]) 04/21/2019  . Dementia ([place]) 04/08/2019  . S/P hip hemiarthroplasty   . Small bowel obstruction ([place]) 02/21/2019  . Closed pilon fracture, left, sequela 10/11/2016  . Pain in left ankle and joints of left foot 10/04/2016  . Idiopathic chronic venous hypertension of left lower extremity with ulcer and inflammation ([place]) 10/04/2016  . Fatigue 11/04/2015  . CAD (coronary artery disease) 08/01/2015  . Pulmonary hypertension ([place]) 07/24/2015  . Atrial fibrillation, chronic ([place]) 07/16/2015  . Aortic insufficiency 01/04/2014  . Squamous cell carcinoma in situ of skin of right lower leg 11/13/2013  . Lentigo maligna of cheek ([place]) 09/19/2013  . Actinic keratosis 11/06/2012  . Macular degeneration 08/10/2012  . Allergic rhinitis 08/10/2012  . Bladder cancer ([place]) 10/19/2011  . Eczema 02/23/2011  . Hyperglycemia 02/11/2011  . Heart murmur 09/10/2010  . ABNORMAL THYROID FUNCTION TESTS  08/05/2009  . ABNORMAL HEART RHYTHMS 07/11/2009  . Hyperlipidemia 11/28/2008  . CAROTID BRUIT 11/19/2008  . UNSPECIFIED CATARACT 10/17/2008  . PARESTHESIA 12/04/2007  . Varicose veins of bilateral lower extremities with other complications [id number]  . COLONIC POLYPS, HX OF 08/28/2007  . Essential hypertension 02/08/2007  . PROSTATE CANCER, HX OF 11/04/2006  . INGUINAL HERNIA, HX OF 11/04/2006    Orientation RESPIRATION BLADDER Height & Weight     Self, Situation, Place  Normal External catheter Weight: 55.2 kg Height:  5\' 9"  (175.3 cm)  BEHAVIORAL SYMPTOMS/MOOD NEUROLOGICAL BOWEL NUTRITION STATUS  (none) (none) Continent Diet(see d/c summary)  AMBULATORY STATUS COMMUNICATION OF NEEDS Skin   Extensive Assist Verbally Surgical wounds(R Hip)                       Personal Care Assistance Level of Assistance  Bathing, Feeding, Dressing Bathing Assistance: Maximum assistance Feeding assistance: Independent Dressing Assistance: Limited assistance     Functional Limitations Info  Sight, Hearing, Speech Sight Info: Adequate Hearing Info: Adequate Speech Info: Adequate    SPECIAL CARE FACTORS FREQUENCY  PT (By licensed PT), OT (By licensed OT)     PT Frequency: 5X/W OT Frequency: 5X/W            Contractures Contractures Info: Not present    Additional Factors Info  Code Status, Allergies Code Status Info: DNR Allergies Info: NKA           Current Medications (05/04/2019):  This is the current hospital active medication list Current Facility-Administered Medications  Medication Dose Route Frequency Provider Last Rate Last Admin  .  0.9 %  sodium chloride infusion   Intravenous Continuous Leandrew Koyanagi, MD 75 mL/hr at 05/04/19 1000 Rate Verify at 05/04/19 1000  . acetaminophen (TYLENOL) tablet 325-650 mg  325-650 mg Oral Q6H PRN Leandrew Koyanagi, MD      . acetaminophen (TYLENOL) tablet 500 mg  500 mg Oral Q6H Leandrew Koyanagi, MD   500 mg at 05/04/19 1030  .  alum & mag hydroxide-simeth (MAALOX/MYLANTA) 200-200-20 MG/5ML suspension 30 mL  30 mL Oral Q4H PRN Leandrew Koyanagi, MD      . apixaban Arne [place]) tablet 2.5 mg  2.5 mg Oral BID Leandrew Koyanagi, MD   2.5 mg at 05/04/19 1030  . Chlorhexidine Gluconate Cloth 2 % PADS 6 each  6 each Topical Daily Leandrew Koyanagi, MD      . docusate sodium (COLACE) capsule 100 mg  100 mg Oral BID Leandrew Koyanagi, MD   100 mg at 05/04/19 1030  . donepezil (ARICEPT) tablet 5 mg  5 mg Oral QHS Leandrew Koyanagi, MD   5 mg at 05/03/19 2119  . fluorometholone (FML) 0.1 % ophthalmic suspension 1 drop  1 drop Both Eyes QID Leandrew Koyanagi, MD   1 drop at 05/04/19 1358  . fluticasone (FLONASE) 50 MCG/ACT nasal spray 2 spray  2 spray Each Nare QHS Leandrew Koyanagi, MD   2 spray at 05/02/19 2238  . HYDROcodone-acetaminophen (NORCO) 7.5-325 MG per tablet 1-2 tablet  1-2 tablet Oral Q4H PRN Leandrew Koyanagi, MD      . HYDROcodone-acetaminophen (NORCO/VICODIN) 5-325 MG per tablet 1-2 tablet  1-2 tablet Oral Q4H PRN Leandrew Koyanagi, MD      . ipratropium (ATROVENT) 0.06 % nasal spray 2 spray  2 spray Nasal BID PRN Leandrew Koyanagi, MD      . Lifitegrast 5 % SOLN 1 drop  1 drop Both Eyes BID Leandrew Koyanagi, MD      . magnesium citrate solution 1 Bottle  1 Bottle Oral Once PRN Leandrew Koyanagi, MD      . menthol-cetylpyridinium (CEPACOL) lozenge 3 mg  1 lozenge Oral PRN Leandrew Koyanagi, MD       Or  . phenol (CHLORASEPTIC) mouth spray 1 spray  1 spray Mouth/Throat PRN Leandrew Koyanagi, MD      . methocarbamol (ROBAXIN) tablet 500 mg  500 mg Oral Q6H PRN Leandrew Koyanagi, MD   500 mg at 05/04/19 1030   Or  . methocarbamol (ROBAXIN) 500 mg in dextrose 5 % 50 mL IVPB  500 mg Intravenous Q6H PRN Leandrew Koyanagi, MD      . metoprolol succinate (TOPROL-XL) 24 hr tablet 25 mg  25 mg Oral QHS Leandrew Koyanagi, MD   25 mg at 05/02/19 2240  . mirabegron ER (MYRBETRIQ) tablet 50 mg  50 mg Oral Daily Leandrew Koyanagi, MD   50 mg at 05/04/19 1030  . morphine 2 MG/ML injection 1 mg  1 mg  Intravenous Q2H PRN Leandrew Koyanagi, MD      . ondansetron [facility]) tablet 4 mg  4 mg Oral Q6H PRN Leandrew Koyanagi, MD       Or  . ondansetron [facility]) injection 4 mg  4 mg Intravenous Q6H PRN Leandrew Koyanagi, MD      . polyethylene glycol (MIRALAX / GLYCOLAX) packet 17 g  17 g Oral Daily PRN Leandrew Koyanagi, MD   17 g at 05/04/19 1030  .  pravastatin (PRAVACHOL) tablet 20 mg  20 mg Oral q1800 Leandrew Koyanagi, MD   20 mg at 05/02/19 1819  . senna-docusate (Senokot-S) tablet 1 tablet  1 tablet Oral QHS Leandrew Koyanagi, MD   1 tablet at 05/01/19 2102  . sorbitol 70 % solution 30 mL  30 mL Oral Daily PRN Leandrew Koyanagi, MD      . torsemide [facility]) tablet 20 mg  20 mg Oral Daily Leandrew Koyanagi, MD   20 mg at 05/04/19 1030     Discharge Medications: Please see discharge summary for a list of discharge medications.  Relevant Imaging Results:  Relevant Lab Results:   Additional Information [address], [place]

## 2019-05-04 NOTE — Progress Notes (Signed)
Paged MD Dr. Silas [place] about patient only urinating 200 mL. Dr. Paged back and advised to keep the foley  in until the day team reevaluates.

## 2019-05-05 ENCOUNTER — Encounter (HOSPITAL_COMMUNITY): Payer: Self-pay | Admitting: Internal Medicine

## 2019-05-05 LAB — CBC
HCT: 33.4 % — ABNORMAL LOW (ref 39.0–52.0)
Hemoglobin: 10.6 g/dL — ABNORMAL LOW (ref 13.0–17.0)
MCH: 31.9 pg (ref 26.0–34.0)
MCHC: 31.7 g/dL (ref 30.0–36.0)
MCV: 100.6 fL — ABNORMAL HIGH (ref 80.0–100.0)
Platelets: 227 10*3/uL (ref 150–400)
RBC: 3.32 MIL/uL — ABNORMAL LOW (ref 4.22–5.81)
RDW: 13.3 % (ref 11.5–15.5)
WBC: 9.2 10*3/uL (ref 4.0–10.5)
nRBC: 0 % (ref 0.0–0.2)

## 2019-05-05 LAB — BASIC METABOLIC PANEL
Anion gap: 7 (ref 5–15)
BUN: 27 mg/dL — ABNORMAL HIGH (ref 8–23)
CO2: 31 mmol/L (ref 22–32)
Calcium: 8.9 mg/dL (ref 8.9–10.3)
Chloride: 102 mmol/L (ref 98–111)
Creatinine, Ser: 0.89 mg/dL (ref 0.61–1.24)
GFR calc Af Amer: >60 mL/min (ref >60)
GFR calc non Af Amer: >60 mL/min (ref >60)
Glucose, Bld: 101 mg/dL — ABNORMAL HIGH (ref 70–99)
Potassium: 4.2 mmol/L (ref 3.5–5.1)
Sodium: 140 mmol/L (ref 135–145)

## 2019-05-05 NOTE — Progress Notes (Signed)
Physical Therapy Treatment Patient Details Name: Jeremy Kelley MRN: [id number] DOB: [birth year] Today's Date: 05/05/2019    History of Present Illness Patient is a 86 year old male with history of hypertension, hyperlipidemia, Chronic dCHF, pulmonary hypertension, chronic A. fib on anticoagulation, carotid artery stenosis bilaterally, chronic back pain, urethral carcinoma, prostate cancer, recent closed fracture of left tibia status post hemiarthroplasty on 03/26/2019 who presents after having  fall at home.  X-ray of the right pelvis showed acute displaced femoral neck fracture.  Orthopedics consulted.  Underwent right hemihip arthroplasty on 05/03/2019 per Dr Erlinda Hong.    PT Comments    POD # 2 Pt in bed easily arouse. General Comments: following all commands and pleasant.  Assisted OOB to amb with + 2 Assist.   General bed mobility comments: "Carefully and slowly", per pt assisted from supine to EOB with caution to avoid shearing as pt c/o "butt sore".  Initial posterior lean, required Mod Assist to correct.  Once upright, pt was able to static sit EOB x 4 min.  Also had c/o R shoulder pain and sensitivity to touch B UE's. General transfer comment: from elevated bed to upright postion on B Platform EVA (for increased support) and 50% VC's on turn completion back to bed.  General Gait Details: used b Platform EVA walker for increased support and to increase gait distance as well as upright activity.  Very slow short shuffled gait with tight feet together.  Required increased assist to complete turns completely and weight shift onto R LE.  Tolerated distance well. Assisted back to bed and positioned LEFT sidelying to comfort.    Follow Up Recommendations  SNF     Equipment Recommendations  None recommended by PT    Recommendations for Other Services       Precautions / Restrictions Precautions Precautions: Fall Restrictions Weight Bearing Restrictions: No Other Position/Activity  Restrictions: WBAT    Mobility  Bed Mobility Overal bed mobility: Needs Assistance Bed Mobility: Supine to Sit;Sit to Supine     Supine to sit: Max assist;+2 for physical assistance;+2 for safety/equipment Sit to supine: Max assist;Total assist;+2 for physical assistance;+2 for safety/equipment   General bed mobility comments: "Carefully and slowly", per pt assisted from supine to EOB with caution to avoid shearing as pt c/o "butt sore".  Initial posterior lean, required Mod Assist to correct.  Once upright, pt was able to static sit EOB x 4 min.  Also had c/o R shoulder pain and sensitivity to touch B UE's.  Transfers Overall transfer level: Needs assistance Equipment used: Bilateral platform walker(EVA walker) Transfers: Sit to/from [employer] Transfers Sit to Stand: Mod assist;+2 physical assistance;+2 safety/equipment Stand pivot transfers: Max assist;Mod assist;+2 physical assistance;+2 safety/equipment       General transfer comment: from elevated bed to upright postion on B Platform EVA (for increased support) and 50% VC's on turn completion back to bed  Ambulation/Gait Ambulation/Gait assistance: Min assist;Mod assist;+2 physical assistance;+2 safety/equipment Gait Distance (Feet): 26 Feet Assistive device: Bilateral platform walker(EVA walker) Gait Pattern/deviations: Step-to pattern;Decreased step length - right;Decreased step length - left;Narrow base of support Gait velocity: decreased   General Gait Details: used b Platform EVA walker for increased support and to increase gait distance as well as upright activity.  Very slow short shuffled gait with tight feet together.  Required increased assist to complete turns completely and weight shift onto R LE.  Tolerated distance well.   Stairs  Wheelchair Mobility    Modified Rankin (Stroke Patients Only)       Balance                                            Cognition  Arousal/Alertness: Awake/alert Behavior During Therapy: WFL for tasks assessed/performed Overall Cognitive Status: Within Functional Limits for tasks assessed                                 General Comments: following all commands and pleasant      Exercises      General Comments        Pertinent Vitals/Pain Pain Assessment: Faces Faces Pain Scale: Hurts even more Pain Location: R hip with activity Pain Descriptors / Indicators: Discomfort;Grimacing Pain Intervention(s): Monitored during session;Repositioned    Home Living                      Prior Function            PT Goals (current goals can now be found in the care plan section) Progress towards PT goals: Progressing toward goals    Frequency    Min 3X/week      PT Plan Current plan remains appropriate    Co-evaluation              AM-PAC PT "6 Clicks" Mobility   Outcome Measure  Help needed turning from your back to your side while in a flat bed without using bedrails?: A Lot Help needed moving from lying on your back to sitting on the side of a flat bed without using bedrails?: A Lot Help needed moving to and from a bed to a chair (including a wheelchair)?: A Lot Help needed standing up from a chair using your arms (e.g., wheelchair or bedside chair)?: A Lot Help needed to walk in hospital room?: A Lot Help needed climbing 3-5 steps with a railing? : Total 6 Click Score: 11    End of Session Equipment Utilized During Treatment: Gait belt Activity Tolerance: Patient tolerated treatment well;Patient limited by fatigue Patient left: in bed;with bed alarm set(sidelying onto LEFT) Nurse Communication: Mobility status PT Visit Diagnosis: Difficulty in walking, not elsewhere classified (R26.2)     Time: 14:47 - 15:13 PT Time Calculation (min) (ACUTE ONLY): 24 min  Charges:  $Gait Training: 8-22 mins $Therapeutic Activity: 8-22 mins                     Rica Koyanagi   PTA Acute  Rehabilitation Services Pager      [phone] Office      [phone]

## 2019-05-05 NOTE — Progress Notes (Signed)
PROGRESS NOTE    Jeremy Kelley  [id number] DOB: 1926/05/03 DOA: 04/30/2019 PCP: [name], NP   Brief Narrative: Patient is a 86 year old Caucasian male with past medical history significant for hypertension, hyperlipidemia, Chronic diastolic CHF, pulmonary hypertension, chronic A. fib on anticoagulation, chronic carotid artery stenosis bilaterally, chronic back pain, urethral carcinoma, prostate cancer, recent closed fracture of left tibia status post hemiarthroplasty on 03/26/2019 who presents after having  fall at home.  X-ray of the right pelvis showed acute displaced humeral neck fracture.  Orthopedics consulted.  Underwent right hemihip arthroplasty on 05/03/2019.  PT/OT evaluation pending.  Most likely he will need skilled nursing facility on discharge.  05/05/2019: Patient seen.  No new changes.  Patient is awaiting discharge.  Assessment & Plan:   Principal Problem:   Displaced fracture of right femoral neck (HCC) Active Problems:   Hyperlipidemia   Essential hypertension   Atrial fibrillation, chronic (HCC)   Pulmonary hypertension (HCC)   CAD (coronary artery disease)   Dementia ([place])   DNR (do not resuscitate)   Fall at home, initial encounter   Pressure injury of skin   Displaced right femoral fracture secondary to mechanical fall: Orthopedics following.  Continue pain management. Underwent right hemihip arthroplasty on 05/03/2019.  PT/OT evaluation pending.  Most likely he will need skilled nursing facility on discharge. 05/05/2019: Pursue disposition.  Chronic A. fib: On Eliquis at home.Resumed.  On metoprolol for rate control.  .  Mild dementia: On Aricept.  Mostly alert and oriented.  Hypertension: Blood pressure currently stable.  Continue current regimen  Pulmonary hypertension with diastolic CHF: Last echocardiogram done in January 2020 showed preserved ejection fraction of 55 to 60%.  Appears euvolemic.  On torsemide 20 mg daily which we will  continue  Hyperlipidemia: Continue statin  History of urethral/prostate cancer: Currently in remission  CODE STATUS: DNR  DVT prophylaxis:Lovenox Code Status: DNR Family Communication:  Disposition Plan: SNF.  Consultants: Orthopedics  Procedures: None  Antimicrobials:  Anti-infectives (From admission, onward)   Start     Dose/Rate Route Frequency Ordered Stop   05/03/19 2000  ceFAZolin (ANCEF) IVPB 2g/100 mL premix     2 g 200 mL/hr over 30 Minutes Intravenous Every 6 hours 05/03/19 1657 05/04/19 1154   05/03/19 1354  vancomycin (VANCOCIN) powder  Status:  Discontinued       As needed 05/03/19 1355 05/03/19 1630   05/03/19 1030  ceFAZolin (ANCEF) IVPB 2g/100 mL premix  Status:  Discontinued     2 g 200 mL/hr over 30 Minutes Intravenous On call to O.R. 05/03/19 1016 05/03/19 1021   05/03/19 0600  ceFAZolin (ANCEF) IVPB 2g/100 mL premix    Note to Pharmacy: Anesthesia to give preop   2 g 200 mL/hr over 30 Minutes Intravenous On call to O.R. 05/02/19 1830 05/03/19 1315      Subjective: No new complaints  Objective: Vitals:   05/04/19 1510 05/04/19 1949 05/05/19 0037 05/05/19 0512  BP: 123/70 (!) 111/52 118/65 125/67  Pulse: 70 72 77 70  Resp: 16 17 17 17   Temp: 98 F (36.7 C) 98 F (36.7 C) 98.5 F (36.9 C) 98.3 F (36.8 C)  TempSrc:  Oral Oral Oral  SpO2: 94% 96% 92% 92%  Weight:      Height:        Intake/Output Summary (Last 24 hours) at 05/05/2019 1131 Last data filed at 05/05/2019 0159 Gross per 24 hour  Intake 1325.85 ml  Output 1350 ml  Net -24.15 ml  Filed Weights   04/30/19 2239 05/03/19 0533  Weight: 57.1 kg 55.2 kg    Examination:   General exam: Cachectic  Respiratory system: Adequate air entry Cardiovascular system: S1S2 Gastrointestinal system: Abdomen is nondistended, soft and nontender.  Normal bowel sounds heard. Central nervous system: Awake and alert.  Patient moves all extremites  Extremities: No edema    Data Reviewed: I  have personally reviewed following labs and imaging studies  CBC: Recent Labs  Lab 04/30/19 1015 05/01/19 0527 05/02/19 0509 05/04/19 0553 05/05/19 0635  WBC 10.2 9.1 8.8 11.6* 9.2  NEUTROABS 8.9*  --  7.0 10.0*  --   HGB 13.0 12.5* 11.7* 12.0* 10.6*  HCT 40.9 39.5 37.1* 38.1* 33.4*  MCV 100.0 100.3* 100.3* 101.1* 100.6*  PLT 262 228 222 240 [id number]   Basic Metabolic Panel: Recent Labs  Lab 04/30/19 1015 05/01/19 0527 05/04/19 0553 05/05/19 0635  NA 141 142 142 140  K 4.5 4.1 5.1 4.2  CL 105 106 104 102  CO2 27 28 28 31   GLUCOSE 111* 100* 145* 101*  BUN 20 20 26* 27*  CREATININE 0.83 1.08 0.93 0.89  CALCIUM 10.1 9.6 9.5 8.9   GFR: Estimated Creatinine Clearance: 43.1 mL/min (by C-G formula based on SCr of 0.89 mg/dL). Liver Function Tests: No results for input(s): AST, ALT, ALKPHOS, BILITOT, PROT, ALBUMIN in the last 168 hours. No results for input(s): LIPASE, AMYLASE in the last 168 hours. No results for input(s): AMMONIA in the last 168 hours. Coagulation Profile: Recent Labs  Lab 04/30/19 1015  INR 1.3*   Cardiac Enzymes: No results for input(s): CKTOTAL, CKMB, CKMBINDEX, TROPONINI in the last 168 hours. BNP (last 3 results) Recent Labs    08/31/18 1247  PROBNP 459.0*   HbA1C: No results for input(s): HGBA1C in the last 72 hours. CBG: No results for input(s): GLUCAP in the last 168 hours. Lipid Profile: No results for input(s): CHOL, HDL, LDLCALC, TRIG, CHOLHDL, LDLDIRECT in the last 72 hours. Thyroid Function Tests: No results for input(s): TSH, T4TOTAL, FREET4, T3FREE, THYROIDAB in the last 72 hours. Anemia Panel: No results for input(s): VITAMINB12, FOLATE, FERRITIN, TIBC, IRON, RETICCTPCT in the last 72 hours. Sepsis Labs: No results for input(s): PROCALCITON, LATICACIDVEN in the last 168 hours.  Recent Results (from the past 240 hour(s))  Respiratory Panel by RT PCR (Flu A&B, Covid) - Nasopharyngeal Swab     Status: None   Collection Time:  04/30/19 10:13 AM   Specimen: Nasopharyngeal Swab  Result Value Ref Range Status   SARS Coronavirus 2 by RT PCR NEGATIVE NEGATIVE Final    Comment: (NOTE) SARS-CoV-2 target nucleic acids are NOT DETECTED. The SARS-CoV-2 RNA is generally detectable in upper respiratoy specimens during the acute phase of infection. The lowest concentration of SARS-CoV-2 viral copies this assay can detect is 131 copies/mL. A negative result does not preclude SARS-Cov-2 infection and should not be used as the sole basis for treatment or other patient management decisions. A negative result may occur with  improper specimen collection/handling, submission of specimen other than nasopharyngeal swab, presence of viral mutation(s) within the areas targeted by this assay, and inadequate number of viral copies (<131 copies/mL). A negative result must be combined with clinical observations, patient history, and epidemiological information. The expected result is Negative. Fact Sheet for Patients:  PinkCheek.be Fact Sheet for Healthcare Providers:  GravelBags.it This test is not yet ap proved or cleared by the [place] FDA and  has been authorized for detection and/or  diagnosis of SARS-CoV-2 by FDA under an Emergency Use Authorization (EUA). This EUA will remain  in effect (meaning this test can be used) for the duration of the COVID-19 declaration under Section 564(b)(1) of the Act, 21 U.S.C. section 360bbb-3(b)(1), unless the authorization is terminated or revoked sooner.    Influenza A by PCR NEGATIVE NEGATIVE Final   Influenza B by PCR NEGATIVE NEGATIVE Final    Comment: (NOTE) The Xpert Xpress SARS-CoV-2/FLU/RSV assay is intended as an aid in  the diagnosis of influenza from Nasopharyngeal swab specimens and  should not be used as a sole basis for treatment. Nasal washings and  aspirates are unacceptable for Xpert Xpress SARS-CoV-2/FLU/RSV   testing. Fact Sheet for Patients: PinkCheek.be Fact Sheet for Healthcare Providers: GravelBags.it This test is not yet approved or cleared by the [place] FDA and  has been authorized for detection and/or diagnosis of SARS-CoV-2 by  FDA under an Emergency Use Authorization (EUA). This EUA will remain  in effect (meaning this test can be used) for the duration of the  Covid-19 declaration under Section 564(b)(1) of the Act, 21  U.S.C. section 360bbb-3(b)(1), unless the authorization is  terminated or revoked. Performed at [facility], [place] [address], [place], [place] [address]   Urine culture     Status: Abnormal   Collection Time: 05/03/19  3:59 PM   Specimen: Urine, Catheterized  Result Value Ref Range Status   Specimen Description   Final    URINE, CATHETERIZED Performed at [facility] [address], [place], [place] [address]    Special Requests   Final    NONE Performed at [facility], [place] [address], [place], [place] [address]    Culture MULTIPLE SPECIES PRESENT, SUGGEST RECOLLECTION (A)  Final   Report Status 05/04/2019 FINAL  Final         Radiology Studies: Pelvis Portable  Result Date: 05/03/2019 CLINICAL DATA:  Right total hip replacement. EXAM: PORTABLE PELVIS 1-2 VIEWS COMPARISON:  Earlier same day as well as 03/26/2019 FINDINGS: Left hip arthroplasty intact and unchanged. Evidence of patient's recent right hip arthroplasty intact and unchanged. There is diffuse osteopenia present. There is no acute fracture or dislocation. There are surgical clips over the right pelvis. IMPRESSION: No acute findings. Bilateral hip arthroplasties intact. Electronically Signed   By: Marin Olp M.D.   On: 05/03/2019 15:22   DG C-Arm 1-60 Min-No Report  Result Date: 05/03/2019 Fluoroscopy was utilized by the requesting physician.  No radiographic  interpretation.   DG HIP OPERATIVE UNILAT W OR W/O PELVIS RIGHT  Result Date: 05/03/2019 CLINICAL DATA:  Right hip replacement. EXAM: OPERATIVE RIGHT HIP (WITH PELVIS IF PERFORMED) 2 VIEWS TECHNIQUE: Fluoroscopic spot image(s) were submitted for interpretation post-operatively. COMPARISON:  04/30/2019. FINDINGS: Total right hip replacement with anatomic alignment. Hardware intact. No acute bony abnormality. Prior total left hip replacement. IMPRESSION: Total right hip replacement with anatomic alignment. Electronically Signed   By: Marcello Moores  Register   On: 05/03/2019 14:37        Scheduled Meds:  apixaban  2.5 mg Oral BID   Chlorhexidine Gluconate Cloth  6 each Topical Daily   docusate sodium  100 mg Oral BID   donepezil  5 mg Oral QHS   fluorometholone  1 drop Both Eyes QID   fluticasone  2 spray Each Nare QHS   Lifitegrast  1 drop Both Eyes BID   metoprolol succinate  25 mg Oral QHS   mirabegron ER  50 mg Oral Daily   pravastatin  20 mg Oral q1800   senna-docusate  1 tablet Oral QHS   torsemide  20 mg Oral Daily   Continuous Infusions:  sodium chloride 75 mL/hr at 05/04/19 1000   methocarbamol (ROBAXIN) IV       LOS: 5 days    Time spent: 25 mins.More than 50% of that time was spent in counseling and/or coordination of care.      Bonnell Public, MD [facility]  If 7PM-7AM, please contact night-coverage [email or web address] Password [facility] 05/05/2019, 11:31 AM

## 2019-05-06 LAB — CBC
HCT: 33.8 % — ABNORMAL LOW (ref 39.0–52.0)
Hemoglobin: 10.7 g/dL — ABNORMAL LOW (ref 13.0–17.0)
MCH: 31.4 pg (ref 26.0–34.0)
MCHC: 31.7 g/dL (ref 30.0–36.0)
MCV: 99.1 fL (ref 80.0–100.0)
Platelets: 239 10*3/uL (ref 150–400)
RBC: 3.41 MIL/uL — ABNORMAL LOW (ref 4.22–5.81)
RDW: 13.4 % (ref 11.5–15.5)
WBC: 10.1 10*3/uL (ref 4.0–10.5)
nRBC: 0 % (ref 0.0–0.2)

## 2019-05-06 NOTE — Progress Notes (Signed)
PROGRESS NOTE    Jeremy Kelley  [id number] DOB: 24-Sep-1926 DOA: 04/30/2019 PCP: [name], NP   Brief Narrative: Patient is a 86 year old Caucasian male with past medical history significant for hypertension, hyperlipidemia, Chronic diastolic CHF, pulmonary hypertension, chronic A. fib on anticoagulation, chronic carotid artery stenosis bilaterally, chronic back pain, urethral carcinoma, prostate cancer, recent closed fracture of left tibia status post hemiarthroplasty on 03/26/2019 who presents after having  fall at home.  X-ray of the right pelvis showed acute displaced humeral neck fracture.  Orthopedics consulted.  Underwent right hemihip arthroplasty on 05/03/2019.  PT/OT evaluation pending.  Most likely he will need skilled nursing facility on discharge.  05/06/2019: Patient seen.  No new changes.  Patient is awaiting discharge.  Assessment & Plan:   Principal Problem:   Displaced fracture of right femoral neck (HCC) Active Problems:   Hyperlipidemia   Essential hypertension   Atrial fibrillation, chronic (HCC)   Pulmonary hypertension (HCC)   CAD (coronary artery disease)   Dementia ([place])   DNR (do not resuscitate)   Fall at home, initial encounter   Pressure injury of skin   Displaced right femoral fracture secondary to mechanical fall: Orthopedics following.  Continue pain management. Underwent right hemihip arthroplasty on 05/03/2019.  PT/OT evaluation pending.  Most likely he will need skilled nursing facility on discharge. 05/06/2019: Pursue disposition.  Chronic A. fib: On Eliquis at home.Resumed.  On metoprolol for rate control.  .  Mild dementia: On Aricept.  Mostly alert and oriented.  Hypertension: Blood pressure currently stable.  Continue current regimen  Pulmonary hypertension with diastolic CHF: Last echocardiogram done in January 2020 showed preserved ejection fraction of 55 to 60%.  Appears euvolemic.  On torsemide 20 mg daily which we will  continue  Hyperlipidemia: Continue statin  History of urethral/prostate cancer: Currently in remission  CODE STATUS: DNR  DVT prophylaxis:Lovenox Code Status: DNR Family Communication:  Disposition Plan: SNF.  Consultants: Orthopedics  Procedures: None  Antimicrobials:  Anti-infectives (From admission, onward)   Start     Dose/Rate Route Frequency Ordered Stop   05/03/19 2000  ceFAZolin (ANCEF) IVPB 2g/100 mL premix     2 g 200 mL/hr over 30 Minutes Intravenous Every 6 hours 05/03/19 1657 05/04/19 1154   05/03/19 1354  vancomycin (VANCOCIN) powder  Status:  Discontinued       As needed 05/03/19 1355 05/03/19 1630   05/03/19 1030  ceFAZolin (ANCEF) IVPB 2g/100 mL premix  Status:  Discontinued     2 g 200 mL/hr over 30 Minutes Intravenous On call to O.R. 05/03/19 1016 05/03/19 1021   05/03/19 0600  ceFAZolin (ANCEF) IVPB 2g/100 mL premix    Note to Pharmacy: Anesthesia to give preop   2 g 200 mL/hr over 30 Minutes Intravenous On call to O.R. 05/02/19 1830 05/03/19 1315      Subjective: No new complaints  Objective: Vitals:   05/05/19 1722 05/05/19 2144 05/06/19 0633 05/06/19 0926  BP: 121/71 123/69 (!) 120/54 (!) 109/55  Pulse: 74 68 66 80  Resp: 18 19 18  (!) 22  Temp: 98.9 F (37.2 C) 98.3 F (36.8 C) 98.8 F (37.1 C) 98 F (36.7 C)  TempSrc: Oral Oral Oral   SpO2: 93% 95% 94% 95%  Weight:      Height:        Intake/Output Summary (Last 24 hours) at 05/06/2019 0952 Last data filed at 05/06/2019 0634 Gross per 24 hour  Intake 480 ml  Output 2200 ml  Net -  1720 ml   Filed Weights   04/30/19 2239 05/03/19 0533  Weight: 57.1 kg 55.2 kg    Examination:   General exam: Cachectic  Respiratory system: Adequate air entry Cardiovascular system: S1S2 Gastrointestinal system: Abdomen is nondistended, soft and nontender.  Normal bowel sounds heard. Central nervous system: Awake and alert.  Patient moves all extremites  Extremities: No edema    Data  Reviewed: I have personally reviewed following labs and imaging studies  CBC: Recent Labs  Lab 04/30/19 1015 04/30/19 1015 05/01/19 0527 05/02/19 0509 05/04/19 0553 05/05/19 0635 05/06/19 0542  WBC 10.2   < > 9.1 8.8 11.6* 9.2 10.1  NEUTROABS 8.9*  --   --  7.0 10.0*  --   --   HGB 13.0   < > 12.5* 11.7* 12.0* 10.6* 10.7*  HCT 40.9   < > 39.5 37.1* 38.1* 33.4* 33.8*  MCV 100.0   < > 100.3* 100.3* 101.1* 100.6* 99.1  PLT 262   < > 228 222 240 227 239   < > = values in this interval not displayed.   Basic Metabolic Panel: Recent Labs  Lab 04/30/19 1015 05/01/19 0527 05/04/19 0553 05/05/19 0635  NA 141 142 142 140  K 4.5 4.1 5.1 4.2  CL 105 106 104 102  CO2 27 28 28 31   GLUCOSE 111* 100* 145* 101*  BUN 20 20 26* 27*  CREATININE 0.83 1.08 0.93 0.89  CALCIUM 10.1 9.6 9.5 8.9   GFR: Estimated Creatinine Clearance: 43.1 mL/min (by C-G formula based on SCr of 0.89 mg/dL). Liver Function Tests: No results for input(s): AST, ALT, ALKPHOS, BILITOT, PROT, ALBUMIN in the last 168 hours. No results for input(s): LIPASE, AMYLASE in the last 168 hours. No results for input(s): AMMONIA in the last 168 hours. Coagulation Profile: Recent Labs  Lab 04/30/19 1015  INR 1.3*   Cardiac Enzymes: No results for input(s): CKTOTAL, CKMB, CKMBINDEX, TROPONINI in the last 168 hours. BNP (last 3 results) Recent Labs    08/31/18 1247  PROBNP 459.0*   HbA1C: No results for input(s): HGBA1C in the last 72 hours. CBG: No results for input(s): GLUCAP in the last 168 hours. Lipid Profile: No results for input(s): CHOL, HDL, LDLCALC, TRIG, CHOLHDL, LDLDIRECT in the last 72 hours. Thyroid Function Tests: No results for input(s): TSH, T4TOTAL, FREET4, T3FREE, THYROIDAB in the last 72 hours. Anemia Panel: No results for input(s): VITAMINB12, FOLATE, FERRITIN, TIBC, IRON, RETICCTPCT in the last 72 hours. Sepsis Labs: No results for input(s): PROCALCITON, LATICACIDVEN in the last 168  hours.  Recent Results (from the past 240 hour(s))  Respiratory Panel by RT PCR (Flu A&B, Covid) - Nasopharyngeal Swab     Status: None   Collection Time: 04/30/19 10:13 AM   Specimen: Nasopharyngeal Swab  Result Value Ref Range Status   SARS Coronavirus 2 by RT PCR NEGATIVE NEGATIVE Final    Comment: (NOTE) SARS-CoV-2 target nucleic acids are NOT DETECTED. The SARS-CoV-2 RNA is generally detectable in upper respiratoy specimens during the acute phase of infection. The lowest concentration of SARS-CoV-2 viral copies this assay can detect is 131 copies/mL. A negative result does not preclude SARS-Cov-2 infection and should not be used as the sole basis for treatment or other patient management decisions. A negative result may occur with  improper specimen collection/handling, submission of specimen other than nasopharyngeal swab, presence of viral mutation(s) within the areas targeted by this assay, and inadequate number of viral copies (<131 copies/mL). A negative result must  be combined with clinical observations, patient history, and epidemiological information. The expected result is Negative. Fact Sheet for Patients:  PinkCheek.be Fact Sheet for Healthcare Providers:  GravelBags.it This test is not yet ap proved or cleared by the [place] FDA and  has been authorized for detection and/or diagnosis of SARS-CoV-2 by FDA under an Emergency Use Authorization (EUA). This EUA will remain  in effect (meaning this test can be used) for the duration of the COVID-19 declaration under Section 564(b)(1) of the Act, 21 U.S.C. section 360bbb-3(b)(1), unless the authorization is terminated or revoked sooner.    Influenza A by PCR NEGATIVE NEGATIVE Final   Influenza B by PCR NEGATIVE NEGATIVE Final    Comment: (NOTE) The Xpert Xpress SARS-CoV-2/FLU/RSV assay is intended as an aid in  the diagnosis of influenza from Nasopharyngeal  swab specimens and  should not be used as a sole basis for treatment. Nasal washings and  aspirates are unacceptable for Xpert Xpress SARS-CoV-2/FLU/RSV  testing. Fact Sheet for Patients: PinkCheek.be Fact Sheet for Healthcare Providers: GravelBags.it This test is not yet approved or cleared by the [place] FDA and  has been authorized for detection and/or diagnosis of SARS-CoV-2 by  FDA under an Emergency Use Authorization (EUA). This EUA will remain  in effect (meaning this test can be used) for the duration of the  Covid-19 declaration under Section 564(b)(1) of the Act, 21  U.S.C. section 360bbb-3(b)(1), unless the authorization is  terminated or revoked. Performed at [facility], [place] [address], [place], [place] [address]   Urine culture     Status: Abnormal   Collection Time: 05/03/19  3:59 PM   Specimen: Urine, Catheterized  Result Value Ref Range Status   Specimen Description   Final    URINE, CATHETERIZED Performed at [facility] [address], [place], [place] [address]    Special Requests   Final    NONE Performed at [facility], [place] [address], [place], [place] [address]    Culture MULTIPLE SPECIES PRESENT, SUGGEST RECOLLECTION (A)  Final   Report Status 05/04/2019 FINAL  Final         Radiology Studies: No results found.      Scheduled Meds: . apixaban  2.5 mg Oral BID  . Chlorhexidine Gluconate Cloth  6 each Topical Daily  . docusate sodium  100 mg Oral BID  . donepezil  5 mg Oral QHS  . fluorometholone  1 drop Both Eyes QID  . fluticasone  2 spray Each Nare QHS  . Lifitegrast  1 drop Both Eyes BID  . metoprolol succinate  25 mg Oral QHS  . mirabegron ER  50 mg Oral Daily  . pravastatin  20 mg Oral q1800  . senna-docusate  1 tablet Oral QHS  . torsemide  20 mg Oral Daily   Continuous Infusions: . sodium chloride 75 mL/hr  at 05/04/19 1000  . methocarbamol (ROBAXIN) IV       LOS: 6 days    Time spent: 25 mins.      Bonnell Public, MD [facility]  If 7PM-7AM, please contact night-coverage [email or web address] Password [facility] 05/06/2019, 9:52 AM

## 2019-05-07 LAB — SARS CORONAVIRUS 2 (TAT 6-24 HRS): SARS Coronavirus 2: NEGATIVE

## 2019-05-07 NOTE — Care Management Important Message (Signed)
Important Message  Patient Details IM Letter given to Evette Cristal SW Case Manager to present to the Patient Name: Jeremy Kelley MRN: [id number] Date of Birth: [birth year]   Medicare Important Message Given:  Yes     Kerin Salen 05/07/2019, 9:51 AM

## 2019-05-07 NOTE — TOC Progression Note (Signed)
Transition of Care [facility]) - Progression Note    Patient Details  Name: Jeremy Kelley MRN: [id number] Date of Birth: May 03, 1926  [facility] CM/SW Contact  Ross Ludwig, [place] Phone Number: 05/07/2019, 12:12 PM  Clinical Narrative:     CSW spoke with Will, per patient's request regarding SNF offers, he chose [facility].  CSW spoke with [facility], they can accept patient tomorrow pending a negative Covid test.  CSW updated physician that another Covid test needs to be completed, physician ordered test.  CSW updated [facility] they will accept him tomorrow.  CSW to continue to follow patient's progress throughout discharge planning.   Expected Discharge Plan: Skilled Nursing Facility Barriers to Discharge: SNF Pending bed offer  Expected Discharge Plan and Services Expected Discharge Plan: [place]   Discharge Planning Services: CM Consult Post Acute Care Choice: [place] Living arrangements for the past 2 months: Apartment                                       Social Determinants of Health (SDOH) Interventions    Readmission Risk Interventions No flowsheet data found.

## 2019-05-07 NOTE — Progress Notes (Signed)
PROGRESS NOTE    Jeremy Kelley  [id number] DOB: [birth year] DOA: 04/30/2019 PCP: [name], NP   Brief Narrative: Patient is a 86 year old Caucasian male with past medical history significant for hypertension, hyperlipidemia, Chronic diastolic CHF, pulmonary hypertension, chronic A. fib on anticoagulation, chronic carotid artery stenosis bilaterally, chronic back pain, urethral carcinoma, prostate cancer, recent closed fracture of left tibia status post hemiarthroplasty on 03/26/2019 who presents after having  fall at home.  X-ray of the right pelvis showed acute displaced humeral neck fracture.  Orthopedics consulted.  Underwent right hemihip arthroplasty on 05/03/2019.  PT/OT evaluation pending.  Most likely he will need skilled nursing facility on discharge.  05/07/2019: Patient seen.  No new changes.  Patient is awaiting discharge.  Assessment & Plan:   Principal Problem:   Displaced fracture of right femoral neck (HCC) Active Problems:   Hyperlipidemia   Essential hypertension   Atrial fibrillation, chronic (HCC)   Pulmonary hypertension (HCC)   CAD (coronary artery disease)   Dementia ([place])   DNR (do not resuscitate)   Fall at home, initial encounter   Pressure injury of skin   Displaced right femoral fracture secondary to mechanical fall: Orthopedics following.  Continue pain management. Underwent right hemihip arthroplasty on 05/03/2019.  PT/OT evaluation pending.  Most likely he will need skilled nursing facility on discharge. 05/07/2019: Pursue disposition.  Chronic A. fib: On Eliquis at home.Resumed.  On metoprolol for rate control.  .  Mild dementia: On Aricept.  Mostly alert and oriented.  Hypertension: Blood pressure currently stable.  Continue current regimen  Pulmonary hypertension with diastolic CHF: Last echocardiogram done in January 2020 showed preserved ejection fraction of 55 to 60%.  Appears euvolemic.  On torsemide 20 mg daily which we will  continue  Hyperlipidemia: Continue statin  History of urethral/prostate cancer: Currently in remission  CODE STATUS: DNR  DVT prophylaxis:Lovenox Code Status: DNR Family Communication:  Disposition Plan: SNF.  Consultants: Orthopedics  Procedures: None  Antimicrobials:  Anti-infectives (From admission, onward)   Start     Dose/Rate Route Frequency Ordered Stop   05/03/19 2000  ceFAZolin (ANCEF) IVPB 2g/100 mL premix     2 g 200 mL/hr over 30 Minutes Intravenous Every 6 hours 05/03/19 1657 05/04/19 1154   05/03/19 1354  vancomycin (VANCOCIN) powder  Status:  Discontinued       As needed 05/03/19 1355 05/03/19 1630   05/03/19 1030  ceFAZolin (ANCEF) IVPB 2g/100 mL premix  Status:  Discontinued     2 g 200 mL/hr over 30 Minutes Intravenous On call to O.R. 05/03/19 1016 05/03/19 1021   05/03/19 0600  ceFAZolin (ANCEF) IVPB 2g/100 mL premix    Note to Pharmacy: Anesthesia to give preop   2 g 200 mL/hr over 30 Minutes Intravenous On call to O.R. 05/02/19 1830 05/03/19 1315      Subjective: No new complaints  Objective: Vitals:   05/06/19 0926 05/06/19 1518 05/06/19 2102 05/07/19 0536  BP: (!) 109/55 (!) 113/52 111/63 118/62  Pulse: 80 68 68 63  Resp: (!) 22 (!) 24 16 18   Temp: 98 F (36.7 C) 98.7 F (37.1 C) 97.7 F (36.5 C) 97.6 F (36.4 C)  TempSrc:   [place]  SpO2: 95% 95% 94% 92%  Weight:      Height:        Intake/Output Summary (Last 24 hours) at 05/07/2019 1253 Last data filed at 05/07/2019 0537 Gross per 24 hour  Intake 360 ml  Output 500 ml  Net -140 ml   Filed Weights   04/30/19 2239 05/03/19 0533  Weight: 57.1 kg 55.2 kg    Examination: General exam: Cachectic  Respiratory system: Adequate air entry Cardiovascular system: S1S2 Gastrointestinal system: Abdomen is nondistended, soft and nontender.  Normal bowel sounds heard. Central nervous system: Awake and alert.  Patient moves all extremites  Extremities: No edema    Data Reviewed: I  have personally reviewed following labs and imaging studies  CBC: Recent Labs  Lab 05/01/19 0527 05/02/19 0509 05/04/19 0553 05/05/19 0635 05/06/19 0542  WBC 9.1 8.8 11.6* 9.2 10.1  NEUTROABS  --  7.0 10.0*  --   --   HGB 12.5* 11.7* 12.0* 10.6* 10.7*  HCT 39.5 37.1* 38.1* 33.4* 33.8*  MCV 100.3* 100.3* 101.1* 100.6* 99.1  PLT 228 222 240 227 [id number]   Basic Metabolic Panel: Recent Labs  Lab 05/01/19 0527 05/04/19 0553 05/05/19 0635  NA 142 142 140  K 4.1 5.1 4.2  CL 106 104 102  CO2 28 28 31   GLUCOSE 100* 145* 101*  BUN 20 26* 27*  CREATININE 1.08 0.93 0.89  CALCIUM 9.6 9.5 8.9   GFR: Estimated Creatinine Clearance: 43.1 mL/min (by C-G formula based on SCr of 0.89 mg/dL). Liver Function Tests: No results for input(s): AST, ALT, ALKPHOS, BILITOT, PROT, ALBUMIN in the last 168 hours. No results for input(s): LIPASE, AMYLASE in the last 168 hours. No results for input(s): AMMONIA in the last 168 hours. Coagulation Profile: No results for input(s): INR, PROTIME in the last 168 hours. Cardiac Enzymes: No results for input(s): CKTOTAL, CKMB, CKMBINDEX, TROPONINI in the last 168 hours. BNP (last 3 results) Recent Labs    08/31/18 1247  PROBNP 459.0*   HbA1C: No results for input(s): HGBA1C in the last 72 hours. CBG: No results for input(s): GLUCAP in the last 168 hours. Lipid Profile: No results for input(s): CHOL, HDL, LDLCALC, TRIG, CHOLHDL, LDLDIRECT in the last 72 hours. Thyroid Function Tests: No results for input(s): TSH, T4TOTAL, FREET4, T3FREE, THYROIDAB in the last 72 hours. Anemia Panel: No results for input(s): VITAMINB12, FOLATE, FERRITIN, TIBC, IRON, RETICCTPCT in the last 72 hours. Sepsis Labs: No results for input(s): PROCALCITON, LATICACIDVEN in the last 168 hours.  Recent Results (from the past 240 hour(s))  Respiratory Panel by RT PCR (Flu A&B, Covid) - Nasopharyngeal Swab     Status: None   Collection Time: 04/30/19 10:13 AM   Specimen:  Nasopharyngeal Swab  Result Value Ref Range Status   SARS Coronavirus 2 by RT PCR NEGATIVE NEGATIVE Final    Comment: (NOTE) SARS-CoV-2 target nucleic acids are NOT DETECTED. The SARS-CoV-2 RNA is generally detectable in upper respiratoy specimens during the acute phase of infection. The lowest concentration of SARS-CoV-2 viral copies this assay can detect is 131 copies/mL. A negative result does not preclude SARS-Cov-2 infection and should not be used as the sole basis for treatment or other patient management decisions. A negative result may occur with  improper specimen collection/handling, submission of specimen other than nasopharyngeal swab, presence of viral mutation(s) within the areas targeted by this assay, and inadequate number of viral copies (<131 copies/mL). A negative result must be combined with clinical observations, patient history, and epidemiological information. The expected result is Negative. Fact Sheet for Patients:  PinkCheek.be Fact Sheet for Healthcare Providers:  GravelBags.it This test is not yet ap proved or cleared by the [place] FDA and  has been authorized for detection and/or diagnosis of SARS-CoV-2 by FDA  under an Emergency Use Authorization (EUA). This EUA will remain  in effect (meaning this test can be used) for the duration of the COVID-19 declaration under Section 564(b)(1) of the Act, 21 U.S.C. section 360bbb-3(b)(1), unless the authorization is terminated or revoked sooner.    Influenza A by PCR NEGATIVE NEGATIVE Final   Influenza B by PCR NEGATIVE NEGATIVE Final    Comment: (NOTE) The Xpert Xpress SARS-CoV-2/FLU/RSV assay is intended as an aid in  the diagnosis of influenza from Nasopharyngeal swab specimens and  should not be used as a sole basis for treatment. Nasal washings and  aspirates are unacceptable for Xpert Xpress SARS-CoV-2/FLU/RSV  testing. Fact Sheet for  Patients: PinkCheek.be Fact Sheet for Healthcare Providers: GravelBags.it This test is not yet approved or cleared by the [place] FDA and  has been authorized for detection and/or diagnosis of SARS-CoV-2 by  FDA under an Emergency Use Authorization (EUA). This EUA will remain  in effect (meaning this test can be used) for the duration of the  Covid-19 declaration under Section 564(b)(1) of the Act, 21  U.S.C. section 360bbb-3(b)(1), unless the authorization is  terminated or revoked. Performed at [facility], [place] [address], [place], [place] [address]   Urine culture     Status: Abnormal   Collection Time: 05/03/19  3:59 PM   Specimen: Urine, Catheterized  Result Value Ref Range Status   Specimen Description   Final    URINE, CATHETERIZED Performed at [place] [address], [place], [place] [address]    Special Requests   Final    NONE Performed at [facility], [place] [address], [place], [place] [address]    Culture MULTIPLE SPECIES PRESENT, SUGGEST RECOLLECTION (A)  Final   Report Status 05/04/2019 FINAL  Final         Radiology Studies: No results found.      Scheduled Meds: . apixaban  2.5 mg Oral BID  . Chlorhexidine Gluconate Cloth  6 each Topical Daily  . docusate sodium  100 mg Oral BID  . donepezil  5 mg Oral QHS  . fluorometholone  1 drop Both Eyes QID  . fluticasone  2 spray Each Nare QHS  . Lifitegrast  1 drop Both Eyes BID  . metoprolol succinate  25 mg Oral QHS  . mirabegron ER  50 mg Oral Daily  . pravastatin  20 mg Oral q1800  . senna-docusate  1 tablet Oral QHS  . torsemide  20 mg Oral Daily   Continuous Infusions: . sodium chloride 75 mL/hr at 05/04/19 1000  . methocarbamol (ROBAXIN) IV       LOS: 7 days    Time spent: 15 mins.      Bonnell Public, MD [facility]  If 7PM-7AM, please contact  night-coverage [email or web address] Password [facility] 05/07/2019, 12:53 PM

## 2019-05-08 DIAGNOSIS — H3530 Unspecified macular degeneration: Secondary | ICD-10-CM | POA: Diagnosis not present

## 2019-05-08 DIAGNOSIS — R457 State of emotional shock and stress, unspecified: Secondary | ICD-10-CM | POA: Diagnosis not present

## 2019-05-08 DIAGNOSIS — S72001S Fracture of unspecified part of neck of right femur, sequela: Secondary | ICD-10-CM | POA: Diagnosis not present

## 2019-05-08 DIAGNOSIS — L89153 Pressure ulcer of sacral region, stage 3: Secondary | ICD-10-CM | POA: Diagnosis not present

## 2019-05-08 DIAGNOSIS — K759 Inflammatory liver disease, unspecified: Secondary | ICD-10-CM | POA: Diagnosis not present

## 2019-05-08 DIAGNOSIS — C662 Malignant neoplasm of left ureter: Secondary | ICD-10-CM | POA: Diagnosis not present

## 2019-05-08 DIAGNOSIS — H353132 Nonexudative age-related macular degeneration, bilateral, intermediate dry stage: Secondary | ICD-10-CM | POA: Diagnosis not present

## 2019-05-08 DIAGNOSIS — M2550 Pain in unspecified joint: Secondary | ICD-10-CM | POA: Diagnosis not present

## 2019-05-08 DIAGNOSIS — Z961 Presence of intraocular lens: Secondary | ICD-10-CM | POA: Diagnosis not present

## 2019-05-08 DIAGNOSIS — S72002D Fracture of unspecified part of neck of left femur, subsequent encounter for closed fracture with routine healing: Secondary | ICD-10-CM | POA: Diagnosis not present

## 2019-05-08 DIAGNOSIS — Z515 Encounter for palliative care: Secondary | ICD-10-CM | POA: Diagnosis not present

## 2019-05-08 DIAGNOSIS — H5010 Unspecified exotropia: Secondary | ICD-10-CM | POA: Diagnosis not present

## 2019-05-08 DIAGNOSIS — R195 Other fecal abnormalities: Secondary | ICD-10-CM | POA: Diagnosis not present

## 2019-05-08 DIAGNOSIS — H43811 Vitreous degeneration, right eye: Secondary | ICD-10-CM | POA: Diagnosis not present

## 2019-05-08 DIAGNOSIS — W19XXXD Unspecified fall, subsequent encounter: Secondary | ICD-10-CM | POA: Diagnosis not present

## 2019-05-08 DIAGNOSIS — M1990 Unspecified osteoarthritis, unspecified site: Secondary | ICD-10-CM | POA: Diagnosis not present

## 2019-05-08 DIAGNOSIS — Z7901 Long term (current) use of anticoagulants: Secondary | ICD-10-CM | POA: Diagnosis not present

## 2019-05-08 DIAGNOSIS — I5032 Chronic diastolic (congestive) heart failure: Secondary | ICD-10-CM | POA: Diagnosis not present

## 2019-05-08 DIAGNOSIS — R600 Localized edema: Secondary | ICD-10-CM | POA: Diagnosis not present

## 2019-05-08 DIAGNOSIS — F0390 Unspecified dementia without behavioral disturbance: Secondary | ICD-10-CM | POA: Diagnosis not present

## 2019-05-08 DIAGNOSIS — E440 Moderate protein-calorie malnutrition: Secondary | ICD-10-CM | POA: Diagnosis not present

## 2019-05-08 DIAGNOSIS — Z029 Encounter for administrative examinations, unspecified: Secondary | ICD-10-CM | POA: Diagnosis not present

## 2019-05-08 DIAGNOSIS — S7291XA Unspecified fracture of right femur, initial encounter for closed fracture: Secondary | ICD-10-CM | POA: Diagnosis not present

## 2019-05-08 DIAGNOSIS — H43393 Other vitreous opacities, bilateral: Secondary | ICD-10-CM | POA: Diagnosis not present

## 2019-05-08 DIAGNOSIS — S72001A Fracture of unspecified part of neck of right femur, initial encounter for closed fracture: Secondary | ICD-10-CM | POA: Diagnosis not present

## 2019-05-08 DIAGNOSIS — S80829A Blister (nonthermal), unspecified lower leg, initial encounter: Secondary | ICD-10-CM | POA: Diagnosis not present

## 2019-05-08 DIAGNOSIS — I6523 Occlusion and stenosis of bilateral carotid arteries: Secondary | ICD-10-CM | POA: Diagnosis not present

## 2019-05-08 DIAGNOSIS — I2729 Other secondary pulmonary hypertension: Secondary | ICD-10-CM | POA: Diagnosis not present

## 2019-05-08 DIAGNOSIS — Z96642 Presence of left artificial hip joint: Secondary | ICD-10-CM | POA: Diagnosis not present

## 2019-05-08 DIAGNOSIS — W19XXXA Unspecified fall, initial encounter: Secondary | ICD-10-CM | POA: Diagnosis not present

## 2019-05-08 DIAGNOSIS — R41841 Cognitive communication deficit: Secondary | ICD-10-CM | POA: Diagnosis not present

## 2019-05-08 DIAGNOSIS — I8393 Asymptomatic varicose veins of bilateral lower extremities: Secondary | ICD-10-CM | POA: Diagnosis not present

## 2019-05-08 DIAGNOSIS — H16143 Punctate keratitis, bilateral: Secondary | ICD-10-CM | POA: Diagnosis not present

## 2019-05-08 DIAGNOSIS — R198 Other specified symptoms and signs involving the digestive system and abdomen: Secondary | ICD-10-CM | POA: Diagnosis not present

## 2019-05-08 DIAGNOSIS — M7989 Other specified soft tissue disorders: Secondary | ICD-10-CM | POA: Diagnosis not present

## 2019-05-08 DIAGNOSIS — Z471 Aftercare following joint replacement surgery: Secondary | ICD-10-CM | POA: Diagnosis not present

## 2019-05-08 DIAGNOSIS — Z8551 Personal history of malignant neoplasm of bladder: Secondary | ICD-10-CM | POA: Diagnosis not present

## 2019-05-08 DIAGNOSIS — Z7401 Bed confinement status: Secondary | ICD-10-CM | POA: Diagnosis not present

## 2019-05-08 DIAGNOSIS — E46 Unspecified protein-calorie malnutrition: Secondary | ICD-10-CM | POA: Diagnosis not present

## 2019-05-08 DIAGNOSIS — I10 Essential (primary) hypertension: Secondary | ICD-10-CM | POA: Diagnosis not present

## 2019-05-08 DIAGNOSIS — Z8546 Personal history of malignant neoplasm of prostate: Secondary | ICD-10-CM | POA: Diagnosis not present

## 2019-05-08 DIAGNOSIS — I872 Venous insufficiency (chronic) (peripheral): Secondary | ICD-10-CM | POA: Diagnosis not present

## 2019-05-08 DIAGNOSIS — Z9181 History of falling: Secondary | ICD-10-CM | POA: Diagnosis not present

## 2019-05-08 DIAGNOSIS — Z23 Encounter for immunization: Secondary | ICD-10-CM | POA: Diagnosis not present

## 2019-05-08 DIAGNOSIS — M6281 Muscle weakness (generalized): Secondary | ICD-10-CM | POA: Diagnosis not present

## 2019-05-08 DIAGNOSIS — H16223 Keratoconjunctivitis sicca, not specified as Sjogren's, bilateral: Secondary | ICD-10-CM | POA: Diagnosis not present

## 2019-05-08 DIAGNOSIS — I272 Pulmonary hypertension, unspecified: Secondary | ICD-10-CM | POA: Diagnosis not present

## 2019-05-08 DIAGNOSIS — G453 Amaurosis fugax: Secondary | ICD-10-CM | POA: Diagnosis not present

## 2019-05-08 DIAGNOSIS — F0280 Dementia in other diseases classified elsewhere without behavioral disturbance: Secondary | ICD-10-CM | POA: Diagnosis not present

## 2019-05-08 DIAGNOSIS — R1319 Other dysphagia: Secondary | ICD-10-CM | POA: Diagnosis not present

## 2019-05-08 DIAGNOSIS — Z96649 Presence of unspecified artificial hip joint: Secondary | ICD-10-CM | POA: Diagnosis not present

## 2019-05-08 DIAGNOSIS — I4891 Unspecified atrial fibrillation: Secondary | ICD-10-CM | POA: Diagnosis not present

## 2019-05-08 DIAGNOSIS — I480 Paroxysmal atrial fibrillation: Secondary | ICD-10-CM | POA: Diagnosis not present

## 2019-05-08 DIAGNOSIS — H5021 Vertical strabismus, right eye: Secondary | ICD-10-CM | POA: Diagnosis not present

## 2019-05-08 DIAGNOSIS — R0902 Hypoxemia: Secondary | ICD-10-CM | POA: Diagnosis not present

## 2019-05-08 DIAGNOSIS — L0390 Cellulitis, unspecified: Secondary | ICD-10-CM | POA: Diagnosis not present

## 2019-05-08 DIAGNOSIS — Z20822 Contact with and (suspected) exposure to covid-19: Secondary | ICD-10-CM | POA: Diagnosis not present

## 2019-05-08 DIAGNOSIS — K5790 Diverticulosis of intestine, part unspecified, without perforation or abscess without bleeding: Secondary | ICD-10-CM | POA: Diagnosis not present

## 2019-05-08 DIAGNOSIS — H532 Diplopia: Secondary | ICD-10-CM | POA: Diagnosis not present

## 2019-05-08 DIAGNOSIS — R2681 Unsteadiness on feet: Secondary | ICD-10-CM | POA: Diagnosis not present

## 2019-05-08 DIAGNOSIS — I2510 Atherosclerotic heart disease of native coronary artery without angina pectoris: Secondary | ICD-10-CM | POA: Diagnosis not present

## 2019-05-08 DIAGNOSIS — R262 Difficulty in walking, not elsewhere classified: Secondary | ICD-10-CM | POA: Diagnosis not present

## 2019-05-08 DIAGNOSIS — Z4789 Encounter for other orthopedic aftercare: Secondary | ICD-10-CM | POA: Diagnosis not present

## 2019-05-08 DIAGNOSIS — H35373 Puckering of macula, bilateral: Secondary | ICD-10-CM | POA: Diagnosis not present

## 2019-05-08 DIAGNOSIS — E785 Hyperlipidemia, unspecified: Secondary | ICD-10-CM | POA: Diagnosis not present

## 2019-05-08 DIAGNOSIS — R1312 Dysphagia, oropharyngeal phase: Secondary | ICD-10-CM | POA: Diagnosis not present

## 2019-05-08 DIAGNOSIS — R2689 Other abnormalities of gait and mobility: Secondary | ICD-10-CM | POA: Diagnosis not present

## 2019-05-08 MED ORDER — FLUOROMETHOLONE 0.1 % OP SUSP: 1.0000 [drp] | Freq: Four times a day (QID) | OPHTHALMIC | 0 refills | Status: DC

## 2019-05-08 MED ORDER — MENTHOL 3 MG MT LOZG: 1.0000 | LOZENGE | OROMUCOSAL | 12 refills | Status: DC | PRN

## 2019-05-08 MED ORDER — MIRABEGRON ER 50 MG PO TB24: 50.0000 mg | ORAL_TABLET | Freq: Every day | ORAL | 0 refills | Status: AC

## 2019-05-08 MED ORDER — K PHOS MONO-SOD PHOS DI & MONO 155-852-130 MG PO TABS: 500.0000 mg | ORAL_TABLET | Freq: Two times a day (BID) | ORAL | 0 refills | Status: DC

## 2019-05-08 MED ORDER — SORBITOL 70 % SOLN: 30.0000 mL | Freq: Every day | 3 refills | Status: DC | PRN

## 2019-05-08 MED ORDER — DOCUSATE SODIUM 100 MG PO CAPS: 100.0000 mg | ORAL_CAPSULE | Freq: Two times a day (BID) | ORAL | 0 refills | Status: DC

## 2019-05-08 MED ORDER — HYDROCODONE-ACETAMINOPHEN 5-325 MG PO TABS: 1.0000 | ORAL_TABLET | Freq: Three times a day (TID) | ORAL | 0 refills | Status: DC | PRN

## 2019-05-08 MED ORDER — SENNOSIDES-DOCUSATE SODIUM 8.6-50 MG PO TABS: 1.0000 | ORAL_TABLET | Freq: Every day | ORAL | 0 refills | Status: DC

## 2019-05-08 MED ORDER — METHOCARBAMOL 500 MG PO TABS: 500.0000 mg | ORAL_TABLET | Freq: Four times a day (QID) | ORAL | 0 refills | Status: DC | PRN

## 2019-05-08 MED ORDER — POLYETHYLENE GLYCOL 3350 17 G PO PACK: 17.0000 g | PACK | Freq: Every day | ORAL | 0 refills | Status: DC | PRN

## 2019-05-08 MED ORDER — APIXABAN 2.5 MG PO TABS: 2.5000 mg | ORAL_TABLET | Freq: Two times a day (BID) | ORAL | 0 refills | Status: AC

## 2019-05-08 MED ORDER — FLUTICASONE PROPIONATE 50 MCG/ACT NA SUSP: 2.0000 | Freq: Every day | NASAL | 0 refills | Status: AC

## 2019-05-08 MED ORDER — XIIDRA 5 % OP SOLN: 1.0000 [drp] | Freq: Two times a day (BID) | OPHTHALMIC | 0 refills | Status: DC

## 2019-05-08 NOTE — Discharge Summary (Signed)
Physician Discharge Summary  Patient ID: Jeremy Kelley MRN: [id number] DOB/AGE: 11/19/1926 86 y.o.  Admit date: 04/30/2019 Discharge date: 05/08/2019  Admission Diagnoses:  Discharge Diagnoses:  Principal Problem:   Displaced fracture of right femoral neck ([place]) Active Problems:   Hyperlipidemia   Essential hypertension   Atrial fibrillation, chronic (HCC)   Pulmonary hypertension (HCC)   CAD (coronary artery disease)   Dementia ([place])   DNR (do not resuscitate)   Fall at home, initial encounter   Pressure injury of skin   Discharged Condition: Displaced fracture of right femoral neck   Hospital Course:  86 year old Caucasian male with past medical history significant for hypertension, hyperlipidemia, Chronic diastolic CHF, pulmonary hypertension, chronic A. fib on anticoagulation, chronic carotid artery stenosis bilaterally, chronic back pain, urethral carcinoma, prostate cancer, recent closed fracture of left tibia status post hemiarthroplasty on 03/26/2019 who presents after having  fall at home.  X-ray of the right pelvis showed acute displaced humeral neck fracture.  Orthopedics consulted.  Underwent right hemihip arthroplasty on 05/03/2019.  PT/OT evaluation pending.  Most likely he will need skilled nursing facility on discharge. Patient is clinically stable for discharge to SNF to continue care.   Consults: Orthopedics  Significant Diagnostic Studies: Displaced right femoral neck fracture  Treatments: IV fluid/pain meds  Discharge Exam: Blood pressure 120/60, pulse 65, temperature 98.4 F (36.9 C), resp. rate 18, height 5\' 9"  (1.753 m), weight 55.2 kg, SpO2 92 %. General appearance: alert, cooperative and no distress Ears: normal TM's and external ear canals both ears Resp: clear to auscultation bilaterally GI: soft, non-tender; bowel sounds normal; no masses,  no organomegaly Extremities: extremities normal, atraumatic, no cyanosis or edema Skin: Skin color,  texture, turgor normal. No rashes or lesions Neurologic: Mental status: Dementia with cognitive impairment  Disposition:   Discharge Instructions    Weight bearing as tolerated   Complete by: As directed      Allergies as of 05/08/2019   No Known Allergies     Medication List    TAKE these medications   acetaminophen 325 MG tablet Commonly known as: TYLENOL Take 1-2 tablets (325-650 mg total) by mouth every 6 (six) hours as needed for mild pain or moderate pain (pain score 1-3 or temp > 100.5). What changed: how much to take   apixaban 2.5 MG Tabs tablet Commonly known as: Eliquis Take 1 tablet (2.5 mg total) by mouth 2 (two) times daily.   B-complex with vitamin C tablet Take 1 tablet by mouth daily. Reported on 07/24/2015   cholecalciferol 1000 units tablet Commonly known as: VITAMIN D Take 1 tablet (1,000 Units total) by mouth daily.   docusate sodium 100 MG capsule Commonly known as: COLACE Take 1 capsule (100 mg total) by mouth 2 (two) times daily.   donepezil 5 MG tablet Commonly known as: ARICEPT One tablet daily What changed:   how much to take  how to take this  when to take this   fluorometholone 0.1 % ophthalmic suspension Commonly known as: FML Place 1 drop into both eyes 4 (four) times daily.   fluticasone 50 MCG/ACT nasal spray Commonly known as: FLONASE Place 2 sprays into both nostrils at bedtime.   HYDROcodone-acetaminophen 5-325 MG tablet Commonly known as: Norco Take 1-2 tablets by mouth 3 (three) times daily as needed.   ipratropium 0.06 % nasal spray Commonly known as: ATROVENT Place 2 sprays into the nose 2 (two) times daily as needed. Nasal drainage What changed: reasons to take this  menthol-cetylpyridinium 3 MG lozenge Commonly known as: CEPACOL Take 1 lozenge (3 mg total) by mouth as needed for sore throat (sore throat).   methocarbamol 500 MG tablet Commonly known as: ROBAXIN Take 1 tablet (500 mg total) by mouth every 6  (six) hours as needed for muscle spasms.   metoprolol succinate 25 MG 24 hr tablet Commonly known as: TOPROL-XL TAKE 1 TABLET BY MOUTH EVERYDAY AT BEDTIME What changed: See the new instructions.   mirabegron ER 50 MG Tb24 tablet Commonly known as: Myrbetriq Take 1 tablet (50 mg total) by mouth daily.   Nutritional Supplement Liqd Take 120 mLs by mouth daily. MedPass   phosphorus 155-852-130 MG tablet Commonly known as: K PHOS NEUTRAL Take 2 tablets (500 mg total) by mouth 2 (two) times daily.   polyethylene glycol 17 g packet Commonly known as: MIRALAX / GLYCOLAX Take 17 g by mouth daily as needed for mild constipation.   potassium chloride 10 MEQ CR capsule Commonly known as: MICRO-K Take 1 capsule (10 mEq total) by mouth daily.   pravastatin 20 MG tablet Commonly known as: PRAVACHOL TAKE 1 TABLET BY MOUTH EVERYDAY AT BEDTIME   senna-docusate 8.6-50 MG tablet Commonly known as: Senokot-S Take 1 tablet by mouth at bedtime.   sorbitol 70 % Soln Take 30 mLs by mouth daily as needed for moderate constipation.   torsemide 20 MG tablet Commonly known as: DEMADEX Take 1 tablet (20 mg total) by mouth daily.   Xi[place]ra 5 % Soln Generic drug: Lifitegrast Place 1 drop into both eyes 2 (two) times daily.            Discharge Care Instructions  (From admission, onward)         Start     Ordered   05/03/19 0000  Weight bearing as tolerated     05/03/19 1419          Contact information for follow-up providers    Leandrew Koyanagi, MD In 2 weeks.   Specialty: Orthopedic Surgery Why: For suture removal, For wound re-check Contact information: [place] [id number] [phone]            Contact information for after-discharge care    Destination    [facility] Preferred SNF .   Service: Skilled Nursing Contact information: [place] [phone]                  Total time spent  for this discharge encounter is 40 minutes  Signed: Elie Confer 05/08/2019, 12:25 PM

## 2019-05-08 NOTE — TOC Transition Note (Signed)
Transition of Care [facility]) - CM/SW Discharge Note   Patient Details  Name: OSBURN BABIAK MRN: [id number] Date of Birth: September 29, 1926  Transition of Care [name]) CM/SW Contact:  Trish Mage, LCSW Phone Number: 05/08/2019, 11:20 AM   Clinical Narrative:   Patient to d/c today.  PTAR arranged.  Nursing, please call report to [phone], room [facility].  TOC sign off.    Final next level of care: Skilled Nursing Facility Barriers to Discharge: Barriers Resolved   Patient Goals and CMS Choice Patient states their goals for this hospitalization and ongoing recovery are:: "I have no choice but to go back to rehab." CMS Medicare.gov Compare Post Acute Care list provided to:: Patient Choice offered to / list presented to : Patient  Discharge Placement                       Discharge Plan and Services   Discharge Planning Services: CM Consult Post Acute Care Choice: [place]                               Social Determinants of Health (SDOH) Interventions     Readmission Risk Interventions No flowsheet data found.

## 2019-05-08 NOTE — Progress Notes (Signed)
Report called to facility by Verdis Frederickson, RN.  Patient transported to [facility] by [employer].  Son came and transported all of patients luggage.  Virginia Rochester, RN

## 2019-05-10 ENCOUNTER — Other Ambulatory Visit: Payer: Self-pay

## 2019-05-10 ENCOUNTER — Other Ambulatory Visit: Payer: Self-pay | Admitting: *Deleted

## 2019-05-10 DIAGNOSIS — I272 Pulmonary hypertension, unspecified: Secondary | ICD-10-CM | POA: Diagnosis not present

## 2019-05-10 DIAGNOSIS — G453 Amaurosis fugax: Secondary | ICD-10-CM | POA: Diagnosis not present

## 2019-05-10 DIAGNOSIS — S72001A Fracture of unspecified part of neck of right femur, initial encounter for closed fracture: Secondary | ICD-10-CM | POA: Diagnosis not present

## 2019-05-10 DIAGNOSIS — R262 Difficulty in walking, not elsewhere classified: Secondary | ICD-10-CM | POA: Diagnosis not present

## 2019-05-10 NOTE — Patient Outreach (Signed)
[facility]) Care Management  05/10/2019  Jeremy Kelley Oct 02, 1926 [id number]   Closure post SNF placement   Pt discharged to a SNF for rehab and will be followed by the post acute liaison [name]). Case will be closed via disease management follow ups.  Raina Mina, RN Care Management Coordinator [place] Office [phone]

## 2019-05-10 NOTE — Patient Outreach (Signed)
Screened for potential [facility] Care Management needs as a benefit of  [employer] Medicare.  Jeremy Kelley is currently receiving skilled therapy at [facility].   Writer attended telephonic interdisciplinary team meeting earlier today to assess for disposition needs and transition plan for resident.   Jeremy Kelley was active with [facility] prior to admission. He could benefit from ALF placement post SNF.   Will follow for transition plans and disposition needs while Jeremy Kelley resides in SNF.    Marthenia Rolling, MSN-Ed, RN,BSN [facility] Acute Care Coordinator [phone] [facility]) [phone]  (Toll free office)

## 2019-05-10 NOTE — Patient Outreach (Signed)
[facility]) Care Management  05/10/2019  Jeremy Kelley [birth year] [id number]   Pt discharged to a SNF for rehab and will be followed by the post acute liaison [name]). .  Case closure   [name], [place] Worker [phone]

## 2019-05-11 ENCOUNTER — Telehealth: Payer: Self-pay | Admitting: Orthopaedic Surgery

## 2019-05-11 DIAGNOSIS — I10 Essential (primary) hypertension: Secondary | ICD-10-CM | POA: Diagnosis not present

## 2019-05-11 DIAGNOSIS — R1319 Other dysphagia: Secondary | ICD-10-CM | POA: Diagnosis not present

## 2019-05-11 DIAGNOSIS — G453 Amaurosis fugax: Secondary | ICD-10-CM | POA: Diagnosis not present

## 2019-05-11 NOTE — Telephone Encounter (Signed)
Oh that's right.  I forgot about that.  Thanks.

## 2019-05-11 NOTE — Telephone Encounter (Signed)
Called Jessica back no answer LMOM to return call to advise on message below.

## 2019-05-11 NOTE — Telephone Encounter (Signed)
Janett Billow with [facility] health and rehab called in said they have on the pt's discharge notes from his right hip surgery on 05-03-19 that he needs a post-op appt for suture removal and wound check but the rehab clinic is wondering if they can do that for the pt or does he need to come in and specifically be seen by dr.xu?    [phone]

## 2019-05-11 NOTE — Telephone Encounter (Signed)
Fine to take out sutures 2 weeks from 05/03/19.  I noticed that he's scheduled with yates on 3/2.  Should go ahead and cancel that appt.  Thanks.

## 2019-05-11 NOTE — Telephone Encounter (Signed)
03/26/2019 had SU by Dr Lorin Mercy L hip. So that is why he will come in  for appt on 06/12/2019 to f/u.

## 2019-05-14 ENCOUNTER — Other Ambulatory Visit: Payer: Self-pay | Admitting: *Deleted

## 2019-05-14 DIAGNOSIS — H532 Diplopia: Secondary | ICD-10-CM | POA: Diagnosis not present

## 2019-05-14 DIAGNOSIS — H5010 Unspecified exotropia: Secondary | ICD-10-CM | POA: Diagnosis not present

## 2019-05-14 DIAGNOSIS — H353132 Nonexudative age-related macular degeneration, bilateral, intermediate dry stage: Secondary | ICD-10-CM | POA: Diagnosis not present

## 2019-05-14 DIAGNOSIS — Z961 Presence of intraocular lens: Secondary | ICD-10-CM | POA: Diagnosis not present

## 2019-05-14 DIAGNOSIS — H16143 Punctate keratitis, bilateral: Secondary | ICD-10-CM | POA: Diagnosis not present

## 2019-05-14 DIAGNOSIS — H43811 Vitreous degeneration, right eye: Secondary | ICD-10-CM | POA: Diagnosis not present

## 2019-05-14 DIAGNOSIS — H35373 Puckering of macula, bilateral: Secondary | ICD-10-CM | POA: Diagnosis not present

## 2019-05-14 DIAGNOSIS — H43393 Other vitreous opacities, bilateral: Secondary | ICD-10-CM | POA: Diagnosis not present

## 2019-05-14 DIAGNOSIS — H5021 Vertical strabismus, right eye: Secondary | ICD-10-CM | POA: Diagnosis not present

## 2019-05-14 DIAGNOSIS — H16223 Keratoconjunctivitis sicca, not specified as Sjogren's, bilateral: Secondary | ICD-10-CM | POA: Diagnosis not present

## 2019-05-14 NOTE — Telephone Encounter (Signed)
Patient will come in on Friday PM

## 2019-05-14 NOTE — Patient Outreach (Addendum)
Member screened for potential [facility] Care Management needs as a benefit of [place] Medicare.  [name] is receiving skilled therapy at [facility].   Mr.  Jeremy Kelley was active with [facility] prior to admission.  Telephone call made to Jeremy Kelley at [phone] co-HCPOA to discuss their transition plans after SNF. Jeremy Kelley reports she and her husband Jeremy Kelley) have been looking into several different ALFs in the area. However, they are concerned, per [name], he has only worked with therapy once for a PT eval. Reportedly, he has not been out of bed since. They are concerned he will be too deconditioned to go to ALF post SNF.   Jeremy Kelley expresses another concern is that [name] has had weight loss and has become more weak and frail. [name] want to know if member is hospice appropriate and are requesting a palliative care consult while in SNF. Jeremy Kelley states if member is hospice appropriate, they are willing to take Jeremy Kelley home with 24/7. If not hospice appropriate, would prefer ALF placement post SNF. Nonetheless, [name] are wanting to speak with facility about their concerns.   Jeremy Kelley reports she has left a message for the Administrator. Writer sent communication to facility [employer] detailing [name] concerns. Writer called facility in attempt to reach nursing supervisor. Writer's call routed to leave message for Administrator. Attempted to call back to leave message for Director of Nursing. Phone just rang and rang.   Will continue to follow up with facility and family.   Jeremy Rolling, MSN-Ed, RN,BSN [place] Acute Care Coordinator [phone] [facility]) [phone]  (Toll free office)

## 2019-05-15 ENCOUNTER — Non-Acute Institutional Stay: Payer: Medicare Other | Admitting: Internal Medicine

## 2019-05-15 ENCOUNTER — Other Ambulatory Visit: Payer: Self-pay | Admitting: *Deleted

## 2019-05-15 DIAGNOSIS — R198 Other specified symptoms and signs involving the digestive system and abdomen: Secondary | ICD-10-CM | POA: Diagnosis not present

## 2019-05-15 DIAGNOSIS — E46 Unspecified protein-calorie malnutrition: Secondary | ICD-10-CM

## 2019-05-15 DIAGNOSIS — I10 Essential (primary) hypertension: Secondary | ICD-10-CM | POA: Diagnosis not present

## 2019-05-15 DIAGNOSIS — R413 Other amnesia: Secondary | ICD-10-CM

## 2019-05-15 DIAGNOSIS — Z515 Encounter for palliative care: Secondary | ICD-10-CM

## 2019-05-15 DIAGNOSIS — I5032 Chronic diastolic (congestive) heart failure: Secondary | ICD-10-CM | POA: Diagnosis not present

## 2019-05-15 DIAGNOSIS — R262 Difficulty in walking, not elsewhere classified: Secondary | ICD-10-CM | POA: Diagnosis not present

## 2019-05-15 DIAGNOSIS — F0280 Dementia in other diseases classified elsewhere without behavioral disturbance: Secondary | ICD-10-CM | POA: Diagnosis not present

## 2019-05-15 DIAGNOSIS — S72001A Fracture of unspecified part of neck of right femur, initial encounter for closed fracture: Secondary | ICD-10-CM | POA: Diagnosis not present

## 2019-05-15 NOTE — Patient Outreach (Signed)
[facility] Post Acute Care Coordinator follow up.  Jeremy Kelley remains at [facility].   Spoke with [name] co-HCPOA via phone [phone]. [name] reports that she has spoken with English as a second language teacher. [name] states that she and her husband's ([name])  concerns have been addressed. [name] has been working with therapy Monday thru Friday. It is thought that he may have some confusion. Palliative Care referral was requested and has been made to [facility].  Will continue to follow for transition plans and collaborate as needed.   Jeremy Rolling, MSN-Ed, RN,BSN [place] Acute Care Coordinator [phone] [facility]) [phone]  (Toll free office)

## 2019-05-16 ENCOUNTER — Other Ambulatory Visit: Payer: Self-pay

## 2019-05-16 DIAGNOSIS — R262 Difficulty in walking, not elsewhere classified: Secondary | ICD-10-CM | POA: Diagnosis not present

## 2019-05-16 DIAGNOSIS — R195 Other fecal abnormalities: Secondary | ICD-10-CM | POA: Diagnosis not present

## 2019-05-16 DIAGNOSIS — E440 Moderate protein-calorie malnutrition: Secondary | ICD-10-CM | POA: Diagnosis not present

## 2019-05-16 DIAGNOSIS — S72001A Fracture of unspecified part of neck of right femur, initial encounter for closed fracture: Secondary | ICD-10-CM | POA: Diagnosis not present

## 2019-05-16 NOTE — Progress Notes (Signed)
[place] Consult Note Telephone: [phone]  Fax: [phone]  PATIENT NAME: Jeremy Kelley DOB: [birth year] MRN: [id number]  PRIMARY CARE PROVIDER:   [name], NP  REFERRING PROVIDER:  Dr. Virl Kelley  RESPONSIBLE PARTY:   Self and Jeremy Kelley [facility] Kelley,Jeremy Other   [phone]   Jeremy Kelley (will) Other   [phone] POA--    RECOMMENDATIONS and PLAN:  Palliative care encounter  Z51.5  1. Advance care planning:  Immediate goals reviewed which are to improve strength, mobility and transition to residence at [facility].  He expresses that he is unable to live alone now.  Advanced directives in place with MOST selections of DNAR/DNI, limited interventions, antibiotics and IV fluids if indicated and tube feedings temporarily.  Document is on chart. Additional discussion occured with HCPOAs who agree that pt's goals are what they are working on as well.  Palliative care will continue to follow patient.  2.  Diarrhea:  Evaluate for possible C. Diff.  Low residue diet  3.  Dysphagia:  Current pureed diet not appealing to patient and not totally accepted.  Speech therapy is to evaluate pt for potential diet advancement which is necessary to be admitted to AL.  4.  Protein calorie malnutrition/Weight loss:  As related to above.  #10 loss over this past month.  Poor outcome if current trajectory continues. House nutritional supplement between meals TID.  Consider appetite stimulant(Remeron) nightly.  Weekly weights.  Continue to monitor for need to transition to comfort care if patient continues to decline.  5. Cognitive deficit:  Related to dementia.  FAST stage 7b.  Continue Aricept and assistance with all ADLs.   I spent 60 minutes providing this consultation,  from 1500 to 1600. More than 50% of the time in this consultation was spent with review of medical records, coordinating communication  with patient, POA and Jeremy Phoenix, NP.   HISTORY OF PRESENT ILLNESS:  Jeremy Kelley is a 86 y.o. year old male with multiple medical problems including afib, dementia, carotid artery stenosis and unsteadiness of gait with fall,dysphagia,  weight loss.  He was hospitalized from 11/11-11/14/20 due to a small bowel obstruction,  from 12/13-12/18/20 due to a fall with resulting L hip fracture and repair.  He was discharged to a SNF, returned home and was again hospitalized from 1/18-126/21 after a fall and R femoral neck fracture and repair. Staff reports that he does not eat pureed diet well and spends numerous hours in the bed.  His hospital discharge weight was 121# and most recent facility weight was 111#.  Pt states that his normal weight is 139-140# several months ago.  He has a reported stage 1 decubitus of his sacrum.    Palliative Care was asked to help address goals of care.   CODE STATUS: DNAR/DNI  PPS: 30% HOSPICE ELIGIBILITY/DIAGNOSIS: TBD /Currently receiving physical therapy  PAST MEDICAL HISTORY:  Past Medical History:  Diagnosis Date  . Arthritis   . Carotid artery stenosis 11/2008   bilateral 40-59% stenosis  . Colon polyps   . Diverticulosis   . Hepatitis    doesn't know which type 1969  . Hyperlipidemia   . Hypertension   . Numbness    fingertips  . Phimosis   . Prostate cancer ([place]) 2001  . 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 acetaminophen (TYLENOL) 325 MG tablet Take 1-2 tablets (325-650 mg total) by mouth every 6 (six) hours as needed for mild pain or moderate pain (pain score 1-3 or temp > 100.5). (Patient taking differently: Take 84 650 mg by mouth every 6 (six) hours as needed for mild pain or moderate pain (pain score 1-3 or temp > 100.5). )  . apixaban (ELIQUIS) 2.5 MG TABS tablet  Take 1 tablet (2.5 mg total) by mouth 2 (two) times daily.  . B Complex-C (B-COMPLEX WITH VITAMIN C) tablet Take 1 tablet by mouth daily. Reported on 07/24/2015  . cholecalciferol (VITAMIN D) 1000 units tablet Take 1 tablet (1,000 Units total) by mouth daily. (Patient not taking: Reported on 04/30/2019)  . docusate sodium (COLACE) 100 MG capsule Take 1 capsule (100 mg total) by mouth 2 (two) times daily.  [name] donepezil (ARICEPT) 5 MG tablet One tablet daily (Patient taking differently: Take 84 5 mg by mouth at bedtime. One tablet daily)  . fluorometholone (FML) 0.1 % ophthalmic suspension Place 1 drop into both eyes 4 (four) times daily.  . fluticasone (FLONASE) 50 MCG/ACT nasal spray Place 2 sprays into both nostrils at bedtime.  [name] HYDROcodone-acetaminophen (NORCO) 5-325 MG tablet Take 1-2 tablets by mouth 3 (three) times daily as needed.  [name] ipratropium (ATROVENT) 0.06 % nasal spray Place 2 sprays into the nose 2 (two) times daily as needed. Nasal drainage (Patient taking differently: Place 2 sprays into the nose 2 (two) times daily as needed (allergies). Nasal drainage)  . menthol-cetylpyridinium (CEPACOL) 3 MG lozenge Take 1 lozenge (3 mg total) by mouth as needed for sore throat (sore throat).  . methocarbamol (ROBAXIN) 500 MG tablet Take 1 tablet (500 mg total) by mouth every 6 (six) hours as needed for muscle spasms.  . metoprolol succinate (TOPROL-XL) 25 MG 24 hr tablet TAKE 1 TABLET BY MOUTH EVERYDAY AT BEDTIME (Patient taking differently: Take 84 25 mg by mouth at bedtime. TAKE 1 TABLET BY MOUTH EVERYDAY AT BEDTIME)  . mirabegron ER (MYRBETRIQ) 50 MG TB24 tablet Take 1 tablet (50 mg total) by mouth daily.  . Nutritional Supplement LIQD Take 120 mLs by mouth daily. MedPass  . phosphorus (K PHOS NEUTRAL) 155-852-130 MG tablet Take 2 tablets (500 mg total) by mouth 2 (two) times daily.  . polyethylene glycol (MIRALAX / GLYCOLAX) 17 g packet Take 17 g by mouth daily as needed for mild constipation.  .  potassium chloride (MICRO-K) 10 MEQ CR capsule Take 1 capsule (10 mEq total) by mouth daily.  . pravastatin (PRAVACHOL) 20 MG tablet TAKE 1 TABLET BY MOUTH EVERYDAY AT BEDTIME  . senna-docusate (SENOKOT-S) 8.6-50 MG tablet Take 1 tablet by mouth at bedtime.  . sorbitol 70 % SOLN Take 30 mLs by mouth daily as needed for moderate constipation.  . torsemide (DEMADEX) 20 MG tablet Take 1 tablet (20 mg total) by mouth daily.  [name] XIIDRA 5 % SOLN Place 1 drop into both eyes 2 (two) times daily.   No facility-administered encounter medications on file as of 05/15/2019.    PHYSICAL EXAM:   General: NAD, frail appearing, cachectic Cardiovascular: regular rate and rhythm Pulmonary: clear ant fields Abdomen: soft, nontender, + bowel sounds GU: no suprapubic tenderness Extremities: no edema, no joint deformities Skin: Venous stasis discoloration BLE.  Surgical incision dressings intact on R femur region.  Reports of sacral decub but not seen. Neurological: Alert and oriented x2. Conversive and able to  follow discussion today. Generalized weakness  Psych:  Cooperative  Gonzella Lex, NP-C

## 2019-05-17 ENCOUNTER — Other Ambulatory Visit: Payer: Self-pay | Admitting: *Deleted

## 2019-05-17 NOTE — Patient Outreach (Signed)
Screened for potential [facility] Care Management needs as a benefit of  [employer].  Mr. Klay is currently receiving skilled therapy at [facility].   Writer attended telephonic interdisciplinary team meeting to assess for disposition needs and transition plan for resident.   Facility reports Mr. Racz is progressing well with therapy. He is also being seen by [facility]. Likely transition plan is ALF.   Will continue to follow for transition plans while member resides in SNF.    Marthenia Rolling, MSN-Ed, RN,BSN [place] Acute Care Coordinator [phone] [facility]) [phone]  (Toll free office)

## 2019-05-18 ENCOUNTER — Other Ambulatory Visit: Payer: Self-pay

## 2019-05-18 ENCOUNTER — Ambulatory Visit (INDEPENDENT_AMBULATORY_CARE_PROVIDER_SITE_OTHER): Payer: Medicare Other

## 2019-05-18 ENCOUNTER — Encounter: Payer: Self-pay | Admitting: Physician Assistant

## 2019-05-18 ENCOUNTER — Ambulatory Visit (INDEPENDENT_AMBULATORY_CARE_PROVIDER_SITE_OTHER): Payer: Medicare Other | Admitting: Orthopaedic Surgery

## 2019-05-18 DIAGNOSIS — Z96649 Presence of unspecified artificial hip joint: Secondary | ICD-10-CM | POA: Diagnosis not present

## 2019-05-18 NOTE — Progress Notes (Signed)
Post-Op Visit Note   Patient: Jeremy Kelley           Date of Birth: 1926-05-30           MRN: [id number] Visit Date: 05/18/2019 PCP: [name], NP   Assessment & Plan:  Chief Complaint:  Chief Complaint  Patient presents with  . Right Hip - Routine Post Op   Visit Diagnoses:  1. S/P hip hemiarthroplasty     Plan: Patient is a pleasant 86 year old gentleman who comes in today 2 weeks out right hip in the arthroplasty from a femoral neck fracture, date of surgery 05/03/2019.  He has had some pain to the right thigh, but nothing more.  He is ambulating with physical therapy.  Examination of his right hip reveals a well-healed surgical incision without complication.  Calf soft nontender.  Today, sutures were removed and Steri-Strips applied.  He will continue with physical therapy weightbearing as tolerated.  Follow-up with [place] in 4 weeks time for repeat evaluation and AP pelvis lateral right hip x-rays.  Call with concerns or questions in the meantime.  Follow-Up Instructions: Return in about 4 weeks (around 06/15/2019).   Orders:  Orders Placed This Encounter  Procedures  . XR HIP UNILAT W OR W/O PELVIS 1V RIGHT   No orders of the defined types were placed in this encounter.   Imaging: XR HIP UNILAT W OR W/O PELVIS 1V RIGHT  Result Date: 05/18/2019 X-rays reveal a well-seated prosthesis without complication   PMFS History: Patient Active Problem List   Diagnosis Date Noted  . Pressure injury of skin 05/02/2019  . Displaced fracture of right femoral neck ([place]) 04/30/2019  . DNR (do not resuscitate) 04/30/2019  . Fall at home, initial encounter 04/30/2019  . History of hemiarthroplasty of left hip 04/25/2019  . Hip fracture ([place]) 04/21/2019  . Dementia ([place]) 04/08/2019  . S/P hip hemiarthroplasty   . Small bowel obstruction ([place]) 02/21/2019  . Closed pilon fracture, left, sequela 10/11/2016  . Pain in left ankle and joints of left foot 10/04/2016  . Idiopathic  chronic venous hypertension of left lower extremity with ulcer and inflammation ([place]) 10/04/2016  . Fatigue 11/04/2015  . CAD (coronary artery disease) 08/01/2015  . Pulmonary hypertension ([place]) 07/24/2015  . Atrial fibrillation, chronic ([place]) 07/16/2015  . Aortic insufficiency 01/04/2014  . Squamous cell carcinoma in situ of skin of right lower leg 11/13/2013  . Lentigo maligna of cheek ([place]) 09/19/2013  . Actinic keratosis 11/06/2012  . Macular degeneration 08/10/2012  . Allergic rhinitis 08/10/2012  . Bladder cancer ([place]) 10/19/2011  . Eczema 02/23/2011  . Hyperglycemia 02/11/2011  . Heart murmur 09/10/2010  . ABNORMAL THYROID FUNCTION TESTS 08/05/2009  . ABNORMAL HEART RHYTHMS 07/11/2009  . Hyperlipidemia 11/28/2008  . CAROTID BRUIT 11/19/2008  . UNSPECIFIED CATARACT 10/17/2008  . PARESTHESIA 12/04/2007  . Varicose veins of bilateral lower extremities with other complications [id number]  . COLONIC POLYPS, HX OF 08/28/2007  . Essential hypertension 02/08/2007  . PROSTATE CANCER, HX OF 11/04/2006  . INGUINAL HERNIA, HX OF 11/04/2006   Past Medical History:  Diagnosis Date  . Arthritis   . Carotid artery stenosis 11/2008   bilateral 40-59% stenosis  . Colon polyps   . Diverticulosis   . Hepatitis    doesn't know which type 1969  . Hyperlipidemia   . Hypertension   . Numbness    fingertips  . Phimosis   . Prostate cancer ([place]) 2001  . Urothelial carcinoma of left distal  ureter ([place])     Family History  Problem Relation Age of Onset  . Colon cancer Neg Hx   . Colon polyps Neg Hx     Past Surgical History:  Procedure Laterality Date  . BIOPSY  10/01/2011   Procedure: BIOPSY;  Surgeon: [name], MD;  Location: [facility];  Service: Urology;;  biopsy of bladder tumor  . BLADDER SUSPENSION  08/18/10   [name]  . CARDIAC CATHETERIZATION N/A 07/29/2015   Procedure: Right/Left Heart Cath and Coronary Angiography;  Surgeon: Peter M Martinique, MD;  Location: [facility];  Service: Cardiovascular;  Laterality: N/A;  . CATARACT EXTRACTION W/ INTRAOCULAR LENS  IMPLANT, BILATERAL Bilateral   . CYSTOSCOPY W/ RETROGRADES  01/27/2012   Procedure: CYSTOSCOPY WITH RETROGRADE PYELOGRAM;  Surgeon: [name], MD;  Location: [facility];  Service: Urology;  Laterality: Left;  . CYSTOSCOPY WITH RETROGRADE PYELOGRAM, URETEROSCOPY AND STENT PLACEMENT Left 11/24/2012   Procedure: CYSTOSCOPY WITH left RETROGRADE PYELOGRAM, bladder washings, ureteroscopy;  Surgeon: [name], MD;  Location: [facility];  Service: Urology;  Laterality: Left;  . CYSTOSCOPY WITH RETROGRADE PYELOGRAM, URETEROSCOPY AND STENT PLACEMENT Bilateral 11/19/2013   Procedure: CYSTOSCOPY WITH BILATERAL RETROGRADE PYELOGRAM,;  Surgeon: [name], MD;  Location: [facility];  Service: Urology;  Laterality: Bilateral;  . EYE SURGERY  09/24/10   left eye. "Retina surgery"  . HIP ARTHROPLASTY Left 03/26/2019   Procedure: ARTHROPLASTY BIPOLAR HIP (HEMIARTHROPLASTY);  Surgeon: Marybelle Killings, MD;  Location: [facility];  Service: Orthopedics;  Laterality: Left;  Depuy press fit, lateral position  . INGUINAL HERNIA REPAIR  1960  . PROSTATE SURGERY     s/p laparoscopic prostate surgery  . SKIN BIOPSY  05/20/2017   Superficial and nodular basal cell carcinoma (left temple)    basal cell carcinoma with sclerosis (right temple)  . 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 SKIN CANCER EXCISION     a/p skin cancer right foot-non melanoma  . skin laceration    . TEE WITHOUT CARDIOVERSION N/A 07/29/2015   Procedure: TRANSESOPHAGEAL ECHOCARDIOGRAM (TEE);  Surgeon: Pixie Casino, MD;  Location[facility] END[facility]COPY;  Service: Cardiovascular;  Laterality: N/A;  cath to follow  . TOTAL HIP ARTHROPLASTY Right 05/03/2019   Procedure: RIGHT HEMI HIP ARTHROPLASTY ANTERIOR APPROACH;  Surgeon: Leandrew Koyanagi, MD;  Location: [facility];  Service: Orthopedics;  Laterality: Right;  WIll Need Retractor Holder   . TRANSURETHRAL RESECTION OF BLADDER TUMOR  10/01/2011   Procedure: TRANSURETHRAL RESECTION OF BLADDER TUMOR (TURBT);  Surgeon: [name], MD;  [facility];  Service: Urology;  Laterality: N/A;  . TRANSURETHRAL RESECTION OF BLADDER TUMOR N/A 11/19/2013   Procedure: TRANSURETHRAL RESECTION OF BLADDER TUMOR (TURBT);  Surgeon: [name], MD;  [facility];  Service: Urology;  Laterality: N/A;  . URETEROSCOPY  01/27/2012   Procedure: URETEROSCOPY;  Surgeon: [name], MD;  [facility];  Service: Urology;  Laterality: Left;  CYSTO, Left RETROGRADE PYELOGRPHY, LEFT URETEROSCOPY   . URINARY SPHINCTER IMPLANT    . VARICOSE VEIN SURGERY     Social History   Occupational History  . Occupation: Retired  Tobacco Use  . Smoking status: Never Smoker  . Smokeless tobacco: Never Used  . Tobacco comment: 07/29/2015 "might smoke a cigar once/year"  Substance and Sexual Activity  . Alcohol use: Yes    Alcohol/week: 14.0 standard drinks    Types: 14 Glasses of wine per week  Comment: 07/29/2015 "big glass of wine q night"  . Drug use: No  . Sexual activity: Not on file

## 2019-05-20 ENCOUNTER — Other Ambulatory Visit: Payer: Self-pay | Admitting: Internal Medicine

## 2019-05-21 ENCOUNTER — Other Ambulatory Visit: Payer: Self-pay

## 2019-05-21 ENCOUNTER — Non-Acute Institutional Stay: Payer: Medicare Other | Admitting: Internal Medicine

## 2019-05-21 DIAGNOSIS — E46 Unspecified protein-calorie malnutrition: Secondary | ICD-10-CM | POA: Diagnosis not present

## 2019-05-21 DIAGNOSIS — Z515 Encounter for palliative care: Secondary | ICD-10-CM | POA: Diagnosis not present

## 2019-05-21 NOTE — Progress Notes (Signed)
Jeremy Kelley Note Telephone: [phone]  Fax: [phone]  PATIENT NAME: Jeremy Kelley DOB: 26-Jan-1927 MRN: [id number]  PRIMARY CARE PROVIDER:   [name], NP  REFERRING PROVIDER:  [name], NP [address],  [place] [address]  RESPONSIBLE PARTY:   Jeremy Kelley  RECOMMENDATIONS and PLAN:    Palliative care encounter  Z51.5  1.  Advance care planning:  DNAR and MOST forms previously completed.   Goal is to continue to improve and move to [place] when wound heals.  Palliative care will continue to follow.  2.   Protein calorie malnutrition/Weight loss:  As related to above.  #10 loss over this past month.  Poor outcome if current trajectory continues. House nutritional supplement between meals TID.  Consider appetite stimulant(Remeron) nightly.  Weekly weights.  Continue to monitor for need to transition to comfort care if patient continues to decline.  3. Dysphagia:  Tolerating soft diet well. Continue aspiration precautions.  Monitor  4.  Sacral wound: Advanced to Stage 2. Complicated by poor nutrition and debilitation. Continue protein and nutritional supplements.  Dressings per wound care nurse. Monitor wound status.  5.  Vascular dementia.  Stable at 7.  Continue Donepezil.  He will need assistance with medication administration in the future.   I spent 45 minutes providing this consultation,  from 1500 to 1545. More than 50% of the time in this consultation was spent coordinating communication with patient, clinical staff and POA.   HISTORY OF PRESENT ILLNESS: Follow-up with Jeremy Kelley. Review of medical records note continued weight loss. Most recent weight is 109# on 2/4 with baseline wt of 134 on 02/22/19. Diet has been upgraded and appears to be tolerating this well.  He has been more mobile with walker and wheelchair. Diarrhea has lessened to soft stools per patient.  Palliative Care was asked to help address goals of care.   CODE STATUS: DNAR  PPS: 40% HOSPICE ELIGIBILITY/DIAGNOSIS: TBD  PAST MEDICAL HISTORY:  Past Medical History:  Diagnosis Date  . Arthritis   . Carotid artery stenosis 11/2008   bilateral 40-59% stenosis  . Colon polyps   . Diverticulosis   . Hepatitis    doesn't know which type 1969  . Hyperlipidemia   . Hypertension   . Numbness    fingertips  . Phimosis   . Prostate cancer ([place]) 2001  . 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 Smoking status: Never Smoker  . Smokeless tobacco: Never Used  . Tobacco comment: 07/29/2015 "might smoke a cigar once/year"  Substance Use Topics  . Alcohol use: Yes    Alcohol/week: 14.0 standard drinks    Types: 14 Glasses of wine per week    Comment: 07/29/2015 "big glass of wine q night"    ALLERGIES: No Known Allergies   PERTINENT MEDICATIONS:  Outpatient Encounter Medications as of 05/21/2019  Medication Sig  . acetaminophen (TYLENOL) 325 MG tablet Take 1-2 tablets (325-650 mg total) by mouth every 6 (six) hours as needed for mild pain or moderate pain (pain score 1-3 or temp > 100.5). (Patient taking differently: Take 650 mg by mouth every 6 (six) hours as needed for mild pain or moderate pain (pain score 1-3 or temp > 100.5). )  . apixaban (ELIQUIS) 2.5 MG TABS tablet Take 1 tablet (2.5 mg total) by mouth 2 (two) times daily.  [name]  B Complex-C (B-COMPLEX WITH VITAMIN C) tablet Take 1 tablet by mouth daily. Reported on 07/24/2015  . cholecalciferol (VITAMIN D) 1000 units tablet Take 1 tablet (1,000 Units total) by mouth daily. (Patient not taking: Reported on 04/30/2019)  . docusate sodium (COLACE) 100 MG capsule Take 1 capsule (100 mg total) by mouth 2 (two) times daily.  [name] donepezil (ARICEPT) 5 MG tablet One tablet daily (Patient taking differently: Take 5 mg by mouth at bedtime. One tablet daily)  . fluorometholone (FML) 0.1 %  ophthalmic suspension Place 1 drop into both eyes 4 (four) times daily.  . fluticasone (FLONASE) 50 MCG/ACT nasal spray Place 2 sprays into both nostrils at bedtime.  [name] HYDROcodone-acetaminophen (NORCO) 5-325 MG tablet Take 1-2 tablets by mouth 3 (three) times daily as needed.  [name] ipratropium (ATROVENT) 0.06 % nasal spray Place 2 sprays into the nose 2 (two) times daily as needed. Nasal drainage (Patient taking differently: Place 2 sprays into the nose 2 (two) times daily as needed (allergies). Nasal drainage)  . menthol-cetylpyridinium (CEPACOL) 3 MG lozenge Take 1 lozenge (3 mg total) by mouth as needed for sore throat (sore throat).  . methocarbamol (ROBAXIN) 500 MG tablet Take 1 tablet (500 mg total) by mouth every 6 (six) hours as needed for muscle spasms.  . metoprolol succinate (TOPROL-XL) 25 MG 24 hr tablet TAKE 1 TABLET BY MOUTH EVERYDAY AT BEDTIME (Patient taking differently: Take 25 mg by mouth at bedtime. TAKE 1 TABLET BY MOUTH EVERYDAY AT BEDTIME)  . mirabegron ER (MYRBETRIQ) 50 MG TB24 tablet Take 1 tablet (50 mg total) by mouth daily.  . Nutritional Supplement LIQD Take 120 mLs by mouth daily. MedPass  . phosphorus (K PHOS NEUTRAL) 155-852-130 MG tablet Take 2 tablets (500 mg total) by mouth 2 (two) times daily.  . polyethylene glycol (MIRALAX / GLYCOLAX) 17 g packet Take 17 g by mouth daily as needed for mild constipation.  . potassium chloride (MICRO-K) 10 MEQ CR capsule Take 1 capsule (10 mEq total) by mouth daily.  . pravastatin (PRAVACHOL) 20 MG tablet TAKE 1 TABLET BY MOUTH EVERYDAY AT BEDTIME  . senna-docusate (SENOKOT-S) 8.6-50 MG tablet Take 1 tablet by mouth at bedtime.  . sorbitol 70 % SOLN Take 30 mLs by mouth daily as needed for moderate constipation.  . torsemide (DEMADEX) 20 MG tablet Take 1 tablet (20 mg total) by mouth daily.  [name] XIIDRA 5 % SOLN Place 1 drop into both eyes 2 (two) times daily.   No facility-administered encounter medications on file as of 05/21/2019.     PHYSICAL EXAM:   General: NAD, frail appearing, thin resting in wheelchair Cardiovascular: regular rate and rhythm Pulmonary: clear througout Abdomen: soft, nontender, + bowel sounds Extremities: no edema, no joint deformities Skin: Reports of sacral decubitus but not visualized Neurological: A&O x2. Speech is fluid.  Generalized weakness.   Gonzella Lex, NP-C

## 2019-05-22 DIAGNOSIS — R195 Other fecal abnormalities: Secondary | ICD-10-CM | POA: Diagnosis not present

## 2019-05-22 DIAGNOSIS — R262 Difficulty in walking, not elsewhere classified: Secondary | ICD-10-CM | POA: Diagnosis not present

## 2019-05-22 DIAGNOSIS — S72001A Fracture of unspecified part of neck of right femur, initial encounter for closed fracture: Secondary | ICD-10-CM | POA: Diagnosis not present

## 2019-05-24 DIAGNOSIS — L89153 Pressure ulcer of sacral region, stage 3: Secondary | ICD-10-CM | POA: Diagnosis not present

## 2019-05-25 ENCOUNTER — Other Ambulatory Visit: Payer: Self-pay | Admitting: *Deleted

## 2019-05-25 NOTE — Patient Outreach (Signed)
Late entry for 05/24/19  Screened for potential [facility] Care Management needs as a benefit of  [employer].  Mr. Ryther is currently receiving skilled therapy at [facility].   Writer attended telephonic interdisciplinary team meeting to assess for disposition needs and transition plan for resident.   Facility reports member will transition to [facility] pending assessment. If accepted, member likely to transition to ALF next week.   Will continue to follow while member resides in SNF.   Marthenia Rolling, MSN-Ed, RN,BSN [place] Acute Care Coordinator [phone] [facility]) [phone]  (Toll free office)

## 2019-05-29 DIAGNOSIS — I10 Essential (primary) hypertension: Secondary | ICD-10-CM | POA: Diagnosis not present

## 2019-05-29 DIAGNOSIS — E440 Moderate protein-calorie malnutrition: Secondary | ICD-10-CM | POA: Diagnosis not present

## 2019-05-29 DIAGNOSIS — I5032 Chronic diastolic (congestive) heart failure: Secondary | ICD-10-CM | POA: Diagnosis not present

## 2019-05-29 DIAGNOSIS — I2510 Atherosclerotic heart disease of native coronary artery without angina pectoris: Secondary | ICD-10-CM | POA: Diagnosis not present

## 2019-05-29 DIAGNOSIS — S72001A Fracture of unspecified part of neck of right femur, initial encounter for closed fracture: Secondary | ICD-10-CM | POA: Diagnosis not present

## 2019-05-29 DIAGNOSIS — F0280 Dementia in other diseases classified elsewhere without behavioral disturbance: Secondary | ICD-10-CM | POA: Diagnosis not present

## 2019-05-29 DIAGNOSIS — R262 Difficulty in walking, not elsewhere classified: Secondary | ICD-10-CM | POA: Diagnosis not present

## 2019-06-07 ENCOUNTER — Other Ambulatory Visit: Payer: Self-pay | Admitting: Adult Health

## 2019-06-07 DIAGNOSIS — Z029 Encounter for administrative examinations, unspecified: Secondary | ICD-10-CM | POA: Diagnosis not present

## 2019-06-07 DIAGNOSIS — L89153 Pressure ulcer of sacral region, stage 3: Secondary | ICD-10-CM | POA: Diagnosis not present

## 2019-06-07 DIAGNOSIS — R32 Unspecified urinary incontinence: Secondary | ICD-10-CM

## 2019-06-07 DIAGNOSIS — E46 Unspecified protein-calorie malnutrition: Secondary | ICD-10-CM | POA: Diagnosis not present

## 2019-06-11 DIAGNOSIS — I480 Paroxysmal atrial fibrillation: Secondary | ICD-10-CM | POA: Diagnosis not present

## 2019-06-11 DIAGNOSIS — I5032 Chronic diastolic (congestive) heart failure: Secondary | ICD-10-CM | POA: Diagnosis not present

## 2019-06-11 DIAGNOSIS — S72001A Fracture of unspecified part of neck of right femur, initial encounter for closed fracture: Secondary | ICD-10-CM | POA: Diagnosis not present

## 2019-06-11 DIAGNOSIS — I872 Venous insufficiency (chronic) (peripheral): Secondary | ICD-10-CM | POA: Diagnosis not present

## 2019-06-12 ENCOUNTER — Other Ambulatory Visit: Payer: Self-pay

## 2019-06-12 ENCOUNTER — Encounter: Payer: Self-pay | Admitting: Orthopaedic Surgery

## 2019-06-12 ENCOUNTER — Ambulatory Visit (INDEPENDENT_AMBULATORY_CARE_PROVIDER_SITE_OTHER): Payer: Medicare Other | Admitting: Orthopaedic Surgery

## 2019-06-12 ENCOUNTER — Ambulatory Visit (INDEPENDENT_AMBULATORY_CARE_PROVIDER_SITE_OTHER): Payer: Medicare Other

## 2019-06-12 VITALS — Ht 69.0 in | Wt 121.0 lb

## 2019-06-12 DIAGNOSIS — L89153 Pressure ulcer of sacral region, stage 3: Secondary | ICD-10-CM | POA: Diagnosis not present

## 2019-06-12 DIAGNOSIS — I2510 Atherosclerotic heart disease of native coronary artery without angina pectoris: Secondary | ICD-10-CM | POA: Diagnosis not present

## 2019-06-12 DIAGNOSIS — S80829A Blister (nonthermal), unspecified lower leg, initial encounter: Secondary | ICD-10-CM | POA: Diagnosis not present

## 2019-06-12 DIAGNOSIS — I10 Essential (primary) hypertension: Secondary | ICD-10-CM | POA: Diagnosis not present

## 2019-06-12 DIAGNOSIS — F0280 Dementia in other diseases classified elsewhere without behavioral disturbance: Secondary | ICD-10-CM | POA: Diagnosis not present

## 2019-06-12 DIAGNOSIS — S7291XA Unspecified fracture of right femur, initial encounter for closed fracture: Secondary | ICD-10-CM | POA: Diagnosis not present

## 2019-06-12 DIAGNOSIS — Z8551 Personal history of malignant neoplasm of bladder: Secondary | ICD-10-CM | POA: Diagnosis not present

## 2019-06-12 DIAGNOSIS — I5032 Chronic diastolic (congestive) heart failure: Secondary | ICD-10-CM | POA: Diagnosis not present

## 2019-06-12 DIAGNOSIS — I872 Venous insufficiency (chronic) (peripheral): Secondary | ICD-10-CM | POA: Diagnosis not present

## 2019-06-12 DIAGNOSIS — I480 Paroxysmal atrial fibrillation: Secondary | ICD-10-CM | POA: Diagnosis not present

## 2019-06-12 DIAGNOSIS — I2729 Other secondary pulmonary hypertension: Secondary | ICD-10-CM | POA: Diagnosis not present

## 2019-06-12 DIAGNOSIS — Z96642 Presence of left artificial hip joint: Secondary | ICD-10-CM

## 2019-06-12 DIAGNOSIS — R262 Difficulty in walking, not elsewhere classified: Secondary | ICD-10-CM | POA: Diagnosis not present

## 2019-06-12 NOTE — Progress Notes (Signed)
Follow-up hemiarthroplasty most recently on the right from a fall with Dr. Sherrian Divers done on 05/03/2019.  Left hip was fixed by me posterior approach 03/26/2019.  Staples are out incision looks good.  He is planning on transitioning to a assisted living facility.  He can stand and take small steps with a shuffle gait.  He had problems with small bedsore over sacrum which is healed nicely.

## 2019-06-14 ENCOUNTER — Other Ambulatory Visit: Payer: Self-pay | Admitting: *Deleted

## 2019-06-14 DIAGNOSIS — L0390 Cellulitis, unspecified: Secondary | ICD-10-CM | POA: Diagnosis not present

## 2019-06-14 DIAGNOSIS — S80829A Blister (nonthermal), unspecified lower leg, initial encounter: Secondary | ICD-10-CM | POA: Diagnosis not present

## 2019-06-14 DIAGNOSIS — R600 Localized edema: Secondary | ICD-10-CM | POA: Diagnosis not present

## 2019-06-14 NOTE — Patient Outreach (Signed)
Screened for potential [facility] Care Management needs as a benefit of  [employer] Medicare.  Jeremy Kelley remains at [facility].   Writer attended telephonic interdisciplinary team meeting to assess for disposition needs and transition plan for resident.   Facility is hopeful that member will be accepted at [place] soon. ALF did not accept previously due to member's wound.  Will continue to follow while member resides in SNF.    Jeremy Rolling, MSN-Ed, RN,BSN [place] Acute Care Coordinator [phone] [facility]) [phone]  (Toll free office)

## 2019-06-15 ENCOUNTER — Ambulatory Visit: Payer: Medicare Other | Admitting: Physician Assistant

## 2019-06-16 DIAGNOSIS — G8929 Other chronic pain: Secondary | ICD-10-CM | POA: Diagnosis not present

## 2019-06-16 DIAGNOSIS — S72001D Fracture of unspecified part of neck of right femur, subsequent encounter for closed fracture with routine healing: Secondary | ICD-10-CM | POA: Diagnosis not present

## 2019-06-16 DIAGNOSIS — Z96641 Presence of right artificial hip joint: Secondary | ICD-10-CM | POA: Diagnosis not present

## 2019-06-16 DIAGNOSIS — L89153 Pressure ulcer of sacral region, stage 3: Secondary | ICD-10-CM | POA: Diagnosis not present

## 2019-06-16 DIAGNOSIS — I272 Pulmonary hypertension, unspecified: Secondary | ICD-10-CM | POA: Diagnosis not present

## 2019-06-16 DIAGNOSIS — Z7901 Long term (current) use of anticoagulants: Secondary | ICD-10-CM | POA: Diagnosis not present

## 2019-06-16 DIAGNOSIS — I2510 Atherosclerotic heart disease of native coronary artery without angina pectoris: Secondary | ICD-10-CM | POA: Diagnosis not present

## 2019-06-16 DIAGNOSIS — I110 Hypertensive heart disease with heart failure: Secondary | ICD-10-CM | POA: Diagnosis not present

## 2019-06-16 DIAGNOSIS — F0390 Unspecified dementia without behavioral disturbance: Secondary | ICD-10-CM | POA: Diagnosis not present

## 2019-06-16 DIAGNOSIS — S80822D Blister (nonthermal), left lower leg, subsequent encounter: Secondary | ICD-10-CM | POA: Diagnosis not present

## 2019-06-16 DIAGNOSIS — Z9181 History of falling: Secondary | ICD-10-CM | POA: Diagnosis not present

## 2019-06-16 DIAGNOSIS — Z8546 Personal history of malignant neoplasm of prostate: Secondary | ICD-10-CM | POA: Diagnosis not present

## 2019-06-16 DIAGNOSIS — M549 Dorsalgia, unspecified: Secondary | ICD-10-CM | POA: Diagnosis not present

## 2019-06-16 DIAGNOSIS — I482 Chronic atrial fibrillation, unspecified: Secondary | ICD-10-CM | POA: Diagnosis not present

## 2019-06-16 DIAGNOSIS — E785 Hyperlipidemia, unspecified: Secondary | ICD-10-CM | POA: Diagnosis not present

## 2019-06-16 DIAGNOSIS — I5032 Chronic diastolic (congestive) heart failure: Secondary | ICD-10-CM | POA: Diagnosis not present

## 2019-06-16 DIAGNOSIS — S80821D Blister (nonthermal), right lower leg, subsequent encounter: Secondary | ICD-10-CM | POA: Diagnosis not present

## 2019-06-18 ENCOUNTER — Other Ambulatory Visit: Payer: Self-pay | Admitting: *Deleted

## 2019-06-18 ENCOUNTER — Telehealth: Payer: Self-pay | Admitting: Adult Health

## 2019-06-18 DIAGNOSIS — I110 Hypertensive heart disease with heart failure: Secondary | ICD-10-CM | POA: Diagnosis not present

## 2019-06-18 DIAGNOSIS — S72001D Fracture of unspecified part of neck of right femur, subsequent encounter for closed fracture with routine healing: Secondary | ICD-10-CM | POA: Diagnosis not present

## 2019-06-18 DIAGNOSIS — L89153 Pressure ulcer of sacral region, stage 3: Secondary | ICD-10-CM | POA: Diagnosis not present

## 2019-06-18 DIAGNOSIS — S80822D Blister (nonthermal), left lower leg, subsequent encounter: Secondary | ICD-10-CM | POA: Diagnosis not present

## 2019-06-18 DIAGNOSIS — I5032 Chronic diastolic (congestive) heart failure: Secondary | ICD-10-CM | POA: Diagnosis not present

## 2019-06-18 DIAGNOSIS — S80821D Blister (nonthermal), right lower leg, subsequent encounter: Secondary | ICD-10-CM | POA: Diagnosis not present

## 2019-06-18 NOTE — Telephone Encounter (Signed)
Christine  From [facility] call stated pt have leg blisters and want to to xeroform dry dress 2 times a wk  And one on his sacrum  polymem  Clear dressing 2 time a wk. Christine want to know if she can see pt 2 time a wk for wound care.

## 2019-06-18 NOTE — Telephone Encounter (Signed)
Francee Nodal from [facility] would like verbal orders for PT to see pt 2 times a week for 4 weeks.    Francee Nodal: [phone]

## 2019-06-18 NOTE — Patient Outreach (Signed)
[facility] Post Acute Care Coordinator follow up  Confirmed with member's co- HCPOA, [name] (POAs wife), that member transition to [facility] ALF on 06/15/19.   No identifiable [facility] Care Management needs at this time.   Marthenia Rolling, MSN-Ed, RN,BSN [place] Acute Care Coordinator [phone] [facility]) [phone]  (Toll free office)

## 2019-06-19 ENCOUNTER — Telehealth: Payer: Self-pay | Admitting: Adult Health

## 2019-06-19 DIAGNOSIS — S80822D Blister (nonthermal), left lower leg, subsequent encounter: Secondary | ICD-10-CM | POA: Diagnosis not present

## 2019-06-19 DIAGNOSIS — S72001D Fracture of unspecified part of neck of right femur, subsequent encounter for closed fracture with routine healing: Secondary | ICD-10-CM | POA: Diagnosis not present

## 2019-06-19 DIAGNOSIS — I110 Hypertensive heart disease with heart failure: Secondary | ICD-10-CM | POA: Diagnosis not present

## 2019-06-19 DIAGNOSIS — S80821D Blister (nonthermal), right lower leg, subsequent encounter: Secondary | ICD-10-CM | POA: Diagnosis not present

## 2019-06-19 DIAGNOSIS — I5032 Chronic diastolic (congestive) heart failure: Secondary | ICD-10-CM | POA: Diagnosis not present

## 2019-06-19 DIAGNOSIS — L89153 Pressure ulcer of sacral region, stage 3: Secondary | ICD-10-CM | POA: Diagnosis not present

## 2019-06-19 NOTE — Telephone Encounter (Signed)
Richardson Landry notified to proceed with the orders.  Nothing further needed.

## 2019-06-19 NOTE — Telephone Encounter (Signed)
Ok for verbal orders ?

## 2019-06-19 NOTE — Telephone Encounter (Signed)
Jeremy Kelley is need verbal orders for pt for OT for 1 x for 8 weeks.

## 2019-06-19 NOTE — Telephone Encounter (Signed)
Noted  

## 2019-06-19 NOTE — Telephone Encounter (Signed)
Jeremy Kelley was returning Jeremy Kelley's call.   [phone] -ok to leave detailed message per [facility]

## 2019-06-19 NOTE — Telephone Encounter (Signed)
Left a message for a return call.

## 2019-06-19 NOTE — Telephone Encounter (Signed)
Charity from [employer] is faxing over care plan and physician orders for pt. She will need it signed and faxed back

## 2019-06-19 NOTE — Telephone Encounter (Signed)
Christine notified to proceed with orders.  Nothing further needed.

## 2019-06-19 NOTE — Telephone Encounter (Signed)
Received the fax from [facility] and placed in red folder

## 2019-06-19 NOTE — Telephone Encounter (Signed)
Received and [name] has signed.  Will fax.

## 2019-06-19 NOTE — Telephone Encounter (Signed)
Left a message for Melissa to proceed with OT.  Advised a call back if any questions.

## 2019-06-20 DIAGNOSIS — S80821D Blister (nonthermal), right lower leg, subsequent encounter: Secondary | ICD-10-CM | POA: Diagnosis not present

## 2019-06-20 DIAGNOSIS — L89153 Pressure ulcer of sacral region, stage 3: Secondary | ICD-10-CM | POA: Diagnosis not present

## 2019-06-20 DIAGNOSIS — S80822D Blister (nonthermal), left lower leg, subsequent encounter: Secondary | ICD-10-CM | POA: Diagnosis not present

## 2019-06-20 DIAGNOSIS — S72001D Fracture of unspecified part of neck of right femur, subsequent encounter for closed fracture with routine healing: Secondary | ICD-10-CM | POA: Diagnosis not present

## 2019-06-20 DIAGNOSIS — I5032 Chronic diastolic (congestive) heart failure: Secondary | ICD-10-CM | POA: Diagnosis not present

## 2019-06-20 DIAGNOSIS — I110 Hypertensive heart disease with heart failure: Secondary | ICD-10-CM | POA: Diagnosis not present

## 2019-06-21 DIAGNOSIS — S72001D Fracture of unspecified part of neck of right femur, subsequent encounter for closed fracture with routine healing: Secondary | ICD-10-CM | POA: Diagnosis not present

## 2019-06-21 DIAGNOSIS — L89153 Pressure ulcer of sacral region, stage 3: Secondary | ICD-10-CM | POA: Diagnosis not present

## 2019-06-21 DIAGNOSIS — S80821D Blister (nonthermal), right lower leg, subsequent encounter: Secondary | ICD-10-CM | POA: Diagnosis not present

## 2019-06-21 DIAGNOSIS — S80822D Blister (nonthermal), left lower leg, subsequent encounter: Secondary | ICD-10-CM | POA: Diagnosis not present

## 2019-06-21 DIAGNOSIS — I110 Hypertensive heart disease with heart failure: Secondary | ICD-10-CM | POA: Diagnosis not present

## 2019-06-21 DIAGNOSIS — I5032 Chronic diastolic (congestive) heart failure: Secondary | ICD-10-CM | POA: Diagnosis not present

## 2019-06-22 ENCOUNTER — Encounter: Payer: Self-pay | Admitting: Orthopaedic Surgery

## 2019-06-22 ENCOUNTER — Ambulatory Visit (INDEPENDENT_AMBULATORY_CARE_PROVIDER_SITE_OTHER): Payer: Medicare Other

## 2019-06-22 ENCOUNTER — Telehealth: Payer: Self-pay | Admitting: Adult Health

## 2019-06-22 ENCOUNTER — Encounter: Payer: Self-pay | Admitting: Family Medicine

## 2019-06-22 ENCOUNTER — Other Ambulatory Visit: Payer: Self-pay

## 2019-06-22 ENCOUNTER — Ambulatory Visit (INDEPENDENT_AMBULATORY_CARE_PROVIDER_SITE_OTHER): Payer: Medicare Other | Admitting: Orthopaedic Surgery

## 2019-06-22 VITALS — Ht 69.0 in | Wt 121.0 lb

## 2019-06-22 DIAGNOSIS — Z96649 Presence of unspecified artificial hip joint: Secondary | ICD-10-CM

## 2019-06-22 NOTE — Progress Notes (Signed)
Post-Op Visit Note   Patient: Jeremy Kelley           Date of Birth: 1927/03/22           MRN: [id number] Visit Date: 06/22/2019 PCP: Jeremy Peng, NP   Assessment & Plan:  Chief Complaint:  Chief Complaint  Patient presents with  . Right Hip - Follow-up    Right hemi hip arthroplasty 05/03/2019   Visit Diagnoses:  1. S/P hip hemiarthroplasty     Plan: [name] is 7 weeks status post right hip hemiarthroplasty.  He denies any pain in his right hip.  He is ambulating with a walker.  He has wounds on both of his legs.  He has very frail skin.  These do not appear to be infected but he does have subcutaneous hematomas on both legs.  These were rewrapped today and I have recommended a wound consult by his facility for management of these.  From my standpoint he is doing well can follow-up with [place] as needed.  Follow-Up Instructions: Return if symptoms worsen or fail to improve.   Orders:  Orders Placed This Encounter  Procedures  . XR HIP UNILAT W OR W/O PELVIS 2-3 VIEWS RIGHT   No orders of the defined types were placed in this encounter.   Imaging: XR HIP UNILAT W OR W/O PELVIS 2-3 VIEWS RIGHT  Result Date: 06/22/2019 Stable partial hip replacement without complication.   PMFS History: Patient Active Problem List   Diagnosis Date Noted  . Pressure injury of skin 05/02/2019  . Displaced fracture of right femoral neck ([place]) 04/30/2019  . DNR (do not resuscitate) 04/30/2019  . Fall at home, initial encounter 04/30/2019  . History of hemiarthroplasty of left hip 04/25/2019  . Hip fracture ([place]) 04/21/2019  . Dementia ([place]) 04/08/2019  . S/P hip hemiarthroplasty   . Small bowel obstruction ([place]) 02/21/2019  . Closed pilon fracture, left, sequela 10/11/2016  . Pain in left ankle and joints of left foot 10/04/2016  . Idiopathic chronic venous hypertension of left lower extremity with ulcer and inflammation ([place]) 10/04/2016  . Fatigue 11/04/2015  . CAD (coronary  artery disease) 08/01/2015  . Pulmonary hypertension ([place]) 07/24/2015  . Atrial fibrillation, chronic ([place]) 07/16/2015  . Aortic insufficiency 01/04/2014  . Squamous cell carcinoma in situ of skin of right lower leg 11/13/2013  . Lentigo maligna of cheek ([place]) 09/19/2013  . Actinic keratosis 11/06/2012  . Macular degeneration 08/10/2012  . Allergic rhinitis 08/10/2012  . Bladder cancer ([place]) 10/19/2011  . Eczema 02/23/2011  . Hyperglycemia 02/11/2011  . Heart murmur 09/10/2010  . ABNORMAL THYROID FUNCTION TESTS 08/05/2009  . ABNORMAL HEART RHYTHMS 07/11/2009  . Hyperlipidemia 11/28/2008  . CAROTID BRUIT 11/19/2008  . UNSPECIFIED CATARACT 10/17/2008  . PARESTHESIA 12/04/2007  . Varicose veins of bilateral lower extremities with other complications [id number]  . COLONIC POLYPS, HX OF 08/28/2007  . Essential hypertension 02/08/2007  . PROSTATE CANCER, HX OF 11/04/2006  . INGUINAL HERNIA, HX OF 11/04/2006   Past Medical History:  Diagnosis Date  . Arthritis   . Carotid artery stenosis 11/2008   bilateral 40-59% stenosis  . Colon polyps   . Diverticulosis   . Hepatitis    doesn't know which type 1969  . Hyperlipidemia   . Hypertension   . Numbness    fingertips  . Phimosis   . Prostate cancer ([place]) 2001  . 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 Colon cancer Neg Hx   . Colon polyps Neg Hx     Past Surgical History:  Procedure Laterality Date  . BIOPSY  10/01/2011   Procedure: BIOPSY;  Surgeon: [name], MD;  Location: [facility];  Service: Urology;;  biopsy of bladder tumor  . BLADDER SUSPENSION  08/18/10   [name]  . CARDIAC CATHETERIZATION N/A 07/29/2015   Procedure: Right/Left Heart Cath and Coronary Angiography;  Surgeon: Peter M Martinique, MD;  Location: [facility];  Service: Cardiovascular;  Laterality: N/A;  . CATARACT EXTRACTION W/ INTRAOCULAR LENS  IMPLANT, BILATERAL Bilateral   . CYSTOSCOPY  W/ RETROGRADES  01/27/2012   Procedure: CYSTOSCOPY WITH RETROGRADE PYELOGRAM;  Surgeon: [name], MD;  Location: [facility];  Service: Urology;  Laterality: Left;  . CYSTOSCOPY WITH RETROGRADE PYELOGRAM, URETEROSCOPY AND STENT PLACEMENT Left 11/24/2012   Procedure: CYSTOSCOPY WITH left RETROGRADE PYELOGRAM, bladder washings, ureteroscopy;  Surgeon: [name], MD;  Location: [facility];  Service: Urology;  Laterality: Left;  . CYSTOSCOPY WITH RETROGRADE PYELOGRAM, URETEROSCOPY AND STENT PLACEMENT Bilateral 11/19/2013   Procedure: CYSTOSCOPY WITH BILATERAL RETROGRADE PYELOGRAM,;  Surgeon: [name], MD;  Location: [facility];  Service: Urology;  Laterality: Bilateral;  . EYE SURGERY  09/24/10   left eye. "Retina surgery"  . HIP ARTHROPLASTY Left 03/26/2019   Procedure: ARTHROPLASTY BIPOLAR HIP (HEMIARTHROPLASTY);  Surgeon: Marybelle Killings, MD;  Location: [facility];  Service: Orthopedics;  Laterality: Left;  Depuy press fit, lateral position  . INGUINAL HERNIA REPAIR  1960  . PROSTATE SURGERY     s/p laparoscopic prostate surgery  . SKIN BIOPSY  05/20/2017   Superficial and nodular basal cell carcinoma (left temple)    basal cell carcinoma with sclerosis (right temple)  . 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 SKIN CANCER EXCISION     a/p skin cancer right foot-non melanoma  . skin laceration    . TEE WITHOUT CARDIOVERSION N/A 07/29/2015   Procedure: TRANSESOPHAGEAL ECHOCARDIOGRAM (TEE);  Surgeon: Pixie Casino, MD;  Location: [facility];  Service: Cardiovascular;  Laterality: N/A;  cath to follow  . TOTAL HIP ARTHROPLASTY Right 05/03/2019   Procedure: RIGHT HEMI HIP ARTHROPLASTY ANTERIOR APPROACH;  Surgeon: Leandrew Koyanagi, MD;  Location: [facility];  Service: Orthopedics;  Laterality: Right;  WIll Need Retractor Holder  . TRANSURETHRAL RESECTION OF BLADDER TUMOR  10/01/2011   Procedure: TRANSURETHRAL RESECTION OF BLADDER TUMOR (TURBT);  Surgeon: [name], MD;  Location: [facility];  Service: Urology;  Laterality: N/A;  . TRANSURETHRAL RESECTION OF BLADDER TUMOR N/A 11/19/2013   Procedure: TRANSURETHRAL RESECTION OF BLADDER TUMOR (TURBT);  Surgeon: [name], MD;  Location[facility];  Service: Urology;  Laterality: N/A;  . URETEROSCOPY  01/27/2012   Procedure: URETEROSCOPY;  Surgeon: [name], MD;  Location[facility];  Service: Urology;  Laterality: Left;  CYSTO, Left RETROGRADE PYELOGRPHY, LEFT URETEROSCOPY   . URINARY SPHINCTER IMPLANT    . VARICOSE VEIN SURGERY     Social History   Occupational History  . Occupation: Retired  Tobacco Use  . Smoking status: Never Smoker  . Smokeless tobacco: Never Used  . Tobacco comment: 07/29/2015 "might smoke a cigar once/year"  Substance and Sexual Activity  . Alcohol use: Yes    Alcohol/week: 14.0 standard drinks    Types: 14 Glasses of wine per week    Comment: 07/29/2015 "big glass of wine q night"  .  Drug use: No  . Sexual activity: Not on file

## 2019-06-22 NOTE — Telephone Encounter (Signed)
Orders signed and given to you this morning.   Ok for Imodium order " take 4 mg followed by 2 mg after each unformed stool until diarrhea is controlled

## 2019-06-22 NOTE — Telephone Encounter (Signed)
Ok for verbal orders ?

## 2019-06-22 NOTE — Telephone Encounter (Signed)
[facility] faxed over orders for pt's care Plan, Physician orders and form to be completed by PCP recently and needs it faxed over ASAP.  They also stated that he is having diarrhea and would like a prescription for Imodium faxed at [phone] They would like it sent in before 4pm today b/c their pharmacy will close for the weekend.   Phone: [phone] if any questions

## 2019-06-22 NOTE — Telephone Encounter (Signed)
All have been faxed and received confirmation of successful transactions.  Nothing further needed.

## 2019-06-22 NOTE — Telephone Encounter (Signed)
Anna Genre, speech therapist at [facility] is calling for verbal orders for speech therapy e-val 1w1 effective next week.   Anna Genre can be reached at [phone] -ok to leave detailed message per Anna Genre   She stated that she had the order for this week but pt requested to move it to next week.

## 2019-06-25 DIAGNOSIS — S72001D Fracture of unspecified part of neck of right femur, subsequent encounter for closed fracture with routine healing: Secondary | ICD-10-CM | POA: Diagnosis not present

## 2019-06-25 DIAGNOSIS — S80822D Blister (nonthermal), left lower leg, subsequent encounter: Secondary | ICD-10-CM | POA: Diagnosis not present

## 2019-06-25 DIAGNOSIS — L89153 Pressure ulcer of sacral region, stage 3: Secondary | ICD-10-CM | POA: Diagnosis not present

## 2019-06-25 DIAGNOSIS — S80821D Blister (nonthermal), right lower leg, subsequent encounter: Secondary | ICD-10-CM | POA: Diagnosis not present

## 2019-06-25 DIAGNOSIS — I110 Hypertensive heart disease with heart failure: Secondary | ICD-10-CM | POA: Diagnosis not present

## 2019-06-25 DIAGNOSIS — I5032 Chronic diastolic (congestive) heart failure: Secondary | ICD-10-CM | POA: Diagnosis not present

## 2019-06-26 ENCOUNTER — Telehealth: Payer: Self-pay | Admitting: Adult Health

## 2019-06-26 DIAGNOSIS — I5032 Chronic diastolic (congestive) heart failure: Secondary | ICD-10-CM | POA: Diagnosis not present

## 2019-06-26 DIAGNOSIS — S80821D Blister (nonthermal), right lower leg, subsequent encounter: Secondary | ICD-10-CM | POA: Diagnosis not present

## 2019-06-26 DIAGNOSIS — L89153 Pressure ulcer of sacral region, stage 3: Secondary | ICD-10-CM | POA: Diagnosis not present

## 2019-06-26 DIAGNOSIS — S80822D Blister (nonthermal), left lower leg, subsequent encounter: Secondary | ICD-10-CM | POA: Diagnosis not present

## 2019-06-26 DIAGNOSIS — I110 Hypertensive heart disease with heart failure: Secondary | ICD-10-CM | POA: Diagnosis not present

## 2019-06-26 DIAGNOSIS — S72001D Fracture of unspecified part of neck of right femur, subsequent encounter for closed fracture with routine healing: Secondary | ICD-10-CM | POA: Diagnosis not present

## 2019-06-26 NOTE — Telephone Encounter (Signed)
That's fine

## 2019-06-26 NOTE — Telephone Encounter (Signed)
Jeremy Kelley or Jeremy Kelley [phone] option 2  The patient is now over at [facility] and they want to know if they can follow his care to [facility].  Please advise

## 2019-06-26 NOTE — Telephone Encounter (Signed)
Left a detailed message informing Anna Genre to proceed with speech therapy.  Nothing further needed.

## 2019-06-26 NOTE — Telephone Encounter (Signed)
Spoke to [place] and advised to proceed with orders.  Nothing further needed.

## 2019-06-27 DIAGNOSIS — S80821D Blister (nonthermal), right lower leg, subsequent encounter: Secondary | ICD-10-CM | POA: Diagnosis not present

## 2019-06-27 DIAGNOSIS — I5032 Chronic diastolic (congestive) heart failure: Secondary | ICD-10-CM | POA: Diagnosis not present

## 2019-06-27 DIAGNOSIS — L89153 Pressure ulcer of sacral region, stage 3: Secondary | ICD-10-CM | POA: Diagnosis not present

## 2019-06-27 DIAGNOSIS — A1889 Tuberculosis of other sites: Secondary | ICD-10-CM | POA: Diagnosis not present

## 2019-06-27 DIAGNOSIS — S72001D Fracture of unspecified part of neck of right femur, subsequent encounter for closed fracture with routine healing: Secondary | ICD-10-CM | POA: Diagnosis not present

## 2019-06-27 DIAGNOSIS — S80822D Blister (nonthermal), left lower leg, subsequent encounter: Secondary | ICD-10-CM | POA: Diagnosis not present

## 2019-06-27 DIAGNOSIS — I110 Hypertensive heart disease with heart failure: Secondary | ICD-10-CM | POA: Diagnosis not present

## 2019-06-28 ENCOUNTER — Telehealth: Payer: Self-pay | Admitting: Adult Health

## 2019-06-28 DIAGNOSIS — I5032 Chronic diastolic (congestive) heart failure: Secondary | ICD-10-CM | POA: Diagnosis not present

## 2019-06-28 DIAGNOSIS — S72001D Fracture of unspecified part of neck of right femur, subsequent encounter for closed fracture with routine healing: Secondary | ICD-10-CM | POA: Diagnosis not present

## 2019-06-28 DIAGNOSIS — I110 Hypertensive heart disease with heart failure: Secondary | ICD-10-CM | POA: Diagnosis not present

## 2019-06-28 DIAGNOSIS — L89153 Pressure ulcer of sacral region, stage 3: Secondary | ICD-10-CM | POA: Diagnosis not present

## 2019-06-28 DIAGNOSIS — S80822D Blister (nonthermal), left lower leg, subsequent encounter: Secondary | ICD-10-CM | POA: Diagnosis not present

## 2019-06-28 DIAGNOSIS — S80821D Blister (nonthermal), right lower leg, subsequent encounter: Secondary | ICD-10-CM | POA: Diagnosis not present

## 2019-06-28 NOTE — Telephone Encounter (Signed)
Jeremy Kelley from [facility] would like verbal orders to apply desatin 40% to his sacrum area daily and PRN redness.

## 2019-06-28 NOTE — Telephone Encounter (Signed)
Ok for verbal orders ?

## 2019-06-28 NOTE — Telephone Encounter (Signed)
Left a call for a return call.

## 2019-06-29 DIAGNOSIS — S80821D Blister (nonthermal), right lower leg, subsequent encounter: Secondary | ICD-10-CM | POA: Diagnosis not present

## 2019-06-29 DIAGNOSIS — I5032 Chronic diastolic (congestive) heart failure: Secondary | ICD-10-CM | POA: Diagnosis not present

## 2019-06-29 DIAGNOSIS — I110 Hypertensive heart disease with heart failure: Secondary | ICD-10-CM | POA: Diagnosis not present

## 2019-06-29 DIAGNOSIS — L89153 Pressure ulcer of sacral region, stage 3: Secondary | ICD-10-CM | POA: Diagnosis not present

## 2019-06-29 DIAGNOSIS — S72001D Fracture of unspecified part of neck of right femur, subsequent encounter for closed fracture with routine healing: Secondary | ICD-10-CM | POA: Diagnosis not present

## 2019-06-29 DIAGNOSIS — S80822D Blister (nonthermal), left lower leg, subsequent encounter: Secondary | ICD-10-CM | POA: Diagnosis not present

## 2019-06-29 NOTE — Telephone Encounter (Signed)
Received a fax from [facility].  Will have Cory sign and I will return fax.

## 2019-06-29 NOTE — Telephone Encounter (Signed)
Tried to reach Jeremy Kelley.  The call was disconnected.  Will try again at a later time.

## 2019-07-02 ENCOUNTER — Telehealth: Payer: Self-pay | Admitting: Adult Health

## 2019-07-02 DIAGNOSIS — I5032 Chronic diastolic (congestive) heart failure: Secondary | ICD-10-CM | POA: Diagnosis not present

## 2019-07-02 DIAGNOSIS — S72001D Fracture of unspecified part of neck of right femur, subsequent encounter for closed fracture with routine healing: Secondary | ICD-10-CM | POA: Diagnosis not present

## 2019-07-02 DIAGNOSIS — L89153 Pressure ulcer of sacral region, stage 3: Secondary | ICD-10-CM | POA: Diagnosis not present

## 2019-07-02 DIAGNOSIS — I110 Hypertensive heart disease with heart failure: Secondary | ICD-10-CM | POA: Diagnosis not present

## 2019-07-02 DIAGNOSIS — S80822D Blister (nonthermal), left lower leg, subsequent encounter: Secondary | ICD-10-CM | POA: Diagnosis not present

## 2019-07-02 DIAGNOSIS — S80821D Blister (nonthermal), right lower leg, subsequent encounter: Secondary | ICD-10-CM | POA: Diagnosis not present

## 2019-07-02 NOTE — Telephone Encounter (Signed)
Janett Billow, RN with [facility] is calling to see if the message from last week has been seen about (desitin for the sacrum area) as well as needing to ncrease his wound care to 3 times a week for the wound on his leg the bandages is not staying on.  She would like to see if she can get a call back as soon as possible she is aware that [name] is out of the office on Mondays and will return the call as possible.

## 2019-07-03 DIAGNOSIS — I5032 Chronic diastolic (congestive) heart failure: Secondary | ICD-10-CM | POA: Diagnosis not present

## 2019-07-03 DIAGNOSIS — S80822D Blister (nonthermal), left lower leg, subsequent encounter: Secondary | ICD-10-CM | POA: Diagnosis not present

## 2019-07-03 DIAGNOSIS — I110 Hypertensive heart disease with heart failure: Secondary | ICD-10-CM | POA: Diagnosis not present

## 2019-07-03 DIAGNOSIS — L89153 Pressure ulcer of sacral region, stage 3: Secondary | ICD-10-CM | POA: Diagnosis not present

## 2019-07-03 DIAGNOSIS — S80821D Blister (nonthermal), right lower leg, subsequent encounter: Secondary | ICD-10-CM | POA: Diagnosis not present

## 2019-07-03 DIAGNOSIS — S72001D Fracture of unspecified part of neck of right femur, subsequent encounter for closed fracture with routine healing: Secondary | ICD-10-CM | POA: Diagnosis not present

## 2019-07-03 NOTE — Telephone Encounter (Signed)
Order for [place] faxed in last week.  [place] for wound care orders?

## 2019-07-03 NOTE — Telephone Encounter (Signed)
Ok for wound care orders.

## 2019-07-03 NOTE — Telephone Encounter (Signed)
Spoke to Jeremy Kelley and informed her that the Desitin order was faxed in last week.  She will check with the facility to make sure it was received.  Gave authorization from [place] for wound care orders.  Nothing further needed.

## 2019-07-04 ENCOUNTER — Encounter: Payer: Self-pay | Admitting: Family Medicine

## 2019-07-04 ENCOUNTER — Encounter: Payer: Medicare Other | Admitting: Internal Medicine

## 2019-07-04 DIAGNOSIS — I110 Hypertensive heart disease with heart failure: Secondary | ICD-10-CM | POA: Diagnosis not present

## 2019-07-04 DIAGNOSIS — I5032 Chronic diastolic (congestive) heart failure: Secondary | ICD-10-CM | POA: Diagnosis not present

## 2019-07-04 DIAGNOSIS — L89153 Pressure ulcer of sacral region, stage 3: Secondary | ICD-10-CM | POA: Diagnosis not present

## 2019-07-04 DIAGNOSIS — S72001D Fracture of unspecified part of neck of right femur, subsequent encounter for closed fracture with routine healing: Secondary | ICD-10-CM | POA: Diagnosis not present

## 2019-07-04 DIAGNOSIS — S80822D Blister (nonthermal), left lower leg, subsequent encounter: Secondary | ICD-10-CM | POA: Diagnosis not present

## 2019-07-04 DIAGNOSIS — S80821D Blister (nonthermal), right lower leg, subsequent encounter: Secondary | ICD-10-CM | POA: Diagnosis not present

## 2019-07-04 NOTE — Telephone Encounter (Signed)
Estela Med Tech at [facility] stated that she is needing the orders signed for wound care, crush is meds, eye drops and change the laxatif to prn as needed and faxed back.   Phone: [phone]

## 2019-07-04 NOTE — Telephone Encounter (Signed)
Returned call to [place] but was notified that she had left for the day.  Order faxed to [phone].  Received confirmation the fax was successful.  Nothing further needed.

## 2019-07-05 ENCOUNTER — Telehealth: Payer: Self-pay | Admitting: Adult Health

## 2019-07-05 ENCOUNTER — Other Ambulatory Visit: Payer: Self-pay

## 2019-07-05 DIAGNOSIS — I5032 Chronic diastolic (congestive) heart failure: Secondary | ICD-10-CM | POA: Diagnosis not present

## 2019-07-05 DIAGNOSIS — T8189XA Other complications of procedures, not elsewhere classified, initial encounter: Secondary | ICD-10-CM | POA: Diagnosis not present

## 2019-07-05 DIAGNOSIS — S80821D Blister (nonthermal), right lower leg, subsequent encounter: Secondary | ICD-10-CM | POA: Diagnosis not present

## 2019-07-05 DIAGNOSIS — L089 Local infection of the skin and subcutaneous tissue, unspecified: Secondary | ICD-10-CM | POA: Diagnosis not present

## 2019-07-05 DIAGNOSIS — S72001D Fracture of unspecified part of neck of right femur, subsequent encounter for closed fracture with routine healing: Secondary | ICD-10-CM | POA: Diagnosis not present

## 2019-07-05 DIAGNOSIS — I110 Hypertensive heart disease with heart failure: Secondary | ICD-10-CM | POA: Diagnosis not present

## 2019-07-05 DIAGNOSIS — L89153 Pressure ulcer of sacral region, stage 3: Secondary | ICD-10-CM | POA: Diagnosis not present

## 2019-07-05 DIAGNOSIS — S80822D Blister (nonthermal), left lower leg, subsequent encounter: Secondary | ICD-10-CM | POA: Diagnosis not present

## 2019-07-05 DIAGNOSIS — S81802A Unspecified open wound, left lower leg, initial encounter: Secondary | ICD-10-CM | POA: Diagnosis not present

## 2019-07-05 NOTE — Telephone Encounter (Signed)
Daily PRN

## 2019-07-05 NOTE — Telephone Encounter (Signed)
Once daily PRN

## 2019-07-05 NOTE — Telephone Encounter (Signed)
[facility] is calling needing to know the frequency for the pt taking miralax they have the dosage but need to know how often pt can have it.

## 2019-07-05 NOTE — Telephone Encounter (Signed)
Spoke with Janett Billow and Buddy Duty and gave her instructions from [place].  Once daily PRN.  Nothing further needed.

## 2019-07-06 DIAGNOSIS — L89153 Pressure ulcer of sacral region, stage 3: Secondary | ICD-10-CM | POA: Diagnosis not present

## 2019-07-06 DIAGNOSIS — S80822D Blister (nonthermal), left lower leg, subsequent encounter: Secondary | ICD-10-CM | POA: Diagnosis not present

## 2019-07-06 DIAGNOSIS — S72001D Fracture of unspecified part of neck of right femur, subsequent encounter for closed fracture with routine healing: Secondary | ICD-10-CM | POA: Diagnosis not present

## 2019-07-06 DIAGNOSIS — I110 Hypertensive heart disease with heart failure: Secondary | ICD-10-CM | POA: Diagnosis not present

## 2019-07-06 DIAGNOSIS — I5032 Chronic diastolic (congestive) heart failure: Secondary | ICD-10-CM | POA: Diagnosis not present

## 2019-07-06 DIAGNOSIS — S80821D Blister (nonthermal), right lower leg, subsequent encounter: Secondary | ICD-10-CM | POA: Diagnosis not present

## 2019-07-09 DIAGNOSIS — S80821D Blister (nonthermal), right lower leg, subsequent encounter: Secondary | ICD-10-CM | POA: Diagnosis not present

## 2019-07-09 DIAGNOSIS — L89153 Pressure ulcer of sacral region, stage 3: Secondary | ICD-10-CM | POA: Diagnosis not present

## 2019-07-09 DIAGNOSIS — I5032 Chronic diastolic (congestive) heart failure: Secondary | ICD-10-CM | POA: Diagnosis not present

## 2019-07-09 DIAGNOSIS — S72001D Fracture of unspecified part of neck of right femur, subsequent encounter for closed fracture with routine healing: Secondary | ICD-10-CM | POA: Diagnosis not present

## 2019-07-09 DIAGNOSIS — S80822D Blister (nonthermal), left lower leg, subsequent encounter: Secondary | ICD-10-CM | POA: Diagnosis not present

## 2019-07-09 DIAGNOSIS — I110 Hypertensive heart disease with heart failure: Secondary | ICD-10-CM | POA: Diagnosis not present

## 2019-07-10 DIAGNOSIS — I110 Hypertensive heart disease with heart failure: Secondary | ICD-10-CM | POA: Diagnosis not present

## 2019-07-10 DIAGNOSIS — I5032 Chronic diastolic (congestive) heart failure: Secondary | ICD-10-CM | POA: Diagnosis not present

## 2019-07-10 DIAGNOSIS — S80822D Blister (nonthermal), left lower leg, subsequent encounter: Secondary | ICD-10-CM | POA: Diagnosis not present

## 2019-07-10 DIAGNOSIS — L89153 Pressure ulcer of sacral region, stage 3: Secondary | ICD-10-CM | POA: Diagnosis not present

## 2019-07-10 DIAGNOSIS — S80821D Blister (nonthermal), right lower leg, subsequent encounter: Secondary | ICD-10-CM | POA: Diagnosis not present

## 2019-07-10 DIAGNOSIS — S72001D Fracture of unspecified part of neck of right femur, subsequent encounter for closed fracture with routine healing: Secondary | ICD-10-CM | POA: Diagnosis not present

## 2019-07-11 DIAGNOSIS — Z22322 Carrier or suspected carrier of Methicillin resistant Staphylococcus aureus: Secondary | ICD-10-CM | POA: Diagnosis not present

## 2019-07-11 DIAGNOSIS — R350 Frequency of micturition: Secondary | ICD-10-CM | POA: Diagnosis not present

## 2019-07-11 DIAGNOSIS — I10 Essential (primary) hypertension: Secondary | ICD-10-CM | POA: Diagnosis not present

## 2019-07-11 DIAGNOSIS — A4902 Methicillin resistant Staphylococcus aureus infection, unspecified site: Secondary | ICD-10-CM | POA: Diagnosis not present

## 2019-07-11 DIAGNOSIS — S81802A Unspecified open wound, left lower leg, initial encounter: Secondary | ICD-10-CM | POA: Diagnosis not present

## 2019-07-12 DIAGNOSIS — Z79899 Other long term (current) drug therapy: Secondary | ICD-10-CM | POA: Diagnosis not present

## 2019-07-13 DIAGNOSIS — I5032 Chronic diastolic (congestive) heart failure: Secondary | ICD-10-CM | POA: Diagnosis not present

## 2019-07-13 DIAGNOSIS — S80822D Blister (nonthermal), left lower leg, subsequent encounter: Secondary | ICD-10-CM | POA: Diagnosis not present

## 2019-07-13 DIAGNOSIS — S80821D Blister (nonthermal), right lower leg, subsequent encounter: Secondary | ICD-10-CM | POA: Diagnosis not present

## 2019-07-13 DIAGNOSIS — S72001D Fracture of unspecified part of neck of right femur, subsequent encounter for closed fracture with routine healing: Secondary | ICD-10-CM | POA: Diagnosis not present

## 2019-07-13 DIAGNOSIS — L89153 Pressure ulcer of sacral region, stage 3: Secondary | ICD-10-CM | POA: Diagnosis not present

## 2019-07-13 DIAGNOSIS — I110 Hypertensive heart disease with heart failure: Secondary | ICD-10-CM | POA: Diagnosis not present

## 2019-07-16 DIAGNOSIS — Z9181 History of falling: Secondary | ICD-10-CM | POA: Diagnosis not present

## 2019-07-16 DIAGNOSIS — F0390 Unspecified dementia without behavioral disturbance: Secondary | ICD-10-CM | POA: Diagnosis not present

## 2019-07-16 DIAGNOSIS — S80822D Blister (nonthermal), left lower leg, subsequent encounter: Secondary | ICD-10-CM | POA: Diagnosis not present

## 2019-07-16 DIAGNOSIS — L89153 Pressure ulcer of sacral region, stage 3: Secondary | ICD-10-CM | POA: Diagnosis not present

## 2019-07-16 DIAGNOSIS — G8929 Other chronic pain: Secondary | ICD-10-CM | POA: Diagnosis not present

## 2019-07-16 DIAGNOSIS — S80821D Blister (nonthermal), right lower leg, subsequent encounter: Secondary | ICD-10-CM | POA: Diagnosis not present

## 2019-07-16 DIAGNOSIS — I110 Hypertensive heart disease with heart failure: Secondary | ICD-10-CM | POA: Diagnosis not present

## 2019-07-16 DIAGNOSIS — I482 Chronic atrial fibrillation, unspecified: Secondary | ICD-10-CM | POA: Diagnosis not present

## 2019-07-16 DIAGNOSIS — I5032 Chronic diastolic (congestive) heart failure: Secondary | ICD-10-CM | POA: Diagnosis not present

## 2019-07-16 DIAGNOSIS — Z96641 Presence of right artificial hip joint: Secondary | ICD-10-CM | POA: Diagnosis not present

## 2019-07-16 DIAGNOSIS — M549 Dorsalgia, unspecified: Secondary | ICD-10-CM | POA: Diagnosis not present

## 2019-07-16 DIAGNOSIS — I2510 Atherosclerotic heart disease of native coronary artery without angina pectoris: Secondary | ICD-10-CM | POA: Diagnosis not present

## 2019-07-16 DIAGNOSIS — Z8546 Personal history of malignant neoplasm of prostate: Secondary | ICD-10-CM | POA: Diagnosis not present

## 2019-07-16 DIAGNOSIS — I272 Pulmonary hypertension, unspecified: Secondary | ICD-10-CM | POA: Diagnosis not present

## 2019-07-16 DIAGNOSIS — E785 Hyperlipidemia, unspecified: Secondary | ICD-10-CM | POA: Diagnosis not present

## 2019-07-16 DIAGNOSIS — S72001D Fracture of unspecified part of neck of right femur, subsequent encounter for closed fracture with routine healing: Secondary | ICD-10-CM | POA: Diagnosis not present

## 2019-07-16 DIAGNOSIS — Z7901 Long term (current) use of anticoagulants: Secondary | ICD-10-CM | POA: Diagnosis not present

## 2019-07-18 DIAGNOSIS — S72001D Fracture of unspecified part of neck of right femur, subsequent encounter for closed fracture with routine healing: Secondary | ICD-10-CM | POA: Diagnosis not present

## 2019-07-18 DIAGNOSIS — I5032 Chronic diastolic (congestive) heart failure: Secondary | ICD-10-CM | POA: Diagnosis not present

## 2019-07-18 DIAGNOSIS — Z1159 Encounter for screening for other viral diseases: Secondary | ICD-10-CM | POA: Diagnosis not present

## 2019-07-18 DIAGNOSIS — S80821D Blister (nonthermal), right lower leg, subsequent encounter: Secondary | ICD-10-CM | POA: Diagnosis not present

## 2019-07-18 DIAGNOSIS — I110 Hypertensive heart disease with heart failure: Secondary | ICD-10-CM | POA: Diagnosis not present

## 2019-07-18 DIAGNOSIS — Z20828 Contact with and (suspected) exposure to other viral communicable diseases: Secondary | ICD-10-CM | POA: Diagnosis not present

## 2019-07-18 DIAGNOSIS — S80822D Blister (nonthermal), left lower leg, subsequent encounter: Secondary | ICD-10-CM | POA: Diagnosis not present

## 2019-07-18 DIAGNOSIS — L89153 Pressure ulcer of sacral region, stage 3: Secondary | ICD-10-CM | POA: Diagnosis not present

## 2019-07-19 DIAGNOSIS — I110 Hypertensive heart disease with heart failure: Secondary | ICD-10-CM | POA: Diagnosis not present

## 2019-07-19 DIAGNOSIS — S72001D Fracture of unspecified part of neck of right femur, subsequent encounter for closed fracture with routine healing: Secondary | ICD-10-CM | POA: Diagnosis not present

## 2019-07-19 DIAGNOSIS — I5032 Chronic diastolic (congestive) heart failure: Secondary | ICD-10-CM | POA: Diagnosis not present

## 2019-07-19 DIAGNOSIS — S80822D Blister (nonthermal), left lower leg, subsequent encounter: Secondary | ICD-10-CM | POA: Diagnosis not present

## 2019-07-19 DIAGNOSIS — S80821D Blister (nonthermal), right lower leg, subsequent encounter: Secondary | ICD-10-CM | POA: Diagnosis not present

## 2019-07-19 DIAGNOSIS — L89153 Pressure ulcer of sacral region, stage 3: Secondary | ICD-10-CM | POA: Diagnosis not present

## 2019-07-20 ENCOUNTER — Other Ambulatory Visit: Payer: Self-pay | Admitting: Adult Health

## 2019-07-20 DIAGNOSIS — I5032 Chronic diastolic (congestive) heart failure: Secondary | ICD-10-CM | POA: Diagnosis not present

## 2019-07-20 DIAGNOSIS — S80821D Blister (nonthermal), right lower leg, subsequent encounter: Secondary | ICD-10-CM | POA: Diagnosis not present

## 2019-07-20 DIAGNOSIS — S80822D Blister (nonthermal), left lower leg, subsequent encounter: Secondary | ICD-10-CM | POA: Diagnosis not present

## 2019-07-20 DIAGNOSIS — L89153 Pressure ulcer of sacral region, stage 3: Secondary | ICD-10-CM | POA: Diagnosis not present

## 2019-07-20 DIAGNOSIS — S72001D Fracture of unspecified part of neck of right femur, subsequent encounter for closed fracture with routine healing: Secondary | ICD-10-CM | POA: Diagnosis not present

## 2019-07-20 DIAGNOSIS — I110 Hypertensive heart disease with heart failure: Secondary | ICD-10-CM | POA: Diagnosis not present

## 2019-07-23 DIAGNOSIS — L89153 Pressure ulcer of sacral region, stage 3: Secondary | ICD-10-CM | POA: Diagnosis not present

## 2019-07-23 DIAGNOSIS — S72001D Fracture of unspecified part of neck of right femur, subsequent encounter for closed fracture with routine healing: Secondary | ICD-10-CM | POA: Diagnosis not present

## 2019-07-23 DIAGNOSIS — S80821D Blister (nonthermal), right lower leg, subsequent encounter: Secondary | ICD-10-CM | POA: Diagnosis not present

## 2019-07-23 DIAGNOSIS — I110 Hypertensive heart disease with heart failure: Secondary | ICD-10-CM | POA: Diagnosis not present

## 2019-07-23 DIAGNOSIS — S80822D Blister (nonthermal), left lower leg, subsequent encounter: Secondary | ICD-10-CM | POA: Diagnosis not present

## 2019-07-23 DIAGNOSIS — I5032 Chronic diastolic (congestive) heart failure: Secondary | ICD-10-CM | POA: Diagnosis not present

## 2019-07-24 DIAGNOSIS — S80822D Blister (nonthermal), left lower leg, subsequent encounter: Secondary | ICD-10-CM | POA: Diagnosis not present

## 2019-07-24 DIAGNOSIS — I5032 Chronic diastolic (congestive) heart failure: Secondary | ICD-10-CM | POA: Diagnosis not present

## 2019-07-24 DIAGNOSIS — L89153 Pressure ulcer of sacral region, stage 3: Secondary | ICD-10-CM | POA: Diagnosis not present

## 2019-07-24 DIAGNOSIS — I110 Hypertensive heart disease with heart failure: Secondary | ICD-10-CM | POA: Diagnosis not present

## 2019-07-24 DIAGNOSIS — S80821D Blister (nonthermal), right lower leg, subsequent encounter: Secondary | ICD-10-CM | POA: Diagnosis not present

## 2019-07-24 DIAGNOSIS — S72001D Fracture of unspecified part of neck of right femur, subsequent encounter for closed fracture with routine healing: Secondary | ICD-10-CM | POA: Diagnosis not present

## 2019-07-25 DIAGNOSIS — S72001D Fracture of unspecified part of neck of right femur, subsequent encounter for closed fracture with routine healing: Secondary | ICD-10-CM | POA: Diagnosis not present

## 2019-07-25 DIAGNOSIS — S80822D Blister (nonthermal), left lower leg, subsequent encounter: Secondary | ICD-10-CM | POA: Diagnosis not present

## 2019-07-25 DIAGNOSIS — S80821D Blister (nonthermal), right lower leg, subsequent encounter: Secondary | ICD-10-CM | POA: Diagnosis not present

## 2019-07-25 DIAGNOSIS — Z20828 Contact with and (suspected) exposure to other viral communicable diseases: Secondary | ICD-10-CM | POA: Diagnosis not present

## 2019-07-25 DIAGNOSIS — L89153 Pressure ulcer of sacral region, stage 3: Secondary | ICD-10-CM | POA: Diagnosis not present

## 2019-07-25 DIAGNOSIS — I110 Hypertensive heart disease with heart failure: Secondary | ICD-10-CM | POA: Diagnosis not present

## 2019-07-25 DIAGNOSIS — Z1159 Encounter for screening for other viral diseases: Secondary | ICD-10-CM | POA: Diagnosis not present

## 2019-07-25 DIAGNOSIS — I5032 Chronic diastolic (congestive) heart failure: Secondary | ICD-10-CM | POA: Diagnosis not present

## 2019-07-26 DIAGNOSIS — Z79899 Other long term (current) drug therapy: Secondary | ICD-10-CM | POA: Diagnosis not present

## 2019-07-27 DIAGNOSIS — S80822D Blister (nonthermal), left lower leg, subsequent encounter: Secondary | ICD-10-CM | POA: Diagnosis not present

## 2019-07-27 DIAGNOSIS — L89153 Pressure ulcer of sacral region, stage 3: Secondary | ICD-10-CM | POA: Diagnosis not present

## 2019-07-27 DIAGNOSIS — I5032 Chronic diastolic (congestive) heart failure: Secondary | ICD-10-CM | POA: Diagnosis not present

## 2019-07-27 DIAGNOSIS — I110 Hypertensive heart disease with heart failure: Secondary | ICD-10-CM | POA: Diagnosis not present

## 2019-07-27 DIAGNOSIS — S80821D Blister (nonthermal), right lower leg, subsequent encounter: Secondary | ICD-10-CM | POA: Diagnosis not present

## 2019-07-27 DIAGNOSIS — S72001D Fracture of unspecified part of neck of right femur, subsequent encounter for closed fracture with routine healing: Secondary | ICD-10-CM | POA: Diagnosis not present

## 2019-07-30 DIAGNOSIS — I5032 Chronic diastolic (congestive) heart failure: Secondary | ICD-10-CM | POA: Diagnosis not present

## 2019-07-30 DIAGNOSIS — S72001D Fracture of unspecified part of neck of right femur, subsequent encounter for closed fracture with routine healing: Secondary | ICD-10-CM | POA: Diagnosis not present

## 2019-07-30 DIAGNOSIS — I110 Hypertensive heart disease with heart failure: Secondary | ICD-10-CM | POA: Diagnosis not present

## 2019-07-30 DIAGNOSIS — L89153 Pressure ulcer of sacral region, stage 3: Secondary | ICD-10-CM | POA: Diagnosis not present

## 2019-07-30 DIAGNOSIS — S80822D Blister (nonthermal), left lower leg, subsequent encounter: Secondary | ICD-10-CM | POA: Diagnosis not present

## 2019-07-30 DIAGNOSIS — S80821D Blister (nonthermal), right lower leg, subsequent encounter: Secondary | ICD-10-CM | POA: Diagnosis not present

## 2019-08-01 DIAGNOSIS — Z1159 Encounter for screening for other viral diseases: Secondary | ICD-10-CM | POA: Diagnosis not present

## 2019-08-01 DIAGNOSIS — Z20828 Contact with and (suspected) exposure to other viral communicable diseases: Secondary | ICD-10-CM | POA: Diagnosis not present

## 2019-08-02 DIAGNOSIS — L89153 Pressure ulcer of sacral region, stage 3: Secondary | ICD-10-CM | POA: Diagnosis not present

## 2019-08-02 DIAGNOSIS — S80822D Blister (nonthermal), left lower leg, subsequent encounter: Secondary | ICD-10-CM | POA: Diagnosis not present

## 2019-08-02 DIAGNOSIS — S80821D Blister (nonthermal), right lower leg, subsequent encounter: Secondary | ICD-10-CM | POA: Diagnosis not present

## 2019-08-02 DIAGNOSIS — I110 Hypertensive heart disease with heart failure: Secondary | ICD-10-CM | POA: Diagnosis not present

## 2019-08-02 DIAGNOSIS — S72001D Fracture of unspecified part of neck of right femur, subsequent encounter for closed fracture with routine healing: Secondary | ICD-10-CM | POA: Diagnosis not present

## 2019-08-02 DIAGNOSIS — I5032 Chronic diastolic (congestive) heart failure: Secondary | ICD-10-CM | POA: Diagnosis not present

## 2019-08-03 DIAGNOSIS — L89153 Pressure ulcer of sacral region, stage 3: Secondary | ICD-10-CM | POA: Diagnosis not present

## 2019-08-03 DIAGNOSIS — S72001D Fracture of unspecified part of neck of right femur, subsequent encounter for closed fracture with routine healing: Secondary | ICD-10-CM | POA: Diagnosis not present

## 2019-08-03 DIAGNOSIS — S80822D Blister (nonthermal), left lower leg, subsequent encounter: Secondary | ICD-10-CM | POA: Diagnosis not present

## 2019-08-03 DIAGNOSIS — S80821D Blister (nonthermal), right lower leg, subsequent encounter: Secondary | ICD-10-CM | POA: Diagnosis not present

## 2019-08-03 DIAGNOSIS — I5032 Chronic diastolic (congestive) heart failure: Secondary | ICD-10-CM | POA: Diagnosis not present

## 2019-08-03 DIAGNOSIS — I110 Hypertensive heart disease with heart failure: Secondary | ICD-10-CM | POA: Diagnosis not present

## 2019-08-06 DIAGNOSIS — S80822D Blister (nonthermal), left lower leg, subsequent encounter: Secondary | ICD-10-CM | POA: Diagnosis not present

## 2019-08-06 DIAGNOSIS — S80821D Blister (nonthermal), right lower leg, subsequent encounter: Secondary | ICD-10-CM | POA: Diagnosis not present

## 2019-08-06 DIAGNOSIS — L89153 Pressure ulcer of sacral region, stage 3: Secondary | ICD-10-CM | POA: Diagnosis not present

## 2019-08-06 DIAGNOSIS — S72001D Fracture of unspecified part of neck of right femur, subsequent encounter for closed fracture with routine healing: Secondary | ICD-10-CM | POA: Diagnosis not present

## 2019-08-06 DIAGNOSIS — I110 Hypertensive heart disease with heart failure: Secondary | ICD-10-CM | POA: Diagnosis not present

## 2019-08-06 DIAGNOSIS — I5032 Chronic diastolic (congestive) heart failure: Secondary | ICD-10-CM | POA: Diagnosis not present

## 2019-08-08 DIAGNOSIS — Z20828 Contact with and (suspected) exposure to other viral communicable diseases: Secondary | ICD-10-CM | POA: Diagnosis not present

## 2019-08-08 DIAGNOSIS — Z1159 Encounter for screening for other viral diseases: Secondary | ICD-10-CM | POA: Diagnosis not present

## 2019-08-09 DIAGNOSIS — I5032 Chronic diastolic (congestive) heart failure: Secondary | ICD-10-CM | POA: Diagnosis not present

## 2019-08-09 DIAGNOSIS — I110 Hypertensive heart disease with heart failure: Secondary | ICD-10-CM | POA: Diagnosis not present

## 2019-08-09 DIAGNOSIS — L89153 Pressure ulcer of sacral region, stage 3: Secondary | ICD-10-CM | POA: Diagnosis not present

## 2019-08-09 DIAGNOSIS — S80822D Blister (nonthermal), left lower leg, subsequent encounter: Secondary | ICD-10-CM | POA: Diagnosis not present

## 2019-08-09 DIAGNOSIS — S80821D Blister (nonthermal), right lower leg, subsequent encounter: Secondary | ICD-10-CM | POA: Diagnosis not present

## 2019-08-09 DIAGNOSIS — S72001D Fracture of unspecified part of neck of right femur, subsequent encounter for closed fracture with routine healing: Secondary | ICD-10-CM | POA: Diagnosis not present

## 2019-08-10 ENCOUNTER — Telehealth: Payer: Self-pay | Admitting: Adult Health

## 2019-08-10 NOTE — Telephone Encounter (Signed)
Melissa from [facility] would like to advise that pt missed appt today due to having visits. She would also like verbal orders for occupational therapy recertification.  [id number]

## 2019-08-10 NOTE — Telephone Encounter (Signed)
Verbal orders given  

## 2019-08-10 NOTE — Telephone Encounter (Signed)
Spoke to [place] and informed her to proceed with orders.  Nothing further needed.

## 2019-08-13 DIAGNOSIS — S72001D Fracture of unspecified part of neck of right femur, subsequent encounter for closed fracture with routine healing: Secondary | ICD-10-CM | POA: Diagnosis not present

## 2019-08-13 DIAGNOSIS — I110 Hypertensive heart disease with heart failure: Secondary | ICD-10-CM | POA: Diagnosis not present

## 2019-08-13 DIAGNOSIS — S80821D Blister (nonthermal), right lower leg, subsequent encounter: Secondary | ICD-10-CM | POA: Diagnosis not present

## 2019-08-13 DIAGNOSIS — L89153 Pressure ulcer of sacral region, stage 3: Secondary | ICD-10-CM | POA: Diagnosis not present

## 2019-08-13 DIAGNOSIS — I5032 Chronic diastolic (congestive) heart failure: Secondary | ICD-10-CM | POA: Diagnosis not present

## 2019-08-13 DIAGNOSIS — S80822D Blister (nonthermal), left lower leg, subsequent encounter: Secondary | ICD-10-CM | POA: Diagnosis not present

## 2019-08-14 ENCOUNTER — Telehealth: Payer: Self-pay | Admitting: Adult Health

## 2019-08-14 DIAGNOSIS — H43393 Other vitreous opacities, bilateral: Secondary | ICD-10-CM | POA: Diagnosis not present

## 2019-08-14 DIAGNOSIS — Z961 Presence of intraocular lens: Secondary | ICD-10-CM | POA: Diagnosis not present

## 2019-08-14 DIAGNOSIS — H1045 Other chronic allergic conjunctivitis: Secondary | ICD-10-CM | POA: Diagnosis not present

## 2019-08-14 DIAGNOSIS — H16143 Punctate keratitis, bilateral: Secondary | ICD-10-CM | POA: Diagnosis not present

## 2019-08-14 DIAGNOSIS — H43811 Vitreous degeneration, right eye: Secondary | ICD-10-CM | POA: Diagnosis not present

## 2019-08-14 DIAGNOSIS — H353132 Nonexudative age-related macular degeneration, bilateral, intermediate dry stage: Secondary | ICD-10-CM | POA: Diagnosis not present

## 2019-08-14 DIAGNOSIS — H16223 Keratoconjunctivitis sicca, not specified as Sjogren's, bilateral: Secondary | ICD-10-CM | POA: Diagnosis not present

## 2019-08-14 DIAGNOSIS — H532 Diplopia: Secondary | ICD-10-CM | POA: Diagnosis not present

## 2019-08-14 DIAGNOSIS — H5021 Vertical strabismus, right eye: Secondary | ICD-10-CM | POA: Diagnosis not present

## 2019-08-14 DIAGNOSIS — H5010 Unspecified exotropia: Secondary | ICD-10-CM | POA: Diagnosis not present

## 2019-08-14 DIAGNOSIS — H35373 Puckering of macula, bilateral: Secondary | ICD-10-CM | POA: Diagnosis not present

## 2019-08-14 NOTE — Telephone Encounter (Signed)
Edmon Crape from [facility] returned your call  Please return her call at [phone]

## 2019-08-14 NOTE — Telephone Encounter (Signed)
Melissa with [facility] is calling with an update. Pt refused occupation therapy yesterday and today he was at a doctors appointment. Melissa, would like a verbal for occupational therapy evaluation for next week. Thanks   [employer] [phone]

## 2019-08-14 NOTE — Telephone Encounter (Signed)
Left a message for a return call.  Voicemail only stating her name.  Unsure if a safe voicemail.  Also did not state the company name.

## 2019-08-14 NOTE — Telephone Encounter (Signed)
Melissa informed to proceed.  Will forward to [facility] as [place].

## 2019-08-15 DIAGNOSIS — Z96641 Presence of right artificial hip joint: Secondary | ICD-10-CM | POA: Diagnosis not present

## 2019-08-15 DIAGNOSIS — M549 Dorsalgia, unspecified: Secondary | ICD-10-CM | POA: Diagnosis not present

## 2019-08-15 DIAGNOSIS — E785 Hyperlipidemia, unspecified: Secondary | ICD-10-CM | POA: Diagnosis not present

## 2019-08-15 DIAGNOSIS — F0390 Unspecified dementia without behavioral disturbance: Secondary | ICD-10-CM | POA: Diagnosis not present

## 2019-08-15 DIAGNOSIS — Z9181 History of falling: Secondary | ICD-10-CM | POA: Diagnosis not present

## 2019-08-15 DIAGNOSIS — Z1159 Encounter for screening for other viral diseases: Secondary | ICD-10-CM | POA: Diagnosis not present

## 2019-08-15 DIAGNOSIS — I110 Hypertensive heart disease with heart failure: Secondary | ICD-10-CM | POA: Diagnosis not present

## 2019-08-15 DIAGNOSIS — I2510 Atherosclerotic heart disease of native coronary artery without angina pectoris: Secondary | ICD-10-CM | POA: Diagnosis not present

## 2019-08-15 DIAGNOSIS — G8929 Other chronic pain: Secondary | ICD-10-CM | POA: Diagnosis not present

## 2019-08-15 DIAGNOSIS — I5032 Chronic diastolic (congestive) heart failure: Secondary | ICD-10-CM | POA: Diagnosis not present

## 2019-08-15 DIAGNOSIS — I272 Pulmonary hypertension, unspecified: Secondary | ICD-10-CM | POA: Diagnosis not present

## 2019-08-15 DIAGNOSIS — Z7901 Long term (current) use of anticoagulants: Secondary | ICD-10-CM | POA: Diagnosis not present

## 2019-08-15 DIAGNOSIS — S80821D Blister (nonthermal), right lower leg, subsequent encounter: Secondary | ICD-10-CM | POA: Diagnosis not present

## 2019-08-15 DIAGNOSIS — Z8546 Personal history of malignant neoplasm of prostate: Secondary | ICD-10-CM | POA: Diagnosis not present

## 2019-08-15 DIAGNOSIS — Z20828 Contact with and (suspected) exposure to other viral communicable diseases: Secondary | ICD-10-CM | POA: Diagnosis not present

## 2019-08-15 DIAGNOSIS — S72001D Fracture of unspecified part of neck of right femur, subsequent encounter for closed fracture with routine healing: Secondary | ICD-10-CM | POA: Diagnosis not present

## 2019-08-15 DIAGNOSIS — S80822D Blister (nonthermal), left lower leg, subsequent encounter: Secondary | ICD-10-CM | POA: Diagnosis not present

## 2019-08-16 DIAGNOSIS — I110 Hypertensive heart disease with heart failure: Secondary | ICD-10-CM | POA: Diagnosis not present

## 2019-08-16 DIAGNOSIS — I2510 Atherosclerotic heart disease of native coronary artery without angina pectoris: Secondary | ICD-10-CM | POA: Diagnosis not present

## 2019-08-16 DIAGNOSIS — I5032 Chronic diastolic (congestive) heart failure: Secondary | ICD-10-CM | POA: Diagnosis not present

## 2019-08-16 DIAGNOSIS — S72001D Fracture of unspecified part of neck of right femur, subsequent encounter for closed fracture with routine healing: Secondary | ICD-10-CM | POA: Diagnosis not present

## 2019-08-16 DIAGNOSIS — S80822D Blister (nonthermal), left lower leg, subsequent encounter: Secondary | ICD-10-CM | POA: Diagnosis not present

## 2019-08-16 DIAGNOSIS — S80821D Blister (nonthermal), right lower leg, subsequent encounter: Secondary | ICD-10-CM | POA: Diagnosis not present

## 2019-08-20 DIAGNOSIS — S80822D Blister (nonthermal), left lower leg, subsequent encounter: Secondary | ICD-10-CM | POA: Diagnosis not present

## 2019-08-20 DIAGNOSIS — Z8551 Personal history of malignant neoplasm of bladder: Secondary | ICD-10-CM | POA: Diagnosis not present

## 2019-08-20 DIAGNOSIS — I5032 Chronic diastolic (congestive) heart failure: Secondary | ICD-10-CM | POA: Diagnosis not present

## 2019-08-20 DIAGNOSIS — S72001D Fracture of unspecified part of neck of right femur, subsequent encounter for closed fracture with routine healing: Secondary | ICD-10-CM | POA: Diagnosis not present

## 2019-08-20 DIAGNOSIS — N3946 Mixed incontinence: Secondary | ICD-10-CM | POA: Diagnosis not present

## 2019-08-20 DIAGNOSIS — I110 Hypertensive heart disease with heart failure: Secondary | ICD-10-CM | POA: Diagnosis not present

## 2019-08-20 DIAGNOSIS — S80821D Blister (nonthermal), right lower leg, subsequent encounter: Secondary | ICD-10-CM | POA: Diagnosis not present

## 2019-08-20 DIAGNOSIS — Z8546 Personal history of malignant neoplasm of prostate: Secondary | ICD-10-CM | POA: Diagnosis not present

## 2019-08-20 DIAGNOSIS — I2510 Atherosclerotic heart disease of native coronary artery without angina pectoris: Secondary | ICD-10-CM | POA: Diagnosis not present

## 2019-08-22 ENCOUNTER — Telehealth: Payer: Self-pay | Admitting: Adult Health

## 2019-08-22 DIAGNOSIS — Z1159 Encounter for screening for other viral diseases: Secondary | ICD-10-CM | POA: Diagnosis not present

## 2019-08-22 DIAGNOSIS — Z20828 Contact with and (suspected) exposure to other viral communicable diseases: Secondary | ICD-10-CM | POA: Diagnosis not present

## 2019-08-22 NOTE — Telephone Encounter (Signed)
Had orders for Occupational Therapy and the patient is refusing at this time.  [place]

## 2019-08-23 DIAGNOSIS — S80821D Blister (nonthermal), right lower leg, subsequent encounter: Secondary | ICD-10-CM | POA: Diagnosis not present

## 2019-08-23 DIAGNOSIS — S80822D Blister (nonthermal), left lower leg, subsequent encounter: Secondary | ICD-10-CM | POA: Diagnosis not present

## 2019-08-23 DIAGNOSIS — I5032 Chronic diastolic (congestive) heart failure: Secondary | ICD-10-CM | POA: Diagnosis not present

## 2019-08-23 DIAGNOSIS — S72001D Fracture of unspecified part of neck of right femur, subsequent encounter for closed fracture with routine healing: Secondary | ICD-10-CM | POA: Diagnosis not present

## 2019-08-23 DIAGNOSIS — I110 Hypertensive heart disease with heart failure: Secondary | ICD-10-CM | POA: Diagnosis not present

## 2019-08-23 DIAGNOSIS — I2510 Atherosclerotic heart disease of native coronary artery without angina pectoris: Secondary | ICD-10-CM | POA: Diagnosis not present

## 2019-08-24 ENCOUNTER — Ambulatory Visit: Payer: Medicare Other | Admitting: Cardiovascular Disease

## 2019-08-27 ENCOUNTER — Ambulatory Visit: Payer: Medicare Other | Admitting: Neurology

## 2019-08-27 DIAGNOSIS — I5032 Chronic diastolic (congestive) heart failure: Secondary | ICD-10-CM | POA: Diagnosis not present

## 2019-08-27 DIAGNOSIS — S80821D Blister (nonthermal), right lower leg, subsequent encounter: Secondary | ICD-10-CM | POA: Diagnosis not present

## 2019-08-27 DIAGNOSIS — S80822D Blister (nonthermal), left lower leg, subsequent encounter: Secondary | ICD-10-CM | POA: Diagnosis not present

## 2019-08-27 DIAGNOSIS — I110 Hypertensive heart disease with heart failure: Secondary | ICD-10-CM | POA: Diagnosis not present

## 2019-08-27 DIAGNOSIS — I2510 Atherosclerotic heart disease of native coronary artery without angina pectoris: Secondary | ICD-10-CM | POA: Diagnosis not present

## 2019-08-27 DIAGNOSIS — S72001D Fracture of unspecified part of neck of right femur, subsequent encounter for closed fracture with routine healing: Secondary | ICD-10-CM | POA: Diagnosis not present

## 2019-08-28 ENCOUNTER — Non-Acute Institutional Stay: Payer: Medicare Other

## 2019-08-28 DIAGNOSIS — Z515 Encounter for palliative care: Secondary | ICD-10-CM

## 2019-08-29 ENCOUNTER — Other Ambulatory Visit: Payer: Self-pay

## 2019-08-29 DIAGNOSIS — Z1159 Encounter for screening for other viral diseases: Secondary | ICD-10-CM | POA: Diagnosis not present

## 2019-08-29 DIAGNOSIS — Z20828 Contact with and (suspected) exposure to other viral communicable diseases: Secondary | ICD-10-CM | POA: Diagnosis not present

## 2019-08-30 DIAGNOSIS — I110 Hypertensive heart disease with heart failure: Secondary | ICD-10-CM | POA: Diagnosis not present

## 2019-08-30 DIAGNOSIS — S72001D Fracture of unspecified part of neck of right femur, subsequent encounter for closed fracture with routine healing: Secondary | ICD-10-CM | POA: Diagnosis not present

## 2019-08-30 DIAGNOSIS — I5032 Chronic diastolic (congestive) heart failure: Secondary | ICD-10-CM | POA: Diagnosis not present

## 2019-08-30 DIAGNOSIS — S80821D Blister (nonthermal), right lower leg, subsequent encounter: Secondary | ICD-10-CM | POA: Diagnosis not present

## 2019-08-30 DIAGNOSIS — I2510 Atherosclerotic heart disease of native coronary artery without angina pectoris: Secondary | ICD-10-CM | POA: Diagnosis not present

## 2019-08-30 DIAGNOSIS — S80822D Blister (nonthermal), left lower leg, subsequent encounter: Secondary | ICD-10-CM | POA: Diagnosis not present

## 2019-08-30 NOTE — Progress Notes (Signed)
COMMUNITY PALLIATIVE CARE SW NOTE  PATIENT NAME: Jeremy Kelley DOB: 1927/03/24 MRN: [id number]  PRIMARY CARE PROVIDER: [name], NP  RESPONSIBLE PARTY:  Acct ID - Guarantor Home Phone Work Phone Relationship Acct Type  [id number] Clyda [phone] Self P/F     [address], [address], [place], [place] [address]     PLAN OF CARE and INTERVENTIONS:             1. GOALS OF CARE/ ADVANCE CARE PLANNING: Goal is for patient to remain in the facility as independent as possible. Patient is a FULL CODE 2. SOCIAL/EMOTIONAL/SPIRITUAL ASSESSMENT/ INTERVENTIONS:  SW completed a face-to-face visit with patient at the facility [facility]). Patient was sitting and drinking coffee. Patient appeared anxious as he was concern about plumbing issues. SW introduced herself, explained palliative care program to him and visit frequency. He was verbalized understanding and advised SW that he did not have time to talk because he was waiting on maintenance. SW assessed if he was having any pain or discomfort and he stated no. He verbalized no immediate needs.SW consulted with the med tech-Stella and LPN-Imane who reported that patient ambulated independently with his walker. He takes his medication with applesauce. Patient has blisters to his legs that are being treated. 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 Patient is a [place] [employer]. He has a POA-Win Alexander. SW provided introductions, supportive presence, assessed needs and comfort, consult and chart review.  3. PATIENT/CAREGIVER EDUCATION/ COPING:  Patient cope well by maintaining independence. SW left a message for [employer] updating her visit and extending support to her.  4. PERSONAL EMERGENCY PLAN: Per facility protocol. 5. COMMUNITY RESOURCES COORDINATION/ HEALTH CARE NAVIGATION: Patient receives home health services for wound care for wound care. He has access to  activities at the facility for social stimulation. 6. FINANCIAL/LEGAL CONCERNS/INTERVENTIONS: No legal or financial issues.     SOCIAL HX:  Social History   Tobacco Use  . Smoking status: Never Smoker  . Smokeless tobacco: Never Used  . Tobacco comment: 07/29/2015 "might smoke a cigar once/year"  Substance Use Topics  . Alcohol use: Yes    Alcohol/week: 14.0 standard drinks    Types: 14 Glasses of wine per week    Comment: 07/29/2015 "big glass of wine q night"    CODE STATUS:   Code Status: Prior FULL CODE ADVANCED DIRECTIVES: N MOST FORM COMPLETE:  No HOSPICE EDUCATION PROVIDED: No  PPS: Patient ambulates independently with a walker. He is alert and oriented x3.  Duration of visit and documentation: 50 minutes      Katheren Puller, LCSW

## 2019-09-03 DIAGNOSIS — I110 Hypertensive heart disease with heart failure: Secondary | ICD-10-CM | POA: Diagnosis not present

## 2019-09-03 DIAGNOSIS — I2510 Atherosclerotic heart disease of native coronary artery without angina pectoris: Secondary | ICD-10-CM | POA: Diagnosis not present

## 2019-09-03 DIAGNOSIS — I5032 Chronic diastolic (congestive) heart failure: Secondary | ICD-10-CM | POA: Diagnosis not present

## 2019-09-03 DIAGNOSIS — S72001D Fracture of unspecified part of neck of right femur, subsequent encounter for closed fracture with routine healing: Secondary | ICD-10-CM | POA: Diagnosis not present

## 2019-09-03 DIAGNOSIS — S80821D Blister (nonthermal), right lower leg, subsequent encounter: Secondary | ICD-10-CM | POA: Diagnosis not present

## 2019-09-03 DIAGNOSIS — S80822D Blister (nonthermal), left lower leg, subsequent encounter: Secondary | ICD-10-CM | POA: Diagnosis not present

## 2019-09-05 DIAGNOSIS — Z20828 Contact with and (suspected) exposure to other viral communicable diseases: Secondary | ICD-10-CM | POA: Diagnosis not present

## 2019-09-05 DIAGNOSIS — K529 Noninfective gastroenteritis and colitis, unspecified: Secondary | ICD-10-CM | POA: Diagnosis not present

## 2019-09-05 DIAGNOSIS — Z1159 Encounter for screening for other viral diseases: Secondary | ICD-10-CM | POA: Diagnosis not present

## 2019-09-06 ENCOUNTER — Other Ambulatory Visit: Payer: Self-pay

## 2019-09-06 ENCOUNTER — Encounter: Payer: Self-pay | Admitting: Cardiovascular Disease

## 2019-09-06 ENCOUNTER — Ambulatory Visit (INDEPENDENT_AMBULATORY_CARE_PROVIDER_SITE_OTHER): Payer: Medicare Other | Admitting: Cardiovascular Disease

## 2019-09-06 VITALS — BP 118/52 | HR 62 | Ht 69.0 in | Wt 121.5 lb

## 2019-09-06 DIAGNOSIS — S80821D Blister (nonthermal), right lower leg, subsequent encounter: Secondary | ICD-10-CM | POA: Diagnosis not present

## 2019-09-06 DIAGNOSIS — S72001D Fracture of unspecified part of neck of right femur, subsequent encounter for closed fracture with routine healing: Secondary | ICD-10-CM | POA: Diagnosis not present

## 2019-09-06 DIAGNOSIS — I272 Pulmonary hypertension, unspecified: Secondary | ICD-10-CM | POA: Diagnosis not present

## 2019-09-06 DIAGNOSIS — I5032 Chronic diastolic (congestive) heart failure: Secondary | ICD-10-CM | POA: Diagnosis not present

## 2019-09-06 DIAGNOSIS — I351 Nonrheumatic aortic (valve) insufficiency: Secondary | ICD-10-CM

## 2019-09-06 DIAGNOSIS — I2510 Atherosclerotic heart disease of native coronary artery without angina pectoris: Secondary | ICD-10-CM | POA: Diagnosis not present

## 2019-09-06 DIAGNOSIS — I25119 Atherosclerotic heart disease of native coronary artery with unspecified angina pectoris: Secondary | ICD-10-CM | POA: Diagnosis not present

## 2019-09-06 DIAGNOSIS — I110 Hypertensive heart disease with heart failure: Secondary | ICD-10-CM | POA: Diagnosis not present

## 2019-09-06 DIAGNOSIS — S80822D Blister (nonthermal), left lower leg, subsequent encounter: Secondary | ICD-10-CM | POA: Diagnosis not present
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
## 2019-09-06 NOTE — Patient Instructions (Signed)
Medication Instructions:  Your physician recommends that you continue on your current medications as directed. Please refer to the Current Medication list given to you today.  *If you need a refill on your cardiac medications before your next appointment, please call your pharmacy*   Lab Work: None Ordered If you have labs (blood work) drawn today and your tests are completely normal, you will receive your results only by: [name] MyChart Message (if you have MyChart) OR . A paper copy in the mail If you have any lab test that is abnormal or we need to change your treatment, we will call you to review the results.   Testing/Procedures: Your physician has requested that you have an echocardiogram. Echocardiography is a painless test that uses sound waves to create images of your heart. It provides your doctor with information about the size and shape of your heart and how well your heart's chambers and valves are working. This procedure takes approximately one hour. There are no restrictions for this procedure.    Follow-Up: At [facility], you and your health needs are our priority.  As part of our continuing mission to provide you with exceptional heart care, we have created designated Provider Care Teams.  These Care Teams include your primary Cardiologist (physician) and Advanced Practice Providers (APPs -  Physician Assistants and Nurse Practitioners) who all work together to provide you with the care you need, when you need it.  We recommend signing up for the patient portal called "MyChart".  Sign up information is provided on this After Visit Summary.  MyChart is used to connect with patients for Virtual Visits (Telemedicine).  Patients are able to view lab/test results, encounter notes, upcoming appointments, etc.  Non-urgent messages can be sent to your provider as well.   To learn more about what you can do with MyChart, go to [email or web address].    Your next appointment:   6  month(s)  The format for your next appointment:   Either In Person or Virtual  Provider:   You may see Mertie Moores, MD or one of the following Advanced Practice Providers on your designated Care Team:    Richardson Dopp, PA-C  [place], [place]

## 2019-09-11 ENCOUNTER — Other Ambulatory Visit: Payer: Self-pay

## 2019-09-11 ENCOUNTER — Non-Acute Institutional Stay: Payer: Medicare Other

## 2019-09-11 DIAGNOSIS — Z515 Encounter for palliative care: Secondary | ICD-10-CM

## 2019-09-11 DIAGNOSIS — S80821D Blister (nonthermal), right lower leg, subsequent encounter: Secondary | ICD-10-CM | POA: Diagnosis not present

## 2019-09-11 DIAGNOSIS — S72001D Fracture of unspecified part of neck of right femur, subsequent encounter for closed fracture with routine healing: Secondary | ICD-10-CM | POA: Diagnosis not present

## 2019-09-11 DIAGNOSIS — S80822D Blister (nonthermal), left lower leg, subsequent encounter: Secondary | ICD-10-CM | POA: Diagnosis not present

## 2019-09-11 DIAGNOSIS — I5032 Chronic diastolic (congestive) heart failure: Secondary | ICD-10-CM | POA: Diagnosis not present

## 2019-09-11 DIAGNOSIS — I110 Hypertensive heart disease with heart failure: Secondary | ICD-10-CM | POA: Diagnosis not present

## 2019-09-11 DIAGNOSIS — I2510 Atherosclerotic heart disease of native coronary artery without angina pectoris: Secondary | ICD-10-CM | POA: Diagnosis not present

## 2019-09-12 DIAGNOSIS — Z1159 Encounter for screening for other viral diseases: Secondary | ICD-10-CM | POA: Diagnosis not present

## 2019-09-12 DIAGNOSIS — Z20828 Contact with and (suspected) exposure to other viral communicable diseases: Secondary | ICD-10-CM | POA: Diagnosis not present

## 2019-09-13 DIAGNOSIS — I110 Hypertensive heart disease with heart failure: Secondary | ICD-10-CM | POA: Diagnosis not present

## 2019-09-13 DIAGNOSIS — S72001D Fracture of unspecified part of neck of right femur, subsequent encounter for closed fracture with routine healing: Secondary | ICD-10-CM | POA: Diagnosis not present

## 2019-09-13 DIAGNOSIS — I2510 Atherosclerotic heart disease of native coronary artery without angina pectoris: Secondary | ICD-10-CM | POA: Diagnosis not present

## 2019-09-13 DIAGNOSIS — S80821D Blister (nonthermal), right lower leg, subsequent encounter: Secondary | ICD-10-CM | POA: Diagnosis not present

## 2019-09-13 DIAGNOSIS — I5032 Chronic diastolic (congestive) heart failure: Secondary | ICD-10-CM | POA: Diagnosis not present

## 2019-09-13 DIAGNOSIS — S80822D Blister (nonthermal), left lower leg, subsequent encounter: Secondary | ICD-10-CM | POA: Diagnosis not present

## 2019-09-13 NOTE — Progress Notes (Signed)
COMMUNITY PALLIATIVE CARE SW NOTE  PATIENT NAME: Jeremy Kelley DOB: [birth year] MRN: [id number]  PRIMARY CARE PROVIDER: [name], NP  RESPONSIBLE PARTY:  Acct ID - Guarantor Home Phone Work Phone Relationship Acct Type  [id number] Clyda [phone] Self P/F     [address], [address], [place],  [id number]     PLAN OF CARE and INTERVENTIONS:             1. GOALS OF CARE/ ADVANCE CARE PLANNING:  Goal is for patient to remain in the facility as independent as possible. Patient is a FULL CODE 2. SOCIAL/EMOTIONAL/SPIRITUAL ASSESSMENT/ INTERVENTIONS:  SW completed a face-to-face visit with patient at the facility [facility]). Patient was in bed sleeping. He did not arouse to verbal or tactile prompts. He did not appear to be in any pain or discomfort. SW consulted with the med tech-Stella who reported that patient continues to ambulate independently with his walker. He takes his medication with applesauce. Patient has blisters to his legs that are being treated by the wound care nurse. Patient remains independent with his personal care needs. SW provided observation, assessment of needs and comfort, consult with staff and reaffirmed availability of support through the hospice program.  3.  PATIENT/CAREGIVER EDUCATION/ COPING:  Patient seems to be coping well. He has a supportive POA and friends. 4. PERSONAL EMERGENCY PLAN:  Per facility protocol. 5. COMMUNITY RESOURCES COORDINATION/ HEALTH CARE NAVIGATION: Patient receives home health services for wound care for wound care. He has access to activities at the facility for social stimulation  6. FINANCIAL/LEGAL CONCERNS/INTERVENTIONS:  No financial or legal concerns.      SOCIAL HX:  Social History   Tobacco Use  . Smoking status: Never Smoker  . Smokeless tobacco: Never Used  . Tobacco comment: 07/29/2015 "might smoke a cigar once/year"  Substance Use Topics  . Alcohol use: Yes     Alcohol/week: 14.0 standard drinks    Types: 14 Glasses of wine per week    Comment: 07/29/2015 "big glass of wine q night"    CODE STATUS: FULL CODE ADVANCED DIRECTIVES: No MOST FORM COMPLETE:  No HOSPICE EDUCATION PROVIDED: No  PPS:  Patient ambulates independently with a walker. He is alert and oriented x3.  Duration of visit and documentation: 30 minutes     Katheren Puller, LCSW

## 2019-09-14 DIAGNOSIS — I110 Hypertensive heart disease with heart failure: Secondary | ICD-10-CM | POA: Diagnosis not present

## 2019-09-14 DIAGNOSIS — S80822D Blister (nonthermal), left lower leg, subsequent encounter: Secondary | ICD-10-CM | POA: Diagnosis not present

## 2019-09-14 DIAGNOSIS — Z8546 Personal history of malignant neoplasm of prostate: Secondary | ICD-10-CM | POA: Diagnosis not present

## 2019-09-14 DIAGNOSIS — I5032 Chronic diastolic (congestive) heart failure: Secondary | ICD-10-CM | POA: Diagnosis not present

## 2019-09-14 DIAGNOSIS — M549 Dorsalgia, unspecified: Secondary | ICD-10-CM | POA: Diagnosis not present

## 2019-09-14 DIAGNOSIS — S80821D Blister (nonthermal), right lower leg, subsequent encounter: Secondary | ICD-10-CM | POA: Diagnosis not present

## 2019-09-14 DIAGNOSIS — F0390 Unspecified dementia without behavioral disturbance: Secondary | ICD-10-CM | POA: Diagnosis not present

## 2019-09-14 DIAGNOSIS — Z7901 Long term (current) use of anticoagulants: Secondary | ICD-10-CM | POA: Diagnosis not present

## 2019-09-14 DIAGNOSIS — E785 Hyperlipidemia, unspecified: Secondary | ICD-10-CM | POA: Diagnosis not present

## 2019-09-14 DIAGNOSIS — I2510 Atherosclerotic heart disease of native coronary artery without angina pectoris: Secondary | ICD-10-CM | POA: Diagnosis not present

## 2019-09-14 DIAGNOSIS — Z9181 History of falling: Secondary | ICD-10-CM | POA: Diagnosis not present

## 2019-09-14 DIAGNOSIS — I272 Pulmonary hypertension, unspecified: Secondary | ICD-10-CM | POA: Diagnosis not present

## 2019-09-14 DIAGNOSIS — S72001D Fracture of unspecified part of neck of right femur, subsequent encounter for closed fracture with routine healing: Secondary | ICD-10-CM | POA: Diagnosis not present

## 2019-09-14 DIAGNOSIS — G8929 Other chronic pain: Secondary | ICD-10-CM | POA: Diagnosis not present

## 2019-09-14 DIAGNOSIS — Z96641 Presence of right artificial hip joint: Secondary | ICD-10-CM | POA: Diagnosis not present

## 2019-09-17 DIAGNOSIS — I2510 Atherosclerotic heart disease of native coronary artery without angina pectoris: Secondary | ICD-10-CM | POA: Diagnosis not present

## 2019-09-17 DIAGNOSIS — S72001D Fracture of unspecified part of neck of right femur, subsequent encounter for closed fracture with routine healing: Secondary | ICD-10-CM | POA: Diagnosis not present

## 2019-09-17 DIAGNOSIS — I110 Hypertensive heart disease with heart failure: Secondary | ICD-10-CM | POA: Diagnosis not present

## 2019-09-17 DIAGNOSIS — S80821D Blister (nonthermal), right lower leg, subsequent encounter: Secondary | ICD-10-CM | POA: Diagnosis not present

## 2019-09-17 DIAGNOSIS — I5032 Chronic diastolic (congestive) heart failure: Secondary | ICD-10-CM | POA: Diagnosis not present

## 2019-09-17 DIAGNOSIS — S80822D Blister (nonthermal), left lower leg, subsequent encounter: Secondary | ICD-10-CM | POA: Diagnosis not present

## 2019-09-18 DIAGNOSIS — A0472 Enterocolitis due to Clostridium difficile, not specified as recurrent: Secondary | ICD-10-CM | POA: Diagnosis not present

## 2019-09-18 DIAGNOSIS — R69 Illness, unspecified: Secondary | ICD-10-CM | POA: Diagnosis not present

## 2019-09-25 ENCOUNTER — Encounter: Payer: Self-pay | Admitting: Adult Health

## 2019-09-25 ENCOUNTER — Other Ambulatory Visit: Payer: Self-pay

## 2019-09-25 ENCOUNTER — Ambulatory Visit (INDEPENDENT_AMBULATORY_CARE_PROVIDER_SITE_OTHER): Payer: Medicare Other | Admitting: Adult Health

## 2019-09-25 VITALS — BP 130/82 | Temp 98.2°F | Wt 129.0 lb

## 2019-09-25 DIAGNOSIS — I25119 Atherosclerotic heart disease of native coronary artery with unspecified angina pectoris: Secondary | ICD-10-CM | POA: Diagnosis not present

## 2019-09-25 DIAGNOSIS — R195 Other fecal abnormalities: Secondary | ICD-10-CM | POA: Diagnosis not present

## 2019-09-25 DIAGNOSIS — R5382 Chronic fatigue, unspecified: Secondary | ICD-10-CM

## 2019-09-25 NOTE — Patient Instructions (Signed)
It was great seeing you today.   I am going to order some basic labs on you. You can go to the [facility] office for the labs. You do not need an appointment

## 2019-09-25 NOTE — Progress Notes (Signed)
Subjective:    Patient ID: Jeremy Kelley, male    DOB: [birth year], 86 y.o.   MRN: [id number]  HPI  86 year old male who  has a past medical history of Arthritis, Carotid artery stenosis (11/2008), Colon polyps, Diverticulosis, Hepatitis, Hyperlipidemia, Hypertension, Numbness, Phimosis, Prostate cancer ([place]) (2001), and Urothelial carcinoma of left distal ureter ([place]).   He is currently living at [facility].  He was seen by his cardiologist Dr. Katharina Caper a few weeks ago due to history of atrial fibrillation, aortic insufficiency, pulmonary hypertension, mitral regurgitation, CAD, generalized fatigue.  He has an echo scheduled in a couple weeks.  He reports overall he is doing okay his living facility.  His fatigue is a major issue which is chronic and it is a good been explained to him many times that nothing can be done for this.  He also reports that he had a stool test done at the facility which was negative but he continues to have soft stool and goes multiple times a day.    His appetite is good and he is able to gain about 8 pounds.  He continues to work with physical therapy at the facility and stays as active as he possibly can.  He would like to have some blood work done to "make sure everything is okay".    Review of Systems   See HPI   Past Medical History:  Diagnosis Date   Arthritis    Carotid artery stenosis 11/2008   bilateral 40-59% stenosis   Colon polyps    Diverticulosis    Hepatitis    doesn't know which type 1969   Hyperlipidemia    Hypertension    Numbness    fingertips   Phimosis    Prostate cancer ([place]) 2001   Urothelial carcinoma of left distal ureter ([place])     Social History   Socioeconomic History   Marital status: Single    Spouse name: Not on file   Number of children: 0   Years of education: Not on file   Highest education level: Not on file  Occupational History   Occupation: Retired  Tobacco Use   Smoking status:  Never Smoker   Smokeless tobacco: Never Used   Tobacco comment: 07/29/2015 "might smoke a cigar once/year"  Vaping Use   Vaping Use: Never used  Substance and Sexual Activity   Alcohol use: Yes    Alcohol/week: 14.0 standard drinks    Types: 14 Glasses of wine per week    Comment: 07/29/2015 "big glass of wine q night"   Drug use: No   Sexual activity: Not on file  Other Topics Concern   Not on file  Social History Narrative   Patient is single, has never been married.  Does not have any children.  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   He drinks approximately 3-4 glasses of wine per week.  No history of excessive alcohol use.  Occasionally smokes a cigar.   Spends winters in [place]        Social Determinants of Health   Financial Resource Strain:    Difficulty of Paying Living Expenses:   Food Insecurity:    Worried About Charity fundraiser in the Last Year:    Arboriculturist in the Last Year:   Transportation Needs:    Film/video editor (Medical):    Lack of Transportation (Non-Medical):   Physical Activity:    Days of Exercise  per Week:    Minutes of Exercise per Session:   Stress:    Feeling of Stress :   Social Connections:    Frequency of Communication with Friends and Family:    Frequency of Social Gatherings with Friends and Family:    Attends Religious Services:    Active Member of Clubs or Organizations:    Attends Archivist Meetings:    Marital Status:   Intimate Partner Violence:    Fear of Current or Ex-Partner:    Emotionally Abused:    Physically Abused:    Sexually Abused:     Past Surgical History:  Procedure Laterality Date   BIOPSY  10/01/2011   Procedure: BIOPSY;  Surgeon: [name], MD;  Location: [facility];  Service: Urology;;  biopsy of bladder tumor   BLADDER SUSPENSION  08/18/10   [name]   CARDIAC CATHETERIZATION N/A 07/29/2015   Procedure: Right/Left Heart Cath and  Coronary Angiography;  Surgeon: Peter M Martinique, MD;  Location: [place] [facility];  Service: Cardiovascular;  Laterality: N/A;   CATARACT EXTRACTION W/ INTRAOCULAR LENS  IMPLANT, BILATERAL Bilateral    CYSTOSCOPY W/ RETROGRADES  01/27/2012   Procedure: CYSTOSCOPY WITH RETROGRADE PYELOGRAM;  Surgeon: [name], MD;  [facility];  Service: Urology;  Laterality: Left;   CYSTOSCOPY WITH RETROGRADE PYELOGRAM, URETEROSCOPY AND STENT PLACEMENT Left 11/24/2012   Procedure: CYSTOSCOPY WITH left RETROGRADE PYELOGRAM, bladder washings, ureteroscopy;  Surgeon: [name], MD;  [facility];  Service: Urology;  Laterality: Left;   CYSTOSCOPY WITH RETROGRADE PYELOGRAM, URETEROSCOPY AND STENT PLACEMENT Bilateral 11/19/2013   Procedure: CYSTOSCOPY WITH BILATERAL RETROGRADE PYELOGRAM,;  Surgeon: [name], MD;  [facility];  Service: Urology;  Laterality: Bilateral;   EYE SURGERY  09/24/10   left eye. 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 MARGIN   SKIN CANCER EXCISION     a/p skin cancer right foot-non melanoma   skin laceration     TEE WITHOUT CARDIOVERSION N/A 07/29/2015   Procedure: TRANSESOPHAGEAL ECHOCARDIOGRAM (TEE);  Surgeon: Pixie Casino, MD;  [facility];  Service: Cardiovascular;  Laterality: N/A;  cath to follow   TOTAL HIP ARTHROPLASTY Right 05/03/2019   Procedure: RIGHT HEMI HIP ARTHROPLASTY ANTERIOR APPROACH;  Surgeon: Leandrew Koyanagi, MD;  [facility];  Service: Orthopedics;  Laterality: Right;  WIll Need Retractor Holder   TRANSURETHRAL RESECTION OF BLADDER TUMOR  10/01/2011   Procedure: TRANSURETHRAL RESECTION OF BLADDER TUMOR (TURBT);  Surgeon: [name], MD;  [facility];  Service: Urology;  Laterality: N/A;   TRANSURETHRAL RESECTION OF BLADDER TUMOR N/A 11/19/2013   Procedure: TRANSURETHRAL RESECTION OF BLADDER TUMOR (TURBT);  Surgeon: [name], MD;  [facility];  Service: Urology;  Laterality: N/A;   URETEROSCOPY  01/27/2012   Procedure: URETEROSCOPY;  Surgeon: [name], MD;  [facility];  Service: Urology;  Laterality: Left;  CYSTO, Left RETROGRADE PYELOGRPHY, LEFT URETEROSCOPY    URINARY SPHINCTER IMPLANT  VARICOSE VEIN SURGERY      Family History  Problem Relation Age of Onset   Pulmonary disease Father    Goiter Father    Colon cancer Neg Hx    Colon polyps Neg Hx     No Known Allergies  Current Outpatient Medications on File Prior to Visit  Medication Sig Dispense Refill   acetaminophen (TYLENOL) 325 MG tablet Take 1-2 tablets (325-650 mg total) by mouth every 6 (six) hours as needed for mild pain or moderate pain (pain score 1-3 or temp > 100.5). 90 tablet 1   apixaban (ELIQUIS) 2.5 MG TABS tablet Take 1 tablet (2.5 mg total) by mouth 2 (two) times daily. 60 tablet 0   b complex vitamins capsule Take 1 capsule by mouth daily.     cholecalciferol (VITAMIN D) 1000 units tablet Take 1 tablet (1,000 Units total) by mouth daily. 90 tablet 3   donepezil (ARICEPT) 5 MG tablet One tablet daily 30 tablet 0   fluticasone (FLONASE) 50 MCG/ACT nasal spray Place 2 sprays into both nostrils at bedtime. 9.9 mL 0   hypromellose (SYSTANE OVERNIGHT THERAPY) 0.3 % GEL ophthalmic ointment Place 1 application into both eyes at bedtime.     loperamide (IMODIUM) 2 MG capsule Take 2 mg by mouth 4 (four) times daily as needed for diarrhea or loose stools.     metoprolol succinate (TOPROL-XL) 25  MG 24 hr tablet TAKE 1 TABLET BY MOUTH EVERYDAY AT BEDTIME 90 tablet 1   mirabegron ER (MYRBETRIQ) 50 MG TB24 tablet Take 1 tablet (50 mg total) by mouth daily. 30 tablet 0   Olopatadine HCl 0.2 % SOLN Apply 1 drop to eye as directed. For itching     phosphorus (K PHOS NEUTRAL) 155-852-130 MG tablet Take 500 mg by mouth 2 (two) times daily.     potassium chloride (MICRO-K) 10 MEQ CR capsule Take 1 capsule (10 mEq total) by mouth daily. 30 capsule 0   Probiotic Product (FLORAJEN DIGESTION PO) Take 1 capsule by mouth daily.     Tacrolimus 0.1 % CREA Apply topically. Spread topically to bilateral legs once daily     torsemide (DEMADEX) 20 MG tablet Take 1 tablet (20 mg total) by mouth daily. 30 tablet 0   No current facility-administered medications on file prior to visit.    BP 130/82    Temp 98.2 F (36.8 C)    Wt 129 lb (58.5 kg)    BMI 19.05 kg/m    Past Medical History:  Diagnosis Date   Arthritis    Carotid artery stenosis 11/2008   bilateral 40-59% stenosis   Colon polyps    Diverticulosis    Hepatitis    doesn't know which type 1969   Hyperlipidemia    Hypertension    Numbness    fingertips   Phimosis    Prostate cancer ([place]) 2001   Urothelial carcinoma of left distal ureter ([place])     Social History   Socioeconomic History   Marital status: Single    Spouse name: Not on file   Number of children: 0   Years of education: Not on file   Highest education level: Not on file  Occupational History   Occupation: Retired  Tobacco Use   Smoking status: Never Smoker   Smokeless tobacco: Never Used   Tobacco comment: 07/29/2015 "might smoke a cigar once/year"  Vaping Use   Vaping Use: Never used  Substance and Sexual Activity   Alcohol use: Yes  Alcohol/week: 14.0 standard drinks    Types: 14 Glasses of wine per week    Comment: 07/29/2015 "big glass of wine q night"   Drug use: No   Sexual activity: Not on file  Other Topics Concern     Not on file  Social History Narrative   Patient is single, has never been married.  Does not have any children.  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   He drinks approximately 3-4 glasses of wine per week.  No history of excessive alcohol use.  Occasionally smokes a cigar.   Spends winters in [place]        Social Determinants of Health   Financial Resource Strain:    Difficulty of Paying Living Expenses:   Food Insecurity:    Worried About Charity fundraiser in the Last Year:    Arboriculturist in the Last Year:   Transportation Needs:    Film/video editor (Medical):    Lack of Transportation (Non-Medical):   Physical Activity:    Days of Exercise per Week:    Minutes of Exercise per Session:   Stress:    Feeling of Stress :   Social Connections:    Frequency of Communication with Friends and Family:    Frequency of Social Gatherings with Friends and Family:    Attends Religious Services:    Active Member of Clubs or Organizations:    Attends Archivist Meetings:    Marital Status:   Intimate Partner Violence:    Fear of Current or Ex-Partner:    Emotionally Abused:    Physically Abused:    Sexually Abused:     Past Surgical History:  Procedure Laterality Date   BIOPSY  10/01/2011   Procedure: BIOPSY;  Surgeon: [name], MD;  [facility];  Service: Urology;;  biopsy of bladder tumor   BLADDER SUSPENSION  08/18/10   [name]   CARDIAC CATHETERIZATION N/A 07/29/2015   Procedure: Right/Left Heart Cath and Coronary Angiography;  Surgeon: Peter M Martinique, MD;  Location: [place] [facility];  Service: Cardiovascular;  Laterality: N/A;   CATARACT EXTRACTION W/ INTRAOCULAR LENS  IMPLANT, BILATERAL Bilateral    CYSTOSCOPY W/ RETROGRADES  01/27/2012   Procedure: CYSTOSCOPY WITH RETROGRADE PYELOGRAM;  Surgeon: [name], MD;  [facility];  Service: Urology;  Laterality: Left;   CYSTOSCOPY WITH RETROGRADE  PYELOGRAM, URETEROSCOPY AND STENT PLACEMENT Left 11/24/2012   Procedure: CYSTOSCOPY WITH left RETROGRADE PYELOGRAM, bladder washings, ureteroscopy;  Surgeon: [name], MD;  [facility];  Service: Urology;  Laterality: Left;   CYSTOSCOPY WITH RETROGRADE PYELOGRAM, URETEROSCOPY AND STENT PLACEMENT Bilateral 11/19/2013   Procedure: CYSTOSCOPY WITH BILATERAL RETROGRADE PYELOGRAM,;  Surgeon: [name], MD;  [facility];  Service: Urology;  Laterality: Bilateral;   EYE SURGERY  09/24/10   left eye. "Retina surgery"   HIP ARTHROPLASTY Left 03/26/2019   Procedure: ARTHROPLASTY BIPOLAR HIP (HEMIARTHROPLASTY);  Surgeon: Marybelle Killings, MD;  [facility];  Service: Orthopedics;  Laterality: Left;  Depuy press fit, lateral position   [place]     s/p laparoscopic prostate surgery   SKIN BIOPSY  05/20/2017   Superficial and nodular basal cell carcinoma (left temple)    basal cell carcinoma with sclerosis (right temple)   SKIN BIOPSY Right 01/03/2019   INVASIVE SQUAMOUS CELL CARCINOMA EXTENDING TO THE DEEP MARGIN   SKIN CANCER EXCISION  a/p skin cancer right foot-non melanoma   skin laceration     TEE WITHOUT CARDIOVERSION N/A 07/29/2015   Procedure: TRANSESOPHAGEAL ECHOCARDIOGRAM (TEE);  Surgeon: Pixie Casino, MD;  Location: [facility] ENDOSCOPY;  Service: Cardiovascular;  Laterality: N/A;  cath to follow   TOTAL HIP ARTHROPLASTY Right 05/03/2019   Procedure: RIGHT HEMI HIP ARTHROPLASTY ANTERIOR APPROACH;  Surgeon: Leandrew Koyanagi, MD;  [facility];  Service: Orthopedics;  Laterality: Right;  WIll Need Retractor Holder   TRANSURETHRAL RESECTION OF BLADDER TUMOR  10/01/2011   Procedure: TRANSURETHRAL RESECTION OF BLADDER TUMOR (TURBT);  Surgeon: [name], MD;  [facility];  Service: Urology;  Laterality: N/A;   TRANSURETHRAL RESECTION OF BLADDER TUMOR N/A 11/19/2013   Procedure: TRANSURETHRAL RESECTION OF BLADDER TUMOR (TURBT);   Surgeon: [name], MD;  [facility];  Service: Urology;  Laterality: N/A;   URETEROSCOPY  01/27/2012   Procedure: URETEROSCOPY;  Surgeon: [name], MD;  [facility];  Service: Urology;  Laterality: Left;  CYSTO, Left RETROGRADE PYELOGRPHY, LEFT URETEROSCOPY    URINARY SPHINCTER IMPLANT     VARICOSE VEIN SURGERY      Family History  Problem Relation Age of Onset   Pulmonary disease Father    Goiter Father    Colon cancer Neg Hx    Colon polyps Neg Hx     No Known Allergies  Current Outpatient Medications on File Prior to Visit  Medication Sig Dispense Refill   acetaminophen (TYLENOL) 325 MG tablet Take 1-2 tablets (325-650 mg total) by mouth every 6 (six) hours as needed for mild pain or moderate pain (pain score 1-3 or temp > 100.5). 90 tablet 1   apixaban (ELIQUIS) 2.5 MG TABS tablet Take 1 tablet (2.5 mg total) by mouth 2 (two) times daily. 60 tablet 0   b complex vitamins capsule Take 1 capsule by mouth daily.     cholecalciferol (VITAMIN D) 1000 units tablet Take 1 tablet (1,000 Units total) by mouth daily. 90 tablet 3   donepezil (ARICEPT) 5 MG tablet One tablet daily 30 tablet 0   fluticasone (FLONASE) 50 MCG/ACT nasal spray Place 2 sprays into both nostrils at bedtime. 9.9 mL 0   hypromellose (SYSTANE OVERNIGHT THERAPY) 0.3 % GEL ophthalmic ointment Place 1 application into both eyes at bedtime.     loperamide (IMODIUM) 2 MG capsule Take 2 mg by mouth 4 (four) times daily as needed for diarrhea or loose stools.     metoprolol succinate (TOPROL-XL) 25 MG 24 hr tablet TAKE 1 TABLET BY MOUTH EVERYDAY AT BEDTIME 90 tablet 1   mirabegron ER (MYRBETRIQ) 50 MG TB24 tablet Take 1 tablet (50 mg total) by mouth daily. 30 tablet 0   Olopatadine HCl 0.2 % SOLN Apply 1 drop to eye as directed. For itching     phosphorus (K PHOS NEUTRAL) 155-852-130 MG tablet Take 500 mg by mouth 2 (two) times daily.     potassium chloride (MICRO-K) 10 MEQ CR capsule Take  1 capsule (10 mEq total) by mouth daily. 30 capsule 0   Probiotic Product (FLORAJEN DIGESTION PO) Take 1 capsule by mouth daily.     Tacrolimus 0.1 % CREA Apply topically. Spread topically to bilateral legs once daily     torsemide (DEMADEX) 20 MG tablet Take 1 tablet (20 mg total) by mouth daily. 30 tablet 0   No current facility-administered medications on file prior to visit.    BP 130/82    Temp 98.2 F (36.8 C)  Wt 129 lb (58.5 kg)    BMI 19.05 kg/m       Objective:   Physical Exam Nursing note reviewed.  Constitutional:      Appearance: He is cachectic. He is ill-appearing (chroncially ).  Cardiovascular:     Rate and Rhythm: Normal rate. Rhythm irregular.     Pulses: Normal pulses.     Heart sounds: Normal heart sounds.  Pulmonary:     Effort: Pulmonary effort is normal.     Breath sounds: Normal breath sounds.  Neurological:     Mental Status: He is alert and oriented to person, place, and time.     Gait: Gait abnormal.     Comments: Walks with a rolling walker    Psychiatric:        Mood and Affect: Mood normal.        Behavior: Behavior normal.        Thought Content: Thought content normal.        Judgment: Judgment normal.       Assessment & Plan:  1. Chronic fatigue -Again, advised that this is a chronic issue due to advanced age as well as multiple chronic cardiovascular issues.  He was advised to stay as active as possible but he does not need to overdo it.  Follow-up for his echo as directed by cardiology - CBC with Differential/Platelet - Basic Metabolic Panel - TSH 2. Loose stools -Order written to varus bring for patient to take probiotics to help with loose stools. - Follow up as needed - CBC with Differential/Platelet - Basic Metabolic Panel - TSH  [name], NP

## 2019-09-26 ENCOUNTER — Other Ambulatory Visit (INDEPENDENT_AMBULATORY_CARE_PROVIDER_SITE_OTHER): Payer: Medicare Other

## 2019-09-26 DIAGNOSIS — R195 Other fecal abnormalities: Secondary | ICD-10-CM

## 2019-09-26 DIAGNOSIS — S80822D Blister (nonthermal), left lower leg, subsequent encounter: Secondary | ICD-10-CM | POA: Diagnosis not present

## 2019-09-26 DIAGNOSIS — I2510 Atherosclerotic heart disease of native coronary artery without angina pectoris: Secondary | ICD-10-CM | POA: Diagnosis not present

## 2019-09-26 DIAGNOSIS — R5383 Other fatigue: Secondary | ICD-10-CM

## 2019-09-26 DIAGNOSIS — I5032 Chronic diastolic (congestive) heart failure: Secondary | ICD-10-CM | POA: Diagnosis not present

## 2019-09-26 DIAGNOSIS — S72001D Fracture of unspecified part of neck of right femur, subsequent encounter for closed fracture with routine healing: Secondary | ICD-10-CM | POA: Diagnosis not present

## 2019-09-26 DIAGNOSIS — S80821D Blister (nonthermal), right lower leg, subsequent encounter: Secondary | ICD-10-CM | POA: Diagnosis not present

## 2019-09-26 DIAGNOSIS — I110 Hypertensive heart disease with heart failure: Secondary | ICD-10-CM | POA: Diagnosis not present

## 2019-09-26 LAB — CBC WITH DIFFERENTIAL/PLATELET
Basophils Absolute: 0 10*3/uL (ref 0.0–0.1)
Basophils Relative: 0.4 % (ref 0.0–3.0)
Eosinophils Absolute: 0.1 10*3/uL (ref 0.0–0.7)
Eosinophils Relative: 1.2 % (ref 0.0–5.0)
HCT: 32.7 % — ABNORMAL LOW (ref 39.0–52.0)
Hemoglobin: 10.8 g/dL — ABNORMAL LOW (ref 13.0–17.0)
Lymphocytes Relative: 17.9 % (ref 12.0–46.0)
Lymphs Abs: 1.2 10*3/uL (ref 0.7–4.0)
MCHC: 33.1 g/dL (ref 30.0–36.0)
MCV: 89 fl (ref 78.0–100.0)
Monocytes Absolute: 0.4 10*3/uL (ref 0.1–1.0)
Monocytes Relative: 5.6 % (ref 3.0–12.0)
Neutro Abs: 5.2 10*3/uL (ref 1.4–7.7)
Neutrophils Relative %: 74.9 % (ref 43.0–77.0)
Platelets: 202 10*3/uL (ref 150.0–400.0)
RBC: 3.67 Mil/uL — ABNORMAL LOW (ref 4.22–5.81)
RDW: 16.8 % — ABNORMAL HIGH (ref 11.5–15.5)
WBC: 6.9 10*3/uL (ref 4.0–10.5)

## 2019-09-26 LAB — BASIC METABOLIC PANEL
BUN: 19 mg/dL (ref 6–23)
CO2: 31 mEq/L (ref 19–32)
Calcium: 9.3 mg/dL (ref 8.4–10.5)
Chloride: 102 mEq/L (ref 96–112)
Creatinine, Ser: 1.26 mg/dL (ref 0.40–1.50)
GFR: 53.68 mL/min — ABNORMAL LOW (ref >60.00)
Glucose, Bld: 105 mg/dL — ABNORMAL HIGH (ref 70–99)
Potassium: 3.9 mEq/L (ref 3.5–5.1)
Sodium: 141 mEq/L (ref 135–145)

## 2019-09-26 LAB — TSH: TSH: 1.85 u[IU]/mL (ref 0.35–4.50)

## 2019-09-27 ENCOUNTER — Ambulatory Visit (HOSPITAL_COMMUNITY): Payer: Medicare Other | Attending: Cardiovascular Disease

## 2019-09-27 ENCOUNTER — Telehealth: Payer: Self-pay | Admitting: Cardiovascular Disease

## 2019-09-27 ENCOUNTER — Other Ambulatory Visit: Payer: Self-pay

## 2019-09-27 DIAGNOSIS — I272 Pulmonary hypertension, unspecified: Secondary | ICD-10-CM | POA: Insufficient documentation

## 2019-09-27 DIAGNOSIS — I351 Nonrheumatic aortic (valve) insufficiency: Secondary | ICD-10-CM | POA: Insufficient documentation

## 2019-09-27 NOTE — Telephone Encounter (Signed)
Patient is here today for an echocardiogram.  He said that the "heart medicine" that Dr. Acie Fredrickson prescribed is way to expensive.  He wants to know if there is something else Dr. Acie Fredrickson can prescribe for him that is within his budget.  Call his friend, Orbie Hurst with this information.

## 2019-09-28 NOTE — Telephone Encounter (Signed)
Left message for return call.

## 2019-09-28 NOTE — Telephone Encounter (Signed)
Win Alexander returned your call.

## 2019-09-28 NOTE — Telephone Encounter (Signed)
Spoke with patient's POA, Win Alexander. I reviewed the reason for the eliquis and the alternative medications, including the regular inr checks that go along with coumadin. Questions were answered to his satisfaction and he says he will discuss the options with patient. I also advised that pt's insurance provider may have a preference in regards to the the DOACs. He verbalized understanding and thanked me for the call.  I also reviewed echo results with him. He states his wife was a Merchandiser, retail for many years and she is [place] POA. He states pt is doing well living at [facility] and he continues to socialize and go out occasionally. I advised them to call back if there are any further questions once discussed with patient. He thanked me for my time and the information.

## 2019-10-01 ENCOUNTER — Telehealth: Payer: Self-pay | Admitting: Cardiovascular Disease

## 2019-10-01 NOTE — Telephone Encounter (Signed)
     Will pt's POA calling, he is looking for [facility]. He said he's calling to follow up with her about pt.

## 2019-10-02 NOTE — Telephone Encounter (Signed)
Spoke with patient's POA, Win, who called with a question about his MyChart account. Questions were answered to Win's satisfaction. He also states pt wants to know if Dr. Acie Fredrickson also saw the echo (they have reviewed Dr. Harrington Challenger' interpretation) but did not see Dr. Elmarie Shiley. I reviewed Dr. Elmarie Shiley interpretation of the echo with Win and advised him to call back with any additional questions. He states the pt c/o breathlessness after eating but that he also has a hx of a small esophagus. I advised that the recent echo results would not likely interfere with his eating. I advised Win to advise pt to take small bites, chew slowly, do not attempt to talk while eating, and continue to monitor. I advised them to call back with any further questions or concerns.

## 2019-10-04 DIAGNOSIS — S80821D Blister (nonthermal), right lower leg, subsequent encounter: Secondary | ICD-10-CM | POA: Diagnosis not present

## 2019-10-04 DIAGNOSIS — I5032 Chronic diastolic (congestive) heart failure: Secondary | ICD-10-CM | POA: Diagnosis not present

## 2019-10-04 DIAGNOSIS — S80822D Blister (nonthermal), left lower leg, subsequent encounter: Secondary | ICD-10-CM | POA: Diagnosis not present

## 2019-10-04 DIAGNOSIS — I110 Hypertensive heart disease with heart failure: Secondary | ICD-10-CM | POA: Diagnosis not present

## 2019-10-04 DIAGNOSIS — I2510 Atherosclerotic heart disease of native coronary artery without angina pectoris: Secondary | ICD-10-CM | POA: Diagnosis not present

## 2019-10-04 DIAGNOSIS — S72001D Fracture of unspecified part of neck of right femur, subsequent encounter for closed fracture with routine healing: Secondary | ICD-10-CM | POA: Diagnosis not present

## 2019-10-09 DIAGNOSIS — I110 Hypertensive heart disease with heart failure: Secondary | ICD-10-CM | POA: Diagnosis not present

## 2019-10-09 DIAGNOSIS — I2510 Atherosclerotic heart disease of native coronary artery without angina pectoris: Secondary | ICD-10-CM | POA: Diagnosis not present

## 2019-10-09 DIAGNOSIS — S72001D Fracture of unspecified part of neck of right femur, subsequent encounter for closed fracture with routine healing: Secondary | ICD-10-CM | POA: Diagnosis not present

## 2019-10-09 DIAGNOSIS — I5032 Chronic diastolic (congestive) heart failure: Secondary | ICD-10-CM | POA: Diagnosis not present

## 2019-10-09 DIAGNOSIS — S80822D Blister (nonthermal), left lower leg, subsequent encounter: Secondary | ICD-10-CM | POA: Diagnosis not present

## 2019-10-09 DIAGNOSIS — S80821D Blister (nonthermal), right lower leg, subsequent encounter: Secondary | ICD-10-CM | POA: Diagnosis not present

## 2019-10-10 DIAGNOSIS — R5383 Other fatigue: Secondary | ICD-10-CM | POA: Diagnosis not present

## 2019-10-10 DIAGNOSIS — M25552 Pain in left hip: Secondary | ICD-10-CM | POA: Diagnosis not present

## 2019-10-10 DIAGNOSIS — S81811A Laceration without foreign body, right lower leg, initial encounter: Secondary | ICD-10-CM | POA: Diagnosis not present

## 2019-10-10 DIAGNOSIS — W19XXXA Unspecified fall, initial encounter: Secondary | ICD-10-CM | POA: Diagnosis not present

## 2019-10-12 ENCOUNTER — Other Ambulatory Visit: Payer: Self-pay

## 2019-10-12 ENCOUNTER — Non-Acute Institutional Stay: Payer: Medicare Other

## 2019-10-12 DIAGNOSIS — Z515 Encounter for palliative care: Secondary | ICD-10-CM

## 2019-10-12 NOTE — Progress Notes (Signed)
COMMUNITY PALLIATIVE CARE SW NOTE  PATIENT NAME: Jeremy Kelley DOB: 07-20-1926 MRN: [id number]  PRIMARY CARE PROVIDER: [name], NP  RESPONSIBLE PARTY:  Acct ID - Guarantor Home Phone Work Phone Relationship Acct Type  [id number] Jeremy [phone] Self P/F     [address], [address], [place], [place] [address]     PLAN OF CARE and INTERVENTIONS:             1. GOALS OF CARE/ ADVANCE CARE PLANNING:  Goal is for patient to remain in the facility as independent as possible. Patient is a FULL CODE. 2. SOCIAL/EMOTIONAL/SPIRITUAL ASSESSMENT/ INTERVENTIONS:  SW completed a face-to-face visit with patient at the facility [facility]). Patient was in his room sitting. He immediately advised SW that he needed something done to his legs. He stated showed SW several open wounds that were bleeding and weeping. He denied pain to these areas. SW observed that patient's legs and feet were swollen. Patient talked with a pressured speech, but was receptive to SW advising and affirming that the facility had contacted the home health agency to come and assess and treat his wounds. Staff report that patient appetite remains good. He refused for him to take his weights, but no weight loss is not suspected. Patient continues to have intermittent periods of agitation, but can be redirected by the staff. SW consulted with [employer] and no other concerns were noted.  3. PATIENT/CAREGIVER EDUCATION/ COPING:  Patient cope well by maintaining independence.  4. PERSONAL EMERGENCY PLAN:  Per facility protocol. 5. COMMUNITY RESOURCES COORDINATION/ HEALTH CARE NAVIGATION:  Patient receives home health services for wound care for wound care. He has access to activities at the facility for social stimulation. 6. FINANCIAL/LEGAL CONCERNS/INTERVENTIONS:  No legal or financial concerns were noted.      SOCIAL HX:  Social History   Tobacco Use   Smoking status: Never Smoker    Smokeless tobacco: Never Used   Tobacco comment: 07/29/2015 "might smoke a cigar once/year"  Substance Use Topics   Alcohol use: Yes    Alcohol/week: 14.0 standard drinks    Types: 14 Glasses of wine per week    Comment: 07/29/2015 "big glass of wine q night"    CODE STATUS: FULL CODE ADVANCED DIRECTIVES: No MOST FORM COMPLETE:  No HOSPICE EDUCATION PROVIDED: No  PPS: Patient ambulates independently with a walker. He is alert and oriented x3.  Duration of visit and documentation : [address] LCSW

## 2019-10-13 DIAGNOSIS — I110 Hypertensive heart disease with heart failure: Secondary | ICD-10-CM | POA: Diagnosis not present

## 2019-10-13 DIAGNOSIS — I2510 Atherosclerotic heart disease of native coronary artery without angina pectoris: Secondary | ICD-10-CM | POA: Diagnosis not present

## 2019-10-13 DIAGNOSIS — S72001D Fracture of unspecified part of neck of right femur, subsequent encounter for closed fracture with routine healing: Secondary | ICD-10-CM | POA: Diagnosis not present

## 2019-10-13 DIAGNOSIS — I5032 Chronic diastolic (congestive) heart failure: Secondary | ICD-10-CM | POA: Diagnosis not present

## 2019-10-13 DIAGNOSIS — S80822D Blister (nonthermal), left lower leg, subsequent encounter: Secondary | ICD-10-CM | POA: Diagnosis not present

## 2019-10-13 DIAGNOSIS — S80821D Blister (nonthermal), right lower leg, subsequent encounter: Secondary | ICD-10-CM | POA: Diagnosis not present

## 2019-10-14 DIAGNOSIS — I2510 Atherosclerotic heart disease of native coronary artery without angina pectoris: Secondary | ICD-10-CM | POA: Diagnosis not present

## 2019-10-14 DIAGNOSIS — Z7901 Long term (current) use of anticoagulants: Secondary | ICD-10-CM | POA: Diagnosis not present

## 2019-10-14 DIAGNOSIS — I110 Hypertensive heart disease with heart failure: Secondary | ICD-10-CM | POA: Diagnosis not present

## 2019-10-14 DIAGNOSIS — S41001D Unspecified open wound of right shoulder, subsequent encounter: Secondary | ICD-10-CM | POA: Diagnosis not present

## 2019-10-14 DIAGNOSIS — E785 Hyperlipidemia, unspecified: Secondary | ICD-10-CM | POA: Diagnosis not present

## 2019-10-14 DIAGNOSIS — I272 Pulmonary hypertension, unspecified: Secondary | ICD-10-CM | POA: Diagnosis not present

## 2019-10-14 DIAGNOSIS — Z9181 History of falling: Secondary | ICD-10-CM | POA: Diagnosis not present

## 2019-10-14 DIAGNOSIS — I5032 Chronic diastolic (congestive) heart failure: Secondary | ICD-10-CM | POA: Diagnosis not present

## 2019-10-14 DIAGNOSIS — I482 Chronic atrial fibrillation, unspecified: Secondary | ICD-10-CM | POA: Diagnosis not present

## 2019-10-14 DIAGNOSIS — F0390 Unspecified dementia without behavioral disturbance: Secondary | ICD-10-CM | POA: Diagnosis not present

## 2019-10-14 DIAGNOSIS — Z96641 Presence of right artificial hip joint: Secondary | ICD-10-CM | POA: Diagnosis not present

## 2019-10-14 DIAGNOSIS — Z8546 Personal history of malignant neoplasm of prostate: Secondary | ICD-10-CM | POA: Diagnosis not present

## 2019-10-14 DIAGNOSIS — S71101D Unspecified open wound, right thigh, subsequent encounter: Secondary | ICD-10-CM | POA: Diagnosis not present

## 2019-10-14 DIAGNOSIS — M549 Dorsalgia, unspecified: Secondary | ICD-10-CM | POA: Diagnosis not present

## 2019-10-14 DIAGNOSIS — G8929 Other chronic pain: Secondary | ICD-10-CM | POA: Diagnosis not present

## 2019-10-14 DIAGNOSIS — S81001D Unspecified open wound, right knee, subsequent encounter: Secondary | ICD-10-CM | POA: Diagnosis not present

## 2019-10-16 DIAGNOSIS — I2510 Atherosclerotic heart disease of native coronary artery without angina pectoris: Secondary | ICD-10-CM | POA: Diagnosis not present

## 2019-10-16 DIAGNOSIS — I5032 Chronic diastolic (congestive) heart failure: Secondary | ICD-10-CM | POA: Diagnosis not present

## 2019-10-16 DIAGNOSIS — S41001D Unspecified open wound of right shoulder, subsequent encounter: Secondary | ICD-10-CM | POA: Diagnosis not present

## 2019-10-16 DIAGNOSIS — I110 Hypertensive heart disease with heart failure: Secondary | ICD-10-CM | POA: Diagnosis not present

## 2019-10-16 DIAGNOSIS — S81001D Unspecified open wound, right knee, subsequent encounter: Secondary | ICD-10-CM | POA: Diagnosis not present

## 2019-10-16 DIAGNOSIS — S71101D Unspecified open wound, right thigh, subsequent encounter: Secondary | ICD-10-CM | POA: Diagnosis not present

## 2019-10-17 DIAGNOSIS — Z20828 Contact with and (suspected) exposure to other viral communicable diseases: Secondary | ICD-10-CM | POA: Diagnosis not present

## 2019-10-17 DIAGNOSIS — Z1159 Encounter for screening for other viral diseases: Secondary | ICD-10-CM | POA: Diagnosis not present

## 2019-10-19 DIAGNOSIS — I2510 Atherosclerotic heart disease of native coronary artery without angina pectoris: Secondary | ICD-10-CM | POA: Diagnosis not present

## 2019-10-19 DIAGNOSIS — S81001D Unspecified open wound, right knee, subsequent encounter: Secondary | ICD-10-CM | POA: Diagnosis not present

## 2019-10-19 DIAGNOSIS — I5032 Chronic diastolic (congestive) heart failure: Secondary | ICD-10-CM | POA: Diagnosis not present

## 2019-10-19 DIAGNOSIS — I110 Hypertensive heart disease with heart failure: Secondary | ICD-10-CM | POA: Diagnosis not present

## 2019-10-19 DIAGNOSIS — S71101D Unspecified open wound, right thigh, subsequent encounter: Secondary | ICD-10-CM | POA: Diagnosis not present

## 2019-10-19 DIAGNOSIS — S41001D Unspecified open wound of right shoulder, subsequent encounter: Secondary | ICD-10-CM | POA: Diagnosis not present

## 2019-10-23 DIAGNOSIS — S41001D Unspecified open wound of right shoulder, subsequent encounter: Secondary | ICD-10-CM | POA: Diagnosis not present

## 2019-10-23 DIAGNOSIS — I5032 Chronic diastolic (congestive) heart failure: Secondary | ICD-10-CM | POA: Diagnosis not present

## 2019-10-23 DIAGNOSIS — S81001D Unspecified open wound, right knee, subsequent encounter: Secondary | ICD-10-CM | POA: Diagnosis not present

## 2019-10-23 DIAGNOSIS — I110 Hypertensive heart disease with heart failure: Secondary | ICD-10-CM | POA: Diagnosis not present

## 2019-10-23 DIAGNOSIS — I2510 Atherosclerotic heart disease of native coronary artery without angina pectoris: Secondary | ICD-10-CM | POA: Diagnosis not present

## 2019-10-23 DIAGNOSIS — S71101D Unspecified open wound, right thigh, subsequent encounter: Secondary | ICD-10-CM | POA: Diagnosis not present

## 2019-10-24 DIAGNOSIS — Z1159 Encounter for screening for other viral diseases: Secondary | ICD-10-CM | POA: Diagnosis not present

## 2019-10-24 DIAGNOSIS — Z20828 Contact with and (suspected) exposure to other viral communicable diseases: Secondary | ICD-10-CM | POA: Diagnosis not present

## 2019-10-25 DIAGNOSIS — S71101D Unspecified open wound, right thigh, subsequent encounter: Secondary | ICD-10-CM | POA: Diagnosis not present

## 2019-10-25 DIAGNOSIS — I2510 Atherosclerotic heart disease of native coronary artery without angina pectoris: Secondary | ICD-10-CM | POA: Diagnosis not present

## 2019-10-25 DIAGNOSIS — I5032 Chronic diastolic (congestive) heart failure: Secondary | ICD-10-CM | POA: Diagnosis not present

## 2019-10-25 DIAGNOSIS — S81001D Unspecified open wound, right knee, subsequent encounter: Secondary | ICD-10-CM | POA: Diagnosis not present

## 2019-10-25 DIAGNOSIS — I110 Hypertensive heart disease with heart failure: Secondary | ICD-10-CM | POA: Diagnosis not present

## 2019-10-25 DIAGNOSIS — S41001D Unspecified open wound of right shoulder, subsequent encounter: Secondary | ICD-10-CM | POA: Diagnosis not present

## 2019-10-26 DIAGNOSIS — R635 Abnormal weight gain: Secondary | ICD-10-CM | POA: Diagnosis not present

## 2019-10-26 DIAGNOSIS — E559 Vitamin D deficiency, unspecified: Secondary | ICD-10-CM | POA: Diagnosis not present

## 2019-10-31 DIAGNOSIS — E870 Hyperosmolality and hypernatremia: Secondary | ICD-10-CM | POA: Diagnosis not present

## 2019-10-31 DIAGNOSIS — R0609 Other forms of dyspnea: Secondary | ICD-10-CM | POA: Diagnosis not present

## 2019-10-31 DIAGNOSIS — S71101D Unspecified open wound, right thigh, subsequent encounter: Secondary | ICD-10-CM | POA: Diagnosis not present

## 2019-10-31 DIAGNOSIS — Z20828 Contact with and (suspected) exposure to other viral communicable diseases: Secondary | ICD-10-CM | POA: Diagnosis not present

## 2019-10-31 DIAGNOSIS — S81001D Unspecified open wound, right knee, subsequent encounter: Secondary | ICD-10-CM | POA: Diagnosis not present

## 2019-10-31 DIAGNOSIS — R638 Other symptoms and signs concerning food and fluid intake: Secondary | ICD-10-CM | POA: Diagnosis not present

## 2019-10-31 DIAGNOSIS — Z1159 Encounter for screening for other viral diseases: Secondary | ICD-10-CM | POA: Diagnosis not present

## 2019-10-31 DIAGNOSIS — I2510 Atherosclerotic heart disease of native coronary artery without angina pectoris: Secondary | ICD-10-CM | POA: Diagnosis not present

## 2019-10-31 DIAGNOSIS — I110 Hypertensive heart disease with heart failure: Secondary | ICD-10-CM | POA: Diagnosis not present

## 2019-10-31 DIAGNOSIS — I5032 Chronic diastolic (congestive) heart failure: Secondary | ICD-10-CM | POA: Diagnosis not present

## 2019-10-31 DIAGNOSIS — R159 Full incontinence of feces: Secondary | ICD-10-CM | POA: Diagnosis not present

## 2019-10-31 DIAGNOSIS — R42 Dizziness and giddiness: Secondary | ICD-10-CM | POA: Diagnosis not present

## 2019-10-31 DIAGNOSIS — N179 Acute kidney failure, unspecified: Secondary | ICD-10-CM | POA: Diagnosis not present

## 2019-10-31 DIAGNOSIS — S41001D Unspecified open wound of right shoulder, subsequent encounter: Secondary | ICD-10-CM | POA: Diagnosis not present

## 2019-11-02 DIAGNOSIS — D539 Nutritional anemia, unspecified: Secondary | ICD-10-CM | POA: Diagnosis not present

## 2019-11-02 DIAGNOSIS — I5032 Chronic diastolic (congestive) heart failure: Secondary | ICD-10-CM | POA: Diagnosis not present

## 2019-11-02 DIAGNOSIS — S81001D Unspecified open wound, right knee, subsequent encounter: Secondary | ICD-10-CM | POA: Diagnosis not present

## 2019-11-02 DIAGNOSIS — S71101D Unspecified open wound, right thigh, subsequent encounter: Secondary | ICD-10-CM | POA: Diagnosis not present

## 2019-11-02 DIAGNOSIS — I2510 Atherosclerotic heart disease of native coronary artery without angina pectoris: Secondary | ICD-10-CM | POA: Diagnosis not present

## 2019-11-02 DIAGNOSIS — I110 Hypertensive heart disease with heart failure: Secondary | ICD-10-CM | POA: Diagnosis not present

## 2019-11-02 DIAGNOSIS — S41001D Unspecified open wound of right shoulder, subsequent encounter: Secondary | ICD-10-CM | POA: Diagnosis not present

## 2019-11-02 DIAGNOSIS — D473 Essential (hemorrhagic) thrombocythemia: Secondary | ICD-10-CM | POA: Diagnosis not present

## 2019-11-05 DIAGNOSIS — I110 Hypertensive heart disease with heart failure: Secondary | ICD-10-CM | POA: Diagnosis not present

## 2019-11-05 DIAGNOSIS — S71101D Unspecified open wound, right thigh, subsequent encounter: Secondary | ICD-10-CM | POA: Diagnosis not present

## 2019-11-05 DIAGNOSIS — S41001D Unspecified open wound of right shoulder, subsequent encounter: Secondary | ICD-10-CM | POA: Diagnosis not present

## 2019-11-05 DIAGNOSIS — I2510 Atherosclerotic heart disease of native coronary artery without angina pectoris: Secondary | ICD-10-CM | POA: Diagnosis not present

## 2019-11-05 DIAGNOSIS — S81001D Unspecified open wound, right knee, subsequent encounter: Secondary | ICD-10-CM | POA: Diagnosis not present

## 2019-11-05 DIAGNOSIS — I5032 Chronic diastolic (congestive) heart failure: Secondary | ICD-10-CM | POA: Diagnosis not present

## 2019-11-06 DIAGNOSIS — S71101D Unspecified open wound, right thigh, subsequent encounter: Secondary | ICD-10-CM | POA: Diagnosis not present

## 2019-11-06 DIAGNOSIS — S81001D Unspecified open wound, right knee, subsequent encounter: Secondary | ICD-10-CM | POA: Diagnosis not present

## 2019-11-06 DIAGNOSIS — S41001D Unspecified open wound of right shoulder, subsequent encounter: Secondary | ICD-10-CM | POA: Diagnosis not present

## 2019-11-07 ENCOUNTER — Telehealth: Payer: Self-pay

## 2019-11-07 NOTE — Telephone Encounter (Signed)
NOTES ON FILE FROM doctors making house calls [place]

## 2019-11-09 DIAGNOSIS — I110 Hypertensive heart disease with heart failure: Secondary | ICD-10-CM | POA: Diagnosis not present

## 2019-11-09 DIAGNOSIS — I5032 Chronic diastolic (congestive) heart failure: Secondary | ICD-10-CM | POA: Diagnosis not present

## 2019-11-09 DIAGNOSIS — S41001D Unspecified open wound of right shoulder, subsequent encounter: Secondary | ICD-10-CM | POA: Diagnosis not present

## 2019-11-09 DIAGNOSIS — I2510 Atherosclerotic heart disease of native coronary artery without angina pectoris: Secondary | ICD-10-CM | POA: Diagnosis not present

## 2019-11-09 DIAGNOSIS — S81001D Unspecified open wound, right knee, subsequent encounter: Secondary | ICD-10-CM | POA: Diagnosis not present

## 2019-11-09 DIAGNOSIS — S71101D Unspecified open wound, right thigh, subsequent encounter: Secondary | ICD-10-CM | POA: Diagnosis not present

## 2019-11-12 ENCOUNTER — Non-Acute Institutional Stay: Payer: Medicare Other

## 2019-11-12 ENCOUNTER — Other Ambulatory Visit: Payer: Self-pay

## 2019-11-12 DIAGNOSIS — Z515 Encounter for palliative care: Secondary | ICD-10-CM

## 2019-11-12 NOTE — Progress Notes (Signed)
COMMUNITY PALLIATIVE CARE SW NOTE  PATIENT NAME: Jeremy Kelley DOB: Oct 02, 1926 MRN: [id number]  PRIMARY CARE PROVIDER: [name], NP  RESPONSIBLE PARTY:  Acct ID - Guarantor Home Phone Work Phone Relationship Acct Type  [id number] Jeremy Greener661 [phone] Self P/F     [address], [address], [place], [place] [address]     PLAN OF CARE and INTERVENTIONS:             1. GOALS OF CARE/ ADVANCE CARE PLANNING:  Goal is for patient to remain in the facility as independent as possible. Patient is a FULL CODE 2. SOCIAL/EMOTIONAL/SPIRITUAL ASSESSMENT/ INTERVENTIONS:   SW completed a face-to-face visit with patient at the facility [facility]). Patient was up, dressed and waiting on a visit from a friend. He denied pain or discomfort. SW consulted with the med tech-Isotu who reported that patient continues to ambulate independently with his walker and takes his medication with applesauce. Patient's legs are healing appropriately and continues to have wound care nurse dress them.  Patient remains independent with his personal care needs. Patient was pleasant today, engaged as the shared his volunteer work and family history.  SW provided observation, assessment of needs and comfort, consult with staff and reaffirmed availability of support through the hospice program. SW to continue to visit patient regularly to assess his psychosocial needs and provide support. 3. PATIENT/CAREGIVER EDUCATION/ COPING:  Patient seems to be coping well. He has a supportive POA and friends. 4. PERSONAL EMERGENCY PLAN:  Per facility protocol.  5. COMMUNITY RESOURCES COORDINATION/ HEALTH CARE NAVIGATION:  Patient receives home health services.  6. FINANCIAL/LEGAL CONCERNS/INTERVENTIONS:  No financial or legal concerns noted.      SOCIAL HX:  Social History   Tobacco Use  . Smoking status: Never Smoker  . Smokeless tobacco: Never Used  . Tobacco comment: 07/29/2015 "might smoke a  cigar once/year"  Substance Use Topics  . Alcohol use: Yes    Alcohol/week: 14.0 standard drinks    Types: 14 Glasses of wine per week    Comment: 07/29/2015 "big glass of wine q night"    CODE STATUS: Patient is a FULL CODE ADVANCED DIRECTIVES: No MOST FORM COMPLETE: No HOSPICE EDUCATION PROVIDED: No   PPS:  Patient ambulates independently with a walker. He is alert and oriented x3.  Duration of visit and documentation: 45 minutes    Katheren Puller, LCSW

## 2019-11-13 DIAGNOSIS — Z8546 Personal history of malignant neoplasm of prostate: Secondary | ICD-10-CM | POA: Diagnosis not present

## 2019-11-13 DIAGNOSIS — I482 Chronic atrial fibrillation, unspecified: Secondary | ICD-10-CM | POA: Diagnosis not present

## 2019-11-13 DIAGNOSIS — F0390 Unspecified dementia without behavioral disturbance: Secondary | ICD-10-CM | POA: Diagnosis not present

## 2019-11-13 DIAGNOSIS — Z9181 History of falling: Secondary | ICD-10-CM | POA: Diagnosis not present

## 2019-11-13 DIAGNOSIS — E785 Hyperlipidemia, unspecified: Secondary | ICD-10-CM | POA: Diagnosis not present

## 2019-11-13 DIAGNOSIS — S71101D Unspecified open wound, right thigh, subsequent encounter: Secondary | ICD-10-CM | POA: Diagnosis not present

## 2019-11-13 DIAGNOSIS — Z96641 Presence of right artificial hip joint: Secondary | ICD-10-CM | POA: Diagnosis not present

## 2019-11-13 DIAGNOSIS — Z7901 Long term (current) use of anticoagulants: Secondary | ICD-10-CM | POA: Diagnosis not present

## 2019-11-13 DIAGNOSIS — I5032 Chronic diastolic (congestive) heart failure: Secondary | ICD-10-CM | POA: Diagnosis not present

## 2019-11-13 DIAGNOSIS — G8929 Other chronic pain: Secondary | ICD-10-CM | POA: Diagnosis not present

## 2019-11-13 DIAGNOSIS — S81001D Unspecified open wound, right knee, subsequent encounter: Secondary | ICD-10-CM | POA: Diagnosis not present

## 2019-11-13 DIAGNOSIS — I110 Hypertensive heart disease with heart failure: Secondary | ICD-10-CM | POA: Diagnosis not present

## 2019-11-13 DIAGNOSIS — I272 Pulmonary hypertension, unspecified: Secondary | ICD-10-CM | POA: Diagnosis not present

## 2019-11-13 DIAGNOSIS — S41001D Unspecified open wound of right shoulder, subsequent encounter: Secondary | ICD-10-CM | POA: Diagnosis not present

## 2019-11-13 DIAGNOSIS — M549 Dorsalgia, unspecified: Secondary | ICD-10-CM | POA: Diagnosis not present

## 2019-11-13 DIAGNOSIS — I2510 Atherosclerotic heart disease of native coronary artery without angina pectoris: Secondary | ICD-10-CM | POA: Diagnosis not present

## 2019-11-14 DIAGNOSIS — Z1159 Encounter for screening for other viral diseases: Secondary | ICD-10-CM | POA: Diagnosis not present

## 2019-11-14 DIAGNOSIS — R634 Abnormal weight loss: Secondary | ICD-10-CM | POA: Diagnosis not present

## 2019-11-14 DIAGNOSIS — I272 Pulmonary hypertension, unspecified: Secondary | ICD-10-CM | POA: Diagnosis not present

## 2019-11-14 DIAGNOSIS — R159 Full incontinence of feces: Secondary | ICD-10-CM | POA: Diagnosis not present

## 2019-11-14 DIAGNOSIS — Z20828 Contact with and (suspected) exposure to other viral communicable diseases: Secondary | ICD-10-CM | POA: Diagnosis not present

## 2019-11-14 DIAGNOSIS — N179 Acute kidney failure, unspecified: Secondary | ICD-10-CM | POA: Diagnosis not present

## 2019-11-14 DIAGNOSIS — D6489 Other specified anemias: Secondary | ICD-10-CM | POA: Diagnosis not present

## 2019-11-14 DIAGNOSIS — I352 Nonrheumatic aortic (valve) stenosis with insufficiency: Secondary | ICD-10-CM | POA: Diagnosis not present

## 2019-11-16 ENCOUNTER — Other Ambulatory Visit: Payer: Self-pay

## 2019-11-16 ENCOUNTER — Other Ambulatory Visit: Payer: Medicare Other | Admitting: *Deleted

## 2019-11-16 VITALS — BP 123/57 | HR 55 | Temp 97.5°F | Resp 18

## 2019-11-16 DIAGNOSIS — Z515 Encounter for palliative care: Secondary | ICD-10-CM

## 2019-11-19 DIAGNOSIS — R0602 Shortness of breath: Secondary | ICD-10-CM | POA: Diagnosis not present

## 2019-11-19 DIAGNOSIS — R634 Abnormal weight loss: Secondary | ICD-10-CM | POA: Diagnosis not present

## 2019-11-19 DIAGNOSIS — I351 Nonrheumatic aortic (valve) insufficiency: Secondary | ICD-10-CM | POA: Diagnosis not present

## 2019-11-19 DIAGNOSIS — D508 Other iron deficiency anemias: Secondary | ICD-10-CM | POA: Diagnosis not present

## 2019-11-19 DIAGNOSIS — I272 Pulmonary hypertension, unspecified: Secondary | ICD-10-CM | POA: Diagnosis not present

## 2019-11-20 DIAGNOSIS — I2510 Atherosclerotic heart disease of native coronary artery without angina pectoris: Secondary | ICD-10-CM | POA: Diagnosis not present

## 2019-11-20 DIAGNOSIS — S71101D Unspecified open wound, right thigh, subsequent encounter: Secondary | ICD-10-CM | POA: Diagnosis not present

## 2019-11-20 DIAGNOSIS — S81001D Unspecified open wound, right knee, subsequent encounter: Secondary | ICD-10-CM | POA: Diagnosis not present

## 2019-11-20 DIAGNOSIS — I5032 Chronic diastolic (congestive) heart failure: Secondary | ICD-10-CM | POA: Diagnosis not present

## 2019-11-20 DIAGNOSIS — S41001D Unspecified open wound of right shoulder, subsequent encounter: Secondary | ICD-10-CM | POA: Diagnosis not present

## 2019-11-20 DIAGNOSIS — I110 Hypertensive heart disease with heart failure: Secondary | ICD-10-CM | POA: Diagnosis not present

## 2019-11-21 DIAGNOSIS — Z20828 Contact with and (suspected) exposure to other viral communicable diseases: Secondary | ICD-10-CM | POA: Diagnosis not present

## 2019-11-21 DIAGNOSIS — Z1159 Encounter for screening for other viral diseases: Secondary | ICD-10-CM | POA: Diagnosis not present

## 2019-11-22 DIAGNOSIS — N179 Acute kidney failure, unspecified: Secondary | ICD-10-CM | POA: Diagnosis not present

## 2019-11-22 DIAGNOSIS — R634 Abnormal weight loss: Secondary | ICD-10-CM | POA: Diagnosis not present

## 2019-11-22 NOTE — Progress Notes (Signed)
COMMUNITY PALLIATIVE CARE RN NOTE  PATIENT NAME: Jeremy Kelley DOB: [birth year] MRN: [id number]  PRIMARY CARE PROVIDER: [name], NP  RESPONSIBLE PARTY: Jeremy Kelley Acct Type  [id number] Jeremy [phone] Self P/F     [address], [address], [place], [place] [address]   Covid-19 Pre-screening Negative  PLAN OF CARE and INTERVENTION:  1. ADVANCE CARE PLANNING/GOALS OF CARE: Goal is to remain at current AL facility (Jeremy Kelley). 2. PATIENT/CAREGIVER EDUCATION: Symptom management, safe mobility 3. DISEASE STATUS: Met with patient in his AL apartment. Upon arrival, he is lying in bed awake and alert. He denies pain at this time. He does experience some shortness of breath with exertion. Staff reports that he has to take rest periods when ambulating with his walker to the dining room. He remains able to take his own showers, dress and toilet himself. He does not allow staff to assist him with personal care. He has been having some issues with heart palpitations and occasional low blood pressures. Last week he had an incident where he became dizzy and c/o a headache. His blood pressure was 70-80s/50s. His heart rate dropped into the 40s per staff. He has an appointment with his Cardiologist this month. The staff offered to call EMS, however patient refused stating that he did not want to go to the hospital. He went to his room to lie down and wanted to be left alone. He does travel outside of the facility with friends, but this is limited at times d/t issues with ongoing diarrhea. He says that he has about 4 loose stools per day. He is currently taking Colestipol 1g twice daily to help. He would like to go out with other residents on their transportation bus, but will not because of the diarrhea. His appetite is fair. He does eat most of his food each meals, but portion sizes are very small. Spoke  with staff regarding his monthly weights. He has lost 6.4 lbs in the past month. Current wt is 123 lbs. He appears thin/frail. He has intermittent issues with leg weeping and L distal venous wound. Assessed patient's legs. No weeping noted today and wound appears to be closing with scaly skin covering it. He is being seen by [facility], home health RN. I called and spoke with Jeremy Kelley to provide patient update as requested. She is interested in whether or not patient is eligible for hospice services. Spoke with Dr. Konrad Kelley, [place] Director, who says that patient is eligible under the diagnosis of Protein calorie malnutrition. She was just wanting to gather information and wants to wait until after his Cardiology appointment to make a decision and discuss with patient. She is appreciative of phone call. Will continue to monitor.  HISTORY OF PRESENT ILLNESS: This is a 86 yo male with a past medical history of HTN, Afib, CAD, dementia, hyperlipidemia, bladder cancer and prostate cancer. Palliative care continues to follow patient and visits monthly and PRN.  CODE STATUS: DNR ADVANCED DIRECTIVES: Y MOST FORM: no PPS: 50%   PHYSICAL EXAM:   VITALS: Today's Vitals   11/16/19 1432  BP: (!) 123/57  Pulse: (!) 55  Resp: 18  Temp: (!) 97.5 F (36.4 C)  TempSrc: Temporal  SpO2: 97%  PainSc: 0-No pain    LUNGS: clear to auscultation  CARDIAC: Cor Brady EXTREMITIES: Trace lower extremity edema SKIN: Exposed skin is dry and intact, healing left distal  calf wound, heel redness (elevated on pillows)  NEURO: negative, Alert and oriented x 3, generalized weakness, ambulatory w/walker   (Duration of visit and documentation 75 minutes)   Jeremy Eastern, RN BSN

## 2019-11-26 NOTE — Progress Notes (Signed)
Cardiology Office Note    Date:  11/27/2019   ID:  Jeremy Kelley, DOB Aug 31, 1926, MRN [id number]  PCP:  [name], NP  Cardiologist:  Dr. Acie Fredrickson Chief Complaint: 3 Months follow up  History of Present Illness:   Jeremy Kelley is a 86 y.o. male with hx of persistent atrial fibrillation, severe aortic insufficiency (not a surgical candidate), pulmonary hypertension, CAD, HLD and HTN presents for follow up.  Last echo 09/27/19 showed LVEF of 50-55%, no WM abnormality, severely reduce RV function, severely elevated pulmonary hypertension with RV systolic pressure of 79 mmHG. Mild to moderate MR. Moderate to severe AI.  Dr. Acie Fredrickson recommended supportive care.   Patient is here for follow-up by himself.  No family member is present.  Very soft-spoken.  Has chronic shortness of breath and this is stable.  He wants to continue relationship with Dr. Acie Fredrickson.  For the past few days he is having diarrhea and noted tape warm in stool yesterday.   Past Medical History:  Diagnosis Date  . ABNORMAL HEART RHYTHMS 07/11/2009   Qualifier: Diagnosis of  By: Wynona Luna   . ABNORMAL THYROID FUNCTION TESTS 08/05/2009   Qualifier: Diagnosis of  By: Wynona Luna   . Actinic keratosis 11/06/2012  . Aortic insufficiency 01/04/2014  . Arthritis   . Atrial fibrillation, chronic ([place]) 07/16/2015  . Bladder cancer ([place]) 10/19/2011   Papillary urothelial carcinoma - low grade  (Dr. Alinda Money - [facility])   . CAD (coronary artery disease) 08/01/2015  . Carotid artery stenosis 11/2008   bilateral 40-59% stenosis  . CAROTID BRUIT 11/19/2008   Qualifier: Diagnosis of  By: Wynona Luna   . Closed pilon fracture, left, sequela 10/11/2016  . Colon polyps   . COLONIC POLYPS, HX OF 08/28/2007   Qualifier: Diagnosis of  By: Wynona Luna   . Dementia ([place]) 04/08/2019  . Displaced fracture of right femoral neck ([place]) 04/30/2019  . Diverticulosis   . DNR (do not resuscitate) 04/30/2019  .  Eczema 02/23/2011  . Heart murmur 09/10/2010  . Hepatitis    doesn't know which type 1969  . Hip fracture ([place]) 04/21/2019  . History of hemiarthroplasty of left hip 04/25/2019  . Hyperglycemia 02/11/2011  . Hyperlipidemia   . Hypertension   . Idiopathic chronic venous hypertension of left lower extremity with ulcer and inflammation ([place]) 10/04/2016  . Lentigo maligna of cheek ([place]) 09/19/2013  . Macular degeneration 08/10/2012  . Numbness    fingertips  . Pain in left ankle and joints of left foot 10/04/2016  . PARESTHESIA 12/04/2007   Qualifier: Diagnosis of  By: Wynona Luna   . Phimosis   . Pressure injury of skin 05/02/2019  . Prostate cancer ([place]) 2001  . PROSTATE CANCER, HX OF 11/04/2006   Qualifier: Diagnosis of  By: Wynona Luna    . Pulmonary hypertension ([place]) 07/24/2015  . S/P hip hemiarthroplasty   . Small bowel obstruction ([place]) 02/21/2019  . Unspecified cataract 10/17/2008   Qualifier: Diagnosis of  By: Wynona Luna   . 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 Varicose veins of bilateral lower extremities with other complications 3/53/2992   Qualifier: Diagnosis of  By: Jenny Reichmann MD, Hunt Oris     Past Surgical History:  Procedure Laterality Date  . BIOPSY  10/01/2011   Procedure: BIOPSY;  Surgeon: [name], MD;  Location: [facility];  Service: Urology;;  biopsy of bladder tumor  . BLADDER SUSPENSION  08/18/10   [name]  . CARDIAC CATHETERIZATION N/A 07/29/2015   Procedure: Right/Left Heart Cath and Coronary Angiography;  Surgeon: Peter M Martinique, MD;  Location: [facility];  Service: Cardiovascular;  Laterality: N/A;  . CATARACT EXTRACTION W/ INTRAOCULAR LENS  IMPLANT, BILATERAL Bilateral   . CYSTOSCOPY W/ RETROGRADES  01/27/2012   Procedure: CYSTOSCOPY WITH RETROGRADE PYELOGRAM;  Surgeon: [name], MD;  Location: [facility];  Service: Urology;  Laterality: Left;  . CYSTOSCOPY WITH RETROGRADE PYELOGRAM, URETEROSCOPY AND STENT PLACEMENT Left 11/24/2012     Procedure: CYSTOSCOPY WITH left RETROGRADE PYELOGRAM, bladder washings, ureteroscopy;  Surgeon: [name], MD;  Location: [facility];  Service: Urology;  Laterality: Left;  . CYSTOSCOPY WITH RETROGRADE PYELOGRAM, URETEROSCOPY AND STENT PLACEMENT Bilateral 11/19/2013   Procedure: CYSTOSCOPY WITH BILATERAL RETROGRADE PYELOGRAM,;  Surgeon: [name], MD;  Location: [facility];  Service: Urology;  Laterality: Bilateral;  . EYE SURGERY  09/24/10   left eye. "Retina surgery"  . HIP ARTHROPLASTY Left 03/26/2019   Procedure: ARTHROPLASTY BIPOLAR HIP (HEMIARTHROPLASTY);  Surgeon: Marybelle Killings, MD;  Location: [facility];  Service: Orthopedics;  Laterality: Left;  Depuy press fit, lateral position  . INGUINAL HERNIA REPAIR  1960  . PROSTATE SURGERY     s/p laparoscopic prostate surgery  . SKIN BIOPSY  05/20/2017   Superficial and nodular basal cell carcinoma (left temple)    basal cell carcinoma with sclerosis (right temple)  . 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 SKIN CANCER EXCISION     a/p skin cancer right foot-non melanoma  . skin laceration    . TEE WITHOUT CARDIOVERSION N/A 07/29/2015   Procedure: TRANSESOPHAGEAL ECHOCARDIOGRAM (TEE);  Surgeon: Pixie Casino, MD;  Location: [facility];  Service: Cardiovascular;  Laterality: N/A;  cath to follow  . TOTAL HIP ARTHROPLASTY Right 05/03/2019   Procedure: RIGHT HEMI HIP ARTHROPLASTY ANTERIOR APPROACH;  Surgeon: Leandrew Koyanagi, MD;  [facility];  Service: Orthopedics;  Laterality: Right;  WIll Need Retractor Holder  . TRANSURETHRAL RESECTION OF BLADDER TUMOR  10/01/2011   Procedure: TRANSURETHRAL RESECTION OF BLADDER TUMOR (TURBT);  Surgeon: [name], MD;  [facility];  Service: Urology;  Laterality: N/A;  . TRANSURETHRAL RESECTION OF BLADDER TUMOR N/A 11/19/2013   Procedure: TRANSURETHRAL RESECTION OF BLADDER TUMOR (TURBT);  Surgeon: [name], MD;  [facility];  Service:  Urology;  Laterality: N/A;  . URETEROSCOPY  01/27/2012   Procedure: URETEROSCOPY;  Surgeon: [name], MD;  [facility];  Service: Urology;  Laterality: Left;  CYSTO, Left RETROGRADE PYELOGRPHY, LEFT URETEROSCOPY   . URINARY SPHINCTER IMPLANT    . VARICOSE VEIN SURGERY      Current Medications: Prior to Admission medications   Medication Sig Start Date End Date Taking? Authorizing Provider  acetaminophen (TYLENOL) 325 MG tablet Take 1-2 tablets (325-650 mg total) by mouth every 6 (six) hours as needed for mild pain or moderate pain (pain score 1-3 or temp > 100.5). 03/30/19   Lucky Cowboy, MD  apixaban (ELIQUIS) 2.5 MG TABS tablet Take 1 tablet (2.5 mg total) by mouth 2 (two) times daily. 05/08/19   Elie Confer, MD  b complex vitamins capsule Take 1 capsule by mouth daily.    [provider]  cholecalciferol (VITAMIN D) 1000 units tablet Take 1 tablet (1,000 Units total) by mouth daily. 08/18/16   Nafziger, [name],  NP  donepezil (ARICEPT) 5 MG tablet One tablet daily 04/21/19   Hennie Duos, MD  fluticasone [facility]) 50 MCG/ACT nasal spray Place 2 sprays into both nostrils at bedtime. 05/08/19   Elie Confer, MD  hypromellose (SYSTANE OVERNIGHT THERAPY) 0.3 % GEL ophthalmic ointment Place 1 application into both eyes at bedtime.    [provider]  loperamide (IMODIUM) 2 MG capsule Take 2 mg by mouth 4 (four) times daily as needed for diarrhea or loose stools.    [provider]  metoprolol succinate (TOPROL-XL) 25 MG 24 hr tablet TAKE 1 TABLET BY MOUTH EVERYDAY AT BEDTIME 04/24/19   Nafziger, [name], NP  mirabegron ER (MYRBETRIQ) 50 MG TB24 tablet Take 1 tablet (50 mg total) by mouth daily. 05/08/19   Elie Confer, MD  Olopatadine HCl 0.2 % SOLN Apply 1 drop to eye as directed. For itching    [provider]  phosphorus (K PHOS NEUTRAL) 155-852-130 MG tablet Take 500 mg by mouth 2 (two) times daily.    [provider]  potassium  chloride (MICRO-K) 10 MEQ CR capsule Take 1 capsule (10 mEq total) by mouth daily. 04/21/19   Hennie Duos, MD  Probiotic Product [facility] DIGESTION PO) Take 1 capsule by mouth daily.    [provider]  Tacrolimus 0.1 % CREA Apply topically. Spread topically to bilateral legs once daily    [provider]  torsemide (DEMADEX) 20 MG tablet Take 1 tablet (20 mg total) by mouth daily. 04/21/19   Hennie Duos, MD    Allergies:   Patient has no known allergies.   Social History   Socioeconomic History  . Marital status: Single    Spouse name: Not on file  . Number of children: 0  . Years of education: Not on file  . Highest education level: Not on file  Occupational History  . Occupation: Retired  Tobacco Use  . Smoking status: Never Smoker  . Smokeless tobacco: Never Used  . Tobacco comment: 07/29/2015 "might smoke a cigar once/year"  Vaping Use  . Vaping Use: Never used  Substance and Sexual Activity  . Alcohol use: Yes    Alcohol/week: 14.0 standard drinks    Types: 14 Glasses of wine per week    Comment: 07/29/2015 "big glass of wine q night"  . Drug use: No  . Sexual activity: Not on file  Other Topics Concern  . Not on file  Social History Narrative   Patient is single, has never been married.  Does not have any children.  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   He drinks approximately 3-4 glasses of wine per week.  No history of excessive alcohol use.  Occasionally smokes a cigar.   Spends winters in [place]        Social Determinants of Health   Financial Resource Strain:   . Difficulty of Paying Living Expenses:   Food Insecurity:   . Worried About Charity fundraiser in the Last Year:   . Arboriculturist in the Last Year:   Transportation Needs:   . Film/video editor (Medical):   [name] Lack of Transportation (Non-Medical):   Physical Activity:   . Days of Exercise per Week:   . Minutes of Exercise per Session:    Stress:   . Feeling of Stress :   Social Connections:   . Frequency of Communication with Friends and Family:   . Frequency of Social  Gatherings with Friends and Family:   . Attends Religious Services:   . Active Member of Clubs or Organizations:   . Attends Archivist Meetings:   [name] Marital Status:      Family History:  The patient's family history includes Goiter in his father; Pulmonary disease in his father.   ROS:   Please see the history of present illness.    ROS All other systems reviewed and are negative.   PHYSICAL EXAM:   VS:  BP 112/60   Pulse 66   Ht 5\' 9"  (1.753 m)   Wt 121 lb (54.9 kg)   SpO2 98%   BMI 17.87 kg/m    GEN: thin frail elderly male in no acute distress  HEENT: normal  Neck: no JVD, carotid bruits, or masses Cardiac: irregular ; + murmurs, rubs, or gallops,no edema  Respiratory: normal work of breathing, mild rales in R base GI: soft, nontender, nondistended, + BS MS: no deformity or atrophy  Skin: warm and dry, no rash Neuro:  Alert and Oriented x 3, Strength and sensation are intact Psych: euthymic mood, full affect  Wt Readings from Last 3 Encounters:  11/27/19 121 lb (54.9 kg)  09/25/19 129 lb (58.5 kg)  09/06/19 121 lb 8 oz (55.1 kg)      Studies/Labs Reviewed:   EKG:  EKG is not ordered today.    Recent Labs: 02/21/2019: ALT 17 03/28/2019: Magnesium 2.1 09/26/2019: BUN 19; Creatinine, Ser 1.26; Hemoglobin 10.8; Platelets 202.0; Potassium 3.9; Sodium 141; TSH 1.85   Lipid Panel    Component Value Date/Time   CHOL 135 09/14/2013 0852   TRIG 62.0 09/14/2013 0852   HDL 47.00 09/14/2013 0852   CHOLHDL 3 09/14/2013 0852   VLDL 12.4 09/14/2013 0852   LDLCALC 76 09/14/2013 0852    Additional studies/ records that were reviewed today include:   Echocardiogram: 09/27/2019 1. Left ventricular ejection fraction, by estimation, is 50 to 55%. The  left ventricle has low normal function. The left ventricle has no regional   wall motion abnormalities. Left ventricular diastolic function could not  be evaluated.  2. Right ventricular systolic function is severely reduced. The right  ventricular size is severely enlarged. There is severely elevated  pulmonary artery systolic pressure. The estimated right ventricular  systolic pressure is 37.9 mmHg.  3. Left atrial size was severely dilated.  4. Right atrial size was severely dilated.  5. The mitral valve is degenerative. Mild to moderate mitral valve  regurgitation.  6. Tricuspid valve regurgitation is severe.  7. The aortic valve is tricuspid. Aortic valve regurgitation is moderate  to severe. Mild to moderate aortic valve sclerosis/calcification is  present, without any evidence of aortic stenosis.  8. The inferior vena cava is dilated in size with <50% respiratory  variability, suggesting right atrial pressure of 15 mmHg.   Right/Left Heart Cath and Coronary Angiography 07/2015  Conclusion   Ost LAD to Mid LAD lesion, 15% stenosed.  Prox Cx to Mid Cx lesion, 10% stenosed.  The left ventricular systolic function is normal.   1. No significant CAD 2. Normal LV function 3. Moderate aortic insufficiency 4. Normal right heart and LV filling pressures.  Plan: medical management.  Diagnostic Dominance: Left     ASSESSMENT & PLAN:    1.  Severe aortic insufficiency with cor pulmonale -Last echocardiogram 09/2019 as above. -Plan supportive care.  He is not a surgical candidate.  2.  Diarrhea/finding of tapeworm in stool -He will follow-up  with provider at facility  3.  Persistent atrial fibrillation -Rate control -Continue Eliquis and Toprol    Medication Adjustments/Labs and Tests Ordered: Current medicines are reviewed at length with the patient today.  Concerns regarding medicines are outlined above.  Medication changes, Labs and Tests ordered today are listed in the Patient Instructions below. Patient Instructions   Medication Instructions:  Your physician recommends that you continue on your current medications as directed. Please refer to the Current Medication list given to you today.  *If you need a refill on your cardiac medications before your next appointment, please call your pharmacy*   Lab Work: None ordered  If you have labs (blood work) drawn today and your tests are completely normal, you will receive your results only by: [name] MyChart Message (if you have MyChart) OR . A paper copy in the mail If you have any lab test that is abnormal or we need to change your treatment, we will call you to review the results.   Testing/Procedures: None ordered   Follow-Up: At [facility], you and your health needs are our priority.  As part of our continuing mission to provide you with exceptional heart care, we have created designated Provider Care Teams.  These Care Teams include your primary Cardiologist (physician) and Advanced Practice Providers (APPs -  Physician Assistants and Nurse Practitioners) who all work together to provide you with the care you need, when you need it.  We recommend signing up for the patient portal called "MyChart".  Sign up information is provided on this After Visit Summary.  MyChart is used to connect with patients for Virtual Visits (Telemedicine).  Patients are able to view lab/test results, encounter notes, upcoming appointments, etc.  Non-urgent messages can be sent to your provider as well.   To learn more about what you can do with MyChart, go to [email or web address].    Your next appointment:   6 month(s)  The format for your next appointment:   In Person  Provider:   You may see Mertie Moores, MD or one of the following Advanced Practice Providers on your designated Care Team:    Richardson Dopp, PA-C  Robbie Lis, PA-C    Other Instructions      Jarrett Soho, [place]  11/27/2019 [facility] [place], [place], [place]  [address] Phone: [phone]; Fax: [phone]

## 2019-11-27 ENCOUNTER — Ambulatory Visit (INDEPENDENT_AMBULATORY_CARE_PROVIDER_SITE_OTHER): Payer: Medicare Other | Admitting: Physician Assistant

## 2019-11-27 ENCOUNTER — Other Ambulatory Visit: Payer: Self-pay

## 2019-11-27 ENCOUNTER — Encounter: Payer: Self-pay | Admitting: Physician Assistant

## 2019-11-27 ENCOUNTER — Telehealth: Payer: Self-pay | Admitting: Nurse Practitioner

## 2019-11-27 VITALS — BP 112/60 | HR 66 | Ht 69.0 in | Wt 121.0 lb

## 2019-11-27 DIAGNOSIS — B719 Cestode infection, unspecified: Secondary | ICD-10-CM

## 2019-11-27 DIAGNOSIS — I2781 Cor pulmonale (chronic): Secondary | ICD-10-CM

## 2019-11-27 DIAGNOSIS — I4821 Permanent atrial fibrillation: Secondary | ICD-10-CM

## 2019-11-27 DIAGNOSIS — I25119 Atherosclerotic heart disease of native coronary artery with unspecified angina pectoris: Secondary | ICD-10-CM

## 2019-11-27 DIAGNOSIS — R197 Diarrhea, unspecified: Secondary | ICD-10-CM | POA: Diagnosis not present

## 2019-11-27 DIAGNOSIS — I351 Nonrheumatic aortic (valve) insufficiency: Secondary | ICD-10-CM

## 2019-11-27 NOTE — Patient Instructions (Signed)

## 2019-11-27 NOTE — Telephone Encounter (Signed)
Patient has been scheduled an appointment by his Healthcare POA for 12/06/19.

## 2019-11-28 ENCOUNTER — Telehealth: Payer: Self-pay | Admitting: Nurse Practitioner

## 2019-11-28 DIAGNOSIS — N179 Acute kidney failure, unspecified: Secondary | ICD-10-CM | POA: Diagnosis not present

## 2019-11-28 DIAGNOSIS — R159 Full incontinence of feces: Secondary | ICD-10-CM | POA: Diagnosis not present

## 2019-11-28 DIAGNOSIS — E7439 Other disorders of intestinal carbohydrate absorption: Secondary | ICD-10-CM | POA: Diagnosis not present

## 2019-11-28 DIAGNOSIS — Z20828 Contact with and (suspected) exposure to other viral communicable diseases: Secondary | ICD-10-CM | POA: Diagnosis not present

## 2019-11-28 DIAGNOSIS — R634 Abnormal weight loss: Secondary | ICD-10-CM | POA: Diagnosis not present

## 2019-11-28 DIAGNOSIS — E873 Alkalosis: Secondary | ICD-10-CM | POA: Diagnosis not present

## 2019-11-28 DIAGNOSIS — Z1159 Encounter for screening for other viral diseases: Secondary | ICD-10-CM | POA: Diagnosis not present

## 2019-11-29 ENCOUNTER — Other Ambulatory Visit: Payer: Self-pay

## 2019-11-29 DIAGNOSIS — S81001D Unspecified open wound, right knee, subsequent encounter: Secondary | ICD-10-CM | POA: Diagnosis not present

## 2019-11-29 DIAGNOSIS — I5032 Chronic diastolic (congestive) heart failure: Secondary | ICD-10-CM | POA: Diagnosis not present

## 2019-11-29 DIAGNOSIS — R197 Diarrhea, unspecified: Secondary | ICD-10-CM

## 2019-11-29 DIAGNOSIS — I2510 Atherosclerotic heart disease of native coronary artery without angina pectoris: Secondary | ICD-10-CM | POA: Diagnosis not present

## 2019-11-29 DIAGNOSIS — S71101D Unspecified open wound, right thigh, subsequent encounter: Secondary | ICD-10-CM | POA: Diagnosis not present

## 2019-11-29 DIAGNOSIS — S41001D Unspecified open wound of right shoulder, subsequent encounter: Secondary | ICD-10-CM | POA: Diagnosis not present

## 2019-11-29 DIAGNOSIS — I110 Hypertensive heart disease with heart failure: Secondary | ICD-10-CM | POA: Diagnosis not present

## 2019-11-29 NOTE — Telephone Encounter (Signed)
If I am covering today I think okay to order GI pathogen panel and Ova/parasite but will send to Dr. Hilarie Fredrickson as well as this is his patient. Thanks

## 2019-11-29 NOTE — Telephone Encounter (Signed)
Spoke with Jeremy Kelley about the patient's concerns.  He has had diarrhea for "months." No work up has been done. Patient saw in his feces what hew believes to be a worm. The patient would like to be tested for parasites. He is scheduled to be evaluated on 12/06/19. He lives in an assisted living and would have help available to collect a stool specimen if you want to order.

## 2019-11-29 NOTE — Telephone Encounter (Signed)
Thanks Richardson Landry  Agree, GI pathogen panel + stool for ova and parasites Thanks

## 2019-11-29 NOTE — Telephone Encounter (Signed)
Order faxed to [facility]. Notified Mrs Sheppard Coil of the plan. She will tell the patient about the testing that is to be done. Follow up appointment will be 12/06/19 which she says the patient is very happy about.

## 2019-11-29 NOTE — Telephone Encounter (Signed)
My mistake. Dr Hilarie Fredrickson, the patient is "very adamant" this is an issue of intestinal parasites per his medical POA. He wants testing. Do you want to order this?

## 2019-11-30 ENCOUNTER — Telehealth: Payer: Self-pay | Admitting: Nurse Practitioner

## 2019-11-30 NOTE — Telephone Encounter (Signed)
Estella from [facility] is requesting a call back from a nurse in regards to an order that was faxed over.  CB [phone]

## 2019-11-30 NOTE — Telephone Encounter (Signed)
Confirmed with Medical Technician at the patient's assisted living that the order was received. Their lab is [facility] and the pick ups are Tuesdays and Thursdays. She will contact their lab about the needed testing and the containers they need to use.

## 2019-11-30 NOTE — Telephone Encounter (Signed)
Spoke with Jeremy Kelley She has contacted the [facility] lab and inquired about the testing ordered. She confirmed the containers needed and feel confident the specimen can be submitted for pick up on Tuesday 12/04/19.

## 2019-12-01 DIAGNOSIS — N179 Acute kidney failure, unspecified: Secondary | ICD-10-CM | POA: Diagnosis not present

## 2019-12-01 DIAGNOSIS — R634 Abnormal weight loss: Secondary | ICD-10-CM | POA: Diagnosis not present

## 2019-12-01 DIAGNOSIS — R159 Full incontinence of feces: Secondary | ICD-10-CM | POA: Diagnosis not present

## 2019-12-04 DIAGNOSIS — D649 Anemia, unspecified: Secondary | ICD-10-CM | POA: Diagnosis not present

## 2019-12-04 DIAGNOSIS — R197 Diarrhea, unspecified: Secondary | ICD-10-CM | POA: Diagnosis not present

## 2019-12-04 DIAGNOSIS — R69 Illness, unspecified: Secondary | ICD-10-CM | POA: Diagnosis not present

## 2019-12-04 DIAGNOSIS — A0472 Enterocolitis due to Clostridium difficile, not specified as recurrent: Secondary | ICD-10-CM | POA: Diagnosis not present

## 2019-12-04 DIAGNOSIS — Z03818 Encounter for observation for suspected exposure to other biological agents ruled out: Secondary | ICD-10-CM | POA: Diagnosis not present

## 2019-12-04 LAB — IFOBT (OCCULT BLOOD): IFOBT: NEGATIVE

## 2019-12-06 ENCOUNTER — Encounter: Payer: Self-pay | Admitting: *Deleted

## 2019-12-06 ENCOUNTER — Other Ambulatory Visit: Payer: Medicare Other

## 2019-12-06 ENCOUNTER — Ambulatory Visit (INDEPENDENT_AMBULATORY_CARE_PROVIDER_SITE_OTHER): Payer: Medicare Other | Admitting: Physician Assistant

## 2019-12-06 VITALS — BP 100/50 | HR 60 | Ht 66.0 in | Wt 124.2 lb

## 2019-12-06 DIAGNOSIS — I2510 Atherosclerotic heart disease of native coronary artery without angina pectoris: Secondary | ICD-10-CM | POA: Diagnosis not present

## 2019-12-06 DIAGNOSIS — R159 Full incontinence of feces: Secondary | ICD-10-CM

## 2019-12-06 DIAGNOSIS — B829 Intestinal parasitism, unspecified: Secondary | ICD-10-CM

## 2019-12-06 DIAGNOSIS — S81001D Unspecified open wound, right knee, subsequent encounter: Secondary | ICD-10-CM | POA: Diagnosis not present

## 2019-12-06 DIAGNOSIS — I110 Hypertensive heart disease with heart failure: Secondary | ICD-10-CM | POA: Diagnosis not present

## 2019-12-06 DIAGNOSIS — I5032 Chronic diastolic (congestive) heart failure: Secondary | ICD-10-CM | POA: Diagnosis not present

## 2019-12-06 DIAGNOSIS — S41001D Unspecified open wound of right shoulder, subsequent encounter: Secondary | ICD-10-CM | POA: Diagnosis not present

## 2019-12-06 DIAGNOSIS — I25119 Atherosclerotic heart disease of native coronary artery with unspecified angina pectoris: Secondary | ICD-10-CM | POA: Diagnosis not present

## 2019-12-06 DIAGNOSIS — S71101D Unspecified open wound, right thigh, subsequent encounter: Secondary | ICD-10-CM | POA: Diagnosis not present

## 2019-12-06 NOTE — Patient Instructions (Addendum)
Your provider has requested that you go to the basement level for lab work before leaving today. Press "B" on the elevator. The lab is located at the first door on the left as you exit the elevator.  Please start a fiber supplement twice daily such as over the counter Metamucil.   Please start over the counter Imodium 2 mg three times daily when patient has diarrhea.   We will await stool study results.

## 2019-12-06 NOTE — Addendum Note (Signed)
Addended by: Dorisann Frames L on: 12/06/2019 04:38 PM   Modules accepted: Orders
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
## 2019-12-10 NOTE — Progress Notes (Signed)
Addendum: Reviewed and agree with assessment and management plan. Kaven Cumbie M, MD  

## 2019-12-12 DIAGNOSIS — S41001D Unspecified open wound of right shoulder, subsequent encounter: Secondary | ICD-10-CM | POA: Diagnosis not present

## 2019-12-12 DIAGNOSIS — Z20828 Contact with and (suspected) exposure to other viral communicable diseases: Secondary | ICD-10-CM | POA: Diagnosis not present

## 2019-12-12 DIAGNOSIS — Z1159 Encounter for screening for other viral diseases: Secondary | ICD-10-CM | POA: Diagnosis not present

## 2019-12-12 DIAGNOSIS — I5032 Chronic diastolic (congestive) heart failure: Secondary | ICD-10-CM | POA: Diagnosis not present

## 2019-12-12 DIAGNOSIS — S71101D Unspecified open wound, right thigh, subsequent encounter: Secondary | ICD-10-CM | POA: Diagnosis not present

## 2019-12-12 DIAGNOSIS — I2510 Atherosclerotic heart disease of native coronary artery without angina pectoris: Secondary | ICD-10-CM | POA: Diagnosis not present

## 2019-12-12 DIAGNOSIS — S81001D Unspecified open wound, right knee, subsequent encounter: Secondary | ICD-10-CM | POA: Diagnosis not present

## 2019-12-12 DIAGNOSIS — I110 Hypertensive heart disease with heart failure: Secondary | ICD-10-CM | POA: Diagnosis not present

## 2019-12-13 DIAGNOSIS — Z7901 Long term (current) use of anticoagulants: Secondary | ICD-10-CM | POA: Diagnosis not present

## 2019-12-13 DIAGNOSIS — S71101D Unspecified open wound, right thigh, subsequent encounter: Secondary | ICD-10-CM | POA: Diagnosis not present

## 2019-12-13 DIAGNOSIS — I5032 Chronic diastolic (congestive) heart failure: Secondary | ICD-10-CM | POA: Diagnosis not present

## 2019-12-13 DIAGNOSIS — I272 Pulmonary hypertension, unspecified: Secondary | ICD-10-CM | POA: Diagnosis not present

## 2019-12-13 DIAGNOSIS — I482 Chronic atrial fibrillation, unspecified: Secondary | ICD-10-CM | POA: Diagnosis not present

## 2019-12-13 DIAGNOSIS — M549 Dorsalgia, unspecified: Secondary | ICD-10-CM | POA: Diagnosis not present

## 2019-12-13 DIAGNOSIS — Z96641 Presence of right artificial hip joint: Secondary | ICD-10-CM | POA: Diagnosis not present

## 2019-12-13 DIAGNOSIS — I872 Venous insufficiency (chronic) (peripheral): Secondary | ICD-10-CM | POA: Diagnosis not present

## 2019-12-13 DIAGNOSIS — I2510 Atherosclerotic heart disease of native coronary artery without angina pectoris: Secondary | ICD-10-CM | POA: Diagnosis not present

## 2019-12-13 DIAGNOSIS — I110 Hypertensive heart disease with heart failure: Secondary | ICD-10-CM | POA: Diagnosis not present

## 2019-12-13 DIAGNOSIS — G8929 Other chronic pain: Secondary | ICD-10-CM | POA: Diagnosis not present

## 2019-12-13 DIAGNOSIS — E785 Hyperlipidemia, unspecified: Secondary | ICD-10-CM | POA: Diagnosis not present

## 2019-12-13 DIAGNOSIS — Z9181 History of falling: Secondary | ICD-10-CM | POA: Diagnosis not present

## 2019-12-13 DIAGNOSIS — F0390 Unspecified dementia without behavioral disturbance: Secondary | ICD-10-CM | POA: Diagnosis not present

## 2019-12-13 DIAGNOSIS — Z8546 Personal history of malignant neoplasm of prostate: Secondary | ICD-10-CM | POA: Diagnosis not present

## 2019-12-14 LAB — ARTHROPOD IDENTIFICATION
MICRO NUMBER: 10887911
SPECIMEN QUALITY: ADEQUATE

## 2019-12-19 DIAGNOSIS — H02132 Senile ectropion of right lower eyelid: Secondary | ICD-10-CM | POA: Diagnosis not present

## 2019-12-19 DIAGNOSIS — I2510 Atherosclerotic heart disease of native coronary artery without angina pectoris: Secondary | ICD-10-CM | POA: Diagnosis not present

## 2019-12-19 DIAGNOSIS — I272 Pulmonary hypertension, unspecified: Secondary | ICD-10-CM | POA: Diagnosis not present

## 2019-12-19 DIAGNOSIS — I5032 Chronic diastolic (congestive) heart failure: Secondary | ICD-10-CM | POA: Diagnosis not present

## 2019-12-19 DIAGNOSIS — R55 Syncope and collapse: Secondary | ICD-10-CM | POA: Diagnosis not present

## 2019-12-19 DIAGNOSIS — H02135 Senile ectropion of left lower eyelid: Secondary | ICD-10-CM | POA: Diagnosis not present

## 2019-12-19 DIAGNOSIS — I872 Venous insufficiency (chronic) (peripheral): Secondary | ICD-10-CM | POA: Diagnosis not present

## 2019-12-19 DIAGNOSIS — H16223 Keratoconjunctivitis sicca, not specified as Sjogren's, bilateral: Secondary | ICD-10-CM | POA: Diagnosis not present

## 2019-12-19 DIAGNOSIS — H1131 Conjunctival hemorrhage, right eye: Secondary | ICD-10-CM | POA: Diagnosis not present

## 2019-12-19 DIAGNOSIS — R4020 Unspecified coma: Secondary | ICD-10-CM | POA: Diagnosis not present

## 2019-12-19 DIAGNOSIS — I110 Hypertensive heart disease with heart failure: Secondary | ICD-10-CM | POA: Diagnosis not present

## 2019-12-19 DIAGNOSIS — Z20828 Contact with and (suspected) exposure to other viral communicable diseases: Secondary | ICD-10-CM | POA: Diagnosis not present

## 2019-12-19 DIAGNOSIS — F0390 Unspecified dementia without behavioral disturbance: Secondary | ICD-10-CM | POA: Diagnosis not present

## 2019-12-19 DIAGNOSIS — Z1159 Encounter for screening for other viral diseases: Secondary | ICD-10-CM | POA: Diagnosis not present

## 2019-12-20 ENCOUNTER — Ambulatory Visit: Payer: Medicare Other | Admitting: Physician Assistant

## 2019-12-21 ENCOUNTER — Emergency Department (HOSPITAL_COMMUNITY): Payer: Medicare Other

## 2019-12-21 ENCOUNTER — Inpatient Hospital Stay (HOSPITAL_COMMUNITY): Payer: Medicare Other

## 2019-12-21 ENCOUNTER — Other Ambulatory Visit: Payer: Self-pay

## 2019-12-21 ENCOUNTER — Inpatient Hospital Stay (HOSPITAL_COMMUNITY)
Admission: EM | Admit: 2019-12-21 | Discharge: 2019-12-27 | DRG: 177 | Disposition: A | Discharge status: Skilled Nursing Facility | Payer: Medicare Other | Attending: Family Medicine | Admitting: Family Medicine

## 2019-12-21 DIAGNOSIS — Z515 Encounter for palliative care: Secondary | ICD-10-CM

## 2019-12-21 DIAGNOSIS — I4819 Other persistent atrial fibrillation: Secondary | ICD-10-CM | POA: Diagnosis present

## 2019-12-21 DIAGNOSIS — R531 Weakness: Secondary | ICD-10-CM

## 2019-12-21 DIAGNOSIS — Y92009 Unspecified place in unspecified non-institutional (private) residence as the place of occurrence of the external cause: Secondary | ICD-10-CM | POA: Diagnosis not present

## 2019-12-21 DIAGNOSIS — R55 Syncope and collapse: Secondary | ICD-10-CM

## 2019-12-21 DIAGNOSIS — S3993XA Unspecified injury of pelvis, initial encounter: Secondary | ICD-10-CM | POA: Diagnosis not present

## 2019-12-21 DIAGNOSIS — J9620 Acute and chronic respiratory failure, unspecified whether with hypoxia or hypercapnia: Secondary | ICD-10-CM | POA: Diagnosis not present

## 2019-12-21 DIAGNOSIS — K56609 Unspecified intestinal obstruction, unspecified as to partial versus complete obstruction: Secondary | ICD-10-CM | POA: Diagnosis not present

## 2019-12-21 DIAGNOSIS — N183 Chronic kidney disease, stage 3 unspecified: Secondary | ICD-10-CM | POA: Diagnosis present

## 2019-12-21 DIAGNOSIS — E860 Dehydration: Secondary | ICD-10-CM | POA: Diagnosis present

## 2019-12-21 DIAGNOSIS — Z7401 Bed confinement status: Secondary | ICD-10-CM | POA: Diagnosis not present

## 2019-12-21 DIAGNOSIS — S199XXA Unspecified injury of neck, initial encounter: Secondary | ICD-10-CM | POA: Diagnosis not present

## 2019-12-21 DIAGNOSIS — Z66 Do not resuscitate: Secondary | ICD-10-CM | POA: Diagnosis present

## 2019-12-21 DIAGNOSIS — N3281 Overactive bladder: Secondary | ICD-10-CM | POA: Diagnosis not present

## 2019-12-21 DIAGNOSIS — W19XXXA Unspecified fall, initial encounter: Secondary | ICD-10-CM | POA: Diagnosis not present

## 2019-12-21 DIAGNOSIS — J302 Other seasonal allergic rhinitis: Secondary | ICD-10-CM | POA: Diagnosis not present

## 2019-12-21 DIAGNOSIS — J9601 Acute respiratory failure with hypoxia: Secondary | ICD-10-CM | POA: Diagnosis present

## 2019-12-21 DIAGNOSIS — N390 Urinary tract infection, site not specified: Secondary | ICD-10-CM | POA: Diagnosis not present

## 2019-12-21 DIAGNOSIS — R627 Adult failure to thrive: Secondary | ICD-10-CM | POA: Diagnosis present

## 2019-12-21 DIAGNOSIS — R296 Repeated falls: Secondary | ICD-10-CM | POA: Diagnosis present

## 2019-12-21 DIAGNOSIS — Y92012 Bathroom of single-family (private) house as the place of occurrence of the external cause: Secondary | ICD-10-CM

## 2019-12-21 DIAGNOSIS — R54 Age-related physical debility: Secondary | ICD-10-CM | POA: Diagnosis present

## 2019-12-21 DIAGNOSIS — I340 Nonrheumatic mitral (valve) insufficiency: Secondary | ICD-10-CM

## 2019-12-21 DIAGNOSIS — I482 Chronic atrial fibrillation, unspecified: Secondary | ICD-10-CM | POA: Diagnosis not present

## 2019-12-21 DIAGNOSIS — I951 Orthostatic hypotension: Secondary | ICD-10-CM | POA: Diagnosis not present

## 2019-12-21 DIAGNOSIS — I272 Pulmonary hypertension, unspecified: Secondary | ICD-10-CM | POA: Diagnosis not present

## 2019-12-21 DIAGNOSIS — R52 Pain, unspecified: Secondary | ICD-10-CM | POA: Diagnosis not present

## 2019-12-21 DIAGNOSIS — L89301 Pressure ulcer of unspecified buttock, stage 1: Secondary | ICD-10-CM | POA: Diagnosis not present

## 2019-12-21 DIAGNOSIS — R918 Other nonspecific abnormal finding of lung field: Secondary | ICD-10-CM | POA: Diagnosis not present

## 2019-12-21 DIAGNOSIS — R001 Bradycardia, unspecified: Secondary | ICD-10-CM | POA: Diagnosis present

## 2019-12-21 DIAGNOSIS — I4891 Unspecified atrial fibrillation: Secondary | ICD-10-CM

## 2019-12-21 DIAGNOSIS — D649 Anemia, unspecified: Secondary | ICD-10-CM | POA: Diagnosis present

## 2019-12-21 DIAGNOSIS — J3489 Other specified disorders of nose and nasal sinuses: Secondary | ICD-10-CM | POA: Diagnosis not present

## 2019-12-21 DIAGNOSIS — R634 Abnormal weight loss: Secondary | ICD-10-CM | POA: Diagnosis not present

## 2019-12-21 DIAGNOSIS — R000 Tachycardia, unspecified: Secondary | ICD-10-CM | POA: Diagnosis not present

## 2019-12-21 DIAGNOSIS — F0280 Dementia in other diseases classified elsewhere without behavioral disturbance: Secondary | ICD-10-CM

## 2019-12-21 DIAGNOSIS — M2550 Pain in unspecified joint: Secondary | ICD-10-CM | POA: Diagnosis not present

## 2019-12-21 DIAGNOSIS — E46 Unspecified protein-calorie malnutrition: Secondary | ICD-10-CM | POA: Diagnosis not present

## 2019-12-21 DIAGNOSIS — N179 Acute kidney failure, unspecified: Secondary | ICD-10-CM | POA: Diagnosis present

## 2019-12-21 DIAGNOSIS — R933 Abnormal findings on diagnostic imaging of other parts of digestive tract: Secondary | ICD-10-CM

## 2019-12-21 DIAGNOSIS — Z789 Other specified health status: Secondary | ICD-10-CM | POA: Diagnosis not present

## 2019-12-21 DIAGNOSIS — S3991XA Unspecified injury of abdomen, initial encounter: Secondary | ICD-10-CM | POA: Diagnosis not present

## 2019-12-21 DIAGNOSIS — Z20822 Contact with and (suspected) exposure to covid-19: Secondary | ICD-10-CM | POA: Diagnosis present

## 2019-12-21 DIAGNOSIS — S2232XA Fracture of one rib, left side, initial encounter for closed fracture: Secondary | ICD-10-CM | POA: Diagnosis not present

## 2019-12-21 DIAGNOSIS — Z7901 Long term (current) use of anticoagulants: Secondary | ICD-10-CM

## 2019-12-21 DIAGNOSIS — K228 Other specified diseases of esophagus: Secondary | ICD-10-CM | POA: Diagnosis present

## 2019-12-21 DIAGNOSIS — M1712 Unilateral primary osteoarthritis, left knee: Secondary | ICD-10-CM | POA: Diagnosis not present

## 2019-12-21 DIAGNOSIS — G309 Alzheimer's disease, unspecified: Secondary | ICD-10-CM | POA: Diagnosis present

## 2019-12-21 DIAGNOSIS — I351 Nonrheumatic aortic (valve) insufficiency: Secondary | ICD-10-CM | POA: Diagnosis present

## 2019-12-21 DIAGNOSIS — S41112A Laceration without foreign body of left upper arm, initial encounter: Secondary | ICD-10-CM | POA: Diagnosis present

## 2019-12-21 DIAGNOSIS — L8990 Pressure ulcer of unspecified site, unspecified stage: Secondary | ICD-10-CM | POA: Insufficient documentation

## 2019-12-21 DIAGNOSIS — I361 Nonrheumatic tricuspid (valve) insufficiency: Secondary | ICD-10-CM | POA: Diagnosis not present

## 2019-12-21 DIAGNOSIS — I6782 Cerebral ischemia: Secondary | ICD-10-CM | POA: Diagnosis not present

## 2019-12-21 DIAGNOSIS — R1312 Dysphagia, oropharyngeal phase: Secondary | ICD-10-CM | POA: Diagnosis present

## 2019-12-21 DIAGNOSIS — L89151 Pressure ulcer of sacral region, stage 1: Secondary | ICD-10-CM | POA: Diagnosis present

## 2019-12-21 DIAGNOSIS — M7989 Other specified soft tissue disorders: Secondary | ICD-10-CM | POA: Diagnosis not present

## 2019-12-21 DIAGNOSIS — S8992XA Unspecified injury of left lower leg, initial encounter: Secondary | ICD-10-CM | POA: Diagnosis not present

## 2019-12-21 DIAGNOSIS — I1310 Hypertensive heart and chronic kidney disease without heart failure, with stage 1 through stage 4 chronic kidney disease, or unspecified chronic kidney disease: Secondary | ICD-10-CM | POA: Diagnosis not present

## 2019-12-21 DIAGNOSIS — R1310 Dysphagia, unspecified: Secondary | ICD-10-CM | POA: Diagnosis not present

## 2019-12-21 DIAGNOSIS — E785 Hyperlipidemia, unspecified: Secondary | ICD-10-CM | POA: Diagnosis not present

## 2019-12-21 DIAGNOSIS — E43 Unspecified severe protein-calorie malnutrition: Secondary | ICD-10-CM | POA: Diagnosis not present

## 2019-12-21 DIAGNOSIS — N1831 Chronic kidney disease, stage 3a: Secondary | ICD-10-CM | POA: Diagnosis not present

## 2019-12-21 DIAGNOSIS — K5730 Diverticulosis of large intestine without perforation or abscess without bleeding: Secondary | ICD-10-CM | POA: Diagnosis not present

## 2019-12-21 DIAGNOSIS — Z681 Body mass index (BMI) 19 or less, adult: Secondary | ICD-10-CM | POA: Diagnosis not present

## 2019-12-21 DIAGNOSIS — J341 Cyst and mucocele of nose and nasal sinus: Secondary | ICD-10-CM | POA: Diagnosis not present

## 2019-12-21 DIAGNOSIS — I2781 Cor pulmonale (chronic): Secondary | ICD-10-CM | POA: Diagnosis present

## 2019-12-21 DIAGNOSIS — Z7189 Other specified counseling: Secondary | ICD-10-CM

## 2019-12-21 DIAGNOSIS — I700 Atherosclerosis of aorta: Secondary | ICD-10-CM | POA: Diagnosis not present

## 2019-12-21 DIAGNOSIS — I517 Cardiomegaly: Secondary | ICD-10-CM | POA: Diagnosis not present

## 2019-12-21 DIAGNOSIS — S59902A Unspecified injury of left elbow, initial encounter: Secondary | ICD-10-CM | POA: Diagnosis not present

## 2019-12-21 DIAGNOSIS — Z79899 Other long term (current) drug therapy: Secondary | ICD-10-CM

## 2019-12-21 DIAGNOSIS — K220 Achalasia of cardia: Secondary | ICD-10-CM | POA: Diagnosis not present

## 2019-12-21 DIAGNOSIS — R159 Full incontinence of feces: Secondary | ICD-10-CM | POA: Diagnosis present

## 2019-12-21 DIAGNOSIS — Z9981 Dependence on supplemental oxygen: Secondary | ICD-10-CM | POA: Diagnosis not present

## 2019-12-21 DIAGNOSIS — I739 Peripheral vascular disease, unspecified: Secondary | ICD-10-CM | POA: Diagnosis not present

## 2019-12-21 DIAGNOSIS — S0990XA Unspecified injury of head, initial encounter: Secondary | ICD-10-CM | POA: Diagnosis not present

## 2019-12-21 DIAGNOSIS — T07XXXA Unspecified multiple injuries, initial encounter: Secondary | ICD-10-CM | POA: Diagnosis not present

## 2019-12-21 DIAGNOSIS — W1811XA Fall from or off toilet without subsequent striking against object, initial encounter: Secondary | ICD-10-CM | POA: Diagnosis present

## 2019-12-21 DIAGNOSIS — R0902 Hypoxemia: Secondary | ICD-10-CM | POA: Diagnosis not present

## 2019-12-21 DIAGNOSIS — J690 Pneumonitis due to inhalation of food and vomit: Secondary | ICD-10-CM | POA: Diagnosis not present

## 2019-12-21 DIAGNOSIS — J90 Pleural effusion, not elsewhere classified: Secondary | ICD-10-CM | POA: Diagnosis not present

## 2019-12-21 DIAGNOSIS — G319 Degenerative disease of nervous system, unspecified: Secondary | ICD-10-CM | POA: Diagnosis not present

## 2019-12-21 DIAGNOSIS — J439 Emphysema, unspecified: Secondary | ICD-10-CM | POA: Diagnosis not present

## 2019-12-21 DIAGNOSIS — J438 Other emphysema: Secondary | ICD-10-CM | POA: Diagnosis not present

## 2019-12-21 DIAGNOSIS — L853 Xerosis cutis: Secondary | ICD-10-CM | POA: Diagnosis not present

## 2019-12-21 DIAGNOSIS — R58 Hemorrhage, not elsewhere classified: Secondary | ICD-10-CM | POA: Diagnosis not present

## 2019-12-21 LAB — PROTIME-INR
INR: 1.5 — ABNORMAL HIGH (ref 0.8–1.2)
Prothrombin Time: 17.6 seconds — ABNORMAL HIGH (ref 11.4–15.2)

## 2019-12-21 LAB — I-STAT CHEM 8, ED
BUN: 28 mg/dL — ABNORMAL HIGH (ref 8–23)
Calcium, Ion: 1.2 mmol/L (ref 1.15–1.40)
Chloride: 105 mmol/L (ref 98–111)
Creatinine, Ser: 1.5 mg/dL — ABNORMAL HIGH (ref 0.61–1.24)
Glucose, Bld: 108 mg/dL — ABNORMAL HIGH (ref 70–99)
HCT: 37 % — ABNORMAL LOW (ref 39.0–52.0)
Hemoglobin: 12.6 g/dL — ABNORMAL LOW (ref 13.0–17.0)
Potassium: 3.7 mmol/L (ref 3.5–5.1)
Sodium: 144 mmol/L (ref 135–145)
TCO2: 25 mmol/L (ref 22–32)

## 2019-12-21 LAB — ETHANOL: Alcohol, Ethyl (B): <10 mg/dL (ref <10)

## 2019-12-21 LAB — COMPREHENSIVE METABOLIC PANEL
ALT: 21 U/L (ref 0–44)
AST: 29 U/L (ref 15–41)
Albumin: 3.4 g/dL — ABNORMAL LOW (ref 3.5–5.0)
Alkaline Phosphatase: 105 U/L (ref 38–126)
Anion gap: 13 (ref 5–15)
BUN: 26 mg/dL — ABNORMAL HIGH (ref 8–23)
CO2: 25 mmol/L (ref 22–32)
Calcium: 9.2 mg/dL (ref 8.9–10.3)
Chloride: 105 mmol/L (ref 98–111)
Creatinine, Ser: 1.59 mg/dL — ABNORMAL HIGH (ref 0.61–1.24)
GFR calc Af Amer: 44 mL/min — ABNORMAL LOW (ref >60)
GFR calc non Af Amer: 38 mL/min — ABNORMAL LOW (ref >60)
Glucose, Bld: 111 mg/dL — ABNORMAL HIGH (ref 70–99)
Potassium: 3.7 mmol/L (ref 3.5–5.1)
Sodium: 143 mmol/L (ref 135–145)
Total Bilirubin: 1.3 mg/dL — ABNORMAL HIGH (ref 0.3–1.2)
Total Protein: 6 g/dL — ABNORMAL LOW (ref 6.5–8.1)

## 2019-12-21 LAB — CBC
HCT: 39.2 % (ref 39.0–52.0)
Hemoglobin: 11.5 g/dL — ABNORMAL LOW (ref 13.0–17.0)
MCH: 28.1 pg (ref 26.0–34.0)
MCHC: 29.3 g/dL — ABNORMAL LOW (ref 30.0–36.0)
MCV: 95.8 fL (ref 80.0–100.0)
Platelets: 210 10*3/uL (ref 150–400)
RBC: 4.09 MIL/uL — ABNORMAL LOW (ref 4.22–5.81)
RDW: 18.8 % — ABNORMAL HIGH (ref 11.5–15.5)
WBC: 13.8 10*3/uL — ABNORMAL HIGH (ref 4.0–10.5)
nRBC: 0 % (ref 0.0–0.2)

## 2019-12-21 LAB — CBG MONITORING, ED: Glucose-Capillary: 102 mg/dL — ABNORMAL HIGH (ref 70–99)

## 2019-12-21 LAB — SAMPLE TO BLOOD BANK

## 2019-12-21 LAB — LACTIC ACID, PLASMA: Lactic Acid, Venous: 3 mmol/L (ref 0.5–1.9)

## 2019-12-21 LAB — ECHOCARDIOGRAM COMPLETE
Area-P 1/2: 5.13 cm2
Height: 71 in
S' Lateral: 2.6 cm
Weight: 2080 oz

## 2019-12-21 LAB — SARS CORONAVIRUS 2 BY RT PCR (HOSPITAL ORDER, PERFORMED IN ~~LOC~~ HOSPITAL LAB): SARS Coronavirus 2: NEGATIVE

## 2019-12-21 MED ORDER — COLESTIPOL HCL 1 G PO TABS
1.0000 g | ORAL_TABLET | Freq: Two times a day (BID) | ORAL | Status: DC
  Administered 2019-12-21 – 2019-12-26 (×12): 1 g via ORAL
  Filled 2019-12-21 (×15): qty 1

## 2019-12-21 MED ORDER — VANCOMYCIN HCL IN DEXTROSE 1-5 GM/200ML-% IV SOLN
1000.0000 mg | Freq: Once | INTRAVENOUS | Status: AC
  Administered 2019-12-21: 1000 mg via INTRAVENOUS
  Filled 2019-12-21: qty 200

## 2019-12-21 MED ORDER — APIXABAN 2.5 MG PO TABS
2.5000 mg | ORAL_TABLET | Freq: Two times a day (BID) | ORAL | Status: DC
  Filled 2019-12-21: qty 1

## 2019-12-21 MED ORDER — SODIUM CHLORIDE 0.9 % IV SOLN: INTRAVENOUS | Status: AC

## 2019-12-21 MED ORDER — TORSEMIDE 20 MG PO TABS
20.0000 mg | ORAL_TABLET | Freq: Every day | ORAL | Status: DC
  Filled 2019-12-21: qty 1

## 2019-12-21 MED ORDER — OLOPATADINE HCL 0.1 % OP SOLN
1.0000 [drp] | Freq: Two times a day (BID) | OPHTHALMIC | Status: DC
  Administered 2019-12-21 – 2019-12-26 (×11): 1 [drp] via OPHTHALMIC
  Filled 2019-12-21 (×2): qty 5

## 2019-12-21 MED ORDER — B COMPLEX-C PO TABS
1.0000 | ORAL_TABLET | Freq: Every day | ORAL | Status: DC
  Administered 2019-12-22 – 2019-12-26 (×5): 1 via ORAL
  Filled 2019-12-21 (×6): qty 1

## 2019-12-21 MED ORDER — RISAQUAD PO CAPS
1.0000 | ORAL_CAPSULE | Freq: Every day | ORAL | Status: DC
  Administered 2019-12-22 – 2019-12-26 (×5): 1 via ORAL
  Filled 2019-12-21 (×6): qty 1

## 2019-12-21 MED ORDER — SODIUM CHLORIDE 0.9 % IV SOLN
3.0000 g | Freq: Two times a day (BID) | INTRAVENOUS | Status: DC
  Administered 2019-12-21 – 2019-12-23 (×4): 3 g via INTRAVENOUS
  Filled 2019-12-21 (×4): qty 3
  Filled 2019-12-21 (×2): qty 8

## 2019-12-21 MED ORDER — METOPROLOL TARTRATE 25 MG PO TABS
25.0000 mg | ORAL_TABLET | Freq: Once | ORAL | Status: AC
  Administered 2019-12-21: 25 mg via ORAL
  Filled 2019-12-21: qty 1

## 2019-12-21 MED ORDER — PSYLLIUM 95 % PO PACK
1.0000 | PACK | Freq: Every day | ORAL | Status: DC
  Administered 2019-12-23 – 2019-12-26 (×4): 1 via ORAL
  Filled 2019-12-21 (×5): qty 1

## 2019-12-21 MED ORDER — ACETAMINOPHEN 325 MG PO TABS: 650.0000 mg | ORAL_TABLET | Freq: Four times a day (QID) | ORAL | Status: DC | PRN

## 2019-12-21 MED ORDER — METOPROLOL SUCCINATE ER 25 MG PO TB24
25.0000 mg | ORAL_TABLET | Freq: Every day | ORAL | Status: DC
  Filled 2019-12-21: qty 1

## 2019-12-21 MED ORDER — DONEPEZIL HCL 10 MG PO TABS
10.0000 mg | ORAL_TABLET | Freq: Every day | ORAL | Status: DC
  Administered 2019-12-21 – 2019-12-26 (×6): 10 mg via ORAL
  Filled 2019-12-21 (×7): qty 1

## 2019-12-21 MED ORDER — LOPERAMIDE HCL 2 MG PO CAPS
2.0000 mg | ORAL_CAPSULE | Freq: Three times a day (TID) | ORAL | Status: DC
  Administered 2019-12-21 – 2019-12-26 (×16): 2 mg via ORAL
  Filled 2019-12-21 (×18): qty 1

## 2019-12-21 MED ORDER — SODIUM CHLORIDE 0.9 % IV SOLN
1.5000 g | Freq: Two times a day (BID) | INTRAVENOUS | Status: DC
  Filled 2019-12-21: qty 4

## 2019-12-21 MED ORDER — LOPERAMIDE HCL 2 MG PO CAPS: 2.0000 mg | ORAL_CAPSULE | Freq: Four times a day (QID) | ORAL | Status: DC | PRN

## 2019-12-21 MED ORDER — HEPARIN SODIUM (PORCINE) 5000 UNIT/ML IJ SOLN
5000.0000 [IU] | Freq: Two times a day (BID) | INTRAMUSCULAR | Status: DC
  Administered 2019-12-21 – 2019-12-22 (×3): 5000 [IU] via SUBCUTANEOUS
  Filled 2019-12-21 (×4): qty 1

## 2019-12-21 MED ORDER — POLYVINYL ALCOHOL 1.4 % OP SOLN
1.0000 [drp] | Freq: Every day | OPHTHALMIC | Status: DC
  Administered 2019-12-21 – 2019-12-26 (×5): 1 [drp] via OPHTHALMIC
  Filled 2019-12-21 (×2): qty 15

## 2019-12-21 MED ORDER — POTASSIUM CHLORIDE CRYS ER 10 MEQ PO TBCR
10.0000 meq | EXTENDED_RELEASE_TABLET | Freq: Every day | ORAL | Status: DC
  Administered 2019-12-22: 10 meq via ORAL
  Filled 2019-12-21: qty 1

## 2019-12-21 MED ORDER — IOHEXOL 300 MG/ML  SOLN
100.0000 mL | Freq: Once | INTRAMUSCULAR | Status: AC | PRN
  Administered 2019-12-21: 100 mL via INTRAVENOUS

## 2019-12-21 MED ORDER — IPRATROPIUM BROMIDE 0.06 % NA SOLN
2.0000 | Freq: Three times a day (TID) | NASAL | Status: DC
  Administered 2019-12-21 – 2019-12-26 (×11): 2 via NASAL
  Filled 2019-12-21 (×2): qty 15

## 2019-12-21 MED ORDER — SODIUM CHLORIDE 0.9% FLUSH
3.0000 mL | Freq: Two times a day (BID) | INTRAVENOUS | Status: DC
  Administered 2019-12-21 – 2019-12-26 (×8): 3 mL via INTRAVENOUS

## 2019-12-21 MED ORDER — PIPERACILLIN-TAZOBACTAM 3.375 G IVPB 30 MIN
3.3750 g | Freq: Once | INTRAVENOUS | Status: AC
  Administered 2019-12-21: 3.375 g via INTRAVENOUS
  Filled 2019-12-21: qty 50

## 2019-12-21 MED ORDER — LACTATED RINGERS IV BOLUS
1000.0000 mL | Freq: Once | INTRAVENOUS | Status: AC
  Administered 2019-12-21: 1000 mL via INTRAVENOUS

## 2019-12-21 MED ORDER — MIRABEGRON ER 50 MG PO TB24
50.0000 mg | ORAL_TABLET | Freq: Every day | ORAL | Status: DC
  Administered 2019-12-22 – 2019-12-26 (×5): 50 mg via ORAL
  Filled 2019-12-21: qty 1
  Filled 2019-12-21: qty 2
  Filled 2019-12-21 (×4): qty 1

## 2019-12-21 MED ORDER — VITAMIN D 25 MCG (1000 UNIT) PO TABS
1000.0000 [IU] | ORAL_TABLET | Freq: Every day | ORAL | Status: DC
  Administered 2019-12-22 – 2019-12-26 (×5): 1000 [IU] via ORAL
  Filled 2019-12-21 (×6): qty 1

## 2019-12-21 NOTE — Consult Note (Signed)
Referring Provider:  [facility]         Primary Care Physician:  [name], NP Primary Gastroenterologist:  Zenovia Jarred, MD            We were asked to see this patient for:   dysphagia               ASSESSMENT / PLAN:    **Patient has 2 charts, marked for a merging  # Chronic dysphagia / abnormal esophagus on chest CT scan showing severely dilated esophagus. Rule out achalasia.  --SLP has just evaluated, bedside swallow eval suggest esophageal dysphagia. We discussed CT scan findings. SLP making diet recommendations.  --Plan is for EGD with possible botox injection this weekend. Patient needs to be stable from pulmonary standpoint prior to proceeding. Also, probably cannot do until Sunday since he has been on Eliquis.  --Per patient request I called [name], is POA, to discuss plans for EGD.     # Hypoxic respiratory failure / multilobar PNA --possibly component of aspiration --Complains of SOB. Sats up from 80% to 93 % on 2 L per [place] --Getting IV antibiotics.   # Syncope, frequent falls at home  # Alzheimers dementia / failure to thrive.  --Followed by Palliative Care who will be seeing him inpatient --On Aricept   # AKI --Cr 1.59, it was 1.26 in June 2021 ( other chart)  # Mild normocytic anemia, chronic and stable.  --hgb 12.6. Baseline hgb 10.5 to 12.5.  #Atrial fibrillation on Eliquis at home. --May need to stop Eliquis given frequent falls --Can we at least hold it inpatient for possible EGD w/ botox this weekend.      HPI:                                                                                                                             Chief Complaint:  Swallowing problems.   BRONX BROGDEN is a 86 y.o. male with a pmh significant for, not necessarily limited to: Bladder cancer, dementia, pulmonary hypertension, emphysema, atrial fibrillation on Eliquis, diverticulosis, GERD/chronic dysphagia, chronic fecal leakage.   Patient known to  [place]. He has a longstanding history of lwith episodes of silent aspiration on MBSS.  He presented to the ED today after syncope/fall.  His medical POA described deterioration in patient's overall condition over the last year.  He has had multiple episodes of falling at home. He has been losing weight.  Being  followed by palliative care .  In the ED his O2 saturations were in the eighties.  No acute fractures or dislocation on trauma survey.  Chest CT scan showed a severely dilated esophagus and multilobar pneumonia  Patient says his chronic swallowing problems have gotten worse lately.I spoke with [name] his POA who says it takes patient a long time to eat though during meals he doesn't cough much or complain of food getting stuck. He does tell [name] that he just needs  thinner foods. Patient is chewing longer to get food in smaller pieces so nothing gets stuck. He denies regurgitation / heartburn. He has chronic fecal leakage, addressed at outpatient appointments. No other GI complaints.   Previous Endoscopic Evaluations / Pertinent Studies:   MBSS Aug 2020 Mild oral and moderate pharyngeal dysphagia continues with ongoing sensory motor deficits.  Decreased lingual strength results in delayed oral transiting with solids/puree more than liquids.  Decreased laryngeal elevation/closure allows laryngeal penetration/aspiration of thin.  Patient does not sense trace aspiration but did produce reflexive cough x2 with larger amount of aspiration. Cough did not clear aspirates however.    Prior to Admission medications   Medication Sig Start Date End Date Taking? Authorizing Provider  acetaminophen (TYLENOL) 325 MG tablet Take 650 mg by mouth every 6 (six) hours as needed for mild pain.   Yes [provider]  B Complex Vitamins (VITAMIN B COMPLEX) TABS Take 1 tablet by mouth daily.   Yes [provider]  cholecalciferol (VITAMIN D3) 25 MCG (1000 UNIT) tablet Take 1,000 Units by mouth daily.    Yes [provider]  colestipol (COLESTID) 1 g tablet Take 1 g by mouth 2 (two) times daily. 12/11/19  Yes [provider]  donepezil (ARICEPT) 10 MG tablet Take 10 mg by mouth at bedtime. 12/12/19  Yes [provider]  ELIQUIS 2.5 MG TABS tablet Take 2.5 mg by mouth 2 (two) times daily. 12/19/19  Yes [provider]  ipratropium (ATROVENT) 0.06 % nasal spray Place 2 sprays into both nostrils 2 (two) times daily as needed for congestion. 11/13/19  Yes [provider]  K Phos Mono-Sod Phos Di & Mono (VIRT-PHOS 250 NEUTRAL PO) Take 500 mg by mouth in the morning and at bedtime.   Yes [provider]  liver oil-zinc oxide (DESITIN) 40 % ointment Apply 1 application topically at bedtime. Apply to coccyx/sacrum area   Yes [provider]  loperamide (IMODIUM) 2 MG capsule Take 2 mg by mouth 3 (three) times daily. 12/12/19  Yes [provider]  loperamide (IMODIUM) 2 MG capsule Take 2 mg by mouth 4 (four) times daily as needed for diarrhea or loose stools.   Yes [provider]  metoprolol succinate (TOPROL-XL) 25 MG 24 hr tablet Take 25 mg by mouth at bedtime. 12/19/19  Yes [provider]  MYRBETRIQ 50 MG TB24 tablet Take 50 mg by mouth daily. 11/27/19  Yes [provider]  NONFORMULARY OR COMPOUNDED ITEM Apply 1 application topically at bedtime. Apply to affected area(s)  TAC 0.1% lotion/eucerin 1:1   Yes [provider]  Olopatadine HCl 0.2 % SOLN Place 1 drop into both eyes daily. Itching/allergy   Yes [provider]  Polyethyl Glycol-Propyl Glycol (SYSTANE) 0.4-0.3 % GEL ophthalmic gel Place 1 application into both eyes at bedtime.   Yes [provider]  potassium chloride (KLOR-CON) 10 MEQ tablet Take 10 mEq by mouth daily.   Yes [provider]  Probiotic Product (FLORAJEN DIGESTION PO) Take 1 capsule by mouth daily.   Yes [provider]  Psyllium (FIBER) 28.3 %  POWD Take 30 mLs by mouth in the morning and at bedtime. Mix with suitable liquid   Yes [provider]  torsemide (DEMADEX) 20 MG tablet Take 20 mg by mouth daily. 12/19/19  Yes [provider]    Current Facility-Administered Medications  Medication Dose Route Frequency Provider Last Rate Last Admin  . 0.9 %  sodium chloride infusion  Intravenous Continuous Lequita Halt, MD 100 mL/hr at 12/21/19 1427 New Bag at 12/21/19 1427  . acetaminophen (TYLENOL) tablet 650 mg  650 mg Oral Q6H PRN Wynetta Fines T, MD      . Derrill Memo ON 12/22/2019] acidophilus (RISAQUAD) capsule 1 capsule  1 capsule Oral Daily Zhang, Ping T, MD      . Ampicillin-Sulbactam (UNASYN) 3 g in sodium chloride 0.9 % 100 mL IVPB  3 g Intravenous Q12H Amedeo Plenty, [place]      . apixaban (ELIQUIS) tablet 2.5 mg  2.5 mg Oral BID Lequita Halt, MD      . Derrill Memo ON 12/22/2019] B-complex with vitamin C tablet 1 tablet  1 tablet Oral Daily Wynetta Fines T, MD      . Derrill Memo ON 12/22/2019] cholecalciferol (VITAMIN D3) tablet 1,000 Units  1,000 Units Oral Daily Wynetta Fines T, MD      . colestipol (COLESTID) tablet 1 g  1 g Oral BID Wynetta Fines T, MD      . donepezil (ARICEPT) tablet 10 mg  10 mg Oral QHS Wynetta Fines T, MD      . ipratropium (ATROVENT) 0.06 % nasal spray 2 spray  2 spray Each Nare TID Wynetta Fines T, MD      . loperamide (IMODIUM) capsule 2 mg  2 mg Oral TID Wynetta Fines T, MD      . loperamide (IMODIUM) capsule 2 mg  2 mg Oral QID PRN Wynetta Fines T, MD      . metoprolol succinate (TOPROL-XL) 24 hr tablet 25 mg  25 mg Oral QHS Wynetta Fines T, MD      . Derrill Memo ON 12/22/2019] mirabegron ER (MYRBETRIQ) tablet 50 mg  50 mg Oral Daily Wynetta Fines T, MD      . olopatadine (PATANOL) 0.1 % ophthalmic solution 1 drop  1 drop Both Eyes BID Wynetta Fines T, MD      . polyvinyl alcohol (LIQUIFILM TEARS) 1.4 % ophthalmic solution 1 drop  1 drop Both Eyes QHS Wynetta Fines T, MD      . Derrill Memo ON 12/22/2019] potassium chloride  (KLOR-CON) CR tablet 10 mEq  10 mEq Oral Daily Wynetta Fines T, MD      . Derrill Memo ON 12/22/2019] psyllium (HYDROCIL/METAMUCIL) 1 packet  1 packet Oral Daily Wynetta Fines T, MD      . sodium chloride flush (NS) 0.9 % injection 3 mL  3 mL Intravenous Q12H Wynetta Fines T, MD   3 mL at 12/21/19 1406  . [START ON 12/22/2019] torsemide (DEMADEX) tablet 20 mg  20 mg Oral Daily Lequita Halt, MD       Current Outpatient Medications  Medication Sig Dispense Refill  . acetaminophen (TYLENOL) 325 MG tablet Take 650 mg by mouth every 6 (six) hours as needed for mild pain.    . B Complex Vitamins (VITAMIN B COMPLEX) TABS Take 1 tablet by mouth daily.    . cholecalciferol (VITAMIN D3) 25 MCG (1000 UNIT) tablet Take 1,000 Units by mouth daily.    . colestipol (COLESTID) 1 g tablet Take 1 g by mouth 2 (two) times daily.    [name] donepezil (ARICEPT) 10 MG tablet Take 10 mg by mouth at bedtime.    [name] ELIQUIS 2.5 MG TABS tablet Take 2.5 mg by mouth 2 (two) times daily.    [name] ipratropium (ATROVENT) 0.06 % nasal spray Place 2 sprays into both nostrils 2 (two) times daily as needed for congestion.    [name]  K Phos Mono-Sod Phos Di & Mono (VIRT-PHOS 250 NEUTRAL PO) Take 500 mg by mouth in the morning and at bedtime.    [name] liver oil-zinc oxide (DESITIN) 40 % ointment Apply 1 application topically at bedtime. Apply to coccyx/sacrum area    . loperamide (IMODIUM) 2 MG capsule Take 2 mg by mouth 3 (three) times daily.    [name] loperamide (IMODIUM) 2 MG capsule Take 2 mg by mouth 4 (four) times daily as needed for diarrhea or loose stools.    . metoprolol succinate (TOPROL-XL) 25 MG 24 hr tablet Take 25 mg by mouth at bedtime.    [name] MYRBETRIQ 50 MG TB24 tablet Take 50 mg by mouth daily.    . NONFORMULARY OR COMPOUNDED ITEM Apply 1 application topically at bedtime. Apply to affected area(s)  TAC 0.1% lotion/eucerin 1:1    . Olopatadine HCl 0.2 % SOLN Place 1 drop into both eyes daily. Itching/allergy    . Polyethyl Glycol-Propyl Glycol (SYSTANE)  0.4-0.3 % GEL ophthalmic gel Place 1 application into both eyes at bedtime.    . potassium chloride (KLOR-CON) 10 MEQ tablet Take 10 mEq by mouth daily.    . Probiotic Product (FLORAJEN DIGESTION PO) Take 1 capsule by mouth daily.    . Psyllium (FIBER) 28.3 % POWD Take 30 mLs by mouth in the morning and at bedtime. Mix with suitable liquid    . torsemide (DEMADEX) 20 MG tablet Take 20 mg by mouth daily.      Allergies as of 12/21/2019  . (No Known Allergies)    No family history on file.  Social History   Socioeconomic History  . Marital status: Single    Spouse name: Not on file  . Number of children: Not on file  . Years of education: Not on file  . Highest education level: Not on file  Occupational History  . Not on file  Tobacco Use  . Smoking status: Not on file  Substance and Sexual Activity  . Alcohol use: Not on file  . Drug use: Not on file  . Sexual activity: Not on file  Other Topics Concern  . Not on file  Social History Narrative  . Not on file   Social Determinants of Health   Financial Resource Strain:   . Difficulty of Paying Living Expenses: Not on file  Food Insecurity:   . Worried About Charity fundraiser in the Last Year: Not on file  . Ran Out of Food in the Last Year: Not on file  Transportation Needs:   . Lack of Transportation (Medical): Not on file  . Lack of Transportation (Non-Medical): Not on file  Physical Activity:   . Days of Exercise per Week: Not on file  . Minutes of Exercise per Session: Not on file  Stress:   . Feeling of Stress : Not on file  Social Connections:   . Frequency of Communication with Friends and Family: Not on file  . Frequency of Social Gatherings with Friends and Family: Not on file  . Attends Religious Services: Not on file  . Active Member of Clubs or Organizations: Not on file  . Attends Archivist Meetings: Not on file  . Marital Status: Not on file  Intimate Partner Violence:   . Fear of  Current or Ex-Partner: Not on file  . Emotionally Abused: Not on file  . Physically Abused: Not on file  . Sexually Abused: Not on file    Review of Systems:  All systems reviewed and negative except where noted in HPI.  PREVIOUS ENDOSCOPIC STUDIES / IMAGING:      OBJECTIVE:    Physical Exam: Vital signs in last 24 hours: Temp:  [98.1 F (36.7 C)] 98.1 F (36.7 C) (09/10 0920) Pulse Rate:  [55-67] 55 (09/10 0945) Resp:  [18-31] 31 (09/10 0945) BP: (121-128)/(55-68) 121/55 (09/10 0945) SpO2:  [80 %-99 %] 99 % (09/10 0945) Weight:  [59 kg] 59 kg (09/10 0920)   General:   Alert, relatively well-developed male in NAD Psych:  Pleasant, cooperative. Normal mood and affect. Eyes:  Pupils equal, sclera clear, no icterus.   Conjunctiva pink. Ears:  Normal auditory acuity. Nose:  No deformity, discharge,  or lesions. Neck:  Supple; no masses Lungs:  Normal pulmonary effort. On 2 L per [place]. No wheezing. [name]  Heart:  Regular rate , irreg rhythm; loud murmur, no lower extremity edema Abdomen:  Soft, non-distended, nontender, BS active, no palp mass   Rectal:  Deferred  Msk:  Symmetrical without gross deformities. . Neurologic:  Alert and  oriented x4;  grossly normal neurologically. Skin:  Intact without significant lesions or rashes. Scattered bruising on arms.   Filed Weights   12/21/19 0920  Weight: 59 kg     Scheduled inpatient medications . [START ON 12/22/2019] acidophilus  1 capsule Oral Daily  . apixaban  2.5 mg Oral BID  . [START ON 12/22/2019] B-complex with vitamin C  1 tablet Oral Daily  . [START ON 12/22/2019] cholecalciferol  1,000 Units Oral Daily  . colestipol  1 g Oral BID  . donepezil  10 mg Oral QHS  . ipratropium  2 spray Each Nare TID  . loperamide  2 mg Oral TID  . metoprolol succinate  25 mg Oral QHS  . [START ON 12/22/2019] mirabegron ER  50 mg Oral Daily  . olopatadine  1 drop Both Eyes BID  . polyvinyl alcohol  1 drop Both Eyes QHS  . [START ON  12/22/2019] potassium chloride  10 mEq Oral Daily  . [START ON 12/22/2019] psyllium  1 packet Oral Daily  . sodium chloride flush  3 mL Intravenous Q12H  . [START ON 12/22/2019] torsemide  20 mg Oral Daily      Intake/Output from previous day: No intake/output data recorded. Intake/Output this shift: Total I/O In: 50 [IV Piggyback:50] Out: -    Lab Results: Recent Labs    12/21/19 0939 12/21/19 0945  WBC 13.8*  --   HGB 11.5* 12.6*  HCT 39.2 37.0*  PLT 210  --    BMET Recent Labs    12/21/19 0939 12/21/19 0945  NA 143 144  K 3.7 3.7  CL 105 105  CO2 25  --   GLUCOSE 111* 108*  BUN 26* 28*  CREATININE 1.59* 1.50*  CALCIUM 9.2  --    LFT Recent Labs    12/21/19 0939  PROT 6.0*  ALBUMIN 3.4*  AST 29  ALT 21  ALKPHOS 105  BILITOT 1.3*   PT/INR Recent Labs    12/21/19 0939  LABPROT 17.6*  INR 1.5*   Hepatitis Panel No results for input(s): HEPBSAG, HCVAB, HEPAIGM, HEPBIGM in the last 72 hours.   . CBC Latest Ref Rng & Units 12/21/2019 12/21/2019  WBC 4.0 - 10.5 K/uL - 13.8(H)  Hemoglobin 13.0 - 17.0 g/dL 12.6(L) 11.5(L)  Hematocrit 39 - 52 % 37.0(L) 39.2  Platelets 150 - 400 K/uL - 210    . CMP Latest Ref Rng &  Units 12/21/2019 12/21/2019  Glucose 70 - 99 mg/dL 108(H) 111(H)  BUN 8 - 23 mg/dL 28(H) 26(H)  Creatinine 0.61 - 1.24 mg/dL 1.50(H) 1.59(H)  Sodium 135 - 145 mmol/L 144 143  Potassium 3.5 - 5.1 mmol/L 3.7 3.7  Chloride 98 - 111 mmol/L 105 105  CO2 22 - 32 mmol/L - 25  Calcium 8.9 - 10.3 mg/dL - 9.2  Total Protein 6.5 - 8.1 g/dL - 6.0(L)  Total Bilirubin 0.3 - 1.2 mg/dL - 1.3(H)  Alkaline Phos 38 - 126 U/L - 105  AST 15 - 41 U/L - 29  ALT 0 - 44 U/L - 21   Studies/Results: DG Elbow Complete Left  Result Date: 12/21/2019 CLINICAL DATA:  Fall with skin avulsion.  Elbow pain EXAM: LEFT ELBOW - COMPLETE 3+ VIEW COMPARISON:  None. FINDINGS: There is no evidence of fracture, dislocation, or joint effusion. Generalized osteopenia. No  imbedded opaque foreign body suspected. IMPRESSION: Negative for fracture. Electronically Signed   By: Monte Fantasia M.D.   On: 12/21/2019 10:48   CT HEAD WO CONTRAST  Result Date: 12/21/2019 CLINICAL DATA:  Neck trauma EXAM: CT HEAD WITHOUT CONTRAST CT CERVICAL SPINE WITHOUT CONTRAST TECHNIQUE: Multidetector CT imaging of the head and cervical spine was performed following the standard protocol without intravenous contrast. Multiplanar CT image reconstructions of the cervical spine were also generated. COMPARISON:  March 25, 2019 FINDINGS: CT HEAD FINDINGS Brain: No evidence of acute infarction, hemorrhage, hydrocephalus, extra-axial collection or mass lesion/mass effect. Signs of chronic microvascular ischemic change and atrophy as before. Vascular: No hyperdense vessel or unexpected calcification. Skull: Normal. Negative for fracture or focal lesion. Sinuses/Orbits: Mucous retention cysts or polyps in the LEFT maxillary and LEFT ethmoid sinuses unchanged from previous imaging. Post bilateral maxillary antrostomy. Other: None. CT CERVICAL SPINE FINDINGS Alignment: Reversal of normal cervical lordosis in the upper and mid cervical spine. This is seen on a background of multilevel degenerative change. Calcified disc material at C2-3 extends posteriorly. The alignment is unchanged compared to a radiograph from 2017 with mild, approximately 2 mm anterolisthesis of C3 on C4. Skull base and vertebrae: No acute fracture. No primary bone lesion or focal pathologic process. Soft tissues and spinal canal: No prevertebral fluid or swelling. No visible canal hematoma. Disc levels: Multilevel spinal degenerative changes with mild anterolisthesis of C3 on C4. Calcified area extending posteriorly from the disc space likely calcified disc material with mild narrowing of the canal. Alignment unchanged from previous exam, plain film evaluation of 2017. There is also mild anterolisthesis of C4 on C5. Multilevel disc space  narrowing greatest at C4-5, C5-6, C6-7 and C7-T1. Facet arthropathy greatest at C3-4 and C4-5. Upper chest: Emphysematous changes and patchy bilateral areas of airspace disease with nodular component. Loculated pleural fluid in the LEFT chest partially visualized. Marked patulous appearance of the esophagus. Other: None IMPRESSION: 1. No acute intracranial abnormality. 2. No evidence for acute fracture or traumatic malalignment in the cervical spine. 3. Multilevel spinal degenerative changes of the cervical spine as described. 4. Emphysematous changes and patchy bilateral areas of airspace disease with nodular component. Findings likely reflect multifocal pneumonia and/or aspiration related changes, see dedicated CT of the chest. 5. Loculated pleural fluid may be due to above findings. 6. Marked patulous appearance of the esophagus, configuration could be seen in the setting of achalasia and could contribute to aspiration risk in this patient who is shown to aspirate on previous barium evaluation. 7. Emphysema. Emphysema (ICD10-J43.9). Electronically Signed   By:  Zetta Bills M.D.   On: 12/21/2019 11:24   CT Cervical Spine Wo Contrast  Result Date: 12/21/2019 CLINICAL DATA:  Neck trauma EXAM: CT HEAD WITHOUT CONTRAST CT CERVICAL SPINE WITHOUT CONTRAST TECHNIQUE: Multidetector CT imaging of the head and cervical spine was performed following the standard protocol without intravenous contrast. Multiplanar CT image reconstructions of the cervical spine were also generated. COMPARISON:  March 25, 2019 FINDINGS: CT HEAD FINDINGS Brain: No evidence of acute infarction, hemorrhage, hydrocephalus, extra-axial collection or mass lesion/mass effect. Signs of chronic microvascular ischemic change and atrophy as before. Vascular: No hyperdense vessel or unexpected calcification. Skull: Normal. Negative for fracture or focal lesion. Sinuses/Orbits: Mucous retention cysts or polyps in the LEFT maxillary and LEFT ethmoid  sinuses unchanged from previous imaging. Post bilateral maxillary antrostomy. Other: None. CT CERVICAL SPINE FINDINGS Alignment: Reversal of normal cervical lordosis in the upper and mid cervical spine. This is seen on a background of multilevel degenerative change. Calcified disc material at C2-3 extends posteriorly. The alignment is unchanged compared to a radiograph from 2017 with mild, approximately 2 mm anterolisthesis of C3 on C4. Skull base and vertebrae: No acute fracture. No primary bone lesion or focal pathologic process. Soft tissues and spinal canal: No prevertebral fluid or swelling. No visible canal hematoma. Disc levels: Multilevel spinal degenerative changes with mild anterolisthesis of C3 on C4. Calcified area extending posteriorly from the disc space likely calcified disc material with mild narrowing of the canal. Alignment unchanged from previous exam, plain film evaluation of 2017. There is also mild anterolisthesis of C4 on C5. Multilevel disc space narrowing greatest at C4-5, C5-6, C6-7 and C7-T1. Facet arthropathy greatest at C3-4 and C4-5. Upper chest: Emphysematous changes and patchy bilateral areas of airspace disease with nodular component. Loculated pleural fluid in the LEFT chest partially visualized. Marked patulous appearance of the esophagus. Other: None IMPRESSION: 1. No acute intracranial abnormality. 2. No evidence for acute fracture or traumatic malalignment in the cervical spine. 3. Multilevel spinal degenerative changes of the cervical spine as described. 4. Emphysematous changes and patchy bilateral areas of airspace disease with nodular component. Findings likely reflect multifocal pneumonia and/or aspiration related changes, see dedicated CT of the chest. 5. Loculated pleural fluid may be due to above findings. 6. Marked patulous appearance of the esophagus, configuration could be seen in the setting of achalasia and could contribute to aspiration risk in this patient who is  shown to aspirate on previous barium evaluation. 7. Emphysema. Emphysema (ICD10-J43.9). Electronically Signed   By: Zetta Bills M.D.   On: 12/21/2019 11:24   DG Pelvis Portable  Result Date: 12/21/2019 CLINICAL DATA:  Ground level fall EXAM: PORTABLE PELVIS 1-2 VIEWS COMPARISON:  05/03/2019 FINDINGS: Diffuse osseous demineralization. Partially visualized bilateral hip arthroplasties. No evidence of acute fracture, dislocation, or pelvic diastasis. IMPRESSION: Negative. Electronically Signed   By: Davina Poke D.O.   On: 12/21/2019 10:00   CT CHEST ABDOMEN PELVIS W CONTRAST  Result Date: 12/21/2019 CLINICAL DATA:  86 year old male with history of trauma from a fall. Abnormal chest x-ray. EXAM: CT CHEST, ABDOMEN, AND PELVIS WITH CONTRAST TECHNIQUE: Multidetector CT imaging of the chest, abdomen and pelvis was performed following the standard protocol during bolus administration of intravenous contrast. CONTRAST:  137mL OMNIPAQUE IOHEXOL 300 MG/ML  SOLN COMPARISON:  CT the abdomen and pelvis 02/21/2019. No prior chest CT. FINDINGS: CT CHEST FINDINGS Cardiovascular: Heart size is severely enlarged with right ventricular and severe right atrial dilatation. There is no significant pericardial fluid,  thickening or pericardial calcification. There is aortic atherosclerosis, as well as atherosclerosis of the great vessels of the mediastinum and the coronary arteries, including calcified atherosclerotic plaque in the left anterior descending and left circumflex coronary arteries. Thickening calcification of the aortic valve. Mediastinum/Nodes: No pathologically enlarged mediastinal or hilar lymph nodes. Severely dilated esophagus. No axillary lymphadenopathy. Lungs/Pleura: No pneumothorax. Patchy areas of ground-glass attenuation and airspace consolidation are scattered throughout the lungs bilaterally (right greater than left), most confluent in the right middle lobe and right lower lobe, compatible with  multilobar pneumonia. Small bilateral pleural effusions, some of which is partially loculated within the major fissures bilaterally. Musculoskeletal: Old healed fracture of the lateral aspect of the left ninth rib. There are no acute displaced fractures or aggressive appearing lytic or blastic lesions noted in the visualized portions of the skeleton. CT ABDOMEN PELVIS FINDINGS Hepatobiliary: No suspicious cystic or solid hepatic lesions. No intra or extrahepatic biliary ductal dilatation. Gallbladder is normal in appearance. Pancreas: No pancreatic mass. No pancreatic ductal dilatation. No pancreatic or peripancreatic fluid collections or inflammatory changes. Spleen: Unremarkable. Adrenals/Urinary Tract: Bilateral kidneys and bilateral adrenal glands are normal in appearance. No hydroureteronephrosis. Urinary bladder is nearly completely obscured by beam hardening artifact from the patient's bilateral hip arthroplasties. Stomach/Bowel: Stomach is completely decompressed, but otherwise unremarkable. No pathologic dilatation of small bowel or colon. Numerous colonic diverticulae are noted, without surrounding inflammatory changes to suggest an acute diverticulitis at this time. The appendix is not confidently identified and may be surgically absent. Regardless, there are no inflammatory changes noted adjacent to the cecum to suggest the presence of an acute appendicitis at this time. Vascular/Lymphatic: Aortic atherosclerosis, without evidence of aneurysm or dissection in the abdominal or pelvic vasculature. No lymphadenopathy noted in the abdomen or pelvis. Reproductive: Status post prostatectomy. At the base of the penis there is a large amount of serpiginous high attenuation material, similar to prior study from 02/21/2019, presumably related to prior prostatectomy. Other: No significant volume of ascites.  No pneumoperitoneum. Musculoskeletal: Status post bilateral hip arthroplasty. There are no acute displaced  fractures aggressive appearing lytic or blastic lesions noted in the visualized portions of the skeleton. IMPRESSION: 1. No definitive evidence of significant acute traumatic injury to the chest, abdomen or pelvis. 2. The appearance of the lungs is highly concerning for multilobar bilateral pneumonia, as above. There are small bilateral partially loculated pleural effusions. 3. Cardiomegaly with right ventricular and right atrial dilatation. 4. Aortic atherosclerosis, in addition to 2 vessel coronary artery disease. 5. There are calcifications of the aortic valve. Echocardiographic correlation for evaluation of potential valvular dysfunction may be warranted if clinically indicated. 6. Colonic diverticulosis without evidence of acute diverticulitis at this time. 7. Additional incidental findings, as above. Electronically Signed   By: Vinnie Langton M.D.   On: 12/21/2019 11:22   DG Chest Port 1 View  Result Date: 12/21/2019 CLINICAL DATA:  Fall. EXAM: PORTABLE CHEST 1 VIEW COMPARISON:  04/30/2019. FINDINGS: Mediastinum and hilar structures normal. Prominent cardiomegaly again noted. Prominent bibasilar pulmonary infiltrates/edema noted on today's exam. Small bilateral pleural effusions noted. Biapical pleural thickening consistent scarring. What appears to represent a skin fold on the left is noted on 1-2 views obtained. Lung markings are noted outside of this apparent skin fold. This finding is not noted on the second image obtained. To completely ensure the absence of a pneumothorax repeat PA chest x-ray suggested. IMPRESSION: 1. What appears to represent a skin fold on the left is noted on  1 of the two views obtained. To completely ensure the absence of a small pneumothorax repeat PA chest x-ray suggested. 2. Prominent bibasilar pulmonary infiltrates/edema noted on today's exam. 3.  Severe cardiomegaly again noted. Critical Value/emergent results were called by telephone at the time of interpretation on  12/21/2019 at 10:12 am to provider [place] , who verbally acknowledged these results. Electronically Signed   By: Marcello Moores  Register   On: 12/21/2019 10:10   DG Knee Complete 4 Views Left  Result Date: 12/21/2019 CLINICAL DATA:  Fall. EXAM: LEFT KNEE - COMPLETE 4+ VIEW COMPARISON:  03/25/2019. FINDINGS: Bandage noted anteriorly. No evidence of effusion. Diffuse osteopenia. Mild patellofemoral degenerative change. No evidence of fracture dislocation. Peripheral vascular calcification. IMPRESSION: 1. Diffuse osteopenia. Mild degenerative change. No acute bony or joint abnormality. 2.  Peripheral vascular disease. Electronically Signed   By: Marcello Moores  Register   On: 12/21/2019 10:49    Active Problems:   Fall at home, initial encounter   Failure to thrive in adult    Tye Savoy, NP-C @  12/21/2019, 2:33 PM
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
## 2019-12-21 NOTE — ED Notes (Signed)
Pt unable to swallow Colestid PO med even after splitting pill into smaller pieces. Med unable to be given.
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
## 2019-12-21 NOTE — Progress Notes (Addendum)
Pharmacy Antibiotic Note  Jeremy Kelley is a 86 y.o. male admitted on 12/21/2019 with possible aspiration pneumonia after a fall with AMS. Pharmacy has been consulted for ampicillin-sulbactam dosing.  Plan: Ampicillin-sulbactam 3g IV every 12 hours Follow up with cultures, antibiotic de-escalation and LOT Monitor renal function and clinical progress  Height: 5\' 11"  (180.3 cm) Weight: 59 kg (130 lb) IBW/kg (Calculated) : 75.3  Temp (24hrs), Avg:98.1 F (36.7 C), Min:98.1 F (36.7 C), Max:98.1 F (36.7 C)  Recent Labs  Lab 12/21/19 0939 12/21/19 0945 12/21/19 1000  WBC 13.8*  --   --   CREATININE 1.59* 1.50*  --   LATICACIDVEN  --   --  3.0*    Estimated Creatinine Clearance: 27.3 mL/min (A) (by C-G formula based on SCr of 1.5 mg/dL (H)).    No Known Allergies  Antimicrobials this admission: 9/10 ampicillin-sulbactam >>   Microbiology results: 9/10 BCx: pending  Thank you for allowing pharmacy to be a part of this patient's care.  Mercy Riding, PharmD PGY1 Acute Care Pharmacy Resident Please refer to [facility] for unit-specific pharmacist

## 2019-12-21 NOTE — ED Notes (Signed)
Pt transported to CT ?

## 2019-12-21 NOTE — ED Notes (Signed)
Echo at bedside

## 2019-12-21 NOTE — ED Triage Notes (Signed)
Pt bib EMS fell while sitting on the toilet. Pt is on eliquis.. Skin tears to left elbow and knee. Lacerations on bottom of right foot and right cheek. Pt A&O x4 and able to recount situation.   BP: 124/76 HR: 84 O2: 99 CBG: 147

## 2019-12-21 NOTE — H&P (Signed)
History and Physical    Jeremy Kelley WJX:[id number] DOB: [birth year] DOA: 12/21/2019  PCP: [name], NP (Confirm with patient/family/NH records and if not entered, this has to be entered at [facility] point of entry) Patient coming from: Assisted living  I have personally briefly reviewed patient's old medical records in [place]  Chief Complaint: I fell  HPI: Jeremy Kelley is a 86 y.o. male with medical history significant of Alzheimer dementia, chronic dysphagia, failure to thrive, A. fib on Eliquis, presented with syncope and fall.  Patient is somewhat confused most of the history provided by patient's POA at bedside and over the phone.  Patient medical POA Jeremy Kelley reported the patient's overall condition has deteriorated since last year, has been followed by palliative care, but formal hospice has not been offered.  Baseline, patient has chronic dysphagia on dysphagia diet, despite has been having poor intake and been losing weight now BMI is 18.  Patient had multiple episodes of falls since November last year, in January this year he broke hip and had hip surgery.  2 days ago patient had a syncope episode, woke up but frustrated " why did not you let me die" POA explained to him that he is not dying, but pt refused to come to hospital that day. Today pt had a fall on toilet seat, he remembered that he tried to stand up then might have passed out, but he insisted that the loss of consciousness was very brief, he fell to the left side and there is some skin on the left elbow and left knee and laceration to the bottom of the right foot and right cheek.  ED Course: Oxygen saturation was found in the 80s, trauma survey showed no acute fracture or dislocation.  CT chest showed achalasia and multifocal pneumonia new plus old, right more than left, likely aspiration.  Review of Systems: As per HPI otherwise 14 point review of systems negative.    No past medical history on file.     has  no history on file for tobacco use, alcohol use, and drug use.  No Known Allergies  No family history on file.   Prior to Admission medications   Medication Sig Start Date End Date Taking? Authorizing Provider  acetaminophen (TYLENOL) 325 MG tablet Take 650 mg by mouth every 6 (six) hours as needed for mild pain.   Yes [provider]  B Complex Vitamins (VITAMIN B COMPLEX) TABS Take 1 tablet by mouth daily.   Yes [provider]  cholecalciferol (VITAMIN D3) 25 MCG (1000 UNIT) tablet Take 1,000 Units by mouth daily.   Yes [provider]  colestipol (COLESTID) 1 g tablet Take 1 g by mouth 2 (two) times daily. 12/11/19  Yes [provider]  donepezil (ARICEPT) 10 MG tablet Take 10 mg by mouth at bedtime. 12/12/19  Yes [provider]  ELIQUIS 2.5 MG TABS tablet Take 2.5 mg by mouth 2 (two) times daily. 12/19/19  Yes [provider]  ipratropium (ATROVENT) 0.06 % nasal spray Place 2 sprays into both nostrils 2 (two) times daily as needed for congestion. 11/13/19  Yes [provider]  K Phos Mono-Sod Phos Di & Mono (VIRT-PHOS 250 NEUTRAL PO) Take 500 mg by mouth in the morning and at bedtime.   Yes [provider]  liver oil-zinc oxide (DESITIN) 40 % ointment Apply 1 application topically at bedtime. Apply to coccyx/sacrum area   Yes [provider]  loperamide (IMODIUM) 2  MG capsule Take 2 mg by mouth 3 (three) times daily. 12/12/19  Yes [provider]  loperamide (IMODIUM) 2 MG capsule Take 2 mg by mouth 4 (four) times daily as needed for diarrhea or loose stools.   Yes [provider]  metoprolol succinate (TOPROL-XL) 25 MG 24 hr tablet Take 25 mg by mouth at bedtime. 12/19/19  Yes [provider]  MYRBETRIQ 50 MG TB24 tablet Take 50 mg by mouth daily. 11/27/19  Yes [provider]  NONFORMULARY OR COMPOUNDED ITEM Apply 1 application topically at bedtime. Apply to affected area(s)  TAC  0.1% lotion/eucerin 1:1   Yes [provider]  Olopatadine HCl 0.2 % SOLN Place 1 drop into both eyes daily. Itching/allergy   Yes [provider]  Polyethyl Glycol-Propyl Glycol (SYSTANE) 0.4-0.3 % GEL ophthalmic gel Place 1 application into both eyes at bedtime.   Yes [provider]  potassium chloride (KLOR-CON) 10 MEQ tablet Take 10 mEq by mouth daily.   Yes [provider]  Probiotic Product (FLORAJEN DIGESTION PO) Take 1 capsule by mouth daily.   Yes [provider]  Psyllium (FIBER) 28.3 % POWD Take 30 mLs by mouth in the morning and at bedtime. Mix with suitable liquid   Yes [provider]  torsemide (DEMADEX) 20 MG tablet Take 20 mg by mouth daily. 12/19/19  Yes [provider]    Physical Exam: Vitals:   12/21/19 0924 12/21/19 0926 12/21/19 0930 12/21/19 0945  BP:   127/68 (!) 121/55  Pulse:   66 (!) 55  Resp:   19 (!) 31  Temp:      TempSrc:      SpO2: 90% 99% 95% 99%  Weight:      Height:        Constitutional: NAD, calm, comfortable Vitals:   12/21/19 0924 12/21/19 0926 12/21/19 0930 12/21/19 0945  BP:   127/68 (!) 121/55  Pulse:   66 (!) 55  Resp:   19 (!) 31  Temp:      TempSrc:      SpO2: 90% 99% 95% 99%  Weight:      Height:       Eyes: PERRL, lids and conjunctivae normal ENMT: Mucous membranes are dry. Posterior pharynx clear of any exudate or lesions.Normal dentition.  Neck: normal, supple, no masses, no thyromegaly Respiratory: clear to auscultation bilaterally, no wheezing, no crackles. Normal respiratory effort. No accessory muscle use.  Cardiovascular: Regular rate and rhythm, no murmurs / rubs / gallops. No extremity edema. 2+ pedal pulses. No carotid bruits.  Abdomen: no tenderness, no masses palpated. No hepatosplenomegaly. Bowel sounds positive.  Musculoskeletal: no clubbing / cyanosis. No joint deformity upper and lower extremities. Good ROM, no contractures. Normal muscle tone.  Skin:  Bruises and tears on left elbow left knee Neurologic: CN 2-12 grossly intact. Sensation intact, DTR normal. Strength 5/5 in all 4.  Psychiatric: Normal judgment and insight. Alert and oriented x 3. Normal mood.     Labs on Admission: I have personally reviewed following labs and imaging studies  CBC: Recent Labs  Lab 12/21/19 0939 12/21/19 0945  WBC 13.8*  --   HGB 11.5* 12.6*  HCT 39.2 37.0*  MCV 95.8  --   PLT 210  --    Basic Metabolic Panel: Recent Labs  Lab 12/21/19 0939 12/21/19 0945  NA 143 144  K 3.7 3.7  CL 105 105  CO2 25  --   GLUCOSE 111* 108*  BUN  26* 28*  CREATININE 1.59* 1.50*  CALCIUM 9.2  --    GFR: Estimated Creatinine Clearance: 27.3 mL/min (A) (by C-G formula based on SCr of 1.5 mg/dL (H)). Liver Function Tests: Recent Labs  Lab 12/21/19 0939  AST 29  ALT 21  ALKPHOS 105  BILITOT 1.3*  PROT 6.0*  ALBUMIN 3.4*   No results for input(s): LIPASE, AMYLASE in the last 168 hours. No results for input(s): AMMONIA in the last 168 hours. Coagulation Profile: Recent Labs  Lab 12/21/19 0939  INR 1.5*   Cardiac Enzymes: No results for input(s): CKTOTAL, CKMB, CKMBINDEX, TROPONINI in the last 168 hours. BNP (last 3 results) No results for input(s): PROBNP in the last 8760 hours. HbA1C: No results for input(s): HGBA1C in the last 72 hours. CBG: Recent Labs  Lab 12/21/19 0923  GLUCAP 102*   Lipid Profile: No results for input(s): CHOL, HDL, LDLCALC, TRIG, CHOLHDL, LDLDIRECT in the last 72 hours. Thyroid Function Tests: No results for input(s): TSH, T4TOTAL, FREET4, T3FREE, THYROIDAB in the last 72 hours. Anemia Panel: No results for input(s): VITAMINB12, FOLATE, FERRITIN, TIBC, IRON, RETICCTPCT in the last 72 hours. Urine analysis: No results found for: COLORURINE, APPEARANCEUR, [name], [place], GLUCOSEU, HGBUR, BILIRUBINUR, KETONESUR, PROTEINUR, UROBILINOGEN, NITRITE, LEUKOCYTESUR  Radiological Exams on Admission: DG Elbow Complete  Left  Result Date: 12/21/2019 CLINICAL DATA:  Fall with skin avulsion.  Elbow pain EXAM: LEFT ELBOW - COMPLETE 3+ VIEW COMPARISON:  None. FINDINGS: There is no evidence of fracture, dislocation, or joint effusion. Generalized osteopenia. No imbedded opaque foreign body suspected. IMPRESSION: Negative for fracture. Electronically Signed   By: Monte Fantasia M.D.   On: 12/21/2019 10:48   CT HEAD WO CONTRAST  Result Date: 12/21/2019 CLINICAL DATA:  Neck trauma EXAM: CT HEAD WITHOUT CONTRAST CT CERVICAL SPINE WITHOUT CONTRAST TECHNIQUE: Multidetector CT imaging of the head and cervical spine was performed following the standard protocol without intravenous contrast. Multiplanar CT image reconstructions of the cervical spine were also generated. COMPARISON:  March 25, 2019 FINDINGS: CT HEAD FINDINGS Brain: No evidence of acute infarction, hemorrhage, hydrocephalus, extra-axial collection or mass lesion/mass effect. Signs of chronic microvascular ischemic change and atrophy as before. Vascular: No hyperdense vessel or unexpected calcification. Skull: Normal. Negative for fracture or focal lesion. Sinuses/Orbits: Mucous retention cysts or polyps in the LEFT maxillary and LEFT ethmoid sinuses unchanged from previous imaging. Post bilateral maxillary antrostomy. Other: None. CT CERVICAL SPINE FINDINGS Alignment: Reversal of normal cervical lordosis in the upper and mid cervical spine. This is seen on a background of multilevel degenerative change. Calcified disc material at C2-3 extends posteriorly. The alignment is unchanged compared to a radiograph from 2017 with mild, approximately 2 mm anterolisthesis of C3 on C4. Skull base and vertebrae: No acute fracture. No primary bone lesion or focal pathologic process. Soft tissues and spinal canal: No prevertebral fluid or swelling. No visible canal hematoma. Disc levels: Multilevel spinal degenerative changes with mild anterolisthesis of C3 on C4. Calcified area  extending posteriorly from the disc space likely calcified disc material with mild narrowing of the canal. Alignment unchanged from previous exam, plain film evaluation of 2017. There is also mild anterolisthesis of C4 on C5. Multilevel disc space narrowing greatest at C4-5, C5-6, C6-7 and C7-T1. Facet arthropathy greatest at C3-4 and C4-5. Upper chest: Emphysematous changes and patchy bilateral areas of airspace disease with nodular component. Loculated pleural fluid in the LEFT chest partially visualized. Marked patulous appearance of the esophagus. Other: None IMPRESSION: 1. No acute  intracranial abnormality. 2. No evidence for acute fracture or traumatic malalignment in the cervical spine. 3. Multilevel spinal degenerative changes of the cervical spine as described. 4. Emphysematous changes and patchy bilateral areas of airspace disease with nodular component. Findings likely reflect multifocal pneumonia and/or aspiration related changes, see dedicated CT of the chest. 5. Loculated pleural fluid may be due to above findings. 6. Marked patulous appearance of the esophagus, configuration could be seen in the setting of achalasia and could contribute to aspiration risk in this patient who is shown to aspirate on previous barium evaluation. 7. Emphysema. Emphysema (ICD10-J43.9). Electronically Signed   By: Zetta Bills M.D.   On: 12/21/2019 11:24   CT Cervical Spine Wo Contrast  Result Date: 12/21/2019 CLINICAL DATA:  Neck trauma EXAM: CT HEAD WITHOUT CONTRAST CT CERVICAL SPINE WITHOUT CONTRAST TECHNIQUE: Multidetector CT imaging of the head and cervical spine was performed following the standard protocol without intravenous contrast. Multiplanar CT image reconstructions of the cervical spine were also generated. COMPARISON:  March 25, 2019 FINDINGS: CT HEAD FINDINGS Brain: No evidence of acute infarction, hemorrhage, hydrocephalus, extra-axial collection or mass lesion/mass effect. Signs of chronic  microvascular ischemic change and atrophy as before. Vascular: No hyperdense vessel or unexpected calcification. Skull: Normal. Negative for fracture or focal lesion. Sinuses/Orbits: Mucous retention cysts or polyps in the LEFT maxillary and LEFT ethmoid sinuses unchanged from previous imaging. Post bilateral maxillary antrostomy. Other: None. CT CERVICAL SPINE FINDINGS Alignment: Reversal of normal cervical lordosis in the upper and mid cervical spine. This is seen on a background of multilevel degenerative change. Calcified disc material at C2-3 extends posteriorly. The alignment is unchanged compared to a radiograph from 2017 with mild, approximately 2 mm anterolisthesis of C3 on C4. Skull base and vertebrae: No acute fracture. No primary bone lesion or focal pathologic process. Soft tissues and spinal canal: No prevertebral fluid or swelling. No visible canal hematoma. Disc levels: Multilevel spinal degenerative changes with mild anterolisthesis of C3 on C4. Calcified area extending posteriorly from the disc space likely calcified disc material with mild narrowing of the canal. Alignment unchanged from previous exam, plain film evaluation of 2017. There is also mild anterolisthesis of C4 on C5. Multilevel disc space narrowing greatest at C4-5, C5-6, C6-7 and C7-T1. Facet arthropathy greatest at C3-4 and C4-5. Upper chest: Emphysematous changes and patchy bilateral areas of airspace disease with nodular component. Loculated pleural fluid in the LEFT chest partially visualized. Marked patulous appearance of the esophagus. Other: None IMPRESSION: 1. No acute intracranial abnormality. 2. No evidence for acute fracture or traumatic malalignment in the cervical spine. 3. Multilevel spinal degenerative changes of the cervical spine as described. 4. Emphysematous changes and patchy bilateral areas of airspace disease with nodular component. Findings likely reflect multifocal pneumonia and/or aspiration related changes,  see dedicated CT of the chest. 5. Loculated pleural fluid may be due to above findings. 6. Marked patulous appearance of the esophagus, configuration could be seen in the setting of achalasia and could contribute to aspiration risk in this patient who is shown to aspirate on previous barium evaluation. 7. Emphysema. Emphysema (ICD10-J43.9). Electronically Signed   By: Zetta Bills M.D.   On: 12/21/2019 11:24   DG Pelvis Portable  Result Date: 12/21/2019 CLINICAL DATA:  Ground level fall EXAM: PORTABLE PELVIS 1-2 VIEWS COMPARISON:  05/03/2019 FINDINGS: Diffuse osseous demineralization. Partially visualized bilateral hip arthroplasties. No evidence of acute fracture, dislocation, or pelvic diastasis. IMPRESSION: Negative. Electronically Signed   By: Davina Poke D.O.  On: 12/21/2019 10:00   CT CHEST ABDOMEN PELVIS W CONTRAST  Result Date: 12/21/2019 CLINICAL DATA:  86 year old male with history of trauma from a fall. Abnormal chest x-ray. EXAM: CT CHEST, ABDOMEN, AND PELVIS WITH CONTRAST TECHNIQUE: Multidetector CT imaging of the chest, abdomen and pelvis was performed following the standard protocol during bolus administration of intravenous contrast. CONTRAST:  176mL OMNIPAQUE IOHEXOL 300 MG/ML  SOLN COMPARISON:  CT the abdomen and pelvis 02/21/2019. No prior chest CT. FINDINGS: CT CHEST FINDINGS Cardiovascular: Heart size is severely enlarged with right ventricular and severe right atrial dilatation. There is no significant pericardial fluid, thickening or pericardial calcification. There is aortic atherosclerosis, as well as atherosclerosis of the great vessels of the mediastinum and the coronary arteries, including calcified atherosclerotic plaque in the left anterior descending and left circumflex coronary arteries. Thickening calcification of the aortic valve. Mediastinum/Nodes: No pathologically enlarged mediastinal or hilar lymph nodes. Severely dilated esophagus. No axillary lymphadenopathy.  Lungs/Pleura: No pneumothorax. Patchy areas of ground-glass attenuation and airspace consolidation are scattered throughout the lungs bilaterally (right greater than left), most confluent in the right middle lobe and right lower lobe, compatible with multilobar pneumonia. Small bilateral pleural effusions, some of which is partially loculated within the major fissures bilaterally. Musculoskeletal: Old healed fracture of the lateral aspect of the left ninth rib. There are no acute displaced fractures or aggressive appearing lytic or blastic lesions noted in the visualized portions of the skeleton. CT ABDOMEN PELVIS FINDINGS Hepatobiliary: No suspicious cystic or solid hepatic lesions. No intra or extrahepatic biliary ductal dilatation. Gallbladder is normal in appearance. Pancreas: No pancreatic mass. No pancreatic ductal dilatation. No pancreatic or peripancreatic fluid collections or inflammatory changes. Spleen: Unremarkable. Adrenals/Urinary Tract: Bilateral kidneys and bilateral adrenal glands are normal in appearance. No hydroureteronephrosis. Urinary bladder is nearly completely obscured by beam hardening artifact from the patient's bilateral hip arthroplasties. Stomach/Bowel: Stomach is completely decompressed, but otherwise unremarkable. No pathologic dilatation of small bowel or colon. Numerous colonic diverticulae are noted, without surrounding inflammatory changes to suggest an acute diverticulitis at this time. The appendix is not confidently identified and may be surgically absent. Regardless, there are no inflammatory changes noted adjacent to the cecum to suggest the presence of an acute appendicitis at this time. Vascular/Lymphatic: Aortic atherosclerosis, without evidence of aneurysm or dissection in the abdominal or pelvic vasculature. No lymphadenopathy noted in the abdomen or pelvis. Reproductive: Status post prostatectomy. At the base of the penis there is a large amount of serpiginous high  attenuation material, similar to prior study from 02/21/2019, presumably related to prior prostatectomy. Other: No significant volume of ascites.  No pneumoperitoneum. Musculoskeletal: Status post bilateral hip arthroplasty. There are no acute displaced fractures aggressive appearing lytic or blastic lesions noted in the visualized portions of the skeleton. IMPRESSION: 1. No definitive evidence of significant acute traumatic injury to the chest, abdomen or pelvis. 2. The appearance of the lungs is highly concerning for multilobar bilateral pneumonia, as above. There are small bilateral partially loculated pleural effusions. 3. Cardiomegaly with right ventricular and right atrial dilatation. 4. Aortic atherosclerosis, in addition to 2 vessel coronary artery disease. 5. There are calcifications of the aortic valve. Echocardiographic correlation for evaluation of potential valvular dysfunction may be warranted if clinically indicated. 6. Colonic diverticulosis without evidence of acute diverticulitis at this time. 7. Additional incidental findings, as above. Electronically Signed   By: Vinnie Langton M.D.   On: 12/21/2019 11:22   DG Chest Port 1 View  Result Date:  12/21/2019 CLINICAL DATA:  Fall. EXAM: PORTABLE CHEST 1 VIEW COMPARISON:  04/30/2019. FINDINGS: Mediastinum and hilar structures normal. Prominent cardiomegaly again noted. Prominent bibasilar pulmonary infiltrates/edema noted on today's exam. Small bilateral pleural effusions noted. Biapical pleural thickening consistent scarring. What appears to represent a skin fold on the left is noted on 1-2 views obtained. Lung markings are noted outside of this apparent skin fold. This finding is not noted on the second image obtained. To completely ensure the absence of a pneumothorax repeat PA chest x-ray suggested. IMPRESSION: 1. What appears to represent a skin fold on the left is noted on 1 of the two views obtained. To completely ensure the absence of a  small pneumothorax repeat PA chest x-ray suggested. 2. Prominent bibasilar pulmonary infiltrates/edema noted on today's exam. 3.  Severe cardiomegaly again noted. Critical Value/emergent results were called by telephone at the time of interpretation on 12/21/2019 at 10:12 am to provider Celoron , who verbally acknowledged these results. Electronically Signed   By: Marcello Moores  Register   On: 12/21/2019 10:10   DG Knee Complete 4 Views Left  Result Date: 12/21/2019 CLINICAL DATA:  Fall. EXAM: LEFT KNEE - COMPLETE 4+ VIEW COMPARISON:  03/25/2019. FINDINGS: Bandage noted anteriorly. No evidence of effusion. Diffuse osteopenia. Mild patellofemoral degenerative change. No evidence of fracture dislocation. Peripheral vascular calcification. IMPRESSION: 1. Diffuse osteopenia. Mild degenerative change. No acute bony or joint abnormality. 2.  Peripheral vascular disease. Electronically Signed   By: Marcello Moores  Register   On: 12/21/2019 10:49    EKG: Independently reviewed.  A. fib  Assessment/Plan Active Problems:   Fall at home, initial encounter   Failure to thrive in adult  (please populate well all problems here in Problem List. (For example, if patient is on BP meds at home and you resume or decide to hold them, it is a problem that needs to be her. Same for CAD, COPD, HLD and so on)  Syncope -Suspect there is element of vasovagal, as patient looks dry with AKI.  This probably represent a acute on chronic poor nutrition/dehydration because of the dysphagia.  CT chest today showed evidence of achalasia, discussed with POA Jo over the phone, patient has a living will specified no permanent feeding tube.  Then discussed with [facility] Dr. Rush Landmark, who will come to see the patient this afternoon, initial opinion is might be a candidate for Botox injection but not esophageal dilation. -No evidence of severe arrhythmia so far to explain syncope, telemetry monitoring and order echo -PT OT evaluation and  orthostatic vital signs twice daily -IV fluids for today -N.p.o.  Acute hypoxic respite failure secondary to aspiration pneumonia -Given this CAT scan showed acute infiltrates, will treat aspiration pneumonia -Long-term solution as above  Chronic dysphagia -At least partly because of the achalasia, consult GI as above  Chronic A. Fib -Continue Eliquis for now, may need to evaluate for risk after hydration  Failure to thrive -Severely underweight, BMI 18, as patient POA's request will consult palliative care.  AKI -As above  History of chronic constipation and SBO -He is on multiple bowel regimen  DVT prophylaxis: Eliquis Code Status: DNR Family Communication: [place] Disposition Plan: Expect more than 2 midnight hospital stay to treat UTI and GI evaluation and treatment and speech evaluation Consults called: [facility] Admission status: Tele admit   Lequita Halt MD [facility] Pager [phone]  12/21/2019, 1:01 PM

## 2019-12-21 NOTE — ED Notes (Signed)
Attempted report 

## 2019-12-21 NOTE — ED Notes (Signed)
Date and time results received: 12/21/19 1044 (use smartphrase ".now" to insert current time)  Test: lactic Acid Critical Value: 3.0  Name of Provider Notified: Dr. Ron Parker

## 2019-12-21 NOTE — Progress Notes (Signed)
TOPROL-XL cannot be crushed. Text page on call provider to change order

## 2019-12-21 NOTE — Progress Notes (Signed)
  Echocardiogram 2D Echocardiogram has been performed.  Jeremy Kelley 12/21/2019, 1:57 PM

## 2019-12-21 NOTE — Progress Notes (Signed)
Hold Eliquis for incoming GI procedure after discussed with GI attending.  Start heparin subcu for DVT prophylaxis.

## 2019-12-21 NOTE — Evaluation (Signed)
Clinical/Bedside Swallow Evaluation Patient Details  Name: Jeremy Kelley MRN: [id number] Date of Birth: 23-Jan-1927  Today's Date: 12/21/2019 Time: SLP Start Time (ACUTE ONLY): 1455 SLP Stop Time (ACUTE ONLY): 1520 SLP Time Calculation (min) (ACUTE ONLY): 25 min  Past Medical History: No past medical history on file. Past Surgical History: The histories are not reviewed yet. Please review them in the "History" navigator section and refresh this [place].  HPI: 86year old male admitted 12/21/19 from ALF after a fall and syncope. PMH: Alzheimer's dementia, chronic dysphagia, FTT, AFib, deterioration over the last year, palliative care on board. CTChest - achalasia, multifocal PNA, new plus old, R>L, likely aspiration   Assessment / Plan / Recommendation Clinical Impression   Pt seen at bedside for assessment of swallow function/safety and identification of least restrictive diet. Pt has a long standing history of dysphagia - pt reports globus sensation with solids, and better tolerance of liquids than solids. CN exam unremarkable today. Pt with upper and lower dentures. Pt accepted trials of only thin liquids at this time, stating concern that if solid textures "pulled" his dentures out, he wouldn't be able to get them back in. Pt did not want to risk that happening. Pt tolerated sips of thin liquid (reports he never uses a straw) without overt s/s aspiration. Belching noted intermittently.   Given history of esophageal issues and achalasia per chest CT, recommend full liquid diet until GI procedure can be completed. SLP will follow for diet tolerance, readiness to advance, and continued education.   SLP Visit Diagnosis: Dysphagia, unspecified (R13.10)    Aspiration Risk  Mild aspiration risk;Risk for inadequate nutrition/hydration;Moderate aspiration risk    Diet Recommendation Thin liquid (full liquid)   Liquid Administration via: Cup (pt reports he does not use straws) Medication  Administration: Crushed with puree Supervision: Patient able to self feed Compensations: Slow rate;Small sips/bites Postural Changes: Seated upright at 90 degrees;Remain upright for at least 30 minutes after po intake    Other  Recommendations Recommended Consults: Consider esophageal assessment Oral Care Recommendations: Oral care QID   Follow up Recommendations Other (comment) (TBD)      Frequency and Duration min 1 x/week  1 week;2 weeks       Prognosis Prognosis for Safe Diet Advancement: Fair      Swallow Study   General Date of Onset: 12/21/19 HPI: 86year old male admitted 12/21/19 from ALF after a fall and syncope. PMH: Alzheimer's dementia, chronic dysphagia, FTT, AFib, deterioration over the last year, palliative care on board. CTChest - achalasia, multifocal PNA, new plus old, R>L, likely aspiration Type of Study: Bedside Swallow Evaluation Previous Swallow Assessment: history of esophageal issues Diet Prior to this Study: NPO Temperature Spikes Noted: No Respiratory Status: Nasal cannula History of Recent Intubation: No Behavior/Cognition: Alert;Cooperative;Pleasant mood Oral Cavity Assessment: Within Functional Limits Oral Care Completed by SLP: No Oral Cavity - Dentition: Dentures, top;Dentures, bottom Vision: Functional for self-feeding Self-Feeding Abilities: Able to feed self;Needs assist Patient Positioning: Upright in bed Baseline Vocal Quality: Normal Volitional Cough: Weak Volitional Swallow: Able to elicit    Oral/Motor/Sensory Function Overall Oral Motor/Sensory Function: Generalized oral weakness   Ice Chips Ice chips: Within functional limits Presentation: Spoon   Thin Liquid Thin Liquid: Within functional limits Presentation: Cup    Cloe Sockwell B. Quentin Ore, [facility], [place] Speech Language Pathologist Office: [phone]  Shonna Chock 12/21/2019,3:23 PM

## 2019-12-21 NOTE — ED Provider Notes (Signed)
[facility] Provider Note   CSN: [id number] Arrival date & time: 12/21/19  8841     History No chief complaint on file.   Jeremy Kelley is a 86 y.o. male.   Fall This is a new problem. The current episode started 1 to 2 hours ago. The problem occurs rarely. The problem has not changed since onset.Pertinent negatives include no chest pain, no headaches and no shortness of breath. Associated symptoms comments: Skin tears on the left arm and leg. Nothing aggravates the symptoms. Nothing relieves the symptoms. He has tried nothing for the symptoms. The treatment provided mild relief.       No past medical history on file.  Patient Active Problem List   Diagnosis Date Noted  . Fall at home, initial encounter 12/21/2019  . Failure to thrive in adult 12/21/2019     The histories are not reviewed yet. Please review them in the "History" navigator section and refresh this [place].     No family history on file.  Social History   Tobacco Use  . Smoking status: Not on file  Substance Use Topics  . Alcohol use: Not on file  . Drug use: Not on file    Home Medications Prior to Admission medications   Medication Sig Start Date End Date Taking? Authorizing Provider  acetaminophen (TYLENOL) 325 MG tablet Take 650 mg by mouth every 6 (six) hours as needed for mild pain.   Yes [provider]  B Complex Vitamins (VITAMIN B COMPLEX) TABS Take 1 tablet by mouth daily.   Yes [provider]  cholecalciferol (VITAMIN D3) 25 MCG (1000 UNIT) tablet Take 1,000 Units by mouth daily.   Yes [provider]  colestipol (COLESTID) 1 g tablet Take 1 g by mouth 2 (two) times daily. 12/11/19  Yes [provider]  donepezil (ARICEPT) 10 MG tablet Take 10 mg by mouth at bedtime. 12/12/19  Yes [provider]  ELIQUIS 2.5 MG TABS tablet Take 2.5 mg by mouth 2 (two) times daily. 12/19/19  Yes [provider]    ipratropium (ATROVENT) 0.06 % nasal spray Place 2 sprays into both nostrils 2 (two) times daily as needed for congestion. 11/13/19  Yes [provider]  K Phos Mono-Sod Phos Di & Mono (VIRT-PHOS 250 NEUTRAL PO) Take 500 mg by mouth in the morning and at bedtime.   Yes [provider]  liver oil-zinc oxide (DESITIN) 40 % ointment Apply 1 application topically at bedtime. Apply to coccyx/sacrum area   Yes [provider]  loperamide (IMODIUM) 2 MG capsule Take 2 mg by mouth 3 (three) times daily. 12/12/19  Yes [provider]  loperamide (IMODIUM) 2 MG capsule Take 2 mg by mouth 4 (four) times daily as needed for diarrhea or loose stools.   Yes [provider]  metoprolol succinate (TOPROL-XL) 25 MG 24 hr tablet Take 25 mg by mouth at bedtime. 12/19/19  Yes [provider]  MYRBETRIQ 50 MG TB24 tablet Take 50 mg by mouth daily. 11/27/19  Yes [provider]  NONFORMULARY OR COMPOUNDED ITEM Apply 1 application topically at bedtime. Apply to affected area(s)  TAC 0.1% lotion/eucerin 1:1   Yes [provider]  Olopatadine HCl 0.2 % SOLN Place 1 drop into both eyes daily. Itching/allergy   Yes [provider]  Polyethyl Glycol-Propyl Glycol (SYSTANE) 0.4-0.3 % GEL ophthalmic gel Place 1 application into both eyes at bedtime.   Yes [provider]  potassium chloride (KLOR-CON) 10 MEQ tablet Take 10 mEq by mouth daily.   Yes [provider]  Probiotic Product (FLORAJEN DIGESTION PO) Take 1 capsule by mouth daily.   Yes [provider]  Psyllium (FIBER) 28.3 % POWD Take 30 mLs by mouth in the morning and at bedtime. Mix with suitable liquid   Yes [provider]  torsemide (DEMADEX) 20 MG tablet Take 20 mg by mouth daily. 12/19/19  Yes [provider]    Allergies    Patient has no known allergies.  Review of Systems   Review of Systems  Constitutional: Negative for chills and fever.   HENT: Negative for congestion and rhinorrhea.   Respiratory: Negative for cough and shortness of breath.   Cardiovascular: Negative for chest pain and palpitations.  Gastrointestinal: Negative for diarrhea, nausea and vomiting.  Genitourinary: Negative for difficulty urinating and dysuria.  Musculoskeletal: Positive for arthralgias (l elbow). Negative for back pain.  Skin: Positive for wound. Negative for color change and rash.  Neurological: Negative for light-headedness and headaches.    Physical Exam Updated Vital Signs BP 130/64 (BP Location: Right Arm)   Pulse 61   Temp 98.1 F (36.7 C) (Oral)   Resp 15   Ht 5\' 11"  (1.803 m)   Wt 59 kg   SpO2 93%   BMI 18.13 kg/m   Physical Exam Vitals and nursing note reviewed. Exam conducted with a chaperone present.  Constitutional:      General: He is not in acute distress.    Appearance: Normal appearance.  HENT:     Head: Normocephalic and atraumatic.     Nose: No rhinorrhea.  Eyes:     General:        Right eye: No discharge.        Left eye: No discharge.     Conjunctiva/sclera: Conjunctivae normal.  Cardiovascular:     Rate and Rhythm: Regular rhythm. Tachycardia present.  Pulmonary:     Effort: Pulmonary effort is normal. No respiratory distress.     Breath sounds: No stridor. No wheezing.  Abdominal:     General: Abdomen is flat. There is no distension.     Palpations: Abdomen is soft.     Tenderness: There is no abdominal tenderness.  Musculoskeletal:        General: No deformity or signs of injury.  Skin:    General: Skin is warm and dry.  Neurological:     General: No focal deficit present.     Mental Status: He is alert and oriented to person, place, and time. Mental status is at baseline.     Cranial Nerves: No cranial nerve deficit.     Sensory: No sensory deficit.     Motor: No weakness.     Coordination: Coordination normal.  Psychiatric:        Mood and Affect: Mood normal.        Behavior: Behavior  normal.        Thought Content: Thought content normal.     ED Results / Procedures / Treatments   Labs (all labs ordered are listed, but only abnormal results are displayed) Labs Reviewed  COMPREHENSIVE METABOLIC PANEL - Abnormal; Notable for the following components:      Result Value   Glucose, Bld 111 (*)    BUN 26 (*)    Creatinine, Ser 1.59 (*)    Total Protein 6.0 (*)    Albumin 3.4 (*)    Total Bilirubin 1.3 (*)  GFR calc non Af Amer 38 (*)    GFR calc Af Amer 44 (*)    All other components within normal limits  CBC - Abnormal; Notable for the following components:   WBC 13.8 (*)    RBC 4.09 (*)    Hemoglobin 11.5 (*)    MCHC 29.3 (*)    RDW 18.8 (*)    All other components within normal limits  LACTIC ACID, PLASMA - Abnormal; Notable for the following components:   Lactic Acid, Venous 3.0 (*)    All other components within normal limits  PROTIME-INR - Abnormal; Notable for the following components:   Prothrombin Time 17.6 (*)    INR 1.5 (*)    All other components within normal limits  CBG MONITORING, ED - Abnormal; Notable for the following components:   Glucose-Capillary 102 (*)    All other components within normal limits  I-STAT CHEM 8, ED - Abnormal; Notable for the following components:   BUN 28 (*)    Creatinine, Ser 1.50 (*)    Glucose, Bld 108 (*)    Hemoglobin 12.6 (*)    HCT 37.0 (*)    All other components within normal limits  SARS CORONAVIRUS 2 BY RT PCR (HOSPITAL ORDER, [place] [facility])  CULTURE, BLOOD (ROUTINE X 2)  CULTURE, BLOOD (ROUTINE X 2)  ETHANOL  URINALYSIS, ROUTINE W REFLEX MICROSCOPIC  SAMPLE TO BLOOD BANK    EKG None  Radiology DG Elbow Complete Left  Result Date: 12/21/2019 CLINICAL DATA:  Fall with skin avulsion.  Elbow pain EXAM: LEFT ELBOW - COMPLETE 3+ VIEW COMPARISON:  None. FINDINGS: There is no evidence of fracture, dislocation, or joint effusion. Generalized osteopenia. No imbedded opaque  foreign body suspected. IMPRESSION: Negative for fracture. Electronically Signed   By: Monte Fantasia M.D.   On: 12/21/2019 10:48   CT HEAD WO CONTRAST  Result Date: 12/21/2019 CLINICAL DATA:  Neck trauma EXAM: CT HEAD WITHOUT CONTRAST CT CERVICAL SPINE WITHOUT CONTRAST TECHNIQUE: Multidetector CT imaging of the head and cervical spine was performed following the standard protocol without intravenous contrast. Multiplanar CT image reconstructions of the cervical spine were also generated. COMPARISON:  March 25, 2019 FINDINGS: CT HEAD FINDINGS Brain: No evidence of acute infarction, hemorrhage, hydrocephalus, extra-axial collection or mass lesion/mass effect. Signs of chronic microvascular ischemic change and atrophy as before. Vascular: No hyperdense vessel or unexpected calcification. Skull: Normal. Negative for fracture or focal lesion. Sinuses/Orbits: Mucous retention cysts or polyps in the LEFT maxillary and LEFT ethmoid sinuses unchanged from previous imaging. Post bilateral maxillary antrostomy. Other: None. CT CERVICAL SPINE FINDINGS Alignment: Reversal of normal cervical lordosis in the upper and mid cervical spine. This is seen on a background of multilevel degenerative change. Calcified disc material at C2-3 extends posteriorly. The alignment is unchanged compared to a radiograph from 2017 with mild, approximately 2 mm anterolisthesis of C3 on C4. Skull base and vertebrae: No acute fracture. No primary bone lesion or focal pathologic process. Soft tissues and spinal canal: No prevertebral fluid or swelling. No visible canal hematoma. Disc levels: Multilevel spinal degenerative changes with mild anterolisthesis of C3 on C4. Calcified area extending posteriorly from the disc space likely calcified disc material with mild narrowing of the canal. Alignment unchanged from previous exam, plain film evaluation of 2017. There is also mild anterolisthesis of C4 on C5. Multilevel disc space narrowing  greatest at C4-5, C5-6, C6-7 and C7-T1. Facet arthropathy greatest at C3-4 and C4-5. Upper chest:  Emphysematous changes and patchy bilateral areas of airspace disease with nodular component. Loculated pleural fluid in the LEFT chest partially visualized. Marked patulous appearance of the esophagus. Other: None IMPRESSION: 1. No acute intracranial abnormality. 2. No evidence for acute fracture or traumatic malalignment in the cervical spine. 3. Multilevel spinal degenerative changes of the cervical spine as described. 4. Emphysematous changes and patchy bilateral areas of airspace disease with nodular component. Findings likely reflect multifocal pneumonia and/or aspiration related changes, see dedicated CT of the chest. 5. Loculated pleural fluid may be due to above findings. 6. Marked patulous appearance of the esophagus, configuration could be seen in the setting of achalasia and could contribute to aspiration risk in this patient who is shown to aspirate on previous barium evaluation. 7. Emphysema. Emphysema (ICD10-J43.9). Electronically Signed   By: Zetta Bills M.D.   On: 12/21/2019 11:24   CT Cervical Spine Wo Contrast  Result Date: 12/21/2019 CLINICAL DATA:  Neck trauma EXAM: CT HEAD WITHOUT CONTRAST CT CERVICAL SPINE WITHOUT CONTRAST TECHNIQUE: Multidetector CT imaging of the head and cervical spine was performed following the standard protocol without intravenous contrast. Multiplanar CT image reconstructions of the cervical spine were also generated. COMPARISON:  March 25, 2019 FINDINGS: CT HEAD FINDINGS Brain: No evidence of acute infarction, hemorrhage, hydrocephalus, extra-axial collection or mass lesion/mass effect. Signs of chronic microvascular ischemic change and atrophy as before. Vascular: No hyperdense vessel or unexpected calcification. Skull: Normal. Negative for fracture or focal lesion. Sinuses/Orbits: Mucous retention cysts or polyps in the LEFT maxillary and LEFT ethmoid sinuses  unchanged from previous imaging. Post bilateral maxillary antrostomy. Other: None. CT CERVICAL SPINE FINDINGS Alignment: Reversal of normal cervical lordosis in the upper and mid cervical spine. This is seen on a background of multilevel degenerative change. Calcified disc material at C2-3 extends posteriorly. The alignment is unchanged compared to a radiograph from 2017 with mild, approximately 2 mm anterolisthesis of C3 on C4. Skull base and vertebrae: No acute fracture. No primary bone lesion or focal pathologic process. Soft tissues and spinal canal: No prevertebral fluid or swelling. No visible canal hematoma. Disc levels: Multilevel spinal degenerative changes with mild anterolisthesis of C3 on C4. Calcified area extending posteriorly from the disc space likely calcified disc material with mild narrowing of the canal. Alignment unchanged from previous exam, plain film evaluation of 2017. There is also mild anterolisthesis of C4 on C5. Multilevel disc space narrowing greatest at C4-5, C5-6, C6-7 and C7-T1. Facet arthropathy greatest at C3-4 and C4-5. Upper chest: Emphysematous changes and patchy bilateral areas of airspace disease with nodular component. Loculated pleural fluid in the LEFT chest partially visualized. Marked patulous appearance of the esophagus. Other: None IMPRESSION: 1. No acute intracranial abnormality. 2. No evidence for acute fracture or traumatic malalignment in the cervical spine. 3. Multilevel spinal degenerative changes of the cervical spine as described. 4. Emphysematous changes and patchy bilateral areas of airspace disease with nodular component. Findings likely reflect multifocal pneumonia and/or aspiration related changes, see dedicated CT of the chest. 5. Loculated pleural fluid may be due to above findings. 6. Marked patulous appearance of the esophagus, configuration could be seen in the setting of achalasia and could contribute to aspiration risk in this patient who is shown to  aspirate on previous barium evaluation. 7. Emphysema. Emphysema (ICD10-J43.9). Electronically Signed   By: Zetta Bills M.D.   On: 12/21/2019 11:24   DG Pelvis Portable  Result Date: 12/21/2019 CLINICAL DATA:  Ground level fall EXAM: PORTABLE PELVIS 1-2  VIEWS COMPARISON:  05/03/2019 FINDINGS: Diffuse osseous demineralization. Partially visualized bilateral hip arthroplasties. No evidence of acute fracture, dislocation, or pelvic diastasis. IMPRESSION: Negative. Electronically Signed   By: Davina Poke D.O.   On: 12/21/2019 10:00   CT CHEST ABDOMEN PELVIS W CONTRAST  Result Date: 12/21/2019 CLINICAL DATA:  86 year old male with history of trauma from a fall. Abnormal chest x-ray. EXAM: CT CHEST, ABDOMEN, AND PELVIS WITH CONTRAST TECHNIQUE: Multidetector CT imaging of the chest, abdomen and pelvis was performed following the standard protocol during bolus administration of intravenous contrast. CONTRAST:  149mL OMNIPAQUE IOHEXOL 300 MG/ML  SOLN COMPARISON:  CT the abdomen and pelvis 02/21/2019. No prior chest CT. FINDINGS: CT CHEST FINDINGS Cardiovascular: Heart size is severely enlarged with right ventricular and severe right atrial dilatation. There is no significant pericardial fluid, thickening or pericardial calcification. There is aortic atherosclerosis, as well as atherosclerosis of the great vessels of the mediastinum and the coronary arteries, including calcified atherosclerotic plaque in the left anterior descending and left circumflex coronary arteries. Thickening calcification of the aortic valve. Mediastinum/Nodes: No pathologically enlarged mediastinal or hilar lymph nodes. Severely dilated esophagus. No axillary lymphadenopathy. Lungs/Pleura: No pneumothorax. Patchy areas of ground-glass attenuation and airspace consolidation are scattered throughout the lungs bilaterally (right greater than left), most confluent in the right middle lobe and right lower lobe, compatible with multilobar  pneumonia. Small bilateral pleural effusions, some of which is partially loculated within the major fissures bilaterally. Musculoskeletal: Old healed fracture of the lateral aspect of the left ninth rib. There are no acute displaced fractures or aggressive appearing lytic or blastic lesions noted in the visualized portions of the skeleton. CT ABDOMEN PELVIS FINDINGS Hepatobiliary: No suspicious cystic or solid hepatic lesions. No intra or extrahepatic biliary ductal dilatation. Gallbladder is normal in appearance. Pancreas: No pancreatic mass. No pancreatic ductal dilatation. No pancreatic or peripancreatic fluid collections or inflammatory changes. Spleen: Unremarkable. Adrenals/Urinary Tract: Bilateral kidneys and bilateral adrenal glands are normal in appearance. No hydroureteronephrosis. Urinary bladder is nearly completely obscured by beam hardening artifact from the patient's bilateral hip arthroplasties. Stomach/Bowel: Stomach is completely decompressed, but otherwise unremarkable. No pathologic dilatation of small bowel or colon. Numerous colonic diverticulae are noted, without surrounding inflammatory changes to suggest an acute diverticulitis at this time. The appendix is not confidently identified and may be surgically absent. Regardless, there are no inflammatory changes noted adjacent to the cecum to suggest the presence of an acute appendicitis at this time. Vascular/Lymphatic: Aortic atherosclerosis, without evidence of aneurysm or dissection in the abdominal or pelvic vasculature. No lymphadenopathy noted in the abdomen or pelvis. Reproductive: Status post prostatectomy. At the base of the penis there is a large amount of serpiginous high attenuation material, similar to prior study from 02/21/2019, presumably related to prior prostatectomy. Other: No significant volume of ascites.  No pneumoperitoneum. Musculoskeletal: Status post bilateral hip arthroplasty. There are no acute displaced fractures  aggressive appearing lytic or blastic lesions noted in the visualized portions of the skeleton. IMPRESSION: 1. No definitive evidence of significant acute traumatic injury to the chest, abdomen or pelvis. 2. The appearance of the lungs is highly concerning for multilobar bilateral pneumonia, as above. There are small bilateral partially loculated pleural effusions. 3. Cardiomegaly with right ventricular and right atrial dilatation. 4. Aortic atherosclerosis, in addition to 2 vessel coronary artery disease. 5. There are calcifications of the aortic valve. Echocardiographic correlation for evaluation of potential valvular dysfunction may be warranted if clinically indicated. 6. Colonic diverticulosis without evidence of  acute diverticulitis at this time. 7. Additional incidental findings, as above. Electronically Signed   By: Vinnie Langton M.D.   On: 12/21/2019 11:22   DG Chest Port 1 View  Result Date: 12/21/2019 CLINICAL DATA:  Fall. EXAM: PORTABLE CHEST 1 VIEW COMPARISON:  04/30/2019. FINDINGS: Mediastinum and hilar structures normal. Prominent cardiomegaly again noted. Prominent bibasilar pulmonary infiltrates/edema noted on today's exam. Small bilateral pleural effusions noted. Biapical pleural thickening consistent scarring. What appears to represent a skin fold on the left is noted on 1-2 views obtained. Lung markings are noted outside of this apparent skin fold. This finding is not noted on the second image obtained. To completely ensure the absence of a pneumothorax repeat PA chest x-ray suggested. IMPRESSION: 1. What appears to represent a skin fold on the left is noted on 1 of the two views obtained. To completely ensure the absence of a small pneumothorax repeat PA chest x-ray suggested. 2. Prominent bibasilar pulmonary infiltrates/edema noted on today's exam. 3.  Severe cardiomegaly again noted. Critical Value/emergent results were called by telephone at the time of interpretation on 12/21/2019 at  10:12 am to provider [place] , who verbally acknowledged these results. Electronically Signed   By: Marcello Moores  Register   On: 12/21/2019 10:10   DG Knee Complete 4 Views Left  Result Date: 12/21/2019 CLINICAL DATA:  Fall. EXAM: LEFT KNEE - COMPLETE 4+ VIEW COMPARISON:  03/25/2019. FINDINGS: Bandage noted anteriorly. No evidence of effusion. Diffuse osteopenia. Mild patellofemoral degenerative change. No evidence of fracture dislocation. Peripheral vascular calcification. IMPRESSION: 1. Diffuse osteopenia. Mild degenerative change. No acute bony or joint abnormality. 2.  Peripheral vascular disease. Electronically Signed   By: Marcello Moores  Register   On: 12/21/2019 10:49   ECHOCARDIOGRAM COMPLETE  Result Date: 12/21/2019    ECHOCARDIOGRAM REPORT   Patient Name:   ABDULAZIZ TOMAN Date of Exam: 12/21/2019 Medical Rec #:  [id number]        Height:       71.0 in Accession #:    [id number]       Weight:       130.0 lb Date of Birth:  04/30/1926       BSA:          1.756 m Patient Age:    36 years         BP:           121/55 mmHg Patient Gender: M                HR:           55 bpm. Exam Location:  Inpatient Procedure: 2D Echo Indications:    syncope 780.2  History:        Patient has no prior history of Echocardiogram examinations. No                 medical hx on file.  Sonographer:    Jannett Celestine RDCS (AE) Referring Phys: [id number] Lequita Halt  Sonographer Comments: Suboptimal apical window. Restricted mobility. IMPRESSIONS  1. Compared to previous echo from 10/01/19 ( under MRN [id number]) the RV size and function are unchanged . He has cor pulmonale.  2. The aortic insufficiency is better visualized in previous echo and is likely moderate - severe.  3. Left ventricular ejection fraction, by estimation, is 55 to 60%. The left ventricle has normal function. The left ventricle has no regional wall motion abnormalities. Left ventricular diastolic function could not be evaluated. There is the interventricular  septum is  flattened in diastole ('D' shaped left ventricle), consistent with right ventricular volume overload.  4. Right ventricular systolic function is mildly reduced. The right ventricular size is severely enlarged. There is severely elevated pulmonary artery systolic pressure. The estimated right ventricular systolic pressure is 95.6 mmHg.  5. Left atrial size was mildly dilated.  6. Right atrial size was severely dilated.  7. The mitral valve is abnormal. Mild mitral valve regurgitation.  8. The tricuspid valve is degenerative. Tricuspid valve regurgitation is moderate to severe.  9. The aortic valve is calcified. Aortic valve regurgitation is mild to moderate. No aortic stenosis is present. FINDINGS  Left Ventricle: Left ventricular ejection fraction, by estimation, is 55 to 60%. The left ventricle has normal function. The left ventricle has no regional wall motion abnormalities. The left ventricular internal cavity size was small. There is no left ventricular hypertrophy. The interventricular septum is flattened in diastole ('D' shaped left ventricle), consistent with right ventricular volume overload. Left ventricular diastolic function could not be evaluated due to atrial fibrillation. Left ventricular diastolic function could not be evaluated. Right Ventricle: The right ventricular size is severely enlarged. Right vetricular wall thickness was not assessed. Right ventricular systolic function is mildly reduced. There is severely elevated pulmonary artery systolic pressure. The tricuspid regurgitant velocity is 3.87 m/s, and with an assumed right atrial pressure of 15 mmHg, the estimated right ventricular systolic pressure is 38.7 mmHg. Left Atrium: Left atrial size was mildly dilated. Right Atrium: Right atrial size was severely dilated. Pericardium: There is no evidence of pericardial effusion. Mitral Valve: The mitral valve is abnormal. There is mild thickening of the mitral valve leaflet(s). Mild mitral valve  regurgitation. Tricuspid Valve: The tricuspid valve is degenerative in appearance. Tricuspid valve regurgitation is moderate to severe. Aortic Valve: The aortic valve is calcified. Aortic valve regurgitation is mild to moderate. No aortic stenosis is present. Pulmonic Valve: The pulmonic valve was not well visualized. Pulmonic valve regurgitation is not visualized. Aorta: The aortic root and ascending aorta are structurally normal, with no evidence of dilitation. IAS/Shunts: The atrial septum is grossly normal.  LEFT VENTRICLE PLAX 2D LVIDd:         4.50 cm LVIDs:         2.60 cm LV PW:         1.00 cm LV IVS:        0.80 cm LVOT diam:     2.30 cm LV SV:         88 LV SV Index:   50 LVOT Area:     4.15 cm  RIGHT VENTRICLE RV S prime:     8.92 cm/s TAPSE (M-mode): 1.6 cm LEFT ATRIUM         Index      RIGHT ATRIUM           Index LA diam:    3.70 cm 2.11 cm/m RA Area:     34.50 cm                                RA Volume:   139.00 ml 79.17 ml/m  AORTIC VALVE LVOT Vmax:   91.40 cm/s LVOT Vmean:  65.250 cm/s LVOT VTI:    0.213 m  AORTA Ao Root diam: 3.20 cm MITRAL VALVE                TRICUSPID VALVE MV Area (PHT): 5.13 cm  TR Peak grad:   59.9 mmHg MV Decel Time: 148 msec     TR Vmax:        387.00 cm/s MV E velocity: 122.00 cm/s                             SHUNTS                             Systemic VTI:  0.21 m                             Systemic Diam: 2.30 cm Mertie Moores MD Electronically signed by Mertie Moores MD Signature Date/Time: 12/21/2019/2:36:09 PM    Final     Procedures Wound repair  Date/Time: 12/21/2019 10:02 AM Performed by: Breck Coons, MD Authorized by: Breck Coons, MD  Preparation: Wounds were cleaned with saline and gauze. Local anesthesia used: no  Anesthesia: Local anesthesia used: no  Sedation: Patient sedated: no  Patient tolerance: patient tolerated the procedure well with no immediate complications    (including critical care time)  Medications Ordered in  ED Medications  colestipol (COLESTID) tablet 1 g (1 g Oral Given 12/21/19 1439)  metoprolol succinate (TOPROL-XL) 24 hr tablet 25 mg (has no administration in time range)  torsemide (DEMADEX) tablet 20 mg (has no administration in time range)  donepezil (ARICEPT) tablet 10 mg (has no administration in time range)  loperamide (IMODIUM) capsule 2 mg (has no administration in time range)  loperamide (IMODIUM) capsule 2 mg (has no administration in time range)  acidophilus (RISAQUAD) capsule 1 capsule (has no administration in time range)  psyllium (HYDROCIL/METAMUCIL) 1 packet (has no administration in time range)  mirabegron ER (MYRBETRIQ) tablet 50 mg (has no administration in time range)  apixaban (ELIQUIS) tablet 2.5 mg (has no administration in time range)  B-complex with vitamin C tablet 1 tablet (has no administration in time range)  cholecalciferol (VITAMIN D3) tablet 1,000 Units (has no administration in time range)  potassium chloride (KLOR-CON) CR tablet 10 mEq (has no administration in time range)  ipratropium (ATROVENT) 0.06 % nasal spray 2 spray (has no administration in time range)  olopatadine (PATANOL) 0.1 % ophthalmic solution 1 drop (has no administration in time range)  polyvinyl alcohol (LIQUIFILM TEARS) 1.4 % ophthalmic solution 1 drop (has no administration in time range)  acetaminophen (TYLENOL) tablet 650 mg (has no administration in time range)  sodium chloride flush (NS) 0.9 % injection 3 mL (3 mLs Intravenous Given 12/21/19 1406)  0.9 %  sodium chloride infusion ( Intravenous New Bag/Given 12/21/19 1427)  Ampicillin-Sulbactam (UNASYN) 3 g in sodium chloride 0.9 % 100 mL IVPB (3 g Intravenous New Bag/Given 12/21/19 1438)  iohexol (OMNIPAQUE) 300 MG/ML solution 100 mL (100 mLs Intravenous Contrast Given 12/21/19 1103)  vancomycin (VANCOCIN) IVPB 1000 mg/200 mL premix (0 mg Intravenous Stopped 12/21/19 1406)  piperacillin-tazobactam (ZOSYN) IVPB 3.375 g (0 g Intravenous  Stopped 12/21/19 1241)  lactated ringers bolus 1,000 mL (0 mLs Intravenous Stopped 12/21/19 1406)    ED Course  I have reviewed the triage vital signs and the nursing notes.  Pertinent labs & imaging results that were available during my care of the patient were reviewed by me and considered in my medical decision making (see chart for details).    MDM Rules/Calculators/A&P  86 year old male on blood thinners for atrial fibrillation fell while on toilet.  Said he lost his balance trying to get up and landed between the toilet and a cabinet.  Suffered 2 large skin avulsions to his left elbow and left knee.  These are treated dressed and cleaned by myself at bedside.  No stitches required, his tetanus is up-to-date.  Bleeding is controlled.  Upon arrival his airway was intact he had bilateral breath sounds his blood pressure was stable and he was mentating at baseline.  No significant deformity.  Plain films of the chest and pelvis were shot, I reviewed them did not see any significant findings in the pelvis, chest x-ray appeared to have possible chronic changes versus new injury or infection in the right lobe specifically.  Due to this and his status on blood thinners we will get CT imaging of the head neck chest abdomen pelvis.  He will get plain films of the affected extremities as well as trauma labs.  Plain films of the affected area show no acute changes.  CT imaging shows concerns for aspiration versus multifocal pneumonia.  Review with the patient's family members he has a strong history of drooling aspiration.  He has been seen by speech and had swallow studies that show he has difficulties.  This is likely the source of his lung disease, however with his hypoxia earlier in need for oxygenation he will get antibiotics and be admitted to the hospital.  I consulted the medicine team for evaluation for admission and the patient and family and hospital team agreed for  admission.  The patient will be admitted to the hospitalist.  For the remainder this patient's care please see inpatient team notes.  I will intervene as needed while the patient remains in the emergency department.    Final Clinical Impression(s) / ED Diagnoses Final diagnoses:  Fall  Acute hypoxemic respiratory failure ([place])  Aspiration pneumonia of both lungs, unspecified aspiration pneumonia type, unspecified part of lung [facility])    Rx / DC Orders ED Discharge Orders    None       Breck Coons, MD 12/21/19 1502

## 2019-12-21 NOTE — ED Notes (Signed)
Pt. Changed into a clean brief and repositioned in bed.

## 2019-12-22 DIAGNOSIS — Z66 Do not resuscitate: Secondary | ICD-10-CM

## 2019-12-22 DIAGNOSIS — R55 Syncope and collapse: Secondary | ICD-10-CM

## 2019-12-22 DIAGNOSIS — F0280 Dementia in other diseases classified elsewhere without behavioral disturbance: Secondary | ICD-10-CM

## 2019-12-22 DIAGNOSIS — Z789 Other specified health status: Secondary | ICD-10-CM

## 2019-12-22 DIAGNOSIS — E43 Unspecified severe protein-calorie malnutrition: Secondary | ICD-10-CM

## 2019-12-22 DIAGNOSIS — I4891 Unspecified atrial fibrillation: Secondary | ICD-10-CM

## 2019-12-22 LAB — URINALYSIS, ROUTINE W REFLEX MICROSCOPIC
Bacteria, UA: NONE SEEN
Bilirubin Urine: NEGATIVE
Glucose, UA: NEGATIVE mg/dL
Hgb urine dipstick: NEGATIVE
Ketones, ur: NEGATIVE mg/dL
Nitrite: NEGATIVE
Protein, ur: 30 mg/dL — AB
Specific Gravity, Urine: 1.038 — ABNORMAL HIGH (ref 1.005–1.030)
WBC, UA: >50 WBC/hpf — ABNORMAL HIGH (ref 0–5)
pH: 6 (ref 5.0–8.0)

## 2019-12-22 LAB — BASIC METABOLIC PANEL
Anion gap: 9 (ref 5–15)
BUN: 21 mg/dL (ref 8–23)
CO2: 26 mmol/L (ref 22–32)
Calcium: 9.1 mg/dL (ref 8.9–10.3)
Chloride: 108 mmol/L (ref 98–111)
Creatinine, Ser: 1.39 mg/dL — ABNORMAL HIGH (ref 0.61–1.24)
GFR calc Af Amer: 51 mL/min — ABNORMAL LOW (ref >60)
GFR calc non Af Amer: 44 mL/min — ABNORMAL LOW (ref >60)
Glucose, Bld: 88 mg/dL (ref 70–99)
Potassium: 4.2 mmol/L (ref 3.5–5.1)
Sodium: 143 mmol/L (ref 135–145)

## 2019-12-22 LAB — GLUCOSE, CAPILLARY
Glucose-Capillary: 94 mg/dL (ref 70–99)
Glucose-Capillary: 127 mg/dL — ABNORMAL HIGH (ref 70–99)

## 2019-12-22 MED ORDER — SODIUM CHLORIDE 0.9 % IV BOLUS
500.0000 mL | Freq: Once | INTRAVENOUS | Status: AC
  Administered 2019-12-22: 500 mL via INTRAVENOUS

## 2019-12-22 MED ORDER — METOPROLOL SUCCINATE ER 25 MG PO TB24: 12.5000 mg | ORAL_TABLET | Freq: Every day | ORAL | Status: DC

## 2019-12-22 NOTE — Significant Event (Signed)
Rapid Response Event Note   Reason for Call :  bradycardia  Initial Focused Assessment:  RN called stating that the patient became bradycardic while working with PT.  Patient loss consciousness for about 10 minutes.  When I walked into the room the patient was lying down and was AOx4.  MD was at bedside.  She wants patient to have orthostatic vital to see if that is the cause.    BP 117/60 HR 58 Resp 16 O2 98% [place] 4L    Interventions:  Vitals taken, fluids ordered and given, MD made aware of the situation  Plan of Care:  No new orders given.     Event Summary:   MD Notified: Dr. Wynelle Cleveland Call Time: [phone] Arrival Time: [id number] End Time: 0910  Venetia Maxon, RN

## 2019-12-22 NOTE — Progress Notes (Addendum)
PROGRESS NOTE    Jeremy Kelley   BMW:[id number]  DOB: 12/04/1926  DOA: 12/21/2019 PCP: [name], NP   Brief Narrative:  Jeremy Kelley is a frail 86 y/o male with h/o A-fib on Eliquis,Alzheimer's dementia and chronic dysphagia who presents from ALF with syncope.  In the ED pulse ox was in 80s.  CT of the chest revealed multifocal pneumonia and loculated effusions along with a dilated esophagus.    Subjective: Called because he became unresponsive when he was sitting up on the side of the bed while checking orthostatic vitals. His HR dropped to 40s and his BP dropped to 70s.      Assessment & Plan:   Principal Problem:   Syncope - ? If vasovagal or orthostatic- he is on Demadex as outpatient and records reveal that he has has very poor oral intake with severe weight loss and I do feel he is dehydrated- interestingly though, instead of becoming tachycardic with standing, he is becoming bradycardic - EKG completed- he has a h/o A-fib but I don't see any P waves- ? Junction rhythm - hold b blocker and follow on telemetry-  -  he has mod to severe aortic regurgitation and I feel that due to a drop in pre-load, he is becoming unconscious - after being given 500 cc of IVF, his orthostatics were checked again- this time he was able to stand at the bedside and then HR and BP dropped again followed by unconsciousness- at this time I ordered another 500 cc of IVF- we will not stand him up again today but give continuous fluids overnight and recheck orthostatics in AM  Active Problems:    Acute hypoxemic respiratory failure - suspected to be due to aspiration pneumonia with multifocal infiltrates on CT scan - cont Unasyn and follow  UTI? - UA reveals numerous WBC but no bacteria- obtain clean catch UA and culture  CKD 3?  - no old lab work to compare with- Cr is about 1.39 today which has improved from 1.59 - will follow  Severe aortic regurgitation - not a candidate for  repair- holding Demadex as mentioned- no signs of pulmonary edema   Severe protein-calorie malnutrition -  Failure to thrive in adult Severe Generalized weakness with frequent falls - will see if GI can do anything during the EGD to resolved his swallowing issue - palliative care is following    A-fib  - on Toprol as outpatient - as his HR drops only when standing, will not d/c Toprol yet but will cut back from 25 to 12.5 - Eliquis has been held due syncope/frequent falls- follow up on this    Alzheimer's dementia  - he is quite oriented to person, place and situation today- he is aware that he is in the hospital due to passing out - holding Aricept (orthostatic hypotension)   Time spent in minutes: 45 DVT prophylaxis: Heparin Code Status: DNR Family Communication:  Disposition Plan:  Status is: Inpatient  Remains inpatient appropriate because:treating syncope and pneumonia and plan for evaluating dysphagia with EGD Addendum: patient declining EGD- after discussion with palliative care, he would like to return to SNF with hospice  Dispo: The patient is from: ALF              Anticipated d/c is to: TBD              Anticipated d/c date is: 3 days  Patient currently is not medically stable to d/c.      Consultants:   Palliative care  GI Procedures:   2 d ECHO 1. Compared to previous echo from 10/01/19 ( under MRN [id number]) the RV  size and function are unchanged . He has cor pulmonale.  2. The aortic insufficiency is better visualized in previous echo and is  likely moderate - severe.  3. Left ventricular ejection fraction, by estimation, is 55 to 60%. The  left ventricle has normal function. The left ventricle has no regional  wall motion abnormalities. Left ventricular diastolic function could not  be evaluated. There is the  interventricular septum is flattened in diastole ('D' shaped left  ventricle), consistent with right ventricular volume  overload.  4. Right ventricular systolic function is mildly reduced. The right  ventricular size is severely enlarged. There is severely elevated  pulmonary artery systolic pressure. The estimated right ventricular  systolic pressure is 85.2 mmHg.  5. Left atrial size was mildly dilated.  6. Right atrial size was severely dilated.  7. The mitral valve is abnormal. Mild mitral valve regurgitation.  8. The tricuspid valve is degenerative. Tricuspid valve regurgitation is  moderate to severe.  9. The aortic valve is calcified. Aortic valve regurgitation is mild to  moderate. No aortic stenosis is present.  Antimicrobials:  Anti-infectives (From admission, onward)   Start     Dose/Rate Route Frequency Ordered Stop   12/21/19 1400  ampicillin-sulbactam (UNASYN) 1.5 g in sodium chloride 0.9 % 100 mL IVPB  Status:  Discontinued        1.5 g 200 mL/hr over 30 Minutes Intravenous Every 12 hours 12/21/19 1324 12/21/19 1330   12/21/19 1400  Ampicillin-Sulbactam (UNASYN) 3 g in sodium chloride 0.9 % 100 mL IVPB        3 g 200 mL/hr over 30 Minutes Intravenous Every 12 hours 12/21/19 1330     12/21/19 1145  vancomycin (VANCOCIN) IVPB 1000 mg/200 mL premix        1,000 mg 200 mL/hr over 60 Minutes Intravenous  Once 12/21/19 1131 12/21/19 1406   12/21/19 1145  piperacillin-tazobactam (ZOSYN) IVPB 3.375 g        3.375 g 100 mL/hr over 30 Minutes Intravenous  Once 12/21/19 1131 12/21/19 1241       Objective: Vitals:   12/22/19 0942 12/22/19 1003 12/22/19 1013 12/22/19 1028  BP: 117/60 (!) 111/55 (!) 89/38 (!) 157/77  Pulse: (!) 58 (!) 58 60 70  Resp: 16     Temp:      TempSrc:      SpO2: 98%   96%  Weight:      Height:        Intake/Output Summary (Last 24 hours) at 12/22/2019 1314 Last data filed at 12/22/2019 0500 Gross per 24 hour  Intake 240 ml  Output 250 ml  Net -10 ml   Filed Weights   12/21/19 0920 12/22/19 0459  Weight: 59 kg 61.7 kg    Examination: General exam:  Appears comfortable  HEENT: PERRLA, oral mucosa moist, no sclera icterus or thrush Respiratory system: Clear to auscultation. Respiratory effort normal. Cardiovascular system: S1 & S2 heard, RRR.   Gastrointestinal system: Abdomen soft, non-tender, nondistended. Normal bowel sounds. Central nervous system: Alert and oriented. No focal neurological deficits. Extremities: No cyanosis, clubbing or edema Skin:   numerous bruises and some skin tears Psychiatry:  Mood & affect appropriate.     Data Reviewed: I have personally reviewed following labs  and imaging studies  CBC: Recent Labs  Lab 12/21/19 0939 12/21/19 0945  WBC 13.8*  --   HGB 11.5* 12.6*  HCT 39.2 37.0*  MCV 95.8  --   PLT 210  --    Basic Metabolic Panel: Recent Labs  Lab 12/21/19 0939 12/21/19 0945 12/22/19 0352  NA 143 144 143  K 3.7 3.7 4.2  CL 105 105 108  CO2 25  --  26  GLUCOSE 111* 108* 88  BUN 26* 28* 21  CREATININE 1.59* 1.50* 1.39*  CALCIUM 9.2  --  9.1   GFR: Estimated Creatinine Clearance: 30.8 mL/min (A) (by C-G formula based on SCr of 1.39 mg/dL (H)). Liver Function Tests: Recent Labs  Lab 12/21/19 0939  AST 29  ALT 21  ALKPHOS 105  BILITOT 1.3*  PROT 6.0*  ALBUMIN 3.4*   No results for input(s): LIPASE, AMYLASE in the last 168 hours. No results for input(s): AMMONIA in the last 168 hours. Coagulation Profile: Recent Labs  Lab 12/21/19 0939  INR 1.5*   Cardiac Enzymes: No results for input(s): CKTOTAL, CKMB, CKMBINDEX, TROPONINI in the last 168 hours. BNP (last 3 results) No results for input(s): PROBNP in the last 8760 hours. HbA1C: No results for input(s): HGBA1C in the last 72 hours. CBG: Recent Labs  Lab 12/21/19 0923 12/22/19 0811  GLUCAP 102* 94   Lipid Profile: No results for input(s): CHOL, HDL, LDLCALC, TRIG, CHOLHDL, LDLDIRECT in the last 72 hours. Thyroid Function Tests: No results for input(s): TSH, T4TOTAL, FREET4, T3FREE, THYROIDAB in the last 72  hours. Anemia Panel: No results for input(s): VITAMINB12, FOLATE, FERRITIN, TIBC, IRON, RETICCTPCT in the last 72 hours. Urine analysis:    Component Value Date/Time   COLORURINE YELLOW 12/22/2019 0020   APPEARANCEUR CLEAR 12/22/2019 0020   LABSPEC 1.038 (H) 12/22/2019 0020   PHURINE 6.0 12/22/2019 0020   GLUCOSEU NEGATIVE 12/22/2019 0020   HGBUR NEGATIVE 12/22/2019 0020   BILIRUBINUR NEGATIVE 12/22/2019 0020   KETONESUR NEGATIVE 12/22/2019 0020   PROTEINUR 30 (A) 12/22/2019 0020   NITRITE NEGATIVE 12/22/2019 0020   LEUKOCYTESUR LARGE (A) 12/22/2019 0020   Sepsis Labs: @LABRCNTIP (procalcitonin:4,lacticidven:4) ) Recent Results (from the past 240 hour(s))  SARS Coronavirus 2 by RT PCR (hospital order, performed in [facility] [place] hospital lab) Nasopharyngeal Nasopharyngeal Swab     Status: None   Collection Time: 12/21/19  9:42 AM   Specimen: Nasopharyngeal Swab  Result Value Ref Range Status   SARS Coronavirus 2 NEGATIVE NEGATIVE Final    Comment: (NOTE) SARS-CoV-2 target nucleic acids are NOT DETECTED.  The SARS-CoV-2 RNA is generally detectable in upper and lower respiratory specimens during the acute phase of infection. The lowest concentration of SARS-CoV-2 viral copies this assay can detect is 250 copies / mL. A negative result does not preclude SARS-CoV-2 infection and should not be used as the sole basis for treatment or other patient management decisions.  A negative result may occur with improper specimen collection / handling, submission of specimen other than nasopharyngeal swab, presence of viral mutation(s) within the areas targeted by this assay, and inadequate number of viral copies (<250 copies / mL). A negative result must be combined with clinical observations, patient history, and epidemiological information.  Fact Sheet for Patients:   StrictlyIdeas.no  Fact Sheet for Healthcare  Providers: BankingDealers.co.za  This test is not yet approved or  cleared by the [place] FDA and has been authorized for detection and/or diagnosis of SARS-CoV-2 by FDA under an Emergency Use  Authorization (EUA).  This EUA will remain in effect (meaning this test can be used) for the duration of the COVID-19 declaration under Section 564(b)(1) of the Act, 21 U.S.C. section 360bbb-3(b)(1), unless the authorization is terminated or revoked sooner.  Performed at [facility], [place] [address], [place], [place] [address]          Radiology Studies: DG Elbow Complete Left  Result Date: 12/21/2019 CLINICAL DATA:  Fall with skin avulsion.  Elbow pain EXAM: LEFT ELBOW - COMPLETE 3+ VIEW COMPARISON:  None. FINDINGS: There is no evidence of fracture, dislocation, or joint effusion. Generalized osteopenia. No imbedded opaque foreign body suspected. IMPRESSION: Negative for fracture. Electronically Signed   By: Monte Fantasia M.D.   On: 12/21/2019 10:48   CT HEAD WO CONTRAST  Result Date: 12/21/2019 CLINICAL DATA:  Neck trauma EXAM: CT HEAD WITHOUT CONTRAST CT CERVICAL SPINE WITHOUT CONTRAST TECHNIQUE: Multidetector CT imaging of the head and cervical spine was performed following the standard protocol without intravenous contrast. Multiplanar CT image reconstructions of the cervical spine were also generated. COMPARISON:  March 25, 2019 FINDINGS: CT HEAD FINDINGS Brain: No evidence of acute infarction, hemorrhage, hydrocephalus, extra-axial collection or mass lesion/mass effect. Signs of chronic microvascular ischemic change and atrophy as before. Vascular: No hyperdense vessel or unexpected calcification. Skull: Normal. Negative for fracture or focal lesion. Sinuses/Orbits: Mucous retention cysts or polyps in the LEFT maxillary and LEFT ethmoid sinuses unchanged from previous imaging. Post bilateral maxillary antrostomy. Other: None. CT CERVICAL SPINE FINDINGS  Alignment: Reversal of normal cervical lordosis in the upper and mid cervical spine. This is seen on a background of multilevel degenerative change. Calcified disc material at C2-3 extends posteriorly. The alignment is unchanged compared to a radiograph from 2017 with mild, approximately 2 mm anterolisthesis of C3 on C4. Skull base and vertebrae: No acute fracture. No primary bone lesion or focal pathologic process. Soft tissues and spinal canal: No prevertebral fluid or swelling. No visible canal hematoma. Disc levels: Multilevel spinal degenerative changes with mild anterolisthesis of C3 on C4. Calcified area extending posteriorly from the disc space likely calcified disc material with mild narrowing of the canal. Alignment unchanged from previous exam, plain film evaluation of 2017. There is also mild anterolisthesis of C4 on C5. Multilevel disc space narrowing greatest at C4-5, C5-6, C6-7 and C7-T1. Facet arthropathy greatest at C3-4 and C4-5. Upper chest: Emphysematous changes and patchy bilateral areas of airspace disease with nodular component. Loculated pleural fluid in the LEFT chest partially visualized. Marked patulous appearance of the esophagus. Other: None IMPRESSION: 1. No acute intracranial abnormality. 2. No evidence for acute fracture or traumatic malalignment in the cervical spine. 3. Multilevel spinal degenerative changes of the cervical spine as described. 4. Emphysematous changes and patchy bilateral areas of airspace disease with nodular component. Findings likely reflect multifocal pneumonia and/or aspiration related changes, see dedicated CT of the chest. 5. Loculated pleural fluid may be due to above findings. 6. Marked patulous appearance of the esophagus, configuration could be seen in the setting of achalasia and could contribute to aspiration risk in this patient who is shown to aspirate on previous barium evaluation. 7. Emphysema. Emphysema (ICD10-J43.9). Electronically Signed   By:  Zetta Bills M.D.   On: 12/21/2019 11:24   CT Cervical Spine Wo Contrast  Result Date: 12/21/2019 CLINICAL DATA:  Neck trauma EXAM: CT HEAD WITHOUT CONTRAST CT CERVICAL SPINE WITHOUT CONTRAST TECHNIQUE: Multidetector CT imaging of the head and cervical spine was performed following  the standard protocol without intravenous contrast. Multiplanar CT image reconstructions of the cervical spine were also generated. COMPARISON:  March 25, 2019 FINDINGS: CT HEAD FINDINGS Brain: No evidence of acute infarction, hemorrhage, hydrocephalus, extra-axial collection or mass lesion/mass effect. Signs of chronic microvascular ischemic change and atrophy as before. Vascular: No hyperdense vessel or unexpected calcification. Skull: Normal. Negative for fracture or focal lesion. Sinuses/Orbits: Mucous retention cysts or polyps in the LEFT maxillary and LEFT ethmoid sinuses unchanged from previous imaging. Post bilateral maxillary antrostomy. Other: None. CT CERVICAL SPINE FINDINGS Alignment: Reversal of normal cervical lordosis in the upper and mid cervical spine. This is seen on a background of multilevel degenerative change. Calcified disc material at C2-3 extends posteriorly. The alignment is unchanged compared to a radiograph from 2017 with mild, approximately 2 mm anterolisthesis of C3 on C4. Skull base and vertebrae: No acute fracture. No primary bone lesion or focal pathologic process. Soft tissues and spinal canal: No prevertebral fluid or swelling. No visible canal hematoma. Disc levels: Multilevel spinal degenerative changes with mild anterolisthesis of C3 on C4. Calcified area extending posteriorly from the disc space likely calcified disc material with mild narrowing of the canal. Alignment unchanged from previous exam, plain film evaluation of 2017. There is also mild anterolisthesis of C4 on C5. Multilevel disc space narrowing greatest at C4-5, C5-6, C6-7 and C7-T1. Facet arthropathy greatest at C3-4 and C4-5.  Upper chest: Emphysematous changes and patchy bilateral areas of airspace disease with nodular component. Loculated pleural fluid in the LEFT chest partially visualized. Marked patulous appearance of the esophagus. Other: None IMPRESSION: 1. No acute intracranial abnormality. 2. No evidence for acute fracture or traumatic malalignment in the cervical spine. 3. Multilevel spinal degenerative changes of the cervical spine as described. 4. Emphysematous changes and patchy bilateral areas of airspace disease with nodular component. Findings likely reflect multifocal pneumonia and/or aspiration related changes, see dedicated CT of the chest. 5. Loculated pleural fluid may be due to above findings. 6. Marked patulous appearance of the esophagus, configuration could be seen in the setting of achalasia and could contribute to aspiration risk in this patient who is shown to aspirate on previous barium evaluation. 7. Emphysema. Emphysema (ICD10-J43.9). Electronically Signed   By: Zetta Bills M.D.   On: 12/21/2019 11:24   DG Pelvis Portable  Result Date: 12/21/2019 CLINICAL DATA:  Ground level fall EXAM: PORTABLE PELVIS 1-2 VIEWS COMPARISON:  05/03/2019 FINDINGS: Diffuse osseous demineralization. Partially visualized bilateral hip arthroplasties. No evidence of acute fracture, dislocation, or pelvic diastasis. IMPRESSION: Negative. Electronically Signed   By: Davina Poke D.O.   On: 12/21/2019 10:00   CT CHEST ABDOMEN PELVIS W CONTRAST  Result Date: 12/21/2019 CLINICAL DATA:  86 year old male with history of trauma from a fall. Abnormal chest x-ray. EXAM: CT CHEST, ABDOMEN, AND PELVIS WITH CONTRAST TECHNIQUE: Multidetector CT imaging of the chest, abdomen and pelvis was performed following the standard protocol during bolus administration of intravenous contrast. CONTRAST:  183mL OMNIPAQUE IOHEXOL 300 MG/ML  SOLN COMPARISON:  CT the abdomen and pelvis 02/21/2019. No prior chest CT. FINDINGS: CT CHEST FINDINGS  Cardiovascular: Heart size is severely enlarged with right ventricular and severe right atrial dilatation. There is no significant pericardial fluid, thickening or pericardial calcification. There is aortic atherosclerosis, as well as atherosclerosis of the great vessels of the mediastinum and the coronary arteries, including calcified atherosclerotic plaque in the left anterior descending and left circumflex coronary arteries. Thickening calcification of the aortic valve. Mediastinum/Nodes: No pathologically enlarged mediastinal  or hilar lymph nodes. Severely dilated esophagus. No axillary lymphadenopathy. Lungs/Pleura: No pneumothorax. Patchy areas of ground-glass attenuation and airspace consolidation are scattered throughout the lungs bilaterally (right greater than left), most confluent in the right middle lobe and right lower lobe, compatible with multilobar pneumonia. Small bilateral pleural effusions, some of which is partially loculated within the major fissures bilaterally. Musculoskeletal: Old healed fracture of the lateral aspect of the left ninth rib. There are no acute displaced fractures or aggressive appearing lytic or blastic lesions noted in the visualized portions of the skeleton. CT ABDOMEN PELVIS FINDINGS Hepatobiliary: No suspicious cystic or solid hepatic lesions. No intra or extrahepatic biliary ductal dilatation. Gallbladder is normal in appearance. Pancreas: No pancreatic mass. No pancreatic ductal dilatation. No pancreatic or peripancreatic fluid collections or inflammatory changes. Spleen: Unremarkable. Adrenals/Urinary Tract: Bilateral kidneys and bilateral adrenal glands are normal in appearance. No hydroureteronephrosis. Urinary bladder is nearly completely obscured by beam hardening artifact from the patient's bilateral hip arthroplasties. Stomach/Bowel: Stomach is completely decompressed, but otherwise unremarkable. No pathologic dilatation of small bowel or colon. Numerous colonic  diverticulae are noted, without surrounding inflammatory changes to suggest an acute diverticulitis at this time. The appendix is not confidently identified and may be surgically absent. Regardless, there are no inflammatory changes noted adjacent to the cecum to suggest the presence of an acute appendicitis at this time. Vascular/Lymphatic: Aortic atherosclerosis, without evidence of aneurysm or dissection in the abdominal or pelvic vasculature. No lymphadenopathy noted in the abdomen or pelvis. Reproductive: Status post prostatectomy. At the base of the penis there is a large amount of serpiginous high attenuation material, similar to prior study from 02/21/2019, presumably related to prior prostatectomy. Other: No significant volume of ascites.  No pneumoperitoneum. Musculoskeletal: Status post bilateral hip arthroplasty. There are no acute displaced fractures aggressive appearing lytic or blastic lesions noted in the visualized portions of the skeleton. IMPRESSION: 1. No definitive evidence of significant acute traumatic injury to the chest, abdomen or pelvis. 2. The appearance of the lungs is highly concerning for multilobar bilateral pneumonia, as above. There are small bilateral partially loculated pleural effusions. 3. Cardiomegaly with right ventricular and right atrial dilatation. 4. Aortic atherosclerosis, in addition to 2 vessel coronary artery disease. 5. There are calcifications of the aortic valve. Echocardiographic correlation for evaluation of potential valvular dysfunction may be warranted if clinically indicated. 6. Colonic diverticulosis without evidence of acute diverticulitis at this time. 7. Additional incidental findings, as above. Electronically Signed   By: Vinnie Langton M.D.   On: 12/21/2019 11:22   DG Chest Port 1 View  Result Date: 12/21/2019 CLINICAL DATA:  Fall. EXAM: PORTABLE CHEST 1 VIEW COMPARISON:  04/30/2019. FINDINGS: Mediastinum and hilar structures normal. Prominent  cardiomegaly again noted. Prominent bibasilar pulmonary infiltrates/edema noted on today's exam. Small bilateral pleural effusions noted. Biapical pleural thickening consistent scarring. What appears to represent a skin fold on the left is noted on 1-2 views obtained. Lung markings are noted outside of this apparent skin fold. This finding is not noted on the second image obtained. To completely ensure the absence of a pneumothorax repeat PA chest x-ray suggested. IMPRESSION: 1. What appears to represent a skin fold on the left is noted on 1 of the two views obtained. To completely ensure the absence of a small pneumothorax repeat PA chest x-ray suggested. 2. Prominent bibasilar pulmonary infiltrates/edema noted on today's exam. 3.  Severe cardiomegaly again noted. Critical Value/emergent results were called by telephone at the time of interpretation on  12/21/2019 at 10:12 am to provider ERIC KATZ , who verbally acknowledged these results. Electronically Signed   By: Marcello Moores  Register   On: 12/21/2019 10:10   DG Knee Complete 4 Views Left  Result Date: 12/21/2019 CLINICAL DATA:  Fall. EXAM: LEFT KNEE - COMPLETE 4+ VIEW COMPARISON:  03/25/2019. FINDINGS: Bandage noted anteriorly. No evidence of effusion. Diffuse osteopenia. Mild patellofemoral degenerative change. No evidence of fracture dislocation. Peripheral vascular calcification. IMPRESSION: 1. Diffuse osteopenia. Mild degenerative change. No acute bony or joint abnormality. 2.  Peripheral vascular disease. Electronically Signed   By: Marcello Moores  Register   On: 12/21/2019 10:49   ECHOCARDIOGRAM COMPLETE  Result Date: 12/21/2019    ECHOCARDIOGRAM REPORT   Patient Name:   SAAGAR TORTORELLA Date of Exam: 12/21/2019 Medical Rec #:  [id number]        Height:       71.0 in Accession #:    [id number]       Weight:       130.0 lb Date of Birth:  1926/09/02       BSA:          1.756 m Patient Age:    6 years         BP:           121/55 mmHg Patient Gender: M                 HR:           55 bpm. Exam Location:  Inpatient Procedure: 2D Echo Indications:    syncope 780.2  History:        Patient has no prior history of Echocardiogram examinations. No                 medical hx on file.  Sonographer:    Jannett Celestine RDCS (AE) Referring Phys: [id number] Lequita Halt  Sonographer Comments: Suboptimal apical window. Restricted mobility. IMPRESSIONS  1. Compared to previous echo from 10/01/19 ( under MRN [id number]) the RV size and function are unchanged . He has cor pulmonale.  2. The aortic insufficiency is better visualized in previous echo and is likely moderate - severe.  3. Left ventricular ejection fraction, by estimation, is 55 to 60%. The left ventricle has normal function. The left ventricle has no regional wall motion abnormalities. Left ventricular diastolic function could not be evaluated. There is the interventricular septum is flattened in diastole ('D' shaped left ventricle), consistent with right ventricular volume overload.  4. Right ventricular systolic function is mildly reduced. The right ventricular size is severely enlarged. There is severely elevated pulmonary artery systolic pressure. The estimated right ventricular systolic pressure is 36.1 mmHg.  5. Left atrial size was mildly dilated.  6. Right atrial size was severely dilated.  7. The mitral valve is abnormal. Mild mitral valve regurgitation.  8. The tricuspid valve is degenerative. Tricuspid valve regurgitation is moderate to severe.  9. The aortic valve is calcified. Aortic valve regurgitation is mild to moderate. No aortic stenosis is present. FINDINGS  Left Ventricle: Left ventricular ejection fraction, by estimation, is 55 to 60%. The left ventricle has normal function. The left ventricle has no regional wall motion abnormalities. The left ventricular internal cavity size was small. There is no left ventricular hypertrophy. The interventricular septum is flattened in diastole ('D' shaped left ventricle),  consistent with right ventricular volume overload. Left ventricular diastolic function could not be evaluated due to atrial fibrillation. Left ventricular diastolic function could not be  evaluated. Right Ventricle: The right ventricular size is severely enlarged. Right vetricular wall thickness was not assessed. Right ventricular systolic function is mildly reduced. There is severely elevated pulmonary artery systolic pressure. The tricuspid regurgitant velocity is 3.87 m/s, and with an assumed right atrial pressure of 15 mmHg, the estimated right ventricular systolic pressure is 32.9 mmHg. Left Atrium: Left atrial size was mildly dilated. Right Atrium: Right atrial size was severely dilated. Pericardium: There is no evidence of pericardial effusion. Mitral Valve: The mitral valve is abnormal. There is mild thickening of the mitral valve leaflet(s). Mild mitral valve regurgitation. Tricuspid Valve: The tricuspid valve is degenerative in appearance. Tricuspid valve regurgitation is moderate to severe. Aortic Valve: The aortic valve is calcified. Aortic valve regurgitation is mild to moderate. No aortic stenosis is present. Pulmonic Valve: The pulmonic valve was not well visualized. Pulmonic valve regurgitation is not visualized. Aorta: The aortic root and ascending aorta are structurally normal, with no evidence of dilitation. IAS/Shunts: The atrial septum is grossly normal.  LEFT VENTRICLE PLAX 2D LVIDd:         4.50 cm LVIDs:         2.60 cm LV PW:         1.00 cm LV IVS:        0.80 cm LVOT diam:     2.30 cm LV SV:         88 LV SV Index:   50 LVOT Area:     4.15 cm  RIGHT VENTRICLE RV S prime:     8.92 cm/s TAPSE (M-mode): 1.6 cm LEFT ATRIUM         Index      RIGHT ATRIUM           Index LA diam:    3.70 cm 2.11 cm/m RA Area:     34.50 cm                                RA Volume:   139.00 ml 79.17 ml/m  AORTIC VALVE LVOT Vmax:   91.40 cm/s LVOT Vmean:  65.250 cm/s LVOT VTI:    0.213 m  AORTA Ao Root diam:  3.20 cm MITRAL VALVE                TRICUSPID VALVE MV Area (PHT): 5.13 cm     TR Peak grad:   59.9 mmHg MV Decel Time: 148 msec     TR Vmax:        387.00 cm/s MV E velocity: 122.00 cm/s                             SHUNTS                             Systemic VTI:  0.21 m                             Systemic Diam: 2.30 cm Mertie Moores MD Electronically signed by Mertie Moores MD Signature Date/Time: 12/21/2019/2:36:09 PM    Final       Scheduled Meds: . acidophilus  1 capsule Oral Daily  . B-complex with vitamin C  1 tablet Oral Daily  . cholecalciferol  1,000 Units Oral Daily  . colestipol  1 g Oral BID  .  donepezil  10 mg Oral QHS  . heparin injection (subcutaneous)  5,000 Units Subcutaneous Q12H  . ipratropium  2 spray Each Nare TID  . loperamide  2 mg Oral TID  . metoprolol succinate  25 mg Oral QHS  . mirabegron ER  50 mg Oral Daily  . olopatadine  1 drop Both Eyes BID  . polyvinyl alcohol  1 drop Both Eyes QHS  . potassium chloride  10 mEq Oral Daily  . psyllium  1 packet Oral Daily  . sodium chloride flush  3 mL Intravenous Q12H   Continuous Infusions: . ampicillin-sulbactam (UNASYN) IV 3 g (12/22/19 0158)     LOS: 1 day      Debbe Odea, MD [facility] Pager: [email or web address] 12/22/2019, 1:14 PM

## 2019-12-22 NOTE — Progress Notes (Addendum)
Daily Progress Note   Patient Name: Jeremy Kelley       Date: 12/22/2019 DOB: 1926/06/10  Age: 86 y.o. MRN#: [id number] Attending Physician: Debbe Odea, MD Primary Care Physician: [name], NP Admit Date: 12/21/2019  Reason for Consultation/Follow-up: Establishing goals of care  Subjective: Chart review performed. Received report from primary RN - she reports two significant events happened this morning. The patient attempted to work with PT - when he stood up he went unresponsive for about 10 minutes - he did not lose a pulse or respirations. Shortly after, the RN stated he experienced an episode of asystole. Per the patient's wishes, there was no attempt at resuscitation; however, he did spontaneously regained his pulse.  Went to visit patient at bedside - no visitors present. Patient was lying in bed awake, alert, oriented, and able to participate in conversation. He did not remember the two events that happened this morning. He requested to speak with Judith/HCPOA. Called [name] and spoke with her and Win via speaker phone with patient and provided updates. Patient wanted to continue [place] conversation with [name] and Win in person. Meeting was set up for 1:00pm.  Judith/HCPOA and Win/POA both arrived to patient's bedside for continued [place] meeting. Reviewed current medical situation and options. Discussed continuing aggressive medical interventions vs focusing on a comfort path. After a long discussion of options - the patient decided he did not want to move forward with the EGD. He does want to continue IVF and antibiotics as recommended. He wants to be medically optimized to feel as best he can without invasive procedures. Hospice philosophy and services were explained and offered. He  wants to discharge back to [facility] with home hospice support. Discussed the possible need of extra care which could be through home health aids if he was interested. He was agreeable. The patient relied heavily on [name] to help make the decisions for his care, but he was agreeable to the plan as outlined above. The patient made it very clear that he does not want any interventions that will prolong his life once discharged. [name] is supportive of the patient and his goals. She worked for [employer] for many years and states she is very Investment banker, corporate with their services.  [name] and Win brought a copy of the patient's Living Will and HCPOA.  Length of Stay: 1  Current Medications: Scheduled Meds:  . acidophilus  1 capsule Oral Daily  . B-complex with vitamin C  1 tablet Oral Daily  . cholecalciferol  1,000 Units Oral Daily  . colestipol  1 g Oral BID  . donepezil  10 mg Oral QHS  . heparin injection (subcutaneous)  5,000 Units Subcutaneous Q12H  . ipratropium  2 spray Each Nare TID  . loperamide  2 mg Oral TID  . metoprolol succinate  25 mg Oral QHS  . mirabegron ER  50 mg Oral Daily  . olopatadine  1 drop Both Eyes BID  . polyvinyl alcohol  1 drop Both Eyes QHS  . potassium chloride  10 mEq Oral Daily  . psyllium  1 packet Oral Daily  . sodium chloride flush  3 mL Intravenous Q12H    Continuous Infusions: . sodium chloride 100 mL/hr at 12/22/19 0008  . ampicillin-sulbactam (UNASYN) IV 3 g (12/22/19 0158)  . sodium chloride      PRN Meds: acetaminophen, loperamide  Physical Exam Vitals and nursing note reviewed.  Constitutional:      General: He is not in acute distress.    Appearance: He is ill-appearing.  Pulmonary:     Effort: No respiratory distress.  Skin:    General: Skin is warm and dry.  Neurological:     Mental Status: He is alert and oriented to person, place, and time.     Motor: Weakness present.  Psychiatric:        Mood and Affect: Affect is blunt.         Behavior: Behavior is cooperative.        Cognition and Memory: Cognition and memory normal.             Vital Signs: BP (!) 157/77 (BP Location: Right Arm)   Pulse 70   Temp (!) 97.5 F (36.4 C)   Resp 16   Ht 5\' 11"  (1.803 m)   Wt 61.7 kg   SpO2 96%   BMI 18.97 kg/m  SpO2: SpO2: 96 % O2 Device: O2 Device: Nasal Cannula O2 Flow Rate: O2 Flow Rate (L/min): 3 L/min  Intake/output summary:   Intake/Output Summary (Last 24 hours) at 12/22/2019 1145 Last data filed at 12/22/2019 0500 Gross per 24 hour  Intake 290 ml  Output 250 ml  Net 40 ml   LBM: Last BM Date: 12/22/19 Baseline Weight: Weight: 59 kg Most recent weight: Weight: 61.7 kg       Palliative Assessment/Data: PPS 30%    Flowsheet Rows     Most Recent Value  Intake Tab  Referral Department Hospitalist  Unit at Time of Referral ER  Palliative Care Primary Diagnosis Neurology  Date Notified 12/21/19  Palliative Care Type New Palliative care  Reason for referral Clarify Goals of Care, Psychosocial or Spiritual support, Counsel Regarding Hospice  Date of Admission 12/21/19  Date first seen by Palliative Care 12/21/19  # of days Palliative referral response time 0 Day(s)  # of days IP prior to Palliative referral 0  Clinical Assessment  Psychosocial & Spiritual Assessment  Palliative Care Outcomes  Patient/Family meeting held? Yes  Who was at the meeting? HCPOA  Palliative Care Outcomes Clarified goals of care, Counseled regarding hospice      Patient Active Problem List   Diagnosis Date Noted  . Fall at home, initial encounter 12/21/2019  . Failure to thrive in adult 12/21/2019  . Acute hypoxemic respiratory failure ([place])   .  Aspiration pneumonia of both lungs ([place])   . Palliative care by specialist   . Goals of care, counseling/discussion   . Generalized weakness   . Encounter for hospice care discussion     Palliative Care Assessment & Plan   Patient Profile: 86 y.o. male  with past medical  history of Alzheimer dementia, chronic dysphagia, failure to thrive, atrial fibrillation was seen in the ED on 12/21/19 for syncope and fall.   Per H&P-- ED Course:Oxygen saturation was found in the 80s,trauma survey showed no acute fracture or dislocation. CT chest showed achalasia and multifocal pneumonia new plus old,right more than left,likely aspiration.  Admitted on 12/21/2019 with syncope, acute hypoxic respiratory failure secondary to aspiration pneumonia, failure to thrive, AKI, chronic dysphagia.    Assessment: Syncope Acute hypoxic respiratory failure UTI CKD Severe aortic regurgitation Severe protein-calorie malnutrition Failure to thrive in adult Atrial fibrillation Alzheimer's dementia  Recommendations/Plan:  Continue medical management without invasive procedures  The patient does not want to move forward with EGD tomorrow. He wants to continue IVF, antibiotics, and medical management as recommended while in house to feel as best he can for discharge  Continue DNR/DNI as previously documented  The patient's goal is to discharge back to [facility] assisted living with home hospice support when medically stable. He and Judith/HCPOA are interested in getting more information on home health aids to supplement care around the assisted living and hospice services. TOC notified and consult placed.  Copy of patient's Living Will and HCPOA was obtained - will be scanned into medical record/Vynca  PMT will continue to follow peripherally. If there are any imminent needs please call the service directly  Goals of Care and Additional Recommendations:  Limitations on Scope of Treatment: no invasive procedures, no EGD  Code Status:    Code Status Orders  (From admission, onward)         Start     Ordered   12/21/19 1302  Do not attempt resuscitation (DNR)  Continuous       Question Answer Comment  In the event of cardiac or respiratory ARREST Do not call a  "code blue"   In the event of cardiac or respiratory ARREST Do not perform Intubation, CPR, defibrillation or ACLS   In the event of cardiac or respiratory ARREST Use medication by any route, position, wound care, and other measures to relive pain and suffering. May use oxygen, suction and manual treatment of airway obstruction as needed for comfort.      12/21/19 1301        Code Status History    Date Active Date Inactive Code Status Order ID Comments User Context   12/21/2019 1257 12/21/2019 1301 Full Code [id number]  Lequita Halt, MD ED   Advance Care Planning Activity    Advance Directive Documentation     Most Recent Value  Type of Advance Directive Out of facility DNR (pink MOST or yellow form)  Pre-existing out of facility DNR order (yellow form or pink MOST form) Pink Most/Yellow Form available - Physician notified to receive inpatient order  "MOST" Form in Place? --       Prognosis:   < 6 months  Discharge Planning:  [place] with Hospice  Care plan was discussed with primary RN, patient, [name], Win, Dr. Wynelle Cleveland, [facility], hospice liaison, GI  Thank you for allowing the Palliative Medicine Team to assist in the care of this patient.   Total Time 35 minutes Prolonged Time Billed  no       Greater than 50%  of this time was spent counseling and coordinating care related to the above assessment and plan.  Lin Landsman, NP  Please contact Palliative Medicine Team phone at [phone] for questions and concerns.

## 2019-12-22 NOTE — Progress Notes (Signed)
Pt finished 500 cc bolus.  Per MD to recheck orthostatic vitals. Pt was not able to stand for 3 minutes.  After laying in bed, pt became unresponsive.  HR dropped to 30's, per CCMD pt heartbeat paused for a few seconds.  After a couple of minutes pt became alert. B/P 157/77 HR 70 O293.  MD notified.  Second bolus 500 cc ordered. Will continue to monitor.

## 2019-12-22 NOTE — Progress Notes (Signed)
Hydrologist [facility]) Hospital Liaison: RN note    Notified by Transition of Care Manger of patient/family request for [facility] services at [facility] after discharge. Chart and patient information under review by [facility] physician. Hospice eligibility pending currently.    Writer spoke with [name] to initiate education related to hospice philosophy, services and team approach to care.           verbalized understanding of information given. Per discussion, plan is for discharge to [facility] by [facility] once medically stable.   Please send signed and completed DNR form home with patient/family. Patient will need prescriptions for discharge comfort medications.     DME needs have been discussed, patient currently has the following equipment in the home: walker, cane, and WC.  Patient/family requests the following DME for delivery to the home: oxygen. [place] equipment manager has been notified and will contact DME provider to arrange delivery to the appropriate address. [name] is the family member to contact to arrange time of delivery.     [facility] aware of the above. Please notify [facility] when patient is ready to leave the unit at discharge. (Call [phone] or [phone] after 5pm.) [facility] information and contact numbers given to [place].      Please call with any hospice related questions.     Thank you for this referral.     Clementeen Hoof, BSN, [facility] (listed on AMION under Hospice and [place] [facility] [place])  [phone]

## 2019-12-22 NOTE — Progress Notes (Signed)
OT Cancellation Note  Patient Details Name: DANIELLE LENTO MRN: [id number] DOB: 05/26/1926   Cancelled Treatment:    Reason Eval/Treat Not Completed: Patient not medically ready Syncopal episode with PT and Rapid response called.   Billey Chang, OTR/L  Acute Rehabilitation Services Pager: [phone] Office: [phone] .  12/22/2019, 9:14 AM

## 2019-12-22 NOTE — Evaluation (Signed)
Physical Therapy Evaluation Patient Details Name: Jeremy Kelley MRN: [id number] DOB: 12/18/1926 Today's Date: 12/22/2019   History of Present Illness  Pt is a 86 y.o. M with significant PMH of Alzheimer dementia, chronic dysphagia, failure to thrive, A. fib on [place] who presents with syncope and fall. CT chest showed multifocal PNA. Trauma survey showed no acute fracture or dislocation.  Clinical Impression  Pt received supine; A&Ox3, BP 113/54, SpO2 91% on 4L O2. Pt progressing to edge of bed with minimal assist. Once sitting on edge of bed ~1 minute, pt stated, "I feel dizzy," and subsequent brady to 38 bpm, and RN Leatrice Jewels present to assist pt back into supine position. Pt unresponsive ~10 minutes, BP 76/58, HR 30-40's, SpO2 80's on 4L O2. RN placed pt on nonrebreather. Pt eventually became responsive and oriented with HR 84 bpm, SpO2 94% on 4L O2, BP 150's/69.     Follow Up Recommendations SNF    Equipment Recommendations  None recommended by PT    Recommendations for Other Services       Precautions / Restrictions Precautions Precautions: Fall;Other (comment) Precaution Comments: orthostatic hypotension Restrictions Weight Bearing Restrictions: No      Mobility  Bed Mobility Overal bed mobility: Needs Assistance Bed Mobility: Supine to Sit;Sit to Supine     Supine to sit: Min assist Sit to supine: Total assist;+2 for physical assistance   General bed mobility comments: Pt coming to edge of bed with increased time and effort, use of bed rail, minA for scooting hip out to edge of bed. TotalA + 2 to return to bed due to decreased responsiveness  Transfers                 General transfer comment: deferred due to syncopal episode  Ambulation/Gait                Stairs            Wheelchair Mobility    Modified Rankin (Stroke Patients Only)       Balance Overall balance assessment: Needs assistance Sitting-balance support: Feet  supported Sitting balance-Leahy Scale: Fair                                       Pertinent Vitals/Pain Pain Assessment: No/denies pain    Home Living Family/patient expects to be discharged to:: Unsure                      Prior Function Level of Independence: Needs assistance   Gait / Transfers Assistance Needed: Ambulates with walker  ADL's / Homemaking Assistance Needed: Requires assist for bathing per chart review        Hand Dominance        Extremity/Trunk Assessment   Upper Extremity Assessment Upper Extremity Assessment: Generalized weakness    Lower Extremity Assessment Lower Extremity Assessment: Generalized weakness    Cervical / Trunk Assessment Cervical / Trunk Assessment: Kyphotic  Communication   Communication: No difficulties  Cognition Arousal/Alertness: Awake/alert Behavior During Therapy: WFL for tasks assessed/performed Overall Cognitive Status: History of cognitive impairments - at baseline                                 General Comments: History of dementia, A&Ox3, requires increased time and repetition for 1 step commands  General Comments      Exercises     Assessment/Plan    PT Assessment Patient needs continued PT services  PT Problem List Decreased strength;Decreased activity tolerance;Decreased balance;Decreased mobility;Decreased cognition;Decreased safety awareness       PT Treatment Interventions DME instruction;Gait training;Functional mobility training;Therapeutic activities;Therapeutic exercise;Balance training;Patient/family education    PT Goals (Current goals can be found in the Care Plan section)  Acute Rehab PT Goals Patient Stated Goal: "to drink his juice." PT Goal Formulation: With patient Time For Goal Achievement: 01/05/20 Potential to Achieve Goals: Fair    Frequency Min 3X/week   Barriers to discharge        Co-evaluation               AM-PAC PT  "6 Clicks" Mobility  Outcome Measure Help needed turning from your back to your side while in a flat bed without using bedrails?: None Help needed moving from lying on your back to sitting on the side of a flat bed without using bedrails?: A Little Help needed moving to and from a bed to a chair (including a wheelchair)?: A Lot Help needed standing up from a chair using your arms (e.g., wheelchair or bedside chair)?: A Lot Help needed to walk in hospital room?: A Lot Help needed climbing 3-5 steps with a railing? : Total 6 Click Score: 14    End of Session Equipment Utilized During Treatment: Oxygen Activity Tolerance: Treatment limited secondary to medical complications (Comment) Patient left: in bed;with call bell/phone within reach;with nursing/sitter in room Nurse Communication: Mobility status PT Visit Diagnosis: Muscle weakness (generalized) (M62.81);Dizziness and giddiness (R42)    Time: 4536-4680 PT Time Calculation (min) (ACUTE ONLY): 39 min   Charges:   PT Evaluation $PT Eval Moderate Complexity: 1 Mod PT Treatments $Therapeutic Activity: 23-37 mins          Wyona Almas, PT, DPT Acute Rehabilitation Services Pager [phone] Office [phone]   Deno Etienne 12/22/2019, 9:32 AM

## 2019-12-22 NOTE — Progress Notes (Signed)
Received a call from [facility] stating pt's HR dropped to 30's sustaining.  Upon arrival to the room, pt was sitting on the side of the bed with PT.  Per Pt pt stated feeling dizzy.  Pt was assisted to lay down .  Pt became unresponsive. B/P dropped to 76/58 HR 30's to 40's O2 Sat 80's. .  MD and Rapid Response notified.  EKG stat ordered. Pt was put on a Non rebreather  Pt became alert after around 10 minutes later.  HR 84 B/P 15/69 O2 Sat 94% on 4L nasal canula Pt is alert and oriented x 4..  Per MD to give 500 cc NS bolus and repeat orthostatic vitals in an hour.  Will continue to monitor

## 2019-12-23 DIAGNOSIS — R55 Syncope and collapse: Secondary | ICD-10-CM

## 2019-12-23 LAB — GLUCOSE, CAPILLARY
Glucose-Capillary: 101 mg/dL — ABNORMAL HIGH (ref 70–99)
Glucose-Capillary: 104 mg/dL — ABNORMAL HIGH (ref 70–99)
Glucose-Capillary: 92 mg/dL (ref 70–99)
Glucose-Capillary: 99 mg/dL (ref 70–99)

## 2019-12-23 LAB — BASIC METABOLIC PANEL
Anion gap: 7 (ref 5–15)
BUN: 19 mg/dL (ref 8–23)
CO2: 24 mmol/L (ref 22–32)
Calcium: 9.1 mg/dL (ref 8.9–10.3)
Chloride: 112 mmol/L — ABNORMAL HIGH (ref 98–111)
Creatinine, Ser: 1.31 mg/dL — ABNORMAL HIGH (ref 0.61–1.24)
GFR calc Af Amer: 55 mL/min — ABNORMAL LOW (ref >60)
GFR calc non Af Amer: 48 mL/min — ABNORMAL LOW (ref >60)
Glucose, Bld: 114 mg/dL — ABNORMAL HIGH (ref 70–99)
Potassium: 5 mmol/L (ref 3.5–5.1)
Sodium: 143 mmol/L (ref 135–145)

## 2019-12-23 MED ORDER — SODIUM CHLORIDE 0.9 % IV SOLN
3.0000 g | Freq: Three times a day (TID) | INTRAVENOUS | Status: DC
  Administered 2019-12-23 – 2019-12-25 (×6): 3 g via INTRAVENOUS
  Filled 2019-12-23 (×7): qty 8

## 2019-12-23 MED ORDER — SODIUM CHLORIDE 0.9 % IV SOLN: INTRAVENOUS | Status: DC

## 2019-12-23 NOTE — Progress Notes (Addendum)
PROGRESS NOTE    Jeremy Kelley   VZD:[id number]  DOB: 05-18-1926  DOA: 12/21/2019 PCP: [name], NP   Brief Narrative:  Jeremy Kelley is a frail 86 y/o male with h/o A-fib on Eliquis,Alzheimer's dementia and chronic dysphagia who presents from ALF with syncope.  In the ED pulse ox was in 80s.  CT of the chest revealed multifocal pneumonia and loculated effusions along with a dilated esophagus.    Subjective: No new complaints.      Assessment & Plan:   Principal Problem:   Syncope - ? If vasovagal or orthostatic- he is on Demadex as outpatient and records reveal that he has has very poor oral intake with severe weight loss and I do feel he is dehydrated- interestingly though, instead of becoming tachycardic with standing, he is becoming bradycardic - EKG completed- he has a h/o A-fib but I don't see any P waves- ? Junction rhythm - hold b blocker and follow on telemetry-  -  he has mod to severe aortic regurgitation and I feel that due to a drop in pre-load, he is becoming unconscious - after being given 500 cc of IVF, his orthostatics were checked again- this time he was able to stand at the bedside and then HR and BP dropped again followed by unconsciousness- at this time I ordered another 500 cc of IVF- we will not stand him up again today but give continuous fluids overnight and recheck orthostatics in AM - 9/12> still waiting for RN to do orthostatic vitals   Active Problems:    Acute hypoxemic respiratory failure - suspected to be due to aspiration pneumonia with multifocal infiltrates on CT scan - cont Unasyn and follow -SLP has evaluated him- he is on a full liquid diet with crushed meds-  - wean O2 as able  UTI? - UA reveals numerous WBC but no bacteria- obtained clean catch UA and culture  CKD 3?  - no old lab work to compare with- Cr is 1.31 today which has improved from 1.59 - will follow  Severe aortic regurgitation - not a candidate for  repair- holding Demadex as mentioned- no signs of pulmonary edema   Severe protein-calorie malnutrition -  Failure to thrive in adult Severe Generalized weakness with frequent falls - patient has decided that he does not want an EGD    A-fib  - on Toprol as outpatient - as his HR drops only when standing, will not d/c Toprol yet but will cut back from 25 to 12.5 - Eliquis has been held due syncope/frequent falls- follow up on this    Alzheimer's dementia  - he is quite oriented to person, place and situation today- he is aware that he is in the hospital due to passing out - holding Aricept (orthostatic hypotension)   Time spent in minutes: 45 DVT prophylaxis: Heparin Code Status: DNR Family Communication:  Disposition Plan:  Status is: Inpatient  Remains inpatient appropriate because:treating pneumonia and dehydration - Patient declining EGD after discussion with palliative care, he would like to be treated for his current issues and then return to SNF with hospice  Dispo: The patient is from: ALF              Anticipated d/c is to: TBD              Anticipated d/c date is: 3 days              Patient currently is not medically stable  to d/c.      Consultants:   Palliative care  GI Procedures:   2 d ECHO 1. Compared to previous echo from 10/01/19 ( under MRN [id number]) the RV  size and function are unchanged . He has cor pulmonale.  2. The aortic insufficiency is better visualized in previous echo and is  likely moderate - severe.  3. Left ventricular ejection fraction, by estimation, is 55 to 60%. The  left ventricle has normal function. The left ventricle has no regional  wall motion abnormalities. Left ventricular diastolic function could not  be evaluated. There is the  interventricular septum is flattened in diastole ('D' shaped left  ventricle), consistent with right ventricular volume overload.  4. Right ventricular systolic function is mildly reduced.  The right  ventricular size is severely enlarged. There is severely elevated  pulmonary artery systolic pressure. The estimated right ventricular  systolic pressure is 97.6 mmHg.  5. Left atrial size was mildly dilated.  6. Right atrial size was severely dilated.  7. The mitral valve is abnormal. Mild mitral valve regurgitation.  8. The tricuspid valve is degenerative. Tricuspid valve regurgitation is  moderate to severe.  9. The aortic valve is calcified. Aortic valve regurgitation is mild to  moderate. No aortic stenosis is present.  Antimicrobials:  Anti-infectives (From admission, onward)   Start     Dose/Rate Route Frequency Ordered Stop   12/21/19 1400  ampicillin-sulbactam (UNASYN) 1.5 g in sodium chloride 0.9 % 100 mL IVPB  Status:  Discontinued        1.5 g 200 mL/hr over 30 Minutes Intravenous Every 12 hours 12/21/19 1324 12/21/19 1330   12/21/19 1400  Ampicillin-Sulbactam (UNASYN) 3 g in sodium chloride 0.9 % 100 mL IVPB        3 g 200 mL/hr over 30 Minutes Intravenous Every 12 hours 12/21/19 1330     12/21/19 1145  vancomycin (VANCOCIN) IVPB 1000 mg/200 mL premix        1,000 mg 200 mL/hr over 60 Minutes Intravenous  Once 12/21/19 1131 12/21/19 1406   12/21/19 1145  piperacillin-tazobactam (ZOSYN) IVPB 3.375 g        3.375 g 100 mL/hr over 30 Minutes Intravenous  Once 12/21/19 1131 12/21/19 1241       Objective: Vitals:   12/22/19 2043 12/23/19 0134 12/23/19 0342 12/23/19 0815  BP: 127/69 (!) 121/59 (!) 126/57 (!) 120/58  Pulse: 61 64 67 (!) 59  Resp: 16 16 20 18   Temp: 97.7 F (36.5 C) 97.6 F (36.4 C) 99 F (37.2 C) 98.2 F (36.8 C)  TempSrc: Oral Oral Oral Oral  SpO2: 100% 93% 95% (!) 85%  Weight:   62.6 kg   Height:        Intake/Output Summary (Last 24 hours) at 12/23/2019 1105 Last data filed at 12/22/2019 1841 Gross per 24 hour  Intake 960.9 ml  Output 800 ml  Net 160.9 ml   Filed Weights   12/21/19 0920 12/22/19 0459 12/23/19 0342    Weight: 59 kg 61.7 kg 62.6 kg    Examination: General exam: This is a cachectic male lying in bed - appears comfortable HEENT: PERRLA, oral mucosa moist, no sclera icterus or thrush- very hard of hearing Respiratory system: Clear to auscultation. Respiratory effort normal. Cardiovascular system: S1 & S2 heard,  No murmurs  Gastrointestinal system: Abdomen soft, non-tender, nondistended. Normal bowel sounds  Central nervous system: Alert and oriented. No focal neurological deficits. Extremities: No cyanosis, clubbing or edema Skin:  numerous bruises and skin tears Psychiatry:  Mood & affect appropriate.     Data Reviewed: I have personally reviewed following labs and imaging studies  CBC: Recent Labs  Lab 12/21/19 0939 12/21/19 0945  WBC 13.8*  --   HGB 11.5* 12.6*  HCT 39.2 37.0*  MCV 95.8  --   PLT 210  --    Basic Metabolic Panel: Recent Labs  Lab 12/21/19 0939 12/21/19 0945 12/22/19 0352 12/23/19 0124  NA 143 144 143 143  K 3.7 3.7 4.2 5.0  CL 105 105 108 112*  CO2 25  --  26 24  GLUCOSE 111* 108* 88 114*  BUN 26* 28* 21 19  CREATININE 1.59* 1.50* 1.39* 1.31*  CALCIUM 9.2  --  9.1 9.1   GFR: Estimated Creatinine Clearance: 33.2 mL/min (A) (by C-G formula based on SCr of 1.31 mg/dL (H)). Liver Function Tests: Recent Labs  Lab 12/21/19 0939  AST 29  ALT 21  ALKPHOS 105  BILITOT 1.3*  PROT 6.0*  ALBUMIN 3.4*   No results for input(s): LIPASE, AMYLASE in the last 168 hours. No results for input(s): AMMONIA in the last 168 hours. Coagulation Profile: Recent Labs  Lab 12/21/19 0939  INR 1.5*   Cardiac Enzymes: No results for input(s): CKTOTAL, CKMB, CKMBINDEX, TROPONINI in the last 168 hours. BNP (last 3 results) No results for input(s): PROBNP in the last 8760 hours. HbA1C: No results for input(s): HGBA1C in the last 72 hours. CBG: Recent Labs  Lab 12/21/19 0923 12/22/19 0811 12/22/19 1618 12/23/19 0012  GLUCAP 102* 94 127* 99   Lipid  Profile: No results for input(s): CHOL, HDL, LDLCALC, TRIG, CHOLHDL, LDLDIRECT in the last 72 hours. Thyroid Function Tests: No results for input(s): TSH, T4TOTAL, FREET4, T3FREE, THYROIDAB in the last 72 hours. Anemia Panel: No results for input(s): VITAMINB12, FOLATE, FERRITIN, TIBC, IRON, RETICCTPCT in the last 72 hours. Urine analysis:    Component Value Date/Time   COLORURINE YELLOW 12/22/2019 0020   APPEARANCEUR CLEAR 12/22/2019 0020   LABSPEC 1.038 (H) 12/22/2019 0020   PHURINE 6.0 12/22/2019 0020   GLUCOSEU NEGATIVE 12/22/2019 0020   HGBUR NEGATIVE 12/22/2019 0020   BILIRUBINUR NEGATIVE 12/22/2019 0020   KETONESUR NEGATIVE 12/22/2019 0020   PROTEINUR 30 (A) 12/22/2019 0020   NITRITE NEGATIVE 12/22/2019 0020   LEUKOCYTESUR LARGE (A) 12/22/2019 0020   Sepsis Labs: @LABRCNTIP (procalcitonin:4,lacticidven:4) ) Recent Results (from the past 240 hour(s))  SARS Coronavirus 2 by RT PCR (hospital order, performed in [place] hospital lab) Nasopharyngeal Nasopharyngeal Swab     Status: None   Collection Time: 12/21/19  9:42 AM   Specimen: Nasopharyngeal Swab  Result Value Ref Range Status   SARS Coronavirus 2 NEGATIVE NEGATIVE Final    Comment: (NOTE) SARS-CoV-2 target nucleic acids are NOT DETECTED.  The SARS-CoV-2 RNA is generally detectable in upper and lower respiratory specimens during the acute phase of infection. The lowest concentration of SARS-CoV-2 viral copies this assay can detect is 250 copies / mL. A negative result does not preclude SARS-CoV-2 infection and should not be used as the sole basis for treatment or other patient management decisions.  A negative result may occur with improper specimen collection / handling, submission of specimen other than nasopharyngeal swab, presence of viral mutation(s) within the areas targeted by this assay, and inadequate number of viral copies (<250 copies / mL). A negative result must be combined with  clinical observations, patient history, and epidemiological information.  Fact Sheet for Patients:  StrictlyIdeas.no  Fact Sheet for Healthcare Providers: BankingDealers.co.za  This test is not yet approved or  cleared by the [place] FDA and has been authorized for detection and/or diagnosis of SARS-CoV-2 by FDA under an Emergency Use Authorization (EUA).  This EUA will remain in effect (meaning this test can be used) for the duration of the COVID-19 declaration under Section 564(b)(1) of the Act, 21 U.S.C. section 360bbb-3(b)(1), unless the authorization is terminated or revoked sooner.  Performed at [facility], [place] [address], [place], [place] [address]   Blood culture (routine x 2)     Status: None (Preliminary result)   Collection Time: 12/21/19 12:00 PM   Specimen: BLOOD RIGHT ARM  Result Value Ref Range Status   Specimen Description BLOOD RIGHT ARM  Final   Special Requests   Final    BOTTLES DRAWN AEROBIC AND ANAEROBIC Blood Culture results may not be optimal due to an inadequate volume of blood received in culture bottles   Culture   Final    NO GROWTH 1 DAY Performed at [facility], [place] [address] [place], [place] [address]    Report Status PENDING  Incomplete  Blood culture (routine x 2)     Status: None (Preliminary result)   Collection Time: 12/21/19 12:00 PM   Specimen: BLOOD LEFT ARM  Result Value Ref Range Status   Specimen Description BLOOD LEFT ARM  Final   Special Requests   Final    BOTTLES DRAWN AEROBIC AND ANAEROBIC Blood Culture results may not be optimal due to an inadequate volume of blood received in culture bottles   Culture   Final    NO GROWTH 1 DAY Performed at [facility], [place] [address], [place],  [address]    Report Status PENDING  Incomplete         Radiology Studies: ECHOCARDIOGRAM COMPLETE  Result Date: 12/21/2019    ECHOCARDIOGRAM REPORT   Patient  Name:   Jeremy Kelley Date of Exam: 12/21/2019 Medical Rec #:  [id number]        Height:       71.0 in Accession #:    [id number]       Weight:       130.0 lb Date of Birth:  10/14/1926       BSA:          1.756 m Patient Age:    32 years         BP:           121/55 mmHg Patient Gender: M                HR:           55 bpm. Exam Location:  Inpatient Procedure: 2D Echo Indications:    syncope 780.2  History:        Patient has no prior history of Echocardiogram examinations. No                 medical hx on file.  Sonographer:    Jannett Celestine RDCS (AE) Referring Phys: [id number] Lequita Halt  Sonographer Comments: Suboptimal apical window. Restricted mobility. IMPRESSIONS  1. Compared to previous echo from 10/01/19 ( under MRN [id number]) the RV size and function are unchanged . He has cor pulmonale.  2. The aortic insufficiency is better visualized in previous echo and is likely moderate - severe.  3. Left ventricular ejection fraction, by estimation, is 55 to 60%. The  left ventricle has normal function. The left ventricle has no regional wall motion abnormalities. Left ventricular diastolic function could not be evaluated. There is the interventricular septum is flattened in diastole ('D' shaped left ventricle), consistent with right ventricular volume overload.  4. Right ventricular systolic function is mildly reduced. The right ventricular size is severely enlarged. There is severely elevated pulmonary artery systolic pressure. The estimated right ventricular systolic pressure is 66.5 mmHg.  5. Left atrial size was mildly dilated.  6. Right atrial size was severely dilated.  7. The mitral valve is abnormal. Mild mitral valve regurgitation.  8. The tricuspid valve is degenerative. Tricuspid valve regurgitation is moderate to severe.  9. The aortic valve is calcified. Aortic valve regurgitation is mild to moderate. No aortic stenosis is present. FINDINGS  Left Ventricle: Left ventricular ejection fraction, by  estimation, is 55 to 60%. The left ventricle has normal function. The left ventricle has no regional wall motion abnormalities. The left ventricular internal cavity size was small. There is no left ventricular hypertrophy. The interventricular septum is flattened in diastole ('D' shaped left ventricle), consistent with right ventricular volume overload. Left ventricular diastolic function could not be evaluated due to atrial fibrillation. Left ventricular diastolic function could not be evaluated. Right Ventricle: The right ventricular size is severely enlarged. Right vetricular wall thickness was not assessed. Right ventricular systolic function is mildly reduced. There is severely elevated pulmonary artery systolic pressure. The tricuspid regurgitant velocity is 3.87 m/s, and with an assumed right atrial pressure of 15 mmHg, the estimated right ventricular systolic pressure is 99.3 mmHg. Left Atrium: Left atrial size was mildly dilated. Right Atrium: Right atrial size was severely dilated. Pericardium: There is no evidence of pericardial effusion. Mitral Valve: The mitral valve is abnormal. There is mild thickening of the mitral valve leaflet(s). Mild mitral valve regurgitation. Tricuspid Valve: The tricuspid valve is degenerative in appearance. Tricuspid valve regurgitation is moderate to severe. Aortic Valve: The aortic valve is calcified. Aortic valve regurgitation is mild to moderate. No aortic stenosis is present. Pulmonic Valve: The pulmonic valve was not well visualized. Pulmonic valve regurgitation is not visualized. Aorta: The aortic root and ascending aorta are structurally normal, with no evidence of dilitation. IAS/Shunts: The atrial septum is grossly normal.  LEFT VENTRICLE PLAX 2D LVIDd:         4.50 cm LVIDs:         2.60 cm LV PW:         1.00 cm LV IVS:        0.80 cm LVOT diam:     2.30 cm LV SV:         88 LV SV Index:   50 LVOT Area:     4.15 cm  RIGHT VENTRICLE RV S prime:     8.92 cm/s TAPSE  (M-mode): 1.6 cm LEFT ATRIUM         Index      RIGHT ATRIUM           Index LA diam:    3.70 cm 2.11 cm/m RA Area:     34.50 cm                                RA Volume:   139.00 ml 79.17 ml/m  AORTIC VALVE LVOT Vmax:   91.40 cm/s LVOT Vmean:  65.250 cm/s LVOT VTI:    0.213 m  AORTA Ao Root diam: 3.20  cm MITRAL VALVE                TRICUSPID VALVE MV Area (PHT): 5.13 cm     TR Peak grad:   59.9 mmHg MV Decel Time: 148 msec     TR Vmax:        387.00 cm/s MV E velocity: 122.00 cm/s                             SHUNTS                             Systemic VTI:  0.21 m                             Systemic Diam: 2.30 cm Mertie Moores MD Electronically signed by Mertie Moores MD Signature Date/Time: 12/21/2019/2:36:09 PM    Final       Scheduled Meds: . acidophilus  1 capsule Oral Daily  . B-complex with vitamin C  1 tablet Oral Daily  . cholecalciferol  1,000 Units Oral Daily  . colestipol  1 g Oral BID  . donepezil  10 mg Oral QHS  . heparin injection (subcutaneous)  5,000 Units Subcutaneous Q12H  . ipratropium  2 spray Each Nare TID  . loperamide  2 mg Oral TID  . mirabegron ER  50 mg Oral Daily  . olopatadine  1 drop Both Eyes BID  . polyvinyl alcohol  1 drop Both Eyes QHS  . psyllium  1 packet Oral Daily  . sodium chloride flush  3 mL Intravenous Q12H   Continuous Infusions: . ampicillin-sulbactam (UNASYN) IV 3 g (12/23/19 0249)     LOS: 2 days      Debbe Odea, MD [facility] Pager: [email or web address] 12/23/2019, 11:05 AM

## 2019-12-23 NOTE — Progress Notes (Addendum)
[facility] Documentation  Pt has been approved for hospice services upon dc to [facility].   Will order O2 if deemed necessary. Liaison to continue to follow in order to provide a safe discharge.   Please do not hesitate to outreach with any questions.   1530 Addendum: Writer spoke with [name] who requested hospital bed, geomat and O2 prn. DME to be delivered tomorrow.   Thank you,  Freddie Breech, RN  [facility] Liaison [phone]

## 2019-12-23 NOTE — Progress Notes (Signed)
Palliative care progress note  I received a call from Win and [name] (Mr. Souder HCPOA/surrogate decision makers) that Mr. Anctil has been stating that he does not want to get further heparin injections while he is in the hospital.  I talked with Mr. and Mrs. Alexander about the risk and benefit of anticoagulation in the hospital.  While they understand risk, the overall goal is to have as much quality of life for Mr. Docken as possible while working to transition back to assisted living with hospice support.  Following risk versus benefit discussion, they are in agreement that Mr. Adachi would not want anticoagulation in this setting as it requires him getting shots daily and overall goal is to make sure he is feeling as well as possible.    I discussed with him that I would discontinue heparin injections document reason for stopping anticoagulation.  Additionally, Mrs. Sheppard Coil believes he will need an electric hospital bed when he returns to assisted living facility.  I let her know that I would inform hospice liaison of additional DME needs.  I also reached out to hospice liaison as well as Dr. Wynelle Cleveland for coordination of care.  Micheline Rough, MD [facility] Team [phone]  NO CHARGE NOTE

## 2019-12-23 NOTE — Progress Notes (Signed)
Pt had order to transfer to cardiac tele yesterday on day shift for hypotension and bradycardia and unresponsiveness while orthostatic vitals were being attempted. Pt is now alert, normotensive, pulse in 60's. Pt being monitored on telemetry. Cardiac tele bed now available. Paged Jeannette Corpus NP to see if pt could stay on [facility].

## 2019-12-24 DIAGNOSIS — Z789 Other specified health status: Secondary | ICD-10-CM

## 2019-12-24 DIAGNOSIS — R1310 Dysphagia, unspecified: Secondary | ICD-10-CM

## 2019-12-24 DIAGNOSIS — J9601 Acute respiratory failure with hypoxia: Secondary | ICD-10-CM

## 2019-12-24 DIAGNOSIS — I4891 Unspecified atrial fibrillation: Secondary | ICD-10-CM

## 2019-12-24 DIAGNOSIS — J690 Pneumonitis due to inhalation of food and vomit: Principal | ICD-10-CM

## 2019-12-24 DIAGNOSIS — L8990 Pressure ulcer of unspecified site, unspecified stage: Secondary | ICD-10-CM | POA: Insufficient documentation

## 2019-12-24 LAB — CBC
HCT: 39.1 % (ref 39.0–52.0)
Hemoglobin: 11.6 g/dL — ABNORMAL LOW (ref 13.0–17.0)
MCH: 28.6 pg (ref 26.0–34.0)
MCHC: 29.7 g/dL — ABNORMAL LOW (ref 30.0–36.0)
MCV: 96.3 fL (ref 80.0–100.0)
Platelets: 180 10*3/uL (ref 150–400)
RBC: 4.06 MIL/uL — ABNORMAL LOW (ref 4.22–5.81)
RDW: 18.8 % — ABNORMAL HIGH (ref 11.5–15.5)
WBC: 8.6 10*3/uL (ref 4.0–10.5)
nRBC: 0 % (ref 0.0–0.2)

## 2019-12-24 LAB — BASIC METABOLIC PANEL
Anion gap: 8 (ref 5–15)
BUN: 16 mg/dL (ref 8–23)
CO2: 22 mmol/L (ref 22–32)
Calcium: 9.4 mg/dL (ref 8.9–10.3)
Chloride: 112 mmol/L — ABNORMAL HIGH (ref 98–111)
Creatinine, Ser: 1.24 mg/dL (ref 0.61–1.24)
GFR calc Af Amer: 59 mL/min — ABNORMAL LOW (ref >60)
GFR calc non Af Amer: 51 mL/min — ABNORMAL LOW (ref >60)
Glucose, Bld: 93 mg/dL (ref 70–99)
Potassium: 4.5 mmol/L (ref 3.5–5.1)
Sodium: 142 mmol/L (ref 135–145)

## 2019-12-24 LAB — GLUCOSE, CAPILLARY
Glucose-Capillary: 91 mg/dL (ref 70–99)
Glucose-Capillary: 114 mg/dL — ABNORMAL HIGH (ref 70–99)

## 2019-12-24 MED ORDER — ENSURE ENLIVE PO LIQD
237.0000 mL | Freq: Three times a day (TID) | ORAL | Status: DC
  Administered 2019-12-24 – 2019-12-26 (×4): 237 mL via ORAL

## 2019-12-24 MED ORDER — ADULT MULTIVITAMIN LIQUID CH
15.0000 mL | Freq: Every day | ORAL | Status: DC
  Administered 2019-12-24 – 2019-12-26 (×3): 15 mL via ORAL
  Filled 2019-12-24 (×4): qty 15

## 2019-12-24 NOTE — Progress Notes (Signed)
OT Cancellation Note  Patient Details Name: Jeremy Kelley MRN: [id number] DOB: 1926/07/06   Cancelled Treatment:    Reason Eval/Treat Not Completed: Other (comment). Per chart review, pt is now receiving hospice services/care. Acute OT needs no longer indicatated. OT to sign off.   Hortencia Pilar 12/24/2019, 9:36 AM

## 2019-12-24 NOTE — Progress Notes (Signed)
Manufacturing engineer [facility])  Hospital liaison spoke with POA [name] to continue education related to hospice philosophy and services and to answer any questions at this time. [name] verbalized understanding of information given.  Per discussion the plan is to discharge home tomorrow by PTAR. Pt has schedule admission visit for 2:30pm tomorrow.  [place] send signed and completed DNR home with pt/family.  Please provide prescriptions at discharge as needed to ensure ongoing symptom management until patient can be admitted onto hospice services.    DME delivery confirmed by this Probation officer with staff at [facility].   ACC information and contact numbers given to [place].  Above information shared with [place], [place].  Please call with any questions or concerns.  Thank you for the opportunity to participate in this pt's care.  Domenic Moras, BSN, RN [facility] [phone] (24h on call)
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
## 2019-12-24 NOTE — Progress Notes (Signed)
Pharmacy Antibiotic Note  Jeremy Kelley is a 86 y.o. male admitted on 12/21/2019 with possible aspiration pneumonia after a fall with AMS. Pharmacy has been consulted for ampicillin-sulbactam dosing.  Day #4 of Unasyn tx for aspiration, SCr improved 1.59>>1.24, WBC 13.8>8.6.    Plan: Increase Unasyn to 3g IV every 8 hours F/u clinical progression, LOT, consider completing 5d tx  Monitor renal function   Height: 5\' 11"  (180.3 cm) Weight: 61.7 kg (136 lb) IBW/kg (Calculated) : 75.3  Temp (24hrs), Avg:97.8 F (36.6 C), Min:97.4 F (36.3 C), Max:98.3 F (36.8 C)  Recent Labs  Lab 12/21/19 0939 12/21/19 0945 12/21/19 1000 12/22/19 0352 12/23/19 0124 12/24/19 0808  WBC 13.8*  --   --   --   --  8.6  CREATININE 1.59* 1.50*  --  1.39* 1.31* 1.24  LATICACIDVEN  --   --  3.0*  --   --   --     Estimated Creatinine Clearance: 34.6 mL/min (by C-G formula based on SCr of 1.24 mg/dL).    No Known Allergies  Antimicrobials this admission: 9/10 ampicillin-sulbactam >>   Microbiology results: 9/10 BCx: ngtd x 2  Bertis Ruddy, PharmD Clinical Pharmacist Please check AMION for all [place] numbers 12/24/2019 9:31 AM

## 2019-12-24 NOTE — Progress Notes (Signed)
  Speech Language Pathology Treatment: Dysphagia  Patient Details Name: Jeremy Kelley MRN: [id number] DOB: January 12, 1927 Today's Date: 12/24/2019 Time: [id number] SLP Time Calculation (min) (ACUTE ONLY): 13 min  Assessment / Plan / Recommendation Clinical Impression  Pt was seen with his family member at bedside who is also his POA. Tai, RN indicated that the pt exhibited coughing this morning with thin liquids. Pt reported that he has been consuming the current diet without significant difficulty and verbalized that he was on the diet due to "a problem with my esophagus". Pt consumed thin liquids via straw in a partially reclined position without overt s/sx of aspiration. Pt and his family member were educated regarding strategies to increase comfort while eating and both parties verbalized understanding. Pt has decided to be discharged with hospice care; considering goals of care, SLP services will be discontinued at this time.    HPI HPI: 86year old male admitted 12/21/19 from ALF after a fall and syncope. PMH: Alzheimer's dementia, chronic dysphagia, FTT, AFib, deterioration over the last year, palliative care on board. CTChest - achalasia, multifocal PNA, new plus old, R>L, likely aspiration      SLP Plan  Discharge SLP treatment due to (comment) (Pt will be discharged with comfort care)       Recommendations  Diet recommendations: Thin liquid (full liquids) Liquids provided via: Cup;Straw Medication Administration: Crushed with puree Supervision: Patient able to self feed Compensations: Slow rate;Small sips/bites                Oral Care Recommendations: Oral care QID Follow up Recommendations: None SLP Visit Diagnosis: Dysphagia, unspecified (R13.10) Plan: Discharge SLP treatment due to (comment) (Pt will be discharged with comfort care)       Tamora Huneke I. Hardin Negus, [place], [place] Office number [phone] Pager [place] 12/24/2019, 12:54 PM

## 2019-12-24 NOTE — Progress Notes (Signed)
PROGRESS NOTE    Jeremy Kelley  YNW:[id number] DOB: [birth year] DOA: 12/21/2019 PCP: [name], NP    Brief Narrative:  Patient admitted to the hospital with a working diagnosis of vasovagal/orthostatic syncope, complicated by aspiration pneumonia.  86 year old male with past medical history for Alzheimer's dementia, chronic dysphagia, chronic atrial fibrillation and failure to thrive.  He sustained a syncope/and a fall at home.  Over the last year his overall health has been declining, he is under palliative care services.  He had multiple falls, including hip fracture January this year.  Apparently patient fall from the toilet seat, he was trying to stand out when he lost consciousness for a brief period of time.  On his initial physical examination his blood pressure was 127/68, heart rate 55, respiratory rate 31, oxygen saturation 95%.  He had dry mucous membranes, his lungs were clear to auscultation bilaterally, heart S1-S2, present rhythmic, abdomen was soft and nontender, he had no lower extremity edema.  He had bruises and tears in the skin left elbow/left knee. Sodium 143, potassium 3.7, chloride 105, bicarb 25, glucose 111, BUN 26, creatinine 1.59, AST 29, LT 21, lactic acid 3.0, white count 13.8, hemoglobin 9.5, hematocrit 39.2, platelets 210.  SARS COVID-19 was negative.  Urinalysis specific gravity 1.038, 30 protein, more than 50 white cells.  Alcohol level less than 10. CT head, cervical spine negative for acute changes.  CT chest/ abdomen/ pelvis with patchy bilateral infiltrates, bilateral loculated pleural fluid and patulous appearance of the esophagus, possible achalasia. Chest radiograph had cardiomegaly, bilateral infiltrates at bases, more prominent at the right side, interstitial and alveolar component. EKG 60 bpm, left axis deviation, normal QTC, atrial fibrillation rhythm, no ST segment or T wave changes, poor R wave progression.  Patient has received volume  resuscitation, with persistent orthostatic symptoms.  He has received antibiotic therapy for aspiration pneumonia.  His echocardiography shows mild to moderate aortic regurgitation. GI was consulted for possible esophageal stricture, he declined further invasive interventions.  Palliative care services have been consulted.  1. Vasovagal/ orthostatic syncope. Patient had syncope episode today while in the commode, his HR went down to th 30's and loss consciousness. Patient recovered rapidly, with no chest pain or dyspnea.  Telemetry continue with atrial fibrillation and slow rate, episodic wide complex tachycardia, irregularly irregular, possible aberrancy. LV systolic function with 55 to 60%, decreased RV systolic function with flattened D shape septum on diastole.    Continue IV fluids for now and will discontinue AV blockers, continue close telemetry monitoring.   2. Acute hypoxemic respiratory failure/ chronic core pulmonale, COPD with sings of trapped lung with chronic pleural effusions. Suspected chronic aspiration, continue with aspiration precautions and diet with this liquids per speech recommendations.  Patient has declined further GI evaluation, follow with nutrition recommendations.   Continue antibiotic therapy with Unasyn to complete 8 days.   3. Chronic atrial fibrillation. Due to bradycardia and syncope will hold on B blocker for now and will continue telemetry monitoring. Holding anticoagulation due to fall risk.   4. CKD stage 3. Continue close follow up on renal function and electrolytes. Stable renal function with serum cr at 1,24 with K at 4,5 and serum bicarbonate at 22.   5. Dementia Alzheimer's/ severe calorie protein malnutrition/ failure to thrive./ stage 1 coccyx pressure ulcer/ left elbow stage 2 (present on admission). Continue neuro checks, aspiration and fall precautions. Follow with palliative care recommendations.  Continue with skin care.   On donepezil.  6.  Positive pyuria but no urinary symptoms, ruled out UTI.   Assessment & Plan:   Principal Problem:   Syncope Active Problems:   Failure to thrive in adult   Acute hypoxemic respiratory failure (HCC)   Aspiration pneumonia of both lungs (HCC)   Generalized weakness   Severe protein-calorie malnutrition (HCC)   A-fib (HCC)   Alzheimer's dementia ([place])   Advanced directive placed in chart this admission   DNR (do not resuscitate)   DNI (do not intubate)   Pressure injury of skin     Patient continue to be at high risk for high risk for recurrent syncope.   Status is: Inpatient  Remains inpatient appropriate because:IV treatments appropriate due to intensity of illness or inability to take PO   Dispo: The patient is from: Home              Anticipated d/c is to: SNF              Anticipated d/c date is: 3 days              Patient currently is not medically stable to d/c.   DVT prophylaxis: Enoxaparin   Code Status:   dnr   Family Communication:  No family at the bedside      Nutrition Status:           Skin Documentation: Pressure Injury 12/22/19 Coccyx Stage 1 -  Intact skin with non-blanchable redness of a localized area usually over a bony prominence. (Active)  12/22/19 2034  Location: Coccyx  Location Orientation:   Staging: Stage 1 -  Intact skin with non-blanchable redness of a localized area usually over a bony prominence.  Wound Description (Comments):   Present on Admission:      Consultants:   Palliative care  GI   Procedures:     Antimicrobials:   Unasyn     Subjective: Patient had a syncope episode today when seating at the bedside commode, at the time of my examination he recovered consciousness, no chest pain or dyspnea, no nausea or vomiting.   Objective: Vitals:   12/24/19 0344 12/24/19 0500 12/24/19 0721 12/24/19 0744  BP: (!) 145/75  129/71 140/75  Pulse: 68  68 67  Resp: 20  18 16   Temp: (!) 97.4 F (36.3 C)   98.3 F  (36.8 C)  TempSrc: Oral     SpO2: 92%  91% 92%  Weight:  61.7 kg    Height:        Intake/Output Summary (Last 24 hours) at 12/24/2019 0916 Last data filed at 12/24/2019 0031 Gross per 24 hour  Intake 480 ml  Output 250 ml  Net 230 ml   Filed Weights   12/22/19 0459 12/23/19 0342 12/24/19 0500  Weight: 61.7 kg 62.6 kg 61.7 kg    Examination:   General: Not in pain. Mild dyspnea at rest.  Neurology: Awake and alert, non focal  E ENT: no pallor, no icterus, oral mucosa moist Cardiovascular: No JVD. S1-S2 present, rhythmic, no gallops, rubs, or murmurs. No lower extremity edema. Pulmonary: positive breath sounds bilaterally, decreased air movement, no wheezing, rhonchi or rales. Gastrointestinal. Abdomen soft and non tender Skin. Stage 1 coccyx  and stage 2 at the left elbow.  Musculoskeletal: no joint deformities     Data Reviewed: I have personally reviewed following labs and imaging studies  CBC: Recent Labs  Lab 12/21/19 0939 12/21/19 0945 12/24/19 0808  WBC 13.8*  --  8.6  HGB 11.5* 12.6* 11.6*  HCT 39.2 37.0* 39.1  MCV 95.8  --  96.3  PLT 210  --  482   Basic Metabolic Panel: Recent Labs  Lab 12/21/19 0939 12/21/19 0945 12/22/19 0352 12/23/19 0124  NA 143 144 143 143  K 3.7 3.7 4.2 5.0  CL 105 105 108 112*  CO2 25  --  26 24  GLUCOSE 111* 108* 88 114*  BUN 26* 28* 21 19  CREATININE 1.59* 1.50* 1.39* 1.31*  CALCIUM 9.2  --  9.1 9.1   GFR: Estimated Creatinine Clearance: 32.7 mL/min (A) (by C-G formula based on SCr of 1.31 mg/dL (H)). Liver Function Tests: Recent Labs  Lab 12/21/19 0939  AST 29  ALT 21  ALKPHOS 105  BILITOT 1.3*  PROT 6.0*  ALBUMIN 3.4*   No results for input(s): LIPASE, AMYLASE in the last 168 hours. No results for input(s): AMMONIA in the last 168 hours. Coagulation Profile: Recent Labs  Lab 12/21/19 0939  INR 1.5*   Cardiac Enzymes: No results for input(s): CKTOTAL, CKMB, CKMBINDEX, TROPONINI in the last 168  hours. BNP (last 3 results) No results for input(s): PROBNP in the last 8760 hours. HbA1C: No results for input(s): HGBA1C in the last 72 hours. CBG: Recent Labs  Lab 12/23/19 0012 12/23/19 1101 12/23/19 1639 12/23/19 2139 12/24/19 0623  GLUCAP 99 92 104* 101* 91   Lipid Profile: No results for input(s): CHOL, HDL, LDLCALC, TRIG, CHOLHDL, LDLDIRECT in the last 72 hours. Thyroid Function Tests: No results for input(s): TSH, T4TOTAL, FREET4, T3FREE, THYROIDAB in the last 72 hours. Anemia Panel: No results for input(s): VITAMINB12, FOLATE, FERRITIN, TIBC, IRON, RETICCTPCT in the last 72 hours.    Radiology Studies: I have reviewed all of the imaging during this hospital visit personally     Scheduled Meds: . acidophilus  1 capsule Oral Daily  . B-complex with vitamin C  1 tablet Oral Daily  . cholecalciferol  1,000 Units Oral Daily  . colestipol  1 g Oral BID  . donepezil  10 mg Oral QHS  . ipratropium  2 spray Each Nare TID  . loperamide  2 mg Oral TID  . mirabegron ER  50 mg Oral Daily  . olopatadine  1 drop Both Eyes BID  . polyvinyl alcohol  1 drop Both Eyes QHS  . psyllium  1 packet Oral Daily  . sodium chloride flush  3 mL Intravenous Q12H   Continuous Infusions: . sodium chloride 50 mL/hr at 12/23/19 1603  . ampicillin-sulbactam (UNASYN) IV 3 g (12/24/19 0257)     LOS: 3 days        Shirl Weir Gerome Apley, MD

## 2019-12-24 NOTE — Progress Notes (Signed)
Daily Rounding Note  12/24/2019, 8:56 AM  LOS: 3 days   SUBJECTIVE:   Chief complaint:  dysphagia   Pt wants no interventions, he is declining SQ Heparin.  Hospice will be coming on board for comfort care.   Had at least 2 episodes over weekend w long cardiac pauses na hypotension, unresponsiveness  OBJECTIVE:         Vital signs in last 24 hours:    Temp:  [97.4 F (36.3 C)-98.3 F (36.8 C)] 98.3 F (36.8 C) (09/13 0744) Pulse Rate:  [67-74] 67 (09/13 0744) Resp:  [16-20] 16 (09/13 0744) BP: (129-145)/(65-75) 140/75 (09/13 0744) SpO2:  [88 %-94 %] 92 % (09/13 0744) Weight:  [61.7 kg] 61.7 kg (09/13 0500) Last BM Date: 12/23/19 Filed Weights   12/22/19 0459 12/23/19 0342 12/24/19 0500  Weight: 61.7 kg 62.6 kg 61.7 kg   General: frail, alert, able to express himself.  Looks frail and ill w ronchorous cough.        Intake/Output from previous day: 09/12 0701 - 09/13 0700 In: 720 [P.O.:720] Out: 250 [Urine:250]  Intake/Output this shift: No intake/output data recorded.  Lab Results: Recent Labs    12/21/19 0939 12/21/19 0945 12/24/19 0808  WBC 13.8*  --  8.6  HGB 11.5* 12.6* 11.6*  HCT 39.2 37.0* 39.1  PLT 210  --  180   BMET Recent Labs    12/21/19 0939 12/21/19 0939 12/21/19 0945 12/22/19 0352 12/23/19 0124  NA 143   < > 144 143 143  K 3.7   < > 3.7 4.2 5.0  CL 105   < > 105 108 112*  CO2 25  --   --  26 24  GLUCOSE 111*   < > 108* 88 114*  BUN 26*   < > 28* 21 19  CREATININE 1.59*   < > 1.50* 1.39* 1.31*  CALCIUM 9.2  --   --  9.1 9.1   < > = values in this interval not displayed.   LFT Recent Labs    12/21/19 0939  PROT 6.0*  ALBUMIN 3.4*  AST 29  ALT 21  ALKPHOS 105  BILITOT 1.3*   PT/INR Recent Labs    12/21/19 0939  LABPROT 17.6*  INR 1.5*   Hepatitis Panel No results for input(s): HEPBSAG, HCVAB, HEPAIGM, HEPBIGM in the last 72 hours.  Studies/Results: No results  found.   Scheduled Meds: . acidophilus  1 capsule Oral Daily  . B-complex with vitamin C  1 tablet Oral Daily  . cholecalciferol  1,000 Units Oral Daily  . colestipol  1 g Oral BID  . donepezil  10 mg Oral QHS  . ipratropium  2 spray Each Nare TID  . loperamide  2 mg Oral TID  . mirabegron ER  50 mg Oral Daily  . olopatadine  1 drop Both Eyes BID  . polyvinyl alcohol  1 drop Both Eyes QHS  . psyllium  1 packet Oral Daily  . sodium chloride flush  3 mL Intravenous Q12H   Continuous Infusions: . sodium chloride 50 mL/hr at 12/23/19 1603  . ampicillin-sulbactam (UNASYN) IV 3 g (12/24/19 0257)   PRN Meds:.acetaminophen, loperamide   ASSESMENT:   *  Oropharyngeal dysphagia.  Dilated esophagus, concern for achalasia per CT.  Pt declines any endoscopic therapy  *   Aspiration PNA.  multilobar w Acute resp failure.  Unasyn in place.      *  Dementia.    *   Afib.  Eliquis on hold.    *   AKI.  Creat 1.5 >> 1.3.    *   Severe cardiomegaly.  Cor pulmonale.  LVEF 55 to 60%. Mild reduction RV fx.      *   DNR, hospice will be coming on board for comfort care per d/w pt's HCPOA.     PLAN   *   No plans for egd/botox, pt does not want any interventions.  GI signing off.      Jeremy Kelley  12/24/2019, 8:56 AM Phone [phone]

## 2019-12-24 NOTE — Progress Notes (Signed)
Patient became unresponsive while on bedside commode, HR down in 36-40, for couple minute MD and RR  notified , MD came to see the patient. Patient back to the baseline alert  and oriented. V/s stable.no new order given will continue to monitor the patient

## 2019-12-24 NOTE — Progress Notes (Signed)
Central monitor called that 3e30 had 18 beats of VT, pt. Denies pain, V/S stable. MD notified. Will continue to monitor the patient.

## 2019-12-25 DIAGNOSIS — G309 Alzheimer's disease, unspecified: Secondary | ICD-10-CM

## 2019-12-25 DIAGNOSIS — Z66 Do not resuscitate: Secondary | ICD-10-CM

## 2019-12-25 DIAGNOSIS — L89301 Pressure ulcer of unspecified buttock, stage 1: Secondary | ICD-10-CM

## 2019-12-25 DIAGNOSIS — F0280 Dementia in other diseases classified elsewhere without behavioral disturbance: Secondary | ICD-10-CM

## 2019-12-25 DIAGNOSIS — R531 Weakness: Secondary | ICD-10-CM

## 2019-12-25 LAB — BASIC METABOLIC PANEL
Anion gap: 7 (ref 5–15)
BUN: 17 mg/dL (ref 8–23)
CO2: 23 mmol/L (ref 22–32)
Calcium: 9.4 mg/dL (ref 8.9–10.3)
Chloride: 110 mmol/L (ref 98–111)
Creatinine, Ser: 1.18 mg/dL (ref 0.61–1.24)
GFR calc Af Amer: >60 mL/min (ref >60)
GFR calc non Af Amer: 54 mL/min — ABNORMAL LOW (ref >60)
Glucose, Bld: 110 mg/dL — ABNORMAL HIGH (ref 70–99)
Potassium: 4.5 mmol/L (ref 3.5–5.1)
Sodium: 140 mmol/L (ref 135–145)

## 2019-12-25 LAB — GLUCOSE, CAPILLARY
Glucose-Capillary: 97 mg/dL (ref 70–99)
Glucose-Capillary: 106 mg/dL — ABNORMAL HIGH (ref 70–99)
Glucose-Capillary: 113 mg/dL — ABNORMAL HIGH (ref 70–99)

## 2019-12-25 LAB — MAGNESIUM: Magnesium: 2 mg/dL (ref 1.7–2.4)

## 2019-12-25 MED ORDER — AMOXICILLIN-POT CLAVULANATE 875-125 MG PO TABS
1.0000 | ORAL_TABLET | Freq: Two times a day (BID) | ORAL | Status: DC
  Administered 2019-12-25 – 2019-12-26 (×4): 1 via ORAL
  Filled 2019-12-25 (×5): qty 1

## 2019-12-25 MED ORDER — AMOXICILLIN-POT CLAVULANATE 875-125 MG PO TABS: 1.0000 | ORAL_TABLET | Freq: Two times a day (BID) | ORAL | 0 refills | Status: AC

## 2019-12-25 MED ORDER — ENSURE ENLIVE PO LIQD: 237.0000 mL | Freq: Three times a day (TID) | ORAL | 0 refills | Status: AC

## 2019-12-25 MED ORDER — ADULT MULTIVITAMIN LIQUID CH: 15.0000 mL | Freq: Every day | ORAL | 0 refills | Status: AC

## 2019-12-25 NOTE — Progress Notes (Signed)
PT Cancellation Note  Patient Details Name: Jeremy Kelley MRN: [id number] DOB: 28-Jul-1926   Cancelled Treatment:    Reason Eval/Treat Not Completed: Medical issues which prohibited therapy;Other (comment) Noted plan to d/c to ALF with hospice.  Spoke with RN who reports pt had another syncopal episode yesterday with low HR.  She reports pt request just to rest at this time.  Will hold PT today.    Abran Richard, PT Acute Rehab Services Pager [phone] [facility] 12/25/2019, 12:58 PM

## 2019-12-25 NOTE — Discharge Summary (Addendum)
Physician Discharge Summary  Jeremy Kelley XBD:[id number] DOB: 1927/03/16 DOA: 12/21/2019  PCP: [name], NP  Admit date: 12/21/2019 Discharge date: 12/27/2019  Admitted From: ALF  Disposition:   ALF with hospice.   Recommendations for Outpatient Follow-up and new medication changes:  1. Follow up with [name] in 7 days.  2. Discontinue beta blocker and torsemide 3. Continue aspiration precautions.  4. Continue antibiotic therapy with Augmentin until 12/30/19.  OK to have condom cath at ALF  Patient to be discharged to ALF with hospice services [place]: hospice services   Equipment/Devices: na    Discharge Condition: stable  CODE STATUS: DNR   Diet recommendation: full liquid diet.   Brief/Interim Summary: Patient admitted to the hospital with a working diagnosis of vasovagal/orthostatic syncope, complicated by aspiration pneumonia.  86 year old male with past medical history for Alzheimer's dementia, chronic dysphagia, chronic atrial fibrillation and failure to thrive. He sustained a syncope/and a fall at home. Over the last year his overall health has been declining, he is under palliative care services. He had multiple falls, including hip fracture January this year. Apparently patient fall from the toilet seat, he was trying to stand out when he lost consciousness for a brief period of time. On his initial physical examination his blood pressure was 127/68, heart rate 55, respiratory rate 31, oxygen saturation 95%. He had dry mucous membranes, his lungs were clear to auscultation bilaterally, heart S1-S2, present rhythmic, abdomen was soft and nontender, he had no lower extremity edema. He had bruises and tears in the skin left elbow/left knee. Sodium 143, potassium 3.7, chloride 105, bicarb 25, glucose 111, BUN 26, creatinine 1.59, AST 29, LT 21, lactic acid 3.0, white count 13.8, hemoglobin 9.5, hematocrit 39.2, platelets 210. SARS COVID-19  was negative. Urinalysis specific gravity 1.038,30 protein, more than 50 white cells. Alcohol level less than 10. CT head, cervical spine negative for acute changes. CT chest/ abdomen/ pelviswith patchy bilateral infiltrates, bilateralloculated pleural fluid andpatulous appearance of the esophagus, possible achalasia. Chest radiograph had cardiomegaly, bilateral infiltrates at bases, more prominent at the right side, interstitial and alveolar component. EKG 60 bpm, left axis deviation, normal QTC, atrial fibrillation rhythm, no ST segment or T wave changes, poor R wave progression.  Patient has received volume resuscitation, with persistent orthostatic symptoms. He has received antibiotic therapy for aspiration pneumonia.His echocardiography shows mild to moderateaortic regurgitation. GI was consulted for possible esophageal stricture, he declined further invasive interventions.  Palliative care services have been consulted.  Patient had syncope episode 12/24/19 while in the commode, his HR went down to th 30's and loss consciousness. Patient recovered rapidly, with no chest pain or dyspnea.    1. Vasovagal/ orthostatic syncope. Telemetry continue with atrial fibrillation rate in the 60, no further bradycardic episodes, continue to have episodic wide complex tachycardia, irregularly irregular, possible aberrancy. Systolic blood pressure 834 to 130 mmHg.   AV blockers have been discontinued with good toleration. No further episodes of syncope.   2. Acute hypoxemic respiratory failure/ chronic core pulmonale, COPD with sings of trapped lung with chronic pleural effusions.  LV systolic function with 55 to 60%, decreased RV systolic function with flattened D shape septum on diastole.  Patient with chronic aspiration, has declined endoscopic procedures.   Continue nutritional support and aspiration precautions. Antibiotic therapy started with Unasyn inpatient and will transition to  oral Augmentin to complete 8 days.   3. Chronic atrial fibrillation. Persistent atrial fibrillation but heart rate is controlled,  episodic wide complex tachycardia with irregularly irregular, self resolved.   Holding anticoagulation due to fall and bleeding risk. Off AV blocker due to bradycardia and recurrent vasovagal syncope.   4. CKD stage 3. Stable renal function with serum cr at 1,18 with K at 4,5 and serum bicarbonate at 23. Mg at 2.0 Discontinue IV fluids and follow up on renal panel in am. Plan to keep Mg at 2 and K 4.0  5. Dementia Alzheimer's/ severe calorie protein malnutrition/ failure to thrive./ stage 1 coccyx pressure ulcer/ left elbow stage 2 (present on admission). No confusion or agitation, continue to be very weak and deconditioned, he wants to return to assisted living facility when medically stable. Continue local skin care.   Continue with donepezil.   OK to have condom cath at ALF  Discharge Diagnoses:  Principal Problem:   Syncope Active Problems:   Failure to thrive in adult   Acute hypoxemic respiratory failure (HCC)   Aspiration pneumonia of both lungs (HCC)   Generalized weakness   Severe protein-calorie malnutrition ([place])   A-fib (HCC)   Alzheimer's dementia ([place])   Advanced directive placed in chart this admission   DNR (do not resuscitate)   DNI (do not intubate)   Pressure injury of skin   Dysphagia    Discharge Instructions   Allergies as of 12/25/2019   No Known Allergies     Medication List    STOP taking these medications   Eliquis 2.5 MG Tabs tablet Generic drug: apixaban   metoprolol succinate 25 MG 24 hr tablet Commonly known as: TOPROL-XL   potassium chloride 10 MEQ tablet Commonly known as: KLOR-CON   torsemide 20 MG tablet Commonly known as: DEMADEX   VIRT-PHOS 250 NEUTRAL PO     TAKE these medications   acetaminophen 325 MG tablet Commonly known as: TYLENOL Take 650 mg by mouth every 6 (six) hours as needed  for mild pain.   amoxicillin-clavulanate 875-125 MG tablet Commonly known as: AUGMENTIN Take 1 tablet by mouth every 12 (twelve) hours for 5 days.   cholecalciferol 25 MCG (1000 UNIT) tablet Commonly known as: VITAMIN D3 Take 1,000 Units by mouth daily.   colestipol 1 g tablet Commonly known as: COLESTID Take 1 g by mouth 2 (two) times daily.   donepezil 10 MG tablet Commonly known as: ARICEPT Take 10 mg by mouth at bedtime.   feeding supplement (ENSURE ENLIVE) Liqd Take 237 mLs by mouth 3 (three) times daily between meals.   Fiber 28.3 % Powd Take 30 mLs by mouth in the morning and at bedtime. Mix with suitable liquid   FLORAJEN DIGESTION PO Take 1 capsule by mouth daily.   ipratropium 0.06 % nasal spray Commonly known as: ATROVENT Place 2 sprays into both nostrils 2 (two) times daily as needed for congestion.   liver oil-zinc oxide 40 % ointment Commonly known as: DESITIN Apply 1 application topically at bedtime. Apply to coccyx/sacrum area   loperamide 2 MG capsule Commonly known as: IMODIUM Take 2 mg by mouth 4 (four) times daily as needed for diarrhea or loose stools.   loperamide 2 MG capsule Commonly known as: IMODIUM Take 2 mg by mouth 3 (three) times daily.   multivitamin Liqd Take 15 mLs by mouth daily.   Myrbetriq 50 MG Tb24 tablet Generic drug: mirabegron ER Take 50 mg by mouth daily.   NONFORMULARY OR COMPOUNDED ITEM Apply 1 application topically at bedtime. Apply to affected area(s)  TAC 0.1% lotion/eucerin 1:1  Olopatadine HCl 0.2 % Soln Place 1 drop into both eyes daily. Itching/allergy   Systane 0.4-0.3 % Gel ophthalmic gel Generic drug: Polyethyl Glycol-Propyl Glycol Place 1 application into both eyes at bedtime.   Vitamin B Complex Tabs Take 1 tablet by mouth daily.       No Known Allergies  Consultations:  Palliative Care    Procedures/Studies: DG Elbow Complete Left  Result Date: 12/21/2019 CLINICAL DATA:  Fall with  skin avulsion.  Elbow pain EXAM: LEFT ELBOW - COMPLETE 3+ VIEW COMPARISON:  None. FINDINGS: There is no evidence of fracture, dislocation, or joint effusion. Generalized osteopenia. No imbedded opaque foreign body suspected. IMPRESSION: Negative for fracture. Electronically Signed   By: Monte Fantasia M.D.   On: 12/21/2019 10:48   CT HEAD WO CONTRAST  Result Date: 12/21/2019 CLINICAL DATA:  Neck trauma EXAM: CT HEAD WITHOUT CONTRAST CT CERVICAL SPINE WITHOUT CONTRAST TECHNIQUE: Multidetector CT imaging of the head and cervical spine was performed following the standard protocol without intravenous contrast. Multiplanar CT image reconstructions of the cervical spine were also generated. COMPARISON:  March 25, 2019 FINDINGS: CT HEAD FINDINGS Brain: No evidence of acute infarction, hemorrhage, hydrocephalus, extra-axial collection or mass lesion/mass effect. Signs of chronic microvascular ischemic change and atrophy as before. Vascular: No hyperdense vessel or unexpected calcification. Skull: Normal. Negative for fracture or focal lesion. Sinuses/Orbits: Mucous retention cysts or polyps in the LEFT maxillary and LEFT ethmoid sinuses unchanged from previous imaging. Post bilateral maxillary antrostomy. Other: None. CT CERVICAL SPINE FINDINGS Alignment: Reversal of normal cervical lordosis in the upper and mid cervical spine. This is seen on a background of multilevel degenerative change. Calcified disc material at C2-3 extends posteriorly. The alignment is unchanged compared to a radiograph from 2017 with mild, approximately 2 mm anterolisthesis of C3 on C4. Skull base and vertebrae: No acute fracture. No primary bone lesion or focal pathologic process. Soft tissues and spinal canal: No prevertebral fluid or swelling. No visible canal hematoma. Disc levels: Multilevel spinal degenerative changes with mild anterolisthesis of C3 on C4. Calcified area extending posteriorly from the disc space likely calcified disc  material with mild narrowing of the canal. Alignment unchanged from previous exam, plain film evaluation of 2017. There is also mild anterolisthesis of C4 on C5. Multilevel disc space narrowing greatest at C4-5, C5-6, C6-7 and C7-T1. Facet arthropathy greatest at C3-4 and C4-5. Upper chest: Emphysematous changes and patchy bilateral areas of airspace disease with nodular component. Loculated pleural fluid in the LEFT chest partially visualized. Marked patulous appearance of the esophagus. Other: None IMPRESSION: 1. No acute intracranial abnormality. 2. No evidence for acute fracture or traumatic malalignment in the cervical spine. 3. Multilevel spinal degenerative changes of the cervical spine as described. 4. Emphysematous changes and patchy bilateral areas of airspace disease with nodular component. Findings likely reflect multifocal pneumonia and/or aspiration related changes, see dedicated CT of the chest. 5. Loculated pleural fluid may be due to above findings. 6. Marked patulous appearance of the esophagus, configuration could be seen in the setting of achalasia and could contribute to aspiration risk in this patient who is shown to aspirate on previous barium evaluation. 7. Emphysema. Emphysema (ICD10-J43.9). Electronically Signed   By: Zetta Bills M.D.   On: 12/21/2019 11:24   CT Cervical Spine Wo Contrast  Result Date: 12/21/2019 CLINICAL DATA:  Neck trauma EXAM: CT HEAD WITHOUT CONTRAST CT CERVICAL SPINE WITHOUT CONTRAST TECHNIQUE: Multidetector CT imaging of the head and cervical spine was performed following the  standard protocol without intravenous contrast. Multiplanar CT image reconstructions of the cervical spine were also generated. COMPARISON:  March 25, 2019 FINDINGS: CT HEAD FINDINGS Brain: No evidence of acute infarction, hemorrhage, hydrocephalus, extra-axial collection or mass lesion/mass effect. Signs of chronic microvascular ischemic change and atrophy as before. Vascular: No  hyperdense vessel or unexpected calcification. Skull: Normal. Negative for fracture or focal lesion. Sinuses/Orbits: Mucous retention cysts or polyps in the LEFT maxillary and LEFT ethmoid sinuses unchanged from previous imaging. Post bilateral maxillary antrostomy. Other: None. CT CERVICAL SPINE FINDINGS Alignment: Reversal of normal cervical lordosis in the upper and mid cervical spine. This is seen on a background of multilevel degenerative change. Calcified disc material at C2-3 extends posteriorly. The alignment is unchanged compared to a radiograph from 2017 with mild, approximately 2 mm anterolisthesis of C3 on C4. Skull base and vertebrae: No acute fracture. No primary bone lesion or focal pathologic process. Soft tissues and spinal canal: No prevertebral fluid or swelling. No visible canal hematoma. Disc levels: Multilevel spinal degenerative changes with mild anterolisthesis of C3 on C4. Calcified area extending posteriorly from the disc space likely calcified disc material with mild narrowing of the canal. Alignment unchanged from previous exam, plain film evaluation of 2017. There is also mild anterolisthesis of C4 on C5. Multilevel disc space narrowing greatest at C4-5, C5-6, C6-7 and C7-T1. Facet arthropathy greatest at C3-4 and C4-5. Upper chest: Emphysematous changes and patchy bilateral areas of airspace disease with nodular component. Loculated pleural fluid in the LEFT chest partially visualized. Marked patulous appearance of the esophagus. Other: None IMPRESSION: 1. No acute intracranial abnormality. 2. No evidence for acute fracture or traumatic malalignment in the cervical spine. 3. Multilevel spinal degenerative changes of the cervical spine as described. 4. Emphysematous changes and patchy bilateral areas of airspace disease with nodular component. Findings likely reflect multifocal pneumonia and/or aspiration related changes, see dedicated CT of the chest. 5. Loculated pleural fluid may be  due to above findings. 6. Marked patulous appearance of the esophagus, configuration could be seen in the setting of achalasia and could contribute to aspiration risk in this patient who is shown to aspirate on previous barium evaluation. 7. Emphysema. Emphysema (ICD10-J43.9). Electronically Signed   By: Zetta Bills M.D.   On: 12/21/2019 11:24   DG Pelvis Portable  Result Date: 12/21/2019 CLINICAL DATA:  Ground level fall EXAM: PORTABLE PELVIS 1-2 VIEWS COMPARISON:  05/03/2019 FINDINGS: Diffuse osseous demineralization. Partially visualized bilateral hip arthroplasties. No evidence of acute fracture, dislocation, or pelvic diastasis. IMPRESSION: Negative. Electronically Signed   By: Davina Poke D.O.   On: 12/21/2019 10:00   CT CHEST ABDOMEN PELVIS W CONTRAST  Result Date: 12/21/2019 CLINICAL DATA:  86 year old male with history of trauma from a fall. Abnormal chest x-ray. EXAM: CT CHEST, ABDOMEN, AND PELVIS WITH CONTRAST TECHNIQUE: Multidetector CT imaging of the chest, abdomen and pelvis was performed following the standard protocol during bolus administration of intravenous contrast. CONTRAST:  162mL OMNIPAQUE IOHEXOL 300 MG/ML  SOLN COMPARISON:  CT the abdomen and pelvis 02/21/2019. No prior chest CT. FINDINGS: CT CHEST FINDINGS Cardiovascular: Heart size is severely enlarged with right ventricular and severe right atrial dilatation. There is no significant pericardial fluid, thickening or pericardial calcification. There is aortic atherosclerosis, as well as atherosclerosis of the great vessels of the mediastinum and the coronary arteries, including calcified atherosclerotic plaque in the left anterior descending and left circumflex coronary arteries. Thickening calcification of the aortic valve. Mediastinum/Nodes: No pathologically enlarged mediastinal or  hilar lymph nodes. Severely dilated esophagus. No axillary lymphadenopathy. Lungs/Pleura: No pneumothorax. Patchy areas of ground-glass  attenuation and airspace consolidation are scattered throughout the lungs bilaterally (right greater than left), most confluent in the right middle lobe and right lower lobe, compatible with multilobar pneumonia. Small bilateral pleural effusions, some of which is partially loculated within the major fissures bilaterally. Musculoskeletal: Old healed fracture of the lateral aspect of the left ninth rib. There are no acute displaced fractures or aggressive appearing lytic or blastic lesions noted in the visualized portions of the skeleton. CT ABDOMEN PELVIS FINDINGS Hepatobiliary: No suspicious cystic or solid hepatic lesions. No intra or extrahepatic biliary ductal dilatation. Gallbladder is normal in appearance. Pancreas: No pancreatic mass. No pancreatic ductal dilatation. No pancreatic or peripancreatic fluid collections or inflammatory changes. Spleen: Unremarkable. Adrenals/Urinary Tract: Bilateral kidneys and bilateral adrenal glands are normal in appearance. No hydroureteronephrosis. Urinary bladder is nearly completely obscured by beam hardening artifact from the patient's bilateral hip arthroplasties. Stomach/Bowel: Stomach is completely decompressed, but otherwise unremarkable. No pathologic dilatation of small bowel or colon. Numerous colonic diverticulae are noted, without surrounding inflammatory changes to suggest an acute diverticulitis at this time. The appendix is not confidently identified and may be surgically absent. Regardless, there are no inflammatory changes noted adjacent to the cecum to suggest the presence of an acute appendicitis at this time. Vascular/Lymphatic: Aortic atherosclerosis, without evidence of aneurysm or dissection in the abdominal or pelvic vasculature. No lymphadenopathy noted in the abdomen or pelvis. Reproductive: Status post prostatectomy. At the base of the penis there is a large amount of serpiginous high attenuation material, similar to prior study from 02/21/2019,  presumably related to prior prostatectomy. Other: No significant volume of ascites.  No pneumoperitoneum. Musculoskeletal: Status post bilateral hip arthroplasty. There are no acute displaced fractures aggressive appearing lytic or blastic lesions noted in the visualized portions of the skeleton. IMPRESSION: 1. No definitive evidence of significant acute traumatic injury to the chest, abdomen or pelvis. 2. The appearance of the lungs is highly concerning for multilobar bilateral pneumonia, as above. There are small bilateral partially loculated pleural effusions. 3. Cardiomegaly with right ventricular and right atrial dilatation. 4. Aortic atherosclerosis, in addition to 2 vessel coronary artery disease. 5. There are calcifications of the aortic valve. Echocardiographic correlation for evaluation of potential valvular dysfunction may be warranted if clinically indicated. 6. Colonic diverticulosis without evidence of acute diverticulitis at this time. 7. Additional incidental findings, as above. Electronically Signed   By: Vinnie Langton M.D.   On: 12/21/2019 11:22   DG Chest Port 1 View  Result Date: 12/21/2019 CLINICAL DATA:  Fall. EXAM: PORTABLE CHEST 1 VIEW COMPARISON:  04/30/2019. FINDINGS: Mediastinum and hilar structures normal. Prominent cardiomegaly again noted. Prominent bibasilar pulmonary infiltrates/edema noted on today's exam. Small bilateral pleural effusions noted. Biapical pleural thickening consistent scarring. What appears to represent a skin fold on the left is noted on 1-2 views obtained. Lung markings are noted outside of this apparent skin fold. This finding is not noted on the second image obtained. To completely ensure the absence of a pneumothorax repeat PA chest x-ray suggested. IMPRESSION: 1. What appears to represent a skin fold on the left is noted on 1 of the two views obtained. To completely ensure the absence of a small pneumothorax repeat PA chest x-ray suggested. 2. Prominent  bibasilar pulmonary infiltrates/edema noted on today's exam. 3.  Severe cardiomegaly again noted. Critical Value/emergent results were called by telephone at the time of interpretation on  12/21/2019 at 10:12 am to provider ERIC KATZ , who verbally acknowledged these results. Electronically Signed   By: Marcello Moores  Register   On: 12/21/2019 10:10   DG Knee Complete 4 Views Left  Result Date: 12/21/2019 CLINICAL DATA:  Fall. EXAM: LEFT KNEE - COMPLETE 4+ VIEW COMPARISON:  03/25/2019. FINDINGS: Bandage noted anteriorly. No evidence of effusion. Diffuse osteopenia. Mild patellofemoral degenerative change. No evidence of fracture dislocation. Peripheral vascular calcification. IMPRESSION: 1. Diffuse osteopenia. Mild degenerative change. No acute bony or joint abnormality. 2.  Peripheral vascular disease. Electronically Signed   By: Marcello Moores  Register   On: 12/21/2019 10:49   ECHOCARDIOGRAM COMPLETE  Result Date: 12/21/2019    ECHOCARDIOGRAM REPORT   Patient Name:   Jeremy Kelley Date of Exam: 12/21/2019 Medical Rec #:  [id number]        Height:       71.0 in Accession #:    [id number]       Weight:       130.0 lb Date of Birth:  March 16, 1927       BSA:          1.756 m Patient Age:    86 years         BP:           121/55 mmHg Patient Gender: M                HR:           55 bpm. Exam Location:  Inpatient Procedure: 2D Echo Indications:    syncope 780.2  History:        Patient has no prior history of Echocardiogram examinations. No                 medical hx on file.  Sonographer:    Jannett Celestine RDCS (AE) Referring Phys: [id number] Lequita Halt  Sonographer Comments: Suboptimal apical window. Restricted mobility. IMPRESSIONS  1. Compared to previous echo from 10/01/19 ( under MRN [id number]) the RV size and function are unchanged . He has cor pulmonale.  2. The aortic insufficiency is better visualized in previous echo and is likely moderate - severe.  3. Left ventricular ejection fraction, by estimation, is 55 to 60%.  The left ventricle has normal function. The left ventricle has no regional wall motion abnormalities. Left ventricular diastolic function could not be evaluated. There is the interventricular septum is flattened in diastole ('D' shaped left ventricle), consistent with right ventricular volume overload.  4. Right ventricular systolic function is mildly reduced. The right ventricular size is severely enlarged. There is severely elevated pulmonary artery systolic pressure. The estimated right ventricular systolic pressure is 96.7 mmHg.  5. Left atrial size was mildly dilated.  6. Right atrial size was severely dilated.  7. The mitral valve is abnormal. Mild mitral valve regurgitation.  8. The tricuspid valve is degenerative. Tricuspid valve regurgitation is moderate to severe.  9. The aortic valve is calcified. Aortic valve regurgitation is mild to moderate. No aortic stenosis is present. FINDINGS  Left Ventricle: Left ventricular ejection fraction, by estimation, is 55 to 60%. The left ventricle has normal function. The left ventricle has no regional wall motion abnormalities. The left ventricular internal cavity size was small. There is no left ventricular hypertrophy. The interventricular septum is flattened in diastole ('D' shaped left ventricle), consistent with right ventricular volume overload. Left ventricular diastolic function could not be evaluated due to atrial fibrillation. Left ventricular diastolic function could not be evaluated.  Right Ventricle: The right ventricular size is severely enlarged. Right vetricular wall thickness was not assessed. Right ventricular systolic function is mildly reduced. There is severely elevated pulmonary artery systolic pressure. The tricuspid regurgitant velocity is 3.87 m/s, and with an assumed right atrial pressure of 15 mmHg, the estimated right ventricular systolic pressure is 23.7 mmHg. Left Atrium: Left atrial size was mildly dilated. Right Atrium: Right atrial size  was severely dilated. Pericardium: There is no evidence of pericardial effusion. Mitral Valve: The mitral valve is abnormal. There is mild thickening of the mitral valve leaflet(s). Mild mitral valve regurgitation. Tricuspid Valve: The tricuspid valve is degenerative in appearance. Tricuspid valve regurgitation is moderate to severe. Aortic Valve: The aortic valve is calcified. Aortic valve regurgitation is mild to moderate. No aortic stenosis is present. Pulmonic Valve: The pulmonic valve was not well visualized. Pulmonic valve regurgitation is not visualized. Aorta: The aortic root and ascending aorta are structurally normal, with no evidence of dilitation. IAS/Shunts: The atrial septum is grossly normal.  LEFT VENTRICLE PLAX 2D LVIDd:         4.50 cm LVIDs:         2.60 cm LV PW:         1.00 cm LV IVS:        0.80 cm LVOT diam:     2.30 cm LV SV:         88 LV SV Index:   50 LVOT Area:     4.15 cm  RIGHT VENTRICLE RV S prime:     8.92 cm/s TAPSE (M-mode): 1.6 cm LEFT ATRIUM         Index      RIGHT ATRIUM           Index LA diam:    3.70 cm 2.11 cm/m RA Area:     34.50 cm                                RA Volume:   139.00 ml 79.17 ml/m  AORTIC VALVE LVOT Vmax:   91.40 cm/s LVOT Vmean:  65.250 cm/s LVOT VTI:    0.213 m  AORTA Ao Root diam: 3.20 cm MITRAL VALVE                TRICUSPID VALVE MV Area (PHT): 5.13 cm     TR Peak grad:   59.9 mmHg MV Decel Time: 148 msec     TR Vmax:        387.00 cm/s MV E velocity: 122.00 cm/s                             SHUNTS                             Systemic VTI:  0.21 m                             Systemic Diam: 2.30 cm Mertie Moores MD Electronically signed by Mertie Moores MD Signature Date/Time: 12/21/2019/2:36:09 PM    Final         Subjective: Patient is feeling better, no nausea or vomiting, no chest pain, continue to have dyspnea on exertion. Very weak and deconditioned, positive urine and stool incontinence, no diarrhea. He wants to return to assisted  living facility.   Discharge Exam: Vitals:   12/24/19 2223 12/25/19 0510  BP: 129/65 (!) 100/58  Pulse: 74 76  Resp: 18 20  Temp: 98.7 F (37.1 C) (!) 97.2 F (36.2 C)  SpO2: 93% 91%   Vitals:   12/24/19 0744 12/24/19 1116 12/24/19 2223 12/25/19 0510  BP: 140/75 134/60 129/65 (!) 100/58  Pulse: 67 75 74 76  Resp: 16 18 18 20   Temp: 98.3 F (36.8 C) (!) 97.3 F (36.3 C) 98.7 F (37.1 C) (!) 97.2 F (36.2 C)  TempSrc:  Oral Oral Oral  SpO2: 92% 93% 93% 91%  Weight:    62.6 kg  Height:        General: Not in pain or dyspnea, deconditioned and ill looking appearing  Neurology: Awake and alert, non focal  E ENT: no pallor, no icterus, oral mucosa moist Cardiovascular: No JVD. S1-S2 present, rhythmic, no gallops, rubs, or murmurs. No lower extremity edema. Pulmonary: positive breath sounds bilaterally, no wheezing, scattered rhonchi and rales on anterior auscultation. Gastrointestinal. Abdomen soft and non tender.  Skin. No rashes Musculoskeletal: no joint deformities   The results of significant diagnostics from this hospitalization (including imaging, microbiology, ancillary and laboratory) are listed below for reference.     Microbiology: Recent Results (from the past 240 hour(s))  SARS Coronavirus 2 by RT PCR (hospital order, performed in [facility] hospital lab) Nasopharyngeal Nasopharyngeal Swab     Status: None   Collection Time: 12/21/19  9:42 AM   Specimen: Nasopharyngeal Swab  Result Value Ref Range Status   SARS Coronavirus 2 NEGATIVE NEGATIVE Final    Comment: (NOTE) SARS-CoV-2 target nucleic acids are NOT DETECTED.  The SARS-CoV-2 RNA is generally detectable in upper and lower respiratory specimens during the acute phase of infection. The lowest concentration of SARS-CoV-2 viral copies this assay can detect is 250 copies / mL. A negative result does not preclude SARS-CoV-2 infection and should not be used as the sole basis for treatment or  other patient management decisions.  A negative result may occur with improper specimen collection / handling, submission of specimen other than nasopharyngeal swab, presence of viral mutation(s) within the areas targeted by this assay, and inadequate number of viral copies (<250 copies / mL). A negative result must be combined with clinical observations, patient history, and epidemiological information.  Fact Sheet for Patients:   StrictlyIdeas.no  Fact Sheet for Healthcare Providers: BankingDealers.co.za  This test is not yet approved or  cleared by the [place] FDA and has been authorized for detection and/or diagnosis of SARS-CoV-2 by FDA under an Emergency Use Authorization (EUA).  This EUA will remain in effect (meaning this test can be used) for the duration of the COVID-19 declaration under Section 564(b)(1) of the Act, 21 U.S.C. section 360bbb-3(b)(1), unless the authorization is terminated or revoked sooner.  Performed at [facility], [place] [address] [place], [place] [address]   Blood culture (routine x 2)     Status: None (Preliminary result)   Collection Time: 12/21/19 12:00 PM   Specimen: BLOOD RIGHT ARM  Result Value Ref Range Status   Specimen Description BLOOD RIGHT ARM  Final   Special Requests   Final    BOTTLES DRAWN AEROBIC AND ANAEROBIC Blood Culture results may not be optimal due to an inadequate volume of blood received in culture bottles   Culture   Final    NO GROWTH 4 DAYS Performed at [facility], [place]  [address], [place], [place] [address]    Report Status PENDING  Incomplete  Blood culture (routine x 2)     Status: None (Preliminary result)   Collection Time: 12/21/19 12:00 PM   Specimen: BLOOD LEFT ARM  Result Value Ref Range Status   Specimen Description BLOOD LEFT ARM  Final   Special Requests   Final    BOTTLES DRAWN AEROBIC AND ANAEROBIC Blood Culture results may not be optimal  due to an inadequate volume of blood received in culture bottles   Culture   Final    NO GROWTH 4 DAYS Performed at [facility], [place] [address], [place],  [address]    Report Status PENDING  Incomplete     Labs: BNP (last 3 results) No results for input(s): BNP in the last 8760 hours. Basic Metabolic Panel: Recent Labs  Lab 12/21/19 0939 12/21/19 0939 12/21/19 0945 12/22/19 0352 12/23/19 0124 12/24/19 0808 12/25/19 0732  NA 143   < > 144 143 143 142 140  K 3.7   < > 3.7 4.2 5.0 4.5 4.5  CL 105   < > 105 108 112* 112* 110  CO2 25  --   --  26 24 22 23   GLUCOSE 111*   < > 108* 88 114* 93 110*  BUN 26*   < > 28* 21 19 16 17   CREATININE 1.59*   < > 1.50* 1.39* 1.31* 1.24 1.18  CALCIUM 9.2  --   --  9.1 9.1 9.4 9.4  MG  --   --   --   --   --   --  2.0   < > = values in this interval not displayed.   Liver Function Tests: Recent Labs  Lab 12/21/19 0939  AST 29  ALT 21  ALKPHOS 105  BILITOT 1.3*  PROT 6.0*  ALBUMIN 3.4*   No results for input(s): LIPASE, AMYLASE in the last 168 hours. No results for input(s): AMMONIA in the last 168 hours. CBC: Recent Labs  Lab 12/21/19 0939 12/21/19 0945 12/24/19 0808  WBC 13.8*  --  8.6  HGB 11.5* 12.6* 11.6*  HCT 39.2 37.0* 39.1  MCV 95.8  --  96.3  PLT 210  --  180   Cardiac Enzymes: No results for input(s): CKTOTAL, CKMB, CKMBINDEX, TROPONINI in the last 168 hours. BNP: Invalid input(s): POCBNP CBG: Recent Labs  Lab 12/23/19 1639 12/23/19 2139 12/24/19 0623 12/24/19 1143 12/25/19 0617  GLUCAP 104* 101* 91 114* 97   D-Dimer No results for input(s): DDIMER in the last 72 hours. Hgb A1c No results for input(s): HGBA1C in the last 72 hours. Lipid Profile No results for input(s): CHOL, HDL, LDLCALC, TRIG, CHOLHDL, LDLDIRECT in the last 72 hours. Thyroid function studies No results for input(s): TSH, T4TOTAL, T3FREE, THYROIDAB in the last 72 hours.  Invalid input(s): FREET3 Anemia work up No  results for input(s): VITAMINB12, FOLATE, FERRITIN, TIBC, IRON, RETICCTPCT in the last 72 hours. Urinalysis    Component Value Date/Time   COLORURINE YELLOW 12/22/2019 0020   APPEARANCEUR CLEAR 12/22/2019 0020   LABSPEC 1.038 (H) 12/22/2019 0020   PHURINE 6.0 12/22/2019 0020   GLUCOSEU NEGATIVE 12/22/2019 0020   HGBUR NEGATIVE 12/22/2019 0020   BILIRUBINUR NEGATIVE 12/22/2019 0020   KETONESUR NEGATIVE 12/22/2019 0020   PROTEINUR 30 (A) 12/22/2019 0020   NITRITE NEGATIVE 12/22/2019 0020   LEUKOCYTESUR LARGE (A) 12/22/2019 0020   Sepsis Labs Invalid input(s): PROCALCITONIN,  WBC,  LACTICIDVEN Microbiology Recent Results (  from the past 240 hour(s))  SARS Coronavirus 2 by RT PCR (hospital order, performed in [facility] hospital lab) Nasopharyngeal Nasopharyngeal Swab     Status: None   Collection Time: 12/21/19  9:42 AM   Specimen: Nasopharyngeal Swab  Result Value Ref Range Status   SARS Coronavirus 2 NEGATIVE NEGATIVE Final    Comment: (NOTE) SARS-CoV-2 target nucleic acids are NOT DETECTED.  The SARS-CoV-2 RNA is generally detectable in upper and lower respiratory specimens during the acute phase of infection. The lowest concentration of SARS-CoV-2 viral copies this assay can detect is 250 copies / mL. A negative result does not preclude SARS-CoV-2 infection and should not be used as the sole basis for treatment or other patient management decisions.  A negative result may occur with improper specimen collection / handling, submission of specimen other than nasopharyngeal swab, presence of viral mutation(s) within the areas targeted by this assay, and inadequate number of viral copies (<250 copies / mL). A negative result must be combined with clinical observations, patient history, and epidemiological information.  Fact Sheet for Patients:   StrictlyIdeas.no  Fact Sheet for Healthcare Providers: BankingDealers.co.za  This  test is not yet approved or  cleared by the [place] FDA and has been authorized for detection and/or diagnosis of SARS-CoV-2 by FDA under an Emergency Use Authorization (EUA).  This EUA will remain in effect (meaning this test can be used) for the duration of the COVID-19 declaration under Section 564(b)(1) of the Act, 21 U.S.C. section 360bbb-3(b)(1), unless the authorization is terminated or revoked sooner.  Performed at [facility], [place] [address] [place], [place] [address]   Blood culture (routine x 2)     Status: None (Preliminary result)   Collection Time: 12/21/19 12:00 PM   Specimen: BLOOD RIGHT ARM  Result Value Ref Range Status   Specimen Description BLOOD RIGHT ARM  Final   Special Requests   Final    BOTTLES DRAWN AEROBIC AND ANAEROBIC Blood Culture results may not be optimal due to an inadequate volume of blood received in culture bottles   Culture   Final    NO GROWTH 4 DAYS Performed at [facility], [place] [address] [place], [place] [address]    Report Status PENDING  Incomplete  Blood culture (routine x 2)     Status: None (Preliminary result)   Collection Time: 12/21/19 12:00 PM   Specimen: BLOOD LEFT ARM  Result Value Ref Range Status   Specimen Description BLOOD LEFT ARM  Final   Special Requests   Final    BOTTLES DRAWN AEROBIC AND ANAEROBIC Blood Culture results may not be optimal due to an inadequate volume of blood received in culture bottles   Culture   Final    NO GROWTH 4 DAYS Performed at [facility], [place] [address], [place], [place] [address]    Report Status PENDING  Incomplete     Time coordinating discharge: 45 minutes  SIGNED:   Tawni Millers, MD  [facility] 12/25/2019, 10:31 AM

## 2019-12-25 NOTE — Plan of Care (Signed)
  Problem: Safety: Goal: Ability to remain free from injury will improve Outcome: Progressing   

## 2019-12-25 NOTE — Care Management Important Message (Signed)
Important Message  Patient Details  Name: Jeremy Kelley MRN: [id number] Date of Birth: 08-03-1926   Medicare Important Message Given:  Yes     Shelda Altes 12/25/2019, 9:53 AM

## 2019-12-26 LAB — BASIC METABOLIC PANEL
Anion gap: 8 (ref 5–15)
BUN: 18 mg/dL (ref 8–23)
CO2: 23 mmol/L (ref 22–32)
Calcium: 9.9 mg/dL (ref 8.9–10.3)
Chloride: 111 mmol/L (ref 98–111)
Creatinine, Ser: 1.22 mg/dL (ref 0.61–1.24)
GFR calc Af Amer: >60 mL/min (ref >60)
GFR calc non Af Amer: 52 mL/min — ABNORMAL LOW (ref >60)
Glucose, Bld: 129 mg/dL — ABNORMAL HIGH (ref 70–99)
Potassium: 4.8 mmol/L (ref 3.5–5.1)
Sodium: 142 mmol/L (ref 135–145)

## 2019-12-26 LAB — CULTURE, BLOOD (ROUTINE X 2)
Culture: NO GROWTH
Culture: NO GROWTH

## 2019-12-26 LAB — GLUCOSE, CAPILLARY
Glucose-Capillary: 106 mg/dL — ABNORMAL HIGH (ref 70–99)
Glucose-Capillary: 106 mg/dL — ABNORMAL HIGH (ref 70–99)

## 2019-12-26 LAB — SARS CORONAVIRUS 2 BY RT PCR (HOSPITAL ORDER, PERFORMED IN ~~LOC~~ HOSPITAL LAB): SARS Coronavirus 2: NEGATIVE

## 2019-12-26 NOTE — NC FL2 (Signed)
[place] LEVEL OF CARE SCREENING TOOL     IDENTIFICATION  Patient Name: Jeremy Kelley Birthdate: 10/19/1926 Sex: male Admission Date (Current Location): 12/21/2019  [facility] and [place] Number:  Herbalist and Address:  [facility] [place], [place] [address], [place], [place] [address]      Provider Number: [id number]  Attending Physician Name and Address:  Oswald Hillock, MD  Relative Name and Phone Number:  Orbie Hurst [phone]    Current Level of Care: Hospital Recommended Level of Care: [place] Prior Approval Number:    Date Approved/Denied:   PASRR Number:    Discharge Plan: Other (Comment) (ALF)    Current Diagnoses: Patient Active Problem List   Diagnosis Date Noted  . Pressure injury of skin 12/24/2019  . Dysphagia   . Syncope 12/22/2019  . Severe protein-calorie malnutrition ([place]) 12/22/2019  . A-fib ([place]) 12/22/2019  . Alzheimer's dementia ([place]) 12/22/2019  . Advanced directive placed in chart this admission   . DNR (do not resuscitate)   . DNI (do not intubate)   . Failure to thrive in adult 12/21/2019  . Acute hypoxemic respiratory failure ([place])   . Aspiration pneumonia of both lungs ([place])   . Palliative care by specialist   . Goals of care, counseling/discussion   . Generalized weakness   . Encounter for hospice care discussion     Orientation RESPIRATION BLADDER Height & Weight     Self, Time, Situation, Place  O2 ([place] 5 liters) Incontinent, External catheter Weight: 135 lb (61.2 kg) Height:  5\' 11"  (180.3 cm)  BEHAVIORAL SYMPTOMS/MOOD NEUROLOGICAL BOWEL NUTRITION STATUS      Continent Diet (Full liquid diet, ensure enlive)  AMBULATORY STATUS COMMUNICATION OF NEEDS Skin   Extensive Assist Verbally Skin abrasions (pressure injury 12/22/19 coccyx stage 1, abrasion arm, face, knee (left), arm, knee, leg (right))                       Personal Care Assistance Level of Assistance   Total care, Bathing, Feeding, Dressing Bathing Assistance: Maximum assistance Feeding assistance: Limited assistance Dressing Assistance: Maximum assistance Total Care Assistance: Maximum assistance   Functional Limitations Info  Sight, Hearing, Speech Sight Info: Impaired Hearing Info: Impaired Speech Info: Adequate    SPECIAL CARE FACTORS FREQUENCY                       Contractures Contractures Info: Not present    Additional Factors Info  Code Status, Allergies Code Status Info: DNR Allergies Info: NKA           Current Medications (12/26/2019):  This is the current hospital active medication list    Discharge Medications: STOP taking these medications   Eliquis 2.5 MG Tabs tablet Generic drug: apixaban   metoprolol succinate 25 MG 24 hr tablet Commonly known as: TOPROL-XL   potassium chloride 10 MEQ tablet Commonly known as: KLOR-CON   torsemide 20 MG tablet Commonly known as: DEMADEX   VIRT-PHOS 250 NEUTRAL PO     TAKE these medications   acetaminophen 325 MG tablet Commonly known as: TYLENOL Take 650 mg by mouth every 6 (six) hours as needed for mild pain.   amoxicillin-clavulanate 875-125 MG tablet Commonly known as: AUGMENTIN Take 1 tablet by mouth every 12 (twelve) hours for 5 days.   cholecalciferol 25 MCG (1000 UNIT) tablet Commonly known as: VITAMIN D3 Take 1,000 Units by mouth daily.  colestipol 1 g tablet Commonly known as: COLESTID Take 1 g by mouth 2 (two) times daily.   donepezil 10 MG tablet Commonly known as: ARICEPT Take 10 mg by mouth at bedtime.   feeding supplement (ENSURE ENLIVE) Liqd Take 237 mLs by mouth 3 (three) times daily between meals.   Fiber 28.3 % Powd Take 30 mLs by mouth in the morning and at bedtime. Mix with suitable liquid   FLORAJEN DIGESTION PO Take 1 capsule by mouth daily.   ipratropium 0.06 % nasal spray Commonly known as: ATROVENT Place 2 sprays into both nostrils 2  (two) times daily as needed for congestion.   liver oil-zinc oxide 40 % ointment Commonly known as: DESITIN Apply 1 application topically at bedtime. Apply to coccyx/sacrum area   loperamide 2 MG capsule Commonly known as: IMODIUM Take 2 mg by mouth 4 (four) times daily as needed for diarrhea or loose stools.   loperamide 2 MG capsule Commonly known as: IMODIUM Take 2 mg by mouth 3 (three) times daily.   multivitamin Liqd Take 15 mLs by mouth daily.   Myrbetriq 50 MG Tb24 tablet Generic drug: mirabegron ER Take 50 mg by mouth daily.   NONFORMULARY OR COMPOUNDED ITEM Apply 1 application topically at bedtime. Apply to affected area(s)  TAC 0.1% lotion/eucerin 1:1   Olopatadine HCl 0.2 % Soln Place 1 drop into both eyes daily. Itching/allergy   Systane 0.4-0.3 % Gel ophthalmic gel Generic drug: Polyethyl Glycol-Propyl Glycol Place 1 application into both eyes at bedtime.   Vitamin B Complex Tabs Take 1 tablet by mouth daily.     Relevant Imaging Results:  Relevant Lab Results:   Additional Information SSN [id number] Add [facility] [place], [place]

## 2019-12-26 NOTE — Progress Notes (Signed)
Manufacturing engineer [facility]) Hospital Liaison note.  Oxygen was delivered to [facility] on 12/24/19.  Patient's admission visit has been reschedule for 12/27/19 at 1430.    Please call with any questions.      Farrel Gordon, RN, [facility] (listed on AMION under [place])   [phone]

## 2019-12-26 NOTE — Progress Notes (Signed)
Nutrition Follow-up  DOCUMENTATION CODES:   Not applicable  INTERVENTION:   -Continue Ensure Enlive po TID, each supplement provides 350 kcal and 20 grams of protein -Continue Magic cup TID with meals, each supplement provides 290 kcal and 9 grams of protein -Continue Hormel Shake TID with meals, each supplement provides 520 kcals and 22 grams protein -Continue 15 ml liquid MVI daily -Continue full liquid diet  NUTRITION DIAGNOSIS:   Inadequate oral intake related to dysphagia as evidenced by per patient/family report.  Ongoing  GOAL:   Patient will meet greater than or equal to 90% of their needs  Progressing   MONITOR:   PO intake, Supplement acceptance, Diet advancement, Labs, Weight trends, Skin, I & O's  REASON FOR ASSESSMENT:   Consult Assessment of nutrition requirement/status  ASSESSMENT:   Jeremy Kelley is a 86 y.o. male with medical history significant of Alzheimer dementia, chronic dysphagia, failure to thrive, A. fib on Eliquis, presented with syncope and fall.  9/10- s/p BSE- advanced to full liquid diet  Reviewed I/O's: +480 ml x 24 hours and +1 L since admission  Attempted to speak with pt via call to hospital room phone, however, unable to reach.   Per GI notes, concern for achalasia and oropharyngeal dysphagia. Pt has declined EGD and botox injections. Palliative care is following; pt desires to return to ALF with hospice care once medically stable.   Intake has improved since last visit. Noted meal completion 50%. Pt is accepting Ensure Enlive supplements about 50% of the time.   Medications reviewed and include metamucil.  Labs reviewed: CBGS: 106-113.  Diet Order:   Diet Order            Diet full liquid Room service appropriate? Yes with Assist; Fluid consistency: Thin  Diet effective now                 EDUCATION NEEDS:   No education needs have been identified at this time  Skin:  Skin Assessment: Skin Integrity  Issues: Skin Integrity Issues:: Stage I, Other (Comment) Stage I: coccyx Other: skin tear to face  Last BM:  12/24/19  Height:   Ht Readings from Last 1 Encounters:  12/21/19 5\' 11"  (1.803 m)    Weight:   Wt Readings from Last 1 Encounters:  12/26/19 61.2 kg    Ideal Body Weight:  78.2 kg  BMI:  Body mass index is 18.83 kg/m.  Estimated Nutritional Needs:   Kcal:  1850-2050  Protein:  90-105 grams  Fluid:  > 1.8 L    Jeremy Kelley, RD, LDN, [place] Registered Dietitian II Certified Diabetes Care and Education Specialist Please refer to [facility] for RD and/or RD on-call/weekend/after hours pager

## 2019-12-26 NOTE — Progress Notes (Addendum)
[facility]  PROGRESS NOTE  Jeremy Kelley WTU:[id number] DOB: June 17, 1926 DOA: 12/21/2019 PCP: [name], NP   Brief HPI:   86 year old male with history of atrial fibrillation on Eliquis, Alzheimer's dementia, chronic dysphagia who presented from assisted living facility with syncope.  In the ED pulse ox was in the 80s, CT chest revealed multifocal pneumonia and loculated effusion along with a dilated esophagus.GI was consulted for possible esophageal stricture, he declined further invasive interventions.    Subjective   Patient seen and examined, awaiting bed at skilled nursing facility.  Denies any complaints at this time.   Assessment/Plan:     1. Acute hypoxemic respiratory failure-chronic cor pulmonale-patient was started on Unasyn and transitioned to oral Augmentin to complete 8 days of therapy.  Patient likely has chronic aspiration, declined endoscopic procedures. 2. Chronic atrial fibrillation-patient has persistent atrial fibrillation, heart rate is controlled.  Anticoagulation is on hold due to bleeding risk.  Off AV nodal blocker due to bradycardia and recurrent vasovagal syncope. 3. Vasovagal syncope-patient had syncopal episode on 12/24/2019 while on the commode.  Heart rate went down to 30s with loss of consciousness.  Patient recovered rapidly with no chest pain or dyspnea.  AV nodal blockers were discontinued. 4. CKD stage III-stable renal function serum creatinine 1.18.  IV fluids were discontinued. 5. Dementia-no behavior disturbance.  Continue donepezil 6. Okay to have condom cath at ALF.    COVID-19 Labs  No results for input(s): DDIMER, FERRITIN, LDH, CRP in the last 72 hours.  Lab Results  Component Value Date   [place] NEGATIVE 12/21/2019     Scheduled medications:   . acidophilus  1 capsule Oral Daily  . amoxicillin-clavulanate  1 tablet Oral Q12H  . B-complex with vitamin C  1 tablet Oral Daily  . cholecalciferol  1,000 Units Oral  Daily  . colestipol  1 g Oral BID  . donepezil  10 mg Oral QHS  . feeding supplement (ENSURE ENLIVE)  237 mL Oral TID BM  . ipratropium  2 spray Each Nare TID  . loperamide  2 mg Oral TID  . mirabegron ER  50 mg Oral Daily  . multivitamin  15 mL Oral Daily  . olopatadine  1 drop Both Eyes BID  . polyvinyl alcohol  1 drop Both Eyes QHS  . psyllium  1 packet Oral Daily  . sodium chloride flush  3 mL Intravenous Q12H         CBG: Recent Labs  Lab 12/25/19 0617 12/25/19 1232 12/25/19 2123 12/26/19 0614 12/26/19 1133  GLUCAP 97 106* 113* 106* 106*    SpO2: 91 % O2 Flow Rate (L/min): 5 L/min    CBC: Recent Labs  Lab 12/21/19 0939 12/21/19 0945 12/24/19 0808  WBC 13.8*  --  8.6  HGB 11.5* 12.6* 11.6*  HCT 39.2 37.0* 39.1  MCV 95.8  --  96.3  PLT 210  --  179    Basic Metabolic Panel: Recent Labs  Lab 12/22/19 0352 12/23/19 0124 12/24/19 0808 12/25/19 0732 12/26/19 0844  NA 143 143 142 140 142  K 4.2 5.0 4.5 4.5 4.8  CL 108 112* 112* 110 111  CO2 26 24 22 23 23   GLUCOSE 88 114* 93 110* 129*  BUN 21 19 16 17 18   CREATININE 1.39* 1.31* 1.24 1.18 1.22  CALCIUM 9.1 9.1 9.4 9.4 9.9  MG  --   --   --  2.0  --      Liver Function Tests: Recent  Labs  Lab 12/21/19 0939  AST 29  ALT 21  ALKPHOS 105  BILITOT 1.3*  PROT 6.0*  ALBUMIN 3.4*     Antibiotics: Anti-infectives (From admission, onward)   Start     Dose/Rate Route Frequency Ordered Stop   12/25/19 1130  amoxicillin-clavulanate (AUGMENTIN) 875-125 MG per tablet 1 tablet        1 tablet Oral Every 12 hours 12/25/19 1035     12/25/19 0000  amoxicillin-clavulanate (AUGMENTIN) 875-125 MG tablet        1 tablet Oral Every 12 hours 12/25/19 1038 12/30/19 2359   12/23/19 1600  Ampicillin-Sulbactam (UNASYN) 3 g in sodium chloride 0.9 % 100 mL IVPB  Status:  Discontinued        3 g 200 mL/hr over 30 Minutes Intravenous Every 8 hours 12/23/19 1516 12/25/19 1035   12/21/19 1400  ampicillin-sulbactam  (UNASYN) 1.5 g in sodium chloride 0.9 % 100 mL IVPB  Status:  Discontinued        1.5 g 200 mL/hr over 30 Minutes Intravenous Every 12 hours 12/21/19 1324 12/21/19 1330   12/21/19 1400  Ampicillin-Sulbactam (UNASYN) 3 g in sodium chloride 0.9 % 100 mL IVPB  Status:  Discontinued        3 g 200 mL/hr over 30 Minutes Intravenous Every 12 hours 12/21/19 1330 12/23/19 1516   12/21/19 1145  vancomycin (VANCOCIN) IVPB 1000 mg/200 mL premix        1,000 mg 200 mL/hr over 60 Minutes Intravenous  Once 12/21/19 1131 12/21/19 1406   12/21/19 1145  piperacillin-tazobactam (ZOSYN) IVPB 3.375 g        3.375 g 100 mL/hr over 30 Minutes Intravenous  Once 12/21/19 1131 12/21/19 1241       DVT prophylaxis: Lovenox  Code Status: DNR  Family Communication: No family at bedside    Status is: Inpatient  Dispo: The patient is from: ALF              Anticipated d/c is to: Skilled nursing facility              Anticipated d/c date is: 12/27/2019              Patient currently medically stable for discharge  Barrier to discharge-awaiting bed at skilled nursing facility  Pressure Injury 12/22/19 Coccyx Stage 1 -  Intact skin with non-blanchable redness of a localized area usually over a bony prominence. (Active)  12/22/19 2034  Location: Coccyx  Location Orientation:   Staging: Stage 1 -  Intact skin with non-blanchable redness of a localized area usually over a bony prominence.  Wound Description (Comments):   Present on Admission:          Consultants:  Gastroenterology  Palliative care  Procedures:     Objective   Vitals:   12/26/19 0100 12/26/19 0428 12/26/19 0956 12/26/19 1304  BP:   124/62 124/72  Pulse:  66 67 65  Resp:  18 18 18   Temp:  97.6 F (36.4 C) 97.6 F (36.4 C) (!) 97.3 F (36.3 C)  TempSrc:   Oral Oral  SpO2:  95% 95% 91%  Weight: 61.2 kg     Height:        Intake/Output Summary (Last 24 hours) at 12/26/2019 1356 Last data filed at 12/25/2019  2023 Gross per 24 hour  Intake 240 ml  Output --  Net 240 ml    09/13 1901 - 09/15 0700 In: 600 [P.O.:600] Out: 850 [Urine:850]  Filed  Weights   12/24/19 0500 12/25/19 0510 12/26/19 0100  Weight: 61.7 kg 62.6 kg 61.2 kg    Physical Examination:    General: Appears in no acute distress  Cardiovascular: S1-S2, regular, no murmur auscultated  Respiratory: Clear to auscultation bilaterally, no wheezing or crackles auscultated  Abdomen: Abdomen is soft, nontender, no organomegaly  Extremities: No edema in the lower extremities  Neurologic: Alert, oriented x3, intact insight and judgment    Data Reviewed:   Recent Results (from the past 240 hour(s))  SARS Coronavirus 2 by RT PCR (hospital order, performed in [facility] hospital lab) Nasopharyngeal Nasopharyngeal Swab     Status: None   Collection Time: 12/21/19  9:42 AM   Specimen: Nasopharyngeal Swab  Result Value Ref Range Status   SARS Coronavirus 2 NEGATIVE NEGATIVE Final    Comment: (NOTE) SARS-CoV-2 target nucleic acids are NOT DETECTED.  The SARS-CoV-2 RNA is generally detectable in upper and lower respiratory specimens during the acute phase of infection. The lowest concentration of SARS-CoV-2 viral copies this assay can detect is 250 copies / mL. A negative result does not preclude SARS-CoV-2 infection and should not be used as the sole basis for treatment or other patient management decisions.  A negative result may occur with improper specimen collection / handling, submission of specimen other than nasopharyngeal swab, presence of viral mutation(s) within the areas targeted by this assay, and inadequate number of viral copies (<250 copies / mL). A negative result must be combined with clinical observations, patient history, and epidemiological information.  Fact Sheet for Patients:   StrictlyIdeas.no  Fact Sheet for Healthcare  Providers: BankingDealers.co.za  This test is not yet approved or  cleared by the [place] FDA and has been authorized for detection and/or diagnosis of SARS-CoV-2 by FDA under an Emergency Use Authorization (EUA).  This EUA will remain in effect (meaning this test can be used) for the duration of the COVID-19 declaration under Section 564(b)(1) of the Act, 21 U.S.C. section 360bbb-3(b)(1), unless the authorization is terminated or revoked sooner.  Performed at [facility], [place] [address] [place], [place] [address]   Blood culture (routine x 2)     Status: None   Collection Time: 12/21/19 12:00 PM   Specimen: BLOOD RIGHT ARM  Result Value Ref Range Status   Specimen Description BLOOD RIGHT ARM  Final   Special Requests   Final    BOTTLES DRAWN AEROBIC AND ANAEROBIC Blood Culture results may not be optimal due to an inadequate volume of blood received in culture bottles   Culture   Final    NO GROWTH 5 DAYS Performed at [facility], [place] [address], [place], [place] [address]    Report Status 12/26/2019 FINAL  Final  Blood culture (routine x 2)     Status: None   Collection Time: 12/21/19 12:00 PM   Specimen: BLOOD LEFT ARM  Result Value Ref Range Status   Specimen Description BLOOD LEFT ARM  Final   Special Requests   Final    BOTTLES DRAWN AEROBIC AND ANAEROBIC Blood Culture results may not be optimal due to an inadequate volume of blood received in culture bottles   Culture   Final    NO GROWTH 5 DAYS Performed at [facility], [place] [address] [place], [place] [address]    Report Status 12/26/2019 FINAL  Final        Oswald Hillock   [facility] If 7PM-7AM, please contact  night-coverage at [email or web address], Office  [phone]   12/26/2019, 1:56 PM  LOS: 5 days

## 2019-12-27 LAB — GLUCOSE, CAPILLARY: Glucose-Capillary: 83 mg/dL (ref 70–99)

## 2019-12-27 NOTE — Progress Notes (Signed)
[facility] in to transport pt to [facility]. Pt belonging given to [facility] personnel placed in pt belonging bag, eyeglasses and cell phone with charger, full  upper dentures and partial lower with pt.

## 2019-12-27 NOTE — TOC Transition Note (Signed)
Transition of Care [facility]) - CM/SW Discharge Note   Patient Details  Name: Jeremy Kelley MRN: [id number] Date of Birth: 1927/03/05  [facility] CM/SW Contact:  [address], [place] Phone Number: 12/27/2019, 9:11 AM   Clinical Narrative:    [facility]: [facility] Anticipated DC date: 12/27/19 Family notified: [name] Transport by: Corey Harold   Per MD patient ready for DC to . RN to call report prior to discharge ([phone]). RN, patient, patient's family, and facility notified of DC. Discharge Summary and FL2 sent to facility. DC packet on chart. Ambulance transport requested for patient.   CSW will sign off for now as social work intervention is no longer needed. Please consult [place] again if new needs arise.     Final next level of care: Assisted Living Barriers to Discharge: Barriers Resolved   Patient Goals and CMS Choice Patient states their goals for this hospitalization and ongoing recovery are:: Get better soon CMS Medicare.gov Compare Post Acute Care list provided to:: Patient Represenative (must comment) [name]) Choice offered to / list presented to : [place] / [place]  Discharge Placement              Patient chooses bed at: [facility] Patient to be transferred to facility by: [facility] Name of family member notified: [name] [phone] Patient and family notified of of transfer: 12/27/19  Discharge Plan and Services                                     Social Determinants of Health (SDOH) Interventions     Readmission Risk Interventions No flowsheet data found.

## 2019-12-27 NOTE — Progress Notes (Addendum)
Tried to call report to SNF but phone is not in service, [phone]). Tried another # given by CSW just ringing          [phone]).

## 2020-01-01 ENCOUNTER — Telehealth: Payer: Self-pay | Admitting: Adult Health

## 2020-01-01 NOTE — Telephone Encounter (Signed)
Spoke to patient's DPR and power of attorney I was informed that [name] died yesterday morning in hospice care  Condolences given

## 2020-01-10 ENCOUNTER — Telehealth: Payer: Self-pay | Admitting: Adult Health

## 2020-01-10 NOTE — Telephone Encounter (Signed)
Will Joycelyn Rua II is the patients Executive of state  He needs to know if the patient has a past due balance and would like for the statements to be sent to his address.   [place], [place] [address]  Will's number [phone]  Judith's number is [phone]  Please advise

## 2020-01-11 DEATH — deceased

## 2020-01-12 DIAGNOSIS — S71101D Unspecified open wound, right thigh, subsequent encounter: Secondary | ICD-10-CM | POA: Diagnosis not present

## 2020-01-21 NOTE — Telephone Encounter (Signed)
Pt's executive of state will have to call billing at [phone].

## 2021-07-20 IMAGING — DX DG ABD PORTABLE 1V
1 series · 1 of 1 positions shown · non-contrast
Comparison: CT abdomen and pelvis 02/21/2019

CLINICAL DATA: Small-bowel obstruction

EXAM:
PORTABLE ABDOMEN - 1 VIEW

[abdomen kub]
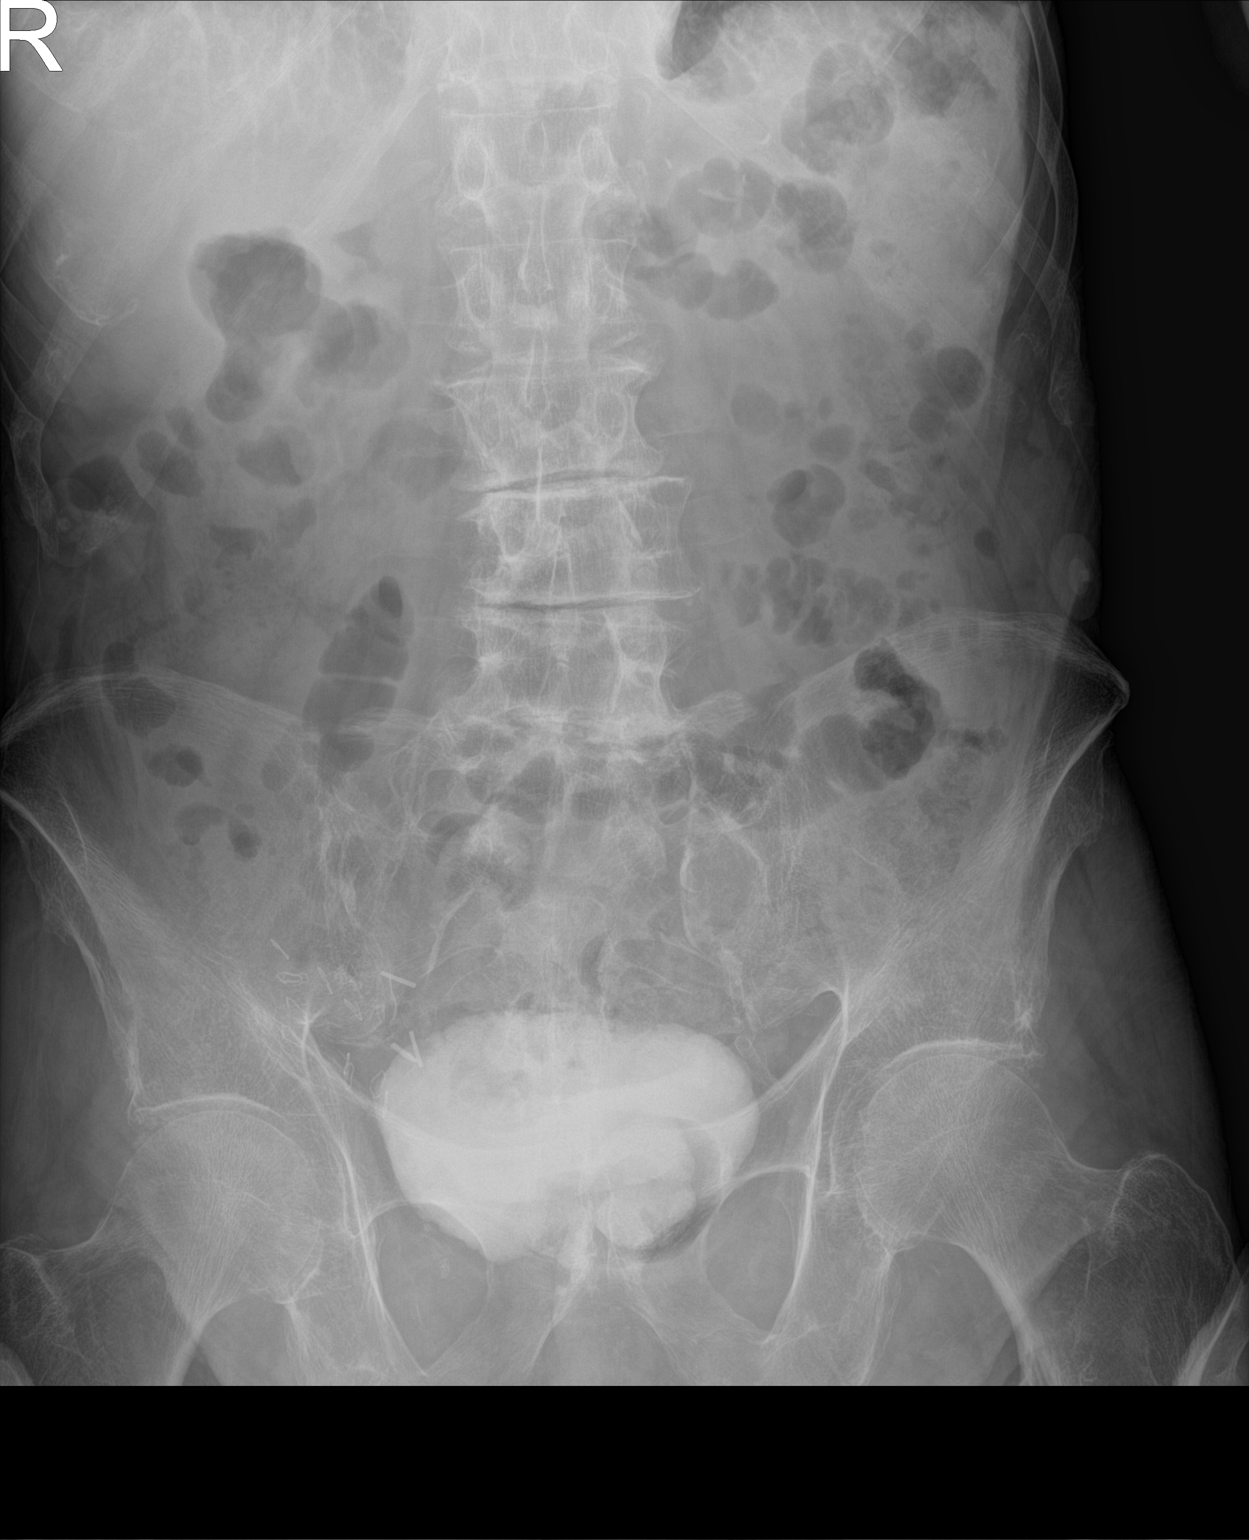

[1 of 1 positions shown; findings below may reference images not displayed]

FINDINGS: Normal bowel gas pattern.

No bowel dilatation or bowel wall thickening.

Stool present in rectum.

Stomach decompressed versus prior CT.

Excreted contrast present within urinary bladder.

Bones demineralized with degenerative changes and scoliosis of
lumbar spine.

Scattered atherosclerotic calcifications.

Surgical clips RIGHT pelvis.
IMPRESSION: Normal bowel gas pattern.

## 2021-08-20 IMAGING — DX DG CHEST 1V PORT
2 series · 2 of 2 positions shown · non-contrast
Comparison: None.

CLINICAL DATA: Preoperative chest radiograph

EXAM:
PORTABLE CHEST 1 VIEW

[chest ap (1 of 2)]
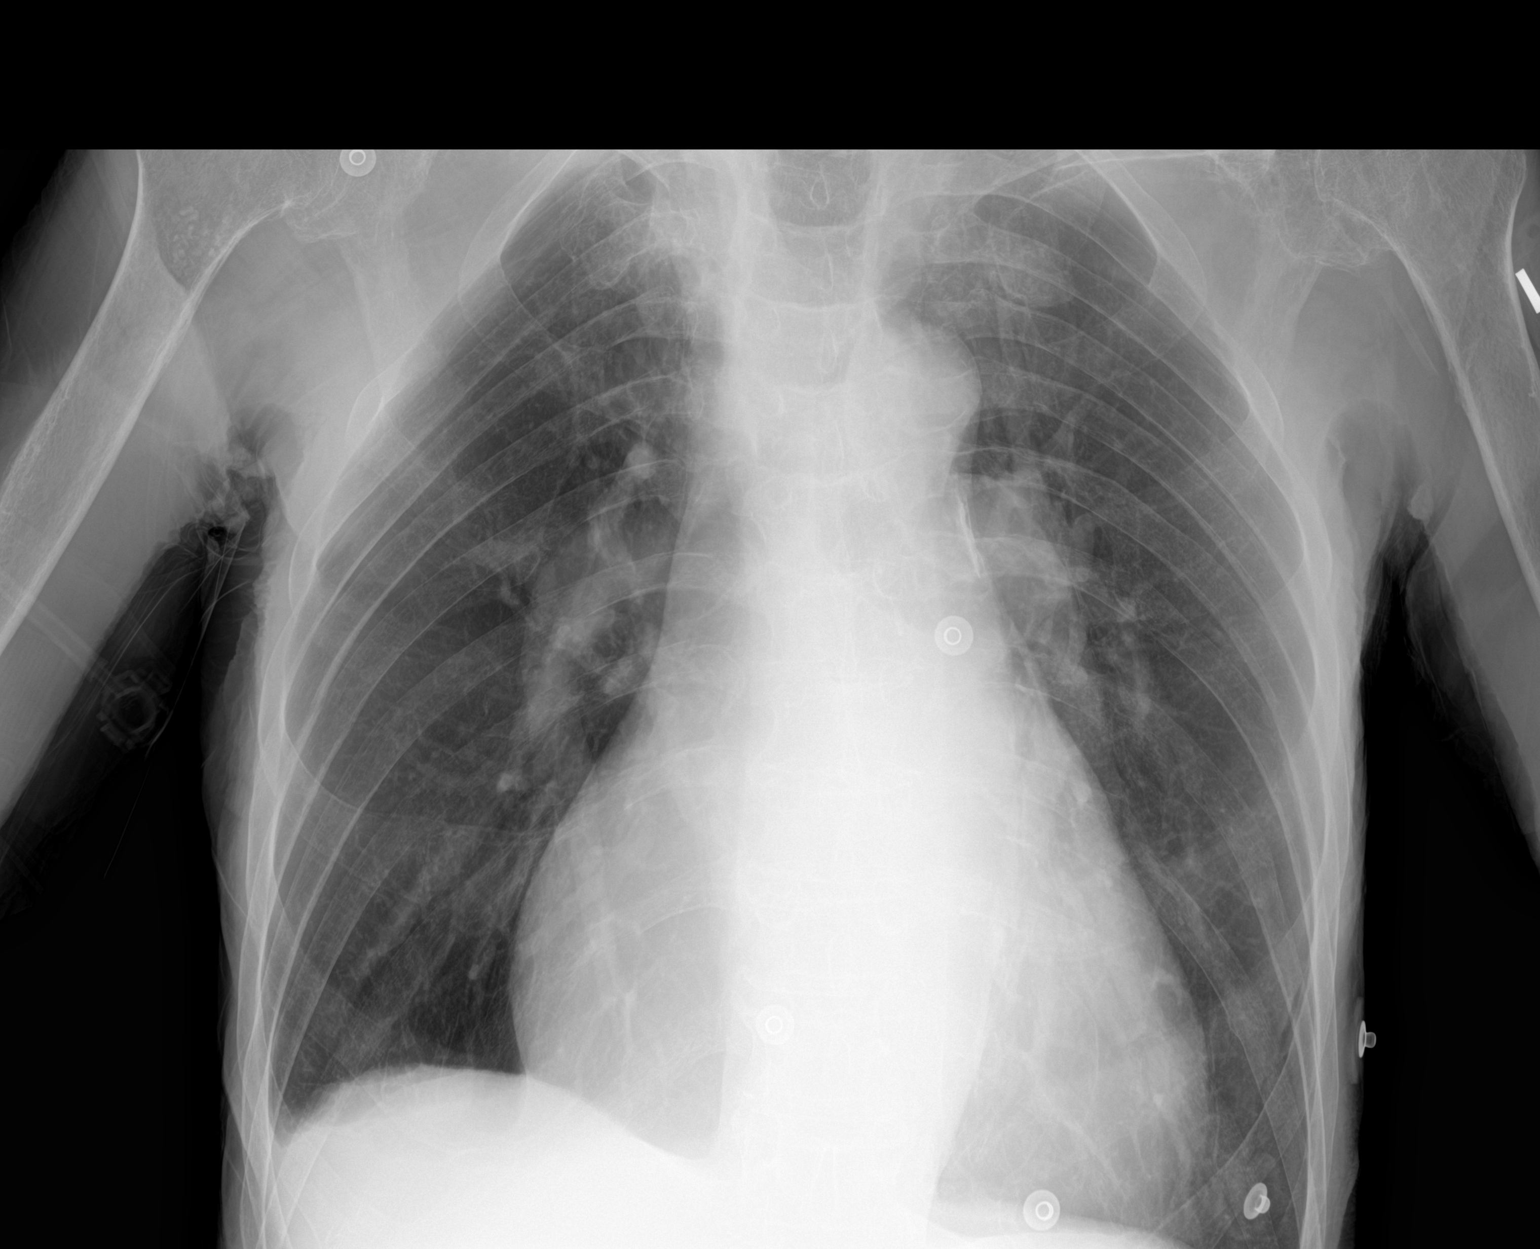

[chest ap (2 of 2)]
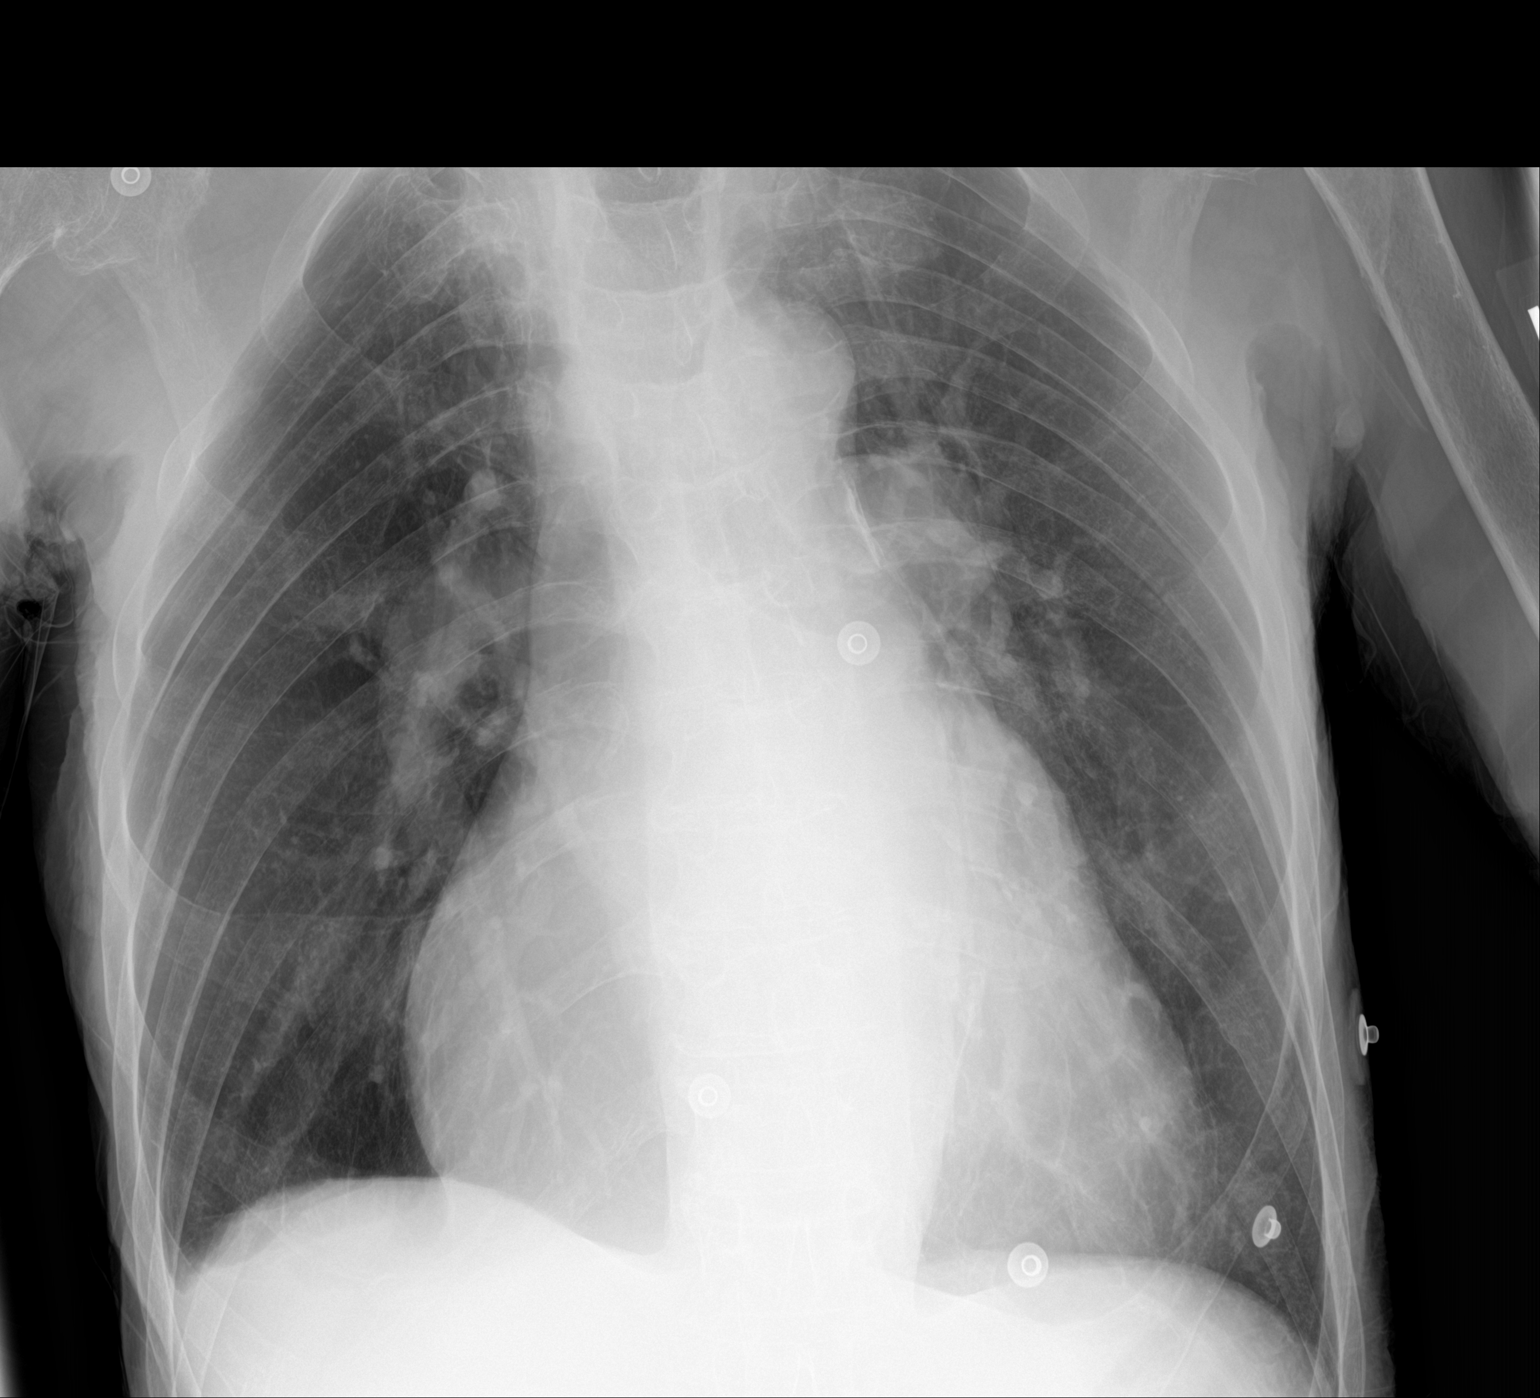

[2 of 2 positions shown; findings below may reference images not displayed]

FINDINGS: Moderate cardiomegaly. Lungs are mildly hyperinflated. Pulmonary
arteries are prominent. No sizable pleural effusion. No focal
airspace consolidation or pulmonary edema.
IMPRESSION: 1. No active cardiopulmonary disease.
2. Moderate cardiomegaly.

## 2021-08-20 IMAGING — CR DG LUMBAR SPINE COMPLETE 4+V
5 series · 5 of 5 positions shown · non-contrast
Comparison: None.

CLINICAL DATA: Fall, hip pain

EXAM:
LUMBAR SPINE - COMPLETE 4+ VIEW

[w lumbar spine lat (1 of 2)]
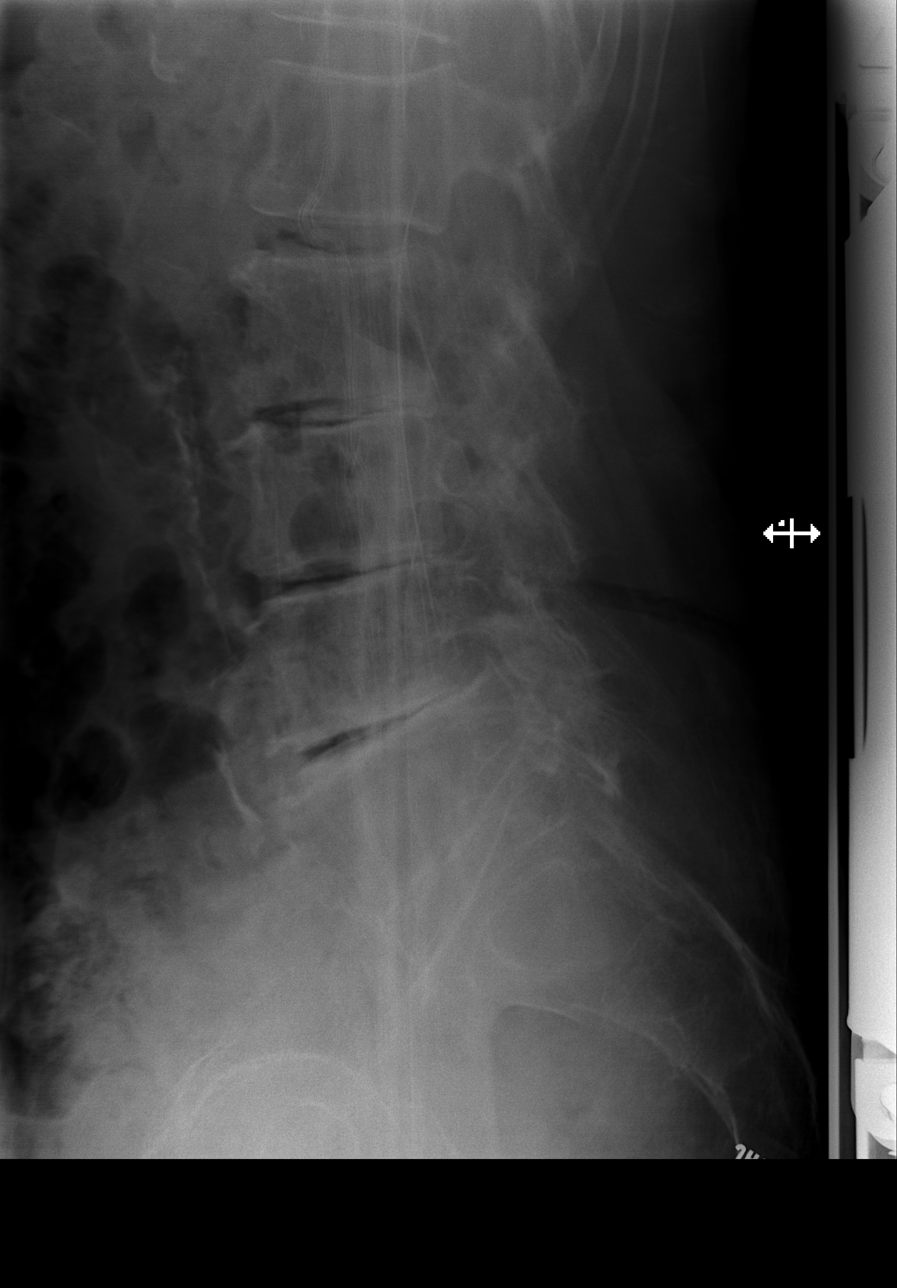

[w lumbar spine lat (2 of 2)]
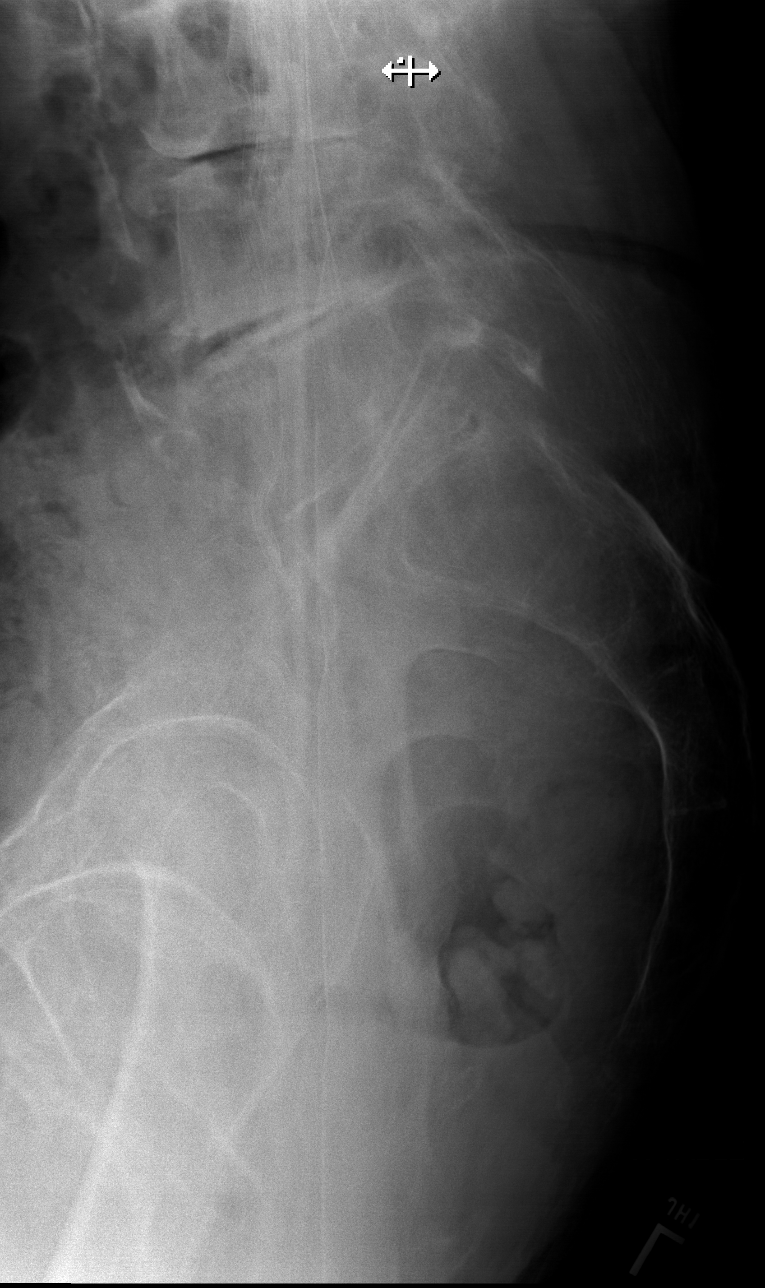

[t lumbar spine ap]
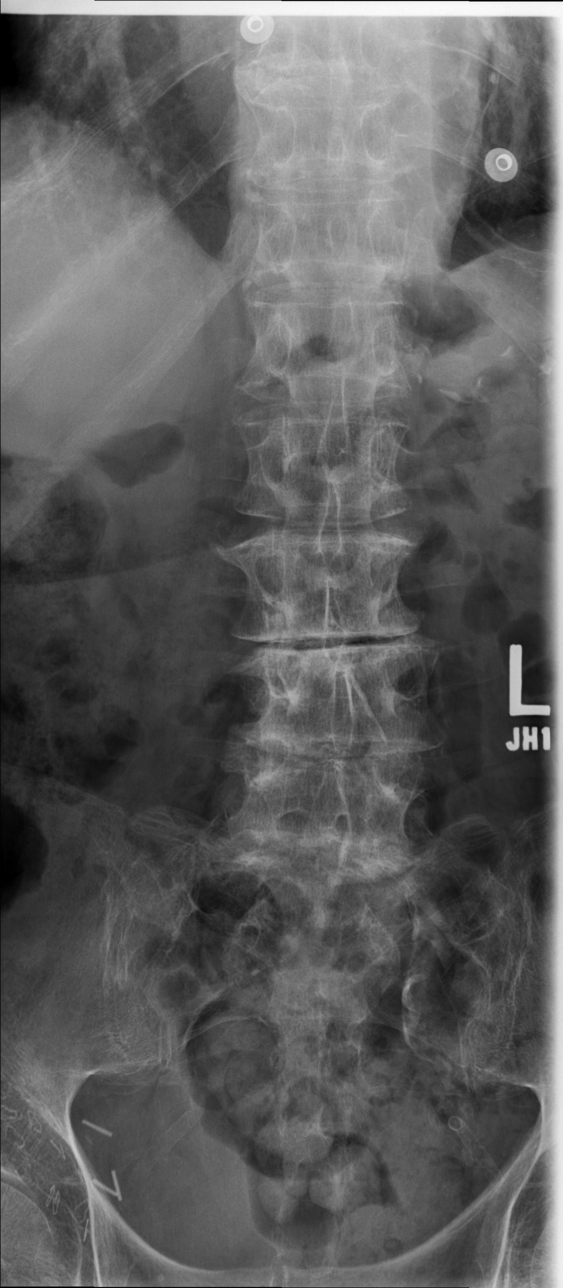

[t lumbar spine obl (1 of 2)]
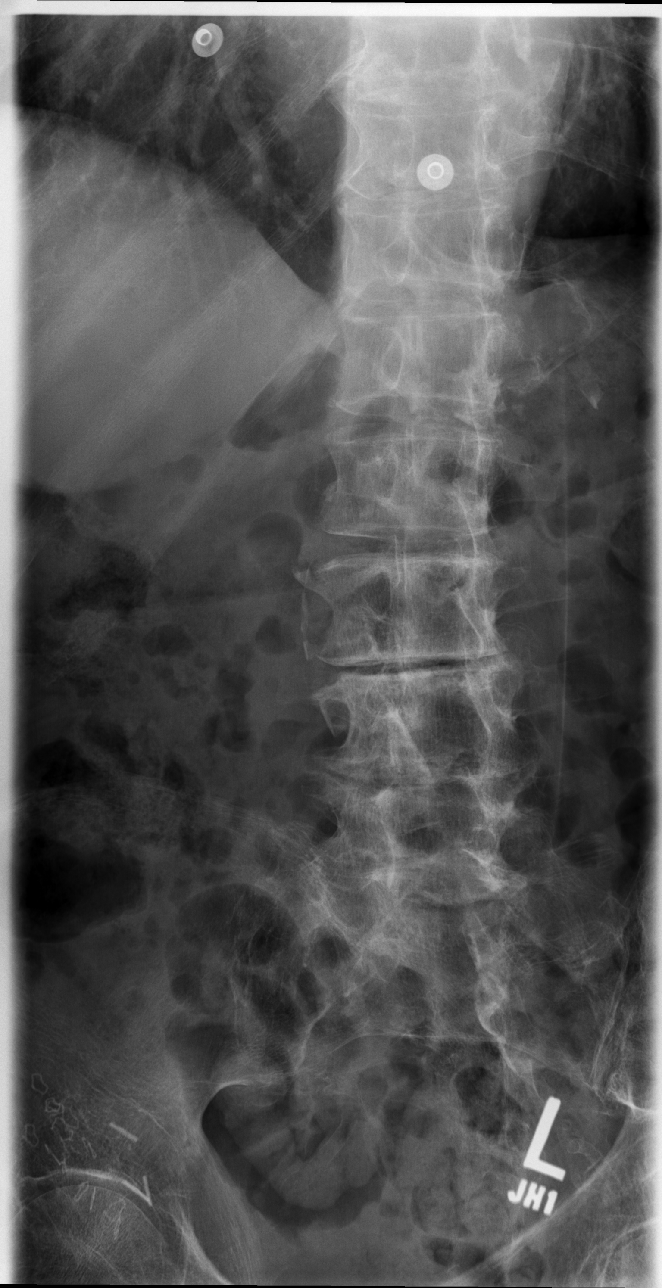

[t lumbar spine obl (2 of 2)]
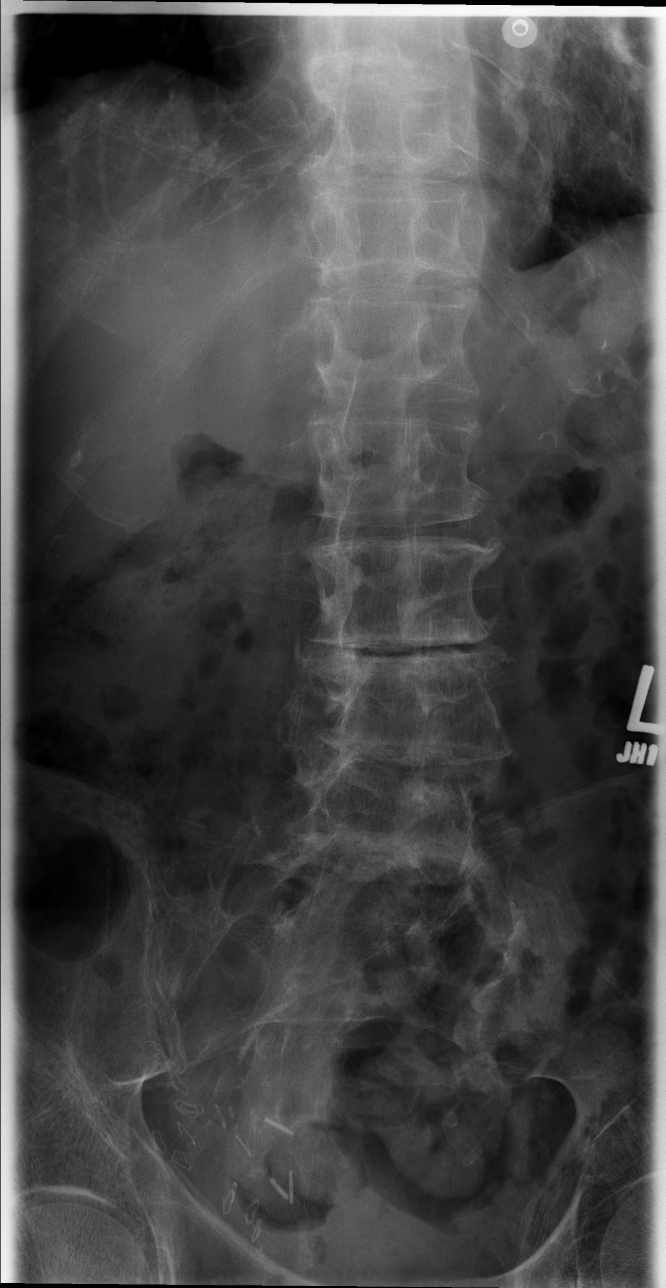

[5 of 5 positions shown; findings below may reference images not displayed]

FINDINGS: Normal alignment of the vertebral bodies. There is severe joint
space narrowing from L2-S1. No loss vertebral body height. Endplate
osteophytosis.
IMPRESSION: No acute findings lumbar spine.

Multilevel disc osteophytic disease and joint space narrowing.

## 2021-08-21 IMAGING — DX DG PORTABLE PELVIS
1 series · 1 of 1 positions shown · non-contrast
Comparison: None.

CLINICAL DATA: Status post left hip replacement.

EXAM:
PORTABLE PELVIS 1-2 VIEWS

[pelvis ap]
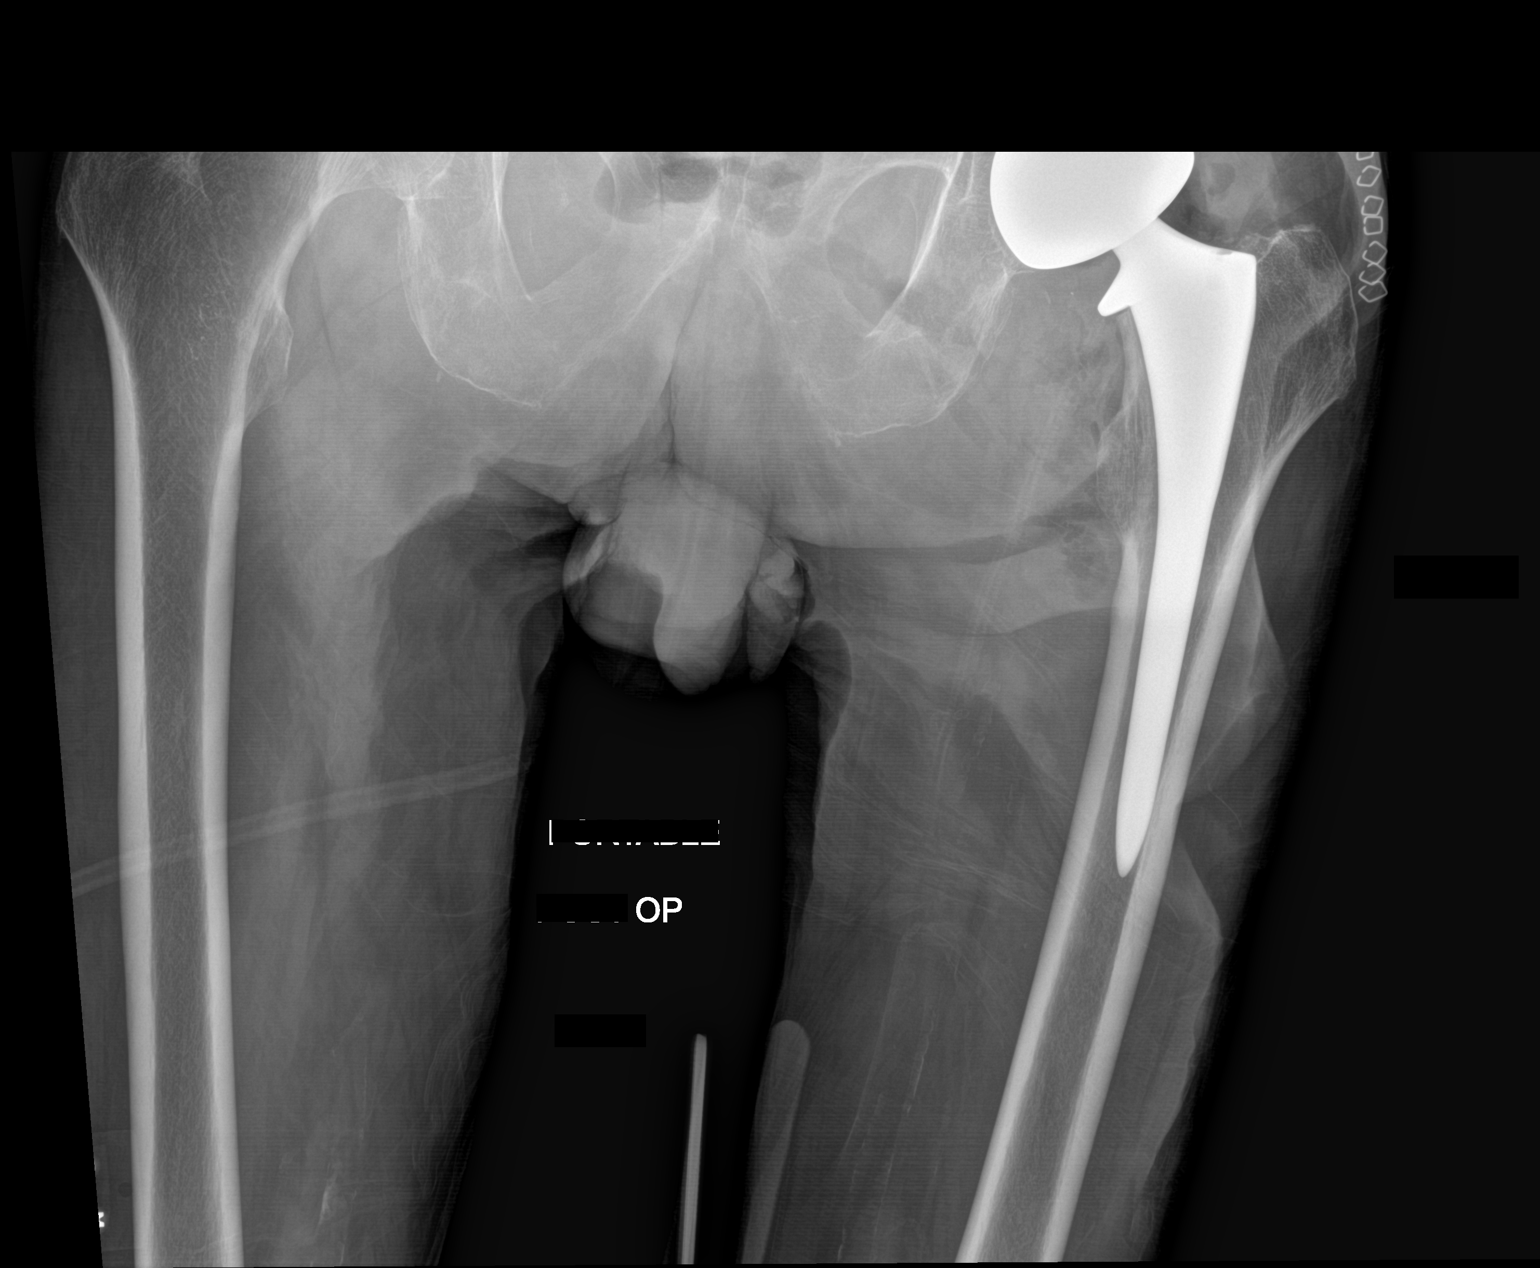

[1 of 1 positions shown; findings below may reference images not displayed]

FINDINGS: Left hip prosthesis in satisfactory position and alignment. No
fracture or dislocation seen. The bones are diffusely osteopenic.
IMPRESSION: Satisfactory appearance of a left hip prosthesis.
